# Patient Record
Sex: Male | Born: 1960 | Race: Black or African American | Hispanic: No | Marital: Single | Smoking: Former smoker
Health system: Southern US, Community
[De-identification: ages and names within clinical notes are randomized; demographics above are authoritative.]

## PROBLEM LIST (undated history)

## (undated) DIAGNOSIS — I10 Essential (primary) hypertension: Secondary | ICD-10-CM

## (undated) DIAGNOSIS — A020 Salmonella enteritis: Secondary | ICD-10-CM

## (undated) DIAGNOSIS — C8330 Diffuse large B-cell lymphoma, unspecified site: Secondary | ICD-10-CM

## (undated) DIAGNOSIS — K219 Gastro-esophageal reflux disease without esophagitis: Secondary | ICD-10-CM

## (undated) DIAGNOSIS — R310 Gross hematuria: Secondary | ICD-10-CM

## (undated) DIAGNOSIS — R0600 Dyspnea, unspecified: Secondary | ICD-10-CM

## (undated) DIAGNOSIS — D696 Thrombocytopenia, unspecified: Secondary | ICD-10-CM

## (undated) DIAGNOSIS — R8281 Pyuria: Secondary | ICD-10-CM

## (undated) DIAGNOSIS — Z978 Presence of other specified devices: Secondary | ICD-10-CM

## (undated) DIAGNOSIS — D649 Anemia, unspecified: Secondary | ICD-10-CM

## (undated) DIAGNOSIS — Z95828 Presence of other vascular implants and grafts: Secondary | ICD-10-CM

## (undated) DIAGNOSIS — N368 Other specified disorders of urethra: Secondary | ICD-10-CM

## (undated) HISTORY — PX: PORTA CATH INSERTION: CATH118285

## (undated) HISTORY — DX: Salmonella enteritis: A02.0

## (undated) HISTORY — PX: COLONOSCOPY: SHX174

## (undated) HISTORY — PX: BONE MARROW BIOPSY: SHX199

## (undated) HISTORY — DX: Gastro-esophageal reflux disease without esophagitis: K21.9

## (undated) HISTORY — DX: Essential (primary) hypertension: I10

## (undated) HISTORY — PX: HERNIA REPAIR: SHX51

---

## 2012-04-24 ENCOUNTER — Emergency Department: Payer: Self-pay | Admitting: Emergency Medicine

## 2012-04-24 IMAGING — CR DG ABDOMEN 2V
1 series · 3 of 3 positions shown · non-contrast
Comparison: none

REASON FOR EXAM: CONSTIPATION
COMMENTS:   May transport without cardiac monitor

PROCEDURE:     DXR - DXR ABDOMEN 2 V FLAT AND ERECT  - [DATE]  [DATE]
RESULT:     Comparison: None.

[Series 1: w abdomen upright · 0.14mm/px · 3 of 3 slices shown]
[im 1/3]
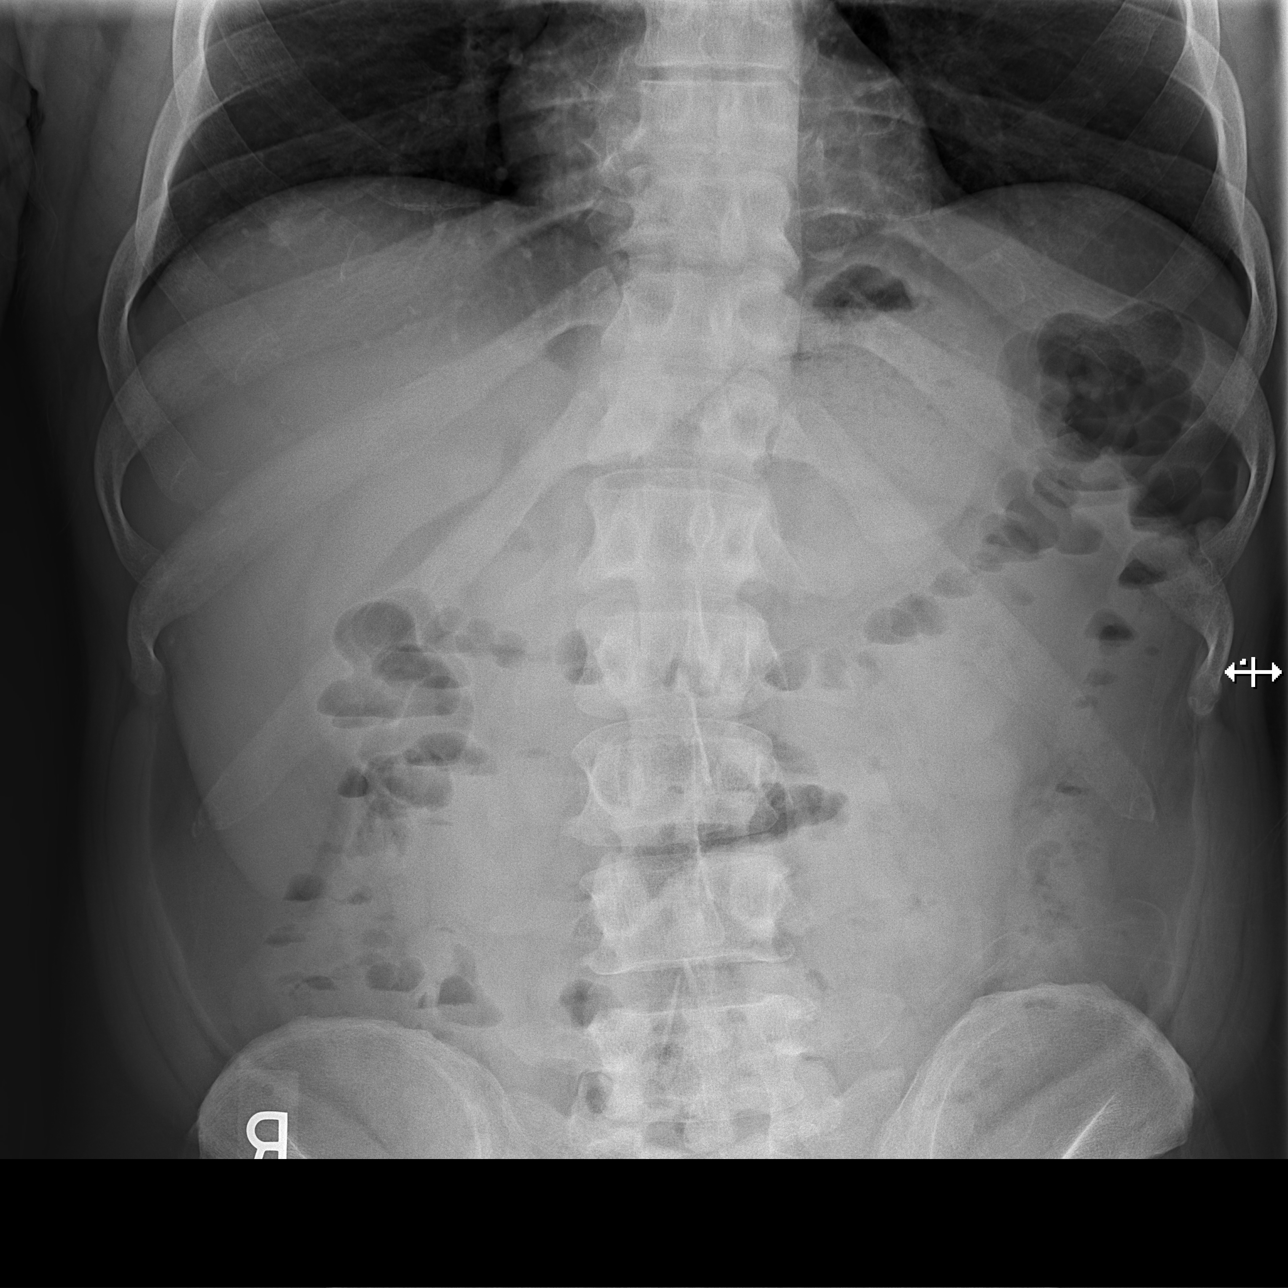
[im 2/3]
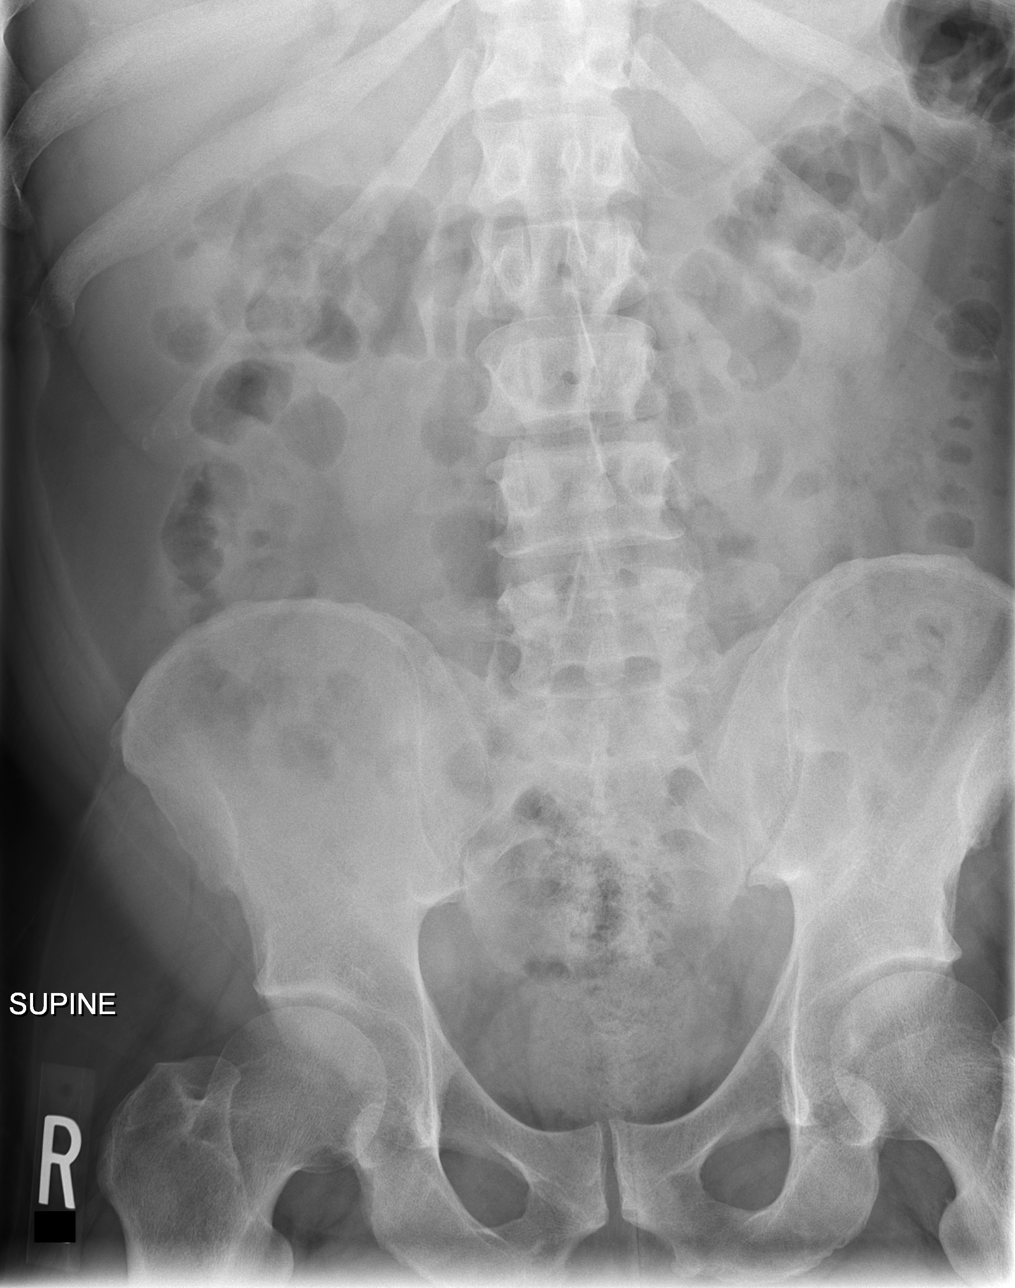
[im 3/3]
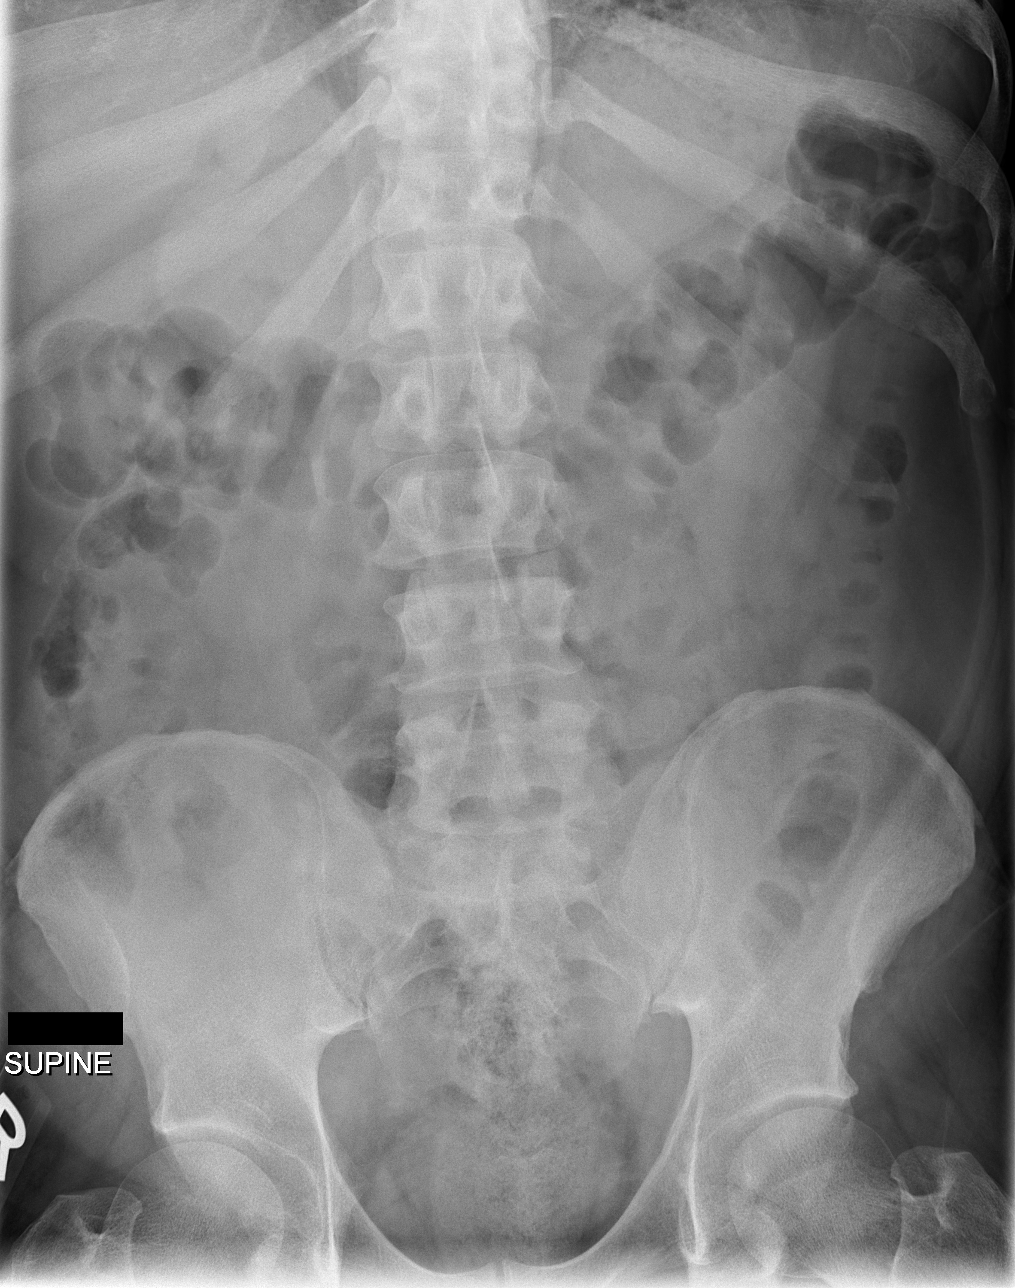

[3 of 3 positions shown; findings below may reference images not displayed]

FINDINGS: No free intraperitoneal. Air seen within nondilated small and large bowel.
Subcentimeter nodular density overlying the right lower lung was seen on the
chest radiograph and felt to represent a nipple shadow.
IMPRESSION: Nonobstructed bowel gas pattern.

[REDACTED]

## 2012-04-24 IMAGING — CR DG CHEST 1V
1 series · 1 of 1 positions shown · non-contrast
Comparison: none

REASON FOR EXAM: UPRIGHT, CONSTIPATION
COMMENTS:   May transport without cardiac monitor

PROCEDURE:     DXR - DXR CHEST 1 VIEWAP OR PA  - [DATE]  [DATE]
RESULT:     Comparison: None.

[w chest pa]
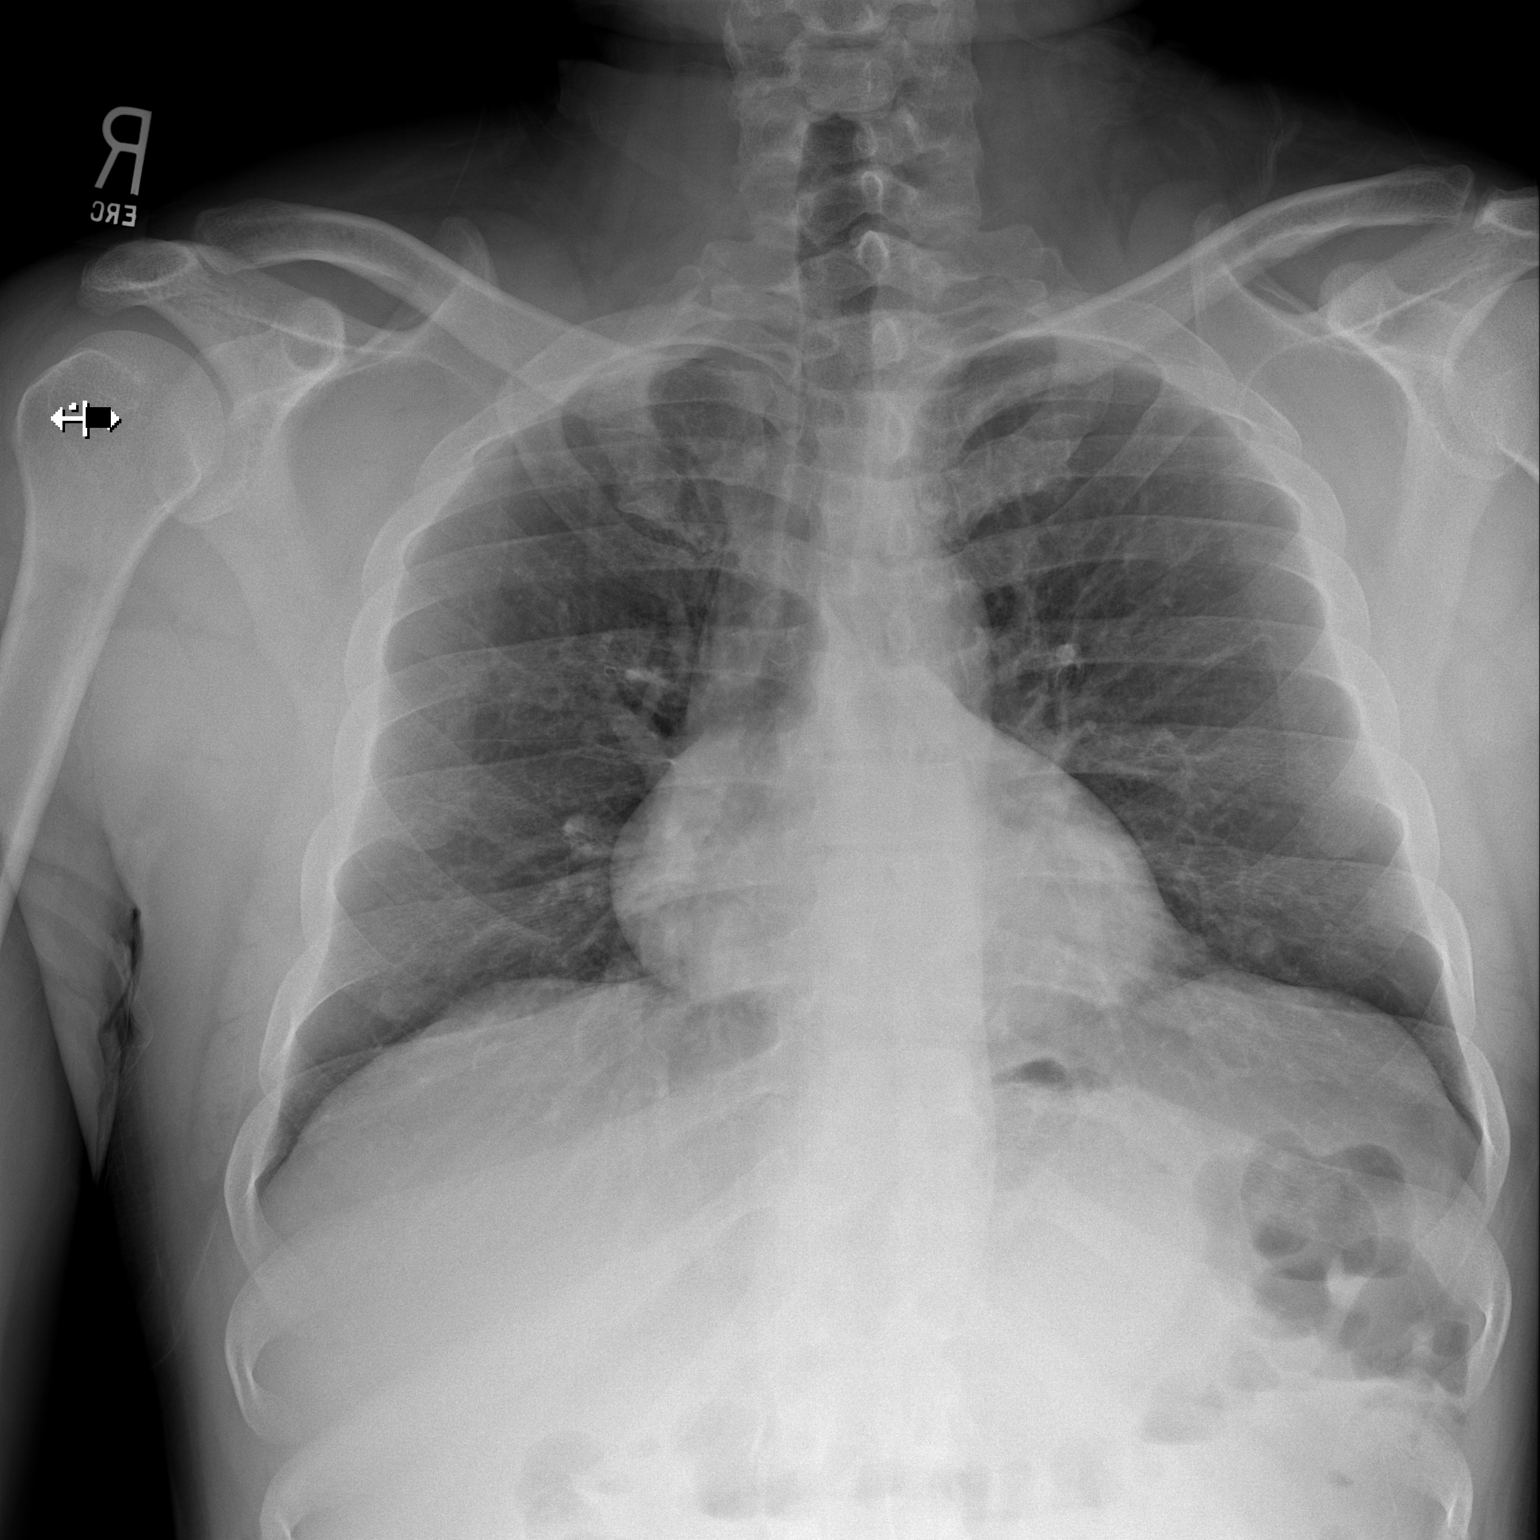

[1 of 1 positions shown; findings below may reference images not displayed]

FINDINGS: The heart and mediastinum are within normal limits, given the lower lung
volumes. Subcentimeter nodular densities overlying the lower lungs are
likely secondary to nipple shadows. No focal pulmonary opacities.
IMPRESSION: No acute cardiopulmonary disease.

[REDACTED]

## 2013-02-19 ENCOUNTER — Emergency Department: Payer: Self-pay

## 2013-02-19 LAB — COMPREHENSIVE METABOLIC PANEL
Albumin: 4.2 g/dL (ref 3.4–5.0)
Alkaline Phosphatase: 79 U/L
Anion Gap: 6 — ABNORMAL LOW (ref 7–16)
BUN: 16 mg/dL (ref 7–18)
Bilirubin,Total: 0.4 mg/dL (ref 0.2–1.0)
Calcium, Total: 9.6 mg/dL (ref 8.5–10.1)
Chloride: 102 mmol/L (ref 98–107)
Co2: 27 mmol/L (ref 21–32)
Creatinine: 1.12 mg/dL (ref 0.60–1.30)
EGFR (African American): >60
EGFR (Non-African Amer.): >60
Glucose: 94 mg/dL (ref 65–99)
Osmolality: 271 (ref 275–301)
Potassium: 3.5 mmol/L (ref 3.5–5.1)
SGOT(AST): 33 U/L (ref 15–37)
SGPT (ALT): 41 U/L (ref 12–78)
Sodium: 135 mmol/L — ABNORMAL LOW (ref 136–145)
Total Protein: 7.9 g/dL (ref 6.4–8.2)

## 2013-02-19 LAB — CBC
HCT: 40.1 % (ref 40.0–52.0)
HGB: 13.9 g/dL (ref 13.0–18.0)
MCH: 29.9 pg (ref 26.0–34.0)
MCHC: 34.8 g/dL (ref 32.0–36.0)
MCV: 86 fL (ref 80–100)
Platelet: 274 10*3/uL (ref 150–440)
RBC: 4.66 10*6/uL (ref 4.40–5.90)
RDW: 14 % (ref 11.5–14.5)
WBC: 7 10*3/uL (ref 3.8–10.6)

## 2013-02-19 LAB — TROPONIN I: Troponin-I: <0.02 ng/mL

## 2013-02-19 IMAGING — CR DG CHEST 2V
1 series · 3 of 3 positions shown · non-contrast
Comparison: None.

CLINICAL DATA: Chest pain

EXAM:
CHEST  2 VIEW

[Series 1: w chest pa · 0.14mm/px · 3 of 3 slices shown]
[im 1/3]
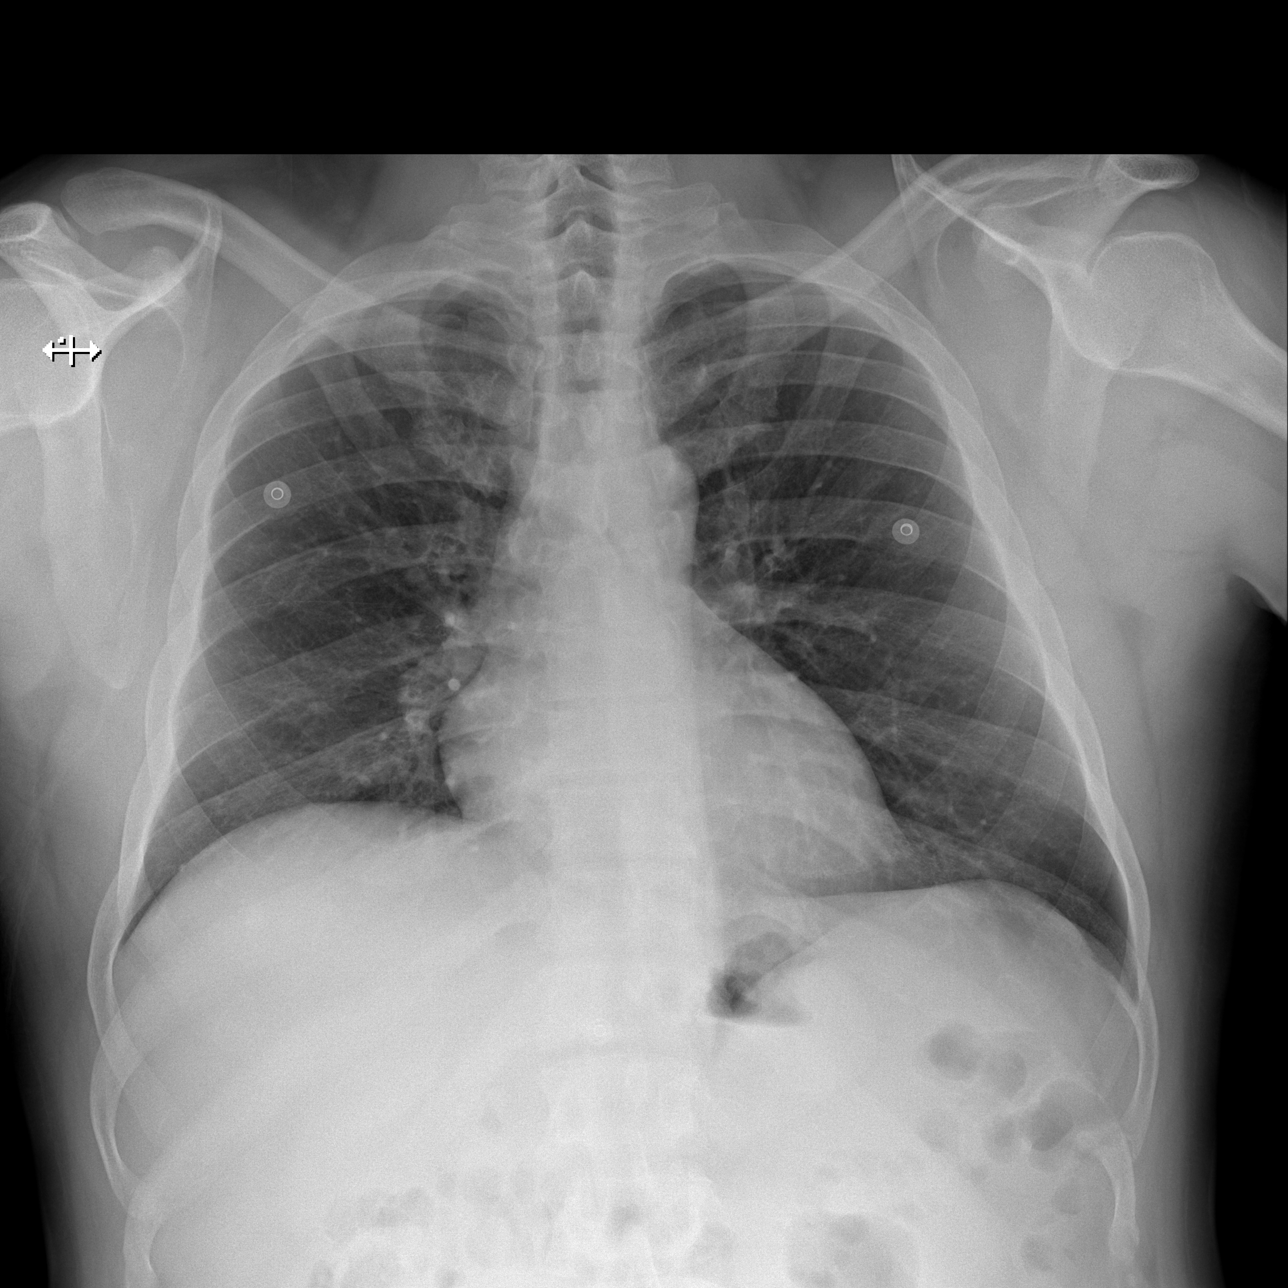
[im 2/3]
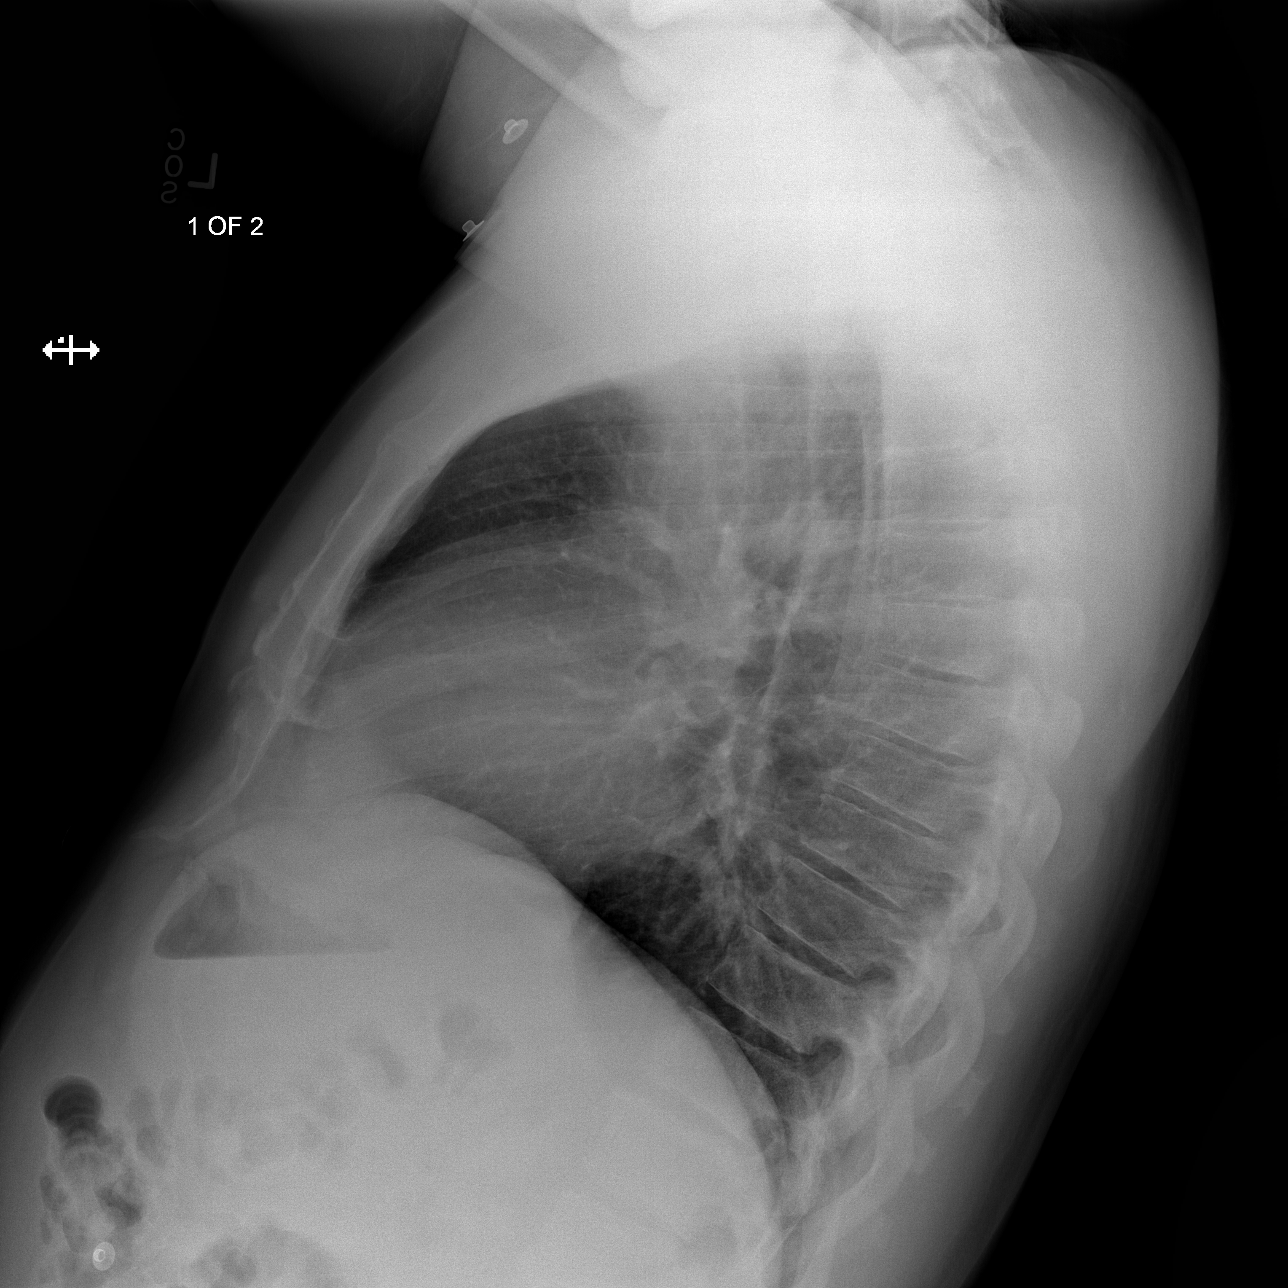
[im 3/3]
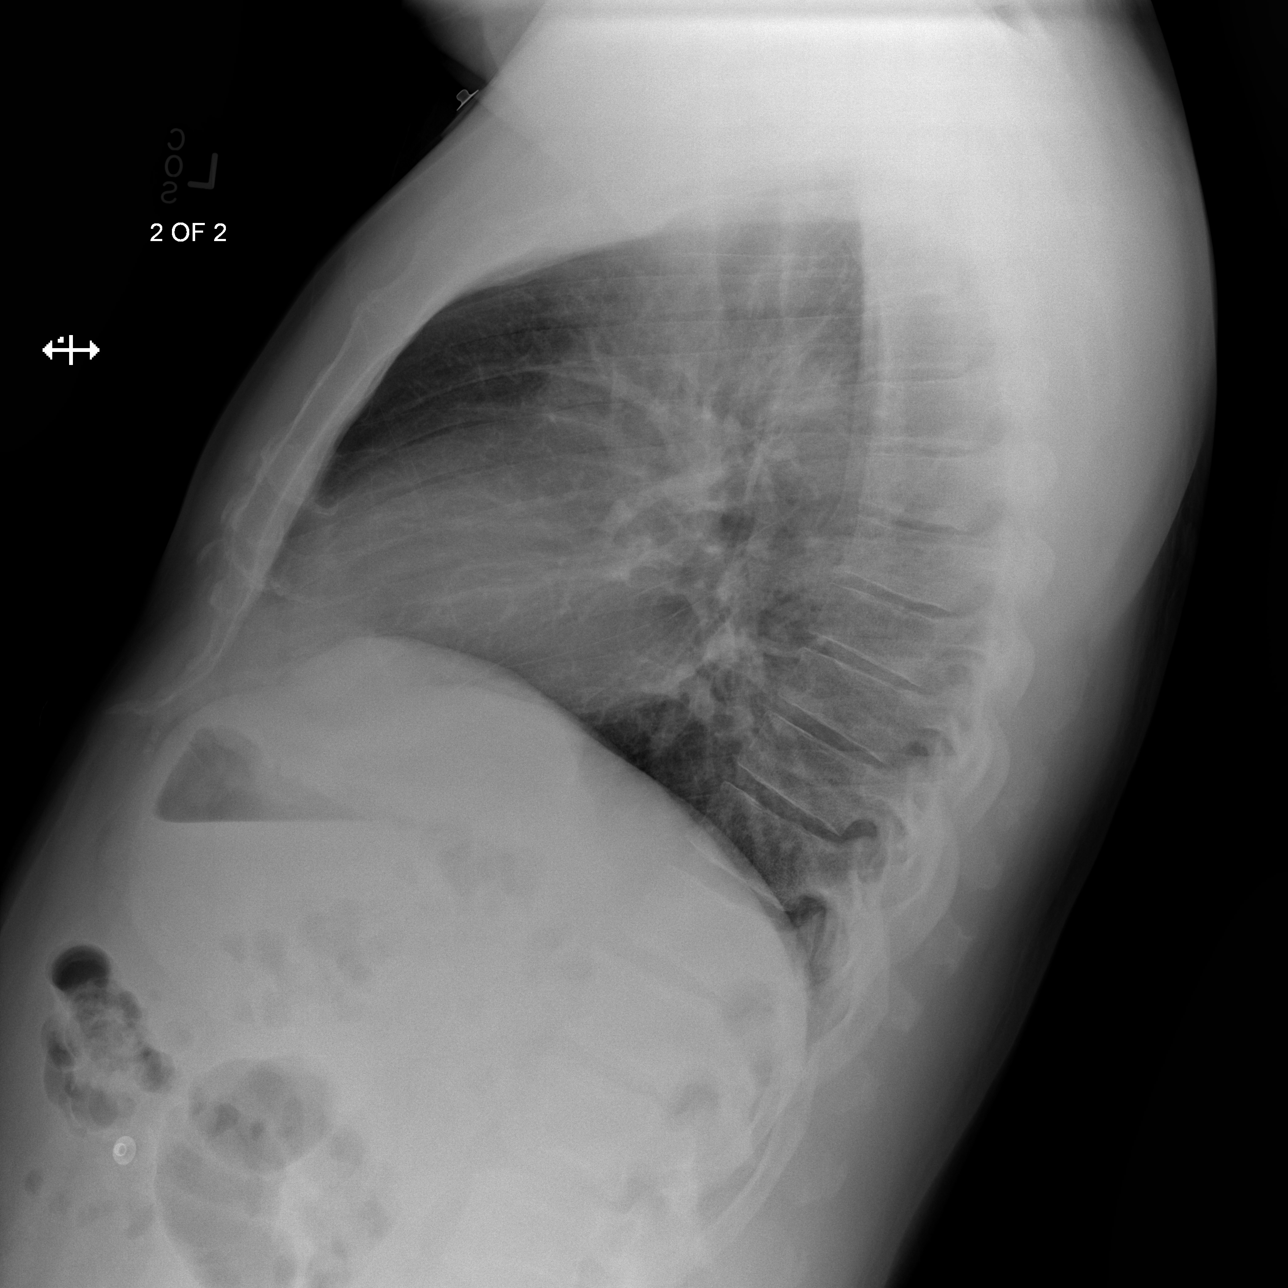

[3 of 3 positions shown; findings below may reference images not displayed]

FINDINGS: The heart size and mediastinal contours are within normal limits.
Both lungs are clear. The visualized skeletal structures are
unremarkable. Low lung volumes.
IMPRESSION: No active cardiopulmonary disease.

## 2017-05-21 ENCOUNTER — Other Ambulatory Visit: Payer: Self-pay

## 2017-05-21 ENCOUNTER — Emergency Department
Admission: EM | Admit: 2017-05-21 | Discharge: 2017-05-21 | Disposition: A | Discharge status: Home / Self Care | Payer: Self-pay | Attending: Emergency Medicine | Admitting: Emergency Medicine

## 2017-05-21 ENCOUNTER — Emergency Department: Payer: Self-pay

## 2017-05-21 DIAGNOSIS — R0789 Other chest pain: Secondary | ICD-10-CM

## 2017-05-21 DIAGNOSIS — R079 Chest pain, unspecified: Secondary | ICD-10-CM | POA: Insufficient documentation

## 2017-05-21 LAB — CBC
HCT: 39.9 % — ABNORMAL LOW (ref 40.0–52.0)
Hemoglobin: 14 g/dL (ref 13.0–18.0)
MCH: 31.1 pg (ref 26.0–34.0)
MCHC: 35 g/dL (ref 32.0–36.0)
MCV: 88.7 fL (ref 80.0–100.0)
Platelets: 269 10*3/uL (ref 150–440)
RBC: 4.5 MIL/uL (ref 4.40–5.90)
RDW: 13.7 % (ref 11.5–14.5)
WBC: 5.1 10*3/uL (ref 3.8–10.6)

## 2017-05-21 LAB — BASIC METABOLIC PANEL
Anion gap: 10 (ref 5–15)
BUN: 14 mg/dL (ref 6–20)
CO2: 24 mmol/L (ref 22–32)
Calcium: 9.6 mg/dL (ref 8.9–10.3)
Chloride: 105 mmol/L (ref 101–111)
Creatinine, Ser: 1.16 mg/dL (ref 0.61–1.24)
GFR calc Af Amer: >60 mL/min (ref >60)
GFR calc non Af Amer: >60 mL/min (ref >60)
Glucose, Bld: 137 mg/dL — ABNORMAL HIGH (ref 65–99)
Potassium: 3.6 mmol/L (ref 3.5–5.1)
Sodium: 139 mmol/L (ref 135–145)

## 2017-05-21 LAB — HEPATIC FUNCTION PANEL
ALT: 33 U/L (ref 17–63)
AST: 26 U/L (ref 15–41)
Albumin: 4.3 g/dL (ref 3.5–5.0)
Alkaline Phosphatase: 74 U/L (ref 38–126)
Bilirubin, Direct: <0.1 mg/dL — ABNORMAL LOW (ref 0.1–0.5)
Total Bilirubin: 0.5 mg/dL (ref 0.3–1.2)
Total Protein: 7.2 g/dL (ref 6.5–8.1)

## 2017-05-21 LAB — TROPONIN I
Troponin I: <0.03 ng/mL (ref <0.03)
Troponin I: <0.03 ng/mL (ref <0.03)

## 2017-05-21 LAB — LIPASE, BLOOD: Lipase: 44 U/L (ref 11–51)

## 2017-05-21 IMAGING — CR DG CHEST 2V
1 series · 2 of 2 positions shown · non-contrast
Comparison: [DATE]

CLINICAL DATA: Smoker with chest pain

EXAM:
CHEST - 2 VIEW

[Series 1: dg chest 2 view · 0.14mm/px · 2 of 2 slices shown]
[im 1/2]
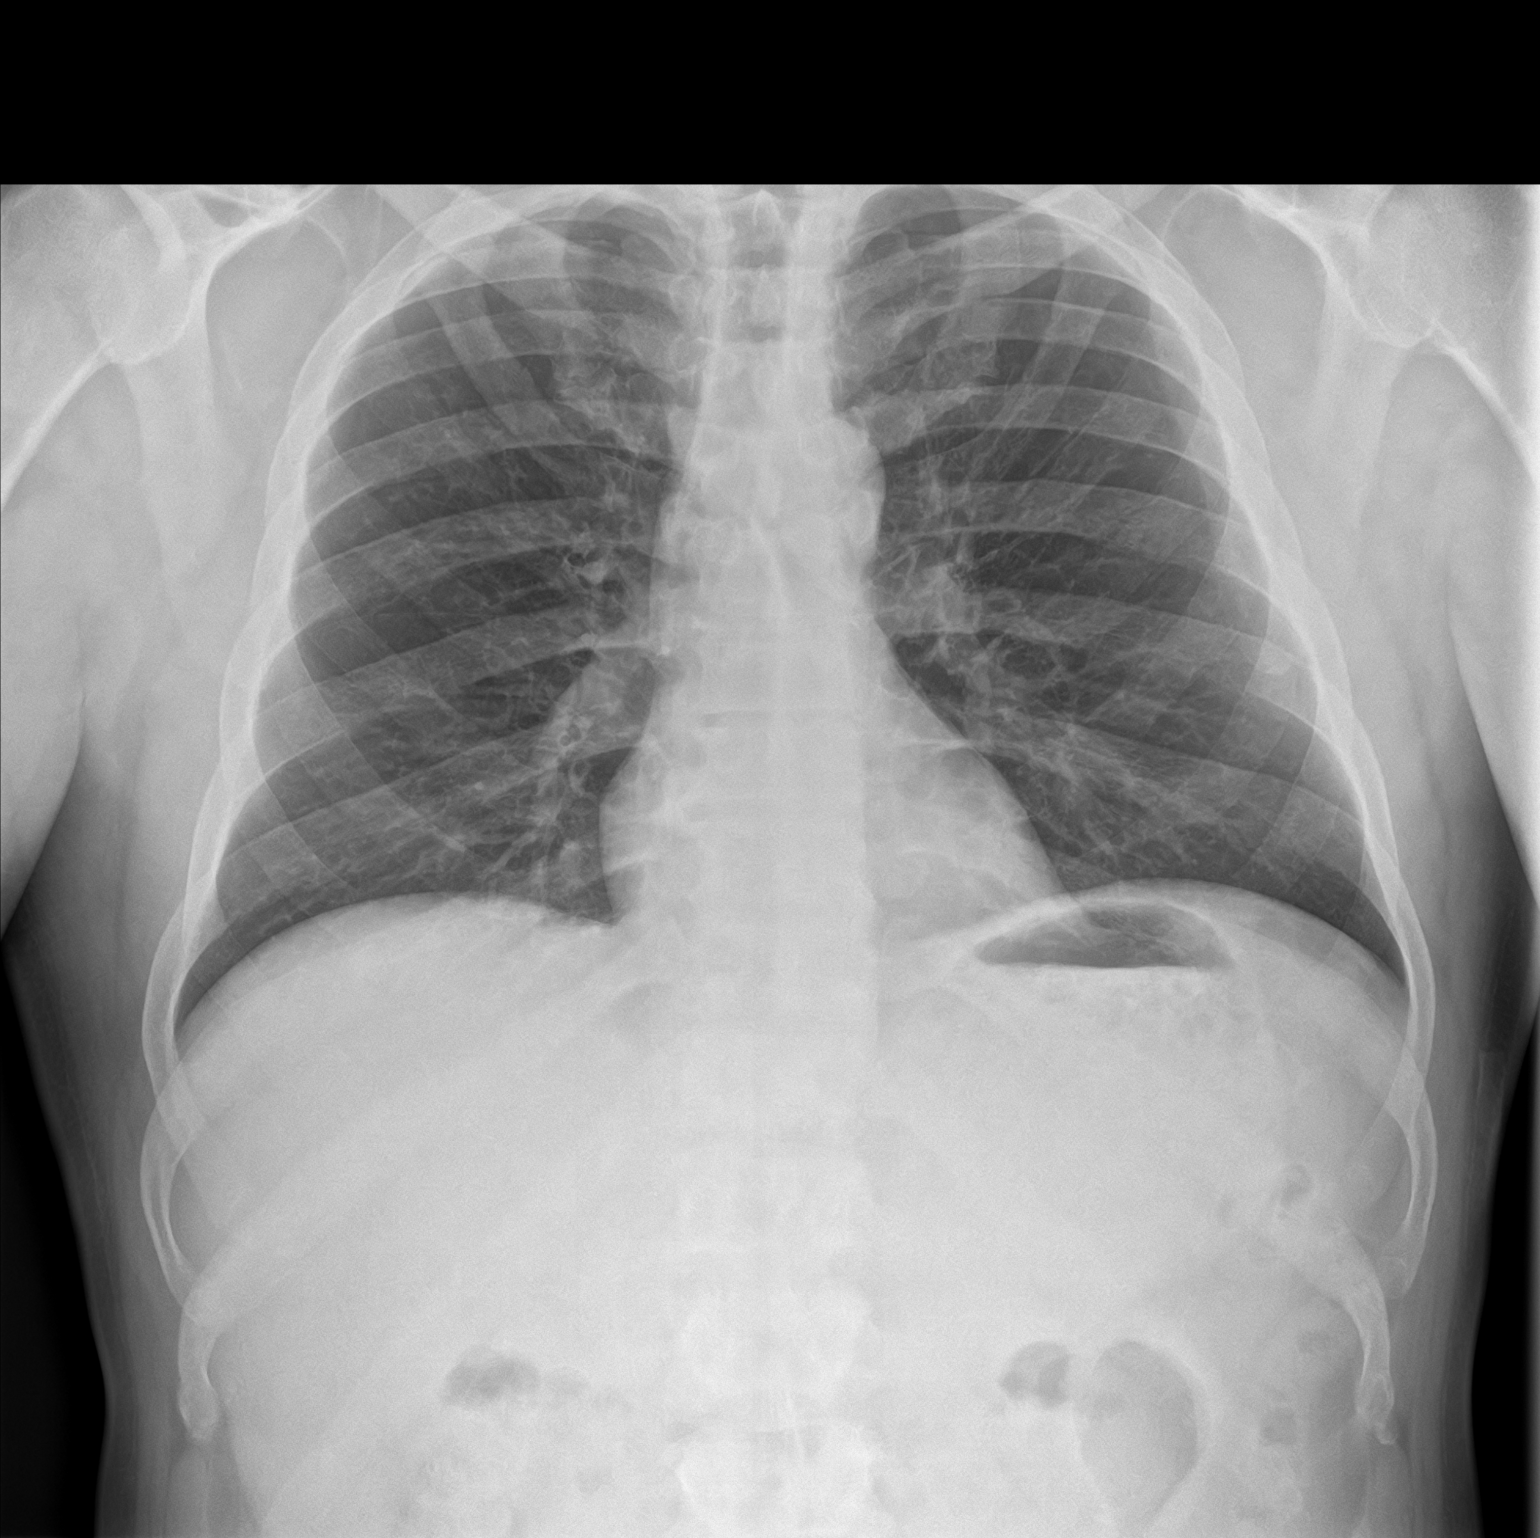
[im 2/2]
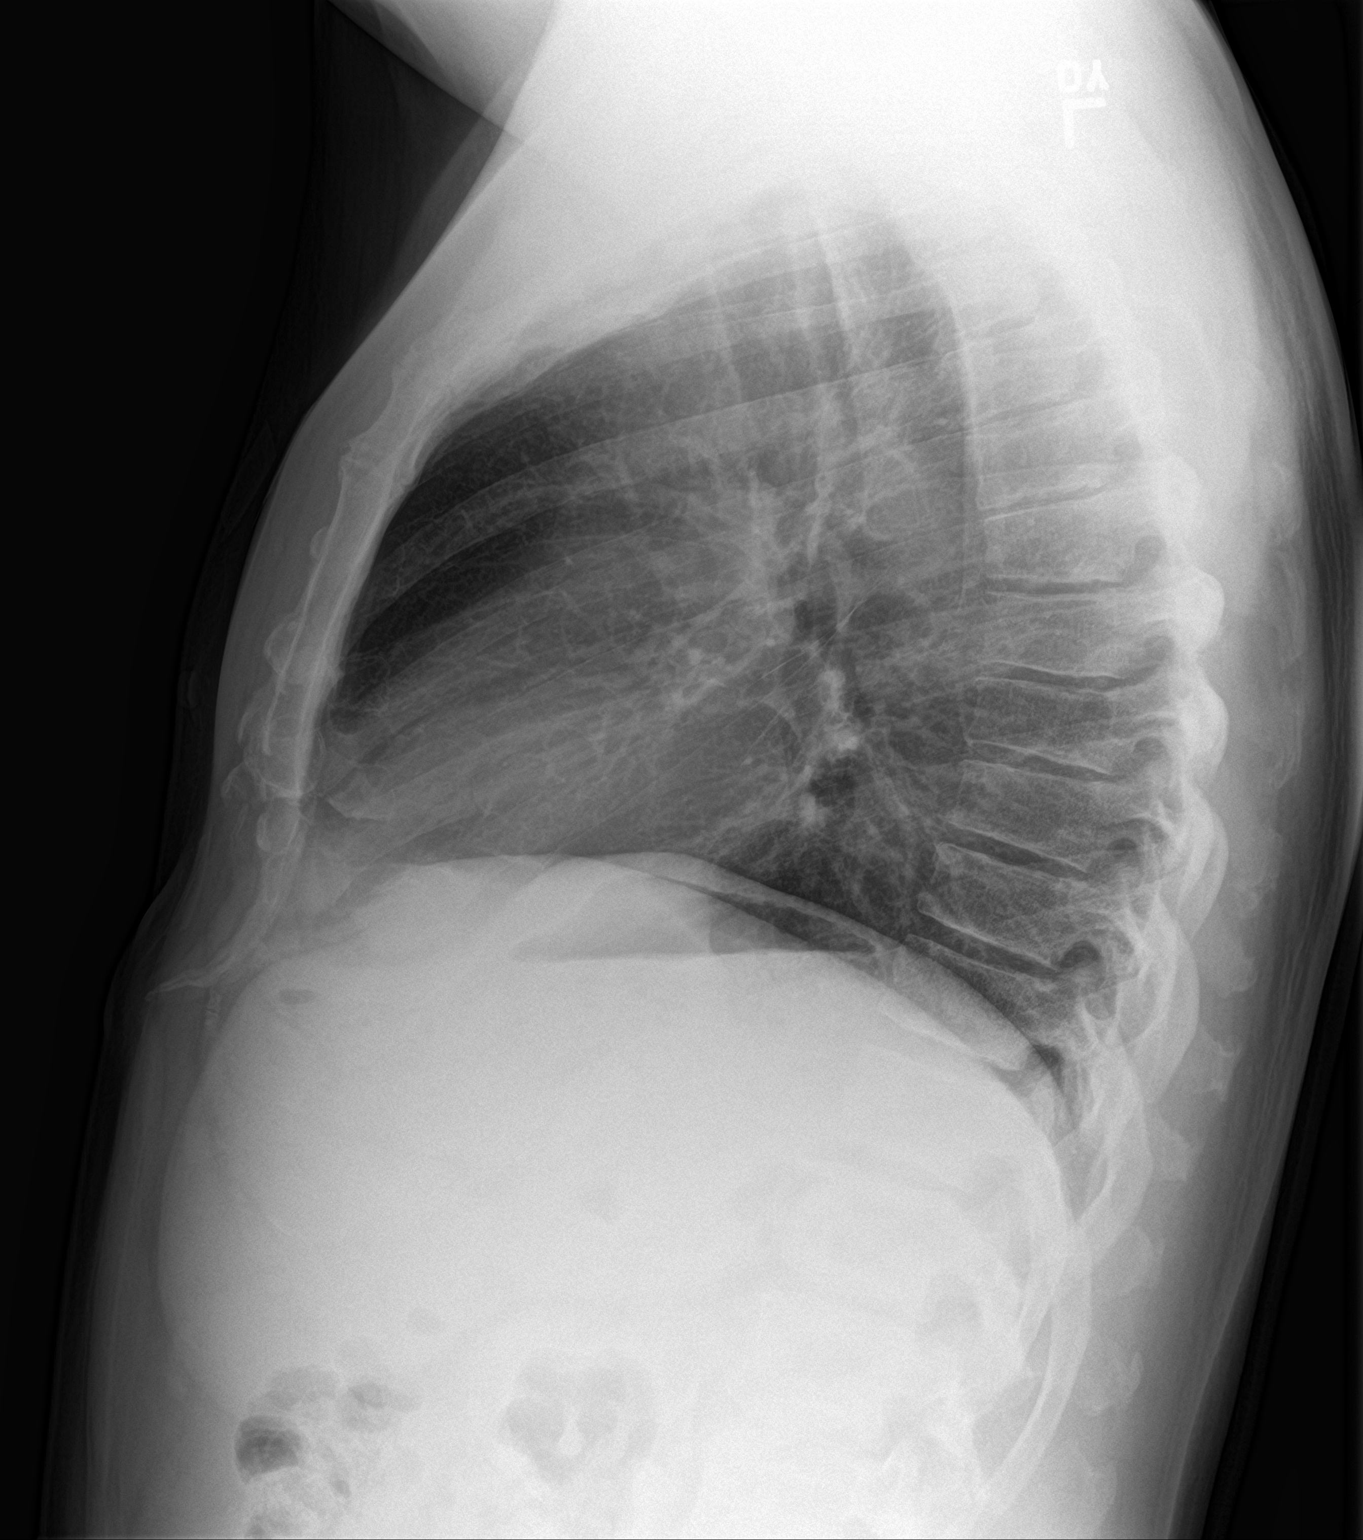

[2 of 2 positions shown; findings below may reference images not displayed]

FINDINGS: The heart size and mediastinal contours are within normal limits.
Both lungs are clear. The visualized skeletal structures are
unremarkable.
IMPRESSION: No active cardiopulmonary disease.

## 2017-05-21 NOTE — ED Provider Notes (Signed)
[facility] Emergency Department Provider Note  ____________________________________________   I have reviewed the triage vital signs and the nursing notes. Where available I have reviewed prior notes and, if possible and indicated, outside hospital notes.    HISTORY  Chief Complaint Chest Pain    HPI Christopher Mills is a 57 y.o. male he does not have any known risk factors for CAD he states does drink a fair amount of alcohol on the weekends but never had any withdrawal.  He states he has been having a burning discomfort in his chest off and on for a month.  Nothing makes it better except for antiacids is worse when he drinks alcohol or when he eats spicy food.  He states he has had no fever or chills, no exertional symptoms, he has not had any radiation of the discomfort.  It all started when he had a cough about a month ago, the cough is gone.  He does not have any pleuritic pain.  He states now and again he has a discomfort there that gets better when he takes acid reflux medication.  And seems to get worse when he eats spicy food.  He is not actually having the pain now he has a day tonight but he wants to be checked out.  He denies any leg swelling, he denies any orthopnea or paroxysmal nocturnal dyspnea, he denies any decreased ability to do his activities of daily living or exertional symptoms of any variety.  Patient is a very limited historian in some ways, this is the best that I can gather from him.  The pain is nonradiating, he has no personal or family history of PE or DVT no recent travel no recent surgeries.   No past medical history on file.    Prior to Admission medications   Not on File    Allergies Patient has no known allergies.  No family history on file.  Social History Social History   Tobacco Use  . Smoking status: Not on file  Substance Use Topics  . Alcohol use: Not on file  . Drug use: Not on file    Review of  Systems Constitutional: No fever/chills Eyes: No visual changes. ENT: No sore throat. No stiff neck no neck pain Cardiovascular: See HPI chest pain. Respiratory: Denies shortness of breath. Gastrointestinal:   no vomiting.  No diarrhea.  No constipation. Genitourinary: Negative for dysuria. Musculoskeletal: Negative lower extremity swelling Skin: Negative for rash. Neurological: Negative for severe headaches, focal weakness or numbness.   ____________________________________________   PHYSICAL EXAM:  VITAL SIGNS: ED Triage Vitals  Enc Vitals Group     BP 05/21/17 1728 125/77     Pulse Rate 05/21/17 1728 84     Resp 05/21/17 1728 18     Temp 05/21/17 1728 98.6 F (37 C)     Temp Source 05/21/17 1728 Oral     SpO2 05/21/17 1728 99 %     Weight 05/21/17 1729 187 lb (84.8 kg)     Height 05/21/17 1729 6' (1.829 m)     Head Circumference --      Peak Flow --      Pain Score 05/21/17 1728 7     Pain Loc --      Pain Edu? --      Excl. in [place]? --     Constitutional: Alert and oriented. Well appearing and in no acute distress. Eyes: Conjunctivae are normal Head: Atraumatic HEENT: No congestion/rhinnorhea. Mucous  membranes are moist.  Oropharynx non-erythematous Neck:   Nontender with no meningismus, no masses, no stridor Cardiovascular: Normal rate, regular rhythm. Grossly normal heart sounds.  Good peripheral circulation. Respiratory: Normal respiratory effort.  No retractions. Lungs CTAB. Abdominal: Soft and nontender. No distention. No guarding no rebound Back:  There is no focal tenderness or step off.  there is no midline tenderness there are no lesions noted. there is no CVA tenderness Musculoskeletal: No lower extremity tenderness, no upper extremity tenderness. No joint effusions, no DVT signs strong distal pulses no edema Neurologic:  Normal speech and language. No gross focal neurologic deficits are appreciated.  Skin:  Skin is warm, dry and intact. No rash  noted. Psychiatric: Mood and affect are normal. Speech and behavior are normal.  ____________________________________________   LABS (all labs ordered are listed, but only abnormal results are displayed)  Labs Reviewed  BASIC METABOLIC PANEL - Abnormal; Notable for the following components:      Result Value   Glucose, Bld 137 (*)    All other components within normal limits  CBC - Abnormal; Notable for the following components:   HCT 39.9 (*)    All other components within normal limits  HEPATIC FUNCTION PANEL - Abnormal; Notable for the following components:   Bilirubin, Direct <0.1 (*)    All other components within normal limits  TROPONIN I  LIPASE, BLOOD  TROPONIN I    Pertinent labs  results that were available during my care of the patient were reviewed by me and considered in my medical decision making (see chart for details). ____________________________________________  EKG  I personally interpreted any EKGs ordered by me or triage Normal sinus rhythm at 72 bpm no acute ST elevation or depression, nonspecific ST changes, normal axis, borderline LAD, no acute ischemia ____________________________________________  RADIOLOGY  Pertinent labs & imaging results that were available during my care of the patient were reviewed by me and considered in my medical decision making (see chart for details). If possible, patient and/or family made aware of any abnormal findings.  Dg Chest 2 View  Result Date: 05/21/2017 CLINICAL DATA:  Smoker with chest pain EXAM: CHEST - 2 VIEW COMPARISON:  02/19/2013 FINDINGS: The heart size and mediastinal contours are within normal limits. Both lungs are clear. The visualized skeletal structures are unremarkable. IMPRESSION: No active cardiopulmonary disease. Electronically Signed   By: Donavan Foil M.D.   On: 05/21/2017 17:56   ____________________________________________    PROCEDURES  Procedure(s) performed:  None  Procedures  Critical Care performed: None  ____________________________________________   INITIAL IMPRESSION / ASSESSMENT AND PLAN / ED COURSE  Pertinent labs & imaging results that were available during my care of the patient were reviewed by me and considered in my medical decision making (see chart for details).  She was very poorly described and very atypical discomfort associate with spicy food better with antiacids, nonetheless I still do 2 sets of cardiac markers which are negative he is pain-free while he is here, EKG is reassuring blood work is reassuring, we have discussed admission but patient has a date tonight and very much wants to go home we will discharge him therefore with close outpatient follow-up.  Return precautions and follow-up given and understood.  At this time, there does not appear to be clinical evidence to support the diagnosis of pulmonary embolus, dissection, myocarditis, endocarditis, pericarditis, pericardial tamponade, acute coronary syndrome, pneumothorax, pneumonia, or any other acute intrathoracic pathology that will require admission or acute intervention. Nor  is there evidence of any significant intra-abdominal pathology causing this discomfort.    ____________________________________________   FINAL CLINICAL IMPRESSION(S) / ED DIAGNOSES  Final diagnoses:  Chest pain, unspecified type      This chart was dictated using voice recognition software.  Despite best efforts to proofread,  errors can occur which can change meaning.      Schuyler Amor, MD 05/21/17 2230

## 2017-05-21 NOTE — ED Triage Notes (Signed)
Patient reports left chest pain (non radiating) and reports pain worse with movement.  Reports symptoms for "couple weeks".

## 2017-05-21 NOTE — Discharge Instructions (Signed)
At this time, we cannot definitively say that there is nothing wrong with her heart but we are very reassured by your findings.  You would prefer not to stay in the hospital which is your choice, it does mean that you needed to be very vigilant about following closely with your primary care doctor as well as with cardiology and returning to the emergency room if you have any new or worrisome symptoms.  If you have increased chest pain shortness of breath or other concerns please come right back.  Otherwise follow closely.

## 2017-05-21 NOTE — ED Notes (Signed)
E-signature pad not working. Pt signed physical copy of discharge; placed in medical records bin.

## 2019-10-21 ENCOUNTER — Other Ambulatory Visit: Payer: Self-pay

## 2019-10-21 ENCOUNTER — Emergency Department: Payer: Self-pay

## 2019-10-21 DIAGNOSIS — Z5321 Procedure and treatment not carried out due to patient leaving prior to being seen by health care provider: Secondary | ICD-10-CM | POA: Insufficient documentation

## 2019-10-21 DIAGNOSIS — R079 Chest pain, unspecified: Secondary | ICD-10-CM | POA: Insufficient documentation

## 2019-10-21 DIAGNOSIS — R42 Dizziness and giddiness: Secondary | ICD-10-CM | POA: Insufficient documentation

## 2019-10-21 LAB — BASIC METABOLIC PANEL
Anion gap: 10 (ref 5–15)
BUN: 17 mg/dL (ref 6–20)
CO2: 24 mmol/L (ref 22–32)
Calcium: 9.2 mg/dL (ref 8.9–10.3)
Chloride: 104 mmol/L (ref 98–111)
Creatinine, Ser: 1.43 mg/dL — ABNORMAL HIGH (ref 0.61–1.24)
GFR calc Af Amer: >60 mL/min (ref >60)
GFR calc non Af Amer: 53 mL/min — ABNORMAL LOW (ref >60)
Glucose, Bld: 119 mg/dL — ABNORMAL HIGH (ref 70–99)
Potassium: 3.6 mmol/L (ref 3.5–5.1)
Sodium: 138 mmol/L (ref 135–145)

## 2019-10-21 LAB — CBC
HCT: 39.3 % (ref 39.0–52.0)
Hemoglobin: 13.6 g/dL (ref 13.0–17.0)
MCH: 30.4 pg (ref 26.0–34.0)
MCHC: 34.6 g/dL (ref 30.0–36.0)
MCV: 87.7 fL (ref 80.0–100.0)
Platelets: 246 10*3/uL (ref 150–400)
RBC: 4.48 MIL/uL (ref 4.22–5.81)
RDW: 13.4 % (ref 11.5–15.5)
WBC: 5.8 10*3/uL (ref 4.0–10.5)
nRBC: 0 % (ref 0.0–0.2)

## 2019-10-21 LAB — TROPONIN I (HIGH SENSITIVITY): Troponin I (High Sensitivity): 4 ng/L (ref <18)

## 2019-10-21 IMAGING — CR DG CHEST 2V
1 series · 2 of 2 positions shown · non-contrast
Comparison: [DATE]

CLINICAL DATA: Chest pain.

EXAM:
CHEST - 2 VIEW

[Series 1: dg chest 2 view · 0.14mm/px · 2 of 2 slices shown]
[im 1/2]
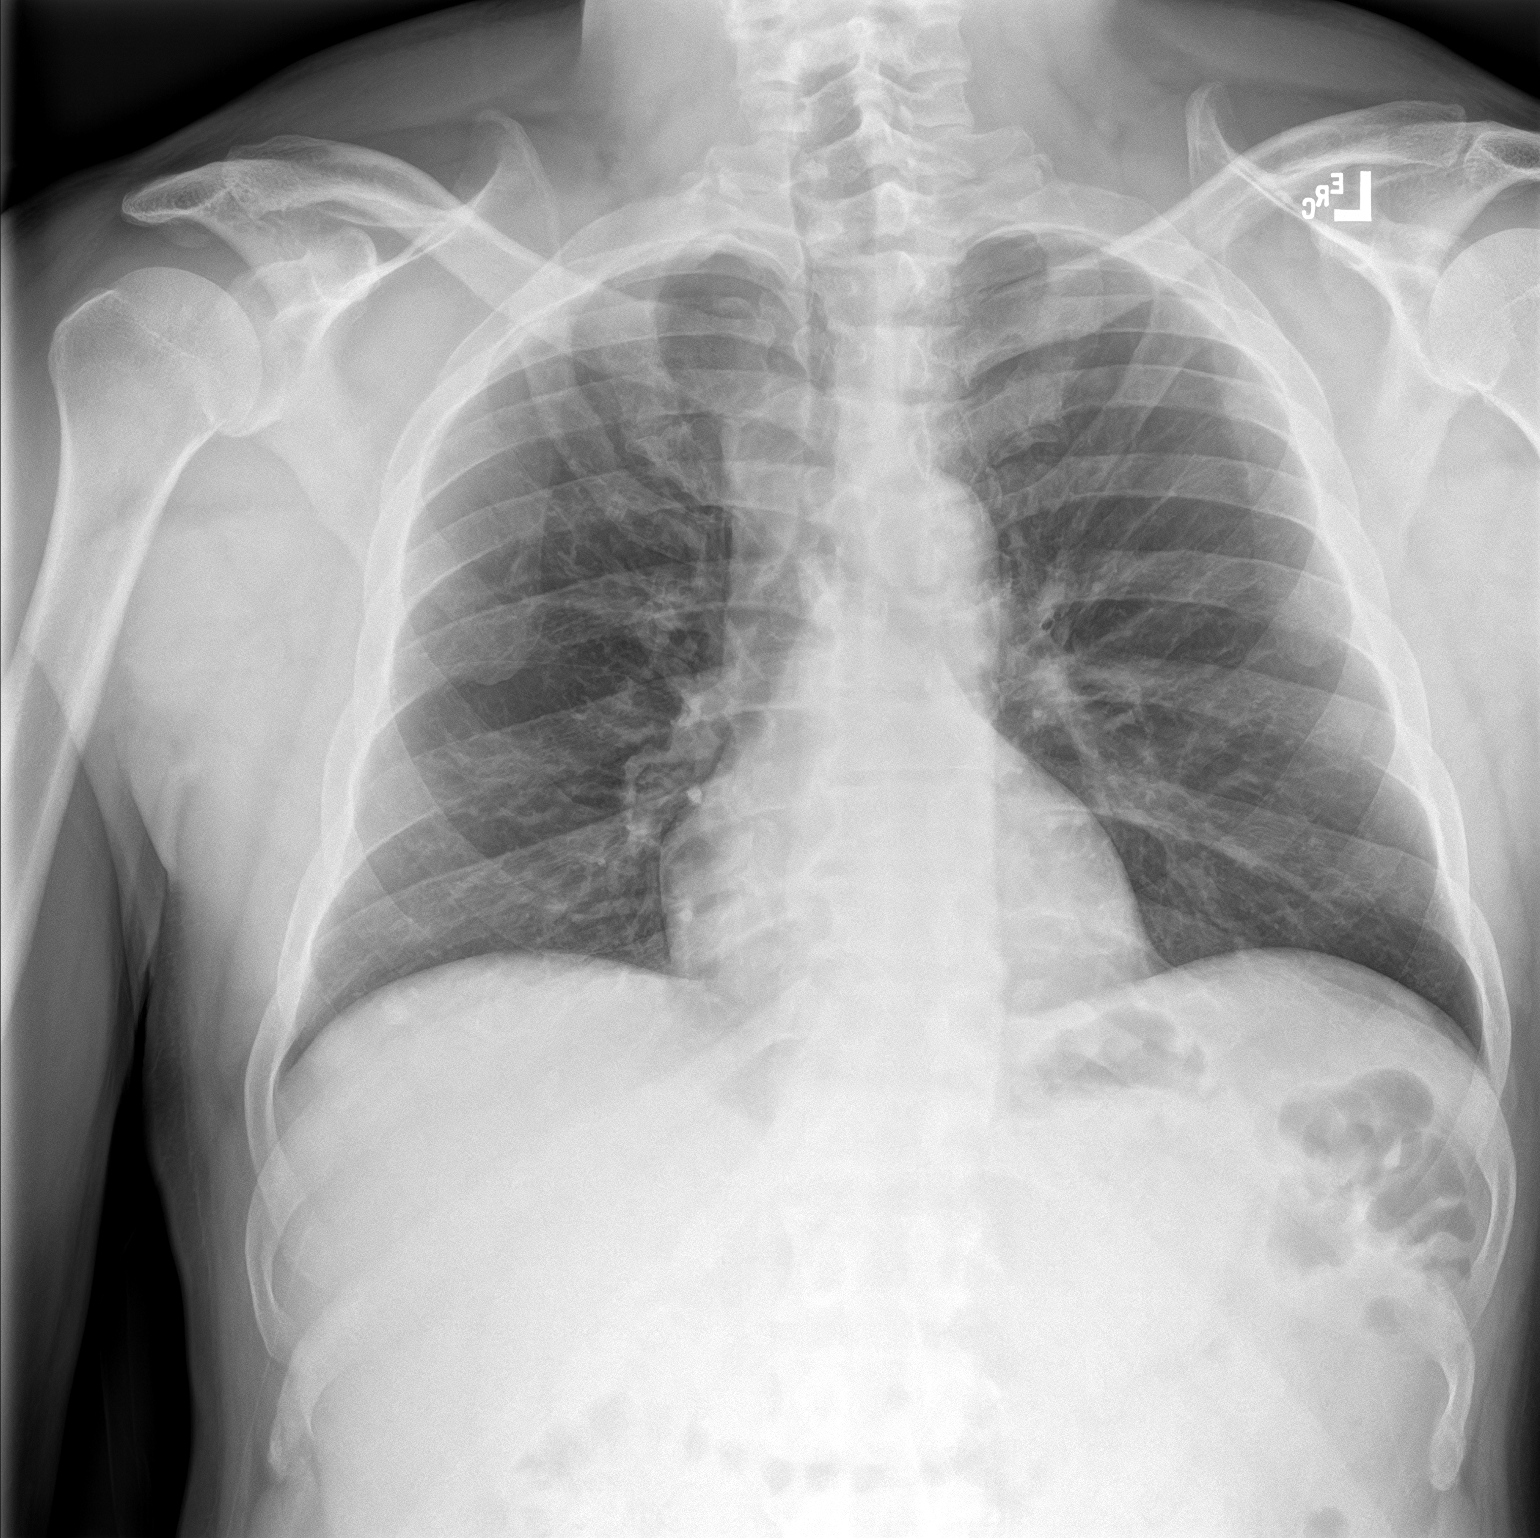
[im 2/2]
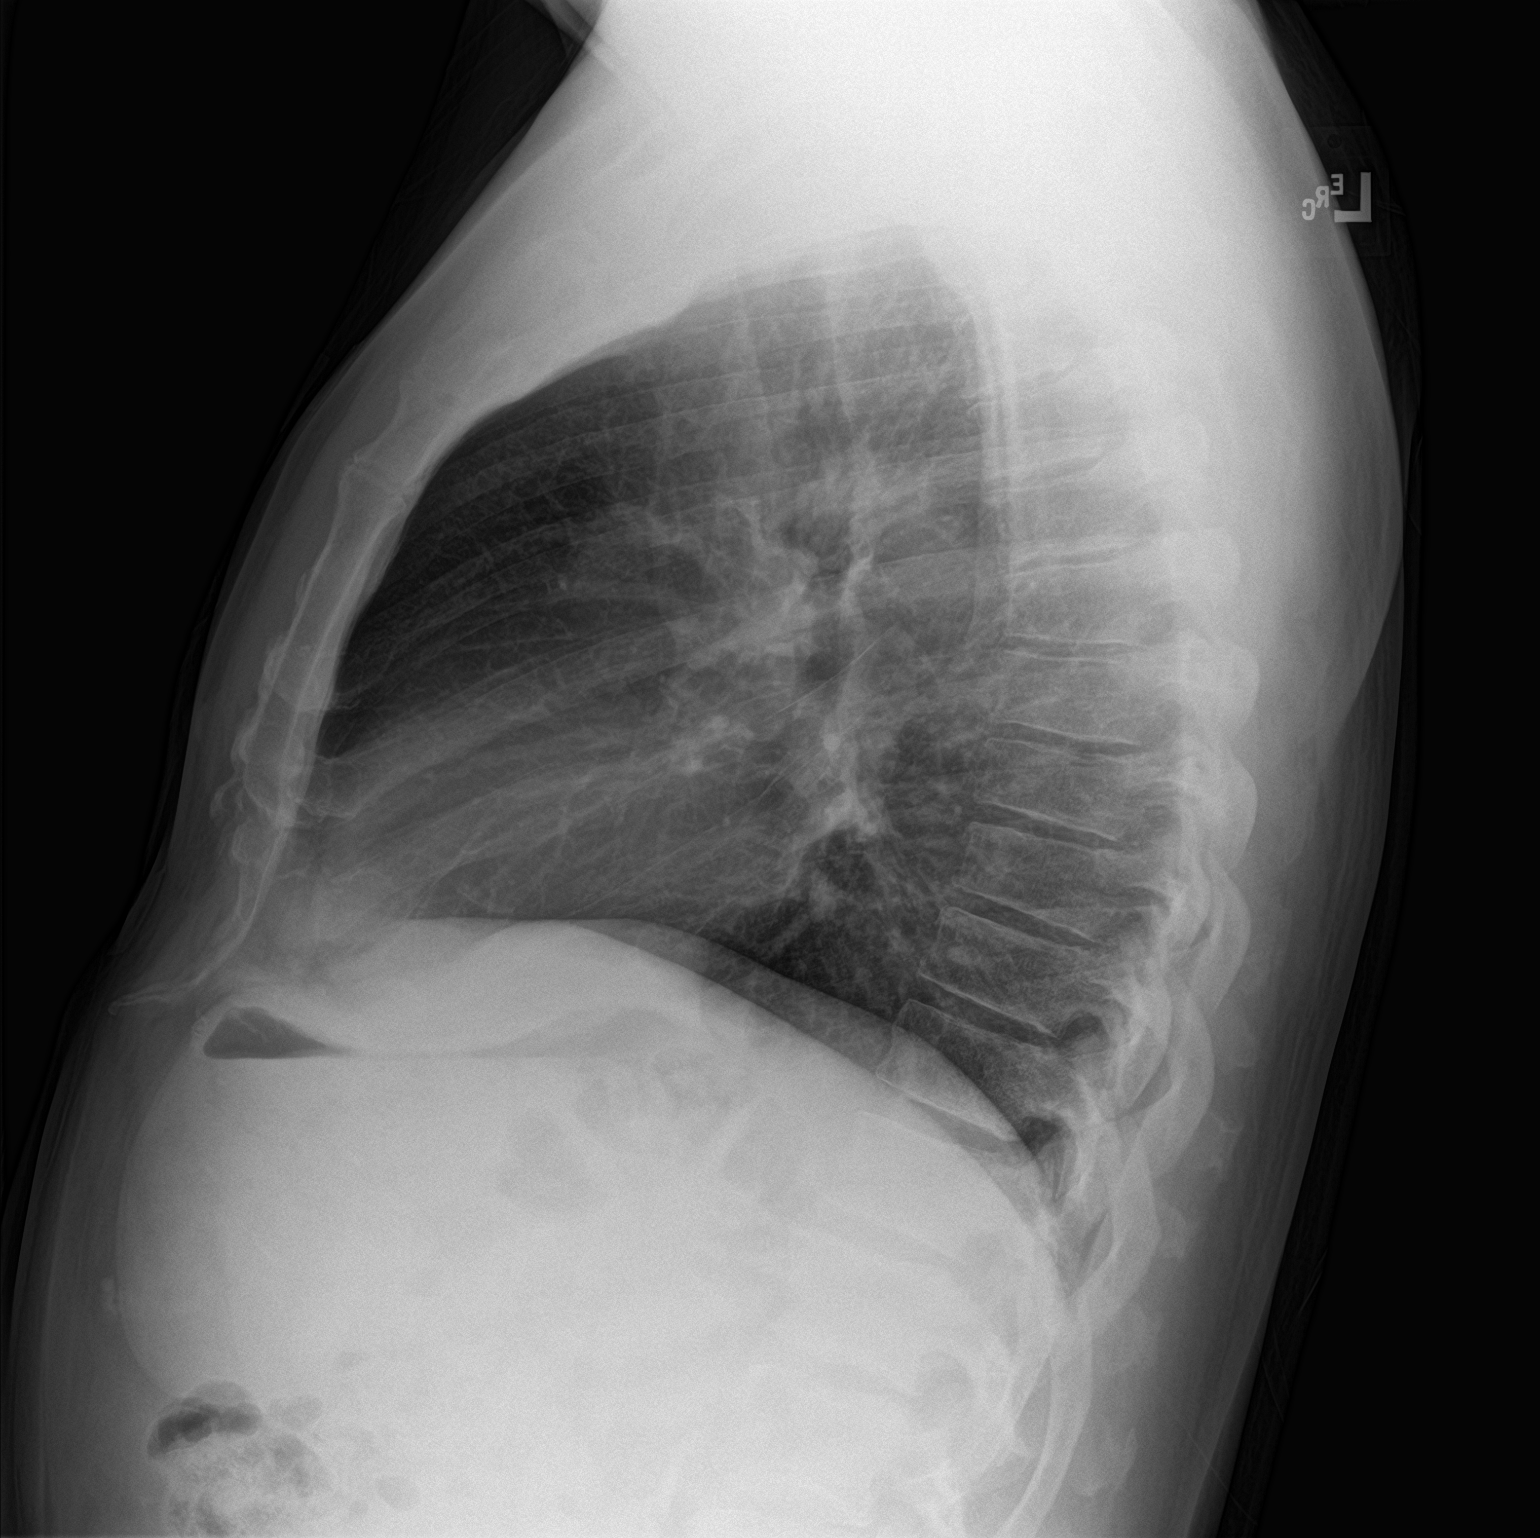

[2 of 2 positions shown; findings below may reference images not displayed]

FINDINGS: There is no evidence of acute infiltrate, pleural effusion or
pneumothorax. The heart size and mediastinal contours are within
normal limits. The visualized skeletal structures are unremarkable.
IMPRESSION: No active cardiopulmonary disease.

## 2019-10-21 NOTE — ED Triage Notes (Signed)
Patient reports having chest pain off/on for weeks.  Today reports feeling dizzy and sweaty.

## 2019-10-22 ENCOUNTER — Emergency Department
Admission: EM | Admit: 2019-10-22 | Discharge: 2019-10-23 | Disposition: A | Discharge status: Home / Self Care | Payer: Self-pay | Attending: Emergency Medicine | Admitting: Emergency Medicine

## 2019-10-22 NOTE — ED Notes (Signed)
Called pt, not in lobby.

## 2019-10-22 NOTE — ED Notes (Signed)
Called pt, unable to locate in lobby

## 2019-10-23 ENCOUNTER — Telehealth: Payer: Self-pay | Admitting: Emergency Medicine

## 2019-10-23 NOTE — Telephone Encounter (Signed)
Called patient due to lwot to inquire about condition and follow up plans. Left message.   

## 2020-05-22 ENCOUNTER — Telehealth: Payer: Self-pay | Admitting: Oncology

## 2020-05-22 ENCOUNTER — Encounter: Payer: Self-pay | Admitting: Oncology

## 2020-05-22 ENCOUNTER — Inpatient Hospital Stay: Payer: Self-pay | Attending: Oncology | Admitting: Oncology

## 2020-05-22 ENCOUNTER — Inpatient Hospital Stay: Payer: Self-pay

## 2020-05-22 ENCOUNTER — Encounter (INDEPENDENT_AMBULATORY_CARE_PROVIDER_SITE_OTHER): Payer: Self-pay

## 2020-05-22 VITALS — BP 117/75 | HR 63 | Temp 96.6°F | Wt 195.9 lb

## 2020-05-22 DIAGNOSIS — Z832 Family history of diseases of the blood and blood-forming organs and certain disorders involving the immune mechanism: Secondary | ICD-10-CM | POA: Insufficient documentation

## 2020-05-22 DIAGNOSIS — R161 Splenomegaly, not elsewhere classified: Secondary | ICD-10-CM | POA: Insufficient documentation

## 2020-05-22 DIAGNOSIS — R0602 Shortness of breath: Secondary | ICD-10-CM | POA: Insufficient documentation

## 2020-05-22 DIAGNOSIS — R9389 Abnormal findings on diagnostic imaging of other specified body structures: Secondary | ICD-10-CM

## 2020-05-22 DIAGNOSIS — I10 Essential (primary) hypertension: Secondary | ICD-10-CM | POA: Insufficient documentation

## 2020-05-22 DIAGNOSIS — Z809 Family history of malignant neoplasm, unspecified: Secondary | ICD-10-CM | POA: Insufficient documentation

## 2020-05-22 DIAGNOSIS — Z8269 Family history of other diseases of the musculoskeletal system and connective tissue: Secondary | ICD-10-CM | POA: Insufficient documentation

## 2020-05-22 DIAGNOSIS — Z79899 Other long term (current) drug therapy: Secondary | ICD-10-CM | POA: Insufficient documentation

## 2020-05-22 DIAGNOSIS — R590 Localized enlarged lymph nodes: Secondary | ICD-10-CM | POA: Insufficient documentation

## 2020-05-22 DIAGNOSIS — F17210 Nicotine dependence, cigarettes, uncomplicated: Secondary | ICD-10-CM | POA: Insufficient documentation

## 2020-05-22 DIAGNOSIS — R591 Generalized enlarged lymph nodes: Secondary | ICD-10-CM

## 2020-05-22 DIAGNOSIS — R7402 Elevation of levels of lactic acid dehydrogenase (LDH): Secondary | ICD-10-CM | POA: Insufficient documentation

## 2020-05-22 DIAGNOSIS — Z8249 Family history of ischemic heart disease and other diseases of the circulatory system: Secondary | ICD-10-CM | POA: Insufficient documentation

## 2020-05-22 DIAGNOSIS — G5623 Lesion of ulnar nerve, bilateral upper limbs: Secondary | ICD-10-CM | POA: Insufficient documentation

## 2020-05-22 LAB — LACTATE DEHYDROGENASE: LDH: 1207 U/L — ABNORMAL HIGH (ref 98–192)

## 2020-05-22 LAB — CBC WITH DIFFERENTIAL/PLATELET
Abs Immature Granulocytes: 0 10*3/uL (ref 0.00–0.07)
Basophils Absolute: 0 10*3/uL (ref 0.0–0.1)
Basophils Relative: 1 %
Eosinophils Absolute: 0.1 10*3/uL (ref 0.0–0.5)
Eosinophils Relative: 1 %
HCT: 41.2 % (ref 39.0–52.0)
Hemoglobin: 14.1 g/dL (ref 13.0–17.0)
Immature Granulocytes: 0 %
Lymphocytes Relative: 52 %
Lymphs Abs: 1.8 10*3/uL (ref 0.7–4.0)
MCH: 29.7 pg (ref 26.0–34.0)
MCHC: 34.2 g/dL (ref 30.0–36.0)
MCV: 86.9 fL (ref 80.0–100.0)
Monocytes Absolute: 0.4 10*3/uL (ref 0.1–1.0)
Monocytes Relative: 11 %
Neutro Abs: 1.2 10*3/uL — ABNORMAL LOW (ref 1.7–7.7)
Neutrophils Relative %: 35 %
Platelets: 213 10*3/uL (ref 150–400)
RBC: 4.74 MIL/uL (ref 4.22–5.81)
RDW: 13.4 % (ref 11.5–15.5)
WBC: 3.5 10*3/uL — ABNORMAL LOW (ref 4.0–10.5)
nRBC: 0 % (ref 0.0–0.2)

## 2020-05-22 LAB — COMPREHENSIVE METABOLIC PANEL
ALT: 29 U/L (ref 0–44)
AST: 44 U/L — ABNORMAL HIGH (ref 15–41)
Albumin: 4.5 g/dL (ref 3.5–5.0)
Alkaline Phosphatase: 97 U/L (ref 38–126)
Anion gap: 9 (ref 5–15)
BUN: 13 mg/dL (ref 6–20)
CO2: 25 mmol/L (ref 22–32)
Calcium: 10.1 mg/dL (ref 8.9–10.3)
Chloride: 104 mmol/L (ref 98–111)
Creatinine, Ser: 0.94 mg/dL (ref 0.61–1.24)
GFR, Estimated: >60 mL/min (ref >60)
Glucose, Bld: 86 mg/dL (ref 70–99)
Potassium: 4.2 mmol/L (ref 3.5–5.1)
Sodium: 138 mmol/L (ref 135–145)
Total Bilirubin: 0.8 mg/dL (ref 0.3–1.2)
Total Protein: 7.6 g/dL (ref 6.5–8.1)

## 2020-05-22 LAB — HIV ANTIBODY (ROUTINE TESTING W REFLEX)
HIV Screen 4th Generation wRfx: NONREACTIVE
HIV Screen 4th Generation wRfx: NONREACTIVE

## 2020-05-22 LAB — URIC ACID: Uric Acid, Serum: 7.6 mg/dL (ref 3.7–8.6)

## 2020-05-22 LAB — HEPATITIS B SURFACE ANTIGEN: Hepatitis B Surface Ag: NONREACTIVE

## 2020-05-22 NOTE — Telephone Encounter (Signed)
Left VM with patient notifying him of PET scan r/s for Monday 3/14 at [facility].

## 2020-05-22 NOTE — Progress Notes (Signed)
Hematology/Oncology Consult note [facility] [phone] [phone]  Patient Care Team: Patient, No Pcp Per as PCP - General (General Practice)   Name of the patient: Christopher Mills  [id number]  11-Sep-1960    Reason for referral-mediastinal adenopathy   Referring physician-Dr. Rosario Jacks  Date of visit: 05/22/20   History of presenting illness- Patient is a 60 year old male with a past medical history significant for hypertension.  He is experiencing symptoms of exertional shortness of breath for which she underwent a cardiology work-up including a stress test.  He was also prescribed PPIs for possible reflux symptoms.  He then underwent a chest x-ray on 05/15/2020 which showed right paratracheal mass and a CT was recommended.  CT chest done at [facility] on 05/19/2020 showed a right paratracheal mass measuring 6.4 x 4.7 cm.  Multiple enlarged mediastinal lymph nodes including a subcarinal lymph node measuring 3.3 cm.  Right hilar lymph node measuring 2.3 cm.  Multiple hypoattenuating lesions within the spleen with the largest measuring 4 x 4 centimeter.  Lymphadenopathy in the upper abdomen including perisplenic lymph node measuring 1.8 cm and para-aortic lymph node measuring 1.4 cm.  Patient referred for further evaluation.  He lives with his 74 year old daughter.  He remains independent of his ADLs and IADLs.  Reports that his appetite is good and he denies any unintentional weight loss or drenching night sweats.  He does report occasional exertional shortness of breath.  ECOG PS- 0  Pain scale- 0   Review of systems- Review of Systems  Constitutional: Positive for malaise/fatigue. Negative for chills, fever and weight loss.  HENT: Negative for congestion, ear discharge and nosebleeds.   Eyes: Negative for blurred vision.  Respiratory: Positive for shortness of breath. Negative for cough, hemoptysis, sputum production and wheezing.   Cardiovascular:  Negative for chest pain, palpitations, orthopnea and claudication.  Gastrointestinal: Negative for abdominal pain, blood in stool, constipation, diarrhea, heartburn, melena, nausea and vomiting.  Genitourinary: Negative for dysuria, flank pain, frequency, hematuria and urgency.  Musculoskeletal: Negative for back pain, joint pain and myalgias.  Skin: Negative for rash.  Neurological: Negative for dizziness, tingling, focal weakness, seizures, weakness and headaches.  Endo/Heme/Allergies: Does not bruise/bleed easily.  Psychiatric/Behavioral: Negative for depression and suicidal ideas. The patient does not have insomnia.     No Known Allergies  There are no problems to display for this patient.    Past Medical History:  Diagnosis Date  . GERD (gastroesophageal reflux disease)   . Hypertension      Past Surgical History:  Procedure Laterality Date  . HERNIA REPAIR     at age 71    Social History   Socioeconomic History  . Marital status: Single    Spouse name: Not on file  . Number of children: Not on file  . Years of education: Not on file  . Highest education level: Not on file  Occupational History  . Not on file  Tobacco Use  . Smoking status: Current Some Day Smoker  . Smokeless tobacco: Never Used  Vaping Use  . Vaping Use: Never used  Substance and Sexual Activity  . Alcohol use: Yes  . Drug use: Never  . Sexual activity: Not Currently  Other Topics Concern  . Not on file  Social History Narrative  . Not on file   Social Determinants of Health   Financial Resource Strain: Not on file  Food Insecurity: Not on file  Transportation Needs: Not on file  Physical Activity:  Not on file  Stress: Not on file  Social Connections: Not on file  Intimate Partner Violence: Not on file     Family History  Problem Relation Age of Onset  . Anemia Mother   . Hypertension Mother   . Goiter Mother   . Cancer Father   . Cancer Sister   . Multiple sclerosis  Sister      Current Outpatient Medications:  .  atenolol (TENORMIN) 25 MG tablet, Take 25 mg by mouth daily., Disp: , Rfl:  .  lisinopril (ZESTRIL) 10 MG tablet, Take 10 mg by mouth daily., Disp: , Rfl:  .  omeprazole (PRILOSEC) 20 MG capsule, TAKE ONE CAPSULE BY MOUTH ONE TIME DAILY AS NEEDED (Patient not taking: Reported on 05/22/2020), Disp: , Rfl:    Physical exam:  Vitals:   05/22/20 1131  BP: 117/75  Pulse: 63  Temp: (!) 96.6 F (35.9 C)  TempSrc: Tympanic  SpO2: 100%  Weight: 195 lb 14.4 oz (88.9 kg)   Physical Exam Constitutional:      General: He is not in acute distress. Cardiovascular:     Rate and Rhythm: Normal rate and regular rhythm.     Heart sounds: Normal heart sounds.  Pulmonary:     Effort: Pulmonary effort is normal.     Breath sounds: Normal breath sounds.  Abdominal:     General: Bowel sounds are normal.     Palpations: Abdomen is soft.     Comments: Splenic tip is palpable.  Lymphadenopathy:     Comments: Palpable right cervical adenopathy.  No palpable bilateral axillary adenopathy.  Palpable right inguinal adenopathy.    Skin:    General: Skin is warm and dry.  Neurological:     Mental Status: He is alert and oriented to person, place, and time.        CMP Latest Ref Rng & Units 05/22/2020  Glucose 70 - 99 mg/dL 86  BUN 6 - 20 mg/dL 13  Creatinine 0.61 - 1.24 mg/dL 0.94  Sodium 135 - 145 mmol/L 138  Potassium 3.5 - 5.1 mmol/L 4.2  Chloride 98 - 111 mmol/L 104  CO2 22 - 32 mmol/L 25  Calcium 8.9 - 10.3 mg/dL 10.1  Total Protein 6.5 - 8.1 g/dL 7.6  Total Bilirubin 0.3 - 1.2 mg/dL 0.8  Alkaline Phos 38 - 126 U/L 97  AST 15 - 41 U/L 44(H)  ALT 0 - 44 U/L 29   CBC Latest Ref Rng & Units 05/22/2020  WBC 4.0 - 10.5 K/uL 3.5(L)  Hemoglobin 13.0 - 17.0 g/dL 14.1  Hematocrit 39.0 - 52.0 % 41.2  Platelets 150 - 400 K/uL 213     Assessment and plan- Patient is a 60 y.o. male referred for bulky mediastinal and upper abdominal adenopathy as  well as hypoattenuating lesions in the spleen concerning for lymphoma  We will obtain images of CT chest done at [facility] for review.  Based on CT chest report patient has bulky mediastinal as well as upper abdominal adenopathy and hypoattenuating lesions in the spleen which are all concerning for lymphoma.  Patient not noted to have any primary lung mass which would suggest primary lung cancer.  At this time I will obtain a CBC with differential, CMP, LDH, uric acid, HIV hepatitis B and hepatitis C testing  We will proceed with a PET CT scan ASAP especially given that his LDH came back elevated at 1200.  Based on his PET CT scan findings we will decide which  would be the best site to biopsy.  I will tentatively see the patient back in 2 weeks time after PET CT scan results and biopsy results are back   Thank you for this kind referral and the opportunity to participate in the care of this patient   Visit Diagnosis 1. Abnormal CT scan   2. Lymphadenopathy   3. Lesions of both ulnar nerves   4. Splenic mass     Dr. Randa Evens, MD, MPH [facility] [phone] 05/22/2020  2:46 PM

## 2020-05-23 LAB — HEPATITIS B SURFACE ANTIBODY, QUANTITATIVE: Hepatitis B-Post: 54 m[IU]/mL (ref >9.9)

## 2020-05-26 ENCOUNTER — Other Ambulatory Visit: Payer: Self-pay

## 2020-05-26 ENCOUNTER — Encounter: Payer: Self-pay | Admitting: Surgery

## 2020-05-26 ENCOUNTER — Ambulatory Visit (INDEPENDENT_AMBULATORY_CARE_PROVIDER_SITE_OTHER): Payer: Self-pay | Admitting: Surgery

## 2020-05-26 ENCOUNTER — Ambulatory Visit (HOSPITAL_COMMUNITY)
Admission: RE | Admit: 2020-05-26 | Discharge: 2020-05-26 | Disposition: A | Discharge status: Home / Self Care | Payer: Self-pay | Source: Ambulatory Visit | Attending: Oncology | Admitting: Oncology

## 2020-05-26 VITALS — BP 112/74 | HR 75 | Temp 98.3°F | Ht 73.0 in | Wt 193.0 lb

## 2020-05-26 DIAGNOSIS — R9389 Abnormal findings on diagnostic imaging of other specified body structures: Secondary | ICD-10-CM | POA: Insufficient documentation

## 2020-05-26 DIAGNOSIS — R161 Splenomegaly, not elsewhere classified: Secondary | ICD-10-CM | POA: Insufficient documentation

## 2020-05-26 DIAGNOSIS — R591 Generalized enlarged lymph nodes: Secondary | ICD-10-CM

## 2020-05-26 LAB — GLUCOSE, CAPILLARY: Glucose-Capillary: 104 mg/dL — ABNORMAL HIGH (ref 70–99)

## 2020-05-26 IMAGING — PT NM PET TUM IMG INITIAL (PI) SKULL BASE T - THIGH
1 of 8 series · 1 of 25 positions shown · non-contrast
Comparison: None.

CLINICAL DATA: Initial treatment strategy for adenopathy and
splenic mass.

EXAM:
NUCLEAR MEDICINE PET SKULL BASE TO THIGH
TECHNIQUE: 9.7 mCi F-18 FDG was injected intravenously. Full-ring PET imaging
was performed from the skull base to thigh after the radiotracer. CT
data was obtained and used for attenuation correction and anatomic
localization.
Fasting blood glucose: 104 mg/dl

[Series 4: ct sk_thigh 5.0 bf37 · axial · 5.0mm · 0.98mm/px · 1 of 238 slices shown]
[im 238/238  brain]
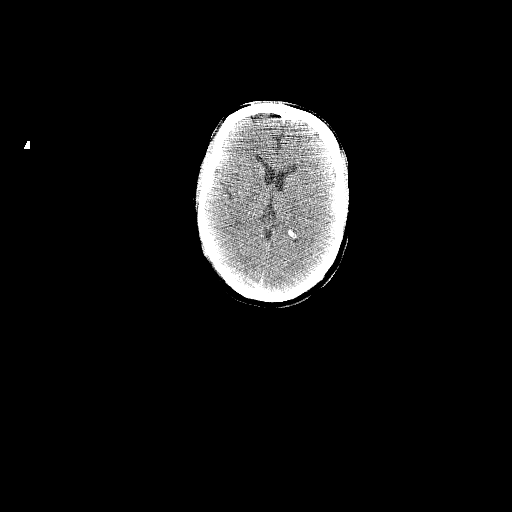

[1 of 25 positions shown; findings below may reference images not displayed]

FINDINGS: Mediastinal blood pool activity: SUV max

Liver activity: SUV max NA

NECK: Bilateral FDG avid cervical lymph nodes are identified,
including:

-Index right level [DATE] node measures 1.7 cm and has an SUV max of
21.62, image 44/4.

-Left level [DATE] node measures 1.1 cm within SUV max of 4.7, image
43/4.

Incidental CT findings: none

CHEST: FDG avid supraclavicular, mediastinal, and right hilar lymph
nodes are identified including:

-right supraclavicular lymph node measuring 2.9 cm with SUV max of
22, image 47/4.

-conglomerate nodal mass within the superior mediastinum measures
4.4 x 5.7 cm and has an SUV max of 21.58, image 66/4.

Right lower paratracheal lymph node measures 2.9 cm and has an SUV
max of 20.12, image 72/4.

-subcarinal lymph node measures 3 cm and has an SUV max of 23.47,
image 80/4.

-right hilar lymph node measures 1.1 cm and has an SUV max of 12.83,
image 79/4.

No pleural effusion. No airspace consolidation, atelectasis, or
pneumothorax.

Incidental CT findings: Mild aortic atherosclerosis.

ABDOMEN/PELVIS: No abnormal FDG uptake within the liver, pancreas or
adrenal glands.

Normal size spleen contains multiple FDG avid low-attenuation
lesions. The largest measures 5.9 cm with SUV max of 17.4, image
112/4.

FDG avid abdominopelvic lymph nodes are identified, including:

-gastrohepatic ligament node measuring 1.4 cm with SUV max of 19.37,
image 112/4.

-Left periaortic node measures 1.3 cm within SUV max of 24.39, image
146/4.

-right pelvic sidewall lymph node measures 3.5 cm with SUV max of
24.88, image 175/4.

-Left inguinal lymph node measures 1.3 cm within SUV max of 14.03,
image 200/4.

Incidental CT findings: No ascites. Aortic atherosclerosis without
aneurysm.

SKELETON: FDG avid intramuscular lesion within the right biceps
femoris muscle measures 3.1 cm and has an SUV max of 28.6, image
214/4. No FDG avid osseous lesions.

Incidental CT findings: none
IMPRESSION: 1. Examination is positive for extensive FDG avid adenopathy within
the neck, chest, abdomen, and pelvis. Additionally, there are
multiple FDG avid splenic lesions. Imaging findings are consistent
with lymphoproliferative disorder. Correlation with tissue sampling
advised.
2. Solitary intramuscular FDG avid lesion within the right lower
extremity. Also concerning for lymphoma.
3.  Aortic Atherosclerosis ([T2]-[T2]).

## 2020-05-26 MED ORDER — FLUDEOXYGLUCOSE F - 18 (FDG) INJECTION
9.7000 | Freq: Once | INTRAVENOUS | Status: AC | PRN
  Administered 2020-05-26: 9.7 via INTRAVENOUS

## 2020-05-26 NOTE — Progress Notes (Signed)
Patient ID: Christopher Mills, male   DOB: December 01, 1960, 60 y.o.   MRN: [id number]  HPI Christopher Mills is a 60 y.o. male in consultation at the request of Dr. Janese Banks.  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  I have personally reviewed the PET CT scan showing evidence of supraclavicular adenopathy as well as right posterior neck adenopathy.  There is also evidence of adenopathy in the chest, abdomen and pelvis but the neck seems to be the most amenable place for excisional biopsy.  CBC and CMP is completely normal except some mild leukopenia.  He does report dyspnea on exertion. This prompted a CT scan of the chest showing evidence of mediastinal lymphadenopathy consistent with lymphoma. He reports having an open hernia surgery several years ago. He denies any fevers any chills no night sweats no B type symptoms.  No weight loss. He is able to perform more than 4 METS of activity without any  chest pain.  He works at [employer] and does most of the work  HPI  Past Medical History:  Diagnosis Date  . GERD (gastroesophageal reflux disease)   . Hypertension     Past Surgical History:  Procedure Laterality Date  . HERNIA REPAIR     at age 52    Family History  Problem Relation Age of Onset  . Anemia Mother   . Hypertension Mother   . Goiter Mother   . Cancer Father   . Cancer Sister   . Multiple sclerosis Sister     Social History Social History   Tobacco Use  . Smoking status: Current Some Day Smoker  . Smokeless tobacco: Never Used  Vaping Use  . Vaping Use: Never used  Substance Use Topics  . Alcohol use: Yes  . Drug use: Never    No Known Allergies  Current Outpatient Medications  Medication Sig Dispense Refill  . atenolol (TENORMIN) 25 MG tablet Take 25 mg by mouth daily.    Marland Kitchen lisinopril (ZESTRIL) 10 MG tablet Take 10 mg by mouth daily.    Marland Kitchen omeprazole (PRILOSEC) 20 MG capsule TAKE ONE CAPSULE BY MOUTH ONE TIME DAILY AS NEEDED (Patient not taking:  Reported on 05/22/2020)     No current facility-administered medications for this visit.     Review of Systems Full ROS  was asked and was negative except for the information on the HPI  Physical Exam There were no vitals taken for this visit. CONSTITUTIONAL: NAD EYES: Pupils are equal, round, and reactive to light, Sclera are non-icteric. EARS, NOSE, MOUTH AND THROAT: The oropharynx is clear. The oral mucosa is pink and moist. Hearing is intact to voice. LYMPH NODES:  Lymph nodes in the neck are enlarged on the right, evidence of palpable supraclavicular lymphadenopathy.  No evidence of axillary lymphadenopathy There Is also evidence of small palpable lymph nodes bilateral groins. RESPIRATORY:  Lungs are clear. There is normal respiratory effort, with equal breath sounds bilaterally, and without pathologic use of accessory muscles. CARDIOVASCULAR: Heart is regular without murmurs, gallops, or rubs. GI: The abdomen is  soft, nontender, and nondistended. There are no palpable masses. There is no hepatosplenomegaly. There are normal bowel sounds in all quadrants. GU: Rectal deferred.   MUSCULOSKELETAL: Normal muscle strength and tone. No cyanosis or edema.   SKIN: Turgor is good and there are no pathologic skin lesions or ulcers. NEUROLOGIC: Motor and sensation is grossly normal. Cranial nerves are grossly intact. PSYCH:  Oriented to  person, place and time. Affect is normal.  Data Reviewed  I have personally reviewed the patient's imaging, laboratory findings and medical records.    Assessment/Plan [name] is a 60 year old male diffuse adenopathy concerning for lymphoma. He is  In need for formal tissue diagnosis.  Based on physical exam and PET scan I do think he is amenable for excisional biopsy of right neck.  Discussed with patient in detail.  Risks, benefits and complications include but not limited to: bleeding, infection, insufficient sample.  Nondiagnostic biopsy.  Seroma, nerve  injuries and chronic pain.  He understands and wished to proceed.  He is also overwhelmed by the situation and wishes to have these postpone the procedure for 2 weeks or so.  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 Janese Banks a copy of this report was sent to the referring provider.   Caroleen Hamman, MD FACS General Surgeon 05/26/2020, 1:21 PM

## 2020-05-26 NOTE — Patient Instructions (Addendum)
Call us when you are ready to schedule your surgical biopsy. We can do the surgery as early as March 25th or March 31st.   Open Lymph Node Biopsy An open lymph node biopsy is a procedure to remove a lymph node so that it can be checked for disease. Lymph nodes are part of the body's disease-fighting system (immune system). The immune system protects the body from infections, germs, and diseases. An open lymph node biopsy may be done to:  Look for germs or cancer cells in your lymph node.  Find out why your lymph node is swollen.  Find out more about a condition you have. Lymph nodes are found in many locations in the body. Biopsies are often done on lymph nodes in the head, neck, armpit, or groin. Tell a health care provider about:  Any allergies you have.  All medicines you are taking, including vitamins, herbs, eye drops, creams, and over-the-counter medicines.  Any problems you or family members have had with anesthetic medicines.  Any blood disorders you have.  Any surgeries you have had.  Any medical conditions you have or have had.  Whether you are pregnant or may be pregnant. What are the risks? Generally, this is a safe procedure. However, problems may occur, including:  Infection.  Bleeding.  Allergic reactions to medicines.  Damage to surrounding structures or organs, such as a nerve.  Scarring. What happens before the procedure? Medicines Ask your health care provider about:  Changing or stopping your regular medicines. This is especially important if you are taking diabetes medicines or blood thinners.  Taking medicines such as aspirin and ibuprofen. These medicines can thin your blood. Do not take these medicines before the procedure unless your health care provider tells you to take them.  Taking over-the-counter medicines, vitamins, herbs, and supplements. Surgery safety Ask your health care provider:  How your surgery site will be marked.  What  steps will be taken to help prevent infection. These steps may include: ? Removing hair at the surgery site. ? Washing skin with a germ-killing soap. ? Receiving antibiotic medicine. General instructions  Follow instructions from your health care provider about eating or drinking restrictions.  You may have an exam or testing.  You may have a blood or urine sample taken.  If you will be going home right after the procedure, plan to have a responsible adult care for you for the time you are told. This is important. What happens during the procedure?  An IV will be inserted into one of your veins.  You will be given one or more of the following: ? A medicine to help you relax (sedative). ? A medicine to numb the area (local anesthetic).  An incision will be made in the area where your lymph node is located.  Your lymph node will be removed.  Your incision will be closed with stitches (sutures).  An antibiotic ointment may be applied to your incision.  A bandage (dressing) will be placed over your incision. The procedure may vary among health care providers and hospitals.   What happens after the procedure?  Your blood pressure, heart rate, breathing rate, and blood oxygen level will be monitored until you leave the hospital or clinic.  Do not drive for 24 hours if you were given a sedative during your procedure.  It is up to you to get the results of your procedure. Ask your health care provider, or the department that is doing the procedure, when your  results will be ready. Summary  An open lymph node biopsy is a procedure to remove a lymph node so that it can be checked for disease.  Generally, this is a safe procedure. However, problems may occur, including bleeding, infection, allergic reaction to medicines, and damage to other structures or organs.  During the procedure, an incision will be made in the area of the lymph node, the lymph node will be removed, and the  incision will be closed with sutures.  You will be monitored after the procedure. Do not drive for 24 hours if you were given a sedative during your procedure. This information is not intended to replace advice given to you by your health care provider. Make sure you discuss any questions you have with your health care provider. Document Revised: 12/13/2019 Document Reviewed: 12/13/2019 Elsevier Patient Education  [employer].

## 2020-05-26 NOTE — H&P (View-Only) (Signed)
Patient ID: Christopher Mills, male   DOB: 10-26-1960, 60 y.o.   MRN: [id number]  HPI Christopher Mills is a 60 y.o. male in consultation at the request of Dr. Janese Mills.  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  I have personally reviewed the PET CT scan showing evidence of supraclavicular adenopathy as well as right posterior neck adenopathy.  There is also evidence of adenopathy in the chest, abdomen and pelvis but the neck seems to be the most amenable place for excisional biopsy.  CBC and CMP is completely normal except some mild leukopenia.  He does report dyspnea on exertion. This prompted a CT scan of the chest showing evidence of mediastinal lymphadenopathy consistent with lymphoma. He reports having an open hernia surgery several years ago. He denies any fevers any chills no night sweats no B type symptoms.  No weight loss. He is able to perform more than 4 METS of activity without any  chest pain.  He works at [employer] and does most of the work  HPI  Past Medical History:  Diagnosis Date  . GERD (gastroesophageal reflux disease)   . Hypertension     Past Surgical History:  Procedure Laterality Date  . HERNIA REPAIR     at age 72    Family History  Problem Relation Age of Onset  . Anemia Mother   . Hypertension Mother   . Goiter Mother   . Cancer Father   . Cancer Sister   . Multiple sclerosis Sister     Social History Social History   Tobacco Use  . Smoking status: Current Some Day Smoker  . Smokeless tobacco: Never Used  Vaping Use  . Vaping Use: Never used  Substance Use Topics  . Alcohol use: Yes  . Drug use: Never    No Known Allergies  Current Outpatient Medications  Medication Sig Dispense Refill  . atenolol (TENORMIN) 25 MG tablet Take 25 mg by mouth daily.    Marland Kitchen lisinopril (ZESTRIL) 10 MG tablet Take 10 mg by mouth daily.    Marland Kitchen omeprazole (PRILOSEC) 20 MG capsule TAKE ONE CAPSULE BY MOUTH ONE TIME DAILY AS NEEDED (Patient not taking:  Reported on 05/22/2020)     No current facility-administered medications for this visit.     Review of Systems Full ROS  was asked and was negative except for the information on the HPI  Physical Exam There were no vitals taken for this visit. CONSTITUTIONAL: NAD EYES: Pupils are equal, round, and reactive to light, Sclera are non-icteric. EARS, NOSE, MOUTH AND THROAT: The oropharynx is clear. The oral mucosa is pink and moist. Hearing is intact to voice. LYMPH NODES:  Lymph nodes in the neck are enlarged on the right, evidence of palpable supraclavicular lymphadenopathy.  No evidence of axillary lymphadenopathy There Is also evidence of small palpable lymph nodes bilateral groins. RESPIRATORY:  Lungs are clear. There is normal respiratory effort, with equal breath sounds bilaterally, and without pathologic use of accessory muscles. CARDIOVASCULAR: Heart is regular without murmurs, gallops, or rubs. GI: The abdomen is  soft, nontender, and nondistended. There are no palpable masses. There is no hepatosplenomegaly. There are normal bowel sounds in all quadrants. GU: Rectal deferred.   MUSCULOSKELETAL: Normal muscle strength and tone. No cyanosis or edema.   SKIN: Turgor is good and there are no pathologic skin lesions or ulcers. NEUROLOGIC: Motor and sensation is grossly normal. Cranial nerves are grossly intact. PSYCH:  Oriented to  person, place and time. Affect is normal.  Data Reviewed  I have personally reviewed the patient's imaging, laboratory findings and medical records.    Assessment/Plan Christopher Mills is a 60 year old male diffuse adenopathy concerning for lymphoma. He is  In need for formal tissue diagnosis.  Based on physical exam and PET scan I do think he is amenable for excisional biopsy of right neck.  Discussed with patient in detail.  Risks, benefits and complications include but not limited to: bleeding, infection, insufficient sample.  Nondiagnostic biopsy.  Seroma, nerve  injuries and chronic pain.  He understands and wished to proceed.  He is also overwhelmed by the situation and wishes to have these postpone the procedure for 2 weeks or so.  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 Janese Mills a copy of this report was sent to the referring provider.   Caroleen Hamman, MD FACS General Surgeon 05/26/2020, 1:21 PM

## 2020-05-27 ENCOUNTER — Telehealth: Payer: Self-pay | Admitting: Surgery

## 2020-05-27 NOTE — Telephone Encounter (Signed)
Patient has been advised of Pre-Admission date/time, COVID Testing date and Surgery date.  Surgery Date: 06/12/20 Preadmission Testing Date: 06/03/20 (phone 8a-1p) Covid Testing Date: 06/10/20 in person @ 9:25 am - patient advised to go to the [facility] ([place])   Patient has been made aware to call [phone], between 1-3:00pm the day before surgery, to find out what time to arrive for surgery.

## 2020-06-02 ENCOUNTER — Encounter (HOSPITAL_COMMUNITY): Payer: Self-pay

## 2020-06-03 ENCOUNTER — Other Ambulatory Visit: Payer: Self-pay

## 2020-06-03 ENCOUNTER — Encounter
Admission: RE | Admit: 2020-06-03 | Discharge: 2020-06-03 | Disposition: A | Discharge status: Home / Self Care | Payer: Self-pay | Source: Ambulatory Visit | Attending: Surgery | Admitting: Surgery

## 2020-06-03 NOTE — Patient Instructions (Signed)
Your procedure is scheduled on: 06/12/20 Report to [facility]. To find out your arrival time please call [phone] between 1PM - 3PM on 06/11/20.  Remember: Instructions that are not followed completely may result in serious medical risk, up to and including death, or upon the discretion of your surgeon and anesthesiologist your surgery may need to be rescheduled.     _X__ 1. Do not eat food after midnight the night before your procedure.                 No gum chewing or hard candies. You may drink clear liquids up to 2 hours                 before you are scheduled to arrive for your surgery- DO not drink clear                 liquids within 2 hours of the start of your surgery.                 Clear Liquids include:  water, apple juice without pulp, clear carbohydrate                 drink such as Clearfast or Gatorade, Black Coffee or Tea (Do not add                 anything to coffee or tea). Diabetics water only  __X__2.  On the morning of surgery brush your teeth with toothpaste and water, you                 may rinse your mouth with mouthwash if you wish.  Do not swallow any              toothpaste of mouthwash.     _X__ 3.  No Alcohol for 24 hours before or after surgery.   _X__ 4.  Do Not Smoke or use e-cigarettes For 24 Hours Prior to Your Surgery.                 Do not use any chewable tobacco products for at least 6 hours prior to                 surgery.  ____  5.  Bring all medications with you on the day of surgery if instructed.   __X__  6.  Notify your doctor if there is any change in your medical condition      (cold, fever, infections).     Do not wear jewelry, make-up, hairpins, clips or nail polish. Do not wear lotions, powders, or perfumes.  Do not shave 48 hours prior to surgery. Men may shave face and neck. Do not bring valuables to the hospital.    [facility] is not responsible for any belongings or  valuables.  Contacts, dentures/partials or body piercings may not be worn into surgery. Bring a case for your contacts, glasses or hearing aids, a denture cup will be supplied. Leave your suitcase in the car. After surgery it may be brought to your room. For patients admitted to the hospital, discharge time is determined by your treatment team.   Patients discharged the day of surgery will not be allowed to drive home.   Please read over the following fact sheets that you were given:   MRSA Information  __X__ Take these medicines the morning of surgery with A SIP OF WATER:  1. atenolol (TENORMIN) 25 MG tablet  2.   3.   4.  5.  6.  ____ Fleet Enema (as directed)   __X__ Use CHG Soap/SAGE wipes as directed  ____ Use inhalers on the day of surgery  ____ Stop metformin/Janumet/Farxiga 2 days prior to surgery    ____ Take 1/2 of usual insulin dose the night before surgery. No insulin the morning          of surgery.   ____ Stop Blood Thinners Coumadin/Plavix/Xarelto/Pleta/Pradaxa/Eliquis/Effient/Aspirin  on   Or contact your Surgeon, Cardiologist or Medical Doctor regarding  ability to stop your blood thinners  __X__ Stop Anti-inflammatories 7 days before surgery such as Advil, Ibuprofen, Motrin,  BC or Goodies Powder, Naprosyn, Naproxen, Aleve, Aspirin    __X__ Stop all herbal supplements, fish oil or vitamin E until after surgery.    ____ Bring C-Pap to the hospital.

## 2020-06-10 ENCOUNTER — Other Ambulatory Visit
Admission: RE | Admit: 2020-06-10 | Discharge: 2020-06-10 | Disposition: A | Discharge status: Home / Self Care | Payer: Self-pay | Source: Ambulatory Visit | Attending: Surgery | Admitting: Surgery

## 2020-06-10 DIAGNOSIS — Z01812 Encounter for preprocedural laboratory examination: Secondary | ICD-10-CM | POA: Insufficient documentation

## 2020-06-10 DIAGNOSIS — Z20822 Contact with and (suspected) exposure to covid-19: Secondary | ICD-10-CM | POA: Insufficient documentation

## 2020-06-10 LAB — SARS CORONAVIRUS 2 (TAT 6-24 HRS): SARS Coronavirus 2: NEGATIVE

## 2020-06-11 ENCOUNTER — Telehealth: Payer: Self-pay | Admitting: *Deleted

## 2020-06-11 MED ORDER — GABAPENTIN 300 MG PO CAPS
300.0000 mg | ORAL_CAPSULE | ORAL | Status: AC
  Administered 2020-06-12: 300 mg via ORAL

## 2020-06-11 MED ORDER — CHLORHEXIDINE GLUCONATE 0.12 % MT SOLN
15.0000 mL | Freq: Once | OROMUCOSAL | Status: AC
  Administered 2020-06-12: 15 mL via OROMUCOSAL

## 2020-06-11 MED ORDER — CEFAZOLIN SODIUM-DEXTROSE 2-4 GM/100ML-% IV SOLN
2.0000 g | INTRAVENOUS | Status: AC
  Administered 2020-06-12: 2 g via INTRAVENOUS

## 2020-06-11 MED ORDER — FAMOTIDINE 20 MG PO TABS
20.0000 mg | ORAL_TABLET | Freq: Once | ORAL | Status: AC
  Administered 2020-06-12: 20 mg via ORAL

## 2020-06-11 MED ORDER — LACTATED RINGERS IV SOLN: INTRAVENOUS | Status: DC

## 2020-06-11 MED ORDER — ORAL CARE MOUTH RINSE: 15.0000 mL | Freq: Once | OROMUCOSAL | Status: AC

## 2020-06-11 MED ORDER — CHLORHEXIDINE GLUCONATE CLOTH 2 % EX PADS: 6.0000 | MEDICATED_PAD | Freq: Once | CUTANEOUS | Status: DC

## 2020-06-11 MED ORDER — CELECOXIB 200 MG PO CAPS
200.0000 mg | ORAL_CAPSULE | ORAL | Status: AC
  Administered 2020-06-12: 200 mg via ORAL

## 2020-06-11 MED ORDER — ACETAMINOPHEN 500 MG PO TABS
1000.0000 mg | ORAL_TABLET | ORAL | Status: AC
  Administered 2020-06-12: 1000 mg via ORAL

## 2020-06-11 NOTE — Telephone Encounter (Signed)
Called to check on pt to see if he is ready for the bx tom. He says yes. 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 He says and good as it can get. He wanted to know what will happen after hhe gets bx. Tom. I told him we will be looking for the biopsy results and when we get results we will call pt and set up appt. To see what we need to do to help him. He is appreciable to have the call and will wait to hear from [place].

## 2020-06-12 ENCOUNTER — Ambulatory Visit: Payer: Self-pay | Admitting: Registered Nurse

## 2020-06-12 ENCOUNTER — Encounter
Admission: RE | Disposition: A | Discharge status: Home / Self Care | Payer: Self-pay | Source: Home / Self Care | Attending: Surgery

## 2020-06-12 ENCOUNTER — Other Ambulatory Visit: Payer: Self-pay

## 2020-06-12 ENCOUNTER — Encounter: Payer: Self-pay | Admitting: Surgery

## 2020-06-12 ENCOUNTER — Ambulatory Visit
Admission: RE | Admit: 2020-06-12 | Discharge: 2020-06-12 | Disposition: A | Discharge status: Home / Self Care | Payer: Self-pay | Attending: Surgery | Admitting: Surgery

## 2020-06-12 DIAGNOSIS — R591 Generalized enlarged lymph nodes: Secondary | ICD-10-CM

## 2020-06-12 DIAGNOSIS — C8301 Small cell B-cell lymphoma, lymph nodes of head, face, and neck: Secondary | ICD-10-CM

## 2020-06-12 DIAGNOSIS — Z79899 Other long term (current) drug therapy: Secondary | ICD-10-CM | POA: Insufficient documentation

## 2020-06-12 DIAGNOSIS — F17200 Nicotine dependence, unspecified, uncomplicated: Secondary | ICD-10-CM | POA: Insufficient documentation

## 2020-06-12 DIAGNOSIS — C8511 Unspecified B-cell lymphoma, lymph nodes of head, face, and neck: Secondary | ICD-10-CM | POA: Insufficient documentation

## 2020-06-12 HISTORY — PX: EXCISION MASS NECK: SHX6703

## 2020-06-12 SURGERY — EXCISION, MASS, NECK
Anesthesia: General | Laterality: Right

## 2020-06-12 MED ORDER — BUPIVACAINE-EPINEPHRINE (PF) 0.25% -1:200000 IJ SOLN
INTRAMUSCULAR | Status: AC
  Filled 2020-06-12: qty 30

## 2020-06-12 MED ORDER — DEXMEDETOMIDINE (PRECEDEX) IN NS 20 MCG/5ML (4 MCG/ML) IV SYRINGE
PREFILLED_SYRINGE | INTRAVENOUS | Status: AC
  Filled 2020-06-12: qty 5

## 2020-06-12 MED ORDER — ONDANSETRON HCL 4 MG/2ML IJ SOLN
INTRAMUSCULAR | Status: DC | PRN
  Administered 2020-06-12: 4 mg via INTRAVENOUS

## 2020-06-12 MED ORDER — CHLORHEXIDINE GLUCONATE 0.12 % MT SOLN
OROMUCOSAL | Status: AC
  Filled 2020-06-12: qty 15

## 2020-06-12 MED ORDER — GABAPENTIN 300 MG PO CAPS
ORAL_CAPSULE | ORAL | Status: AC
  Filled 2020-06-12: qty 1

## 2020-06-12 MED ORDER — BUPIVACAINE-EPINEPHRINE (PF) 0.5% -1:200000 IJ SOLN
INTRAMUSCULAR | Status: AC
  Filled 2020-06-12: qty 30

## 2020-06-12 MED ORDER — SUGAMMADEX SODIUM 200 MG/2ML IV SOLN
INTRAVENOUS | Status: DC | PRN
  Administered 2020-06-12: 200 mg via INTRAVENOUS

## 2020-06-12 MED ORDER — ROCURONIUM BROMIDE 100 MG/10ML IV SOLN
INTRAVENOUS | Status: DC | PRN
  Administered 2020-06-12: 50 mg via INTRAVENOUS

## 2020-06-12 MED ORDER — ONDANSETRON HCL 4 MG/2ML IJ SOLN: 4.0000 mg | Freq: Once | INTRAMUSCULAR | Status: DC | PRN

## 2020-06-12 MED ORDER — ACETAMINOPHEN 500 MG PO TABS
ORAL_TABLET | ORAL | Status: AC
  Filled 2020-06-12: qty 2

## 2020-06-12 MED ORDER — KETAMINE HCL 10 MG/ML IJ SOLN
INTRAMUSCULAR | Status: DC | PRN
  Administered 2020-06-12: 30 mg via INTRAVENOUS
  Administered 2020-06-12: 20 mg via INTRAVENOUS

## 2020-06-12 MED ORDER — FAMOTIDINE 20 MG PO TABS
ORAL_TABLET | ORAL | Status: AC
  Filled 2020-06-12: qty 1

## 2020-06-12 MED ORDER — PROPOFOL 10 MG/ML IV BOLUS
INTRAVENOUS | Status: DC | PRN
  Administered 2020-06-12: 160 mg via INTRAVENOUS
  Administered 2020-06-12: 40 mg via INTRAVENOUS

## 2020-06-12 MED ORDER — DEXAMETHASONE SODIUM PHOSPHATE 10 MG/ML IJ SOLN
INTRAMUSCULAR | Status: DC | PRN
  Administered 2020-06-12: 5 mg via INTRAVENOUS

## 2020-06-12 MED ORDER — HYDROCODONE-ACETAMINOPHEN 5-325 MG PO TABS: 1.0000 | ORAL_TABLET | Freq: Four times a day (QID) | ORAL | 0 refills | Status: DC | PRN

## 2020-06-12 MED ORDER — MIDAZOLAM HCL 2 MG/2ML IJ SOLN
INTRAMUSCULAR | Status: DC | PRN
  Administered 2020-06-12: 2 mg via INTRAVENOUS

## 2020-06-12 MED ORDER — CELECOXIB 200 MG PO CAPS
ORAL_CAPSULE | ORAL | Status: AC
  Filled 2020-06-12: qty 1

## 2020-06-12 MED ORDER — CEFAZOLIN SODIUM-DEXTROSE 2-4 GM/100ML-% IV SOLN
INTRAVENOUS | Status: AC
  Filled 2020-06-12: qty 100

## 2020-06-12 MED ORDER — MIDAZOLAM HCL 2 MG/2ML IJ SOLN
INTRAMUSCULAR | Status: AC
  Filled 2020-06-12: qty 2

## 2020-06-12 MED ORDER — METOPROLOL TARTRATE 5 MG/5ML IV SOLN
INTRAVENOUS | Status: AC
  Filled 2020-06-12: qty 5

## 2020-06-12 MED ORDER — LIDOCAINE HCL (CARDIAC) PF 100 MG/5ML IV SOSY
PREFILLED_SYRINGE | INTRAVENOUS | Status: DC | PRN
  Administered 2020-06-12: 100 mg via INTRAVENOUS

## 2020-06-12 MED ORDER — KETAMINE HCL 50 MG/5ML IJ SOSY
PREFILLED_SYRINGE | INTRAMUSCULAR | Status: AC
  Filled 2020-06-12: qty 5

## 2020-06-12 MED ORDER — FENTANYL CITRATE (PF) 100 MCG/2ML IJ SOLN: 25.0000 ug | INTRAMUSCULAR | Status: DC | PRN

## 2020-06-12 MED ORDER — DEXMEDETOMIDINE HCL 200 MCG/2ML IV SOLN
INTRAVENOUS | Status: DC | PRN
  Administered 2020-06-12: 12 ug via INTRAVENOUS
  Administered 2020-06-12: 20 ug via INTRAVENOUS
  Administered 2020-06-12: 8 ug via INTRAVENOUS

## 2020-06-12 MED ORDER — FENTANYL CITRATE (PF) 100 MCG/2ML IJ SOLN
INTRAMUSCULAR | Status: DC | PRN
  Administered 2020-06-12 (×2): 50 ug via INTRAVENOUS

## 2020-06-12 MED ORDER — BUPIVACAINE-EPINEPHRINE (PF) 0.25% -1:200000 IJ SOLN
INTRAMUSCULAR | Status: DC | PRN
  Administered 2020-06-12: 23 mL

## 2020-06-12 MED ORDER — FENTANYL CITRATE (PF) 100 MCG/2ML IJ SOLN
INTRAMUSCULAR | Status: AC
  Filled 2020-06-12: qty 2

## 2020-06-12 MED ORDER — ROCURONIUM BROMIDE 10 MG/ML (PF) SYRINGE
PREFILLED_SYRINGE | INTRAVENOUS | Status: AC
  Filled 2020-06-12: qty 10

## 2020-06-12 MED ORDER — BUPIVACAINE HCL (PF) 0.5 % IJ SOLN
INTRAMUSCULAR | Status: AC
  Filled 2020-06-12: qty 30

## 2020-06-12 MED ORDER — PROPOFOL 10 MG/ML IV BOLUS
INTRAVENOUS | Status: AC
  Filled 2020-06-12: qty 20

## 2020-06-12 SURGICAL SUPPLY — 35 items
APPLIER CLIP 11 MED OPEN (CLIP) ×2
APPLIER CLIP 13 LRG OPEN (CLIP) ×2
APPLIER CLIP 9.375 SM OPEN (CLIP) ×2
BLADE CLIPPER SURG (BLADE) IMPLANT
CHLORAPREP W/TINT 26 (MISCELLANEOUS) ×2 IMPLANT
CLIP APPLIE 11 MED OPEN (CLIP) ×1 IMPLANT
CLIP APPLIE 13 LRG OPEN (CLIP) ×1 IMPLANT
CLIP APPLIE 9.375 SM OPEN (CLIP) ×1 IMPLANT
CNTNR SPEC 2.5X3XGRAD LEK (MISCELLANEOUS) ×2
CONT SPEC 4OZ STER OR WHT (MISCELLANEOUS) ×2
CONTAINER SPEC 2.5X3XGRAD LEK (MISCELLANEOUS) ×2 IMPLANT
COVER WAND RF STERILE (DRAPES) ×2 IMPLANT
DERMABOND ADVANCED (GAUZE/BANDAGES/DRESSINGS) ×1
DERMABOND ADVANCED .7 DNX12 (GAUZE/BANDAGES/DRESSINGS) ×1 IMPLANT
DRAPE LAPAROTOMY 100X77 ABD (DRAPES) ×2 IMPLANT
ELECT CAUTERY BLADE TIP 2.5 (TIP) ×2
ELECT REM PT RETURN 9FT ADLT (ELECTROSURGICAL) ×2
ELECTRODE CAUTERY BLDE TIP 2.5 (TIP) ×1 IMPLANT
ELECTRODE REM PT RTRN 9FT ADLT (ELECTROSURGICAL) ×1 IMPLANT
GLOVE SURG ENC MOIS LTX SZ7 (GLOVE) ×2 IMPLANT
GOWN STRL REUS W/ TWL LRG LVL3 (GOWN DISPOSABLE) ×2 IMPLANT
GOWN STRL REUS W/TWL LRG LVL3 (GOWN DISPOSABLE) ×2
MANIFOLD NEPTUNE II (INSTRUMENTS) ×2 IMPLANT
NEEDLE HYPO 22GX1.5 SAFETY (NEEDLE) ×2 IMPLANT
PACK BASIN MINOR ARMC (MISCELLANEOUS) ×2 IMPLANT
SPONGE KITTNER 5P (MISCELLANEOUS) ×2 IMPLANT
SPONGE LAP 18X18 RF (DISPOSABLE) ×2 IMPLANT
SUT MNCRL 4-0 (SUTURE) ×1
SUT MNCRL 4-0 27XMFL (SUTURE) ×1
SUT PLAIN 3 0 SH 27IN (SUTURE) ×2 IMPLANT
SUT VIC AB 3-0 SH 27 (SUTURE) ×1
SUT VIC AB 3-0 SH 27X BRD (SUTURE) ×1 IMPLANT
SUTURE MNCRL 4-0 27XMF (SUTURE) ×1 IMPLANT
SYR 10ML LL (SYRINGE) ×2 IMPLANT
SYR 20ML LL LF (SYRINGE) ×2 IMPLANT

## 2020-06-12 NOTE — Discharge Instructions (Addendum)

## 2020-06-12 NOTE — Op Note (Signed)
PROCEDURES: Excisional biopsy Right Neck mass, subfascial measuring 3.5cms   Pre-operative Diagnosis: Lymphadenopathy, r/o lymphoma  Post-operative Diagnosis: Same  Surgeon: Marjory Lies Keeghan Bialy  Anesthesia: General endotracheal anesthesia  ASA Class: 2   Surgeon: Caroleen Hamman , MD FACS  Anesthesia: Gen. with endotracheal tube   Findings: Right supraclavicular adenopathy, level III multiple additional small LAD on III, IV   Estimated Blood Loss: 5cc         Drains: none         Specimens: node          Complications: none               Condition: stable  Procedure Details  The patient was seen again in the Holding Room. The benefits, complications, treatment options, and expected outcomes were discussed with the patient. The risks of bleeding, infection, recurrence of symptoms, failure to resolve symptoms,  bowel injury, any of which could require further surgery were reviewed with the patient.   The patient was taken to Operating Room, identified as Margot Chimes and the procedure verified.  A Time Out was held and the above information confirmed.  Prior to the induction of general anesthesia, antibiotic prophylaxis was administered. VTE prophylaxis was in place. General endotracheal anesthesia was then administered and tolerated well. After the induction, the abdomen was prepped with Chloraprep and draped in the sterile fashion. The patient was positioned in the supine position.  Supraclavicular incision was created using 15 blade knife.  Electrocautery was used to dissect through subcutaneous tissue and the platysma fibers were identified and divided.  The right external jugular vein was identified dissected and retracted laterally.Omohyod muscle was identified and retracted, Mass laied posterior to the omohyoid, all nerve and vascular structures were preserved.  Lymphatic channels were clipped and divided in the standard fashion and the mass was excised.  There was an additional  lymph node next to it that I was able to excise as well.  Adequate hemostasis was obtained with electrocautery but being very judicious on its use.  Specimen was sent for lymphoma work up.  Wound was closed in multiple layers with 3-0 Vicryl for the fascia and 4-0 Monocryl in a subcuticular fashion for skin.  Marcaine quarter percent with epinephrine was injected for postoperative analgesia  Dermabond was used to coat all the skin incisions. Needle and laparotomy count were correct and there were no immediate occasions  Caroleen Hamman, MD, FACS

## 2020-06-12 NOTE — Anesthesia Procedure Notes (Signed)
Procedure Name: Intubation Date/Time: 06/12/2020 9:20 AM Performed by: Lia Foyer, CRNA Pre-anesthesia Checklist: Patient identified, Emergency Drugs available, Suction available and Patient being monitored Patient Re-evaluated:Patient Re-evaluated prior to induction Oxygen Delivery Method: Circle system utilized Preoxygenation: Pre-oxygenation with 100% oxygen Induction Type: IV induction Ventilation: Mask ventilation without difficulty Laryngoscope Size: McGraph and 3 Grade View: Grade II Tube type: Oral Tube size: 7.5 mm Number of attempts: 1 Airway Equipment and Method: Stylet and Video-laryngoscopy Placement Confirmation: ETT inserted through vocal cords under direct vision,  positive ETCO2 and breath sounds checked- equal and bilateral Secured at: 24 cm Tube secured with: Tape Dental Injury: Teeth and Oropharynx as per pre-operative assessment

## 2020-06-12 NOTE — Interval H&P Note (Signed)
History and Physical Interval Note:  06/12/2020 7:47 AM  Christopher Mills  has presented today for surgery, with the diagnosis of lymphadenopathy.  The various methods of treatment have been discussed with the patient and family. After consideration of risks, benefits and other options for treatment, the patient has consented to  Procedure(s): EXCISION MASS NECK (Right) as a surgical intervention.  The patient's history has been reviewed, patient examined, no change in status, stable for surgery.  I have reviewed the patient's chart and labs.  Questions were answered to the patient's satisfaction.     [place]

## 2020-06-12 NOTE — Anesthesia Postprocedure Evaluation (Signed)
Anesthesia Post Note  Patient: Christopher Mills  Procedure(s) Performed: EXCISION MASS NECK (Right )  Patient location during evaluation: PACU Anesthesia Type: General Level of consciousness: awake and alert Pain management: pain level controlled Vital Signs Assessment: post-procedure vital signs reviewed and stable Respiratory status: spontaneous breathing, nonlabored ventilation, respiratory function stable and patient connected to nasal cannula oxygen Cardiovascular status: blood pressure returned to baseline and stable Postop Assessment: no apparent nausea or vomiting Anesthetic complications: no   No complications documented.   Last Vitals:  Vitals:   06/12/20 1100 06/12/20 1111  BP:  (!) 146/84  Pulse: 75 73  Resp: 12 16  Temp:  (!) 36 C  SpO2: 99% 100%    Last Pain:  Vitals:   06/12/20 1111  TempSrc: Temporal  PainSc: 0-No pain                 Arita Miss

## 2020-06-12 NOTE — Anesthesia Preprocedure Evaluation (Signed)
Anesthesia Evaluation  Patient identified by MRN, date of birth, ID band Patient awake    Reviewed: Allergy & Precautions, NPO status , Patient's Chart, lab work & pertinent test results  Airway Mallampati: II  TM Distance: >3 FB     Dental  (+) Teeth Intact   Pulmonary Current Smoker and Patient abstained from smoking.,    Pulmonary exam normal        Cardiovascular hypertension, Normal cardiovascular exam     Neuro/Psych negative neurological ROS  negative psych ROS   GI/Hepatic Neg liver ROS,   Endo/Other  negative endocrine ROS  Renal/GU negative Renal ROS  negative genitourinary   Musculoskeletal negative musculoskeletal ROS (+)   Abdominal Normal abdominal exam  (+)   Peds negative pediatric ROS (+)  Hematology negative hematology ROS (+)   Anesthesia Other Findings Past Medical History: No date: Hypertension  Reproductive/Obstetrics                             Anesthesia Physical Anesthesia Plan  ASA: II  Anesthesia Plan: General   Post-op Pain Management:    Induction: Intravenous  PONV Risk Score and Plan:   Airway Management Planned: Oral ETT  Additional Equipment:   Intra-op Plan:   Post-operative Plan: Extubation in OR  Informed Consent: I have reviewed the patients History and Physical, chart, labs and discussed the procedure including the risks, benefits and alternatives for the proposed anesthesia with the patient or authorized representative who has indicated his/her understanding and acceptance.     Dental advisory given  Plan Discussed with: CRNA and Surgeon  Anesthesia Plan Comments:         Anesthesia Quick Evaluation

## 2020-06-12 NOTE — Transfer of Care (Signed)
Immediate Anesthesia Transfer of Care Note   Patient: Christopher Mills  Procedure(s) Performed: EXCISION MASS NECK (Right )  Patient Location: PACU  Anesthesia Type:General  Level of Consciousness: drowsy  Airway & Oxygen Therapy: Patient Spontanous Breathing and Patient connected to face mask oxygen  Post-op Assessment: Report given to RN and Post -op Vital signs reviewed and stable  Post vital signs: Reviewed and stable  Last Vitals:  Vitals Value Taken Time  BP 150/95 06/12/20 1025  Temp 36 C 06/12/20 1025  Pulse 76 06/12/20 1027  Resp 17 06/12/20 1027  SpO2 100 % 06/12/20 1027  Vitals shown include unvalidated device data.  Last Pain:  Vitals:   06/12/20 1025  TempSrc:   PainSc: Asleep      Patients Stated Pain Goal: 0 (43/27/61 4709)  Complications: No complications documented.

## 2020-06-16 ENCOUNTER — Encounter: Payer: Self-pay | Admitting: Surgery

## 2020-06-16 ENCOUNTER — Ambulatory Visit (INDEPENDENT_AMBULATORY_CARE_PROVIDER_SITE_OTHER): Payer: Self-pay | Admitting: Surgery

## 2020-06-16 ENCOUNTER — Telehealth: Payer: Self-pay | Admitting: *Deleted

## 2020-06-16 ENCOUNTER — Other Ambulatory Visit: Payer: Self-pay

## 2020-06-16 ENCOUNTER — Other Ambulatory Visit: Payer: Self-pay | Admitting: *Deleted

## 2020-06-16 VITALS — BP 107/67 | HR 76 | Temp 98.0°F | Ht 73.0 in | Wt 188.4 lb

## 2020-06-16 DIAGNOSIS — R591 Generalized enlarged lymph nodes: Secondary | ICD-10-CM

## 2020-06-16 DIAGNOSIS — C8591 Non-Hodgkin lymphoma, unspecified, lymph nodes of head, face, and neck: Secondary | ICD-10-CM

## 2020-06-16 MED ORDER — HYDROCODONE-ACETAMINOPHEN 5-325 MG PO TABS: 1.0000 | ORAL_TABLET | Freq: Four times a day (QID) | ORAL | Status: DC | PRN

## 2020-06-16 MED ORDER — HYDROCODONE-ACETAMINOPHEN 5-325 MG PO TABS
1.0000 | ORAL_TABLET | Freq: Four times a day (QID) | ORAL | 0 refills | Status: DC | PRN
Start: 2020-06-16 — End: 2021-07-29

## 2020-06-16 NOTE — Progress Notes (Signed)
amb  

## 2020-06-16 NOTE — Progress Notes (Signed)
POD # 3s/p right excision of of neck lymphadenopathy. He is anxious.  He did have some swelling and reports severe constipation.  Apparently he did not get any of his prescription for pain medicine. Apparently had difficulty swallowing but now has improved   PE NAD Neck incision c/d/i, no evidence of expanding hematoma. Some postoperative changes. No infection or complications  A/P Doing well overall, you think that the swallowing issue is likely related to anxiety.  There is no objective evidence of complications.  Pending pathology at this time. We did call the pharmacy and he never filled his prescription.  He does not know whether this was lost .  Again refill prescription azelastine minimally 15 pills of Norco 5/325 I will call him w path results.  He is very [employer] attorney

## 2020-06-16 NOTE — H&P (View-Only) (Signed)
POD # 3s/p right excision of of neck lymphadenopathy. He is anxious.  He did have some swelling and reports severe constipation.  Apparently he did not get any of his prescription for pain medicine. Apparently had difficulty swallowing but now has improved   PE NAD Neck incision c/d/i, no evidence of expanding hematoma. Some postoperative changes. No infection or complications  A/P Doing well overall, you think that the swallowing issue is likely related to anxiety.  There is no objective evidence of complications.  Pending pathology at this time. We did call the pharmacy and he never filled his prescription.  He does not know whether this was lost .  Again refill prescription azelastine minimally 15 pills of Norco 5/325 I will call him w path results.  He is very [employer] attorney

## 2020-06-16 NOTE — Telephone Encounter (Addendum)
Call patient and let him know that Dr. Janese Banks had talked to the pathologist and they do have a diagnosis but it has not resulted in the computer yet.  Dr. Janese Banks had asked me to give patient a call and see if the he could come over tomorrow and be seen.  Patient agreeable as long as it is in the morning because he has to go get his grandkid in the afternoon.  Appointment given for 10:45 tomorrow 4/5. and agreeable to visit

## 2020-06-16 NOTE — Patient Instructions (Signed)
If you have any concerns or questions, please feel free to contact our office.     Excision of Skin Lesions, Care After This sheet gives you information about how to care for yourself after your procedure. Your health care provider may also give you more specific instructions. If you have problems or questions, contact your health care provider. What can I expect after the procedure? After your procedure, it is common to have pain or discomfort at the excision site. Follow these instructions at home: Excision care  Follow instructions from your health care provider about how to take care of your excision site. Make sure you: ? Wash your hands with soap and water before and after you change your bandage (dressing). If soap and water are not available, use hand sanitizer. ? Change your dressing as told by your health care provider. ? Leave stitches (sutures), skin glue, or adhesive strips in place. These skin closures may need to stay in place for 2 weeks or longer. If adhesive strip edges start to loosen and curl up, you may trim the loose edges. Do not remove adhesive strips completely unless your health care provider tells you to do that.  Check the excision area every day for signs of infection. Watch for: ? Redness, swelling, or pain. ? Fluid or blood. ? Warmth. ? Pus or a bad smell.  Keep the site clean, dry, and protected for at least 48 hours.  For bleeding, apply gentle but firm pressure to the area using a folded towel for 20 minutes.  Avoid high-impact exercise and activities until the sutures are removed or the area heals.   General instructions  Take over-the-counter and prescription medicines only as told by your health care provider.  Follow instructions from your health care provider about how to minimize scarring. Scarring should lessen over time.  Avoid sun exposure until the area has healed. Use sunscreen to protect the area from the sun after it has healed.  Keep  all follow-up visits as told by your health care provider. This is important. Contact a health care provider if:  You have redness, swelling, or pain around your excision site.  You have fluid or blood coming from your excision site.  Your excision site feels warm to the touch.  You have pus or a bad smell coming from your excision site.  You have a fever.  You have pain that does not improve in 2-3 days after your procedure.  You notice skin irregularities or changes in how you feel (sensation). Summary  This sheet of instructions provides you with information about caring for yourself after your procedure. Contact your health care provider if you have any problems or questions.  Take over-the-counter and prescription medicines only as told by your health care provider.  Change your dressing as told by your health care provider.  Contact a health care provider if you have redness, swelling, pain, or other signs of infection around your excision site.  Keep all follow-up visits as told by your health care provider. This is important. This information is not intended to replace advice given to you by your health care provider. Make sure you discuss any questions you have with your health care provider. Document Revised: 09/07/2017 Document Reviewed: 09/07/2017 Elsevier Patient Education  [place].

## 2020-06-17 ENCOUNTER — Encounter: Payer: Self-pay | Admitting: Oncology

## 2020-06-17 ENCOUNTER — Inpatient Hospital Stay: Payer: Self-pay

## 2020-06-17 ENCOUNTER — Inpatient Hospital Stay: Payer: Self-pay | Attending: Oncology | Admitting: Oncology

## 2020-06-17 ENCOUNTER — Other Ambulatory Visit: Payer: Self-pay

## 2020-06-17 VITALS — BP 114/74 | HR 73 | Temp 98.1°F | Resp 16 | Wt 187.0 lb

## 2020-06-17 DIAGNOSIS — Z8249 Family history of ischemic heart disease and other diseases of the circulatory system: Secondary | ICD-10-CM | POA: Insufficient documentation

## 2020-06-17 DIAGNOSIS — C8591 Non-Hodgkin lymphoma, unspecified, lymph nodes of head, face, and neck: Secondary | ICD-10-CM

## 2020-06-17 DIAGNOSIS — F17210 Nicotine dependence, cigarettes, uncomplicated: Secondary | ICD-10-CM | POA: Insufficient documentation

## 2020-06-17 DIAGNOSIS — C8510 Unspecified B-cell lymphoma, unspecified site: Secondary | ICD-10-CM

## 2020-06-17 DIAGNOSIS — R5383 Other fatigue: Secondary | ICD-10-CM | POA: Insufficient documentation

## 2020-06-17 DIAGNOSIS — F1290 Cannabis use, unspecified, uncomplicated: Secondary | ICD-10-CM | POA: Insufficient documentation

## 2020-06-17 DIAGNOSIS — Z7189 Other specified counseling: Secondary | ICD-10-CM

## 2020-06-17 DIAGNOSIS — C8338 Diffuse large B-cell lymphoma, lymph nodes of multiple sites: Secondary | ICD-10-CM | POA: Insufficient documentation

## 2020-06-17 DIAGNOSIS — Z5189 Encounter for other specified aftercare: Secondary | ICD-10-CM | POA: Insufficient documentation

## 2020-06-17 DIAGNOSIS — Z87891 Personal history of nicotine dependence: Secondary | ICD-10-CM | POA: Insufficient documentation

## 2020-06-17 DIAGNOSIS — B1910 Unspecified viral hepatitis B without hepatic coma: Secondary | ICD-10-CM | POA: Insufficient documentation

## 2020-06-17 DIAGNOSIS — Z5112 Encounter for antineoplastic immunotherapy: Secondary | ICD-10-CM | POA: Insufficient documentation

## 2020-06-17 DIAGNOSIS — Z79899 Other long term (current) drug therapy: Secondary | ICD-10-CM | POA: Insufficient documentation

## 2020-06-17 DIAGNOSIS — R197 Diarrhea, unspecified: Secondary | ICD-10-CM | POA: Insufficient documentation

## 2020-06-17 DIAGNOSIS — Z832 Family history of diseases of the blood and blood-forming organs and certain disorders involving the immune mechanism: Secondary | ICD-10-CM | POA: Insufficient documentation

## 2020-06-17 DIAGNOSIS — I700 Atherosclerosis of aorta: Secondary | ICD-10-CM | POA: Insufficient documentation

## 2020-06-17 DIAGNOSIS — Z5111 Encounter for antineoplastic chemotherapy: Secondary | ICD-10-CM | POA: Insufficient documentation

## 2020-06-17 DIAGNOSIS — Z809 Family history of malignant neoplasm, unspecified: Secondary | ICD-10-CM | POA: Insufficient documentation

## 2020-06-17 DIAGNOSIS — Z8269 Family history of other diseases of the musculoskeletal system and connective tissue: Secondary | ICD-10-CM | POA: Insufficient documentation

## 2020-06-17 LAB — COMPREHENSIVE METABOLIC PANEL
ALT: 35 U/L (ref 0–44)
AST: 52 U/L — ABNORMAL HIGH (ref 15–41)
Albumin: 4.2 g/dL (ref 3.5–5.0)
Alkaline Phosphatase: 94 U/L (ref 38–126)
Anion gap: 11 (ref 5–15)
BUN: 17 mg/dL (ref 6–20)
CO2: 25 mmol/L (ref 22–32)
Calcium: 10.1 mg/dL (ref 8.9–10.3)
Chloride: 102 mmol/L (ref 98–111)
Creatinine, Ser: 1.01 mg/dL (ref 0.61–1.24)
GFR, Estimated: >60 mL/min (ref >60)
Glucose, Bld: 89 mg/dL (ref 70–99)
Potassium: 4.3 mmol/L (ref 3.5–5.1)
Sodium: 138 mmol/L (ref 135–145)
Total Bilirubin: 0.5 mg/dL (ref 0.3–1.2)
Total Protein: 7.7 g/dL (ref 6.5–8.1)

## 2020-06-17 LAB — CBC WITH DIFFERENTIAL/PLATELET
Abs Immature Granulocytes: 0.01 10*3/uL (ref 0.00–0.07)
Basophils Absolute: 0 10*3/uL (ref 0.0–0.1)
Basophils Relative: 1 %
Eosinophils Absolute: 0 10*3/uL (ref 0.0–0.5)
Eosinophils Relative: 1 %
HCT: 41.5 % (ref 39.0–52.0)
Hemoglobin: 14.2 g/dL (ref 13.0–17.0)
Immature Granulocytes: 0 %
Lymphocytes Relative: 47 %
Lymphs Abs: 1.8 10*3/uL (ref 0.7–4.0)
MCH: 29.6 pg (ref 26.0–34.0)
MCHC: 34.2 g/dL (ref 30.0–36.0)
MCV: 86.5 fL (ref 80.0–100.0)
Monocytes Absolute: 0.5 10*3/uL (ref 0.1–1.0)
Monocytes Relative: 13 %
Neutro Abs: 1.5 10*3/uL — ABNORMAL LOW (ref 1.7–7.7)
Neutrophils Relative %: 38 %
Platelets: 239 10*3/uL (ref 150–400)
RBC: 4.8 MIL/uL (ref 4.22–5.81)
RDW: 13.4 % (ref 11.5–15.5)
WBC: 3.8 10*3/uL — ABNORMAL LOW (ref 4.0–10.5)
nRBC: 0 % (ref 0.0–0.2)

## 2020-06-17 LAB — LACTATE DEHYDROGENASE: LDH: 1318 U/L — ABNORMAL HIGH (ref 98–192)

## 2020-06-17 LAB — HEPATITIS B SURFACE ANTIGEN: Hepatitis B Surface Ag: NONREACTIVE

## 2020-06-18 ENCOUNTER — Telehealth: Payer: Self-pay | Admitting: Surgery

## 2020-06-18 ENCOUNTER — Encounter: Payer: Self-pay | Admitting: Oncology

## 2020-06-18 DIAGNOSIS — C8591 Non-Hodgkin lymphoma, unspecified, lymph nodes of head, face, and neck: Secondary | ICD-10-CM | POA: Insufficient documentation

## 2020-06-18 DIAGNOSIS — C8510 Unspecified B-cell lymphoma, unspecified site: Secondary | ICD-10-CM | POA: Insufficient documentation

## 2020-06-18 HISTORY — DX: Non-Hodgkin lymphoma, unspecified, lymph nodes of head, face, and neck: C85.91

## 2020-06-18 LAB — HEPATITIS B CORE ANTIBODY, TOTAL: Hep B Core Total Ab: REACTIVE — AB

## 2020-06-18 LAB — HEPATITIS B SURFACE ANTIBODY, QUANTITATIVE: Hep B S AB Quant (Post): 45.9 m[IU]/mL (ref >9.9)

## 2020-06-18 MED ORDER — ALLOPURINOL 300 MG PO TABS: 300.0000 mg | ORAL_TABLET | Freq: Every day | ORAL | 3 refills | Status: DC

## 2020-06-18 MED ORDER — PREDNISONE 50 MG PO TABS: 100.0000 mg | ORAL_TABLET | Freq: Every day | ORAL | 0 refills | Status: AC

## 2020-06-18 NOTE — Telephone Encounter (Signed)
Thoroughly discussed with the patient in detail.  Also case discussed with Dr. Janese Banks.  Planning on chemotherapy as soon as possible.  I talked to the patient about placing a Port-A-Cath next week and he is in agreement.  I discussed with patient detail.  Risks, benefits and possible medications.  Understands and wishes to proceed

## 2020-06-18 NOTE — Addendum Note (Signed)
Addended by: Caroleen Hamman F on: 06/18/2020 03:31 PM   Modules accepted: Orders, SmartSet

## 2020-06-18 NOTE — Progress Notes (Signed)
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  This was followed by a PET CT scan which showed extensive FDG avid adenopathy in the neck chest abdomen and pelvis.  FDG avid splenic lesions.  Solitary intramuscular FDG avid lesion in the right biceps femoris muscle.  Interval history-patient reports ongoing fatigue.  Denies any other complaints at this time.  ECOG PS- 1 Pain scale- 0   Review of systems- Review of Systems  Constitutional: Positive for malaise/fatigue. Negative for chills, fever and weight loss.  HENT: Negative for congestion, ear discharge and nosebleeds.   Eyes: Negative for blurred vision.  Respiratory: Negative for cough, hemoptysis, sputum production, shortness of breath and wheezing.   Cardiovascular: Negative for chest pain, palpitations, orthopnea and claudication.  Gastrointestinal: Negative for abdominal pain, blood in stool, constipation, diarrhea, heartburn, melena, nausea and vomiting.  Genitourinary: Negative for dysuria, flank pain, frequency, hematuria and urgency.  Musculoskeletal: Negative for back pain, joint pain and myalgias.  Skin: Negative for rash.  Neurological: Negative for dizziness, tingling, focal weakness, seizures, weakness and headaches.   Endo/Heme/Allergies: Does not bruise/bleed easily.  Psychiatric/Behavioral: Negative for depression and suicidal ideas. The patient does not have insomnia.        No Known Allergies   Past Medical History:  Diagnosis Date  . Hypertension      Past Surgical History:  Procedure Laterality Date  . COLONOSCOPY    . EXCISION MASS NECK Right 06/12/2020   Procedure: EXCISION MASS NECK;  Surgeon: Jules Husbands, MD;  Location: [facility] ORS;  Service: General;  Laterality: Right;  . HERNIA REPAIR Right    at age 40-[facility]    Social History   Socioeconomic History  . Marital status: Single    Spouse name: Not on file  . Number of children: Not on file  . Years of education: Not on file  . Highest education level: Not on file  Occupational History  . Not on file  Tobacco Use  . Smoking status: Current Some Day Smoker    Types: Cigarettes  . Smokeless tobacco: Never Used  Vaping Use  . Vaping Use: Never used  Substance and Sexual Activity  . Alcohol use: Yes    Comment: 1 PINT TO 1 FIFTH OF LIQUOR ON WEEKEND  . Drug use: Never  . Sexual activity: Not Currently  Other Topics Concern  . Not on file  Social History Narrative  . Not on file   Social Determinants of Health   Financial Resource Strain: Not on file  Food Insecurity: Not on file  Transportation Needs: Not on file  Physical Activity: Not on file  Stress: Not on file  Social Connections: Not on file  Intimate Partner Violence: Not on file    Family History  Problem Relation Age of Onset  . Anemia Mother   . Hypertension Mother   . Goiter  Mother   . Cancer Father   . Cancer Sister   . Multiple sclerosis Sister      Current Outpatient Medications:  .  aspirin EC 81 MG tablet, Take 81 mg by mouth daily as needed (heart health). Swallow whole., Disp: , Rfl:  .  atenolol (TENORMIN) 25 MG tablet, Take 25 mg by mouth daily., Disp: , Rfl:  .  lisinopril (ZESTRIL) 10 MG tablet, Take 10 mg by mouth daily., Disp:  , Rfl:  .  VITAMIN D PO, Take 1 capsule by mouth daily., Disp: , Rfl:  .  HYDROcodone-acetaminophen (NORCO/VICODIN) 5-325 MG tablet, Take 1 tablet by mouth every 6 (six) hours as needed for moderate pain. (Patient not taking: Reported on 06/17/2020), Disp: 15 tablet, Rfl: 0  Physical exam:  Vitals:   06/17/20 1117  BP: 114/74  Pulse: 73  Resp: 16  Temp: 98.1 F (36.7 C)  TempSrc: Oral  Weight: 187 lb (84.8 kg)   Physical Exam Cardiovascular:     Rate and Rhythm: Normal rate and regular rhythm.  Pulmonary:     Effort: Pulmonary effort is normal.  Skin:    General: Skin is warm and dry.  Neurological:     Mental Status: He is alert and oriented to person, place, and time.      CMP Latest Ref Rng & Units 06/17/2020  Glucose 70 - 99 mg/dL 89  BUN 6 - 20 mg/dL 17  Creatinine 0.61 - 1.24 mg/dL 1.01  Sodium 135 - 145 mmol/L 138  Potassium 3.5 - 5.1 mmol/L 4.3  Chloride 98 - 111 mmol/L 102  CO2 22 - 32 mmol/L 25  Calcium 8.9 - 10.3 mg/dL 10.1  Total Protein 6.5 - 8.1 g/dL 7.7  Total Bilirubin 0.3 - 1.2 mg/dL 0.5  Alkaline Phos 38 - 126 U/L 94  AST 15 - 41 U/L 52(H)  ALT 0 - 44 U/L 35   CBC Latest Ref Rng & Units 06/17/2020  WBC 4.0 - 10.5 K/uL 3.8(L)  Hemoglobin 13.0 - 17.0 g/dL 14.2  Hematocrit 39.0 - 52.0 % 41.5  Platelets 150 - 400 K/uL 239    No images are attached to the encounter.  NM PET Image Initial (PI) Skull Base To Thigh  Result Date: 05/26/2020 CLINICAL DATA:  Initial treatment strategy for adenopathy and splenic mass. EXAM: NUCLEAR MEDICINE PET SKULL BASE TO THIGH TECHNIQUE: 9.7 mCi F-18 FDG was injected intravenously. Full-ring PET imaging was performed from the skull base to thigh after the radiotracer. CT data was obtained and used for attenuation correction and anatomic localization. Fasting blood glucose: 104 mg/dl COMPARISON:  None. FINDINGS: Mediastinal blood pool activity: SUV max 2.66 Liver activity: SUV max NA NECK: Bilateral FDG avid cervical lymph  nodes are identified, including: -Index right level 3/4 node measures 1.7 cm and has an SUV max of 21.62, image 44/4. -Left level 3/4 node measures 1.1 cm within SUV max of 4.7, image 43/4. Incidental CT findings: none CHEST: FDG avid supraclavicular, mediastinal, and right hilar lymph nodes are identified including: -right supraclavicular lymph node measuring 2.9 cm with SUV max of 22, image 47/4. -conglomerate nodal mass within the superior mediastinum measures 4.4 x 5.7 cm and has an SUV max of 21.58, image 66/4. Right lower paratracheal lymph node measures 2.9 cm and has an SUV max of 20.12, image 72/4. -subcarinal lymph node measures 3 cm and has an SUV max of 23.47, image 80/4. -right hilar lymph node measures 1.1 cm and has an SUV max  of 12.83, image 79/4. No pleural effusion. No airspace consolidation, atelectasis, or pneumothorax. Incidental CT findings: Mild aortic atherosclerosis. ABDOMEN/PELVIS: No abnormal FDG uptake within the liver, pancreas or adrenal glands. Normal size spleen contains multiple FDG avid low-attenuation lesions. The largest measures 5.9 cm with SUV max of 17.4, image 112/4. FDG avid abdominopelvic lymph nodes are identified, including: -gastrohepatic ligament node measuring 1.4 cm with SUV max of 19.37, image 112/4. -Left periaortic node measures 1.3 cm within SUV max of 24.39, image 146/4. -right pelvic sidewall lymph node measures 3.5 cm with SUV max of 24.88, image 175/4. -Left inguinal lymph node measures 1.3 cm within SUV max of 14.03, image 200/4. Incidental CT findings: No ascites. Aortic atherosclerosis without aneurysm. SKELETON: FDG avid intramuscular lesion within the right biceps femoris muscle measures 3.1 cm and has an SUV max of 28.6, image 214/4. No FDG avid osseous lesions. Incidental CT findings: none IMPRESSION: 1. Examination is positive for extensive FDG avid adenopathy within the neck, chest, abdomen, and pelvis. Additionally, there are multiple FDG avid splenic  lesions. 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 Correlation with tissue sampling advised. 2. Solitary intramuscular FDG avid lesion within the right lower extremity. Also concerning for lymphoma. 3.  Aortic Atherosclerosis (ICD10-I70.0). Electronically Signed   By: Kerby Moors M.D.   On: 05/26/2020 10:33     Assessment and plan- Patient is a 60 y.o. 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  Explained to the patient that he has a high-grade B-cell lymphoma which requires urgent treatment especially given his LDH that is significantly elevated.Based on his labs today he does not have any evidence of tumor lysis syndrome.  FISH studies for double hit lymphoma are presently pending.  If he has no evidence of double hit I would recommend 6 cycles of R-CHOP IV every 3 weeks.  Discussed risks and benefits of chemotherapy including all but not limited to nausea, vomiting, low blood counts, risk of infections and hospitalization.  Risk of cardiotoxicity associated with anthracyclines.  We will obtain baseline hepatitis B testing today.  I will start him on 5 days of prednisone 100 mg daily along with allopurinol 300 mg daily.  I would ideally like him to get a port placed, echocardiogram chemo teach and start chemotherapy next week.  However patient is overwhelmed with all this information and he would like to discuss this with his primary care Dr. Rosario Jacks as well as his sister before making any decisions.  He would also need bone marrow biopsy to complete his staging work-up.  We will reach out to him in a day or 2 if he has made  up his mind about starting treatment.  I have also personally spoken to Dr. Rosario Jacks to try and convince the patient to start treatment.  Lymphoma is highly treatable and it would be prudent for patient to start treatment as soon as possible.   Total face to face encounter time for this patient visit was 45 min.  Total non face to face encounter time for this patient on the day of the visit was 15 min. Time spent in discussing patients case with Dr. Rosario Jacks  Visit Diagnosis 1. High grade B-cell lymphoma ([place])   2. Goals of care, counseling/discussion      Dr. Randa Evens, MD, MPH [facility] [phone] 06/18/2020 11:05 AM

## 2020-06-18 NOTE — H&P (View-Only) (Signed)
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  This was followed by a PET CT scan which showed extensive FDG avid adenopathy in the neck chest abdomen and pelvis.  FDG avid splenic lesions.  Solitary intramuscular FDG avid lesion in the right biceps femoris muscle.  Interval history-patient reports ongoing fatigue.  Denies any other complaints at this time.  ECOG PS- 1 Pain scale- 0   Review of systems- Review of Systems  Constitutional: Positive for malaise/fatigue. Negative for chills, fever and weight loss.  HENT: Negative for congestion, ear discharge and nosebleeds.   Eyes: Negative for blurred vision.  Respiratory: Negative for cough, hemoptysis, sputum production, shortness of breath and wheezing.   Cardiovascular: Negative for chest pain, palpitations, orthopnea and claudication.  Gastrointestinal: Negative for abdominal pain, blood in stool, constipation, diarrhea, heartburn, melena, nausea and vomiting.  Genitourinary: Negative for dysuria, flank pain, frequency, hematuria and urgency.  Musculoskeletal: Negative for back pain, joint pain and myalgias.  Skin: Negative for rash.  Neurological: Negative for dizziness, tingling, focal weakness, seizures, weakness and headaches.   Endo/Heme/Allergies: Does not bruise/bleed easily.  Psychiatric/Behavioral: Negative for depression and suicidal ideas. The patient does not have insomnia.        No Known Allergies   Past Medical History:  Diagnosis Date  . Hypertension      Past Surgical History:  Procedure Laterality Date  . COLONOSCOPY    . EXCISION MASS NECK Right 06/12/2020   Procedure: EXCISION MASS NECK;  Surgeon: Jules Husbands, MD;  Location: [facility] ORS;  Service: General;  Laterality: Right;  . HERNIA REPAIR Right    at age 40-[facility]H    Social History   Socioeconomic History  . Marital status: Single    Spouse name: Not on file  . Number of children: Not on file  . Years of education: Not on file  . Highest education level: Not on file  Occupational History  . Not on file  Tobacco Use  . Smoking status: Current Some Day Smoker    Types: Cigarettes  . Smokeless tobacco: Never Used  Vaping Use  . Vaping Use: Never used  Substance and Sexual Activity  . Alcohol use: Yes    Comment: 1 PINT TO 1 FIFTH OF LIQUOR ON WEEKEND  . Drug use: Never  . Sexual activity: Not Currently  Other Topics Concern  . Not on file  Social History Narrative  . Not on file   Social Determinants of Health   Financial Resource Strain: Not on file  Food Insecurity: Not on file  Transportation Needs: Not on file  Physical Activity: Not on file  Stress: Not on file  Social Connections: Not on file  Intimate Partner Violence: Not on file    Family History  Problem Relation Age of Onset  . Anemia Mother   . Hypertension Mother   . Goiter  Mother   . Cancer Father   . Cancer Sister   . Multiple sclerosis Sister      Current Outpatient Medications:  .  aspirin EC 81 MG tablet, Take 81 mg by mouth daily as needed (heart health). Swallow whole., Disp: , Rfl:  .  atenolol (TENORMIN) 25 MG tablet, Take 25 mg by mouth daily., Disp: , Rfl:  .  lisinopril (ZESTRIL) 10 MG tablet, Take 10 mg by mouth daily., Disp:  , Rfl:  .  VITAMIN D PO, Take 1 capsule by mouth daily., Disp: , Rfl:  .  HYDROcodone-acetaminophen (NORCO/VICODIN) 5-325 MG tablet, Take 1 tablet by mouth every 6 (six) hours as needed for moderate pain. (Patient not taking: Reported on 06/17/2020), Disp: 15 tablet, Rfl: 0  Physical exam:  Vitals:   06/17/20 1117  BP: 114/74  Pulse: 73  Resp: 16  Temp: 98.1 F (36.7 C)  TempSrc: Oral  Weight: 187 lb (84.8 kg)   Physical Exam Cardiovascular:     Rate and Rhythm: Normal rate and regular rhythm.  Pulmonary:     Effort: Pulmonary effort is normal.  Skin:    General: Skin is warm and dry.  Neurological:     Mental Status: He is alert and oriented to person, place, and time.      CMP Latest Ref Rng & Units 06/17/2020  Glucose 70 - 99 mg/dL 89  BUN 6 - 20 mg/dL 17  Creatinine 0.61 - 1.24 mg/dL 1.01  Sodium 135 - 145 mmol/L 138  Potassium 3.5 - 5.1 mmol/L 4.3  Chloride 98 - 111 mmol/L 102  CO2 22 - 32 mmol/L 25  Calcium 8.9 - 10.3 mg/dL 10.1  Total Protein 6.5 - 8.1 g/dL 7.7  Total Bilirubin 0.3 - 1.2 mg/dL 0.5  Alkaline Phos 38 - 126 U/L 94  AST 15 - 41 U/L 52(H)  ALT 0 - 44 U/L 35   CBC Latest Ref Rng & Units 06/17/2020  WBC 4.0 - 10.5 K/uL 3.8(L)  Hemoglobin 13.0 - 17.0 g/dL 14.2  Hematocrit 39.0 - 52.0 % 41.5  Platelets 150 - 400 K/uL 239    No images are attached to the encounter.  NM PET Image Initial (PI) Skull Base To Thigh  Result Date: 05/26/2020 CLINICAL DATA:  Initial treatment strategy for adenopathy and splenic mass. EXAM: NUCLEAR MEDICINE PET SKULL BASE TO THIGH TECHNIQUE: 9.7 mCi F-18 FDG was injected intravenously. Full-ring PET imaging was performed from the skull base to thigh after the radiotracer. CT data was obtained and used for attenuation correction and anatomic localization. Fasting blood glucose: 104 mg/dl COMPARISON:  None. FINDINGS: Mediastinal blood pool activity: SUV max 2.66 Liver activity: SUV max NA NECK: Bilateral FDG avid cervical lymph  nodes are identified, including: -Index right level 3/4 node measures 1.7 cm and has an SUV max of 21.62, image 44/4. -Left level 3/4 node measures 1.1 cm within SUV max of 4.7, image 43/4. Incidental CT findings: none CHEST: FDG avid supraclavicular, mediastinal, and right hilar lymph nodes are identified including: -right supraclavicular lymph node measuring 2.9 cm with SUV max of 22, image 47/4. -conglomerate nodal mass within the superior mediastinum measures 4.4 x 5.7 cm and has an SUV max of 21.58, image 66/4. Right lower paratracheal lymph node measures 2.9 cm and has an SUV max of 20.12, image 72/4. -subcarinal lymph node measures 3 cm and has an SUV max of 23.47, image 80/4. -right hilar lymph node measures 1.1 cm and has an SUV max  of 12.83, image 79/4. No pleural effusion. No airspace consolidation, atelectasis, or pneumothorax. Incidental CT findings: Mild aortic atherosclerosis. ABDOMEN/PELVIS: No abnormal FDG uptake within the liver, pancreas or adrenal glands. Normal size spleen contains multiple FDG avid low-attenuation lesions. The largest measures 5.9 cm with SUV max of 17.4, image 112/4. FDG avid abdominopelvic lymph nodes are identified, including: -gastrohepatic ligament node measuring 1.4 cm with SUV max of 19.37, image 112/4. -Left periaortic node measures 1.3 cm within SUV max of 24.39, image 146/4. -right pelvic sidewall lymph node measures 3.5 cm with SUV max of 24.88, image 175/4. -Left inguinal lymph node measures 1.3 cm within SUV max of 14.03, image 200/4. Incidental CT findings: No ascites. Aortic atherosclerosis without aneurysm. SKELETON: FDG avid intramuscular lesion within the right biceps femoris muscle measures 3.1 cm and has an SUV max of 28.6, image 214/4. No FDG avid osseous lesions. Incidental CT findings: none IMPRESSION: 1. Examination is positive for extensive FDG avid adenopathy within the neck, chest, abdomen, and pelvis. Additionally, there are multiple FDG avid splenic  lesions. 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 Correlation with tissue sampling advised. 2. Solitary intramuscular FDG avid lesion within the right lower extremity. Also concerning for lymphoma. 3.  Aortic Atherosclerosis (ICD10-I70.0). Electronically Signed   By: Kerby Moors M.D.   On: 05/26/2020 10:33     Assessment and plan- Patient is a 60 y.o. 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  Explained to the patient that he has a high-grade B-cell lymphoma which requires urgent treatment especially given his LDH that is significantly elevated.Based on his labs today he does not have any evidence of tumor lysis syndrome.  FISH studies for double hit lymphoma are presently pending.  If he has no evidence of double hit I would recommend 6 cycles of R-CHOP IV every 3 weeks.  Discussed risks and benefits of chemotherapy including all but not limited to nausea, vomiting, low blood counts, risk of infections and hospitalization.  Risk of cardiotoxicity associated with anthracyclines.  We will obtain baseline hepatitis B testing today.  I will start him on 5 days of prednisone 100 mg daily along with allopurinol 300 mg daily.  I would ideally like him to get a port placed, echocardiogram chemo teach and start chemotherapy next week.  However patient is overwhelmed with all this information and he would like to discuss this with his primary care Dr. Rosario Jacks as well as his sister before making any decisions.  He would also need bone marrow biopsy to complete his staging work-up.  We will reach out to him in a day or 2 if he has made  up his mind about starting treatment.  I have also personally spoken to Dr. Rosario Jacks to try and convince the patient to start treatment.  Lymphoma is highly treatable and it would be prudent for patient to start treatment as soon as possible.   Total face to face encounter time for this patient visit was 45 min.  Total non face to face encounter time for this patient on the day of the visit was 15 min. Time spent in discussing patients case with Dr. Rosario Jacks  Visit Diagnosis 1. High grade B-cell lymphoma ([place])   2. Goals of care, counseling/discussion      Dr. Randa Evens, MD, MPH [facility] [phone] 06/18/2020 11:05 AM

## 2020-06-19 ENCOUNTER — Telehealth: Payer: Self-pay | Admitting: *Deleted

## 2020-06-19 ENCOUNTER — Other Ambulatory Visit: Payer: Self-pay | Admitting: Oncology

## 2020-06-19 ENCOUNTER — Telehealth: Payer: Self-pay | Admitting: Surgery

## 2020-06-19 ENCOUNTER — Other Ambulatory Visit: Payer: Self-pay | Admitting: *Deleted

## 2020-06-19 DIAGNOSIS — C8510 Unspecified B-cell lymphoma, unspecified site: Secondary | ICD-10-CM

## 2020-06-19 NOTE — Telephone Encounter (Signed)
Left message for pt to let him know that we have all the appts. That he will need to do. At first it is a lot to get everything to be set up. After all the back to back appts. When you start chemo It usually gets better. Left my cell and work number for him to call me back.

## 2020-06-19 NOTE — Telephone Encounter (Signed)
Pt has been advised of Pre-Admission date/time, COVID Testing date and Surgery date.  Surgery Date: 06/24/20 Preadmission Testing Date: 06/20/20 (phone 1p-5p) Covid Testing Date: 06/20/20 @ 8:15 am - patient advised to go to the [facility] ([place])   Patient has been made aware to call [phone], between 1-3:00pm the day before surgery, to find out what time to arrive for surgery.

## 2020-06-20 ENCOUNTER — Other Ambulatory Visit: Payer: Self-pay

## 2020-06-20 ENCOUNTER — Other Ambulatory Visit
Admission: RE | Admit: 2020-06-20 | Discharge: 2020-06-20 | Disposition: A | Discharge status: Home / Self Care | Payer: Self-pay | Source: Ambulatory Visit | Attending: Surgery | Admitting: Surgery

## 2020-06-20 ENCOUNTER — Other Ambulatory Visit: Payer: Self-pay | Admitting: Oncology

## 2020-06-20 ENCOUNTER — Other Ambulatory Visit: Payer: Self-pay | Admitting: Radiology

## 2020-06-20 DIAGNOSIS — C8510 Unspecified B-cell lymphoma, unspecified site: Secondary | ICD-10-CM

## 2020-06-20 DIAGNOSIS — Z01812 Encounter for preprocedural laboratory examination: Secondary | ICD-10-CM | POA: Insufficient documentation

## 2020-06-20 DIAGNOSIS — Z20822 Contact with and (suspected) exposure to covid-19: Secondary | ICD-10-CM | POA: Insufficient documentation

## 2020-06-20 HISTORY — DX: Dyspnea, unspecified: R06.00

## 2020-06-20 MED ORDER — LORAZEPAM 0.5 MG PO TABS: 0.5000 mg | ORAL_TABLET | Freq: Four times a day (QID) | ORAL | 0 refills | Status: DC | PRN

## 2020-06-20 MED ORDER — LIDOCAINE-PRILOCAINE 2.5-2.5 % EX CREA: TOPICAL_CREAM | CUTANEOUS | 3 refills | Status: DC

## 2020-06-20 MED ORDER — PROCHLORPERAZINE MALEATE 10 MG PO TABS: 10.0000 mg | ORAL_TABLET | Freq: Four times a day (QID) | ORAL | 6 refills | Status: DC | PRN

## 2020-06-20 MED ORDER — ONDANSETRON HCL 8 MG PO TABS: 8.0000 mg | ORAL_TABLET | Freq: Two times a day (BID) | ORAL | 1 refills | Status: DC | PRN

## 2020-06-20 NOTE — Progress Notes (Signed)

## 2020-06-20 NOTE — Patient Instructions (Signed)
Your procedure is scheduled on: 06/24/20 - Tuesday Report to the Registration Desk on the 1st floor of the [facility]. To find out your arrival time, please call [phone] between 1PM - 3PM on: 06/23/20 - Monday  REMEMBER: Instructions that are not followed completely may result in serious medical risk, up to and including death; or upon the discretion of your surgeon and anesthesiologist your surgery may need to be rescheduled.  Do not eat food or drink any liquids after midnight the night before surgery.  No gum chewing, lozengers or hard candies.  TAKE THESE MEDICATIONS THE MORNING OF SURGERY WITH A SIP OF WATER: - allopurinol (ZYLOPRIM) 300 MG tablet - atenolol (TENORMIN) 25 MG tablet  Follow recommendations from Cardiologist, Pulmonologist or PCP regarding stopping Aspirin, Coumadin, Plavix, Eliquis, Pradaxa, or Pletal.  One week prior to surgery: Stop Anti-inflammatories (NSAIDS) such as Advil, Aleve, Ibuprofen, Motrin, Naproxen, Naprosyn and Aspirin based products such as Excedrin, Goodys Powder, BC Powder.  Stop ANY OVER THE COUNTER supplements until after surgery.  No Alcohol for 24 hours before or after surgery.  No Smoking including e-cigarettes for 24 hours prior to surgery.  No chewable tobacco products for at least 6 hours prior to surgery.  No nicotine patches on the day of surgery.  Do not use any "recreational" drugs for at least a week prior to your surgery.  Please be advised that the combination of cocaine and anesthesia may have negative outcomes, up to and including death. If you test positive for cocaine, your surgery will be cancelled.  On the morning of surgery brush your teeth with toothpaste and water, you may rinse your mouth with mouthwash if you wish. Do not swallow any toothpaste or mouthwash.  Do not wear jewelry, make-up, hairpins, clips or nail polish.  Do not wear lotions, powders, or perfumes.   Do not shave body from the neck down 48  hours prior to surgery just in case you cut yourself which could leave a site for infection.  Also, freshly shaved skin may become irritated if using the CHG soap.  Contact lenses, hearing aids and dentures may not be worn into surgery.  Do not bring valuables to the hospital. [facility] is not responsible for any missing/lost belongings or valuables.   Use CHG Soap or wipes as directed on instruction sheet.  Notify your doctor if there is any change in your medical condition (cold, fever, infection).  Wear comfortable clothing (specific to your surgery type) to the hospital.  Plan for stool softeners for home use; pain medications have a tendency to cause constipation. You can also help prevent constipation by eating foods high in fiber such as fruits and vegetables and drinking plenty of fluids as your diet allows.  After surgery, you can help prevent lung complications by doing breathing exercises.  Take deep breaths and cough every 1-2 hours. Your doctor may order a device called an Incentive Spirometer to help you take deep breaths. When coughing or sneezing, hold a pillow firmly against your incision with both hands. This is called "splinting." Doing this helps protect your incision. It also decreases belly discomfort.  If you are being admitted to the hospital overnight, leave your suitcase in the car. After surgery it may be brought to your room.  If you are being discharged the day of surgery, you will not be allowed to drive home. You will need a responsible adult (18 years or older) to drive you home and stay with you  that night.   If you are taking public transportation, you will need to have a responsible adult (18 years or older) with you. Please confirm with your physician that it is acceptable to use public transportation.   Please call the [facility] Dept. at [phone] if you have any questions about these instructions.  Surgery Visitation  Policy:  Patients undergoing a surgery or procedure may have one family member or support person with them as long as that person is not COVID-19 positive or experiencing its symptoms.  That person may remain in the waiting area during the procedure.  Inpatient Visitation:    Visiting hours are 7 a.m. to 8 p.m. Inpatients will be allowed two visitors daily. The visitors may change each day during the patient's stay. No visitors under the age of 11. Any visitor under the age of 40 must be accompanied by an adult. The visitor must pass COVID-19 screenings, use hand sanitizer when entering and exiting the patient's room and wear a mask at all times, including in the patient's room. Patients must also wear a mask when staff or their visitor are in the room. Masking is required regardless of vaccination status.

## 2020-06-21 LAB — SARS CORONAVIRUS 2 (TAT 6-24 HRS): SARS Coronavirus 2: NEGATIVE

## 2020-06-23 ENCOUNTER — Other Ambulatory Visit: Payer: Self-pay | Admitting: Radiology

## 2020-06-23 ENCOUNTER — Encounter: Payer: Self-pay | Admitting: Oncology

## 2020-06-24 ENCOUNTER — Encounter: Payer: Self-pay | Admitting: Surgery

## 2020-06-24 ENCOUNTER — Ambulatory Visit: Payer: Self-pay

## 2020-06-24 ENCOUNTER — Ambulatory Visit: Payer: Self-pay | Admitting: Anesthesiology

## 2020-06-24 ENCOUNTER — Ambulatory Visit
Admission: RE | Admit: 2020-06-24 | Discharge: 2020-06-24 | Disposition: A | Discharge status: Home / Self Care | Payer: Self-pay | Attending: Surgery | Admitting: Surgery

## 2020-06-24 ENCOUNTER — Encounter
Admission: RE | Disposition: A | Discharge status: Home / Self Care | Payer: Self-pay | Source: Home / Self Care | Attending: Surgery

## 2020-06-24 ENCOUNTER — Other Ambulatory Visit: Payer: Self-pay

## 2020-06-24 ENCOUNTER — Other Ambulatory Visit: Payer: Self-pay | Admitting: Student

## 2020-06-24 DIAGNOSIS — F17200 Nicotine dependence, unspecified, uncomplicated: Secondary | ICD-10-CM | POA: Insufficient documentation

## 2020-06-24 DIAGNOSIS — Z809 Family history of malignant neoplasm, unspecified: Secondary | ICD-10-CM | POA: Insufficient documentation

## 2020-06-24 DIAGNOSIS — Z95828 Presence of other vascular implants and grafts: Secondary | ICD-10-CM

## 2020-06-24 DIAGNOSIS — Z7982 Long term (current) use of aspirin: Secondary | ICD-10-CM | POA: Insufficient documentation

## 2020-06-24 DIAGNOSIS — Z8249 Family history of ischemic heart disease and other diseases of the circulatory system: Secondary | ICD-10-CM | POA: Insufficient documentation

## 2020-06-24 DIAGNOSIS — I10 Essential (primary) hypertension: Secondary | ICD-10-CM | POA: Insufficient documentation

## 2020-06-24 DIAGNOSIS — Z79899 Other long term (current) drug therapy: Secondary | ICD-10-CM | POA: Insufficient documentation

## 2020-06-24 DIAGNOSIS — Z8719 Personal history of other diseases of the digestive system: Secondary | ICD-10-CM | POA: Insufficient documentation

## 2020-06-24 DIAGNOSIS — R591 Generalized enlarged lymph nodes: Secondary | ICD-10-CM

## 2020-06-24 DIAGNOSIS — Z452 Encounter for adjustment and management of vascular access device: Secondary | ICD-10-CM

## 2020-06-24 DIAGNOSIS — C9110 Chronic lymphocytic leukemia of B-cell type not having achieved remission: Secondary | ICD-10-CM

## 2020-06-24 DIAGNOSIS — C8511 Unspecified B-cell lymphoma, lymph nodes of head, face, and neck: Secondary | ICD-10-CM | POA: Insufficient documentation

## 2020-06-24 HISTORY — PX: PORTACATH PLACEMENT: SHX2246

## 2020-06-24 IMAGING — DX DG CHEST 1V PORT
1 series · 1 of 1 positions shown · non-contrast
Comparison: [DATE]

CLINICAL DATA: Status post port placement

EXAM:
PORTABLE CHEST 1 VIEW

[chest ap]
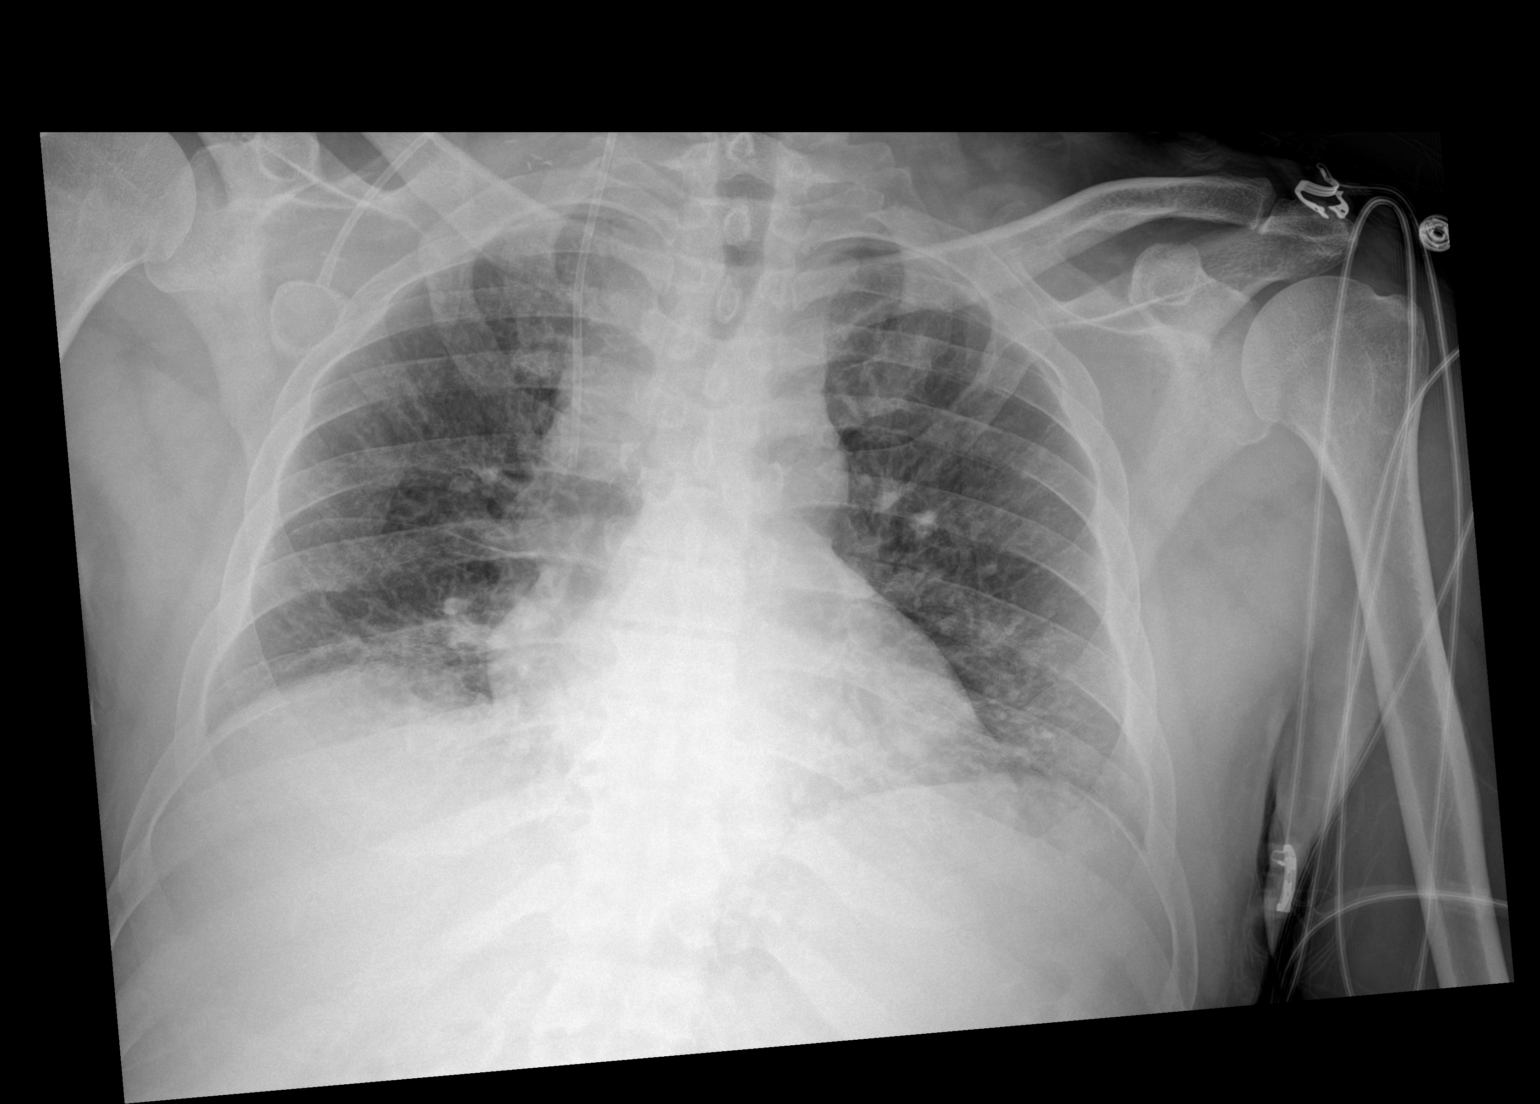

[1 of 1 positions shown; findings below may reference images not displayed]

FINDINGS: Right-sided chest wall port is noted. Catheter tip is seen in the
mid superior vena cava in satisfactory position. No pneumothorax is
noted. Persistent fullness in the right paratracheal space is noted
consistent with the patient's known history. Cardiac shadow is
within normal limits. Mild bibasilar atelectasis is noted related to
a poor inspiratory effort. No bony abnormality is seen.
IMPRESSION: No pneumothorax following port placement.

## 2020-06-24 SURGERY — INSERTION, TUNNELED CENTRAL VENOUS DEVICE, WITH PORT
Anesthesia: General | Site: Chest | Laterality: Right

## 2020-06-24 MED ORDER — MIDAZOLAM HCL 2 MG/2ML IJ SOLN
INTRAMUSCULAR | Status: DC | PRN
  Administered 2020-06-24: 3 mg via INTRAVENOUS
  Administered 2020-06-24: 1 mg via INTRAVENOUS

## 2020-06-24 MED ORDER — FENTANYL CITRATE (PF) 100 MCG/2ML IJ SOLN
INTRAMUSCULAR | Status: AC
  Filled 2020-06-24: qty 2

## 2020-06-24 MED ORDER — BUPIVACAINE-EPINEPHRINE (PF) 0.25% -1:200000 IJ SOLN
INTRAMUSCULAR | Status: DC | PRN
  Administered 2020-06-24: 10 mL

## 2020-06-24 MED ORDER — ACETAMINOPHEN 500 MG PO TABS: 1000.0000 mg | ORAL_TABLET | ORAL | Status: AC

## 2020-06-24 MED ORDER — CHLORHEXIDINE GLUCONATE 0.12 % MT SOLN: 15.0000 mL | Freq: Once | OROMUCOSAL | Status: AC

## 2020-06-24 MED ORDER — SODIUM CHLORIDE 0.9 % IV SOLN
INTRAVENOUS | Status: DC | PRN
  Administered 2020-06-24: 1 mL via INTRAMUSCULAR

## 2020-06-24 MED ORDER — GABAPENTIN 300 MG PO CAPS
300.0000 mg | ORAL_CAPSULE | ORAL | Status: AC
Start: 2020-06-24 — End: 2020-06-24

## 2020-06-24 MED ORDER — CHLORHEXIDINE GLUCONATE CLOTH 2 % EX PADS
6.0000 | MEDICATED_PAD | Freq: Once | CUTANEOUS | Status: AC
  Administered 2020-06-24: 6 via TOPICAL

## 2020-06-24 MED ORDER — MIDAZOLAM HCL 2 MG/2ML IJ SOLN
INTRAMUSCULAR | Status: AC
  Filled 2020-06-24: qty 2

## 2020-06-24 MED ORDER — HEPARIN SODIUM (PORCINE) 5000 UNIT/ML IJ SOLN
INTRAMUSCULAR | Status: AC
  Filled 2020-06-24: qty 1

## 2020-06-24 MED ORDER — ACETAMINOPHEN 500 MG PO TABS
ORAL_TABLET | ORAL | Status: AC
  Administered 2020-06-24: 1000 mg via ORAL
  Filled 2020-06-24: qty 2

## 2020-06-24 MED ORDER — CELECOXIB 200 MG PO CAPS
ORAL_CAPSULE | ORAL | Status: AC
  Administered 2020-06-24: 200 mg via ORAL
  Filled 2020-06-24: qty 1

## 2020-06-24 MED ORDER — PROPOFOL 500 MG/50ML IV EMUL
INTRAVENOUS | Status: AC
  Filled 2020-06-24: qty 50

## 2020-06-24 MED ORDER — LIDOCAINE HCL (PF) 1 % IJ SOLN
INTRAMUSCULAR | Status: DC | PRN
  Administered 2020-06-24: 10 mL

## 2020-06-24 MED ORDER — GABAPENTIN 300 MG PO CAPS
ORAL_CAPSULE | ORAL | Status: AC
  Administered 2020-06-24: 300 mg via ORAL
  Filled 2020-06-24: qty 1

## 2020-06-24 MED ORDER — ONDANSETRON HCL 4 MG/2ML IJ SOLN
INTRAMUSCULAR | Status: DC | PRN
  Administered 2020-06-24: 4 mg via INTRAVENOUS

## 2020-06-24 MED ORDER — OXYCODONE HCL 5 MG/5ML PO SOLN
5.0000 mg | Freq: Once | ORAL | Status: DC | PRN
Start: 2020-06-24 — End: 2020-06-24

## 2020-06-24 MED ORDER — BUPIVACAINE-EPINEPHRINE (PF) 0.25% -1:200000 IJ SOLN
INTRAMUSCULAR | Status: AC
  Filled 2020-06-24: qty 30

## 2020-06-24 MED ORDER — LIDOCAINE HCL (PF) 1 % IJ SOLN
INTRAMUSCULAR | Status: AC
  Filled 2020-06-24: qty 30

## 2020-06-24 MED ORDER — CHLORHEXIDINE GLUCONATE 0.12 % MT SOLN
OROMUCOSAL | Status: AC
  Administered 2020-06-24: 15 mL via OROMUCOSAL
  Filled 2020-06-24: qty 15

## 2020-06-24 MED ORDER — FENTANYL CITRATE (PF) 100 MCG/2ML IJ SOLN: 25.0000 ug | INTRAMUSCULAR | Status: DC | PRN

## 2020-06-24 MED ORDER — CELECOXIB 200 MG PO CAPS
200.0000 mg | ORAL_CAPSULE | ORAL | Status: AC
Start: 2020-06-24 — End: 2020-06-24

## 2020-06-24 MED ORDER — ORAL CARE MOUTH RINSE: 15.0000 mL | Freq: Once | OROMUCOSAL | Status: AC

## 2020-06-24 MED ORDER — ONDANSETRON HCL 4 MG/2ML IJ SOLN: 4.0000 mg | Freq: Once | INTRAMUSCULAR | Status: DC | PRN

## 2020-06-24 MED ORDER — OXYCODONE HCL 5 MG PO TABS: 5.0000 mg | ORAL_TABLET | Freq: Once | ORAL | Status: DC | PRN

## 2020-06-24 MED ORDER — PROPOFOL 500 MG/50ML IV EMUL
INTRAVENOUS | Status: DC | PRN
  Administered 2020-06-24: 150 ug/kg/min via INTRAVENOUS

## 2020-06-24 MED ORDER — CEFAZOLIN SODIUM-DEXTROSE 2-4 GM/100ML-% IV SOLN
2.0000 g | INTRAVENOUS | Status: AC
  Administered 2020-06-24: 2 g via INTRAVENOUS

## 2020-06-24 MED ORDER — LACTATED RINGERS IV SOLN: INTRAVENOUS | Status: DC

## 2020-06-24 MED ORDER — CEFAZOLIN SODIUM-DEXTROSE 2-4 GM/100ML-% IV SOLN
INTRAVENOUS | Status: AC
  Filled 2020-06-24: qty 100

## 2020-06-24 MED ORDER — FENTANYL CITRATE (PF) 100 MCG/2ML IJ SOLN
INTRAMUSCULAR | Status: DC | PRN
  Administered 2020-06-24: 25 ug via INTRAVENOUS
  Administered 2020-06-24: 100 ug via INTRAVENOUS
  Administered 2020-06-24 (×3): 25 ug via INTRAVENOUS

## 2020-06-24 SURGICAL SUPPLY — 38 items
ADH SKN CLS APL DERMABOND .7 (GAUZE/BANDAGES/DRESSINGS) ×1
APL PRP STRL LF DISP 70% ISPRP (MISCELLANEOUS) ×1
BAG DECANTER FOR FLEXI CONT (MISCELLANEOUS) ×4 IMPLANT
BLADE SURG SZ11 CARB STEEL (BLADE) ×2 IMPLANT
BOOT SUTURE AID YELLOW STND (SUTURE) ×2 IMPLANT
CANISTER SUCT 1200ML W/VALVE (MISCELLANEOUS) IMPLANT
CHLORAPREP W/TINT 26 (MISCELLANEOUS) ×2 IMPLANT
COVER LIGHT HANDLE STERIS (MISCELLANEOUS) ×4 IMPLANT
COVER WAND RF STERILE (DRAPES) ×2 IMPLANT
DERMABOND ADVANCED (GAUZE/BANDAGES/DRESSINGS) ×1
DERMABOND ADVANCED .7 DNX12 (GAUZE/BANDAGES/DRESSINGS) ×1 IMPLANT
DRAPE 3/4 80X56 (DRAPES) ×2 IMPLANT
DRAPE C-ARM XRAY 36X54 (DRAPES) ×4 IMPLANT
DRAPE INCISE IOBAN 66X45 STRL (DRAPES) ×2 IMPLANT
ELECT CAUTERY BLADE 6.4 (BLADE) ×2 IMPLANT
ELECT REM PT RETURN 9FT ADLT (ELECTROSURGICAL) ×2
ELECTRODE REM PT RTRN 9FT ADLT (ELECTROSURGICAL) ×1 IMPLANT
GEL ULTRASOUND 20GR AQUASONIC (MISCELLANEOUS) ×2 IMPLANT
GLOVE SURG ENC MOIS LTX SZ7 (GLOVE) ×2 IMPLANT
GOWN STRL REUS W/ TWL LRG LVL3 (GOWN DISPOSABLE) ×2 IMPLANT
GOWN STRL REUS W/TWL LRG LVL3 (GOWN DISPOSABLE) ×4
IV NS 500ML (IV SOLUTION) ×2
IV NS 500ML BAXH (IV SOLUTION) ×1 IMPLANT
KIT PORT POWER 8FR ISP CVUE (Port) ×2 IMPLANT
MANIFOLD NEPTUNE II (INSTRUMENTS) ×2 IMPLANT
NEEDLE HYPO 22GX1.5 SAFETY (NEEDLE) ×2 IMPLANT
PACK PORT-A-CATH (MISCELLANEOUS) ×2 IMPLANT
SPONGE LAP 18X18 RF (DISPOSABLE) ×2 IMPLANT
SUT MNCRL AB 4-0 PS2 18 (SUTURE) ×2 IMPLANT
SUT PROLENE 2-0 (SUTURE) ×2
SUT PROLENE 2-0 RB1 36X2 ARM (SUTURE) ×1
SUT VIC AB 3-0 SH 27 (SUTURE) ×2
SUT VIC AB 3-0 SH 27X BRD (SUTURE) ×1 IMPLANT
SUTURE PROLEN 2-0 RB1 36X2 ARM (SUTURE) ×1 IMPLANT
SYR 10ML LL (SYRINGE) ×2 IMPLANT
SYR 20ML LL LF (SYRINGE) ×2 IMPLANT
SYR 5ML LL (SYRINGE) ×2 IMPLANT
TOWEL OR 17X26 4PK STRL BLUE (TOWEL DISPOSABLE) ×2 IMPLANT

## 2020-06-24 NOTE — Anesthesia Postprocedure Evaluation (Signed)
Anesthesia Post Note  Patient: RANDEN KAUTH  Procedure(s) Performed: INSERTION PORT-A-CATH (Right Chest)  Patient location during evaluation: PACU Anesthesia Type: General Level of consciousness: awake and alert Pain management: pain level controlled Vital Signs Assessment: post-procedure vital signs reviewed and stable Respiratory status: spontaneous breathing, nonlabored ventilation, respiratory function stable and patient connected to nasal cannula oxygen Cardiovascular status: blood pressure returned to baseline and stable Postop Assessment: no apparent nausea or vomiting Anesthetic complications: no   No complications documented.   Last Vitals:  Vitals:   06/24/20 1115 06/24/20 1130  BP: 123/84 129/82  Pulse: 70 64  Resp: 14 16  Temp: (!) 36.2 C (!) 36.1 C  SpO2: 100% 99%    Last Pain:  Vitals:   06/24/20 1130  TempSrc: Temporal  PainSc: 0-No pain                 Arita Miss

## 2020-06-24 NOTE — Interval H&P Note (Signed)
History and Physical Interval Note:  06/24/2020 9:04 AM  Margot Chimes  has presented today for surgery, with the diagnosis of High Grade B-cell Lymphoma.  The various methods of treatment have been discussed with the patient and family. After consideration of risks, benefits and other options for treatment, the patient has consented to  Procedure(s): INSERTION PORT-A-CATH (N/A) as a surgical intervention.  The patient's history has been reviewed, patient examined, no change in status, stable for surgery.  I have reviewed the patient's chart and labs.  Questions were answered to the patient's satisfaction.     Roberts

## 2020-06-24 NOTE — Patient Instructions (Signed)
Rituximab Injection What is this medicine? RITUXIMAB (ri TUX i mab) is a monoclonal antibody. It is used to treat certain types of cancer like non-Hodgkin lymphoma and chronic lymphocytic leukemia. It is also used to treat rheumatoid arthritis, granulomatosis with polyangiitis, microscopic polyangiitis, and pemphigus vulgaris. This medicine may be used for other purposes; ask your health care provider or pharmacist if you have questions. COMMON BRAND NAME(S): RIABNI, Rituxan, RUXIENCE What should I tell my health care provider before I take this medicine? They need to know if you have any of these conditions:  chest pain  heart disease  infection especially a viral infection such as chickenpox, cold sores, hepatitis B, or herpes  immune system problems  irregular heartbeat or rhythm  kidney disease  low blood counts (white cells, platelets, or red cells)  lung disease  recent or upcoming vaccine  an unusual or allergic reaction to rituximab, other medicines, foods, dyes, or preservatives  pregnant or trying to get pregnant  breast-feeding How should I use this medicine? This medicine is injected into a vein. It is given by a health care provider in a hospital or clinic setting. A special MedGuide will be given to you before each treatment. Be sure to read this information carefully each time. Talk to your health care provider about the use of this medicine in children. While this drug may be prescribed for children as young as 2 years for selected conditions, precautions do apply. Overdosage: If you think you have taken too much of this medicine contact a poison control center or emergency room at once. NOTE: This medicine is only for you. Do not share this medicine with others. What if I miss a dose? Keep appointments for follow-up doses. It is important not to miss your dose. Call your health care provider if you are unable to keep an appointment. What may interact with this  medicine? Do not take this medicine with any of the following medicines:  live vaccines This medicine may also interact with the following medicines:  cisplatin This list may not describe all possible interactions. Give your health care provider a list of all the medicines, herbs, non-prescription drugs, or dietary supplements you use. Also tell them if you smoke, drink alcohol, or use illegal drugs. Some items may interact with your medicine. What should I watch for while using this medicine? Your condition will be monitored carefully while you are receiving this medicine. You may need blood work done while you are taking this medicine. This medicine can cause serious infusion reactions. To reduce the risk your health care provider may give you other medicines to take before receiving this one. Be sure to follow the directions from your health care provider. This medicine may increase your risk of getting an infection. Call your health care provider for advice if you get a fever, chills, sore throat, or other symptoms of a cold or flu. Do not treat yourself. Try to avoid being around people who are sick. Call your health care provider if you are around anyone with measles, chickenpox, or if you develop sores or blisters that do not heal properly. Avoid taking medicines that contain aspirin, acetaminophen, ibuprofen, naproxen, or ketoprofen unless instructed by your health care provider. These medicines may hide a fever. This medicine may cause serious skin reactions. They can happen weeks to months after starting the medicine. Contact your health care provider right away if you notice fevers or flu-like symptoms with a rash. The rash may be red   or purple and then turn into blisters or peeling of the skin. Or, you might notice a red rash with swelling of the face, lips or lymph nodes in your neck or under your arms. In some patients, this medicine may cause a serious brain infection that may cause  death. If you have any problems seeing, thinking, speaking, walking, or standing, tell your healthcare professional right away. If you cannot reach your healthcare professional, urgently seek other source of medical care. Do not become pregnant while taking this medicine or for at least 12 months after stopping it. Women should inform their health care provider if they wish to become pregnant or think they might be pregnant. There is potential for serious harm to an unborn child. Talk to your health care provider for more information. Women should use a reliable form of birth control while taking this medicine and for 12 months after stopping it. Do not breast-feed while taking this medicine or for at least 6 months after stopping it. What side effects may I notice from receiving this medicine? Side effects that you should report to your health care provider as soon as possible:  allergic reactions (skin rash, itching or hives; swelling of the face, lips, or tongue)  diarrhea  edema (sudden weight gain; swelling of the ankles, feet, hands or other unusual swelling; trouble breathing)  fast, irregular heartbeat  heart attack (trouble breathing; pain or tightness in the chest, neck, back or arms; unusually weak or tired)  infection (fever, chills, cough, sore throat, pain or trouble passing urine)  kidney injury (trouble passing urine or change in the amount of urine)  liver injury (dark yellow or brown urine; general ill feeling or flu-like symptoms; loss of appetite, right upper belly pain; unusually weak or tired, yellowing of the eyes or skin)  low blood pressure (dizziness; feeling faint or lightheaded, falls; unusually weak or tired)  low red blood cell counts (trouble breathing; feeling faint; lightheaded, falls; unusually weak or tired)  mouth sores  redness, blistering, peeling, or loosening of the skin, including inside the mouth  stomach pain  unusual bruising or  bleeding  wheezing (trouble breathing with loud or whistling sounds)  vomiting Side effects that usually do not require medical attention (report to your health care provider if they continue or are bothersome):  headache  joint pain  muscle cramps, pain  nausea This list may not describe all possible side effects. Call your doctor for medical advice about side effects. You may report side effects to FDA at 1-800-FDA-1088. Where should I keep my medicine? This medicine is given in a hospital or clinic. It will not be stored at home. NOTE: This sheet is a summary. It may not cover all possible information. If you have questions about this medicine, talk to your doctor, pharmacist, or health care provider.  2021 Elsevier/Gold Standard (2019-12-13 21:35:50) Doxorubicin injection What is this medicine? DOXORUBICIN (dox oh ROO bi sin) is a chemotherapy drug. It is used to treat many kinds of cancer like leukemia, lymphoma, neuroblastoma, sarcoma, and Wilms' tumor. It is also used to treat bladder cancer, breast cancer, lung cancer, ovarian cancer, stomach cancer, and thyroid cancer. This medicine may be used for other purposes; ask your health care provider or pharmacist if you have questions. COMMON BRAND NAME(S): Adriamycin, Adriamycin PFS, Adriamycin RDF, Rubex What should I tell my health care provider before I take this medicine? They need to know if you have any of these conditions:  heart disease    history of low blood counts caused by a medicine  liver disease  recent or ongoing radiation therapy  an unusual or allergic reaction to doxorubicin, other chemotherapy agents, other medicines, foods, dyes, or preservatives  pregnant or trying to get pregnant  breast-feeding How should I use this medicine? This drug is given as an infusion into a vein. It is administered in a hospital or clinic by a specially trained health care professional. If you have pain, swelling, burning  or any unusual feeling around the site of your injection, tell your health care professional right away. Talk to your pediatrician regarding the use of this medicine in children. Special care may be needed. Overdosage: If you think you have taken too much of this medicine contact a poison control center or emergency room at once. NOTE: This medicine is only for you. Do not share this medicine with others. What if I miss a dose? It is important not to miss your dose. Call your doctor or health care professional if you are unable to keep an appointment. What may interact with this medicine? This medicine may interact with the following medications:  6-mercaptopurine  paclitaxel  phenytoin  St. John's Wort  trastuzumab  verapamil This list may not describe all possible interactions. Give your health care provider a list of all the medicines, herbs, non-prescription drugs, or dietary supplements you use. Also tell them if you smoke, drink alcohol, or use illegal drugs. Some items may interact with your medicine. What should I watch for while using this medicine? This drug may make you feel generally unwell. This is not uncommon, as chemotherapy can affect healthy cells as well as cancer cells. Report any side effects. Continue your course of treatment even though you feel ill unless your doctor tells you to stop. There is a maximum amount of this medicine you should receive throughout your life. The amount depends on the medical condition being treated and your overall health. Your doctor will watch how much of this medicine you receive in your lifetime. Tell your doctor if you have taken this medicine before. You may need blood work done while you are taking this medicine. Your urine may turn red for a few days after your dose. This is not blood. If your urine is dark or brown, call your doctor. In some cases, you may be given additional medicines to help with side effects. Follow all  directions for their use. Call your doctor or health care professional for advice if you get a fever, chills or sore throat, or other symptoms of a cold or flu. Do not treat yourself. This drug decreases your body's ability to fight infections. Try to avoid being around people who are sick. This medicine may increase your risk to bruise or bleed. Call your doctor or health care professional if you notice any unusual bleeding. Talk to your doctor about your risk of cancer. You may be more at risk for certain types of cancers if you take this medicine. Do not become pregnant while taking this medicine or for 6 months after stopping it. Women should inform their doctor if they wish to become pregnant or think they might be pregnant. Men should not father a child while taking this medicine and for 6 months after stopping it. There is a potential for serious side effects to an unborn child. Talk to your health care professional or pharmacist for more information. Do not breast-feed an infant while taking this medicine. This medicine has caused ovarian   failure in some women and reduced sperm counts in some men This medicine may interfere with the ability to have a child. Talk with your doctor or health care professional if you are concerned about your fertility. This medicine may cause a decrease in Co-Enzyme Q-10. You should make sure that you get enough Co-Enzyme Q-10 while you are taking this medicine. Discuss the foods you eat and the vitamins you take with your health care professional. What side effects may I notice from receiving this medicine? Side effects that you should report to your doctor or health care professional as soon as possible:  allergic reactions like skin rash, itching or hives, swelling of the face, lips, or tongue  breathing problems  chest pain  fast or irregular heartbeat  low blood counts - this medicine may decrease the number of white blood cells, red blood cells and  platelets. You may be at increased risk for infections and bleeding.  pain, redness, or irritation at site where injected  signs of infection - fever or chills, cough, sore throat, pain or difficulty passing urine  signs of decreased platelets or bleeding - bruising, pinpoint red spots on the skin, black, tarry stools, blood in the urine  swelling of the ankles, feet, hands  tiredness  weakness Side effects that usually do not require medical attention (report to your doctor or health care professional if they continue or are bothersome):  diarrhea  hair loss  mouth sores  nail discoloration or damage  nausea  red colored urine  vomiting This list may not describe all possible side effects. Call your doctor for medical advice about side effects. You may report side effects to FDA at 1-800-FDA-1088. Where should I keep my medicine? This drug is given in a hospital or clinic and will not be stored at home. NOTE: This sheet is a summary. It may not cover all possible information. If you have questions about this medicine, talk to your doctor, pharmacist, or health care provider.  2021 Elsevier/Gold Standard (2016-10-13 11:01:26) Vincristine injection What is this medicine? VINCRISTINE (vin KRIS teen) is a chemotherapy drug. It slows the growth of cancer cells. This medicine is used to treat many types of cancer like Hodgkin's disease, leukemia, non-Hodgkin's lymphoma, neuroblastoma (brain cancer), rhabdomyosarcoma, and Wilms' tumor. This medicine may be used for other purposes; ask your health care provider or pharmacist if you have questions. COMMON BRAND NAME(S): Oncovin, Vincasar PFS What should I tell my health care provider before I take this medicine? They need to know if you have any of these conditions:  blood disorders  gout  infection (especially chickenpox, cold sores, or herpes)  kidney disease  liver disease  lung disease  nervous system disease like  Charcot-Marie-Tooth (CMT)  recent or ongoing radiation therapy  an unusual or allergic reaction to vincristine, other chemotherapy agents, other medicines, foods, dyes, or preservatives  pregnant or trying to get pregnant  breast-feeding How should I use this medicine? This drug is given as an infusion into a vein. It is administered in a hospital or clinic by a specially trained health care professional. If you have pain, swelling, burning, or any unusual feeling around the site of your injection, tell your health care professional right away. Talk to your pediatrician regarding the use of this medicine in children. While this drug may be prescribed for selected conditions, precautions do apply. Overdosage: If you think you have taken too much of this medicine contact a poison control center or emergency   room at once. NOTE: This medicine is only for you. Do not share this medicine with others. What if I miss a dose? It is important not to miss your dose. Call your doctor or health care professional if you are unable to keep an appointment. What may interact with this medicine?  certain medicines for fungal infections like itraconazole, ketoconazole, posaconazole, voriconazole  certain medicines for seizures like phenytoin This list may not describe all possible interactions. Give your health care provider a list of all the medicines, herbs, non-prescription drugs, or dietary supplements you use. Also tell them if you smoke, drink alcohol, or use illegal drugs. Some items may interact with your medicine. What should I watch for while using this medicine? This drug may make you feel generally unwell. This is not uncommon, as chemotherapy can affect healthy cells as well as cancer cells. Report any side effects. Continue your course of treatment even though you feel ill unless your doctor tells you to stop. You may need blood work done while you are taking this medicine. This medicine will  cause constipation. Try to have a bowel movement at least every 2 to 3 days. If you do not have a bowel movement for 3 days, call your doctor or health care professional. In some cases, you may be given additional medicines to help with side effects. Follow all directions for their use. Do not become pregnant while taking this medicine. Women should inform their doctor if they wish to become pregnant or think they might be pregnant. There is a potential for serious side effects to an unborn child. Talk to your health care professional or pharmacist for more information. Do not breast-feed an infant while taking this medicine. This medicine may make it more difficult to get pregnant or to father a child. Talk to your healthcare professional if you are concerned about your fertility. What side effects may I notice from receiving this medicine? Side effects that you should report to your doctor or health care professional as soon as possible:  allergic reactions like skin rash, itching or hives, swelling of the face, lips, or tongue  breathing problems  confusion or changes in emotions or moods  constipation  cough  mouth sores  muscle weakness  nausea and vomiting  pain, swelling, redness or irritation at the injection site  pain, tingling, numbness in the hands or feet  problems with balance, talking, walking  seizures  stomach pain  trouble passing urine or change in the amount of urine Side effects that usually do not require medical attention (report to your doctor or health care professional if they continue or are bothersome):  diarrhea  hair loss  jaw pain  loss of appetite This list may not describe all possible side effects. Call your doctor for medical advice about side effects. You may report side effects to FDA at 1-800-FDA-1088. Where should I keep my medicine? This drug is given in a hospital or clinic and will not be stored at home. NOTE: This sheet is a  summary. It may not cover all possible information. If you have questions about this medicine, talk to your doctor, pharmacist, or health care provider.  2021 Elsevier/Gold Standard (2019-01-30 17:05:13) Cyclophosphamide Injection What is this medicine? CYCLOPHOSPHAMIDE (sye kloe FOSS fa mide) is a chemotherapy drug. It slows the growth of cancer cells. This medicine is used to treat many types of cancer like lymphoma, myeloma, leukemia, breast cancer, and ovarian cancer, to name a few. This medicine may   be used for other purposes; ask your health care provider or pharmacist if you have questions. COMMON BRAND NAME(S): Cytoxan, Neosar What should I tell my health care provider before I take this medicine? They need to know if you have any of these conditions:  heart disease  history of irregular heartbeat  infection  kidney disease  liver disease  low blood counts, like white cells, platelets, or red blood cells  on hemodialysis  recent or ongoing radiation therapy  scarring or thickening of the lungs  trouble passing urine  an unusual or allergic reaction to cyclophosphamide, other medicines, foods, dyes, or preservatives  pregnant or trying to get pregnant  breast-feeding How should I use this medicine? This drug is usually given as an injection into a vein or muscle or by infusion into a vein. It is administered in a hospital or clinic by a specially trained health care professional. Talk to your pediatrician regarding the use of this medicine in children. Special care may be needed. Overdosage: If you think you have taken too much of this medicine contact a poison control center or emergency room at once. NOTE: This medicine is only for you. Do not share this medicine with others. What if I miss a dose? It is important not to miss your dose. Call your doctor or health care professional if you are unable to keep an appointment. What may interact with this  medicine?  amphotericin B  azathioprine  certain antivirals for HIV or hepatitis  certain medicines for blood pressure, heart disease, irregular heart beat  certain medicines that treat or prevent blood clots like warfarin  certain other medicines for cancer  cyclosporine  etanercept  indomethacin  medicines that relax muscles for surgery  medicines to increase blood counts  metronidazole This list may not describe all possible interactions. Give your health care provider a list of all the medicines, herbs, non-prescription drugs, or dietary supplements you use. Also tell them if you smoke, drink alcohol, or use illegal drugs. Some items may interact with your medicine. What should I watch for while using this medicine? Your condition will be monitored carefully while you are receiving this medicine. You may need blood work done while you are taking this medicine. Drink water or other fluids as directed. Urinate often, even at night. Some products may contain alcohol. Ask your health care professional if this medicine contains alcohol. Be sure to tell all health care professionals you are taking this medicine. Certain medicines, like metronidazole and disulfiram, can cause an unpleasant reaction when taken with alcohol. The reaction includes flushing, headache, nausea, vomiting, sweating, and increased thirst. The reaction can last from 30 minutes to several hours. Do not become pregnant while taking this medicine or for 1 year after stopping it. Women should inform their health care professional if they wish to become pregnant or think they might be pregnant. Men should not father a child while taking this medicine and for 4 months after stopping it. There is potential for serious side effects to an unborn child. Talk to your health care professional for more information. Do not breast-feed an infant while taking this medicine or for 1 week after stopping it. This medicine has  caused ovarian failure in some women. This medicine may make it more difficult to get pregnant. Talk to your health care professional if you are concerned about your fertility. This medicine has caused decreased sperm counts in some men. This may make it more difficult to father a   child. Talk to your health care professional if you are concerned about your fertility. Call your health care professional for advice if you get a fever, chills, or sore throat, or other symptoms of a cold or flu. Do not treat yourself. This medicine decreases your body's ability to fight infections. Try to avoid being around people who are sick. Avoid taking medicines that contain aspirin, acetaminophen, ibuprofen, naproxen, or ketoprofen unless instructed by your health care professional. These medicines may hide a fever. Talk to your health care professional about your risk of cancer. You may be more at risk for certain types of cancer if you take this medicine. If you are going to need surgery or other procedure, tell your health care professional that you are using this medicine. Be careful brushing or flossing your teeth or using a toothpick because you may get an infection or bleed more easily. If you have any dental work done, tell your dentist you are receiving this medicine. What side effects may I notice from receiving this medicine? Side effects that you should report to your doctor or health care professional as soon as possible:  allergic reactions like skin rash, itching or hives, swelling of the face, lips, or tongue  breathing problems  nausea, vomiting  signs and symptoms of bleeding such as bloody or black, tarry stools; red or dark brown urine; spitting up blood or brown material that looks like coffee grounds; red spots on the skin; unusual bruising or bleeding from the eyes, gums, or nose  signs and symptoms of heart failure like fast, irregular heartbeat, sudden weight gain; swelling of the ankles,  feet, hands  signs and symptoms of infection like fever; chills; cough; sore throat; pain or trouble passing urine  signs and symptoms of kidney injury like trouble passing urine or change in the amount of urine  signs and symptoms of liver injury like dark yellow or brown urine; general ill feeling or flu-like symptoms; light-colored stools; loss of appetite; nausea; right upper belly pain; unusually weak or tired; yellowing of the eyes or skin Side effects that usually do not require medical attention (report to your doctor or health care professional if they continue or are bothersome):  confusion  decreased hearing  diarrhea  facial flushing  hair loss  headache  loss of appetite  missed menstrual periods  signs and symptoms of low red blood cells or anemia such as unusually weak or tired; feeling faint or lightheaded; falls  skin discoloration This list may not describe all possible side effects. Call your doctor for medical advice about side effects. You may report side effects to FDA at 1-800-FDA-1088. Where should I keep my medicine? This drug is given in a hospital or clinic and will not be stored at home. NOTE: This sheet is a summary. It may not cover all possible information. If you have questions about this medicine, talk to your doctor, pharmacist, or health care provider.  2021 Elsevier/Gold Standard (2018-12-04 09:53:29)  

## 2020-06-24 NOTE — Op Note (Signed)
  Pre-operative Diagnosis:lymphoma  Post-operative Diagnosis: same   Surgeon: Caroleen Hamman, MD FACS  Anesthesia: IV sedation, marcaine .25% w epi and lidocaine 1%  Procedure: right IJ  Port placement with fluoroscopy under U/S guidance  Findings: Good position of the tip of the catheter by fluoroscopy  Estimated Blood Loss: Minimal         Drains: None         Specimens: None       Complications: none            Procedure Details  The patient was seen again in the Holding Room. The benefits, complications, treatment options, and expected outcomes were discussed with the patient. The risks of bleeding, infection, recurrence of symptoms, failure to resolve symptoms,  thrombosis nonfunction breakage pneumothorax hemopneumothorax any of which could require chest tube or further surgery were reviewed with the patient.   The patient was taken to Operating Room, identified as Margot Chimes and the procedure verified.  A Time Out was held and the above information confirmed.  Prior to the induction of general anesthesia, antibiotic prophylaxis was administered. VTE prophylaxis was in place. Appropriate anesthesia was then administered and tolerated well. The chest was prepped with Chloraprep and draped in the sterile fashion. The patient was positioned in the supine position. Then the patient was placed in Trendelenburg position.  Patient was prepped and draped in sterile fashion and in a Trendelenburg position local anesthetic was infiltrated into the skin and subcutaneous tissues in the neck and anterior chest wall. The large bore needle was placed into the internal jugular vein under U/S guidance without difficulty and then the Seldinger wire was advanced. Fluoroscopy was utilized to confirm that the Seldinger wire was in the superior vena cava.  An incision was made and a port pocket developed with blunt and electrocautery dissection. The introducer dilator was placed over the Seldinger  wire the wire was removed. The previously flushed catheter was placed into the introducer dilator and the peel-away sheath was removed. The catheter length was confirmed and trimmed utilizing fluoroscopy for proper positioning. The catheter was then attached to the previously flushed port. The port was placed into the pocket. The port was held in with 2-0 Prolenes and flushed for function and heparin locked.  The wound was closed with interrupted 3-0 Vicryl followed by 4-0 subcuticular Monocryl sutures. Dermabond used to coat the skin  Patient was taken to the recovery room in stable condition where a postoperative chest film has been ordered.

## 2020-06-24 NOTE — Transfer of Care (Signed)
Immediate Anesthesia Transfer of Care Note  Patient: Christopher Mills  Procedure(s) Performed: INSERTION PORT-A-CATH (Right Chest)  Patient Location: PACU  Anesthesia Type:General  Level of Consciousness: sedated  Airway & Oxygen Therapy: Patient Spontanous Breathing and Patient connected to face mask oxygen  Post-op Assessment: Report given to RN and Post -op Vital signs reviewed and stable  Post vital signs: Reviewed and stable  Last Vitals:  Vitals Value Taken Time  BP 125/61 06/24/20 1045  Temp    Pulse 61 06/24/20 1047  Resp 6 06/24/20 1047  SpO2 100 % 06/24/20 1047  Vitals shown include unvalidated device data.  Last Pain:  Vitals:   06/24/20 0743  TempSrc: Temporal  PainSc: 0-No pain         Complications: No complications documented.

## 2020-06-24 NOTE — Anesthesia Preprocedure Evaluation (Signed)
Anesthesia Evaluation  Patient identified by MRN, date of birth, ID band Patient awake    Reviewed: Allergy & Precautions, NPO status , Patient's Chart, lab work & pertinent test results  History of Anesthesia Complications Negative for: history of anesthetic complications  Airway Mallampati: II  TM Distance: >3 FB Neck ROM: Full    Dental no notable dental hx. (+) Edentulous Upper, Poor Dentition   Pulmonary shortness of breath and with exertion, neg sleep apnea, neg COPD, Current Smoker and Patient abstained from smoking.,    Pulmonary exam normal breath sounds clear to auscultation       Cardiovascular Exercise Tolerance: Good METShypertension, Pt. 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)  Reproductive/Obstetrics                             Anesthesia Physical Anesthesia Plan  ASA: III  Anesthesia Plan: General   Post-op Pain Management:    Induction: Intravenous  PONV Risk Score and Plan: 1 and Ondansetron, Propofol infusion, TIVA and Midazolam  Airway Management Planned: Nasal Cannula and Natural Airway  Additional Equipment: None  Intra-op Plan:   Post-operative Plan:   Informed Consent: I have reviewed the patients History and Physical, chart, labs and discussed the procedure including the risks, benefits and alternatives for the proposed anesthesia with the patient or authorized representative who has indicated his/her understanding and  acceptance.     Dental advisory given  Plan Discussed with: CRNA and Surgeon  Anesthesia Plan Comments: (Discussed risks of anesthesia with patient, including possibility of difficulty with spontaneous ventilation under anesthesia necessitating airway intervention, PONV, and rare risks such as cardiac or respiratory or neurological events. Patient understands.)        Anesthesia Quick Evaluation

## 2020-06-24 NOTE — Discharge Instructions (Signed)

## 2020-06-25 ENCOUNTER — Encounter: Payer: Self-pay | Admitting: Surgery

## 2020-06-25 ENCOUNTER — Other Ambulatory Visit: Payer: Self-pay

## 2020-06-25 ENCOUNTER — Ambulatory Visit
Admission: RE | Admit: 2020-06-25 | Discharge: 2020-06-25 | Disposition: A | Discharge status: Home / Self Care | Payer: Self-pay | Source: Ambulatory Visit | Attending: Oncology | Admitting: Oncology

## 2020-06-25 DIAGNOSIS — Z87891 Personal history of nicotine dependence: Secondary | ICD-10-CM | POA: Insufficient documentation

## 2020-06-25 DIAGNOSIS — Z79899 Other long term (current) drug therapy: Secondary | ICD-10-CM | POA: Insufficient documentation

## 2020-06-25 DIAGNOSIS — Z7982 Long term (current) use of aspirin: Secondary | ICD-10-CM | POA: Insufficient documentation

## 2020-06-25 DIAGNOSIS — C8591 Non-Hodgkin lymphoma, unspecified, lymph nodes of head, face, and neck: Secondary | ICD-10-CM | POA: Insufficient documentation

## 2020-06-25 DIAGNOSIS — I10 Essential (primary) hypertension: Secondary | ICD-10-CM | POA: Insufficient documentation

## 2020-06-25 LAB — CBC WITH DIFFERENTIAL/PLATELET
Abs Immature Granulocytes: 0.01 10*3/uL (ref 0.00–0.07)
Basophils Absolute: 0 10*3/uL (ref 0.0–0.1)
Basophils Relative: 0 %
Eosinophils Absolute: 0.1 10*3/uL (ref 0.0–0.5)
Eosinophils Relative: 2 %
HCT: 41.2 % (ref 39.0–52.0)
Hemoglobin: 14.2 g/dL (ref 13.0–17.0)
Immature Granulocytes: 0 %
Lymphocytes Relative: 38 %
Lymphs Abs: 1.8 10*3/uL (ref 0.7–4.0)
MCH: 29.3 pg (ref 26.0–34.0)
MCHC: 34.5 g/dL (ref 30.0–36.0)
MCV: 85.1 fL (ref 80.0–100.0)
Monocytes Absolute: 0.4 10*3/uL (ref 0.1–1.0)
Monocytes Relative: 7 %
Neutro Abs: 2.4 10*3/uL (ref 1.7–7.7)
Neutrophils Relative %: 53 %
Platelets: 241 10*3/uL (ref 150–400)
RBC: 4.84 MIL/uL (ref 4.22–5.81)
RDW: 13.3 % (ref 11.5–15.5)
WBC: 4.7 10*3/uL (ref 4.0–10.5)
nRBC: 0 % (ref 0.0–0.2)

## 2020-06-25 IMAGING — CT CT BIOPSY AND ASPIRATION BONE MARROW
1 of 2 series · 10 of 14 positions shown, 13 images · non-contrast
Comparison: none

INDICATION: New diagnosis of lymphoma. Please perform CT-guided bone marrow
biopsy for tissue diagnostic purposes.

[Series 2: i-spiral 5.0 b30f · axial · 0.76mm/px · z∈[-267,-169]mm · 10 of 36 slices shown, 13 images]
[im 4/36  soft-tissue]
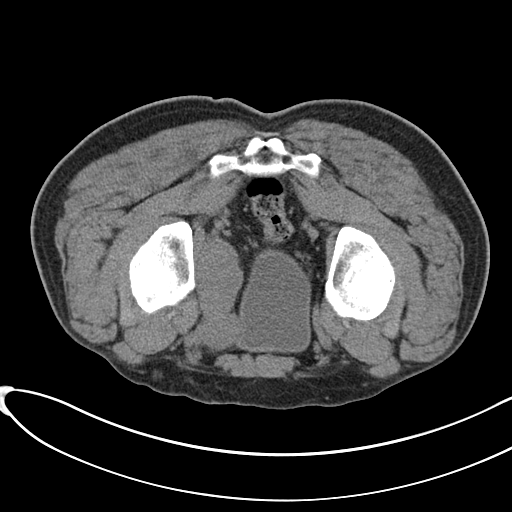
[im 4/36  bone]
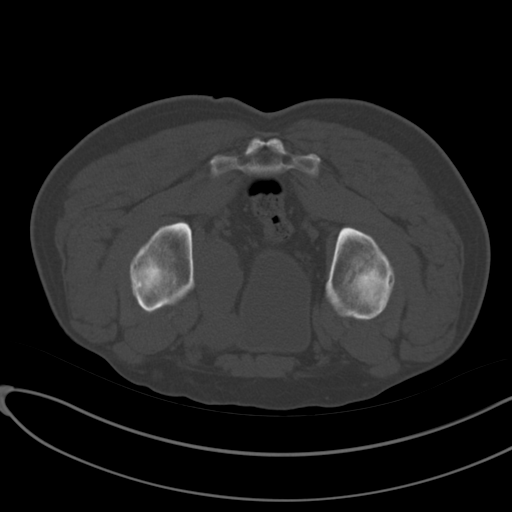
[im 7/36  bone]
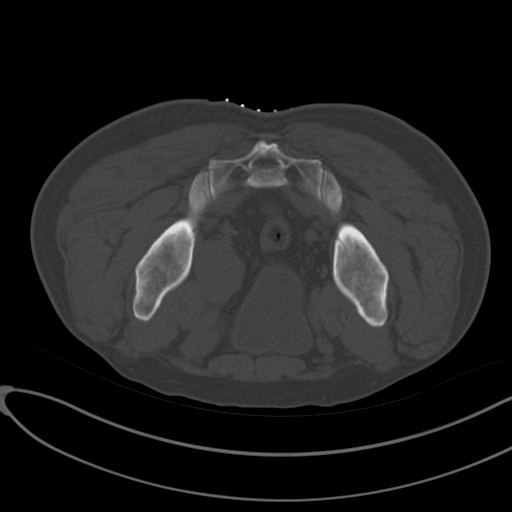
[im 10/36  bone]
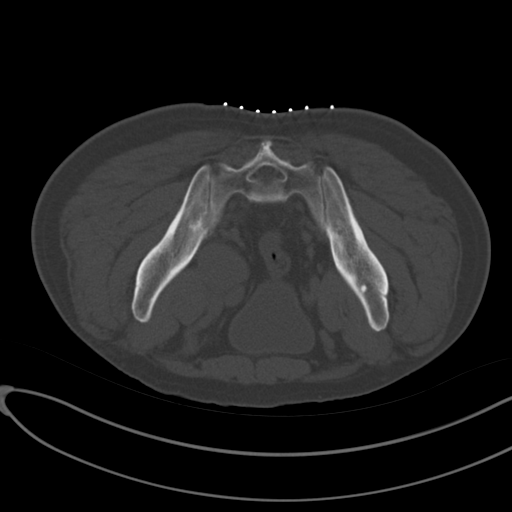
[im 13/36  bone]
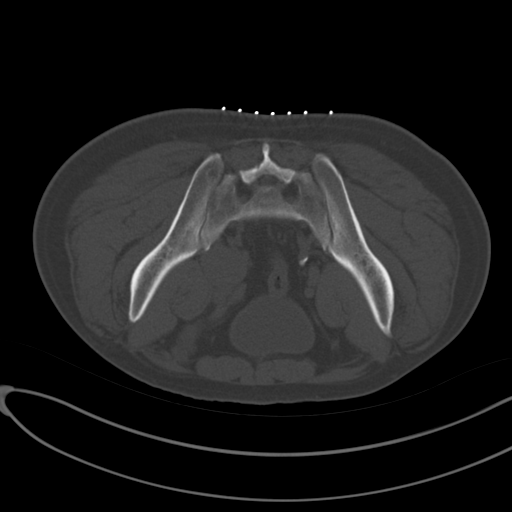
[im 16/36  soft-tissue]
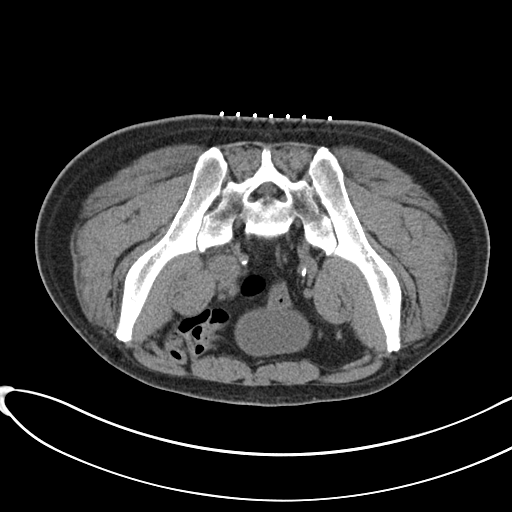
[im 16/36  bone]
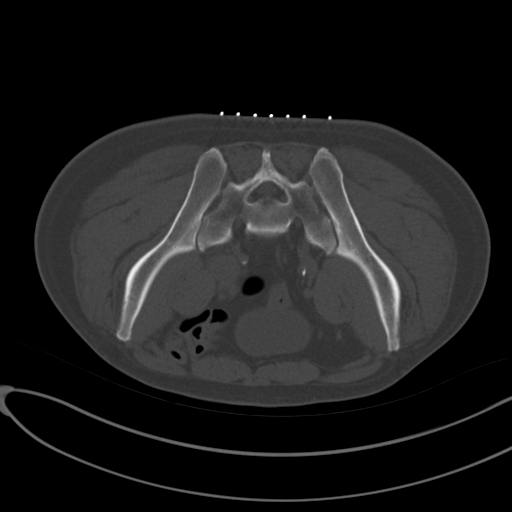
[im 20/36  bone]
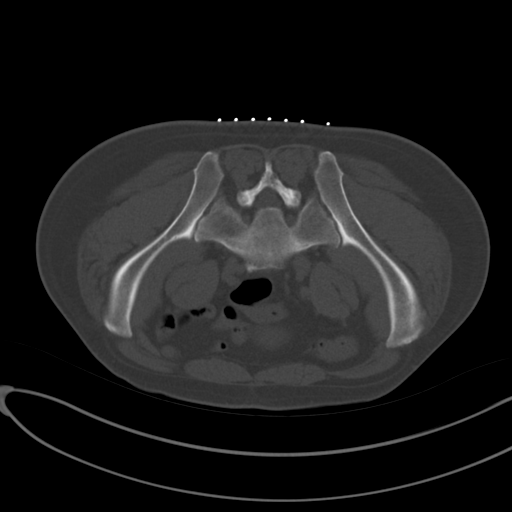
[im 23/36  bone]
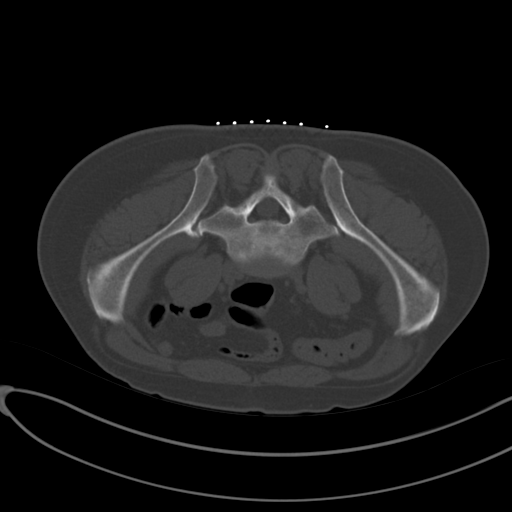
[im 26/36  bone]
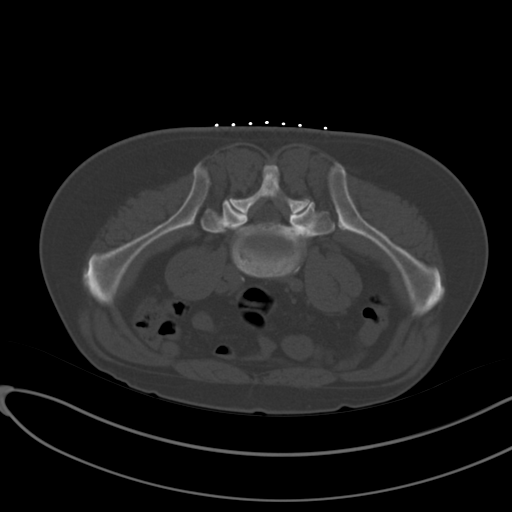
[im 29/36  soft-tissue]
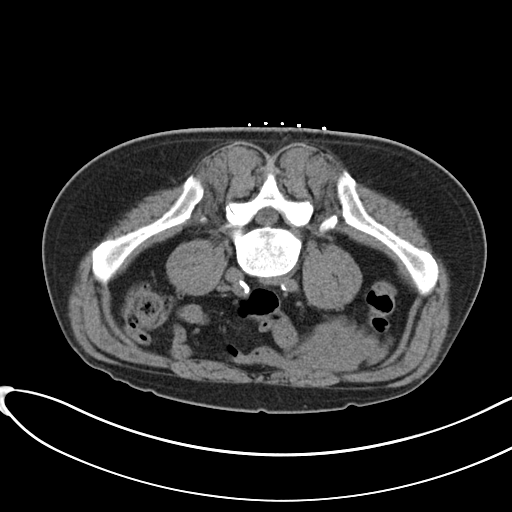
[im 29/36  bone]
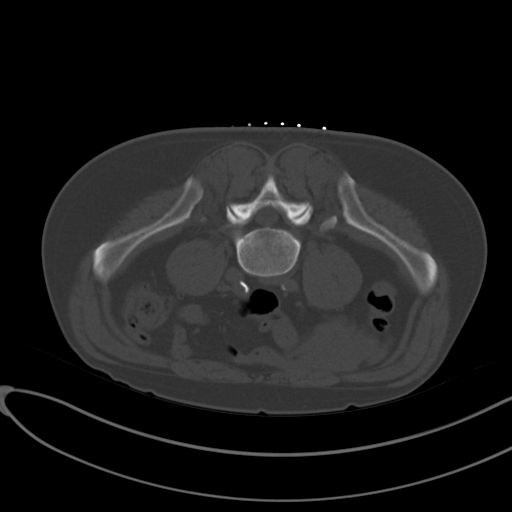
[im 32/36  bone]
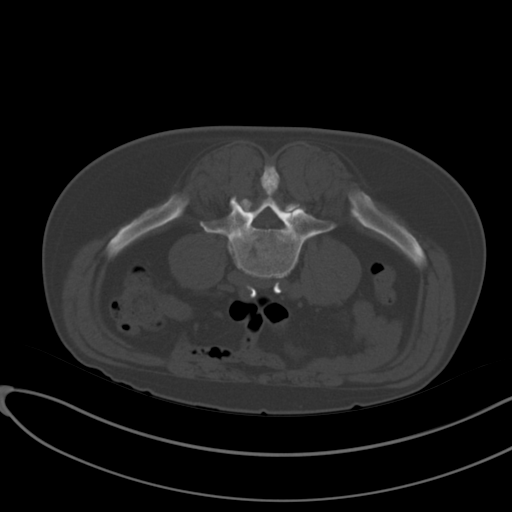

[10 of 14 positions shown; findings below may reference images not displayed]

EXAM:
CT-GUIDED BONE MARROW BIOPSY AND ASPIRATION

MEDICATIONS:
None

ANESTHESIA/SEDATION:
Fentanyl 50 mcg IV; Versed 1 mg IV

Sedation Time: 10 Minutes; The patient was continuously monitored
during the procedure by the interventional radiology nurse under my
direct supervision.

COMPLICATIONS:
None immediate.

PROCEDURE:
Informed consent was obtained from the patient following an
explanation of the procedure, risks, benefits and alternatives. The
patient understands, agrees and consents for the procedure. All
questions were addressed. A time out was performed prior to the
initiation of the procedure.

The patient was positioned prone and non-contrast localization CT
was performed of the pelvis to demonstrate the iliac marrow spaces.
The operative site was prepped and draped in the usual sterile
fashion.

Under sterile conditions and local anesthesia, a 22 gauge spinal
needle was utilized for procedural planning. Next, an 11 gauge
coaxial bone biopsy needle was advanced into the left iliac marrow
space. Needle position was confirmed with CT imaging. Initially, a
bone marrow aspiration was performed. Next, a bone marrow biopsy was
obtained with the 11 gauge outer bone marrow device. Samples were
prepared with the cytotechnologist and deemed adequate. The needle
was removed and superficial hemostasis was obtained with manual
compression. A dressing was applied. The patient tolerated the
procedure well without immediate post procedural complication.
IMPRESSION: Successful CT guided left iliac bone marrow aspiration and core
biopsy.

## 2020-06-25 MED ORDER — FENTANYL CITRATE (PF) 100 MCG/2ML IJ SOLN
INTRAMUSCULAR | Status: AC
  Filled 2020-06-25: qty 2

## 2020-06-25 MED ORDER — FENTANYL CITRATE (PF) 100 MCG/2ML IJ SOLN
INTRAMUSCULAR | Status: AC | PRN
  Administered 2020-06-25: 50 ug via INTRAVENOUS

## 2020-06-25 MED ORDER — MIDAZOLAM HCL 2 MG/2ML IJ SOLN
INTRAMUSCULAR | Status: AC | PRN
  Administered 2020-06-25: 1 mg via INTRAVENOUS

## 2020-06-25 MED ORDER — MIDAZOLAM HCL 2 MG/2ML IJ SOLN
INTRAMUSCULAR | Status: AC
  Filled 2020-06-25: qty 2

## 2020-06-25 MED ORDER — HEPARIN SOD (PORK) LOCK FLUSH 100 UNIT/ML IV SOLN
INTRAVENOUS | Status: AC
  Filled 2020-06-25: qty 5

## 2020-06-25 MED ORDER — SODIUM CHLORIDE 0.9 % IV SOLN: INTRAVENOUS | Status: DC

## 2020-06-25 NOTE — Consult Note (Signed)
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 male with past medical history significant for hypertension with new diagnosis of lymphoma who presents today for CT-guided bone marrow biopsy for tissue diagnostic purposes.  Patient underwent excisional neck mass biopsy on 06/12/2020 as well as portacatheter placement on 06/24/2020.  He is otherwise without complaint.  Specifically, no chest pain, cough, shortness of breath, fever or chills.  No change in appetite or energy level.    Past Medical History:  Diagnosis Date  . Dyspnea   . Hypertension   . Lymphoma of lymph nodes of neck ([place]) 06/18/2020    Past Surgical History:  Procedure Laterality Date  . COLONOSCOPY    . EXCISION MASS NECK Right 06/12/2020   Procedure: EXCISION MASS NECK;  Surgeon: Jules Husbands, MD;  Location: [facility] ORS;  Service: General;  Laterality: Right;  . HERNIA REPAIR Right    at age 66-[facility]H    Allergies: Patient has no known allergies.  Medications: Prior to Admission medications   Medication Sig Start Date End Date Taking? Authorizing Provider  atenolol (TENORMIN) 25 MG tablet Take 25 mg by mouth daily. 04/12/20  Yes [provider]  HYDROcodone-acetaminophen (NORCO/VICODIN) 5-325 MG tablet Take 1 tablet by mouth every 6 (six) hours as needed for moderate pain. 06/16/20  Yes Pabon, Diego F, MD  lisinopril (ZESTRIL) 10 MG tablet Take 10 mg by mouth daily. 04/12/20  Yes [provider]  allopurinol (ZYLOPRIM) 300 MG tablet Take 1 tablet (300 mg total) by mouth daily. Patient not taking: No sig reported 06/18/20   Sindy Guadeloupe, MD  aspirin EC 81 MG tablet Take 81 mg by mouth daily as needed (heart health). Swallow whole. Patient not taking: No sig reported    [provider]   lidocaine-prilocaine (EMLA) cream Apply to affected area once Patient not taking: Reported on 06/24/2020 06/20/20   Sindy Guadeloupe, MD  LORazepam (ATIVAN) 0.5 MG tablet Take 1 tablet (0.5 mg total) by mouth every 6 (six) hours as needed (Nausea or vomiting). 06/20/20   Sindy Guadeloupe, MD  ondansetron (ZOFRAN) 8 MG tablet Take 1 tablet (8 mg total) by mouth 2 (two) times daily as needed for refractory nausea / vomiting. Start on day 3 after cyclophosphamide chemotherapy. Patient not taking: Reported on 06/24/2020 06/20/20   Sindy Guadeloupe, MD  predniSONE (DELTASONE) 5 MG tablet Take 5 mg by mouth in the morning and at bedtime. For 5 days    [provider]  prochlorperazine (COMPAZINE) 10 MG tablet Take 1 tablet (10 mg total) by mouth every 6 (six) hours as needed (Nausea or vomiting). Patient not taking: Reported on 06/24/2020 06/20/20   Sindy Guadeloupe, MD  VITAMIN D PO Take 1 capsule by mouth daily.    [provider]     Family History  Problem Relation Age of Onset  . Anemia Mother   . Hypertension Mother   . Goiter Mother   . Cancer Father   . Cancer Sister   . Multiple sclerosis Sister     Social History   Socioeconomic History  . Marital status: Single    Spouse name: Not on file  . Number of children: Not on file  . Years of education: Not on file  . Highest education level: Not on  file  Occupational History  . Not on file  Tobacco Use  . Smoking status: Former Smoker    Types: Cigarettes    Quit date: 06/13/2020    Years since quitting: 0.0  . Smokeless tobacco: Never Used  Vaping Use  . Vaping Use: Never used  Substance and Sexual Activity  . Alcohol use: Yes    Comment: 1 PINT TO 1 FIFTH OF LIQUOR ON WEEKEND  . Drug use: Never  . Sexual activity: Not Currently  Other Topics Concern  . Not on file  Social History Narrative   Has daughter and grandson in the home   Social Determinants of Health   Financial Resource Strain: Not on file  Food  Insecurity: Not on file  Transportation Needs: Not on file  Physical Activity: Not on file  Stress: Not on file  Social Connections: Not on file    ECOG Status: 1 - Symptomatic but completely ambulatory  Review of Systems: A 12 point ROS discussed and pertinent positives are indicated in the HPI above.  All other systems are negative.  Review of Systems  Vital Signs: BP 118/76   Pulse 62   Ht '6\' 1"'  (1.854 m)   Wt 83.9 kg   SpO2 100%   BMI 24.41 kg/m   Physical Exam Vitals and nursing note reviewed.  Constitutional:      Appearance: Normal appearance.  Cardiovascular:     Rate and Rhythm: Normal rate and regular rhythm.  Pulmonary:     Effort: Pulmonary effort is normal.     Breath sounds: Normal breath sounds.  Neurological:     Mental Status: He is alert.  Psychiatric:        Mood and Affect: Mood normal.        Behavior: Behavior normal.     Imaging: NM PET Image Initial (PI) Skull Base To Thigh  Result Date: 05/26/2020 CLINICAL DATA:  Initial treatment strategy for adenopathy and splenic mass. EXAM: NUCLEAR MEDICINE PET SKULL BASE TO THIGH TECHNIQUE: 9.7 mCi F-18 FDG was injected intravenously. Full-ring PET imaging was performed from the skull base to thigh after the radiotracer. CT data was obtained and used for attenuation correction and anatomic localization. Fasting blood glucose: 104 mg/dl COMPARISON:  None. FINDINGS: Mediastinal blood pool activity: SUV max 2.66 Liver activity: SUV max NA NECK: Bilateral FDG avid cervical lymph nodes are identified, including: -Index right level 3/4 node measures 1.7 cm and has an SUV max of 21.62, image 44/4. -Left level 3/4 node measures 1.1 cm within SUV max of 4.7, image 43/4. Incidental CT findings: none CHEST: FDG avid supraclavicular, mediastinal, and right hilar lymph nodes are identified including: -right supraclavicular lymph node measuring 2.9 cm with SUV max of 22, image 47/4. -conglomerate nodal mass within the  superior mediastinum measures 4.4 x 5.7 cm and has an SUV max of 21.58, image 66/4. Right lower paratracheal lymph node measures 2.9 cm and has an SUV max of 20.12, image 72/4. -subcarinal lymph node measures 3 cm and has an SUV max of 23.47, image 80/4. -right hilar lymph node measures 1.1 cm and has an SUV max of 12.83, image 79/4. No pleural effusion. No airspace consolidation, atelectasis, or pneumothorax. Incidental CT findings: Mild aortic atherosclerosis. ABDOMEN/PELVIS: No abnormal FDG uptake within the liver, pancreas or adrenal glands. Normal size spleen contains multiple FDG avid low-attenuation lesions. The largest measures 5.9 cm with SUV max of 17.4, image 112/4. FDG avid abdominopelvic lymph nodes are identified, including: -gastrohepatic ligament  node measuring 1.4 cm with SUV max of 19.37, image 112/4. -Left periaortic node measures 1.3 cm within SUV max of 24.39, image 146/4. -right pelvic sidewall lymph node measures 3.5 cm with SUV max of 24.88, image 175/4. -Left inguinal lymph node measures 1.3 cm within SUV max of 14.03, image 200/4. Incidental CT findings: No ascites. Aortic atherosclerosis without aneurysm. SKELETON: FDG avid intramuscular lesion within the right biceps femoris muscle measures 3.1 cm and has an SUV max of 28.6, image 214/4. No FDG avid osseous lesions. Incidental CT findings: none IMPRESSION: 1. Examination is positive for extensive FDG avid adenopathy within the neck, chest, abdomen, and pelvis. Additionally, there are multiple FDG avid splenic lesions. 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 Correlation with tissue sampling advised. 2. Solitary intramuscular FDG avid lesion within the right lower extremity. Also concerning for lymphoma. 3.  Aortic Atherosclerosis (ICD10-I70.0). Electronically Signed   By: Kerby Moors M.D.   On: 05/26/2020 10:33   DG CHEST PORT 1 VIEW  Result Date: 06/24/2020 CLINICAL DATA:  Status post port placement EXAM:  PORTABLE CHEST 1 VIEW COMPARISON:  10/21/2019 FINDINGS: Right-sided chest wall port is noted. Catheter tip is seen in the mid superior vena cava in satisfactory position. No pneumothorax is noted. Persistent fullness in the right paratracheal space is noted consistent with the patient's known history. Cardiac shadow is within normal limits. Mild bibasilar atelectasis is noted related to a poor inspiratory effort. No bony abnormality is seen. IMPRESSION: No pneumothorax following port placement. Electronically Signed   By: Inez Catalina M.D.   On: 06/24/2020 11:28   DG C-Arm 1-60 Min-No Report  Result Date: 06/24/2020 Fluoroscopy was utilized by the requesting physician.  No radiographic interpretation.    Labs:  CBC: Recent Labs    10/21/19 2043 05/22/20 1231 06/17/20 1159  WBC 5.8 3.5* 3.8*  HGB 13.6 14.1 14.2  HCT 39.3 41.2 41.5  PLT 246 213 239    COAGS: No results for input(s): INR, APTT in the last 8760 hours.  BMP: Recent Labs    10/21/19 2043 05/22/20 1231 06/17/20 1159  NA 138 138 138  K 3.6 4.2 4.3  CL 104 104 102  CO2 '24 25 25  ' GLUCOSE 119* 86 89  BUN '17 13 17  ' CALCIUM 9.2 10.1 10.1  CREATININE 1.43* 0.94 1.01  GFRNONAA 53* >60 >60  GFRAA >60  --   --     LIVER FUNCTION TESTS: Recent Labs    05/22/20 1231 06/17/20 1159  BILITOT 0.8 0.5  AST 44* 52*  ALT 29 35  ALKPHOS 97 94  PROT 7.6 7.7  ALBUMIN 4.5 4.2    TUMOR MARKERS: No results for input(s): AFPTM, CEA, CA199, CHROMGRNA in the last 8760 hours.  Assessment and Plan:  Christopher Mills is a 60 y.o. male with past medical history significant for hypertension with new diagnosis of lymphoma who presents today for CT-guided bone marrow biopsy for tissue diagnostic purposes.  Patient underwent excisional neck mass biopsy on 06/12/2020 as well as portacatheter placement on 06/24/2020.  He is otherwise without complaint.    Risks and benefits of CT guided BM Bx was discussed with the patient and/or  patient's family including, but not limited to bleeding, infection, damage to adjacent structures or low yield requiring additional tests.  All of the questions were answered and there is agreement to proceed.  Consent signed and in chart.  Thank you for this interesting consult.  I greatly enjoyed meeting Christopher Rinks B  Mills and look forward to participating in their care.  A copy of this report was sent to the requesting provider on this date.  Electronically Signed: Sandi Mariscal, MD 06/25/2020, 8:43 AM   I spent a total of 15 Minutes in face to face in clinical consultation, greater than 50% of which was counseling/coordinating care for CT guided BM Bx.

## 2020-06-25 NOTE — Procedures (Signed)
Pre-procedure Diagnosis: Lymphoma Post-procedure Diagnosis: Same  Technically successful CT guided bone marrow aspiration and biopsy of left iliac crest.   Complications: None Immediate  EBL: None  Signed: Sandi Mariscal Pager: [phone] 06/25/2020, 10:17 AM

## 2020-06-26 ENCOUNTER — Inpatient Hospital Stay: Payer: Self-pay

## 2020-06-26 ENCOUNTER — Other Ambulatory Visit: Payer: Self-pay

## 2020-06-26 NOTE — Progress Notes (Signed)
Tumor Board Documentation  Christopher Mills was presented by Dr Janese Banks at our Tumor Board on 06/26/2020, which included representatives from medical oncology,radiation oncology,internal medicine,navigation,pathology,radiology,surgical,pharmacy,genetics,research,palliative care,pulmonology.  Christopher Mills currently presents as a new patient,for MDC,for new positive pathology with history of the following treatments: surgical intervention(s).  Additionally, we reviewed previous medical and familial history, history of present illness, and recent lab results along with all available histopathologic and imaging studies. The tumor board considered available treatment options and made the following recommendations: Chemotherapy,Immunotherapy ([facility] vs [facility])    The following procedures/referrals were also placed: No orders of the defined types were placed in this encounter.   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  Tumor board is a meeting of clinicians from various specialty areas who evaluate and discuss patients for whom a multidisciplinary approach is being considered. Final determinations in the plan of care are those of the provider(s). The responsibility for follow up of recommendations given during tumor board is that of the provider.   Today's extended care, comprehensive team conference, Jyair was not present for the discussion and was not examined.   Multidisciplinary Tumor Board is a multidisciplinary case peer review process.  Decisions discussed in the Multidisciplinary Tumor Board reflect the opinions of the specialists present at the conference without having examined the patient.  Ultimately, treatment and diagnostic decisions rest with the primary provider(s) and the patient.

## 2020-06-27 LAB — SURGICAL PATHOLOGY

## 2020-06-30 ENCOUNTER — Encounter: Payer: Self-pay | Admitting: Oncology

## 2020-06-30 ENCOUNTER — Other Ambulatory Visit: Payer: Self-pay

## 2020-06-30 ENCOUNTER — Other Ambulatory Visit: Payer: Self-pay | Admitting: Oncology

## 2020-06-30 LAB — SURGICAL PATHOLOGY

## 2020-07-01 ENCOUNTER — Ambulatory Visit
Admission: RE | Admit: 2020-07-01 | Discharge: 2020-07-01 | Disposition: A | Discharge status: Home / Self Care | Payer: Self-pay | Source: Ambulatory Visit | Attending: Oncology | Admitting: Oncology

## 2020-07-01 ENCOUNTER — Encounter: Payer: Self-pay | Admitting: Oncology

## 2020-07-01 ENCOUNTER — Inpatient Hospital Stay (HOSPITAL_BASED_OUTPATIENT_CLINIC_OR_DEPARTMENT_OTHER): Payer: Self-pay | Admitting: Oncology

## 2020-07-01 ENCOUNTER — Telehealth: Payer: Self-pay | Admitting: *Deleted

## 2020-07-01 ENCOUNTER — Other Ambulatory Visit: Payer: Self-pay

## 2020-07-01 ENCOUNTER — Other Ambulatory Visit: Payer: Self-pay | Admitting: *Deleted

## 2020-07-01 VITALS — BP 115/80 | HR 63 | Temp 96.3°F | Resp 20 | Wt 193.3 lb

## 2020-07-01 DIAGNOSIS — C8510 Unspecified B-cell lymphoma, unspecified site: Secondary | ICD-10-CM | POA: Insufficient documentation

## 2020-07-01 IMAGING — NM NM CARDIA MUGA REST
9 series · 41 of 41 positions shown · non-contrast
Comparison: None

CLINICAL DATA: High-grade B-cell lymphoma, pre cardiotoxic
chemotherapy

EXAM:
NUCLEAR MEDICINE CARDIAC BLOOD POOL IMAGING (MUGA)
TECHNIQUE: Cardiac multi-gated acquisition was performed at rest following
intravenous injection of [TR] labeled red blood cells.
RADIOPHARMACEUTICALS:  21.059 mCi [TR] pertechnetate in-vitro
labeled red blood cells IV

[Series 1000: 45 lao · 3.30mm/px · 1 of 1 slices shown]
[im 1/1]
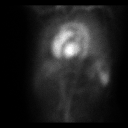

[Series 1000: 45 lao-gated (results) · 3.30mm/px · 6 of 24 frames shown]
[frame 3/24]
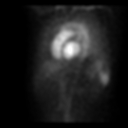
[frame 7/24]
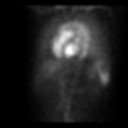
[frame 11/24]
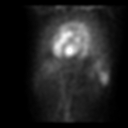
[frame 15/24]
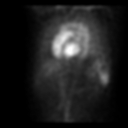
[frame 19/24]
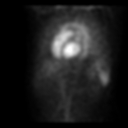
[frame 23/24]
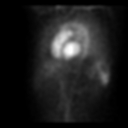

[Series 1000: anterior · 3.30mm/px · 1 of 1 slices shown]
[im 1/1]
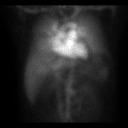

[Series 1000: 45 lao-gated · 3.30mm/px · 6 of 24 frames shown]
[frame 3/24]
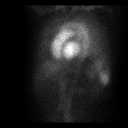
[frame 7/24]
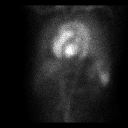
[frame 11/24]
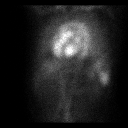
[frame 15/24]
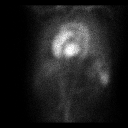
[frame 19/24]
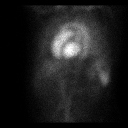
[frame 23/24]
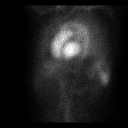

[Series 1000: 70 degree-gated · 3.30mm/px · 6 of 24 frames shown]
[frame 3/24]
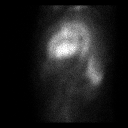
[frame 7/24]
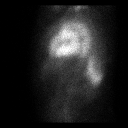
[frame 11/24]
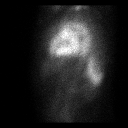
[frame 15/24]
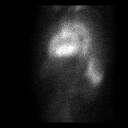
[frame 19/24]
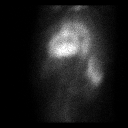
[frame 23/24]
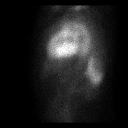

[Series 1000: 45 lao-gated (original with roi) · 3.30mm/px · 6 of 24 frames shown]
[frame 3/24]
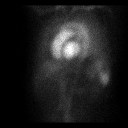
[frame 7/24]
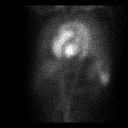
[frame 11/24]
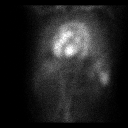
[frame 15/24]
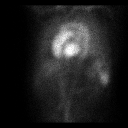
[frame 19/24]
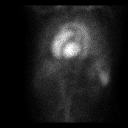
[frame 23/24]
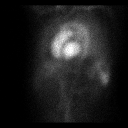

[Series 1000: anterior-gated · 3.30mm/px · 6 of 24 frames shown]
[frame 3/24]
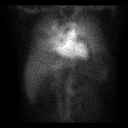
[frame 7/24]
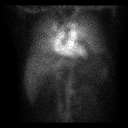
[frame 11/24]
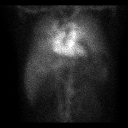
[frame 15/24]
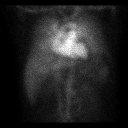
[frame 19/24]
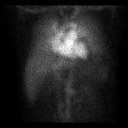
[frame 23/24]
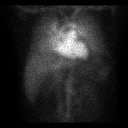

[Series 1000: 70 degree · 3.30mm/px · 1 of 1 slices shown]
[im 1/1]
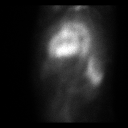

[Series 1000: 45 lao-gated (functional) · 3.30mm/px · 8 of 8 slices shown]
[im 1/8]
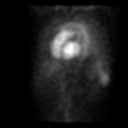
[im 2/8]
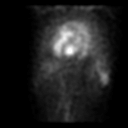
[im 3/8]
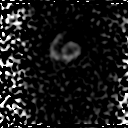
[im 4/8  full-range]
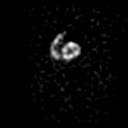
[im 5/8  full-range]
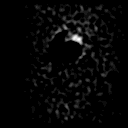
[im 6/8  full-range]
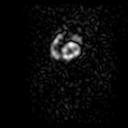
[im 7/8]
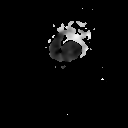
[im 8/8]
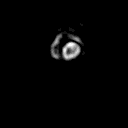

[41 of 41 positions shown; findings below may reference images not displayed]

FINDINGS: Calculated LEFT ventricular ejection fraction is 54.4%, normal.

Study was obtained at a cardiac rate of 56 bpm.

Patient was rhythmic during imaging.

Cine analysis of the LEFT ventricle in 3 projections demonstrates
normal LV wall motion.
IMPRESSION: Normal LEFT ventricular ejection fraction of 54.4%.

Normal LEFT ventricular wall motion.

## 2020-07-01 MED ORDER — ALLOPURINOL 300 MG PO TABS: 300.0000 mg | ORAL_TABLET | Freq: Every day | ORAL | 3 refills | Status: DC

## 2020-07-01 MED ORDER — TECHNETIUM TC 99M-LABELED RED BLOOD CELLS IV KIT
20.0000 | PACK | Freq: Once | INTRAVENOUS | Status: AC | PRN
  Administered 2020-07-01: 21.059 via INTRAVENOUS

## 2020-07-01 MED ORDER — LIDOCAINE-PRILOCAINE 2.5-2.5 % EX CREA: TOPICAL_CREAM | CUTANEOUS | 3 refills | Status: DC

## 2020-07-01 MED ORDER — PROCHLORPERAZINE MALEATE 10 MG PO TABS: 10.0000 mg | ORAL_TABLET | Freq: Four times a day (QID) | ORAL | 6 refills | Status: DC | PRN

## 2020-07-01 MED ORDER — ONDANSETRON HCL 8 MG PO TABS: 8.0000 mg | ORAL_TABLET | Freq: Two times a day (BID) | ORAL | 1 refills | Status: DC | PRN

## 2020-07-01 MED ORDER — PREDNISONE 50 MG PO TABS: ORAL_TABLET | ORAL | 0 refills | Status: DC

## 2020-07-01 MED ORDER — TENOFOVIR ALAFENAMIDE FUMARATE 25 MG PO TABS: 1.0000 | ORAL_TABLET | Freq: Every day | ORAL | 3 refills | Status: DC

## 2020-07-01 NOTE — Telephone Encounter (Signed)
Called pt to let him know that

## 2020-07-02 ENCOUNTER — Other Ambulatory Visit: Payer: Self-pay | Admitting: *Deleted

## 2020-07-02 DIAGNOSIS — C8510 Unspecified B-cell lymphoma, unspecified site: Secondary | ICD-10-CM

## 2020-07-02 NOTE — Progress Notes (Signed)
..  The following Medication: Neulasta is approved for drug replacement program by [employer]. The enrollment period is from 07/02/2020 to 07/02/2021.  Reason for Assistance: Self Pay. ID: [id number] First DOS:07/03/2020.

## 2020-07-02 NOTE — Progress Notes (Signed)
..  The following Medication: Rituxan is approved for drug replacement program by [place]. The enrollment period is from 07/02/2020 to Indefinitely.  Reason for Assistance: Self Pay. ID: [id number] First DOS:07/03/2020.

## 2020-07-02 NOTE — Progress Notes (Signed)
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  This was followed by a PET CT scan which showed extensive FDG avid adenopathy in the neck chest abdomen and pelvis.  FDG avid splenic lesions.  Solitary intramuscular FDG avid lesion in the right biceps femoris muscle.  Supraclavicular excisional lymph node biopsy showed high-grade B-cell lymphoma germinal center type Ki-67 greater than 95%.  FISH testing was positive for Bcl-2 BCL6 and MYC consistent with triple hit lymphoma  Bone marrow biopsy showed involvement with low-grade B-cell lymphoproliferative disorder with no evidence of High-grade B-cell lymphoma.  Interval history-patient reports doing well overall.  Denies any significant pain or shortness of breath  ECOG PS- 0 Pain scale- 0   Review of systems- Review of Systems  Constitutional: Positive for malaise/fatigue. Negative for chills, fever and weight loss.  HENT: Negative for congestion, ear discharge and nosebleeds.   Eyes: Negative for blurred vision.  Respiratory: Negative for cough, hemoptysis, sputum production, shortness of breath and wheezing.   Cardiovascular: Negative for chest pain, palpitations, orthopnea and claudication.  Gastrointestinal: Negative for abdominal pain, blood in stool, constipation, diarrhea, heartburn, melena, nausea and vomiting.   Genitourinary: Negative for dysuria, flank pain, frequency, hematuria and urgency.  Musculoskeletal: Negative for back pain, joint pain and myalgias.  Skin: Negative for rash.  Neurological: Negative for dizziness, tingling, focal weakness, seizures, weakness and headaches.  Endo/Heme/Allergies: Does not bruise/bleed easily.  Psychiatric/Behavioral: Negative for depression and suicidal ideas. The patient does not have insomnia.       No Known Allergies   Past Medical History:  Diagnosis Date  . Dyspnea   . Hypertension   . 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 COLONOSCOPY    . EXCISION MASS NECK Right 06/12/2020   Procedure: EXCISION MASS NECK;  Surgeon: Jules Husbands, MD;  Location: [facility] ORS;  Service: General;  Laterality: Right;  . HERNIA REPAIR Right    at age 49-RIH  . PORTACATH PLACEMENT Right 06/24/2020   Procedure: INSERTION PORT-A-CATH;  Surgeon: Jules Husbands, MD;  Location: [facility] ORS;  Service: General;  Laterality: Right;    Social History   Socioeconomic History  . Marital status: Single    Spouse name: Not on file  . Number of children: Not on file  . Years of education: Not on file  . Highest education level: Not on file  Occupational History  . Not on file  Tobacco Use  . Smoking status: Former Smoker    Types: Cigarettes    Quit date: 06/13/2020    Years since quitting: 0.0  . Smokeless tobacco: Never Used  Vaping Use  . Vaping Use: Never used  Substance and Sexual Activity  . Alcohol use: Yes    Comment: 1 PINT TO 1 FIFTH OF LIQUOR ON WEEKEND  . Drug use: Never  . Sexual activity: Not Currently  Other Topics Concern  . Not on file  Social History Narrative   Has daughter and grandson in the home   Social Determinants of Health   Financial Resource Strain: Not on file  Food Insecurity: Not on file  Transportation Needs: Not on file  Physical Activity: Not on file  Stress:  Not on file  Social Connections: Not on file  Intimate Partner Violence: Not on file    Family History  Problem Relation Age of Onset  . Anemia Mother   . Hypertension Mother   . Goiter Mother   . Cancer Father   . Cancer Sister   . Multiple sclerosis Sister      Current Outpatient Medications:  .  aspirin EC 81 MG tablet, Take 81 mg by mouth daily as needed (heart health). Swallow whole., Disp: , Rfl:  .  VITAMIN D PO, Take 1 capsule by mouth daily., Disp: , Rfl:  .  allopurinol (ZYLOPRIM) 300 MG tablet, Take 1 tablet (300 mg total) by mouth daily., Disp: 30 tablet, Rfl: 3 .  atenolol (TENORMIN) 25 MG tablet, Take 25 mg by mouth daily. (Patient not taking: Reported on 07/01/2020), Disp: , Rfl:  .  HYDROcodone-acetaminophen (NORCO/VICODIN) 5-325 MG tablet, Take 1 tablet by mouth every 6 (six) hours as needed for moderate pain. (Patient not taking: Reported on 07/01/2020), Disp: 15 tablet, Rfl: 0 .  lidocaine-prilocaine (EMLA) cream, Apply to affected area once, Disp: 30 g, Rfl: 3 .  lisinopril (ZESTRIL) 10 MG tablet, Take 10 mg by mouth daily. (Patient not taking: Reported on 07/01/2020), Disp: , Rfl:  .  ondansetron (ZOFRAN) 8 MG tablet, Take 1 tablet (8 mg total) by mouth 2 (two) times daily as needed for refractory nausea / vomiting. Start on day 3 after cyclophosphamide chemotherapy., Disp: 30 tablet, Rfl: 1 .  predniSONE (DELTASONE) 50 MG tablet, Take 2 tablets each day with food early in am for 5 days while on chemo treatment, Disp: 10 tablet, Rfl: 0 .  prochlorperazine (COMPAZINE) 10 MG tablet, Take 1 tablet (10 mg total) by mouth every 6 (six) hours as needed (Nausea or vomiting)., Disp: 30 tablet, Rfl: 6 .  Tenofovir Alafenamide Fumarate 25 MG TABS, Take 1 tablet (25 mg total) by mouth daily., Disp: 30 tablet, Rfl: 3  Physical exam:  Vitals:   07/01/20 1119  BP: 115/80  Pulse: 63  Resp: 20  Temp: (!) 96.3 F (35.7 C)  TempSrc: Tympanic  SpO2: 100%  Weight: 193 lb 4.8 oz  (87.7 kg)   Physical Exam Constitutional:      General: He is not in acute distress. Cardiovascular:     Rate and Rhythm: Normal rate and regular rhythm.     Heart sounds: Normal heart sounds.  Pulmonary:     Effort: Pulmonary effort is normal.  Skin:    General: Skin is warm and dry.  Neurological:     Mental Status: He is alert and oriented to person, place, and time.      CMP Latest Ref Rng & Units 06/17/2020  Glucose 70 - 99 mg/dL 89  BUN 6 - 20 mg/dL 17  Creatinine 0.61 - 1.24 mg/dL 1.01  Sodium 135 - 145 mmol/L 138  Potassium 3.5 - 5.1 mmol/L 4.3  Chloride 98 - 111 mmol/L 102  CO2 22 - 32 mmol/L 25  Calcium 8.9 - 10.3 mg/dL 10.1  Total Protein 6.5 - 8.1 g/dL 7.7  Total Bilirubin 0.3 - 1.2 mg/dL 0.5  Alkaline Phos 38 -  126 U/L 94  AST 15 - 41 U/L 52(H)  ALT 0 - 44 U/L 35   CBC Latest Ref Rng & Units 06/25/2020  WBC 4.0 - 10.5 K/uL 4.7  Hemoglobin 13.0 - 17.0 g/dL 14.2  Hematocrit 39.0 - 52.0 % 41.2  Platelets 150 - 400 K/uL 241    No images are attached to the encounter.  NM Cardiac Muga Rest  Result Date: 07/01/2020 CLINICAL DATA:  High-grade B-cell lymphoma, pre cardiotoxic chemotherapy EXAM: NUCLEAR MEDICINE CARDIAC BLOOD POOL IMAGING (MUGA) TECHNIQUE: Cardiac multi-gated acquisition was performed at rest following intravenous injection of Tc-16mlabeled red blood cells. RADIOPHARMACEUTICALS:  21.059 mCi Tc-940mertechnetate in-vitro labeled red blood cells IV COMPARISON:  None FINDINGS: Calculated LEFT ventricular ejection fraction is 54.4%, normal. Study was obtained at a cardiac rate of 56 bpm. Patient was rhythmic during imaging. Cine analysis of the LEFT ventricle in 3 projections demonstrates normal LV wall motion. IMPRESSION: Normal LEFT ventricular ejection fraction of 54.4%. Normal LEFT ventricular wall motion. Electronically Signed   By: MaLavonia Dana.D.   On: 07/01/2020 15:16   DG CHEST PORT 1 VIEW  Result Date: 06/24/2020 CLINICAL DATA:  Status post  port placement EXAM: PORTABLE CHEST 1 VIEW COMPARISON:  10/21/2019 FINDINGS: Right-sided chest wall port is noted. Catheter tip is seen in the mid superior vena cava in satisfactory position. No pneumothorax is noted. Persistent fullness in the right paratracheal space is noted consistent with the patient's known history. Cardiac shadow is within normal limits. Mild bibasilar atelectasis is noted related to a poor inspiratory effort. No bony abnormality is seen. IMPRESSION: No pneumothorax following port placement. Electronically Signed   By: MaInez Catalina.D.   On: 06/24/2020 11:28   CT BONE MARROW BIOPSY & ASPIRATION  Result Date: 06/25/2020 INDICATION: New diagnosis of lymphoma. Please perform CT-guided bone marrow biopsy for tissue diagnostic purposes. EXAM: CT-GUIDED BONE MARROW BIOPSY AND ASPIRATION MEDICATIONS: None ANESTHESIA/SEDATION: Fentanyl 50 mcg IV; Versed 1 mg IV Sedation Time: 10 Minutes; The patient was continuously monitored during the procedure by the interventional radiology nurse under my direct supervision. COMPLICATIONS: None immediate. PROCEDURE: Informed consent was obtained from the patient following an explanation of the procedure, risks, benefits and alternatives. The patient understands, agrees and consents for the procedure. All questions were addressed. A time out was performed prior to the initiation of the procedure. The patient was positioned prone and non-contrast localization CT was performed of the pelvis to demonstrate the iliac marrow spaces. The operative site was prepped and draped in the usual sterile fashion. Under sterile conditions and local anesthesia, a 22 gauge spinal needle was utilized for procedural planning. Next, an 11 gauge coaxial bone biopsy needle was advanced into the left iliac marrow space. Needle position was confirmed with CT imaging. Initially, a bone marrow aspiration was performed. Next, a bone marrow biopsy was obtained with the 11 gauge outer  bone marrow device. Samples were prepared with the cytotechnologist and deemed adequate. The needle was removed and superficial hemostasis was obtained with manual compression. A dressing was applied. The patient tolerated the procedure well without immediate post procedural complication. IMPRESSION: Successful CT guided left iliac bone marrow aspiration and core biopsy. Electronically Signed   By: JoSandi Mariscal.D.   On: 06/25/2020 10:30   DG C-Arm 1-60 Min-No Report  Result Date: 06/24/2020 Fluoroscopy was utilized by the requesting physician.  No radiographic interpretation.     Assessment and plan- Patient is a [age].o. male with newly  diagnosed stage IV high-grade B-cell lymphoma triple hitIPI score 2 for elevated LDH and stage IV.  By NCCN IPI score would be 4 based on age and elevated LDH and stage IV which puts him in the high intermediate risk group. CNS IPI score 4  Given that patient has triple hit lymphoma I would recommend dose adjusted R-EPOCH chemotherapy instead of R-CHOP chemotherapy.  He was already planned to receive R-CHOP chemotherapy this week and his FISH results just came back yesterday.  Patient is also hesitant to get admitted to the hospital for cycle 1 of chemotherapy and will therefore proceed with R-CHOP chemotherapy for cycle 1 as an outpatient instead of delaying chemo start any further.  Starting cycle 2 he will receive dose adjusted R-EPOCH chemotherapy.  This will be given IV every 3 weeks for 6 cycles.  Dose adjusted R-EPOCH regimen will be given as an outpatient Monday to Friday.  Given that his CNS IPI score is 4 and underlying triple hit B-cell lymphoma I also recommend CNS prophylaxis with intrathecal methotrexate which will be given after each cycle of chemotherapy starting cycle 1.  We will obtain flow cytometry on his CSF fluid prior to first intrathecal methotrexate administration.  I will also obtain baseline MRI brain with and without contrast  Discussed  results of bone marrow biopsy which showed low level of involvement with low-grade B-cell lymphoma but not high-grade B-cell lymphoma.  Patient is high risk for tumor lysis syndrome and is already on allopurinol.  Baseline uric acid was normal but we will again check it on the day of his Chemotherapy as well and consider giving rasburicase if need be.  We will check G6PD levels prior.  Patient is also hepatitis B core antibody positive and I will therefore refer him to GI for this but get him started on tenofovir prophylaxis 25 mg daily to prevent hepatitis B reactivation with Rituxan  Patient comprehends my plan well  Patient will be getting echocardiogram today. Visit Diagnosis 1. High grade B-cell lymphoma ([place])      Dr. Randa Evens, MD, MPH [facility] at [facility] [phone] 07/02/2020 2:02 PM

## 2020-07-03 ENCOUNTER — Encounter (HOSPITAL_COMMUNITY): Payer: Self-pay | Admitting: Oncology

## 2020-07-03 ENCOUNTER — Inpatient Hospital Stay: Payer: Self-pay

## 2020-07-03 ENCOUNTER — Encounter: Payer: Self-pay | Admitting: Oncology

## 2020-07-03 ENCOUNTER — Ambulatory Visit: Payer: Self-pay

## 2020-07-03 ENCOUNTER — Inpatient Hospital Stay (HOSPITAL_BASED_OUTPATIENT_CLINIC_OR_DEPARTMENT_OTHER): Payer: Self-pay | Admitting: Oncology

## 2020-07-03 VITALS — BP 136/71 | HR 57 | Temp 97.0°F | Resp 16 | Ht 73.0 in | Wt 191.8 lb

## 2020-07-03 VITALS — BP 112/63 | HR 54 | Temp 97.1°F | Resp 18

## 2020-07-03 DIAGNOSIS — Z79899 Other long term (current) drug therapy: Secondary | ICD-10-CM

## 2020-07-03 DIAGNOSIS — Z5181 Encounter for therapeutic drug level monitoring: Secondary | ICD-10-CM

## 2020-07-03 DIAGNOSIS — C8510 Unspecified B-cell lymphoma, unspecified site: Secondary | ICD-10-CM

## 2020-07-03 DIAGNOSIS — Z5111 Encounter for antineoplastic chemotherapy: Secondary | ICD-10-CM

## 2020-07-03 LAB — PHOSPHORUS: Phosphorus: 3.7 mg/dL (ref 2.5–4.6)

## 2020-07-03 LAB — CBC WITH DIFFERENTIAL/PLATELET
Abs Immature Granulocytes: 0.03 10*3/uL (ref 0.00–0.07)
Basophils Absolute: 0 10*3/uL (ref 0.0–0.1)
Basophils Relative: 0 %
Eosinophils Absolute: 0 10*3/uL (ref 0.0–0.5)
Eosinophils Relative: 0 %
HCT: 37.4 % — ABNORMAL LOW (ref 39.0–52.0)
Hemoglobin: 12.9 g/dL — ABNORMAL LOW (ref 13.0–17.0)
Immature Granulocytes: 1 %
Lymphocytes Relative: 47 %
Lymphs Abs: 2.5 10*3/uL (ref 0.7–4.0)
MCH: 29.9 pg (ref 26.0–34.0)
MCHC: 34.5 g/dL (ref 30.0–36.0)
MCV: 86.6 fL (ref 80.0–100.0)
Monocytes Absolute: 0.5 10*3/uL (ref 0.1–1.0)
Monocytes Relative: 10 %
Neutro Abs: 2.3 10*3/uL (ref 1.7–7.7)
Neutrophils Relative %: 42 %
Platelets: 225 10*3/uL (ref 150–400)
RBC: 4.32 MIL/uL (ref 4.22–5.81)
RDW: 13.3 % (ref 11.5–15.5)
WBC: 5.3 10*3/uL (ref 4.0–10.5)
nRBC: 0 % (ref 0.0–0.2)

## 2020-07-03 LAB — COMPREHENSIVE METABOLIC PANEL
ALT: 32 U/L (ref 0–44)
AST: 33 U/L (ref 15–41)
Albumin: 3.8 g/dL (ref 3.5–5.0)
Alkaline Phosphatase: 81 U/L (ref 38–126)
Anion gap: 11 (ref 5–15)
BUN: 18 mg/dL (ref 6–20)
CO2: 25 mmol/L (ref 22–32)
Calcium: 9.2 mg/dL (ref 8.9–10.3)
Chloride: 101 mmol/L (ref 98–111)
Creatinine, Ser: 1.03 mg/dL (ref 0.61–1.24)
GFR, Estimated: >60 mL/min (ref >60)
Glucose, Bld: 147 mg/dL — ABNORMAL HIGH (ref 70–99)
Potassium: 3.2 mmol/L — ABNORMAL LOW (ref 3.5–5.1)
Sodium: 137 mmol/L (ref 135–145)
Total Bilirubin: 0.5 mg/dL (ref 0.3–1.2)
Total Protein: 6.5 g/dL (ref 6.5–8.1)

## 2020-07-03 LAB — URIC ACID: Uric Acid, Serum: 7 mg/dL (ref 3.7–8.6)

## 2020-07-03 LAB — LACTATE DEHYDROGENASE: LDH: 514 U/L — ABNORMAL HIGH (ref 98–192)

## 2020-07-03 MED ORDER — PEGFILGRASTIM 6 MG/0.6ML ~~LOC~~ PSKT
6.0000 mg | PREFILLED_SYRINGE | Freq: Once | SUBCUTANEOUS | Status: AC
  Administered 2020-07-03: 6 mg via SUBCUTANEOUS
  Filled 2020-07-03: qty 0.6

## 2020-07-03 MED ORDER — SODIUM CHLORIDE 0.9 % IV SOLN
Freq: Once | INTRAVENOUS | Status: AC
Start: 2020-07-03 — End: 2020-07-03
  Filled 2020-07-03: qty 250

## 2020-07-03 MED ORDER — ACETAMINOPHEN 325 MG PO TABS
650.0000 mg | ORAL_TABLET | Freq: Once | ORAL | Status: AC
  Administered 2020-07-03: 650 mg via ORAL
  Filled 2020-07-03: qty 2

## 2020-07-03 MED ORDER — DIPHENHYDRAMINE HCL 25 MG PO CAPS
50.0000 mg | ORAL_CAPSULE | Freq: Once | ORAL | Status: AC
  Administered 2020-07-03: 50 mg via ORAL
  Filled 2020-07-03: qty 2

## 2020-07-03 MED ORDER — SODIUM CHLORIDE 0.9 % IV SOLN
1.4000 mg/m2 | Freq: Once | INTRAVENOUS | Status: AC
  Administered 2020-07-03: 3 mg via INTRAVENOUS
  Filled 2020-07-03: qty 2

## 2020-07-03 MED ORDER — HEPARIN SOD (PORK) LOCK FLUSH 100 UNIT/ML IV SOLN
500.0000 [IU] | Freq: Once | INTRAVENOUS | Status: AC | PRN
  Administered 2020-07-03: 500 [IU]
  Filled 2020-07-03: qty 5

## 2020-07-03 MED ORDER — PALONOSETRON HCL INJECTION 0.25 MG/5ML
0.2500 mg | Freq: Once | INTRAVENOUS | Status: AC
  Administered 2020-07-03: 0.25 mg via INTRAVENOUS
  Filled 2020-07-03: qty 5

## 2020-07-03 MED ORDER — SODIUM CHLORIDE 0.9 % IV SOLN
10.0000 mg | Freq: Once | INTRAVENOUS | Status: AC
Start: 2020-07-03 — End: 2020-07-03
  Administered 2020-07-03: 10 mg via INTRAVENOUS
  Filled 2020-07-03: qty 10

## 2020-07-03 MED ORDER — DOXORUBICIN HCL CHEMO IV INJECTION 2 MG/ML
47.0000 mg/m2 | Freq: Once | INTRAVENOUS | Status: AC
  Administered 2020-07-03: 100 mg via INTRAVENOUS
  Filled 2020-07-03: qty 50

## 2020-07-03 MED ORDER — SODIUM CHLORIDE 0.9 % IV SOLN
375.0000 mg/m2 | Freq: Once | INTRAVENOUS | Status: AC
  Administered 2020-07-03: 800 mg via INTRAVENOUS
  Filled 2020-07-03: qty 50

## 2020-07-03 MED ORDER — SODIUM CHLORIDE 0.9 % IV SOLN
750.0000 mg/m2 | Freq: Once | INTRAVENOUS | Status: AC
  Administered 2020-07-03: 1600 mg via INTRAVENOUS
  Filled 2020-07-03: qty 80

## 2020-07-03 MED ORDER — SODIUM CHLORIDE 0.9 % IV SOLN
150.0000 mg | Freq: Once | INTRAVENOUS | Status: AC
  Administered 2020-07-03: 150 mg via INTRAVENOUS
  Filled 2020-07-03: qty 150

## 2020-07-03 NOTE — Progress Notes (Signed)
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 This was followed by a PET CT scan which showed extensive FDG avid adenopathy in the neck chest abdomen and pelvis. FDG avid splenic lesions. Solitary intramuscular FDG avid lesion in the right biceps femoris muscle.  Supraclavicular excisional lymph node biopsy showed high-grade B-cell lymphoma germinal center type Ki-67 greater than 95%.  FISH testing was positive for Bcl-2 BCL6 and MYC consistent with triple hit lymphoma  Bone marrow biopsy showed involvement with low-grade B-cell lymphoproliferative disorder with no evidence of High-grade B-cell lymphoma.  Interval history-patient is here with his daughter today.  He reports doing well overall.  Denies any shortness of breath.  Reports mild fatigue.  ECOG PS- 1 Pain scale- 0 Opioid associated constipation- no  Review of systems- Review of Systems  Constitutional: Positive for malaise/fatigue. Negative for chills, fever and weight loss.  HENT: Negative for congestion, ear discharge and nosebleeds.   Eyes: Negative for blurred vision.  Respiratory: Negative for cough, hemoptysis, sputum production, shortness of breath and wheezing.   Cardiovascular: Negative for chest pain, palpitations, orthopnea and claudication.  Gastrointestinal: Negative for abdominal  pain, blood in stool, constipation, diarrhea, heartburn, melena, nausea and vomiting.  Genitourinary: Negative for dysuria, flank pain, frequency, hematuria and urgency.  Musculoskeletal: Negative for back pain, joint pain and myalgias.  Skin: Negative for rash.  Neurological: Negative for dizziness, tingling, focal weakness, seizures, weakness and headaches.  Endo/Heme/Allergies: Does not bruise/bleed easily.  Psychiatric/Behavioral: Negative for depression and suicidal ideas. The patient does not have insomnia.        No Known Allergies   Past Medical History:  Diagnosis Date  . Dyspnea   . Hypertension   . 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 COLONOSCOPY    . EXCISION MASS NECK Right 06/12/2020   Procedure: EXCISION MASS NECK;  Surgeon: Jules Husbands, MD;  Location: [facility] ORS;  Service: General;  Laterality: Right;  . HERNIA REPAIR Right    at age 55-RIH  . PORTACATH PLACEMENT Right 06/24/2020   Procedure: INSERTION PORT-A-CATH;  Surgeon: Jules Husbands, MD;  Location: [facility] ORS;  Service: General;  Laterality: Right;    Social History   Socioeconomic History  . Marital status: Single    Spouse name: Not on file  . Number of children: Not on file  . Years of education: Not on file  . Highest education level: Not on file  Occupational History  . Not on file  Tobacco Use  . Smoking status: Former Smoker    Types: Cigarettes    Quit date: 06/13/2020    Years since quitting: 0.0  . Smokeless tobacco: Never Used  Vaping Use  . Vaping Use: Never used  Substance and Sexual Activity  . Alcohol use: Not Currently  . Drug use: Yes  Types: Marijuana  . Sexual activity: Not Currently  Other Topics Concern  . Not on file  Social History Narrative   Has daughter and grandson in the home   Social Determinants of Health   Financial Resource Strain: Not on file  Food Insecurity: Not on file   Transportation Needs: Not on file  Physical Activity: Not on file  Stress: Not on file  Social Connections: Not on file  Intimate Partner Violence: Not on file    Family History  Problem Relation Age of Onset  . Anemia Mother   . Hypertension Mother   . Goiter Mother   . Cancer Father   . Cancer Sister   . Multiple sclerosis Sister      Current Outpatient Medications:  .  aspirin EC 81 MG tablet, Take 81 mg by mouth daily as needed (heart health). Swallow whole., Disp: , Rfl:  .  atenolol (TENORMIN) 25 MG tablet, Take 25 mg by mouth daily., Disp: , Rfl:  .  lidocaine-prilocaine (EMLA) cream, Apply to affected area once, Disp: 30 g, Rfl: 3 .  lisinopril (ZESTRIL) 10 MG tablet, Take 10 mg by mouth daily., Disp: , Rfl:  .  Tenofovir Alafenamide Fumarate 25 MG TABS, Take 1 tablet (25 mg total) by mouth daily., Disp: 30 tablet, Rfl: 3 .  VITAMIN D PO, Take 1 capsule by mouth daily., Disp: , Rfl:  .  allopurinol (ZYLOPRIM) 300 MG tablet, Take 1 tablet (300 mg total) by mouth daily. (Patient not taking: Reported on 07/03/2020), Disp: 30 tablet, Rfl: 3 .  HYDROcodone-acetaminophen (NORCO/VICODIN) 5-325 MG tablet, Take 1 tablet by mouth every 6 (six) hours as needed for moderate pain. (Patient not taking: No sig reported), Disp: 15 tablet, Rfl: 0 .  ondansetron (ZOFRAN) 8 MG tablet, Take 1 tablet (8 mg total) by mouth 2 (two) times daily as needed for refractory nausea / vomiting. Start on day 3 after cyclophosphamide chemotherapy. (Patient not taking: Reported on 07/03/2020), Disp: 30 tablet, Rfl: 1 .  predniSONE (DELTASONE) 50 MG tablet, Take 2 tablets each day with food early in am for 5 days while on chemo treatment (Patient not taking: Reported on 07/03/2020), Disp: 10 tablet, Rfl: 0 .  prochlorperazine (COMPAZINE) 10 MG tablet, Take 1 tablet (10 mg total) by mouth every 6 (six) hours as needed (Nausea or vomiting). (Patient not taking: Reported on 07/03/2020), Disp: 30 tablet, Rfl: 6 No  current facility-administered medications for this visit.  Facility-Administered Medications Ordered in Other Visits:  .  heparin lock flush 100 unit/mL, 500 Units, Intracatheter, Once PRN, Sindy Guadeloupe, MD .  pegfilgrastim (NEULASTA ONPRO KIT) injection 6 mg, 6 mg, Subcutaneous, Once, Sindy Guadeloupe, MD  Physical exam:  Vitals:   07/03/20 0926  BP: 136/71  Pulse: (!) 57  Resp: 16  Temp: (!) 97 F (36.1 C)  TempSrc: Tympanic  Weight: 191 lb 12.8 oz (87 kg)  Height: $Remove'6\' 1"'TJcXgJu$  (1.854 m)   Physical Exam Cardiovascular:     Rate and Rhythm: Normal rate and regular rhythm.     Heart sounds: Normal heart sounds.  Pulmonary:     Effort: Pulmonary effort is normal.     Breath sounds: Normal breath sounds.  Abdominal:     General: Bowel sounds are normal.     Palpations: Abdomen is soft.  Lymphadenopathy:     Comments: Bilateral non bulky cervical adenopathy  Skin:    General: Skin is warm and dry.  Neurological:     Mental  Status: He is alert and oriented to person, place, and time.      CMP Latest Ref Rng & Units 07/03/2020  Glucose 70 - 99 mg/dL 147(H)  BUN 6 - 20 mg/dL 18  Creatinine 0.61 - 1.24 mg/dL 1.03  Sodium 135 - 145 mmol/L 137  Potassium 3.5 - 5.1 mmol/L 3.2(L)  Chloride 98 - 111 mmol/L 101  CO2 22 - 32 mmol/L 25  Calcium 8.9 - 10.3 mg/dL 9.2  Total Protein 6.5 - 8.1 g/dL 6.5  Total Bilirubin 0.3 - 1.2 mg/dL 0.5  Alkaline Phos 38 - 126 U/L 81  AST 15 - 41 U/L 33  ALT 0 - 44 U/L 32   CBC Latest Ref Rng & Units 07/03/2020  WBC 4.0 - 10.5 K/uL 5.3  Hemoglobin 13.0 - 17.0 g/dL 12.9(L)  Hematocrit 39.0 - 52.0 % 37.4(L)  Platelets 150 - 400 K/uL 225    No images are attached to the encounter.  NM Cardiac Muga Rest  Result Date: 07/01/2020 CLINICAL DATA:  High-grade B-cell lymphoma, pre cardiotoxic chemotherapy EXAM: NUCLEAR MEDICINE CARDIAC BLOOD POOL IMAGING (MUGA) TECHNIQUE: Cardiac multi-gated acquisition was performed at rest following intravenous injection  of Tc-59mlabeled red blood cells. RADIOPHARMACEUTICALS:  21.059 mCi Tc-954mertechnetate in-vitro labeled red blood cells IV COMPARISON:  None FINDINGS: Calculated LEFT ventricular ejection fraction is 54.4%, normal. Study was obtained at a cardiac rate of 56 bpm. Patient was rhythmic during imaging. Cine analysis of the LEFT ventricle in 3 projections demonstrates normal LV wall motion. IMPRESSION: Normal LEFT ventricular ejection fraction of 54.4%. Normal LEFT ventricular wall motion. Electronically Signed   By: MaLavonia Dana.D.   On: 07/01/2020 15:16   DG CHEST PORT 1 VIEW  Result Date: 06/24/2020 CLINICAL DATA:  Status post port placement EXAM: PORTABLE CHEST 1 VIEW COMPARISON:  10/21/2019 FINDINGS: Right-sided chest wall port is noted. Catheter tip is seen in the mid superior vena cava in satisfactory position. No pneumothorax is noted. Persistent fullness in the right paratracheal space is noted consistent with the patient's known history. Cardiac shadow is within normal limits. Mild bibasilar atelectasis is noted related to a poor inspiratory effort. No bony abnormality is seen. IMPRESSION: No pneumothorax following port placement. Electronically Signed   By: MaInez Catalina.D.   On: 06/24/2020 11:28   CT BONE MARROW BIOPSY & ASPIRATION  Result Date: 06/25/2020 INDICATION: New diagnosis of lymphoma. Please perform CT-guided bone marrow biopsy for tissue diagnostic purposes. EXAM: CT-GUIDED BONE MARROW BIOPSY AND ASPIRATION MEDICATIONS: None ANESTHESIA/SEDATION: Fentanyl 50 mcg IV; Versed 1 mg IV Sedation Time: 10 Minutes; The patient was continuously monitored during the procedure by the interventional radiology nurse under my direct supervision. COMPLICATIONS: None immediate. PROCEDURE: Informed consent was obtained from the patient following an explanation of the procedure, risks, benefits and alternatives. The patient understands, agrees and consents for the procedure. All questions were addressed.  A time out was performed prior to the initiation of the procedure. The patient was positioned prone and non-contrast localization CT was performed of the pelvis to demonstrate the iliac marrow spaces. The operative site was prepped and draped in the usual sterile fashion. Under sterile conditions and local anesthesia, a 22 gauge spinal needle was utilized for procedural planning. Next, an 11 gauge coaxial bone biopsy needle was advanced into the left iliac marrow space. Needle position was confirmed with CT imaging. Initially, a bone marrow aspiration was performed. Next, a bone marrow biopsy was obtained with the 11 gauge outer bone marrow  device. Samples were prepared with the cytotechnologist and deemed adequate. The needle was removed and superficial hemostasis was obtained with manual compression. A dressing was applied. The patient tolerated the procedure well without immediate post procedural complication. IMPRESSION: Successful CT guided left iliac bone marrow aspiration and core biopsy. Electronically Signed   By: Sandi Mariscal M.D.   On: 06/25/2020 10:30   DG C-Arm 1-60 Min-No Report  Result Date: 06/24/2020 Fluoroscopy was utilized by the requesting physician.  No radiographic interpretation.     Assessment and plan- Patient is a 60 y.o. male with newly diagnosed triple hit high-grade B-cell lymphoma stage IV here for on treatment assessment prior to cycle 1 of R-CHOP chemotherapy  In order to prevent any further delays in his treatment and given the hesitancy for inpatient admission we are proceeding with cycle 1 of R-CHOP chemotherapy.  Starting cycle 2 patient will be receiving dose adjusted R-EPOCH chemotherapy.  Again discussed risks and benefits of chemotherapy including all but not limited to nausea, vomiting, low blood counts, risk of infections and hospitalizations.  Treatment will be given with a curative intent with plans to repeat scans after 3 cycles.  Patient will also be receiving  intrathecal methotrexate next week.  Patient is not vaccinated against COVID and will receive a Evushield next week as well.    He will start taking prednisone 100 mg starting tomorrow for 5 days.  After 1 round of prednisone his LDH is already down from 1318 to 514.  No evidence of spontaneous tumor lysis syndrome yet.  He will continue allopurinol prophylaxis.  G6PD levels are pending.  Plan to repeat CBC with differential twice a week starting next week along with BMP.  I will monitor his [place] Nadir and decide to adjust his dose adjusted EPOCH-R chemotherapy for cycle 2 accordingly.  Vincristine doses not capped and will be given at 1.4 mg per metered square.  He is also hepatitis B core antibody positive.  He is on tenofovir for hepatitis B prophylaxis and I would refer him to GI as well for further monitoring  I will see him next week for labs and possible IV fluids.  Reiterated the importance of IV hydration during chemotherapy to reduce risk of tumor lysis syndrome.  He will also need to continue allopurinol.   Visit Diagnosis 1. High grade B-cell lymphoma ([place])   2. Encounter for antineoplastic chemotherapy   3. Visit for monitoring Rituxan therapy      Dr. Randa Evens, MD, MPH [facility] [phone] 07/03/2020 1:12 PM

## 2020-07-03 NOTE — Progress Notes (Signed)
MD does not want to cap dose of vincristine at 2mg 

## 2020-07-03 NOTE — Progress Notes (Signed)
Pt here to start new chemo today. Has daughter with him. No pain . Eats and drinks good. Had hard time sleeping one day this week due to prednisone it kept him up til the morning.

## 2020-07-04 ENCOUNTER — Encounter: Payer: Self-pay | Admitting: *Deleted

## 2020-07-04 ENCOUNTER — Telehealth: Payer: Self-pay

## 2020-07-04 LAB — GLUCOSE 6 PHOSPHATE DEHYDROGENASE
G6PDH: 8.3 U/g{Hb} (ref 5.5–14.2)
Hemoglobin: 13.1 g/dL (ref 13.0–17.7)

## 2020-07-04 NOTE — Progress Notes (Signed)
Patient on schedule for LP Methotrexate injection 07/08/2020, called and spoke with patient with pre procedure instructions given. Made aware to be here @ 1030, and driver for post procedure/discharge.will be on bedrest post procedure until discharge. Patient stated since he has been having procedures, has not been taking aspirin. (Not on aspirin per MD orders.)

## 2020-07-04 NOTE — Telephone Encounter (Signed)
Error

## 2020-07-04 NOTE — Telephone Encounter (Signed)
Telephone call to patient for follow up after receiving first infusion.   Patient states infusion went great.  States eating good and drinking plenty of fluids.   Denies any nausea or vomiting.  Encouraged patient to call for any concerns or questions. 

## 2020-07-06 ENCOUNTER — Encounter: Payer: Self-pay | Admitting: Oncology

## 2020-07-07 ENCOUNTER — Inpatient Hospital Stay: Payer: Self-pay

## 2020-07-07 ENCOUNTER — Other Ambulatory Visit: Payer: Self-pay

## 2020-07-07 ENCOUNTER — Other Ambulatory Visit: Payer: Self-pay | Admitting: *Deleted

## 2020-07-07 VITALS — BP 115/75 | HR 71 | Temp 97.2°F

## 2020-07-07 DIAGNOSIS — Z95828 Presence of other vascular implants and grafts: Secondary | ICD-10-CM

## 2020-07-07 DIAGNOSIS — C8510 Unspecified B-cell lymphoma, unspecified site: Secondary | ICD-10-CM

## 2020-07-07 LAB — CBC WITH DIFFERENTIAL/PLATELET
Abs Immature Granulocytes: 6.73 10*3/uL — ABNORMAL HIGH (ref 0.00–0.07)
Basophils Absolute: 0 10*3/uL (ref 0.0–0.1)
Basophils Relative: 0 %
Eosinophils Absolute: 0.1 10*3/uL (ref 0.0–0.5)
Eosinophils Relative: 0 %
HCT: 36.7 % — ABNORMAL LOW (ref 39.0–52.0)
Hemoglobin: 12.6 g/dL — ABNORMAL LOW (ref 13.0–17.0)
Immature Granulocytes: 28 %
Lymphocytes Relative: 4 %
Lymphs Abs: 0.9 10*3/uL (ref 0.7–4.0)
MCH: 29.7 pg (ref 26.0–34.0)
MCHC: 34.3 g/dL (ref 30.0–36.0)
MCV: 86.6 fL (ref 80.0–100.0)
Monocytes Absolute: 0.1 10*3/uL (ref 0.1–1.0)
Monocytes Relative: 0 %
Neutro Abs: 16 10*3/uL — ABNORMAL HIGH (ref 1.7–7.7)
Neutrophils Relative %: 68 %
Platelets: 98 10*3/uL — ABNORMAL LOW (ref 150–400)
RBC: 4.24 MIL/uL (ref 4.22–5.81)
RDW: 13.7 % (ref 11.5–15.5)
Smear Review: NORMAL
WBC: 23.8 10*3/uL — ABNORMAL HIGH (ref 4.0–10.5)
nRBC: 0 % (ref 0.0–0.2)

## 2020-07-07 LAB — COMPREHENSIVE METABOLIC PANEL
ALT: 24 U/L (ref 0–44)
AST: 23 U/L (ref 15–41)
Albumin: 3.8 g/dL (ref 3.5–5.0)
Alkaline Phosphatase: 84 U/L (ref 38–126)
Anion gap: 11 (ref 5–15)
BUN: 17 mg/dL (ref 6–20)
CO2: 25 mmol/L (ref 22–32)
Calcium: 9.3 mg/dL (ref 8.9–10.3)
Chloride: 101 mmol/L (ref 98–111)
Creatinine, Ser: 0.95 mg/dL (ref 0.61–1.24)
GFR, Estimated: >60 mL/min (ref >60)
Glucose, Bld: 90 mg/dL (ref 70–99)
Potassium: 3.6 mmol/L (ref 3.5–5.1)
Sodium: 137 mmol/L (ref 135–145)
Total Bilirubin: 0.9 mg/dL (ref 0.3–1.2)
Total Protein: 6.9 g/dL (ref 6.5–8.1)

## 2020-07-07 MED ORDER — SODIUM CHLORIDE 0.9% FLUSH
10.0000 mL | INTRAVENOUS | Status: DC | PRN
  Administered 2020-07-07: 10 mL via INTRAVENOUS
  Filled 2020-07-07: qty 10

## 2020-07-07 MED ORDER — HEPARIN SOD (PORK) LOCK FLUSH 100 UNIT/ML IV SOLN
INTRAVENOUS | Status: AC
  Filled 2020-07-07: qty 5

## 2020-07-07 MED ORDER — HEPARIN SOD (PORK) LOCK FLUSH 100 UNIT/ML IV SOLN
500.0000 [IU] | Freq: Once | INTRAVENOUS | Status: AC
  Administered 2020-07-07: 500 [IU] via INTRAVENOUS
  Filled 2020-07-07: qty 5

## 2020-07-07 NOTE — Patient Instructions (Signed)
[facility] [place] [facility]  Discharge Instructions: Thank you for choosing [facility] to provide your oncology and hematology care.  If you have a lab appointment with the Cancer Center, please go directly to the Cancer Center and check in at the registration area.  Wear comfortable clothing and clothing appropriate for easy access to any Portacath or PICC line.   We strive to give you quality time with your provider. You may need to reschedule your appointment if you arrive late (15 or more minutes).  Arriving late affects you and other patients whose appointments are after yours.  Also, if you miss three or more appointments without notifying the office, you may be dismissed from the clinic at the provider's discretion.      For prescription refill requests, have your pharmacy contact our office and allow 72 hours for refills to be completed.      To help prevent nausea and vomiting after your treatment, we encourage you to take your nausea medication as directed.  BELOW ARE SYMPTOMS THAT SHOULD BE REPORTED IMMEDIATELY: *FEVER GREATER THAN 100.4 F (38 C) OR HIGHER *CHILLS OR SWEATING *NAUSEA AND VOMITING THAT IS NOT CONTROLLED WITH YOUR NAUSEA MEDICATION *UNUSUAL SHORTNESS OF BREATH *UNUSUAL BRUISING OR BLEEDING *URINARY PROBLEMS (pain or burning when urinating, or frequent urination) *BOWEL PROBLEMS (unusual diarrhea, constipation, pain near the anus) TENDERNESS IN MOUTH AND THROAT WITH OR WITHOUT PRESENCE OF ULCERS (sore throat, sores in mouth, or a toothache) UNUSUAL RASH, SWELLING OR PAIN  UNUSUAL VAGINAL DISCHARGE OR ITCHING   Items with * indicate a potential emergency and should be followed up as soon as possible or go to the Emergency Department if any problems should occur.  Please show the CHEMOTHERAPY ALERT CARD or IMMUNOTHERAPY ALERT CARD at check-in to the Emergency Department and triage nurse.  Should you have questions after your  visit or need to cancel or reschedule your appointment, please contact [facility] [place] [facility]  [phone] and follow the prompts.  Office hours are 8:00 a.m. to 4:30 p.m. Monday - Friday. Please note that voicemails left after 4:00 p.m. may not be returned until the following business day.  We are closed weekends and major holidays. You have access to a nurse at all times for urgent questions. Please call the main number to the clinic [phone] and follow the prompts.  For any non-urgent questions, you may also contact your provider using MyChart. We now offer e-Visits for anyone 18 and older to request care online for non-urgent symptoms. For details visit [email or web address].   Also download the MyChart app! Go to the app store, search "MyChart", open the app, select [place], and log in with your MyChart username and password.  Due to Covid, a mask is required upon entering the hospital/clinic. If you do not have a mask, one will be given to you upon arrival. For doctor visits, patients may have 1 support person aged 18 or older with them. For treatment visits, patients cannot have anyone with them due to current Covid guidelines and our immunocompromised population.  

## 2020-07-07 NOTE — Progress Notes (Signed)
Pt here for labs/ possible IVF.  Pt states feeling well, states eating and drinking well. VSS. Reviewed labs with Dr Janese Banks- no hydration today. Advised to continue with fluids. Keep appt as scheduled 4/28.  Call if any changes or concerns before. Pt verbalized understanding.

## 2020-07-08 ENCOUNTER — Ambulatory Visit
Admission: RE | Admit: 2020-07-08 | Discharge: 2020-07-08 | Disposition: A | Discharge status: Home / Self Care | Payer: Self-pay | Source: Ambulatory Visit | Attending: Oncology | Admitting: Oncology

## 2020-07-08 ENCOUNTER — Other Ambulatory Visit: Payer: Self-pay

## 2020-07-08 ENCOUNTER — Inpatient Hospital Stay: Payer: Self-pay

## 2020-07-08 VITALS — BP 128/89 | HR 76 | Temp 99.1°F | Resp 14

## 2020-07-08 DIAGNOSIS — C8510 Unspecified B-cell lymphoma, unspecified site: Secondary | ICD-10-CM | POA: Insufficient documentation

## 2020-07-08 IMAGING — RF DG FLUORO GUIDE SPINAL/SI JT INJ*L*
1 series · 1 of 1 positions shown · non-contrast
Comparison: None

CLINICAL DATA: Non-Hodgkin's lymphoma

EXAM:
DIAGNOSTIC LUMBAR PUNCTURE UNDER FLUOROSCOPIC GUIDANCE

[Series 1: cp_standard · 0.27mm/px · 1 of 1 slices shown]
[im 1/1]
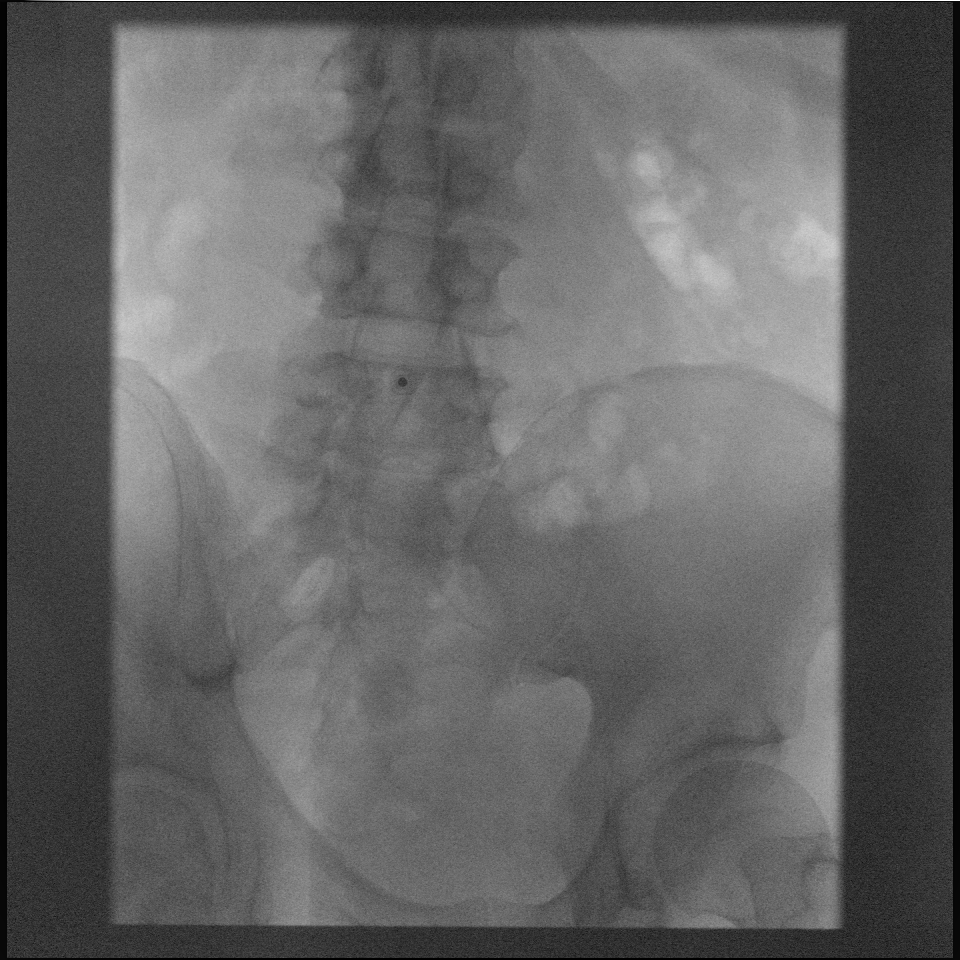

[1 of 1 positions shown; findings below may reference images not displayed]

FLUOROSCOPY TIME:  Fluoroscopy Time:  30 seconds

Radiation Exposure Index (if provided by the fluoroscopic device):
4.9 mGy

Number of Acquired Spot Images: 0

PROCEDURE:
Informed consent was obtained from the patient prior to the
procedure, including potential complications of headache, allergy,
and pain. With the patient prone, the lower back was prepped with
Betadine. 1% Lidocaine was used for local anesthesia. Lumbar
puncture was performed at the L4-5 level using a 22 gauge needle
with return of clear 5 ml of CSF were obtained for laboratory
studies. Subsequently, 12 mg of methotrexate in 5 mL was hand
injected into the thecal sac. The patient tolerated the procedure
well and there were no apparent complications.
IMPRESSION: Successful fluoroscopic guided lumbar puncture for methotrexate
injection.

## 2020-07-08 MED ORDER — METHOTREXATE SODIUM CHEMO INJECTION (PF) 50 MG/2ML
Freq: Once | INTRAMUSCULAR | Status: AC
Start: 2020-07-08 — End: 2020-07-08
  Filled 2020-07-08: qty 0.48

## 2020-07-08 MED ORDER — ACETAMINOPHEN 500 MG PO TABS: 1000.0000 mg | ORAL_TABLET | Freq: Four times a day (QID) | ORAL | Status: DC | PRN

## 2020-07-08 MED ORDER — ACETAMINOPHEN 500 MG PO TABS
ORAL_TABLET | ORAL | Status: AC
  Filled 2020-07-08: qty 2

## 2020-07-08 MED ORDER — LIDOCAINE HCL (PF) 1 % IJ SOLN
5.0000 mL | Freq: Once | INTRAMUSCULAR | Status: AC
  Administered 2020-07-08: 5 mL
  Filled 2020-07-08: qty 5

## 2020-07-08 MED ORDER — ACETAMINOPHEN 500 MG PO TABS
1000.0000 mg | ORAL_TABLET | Freq: Once | ORAL | Status: AC
  Administered 2020-07-08: 1000 mg via ORAL

## 2020-07-08 NOTE — Interval H&P Note (Signed)
History and Physical Interval Note:  07/08/2020 3:33 PM  Christopher Mills  has presented today for surgery, with the diagnosis of High Grade B-cell Lymphoma.  The various methods of treatment have been discussed with the patient and family. After consideration of risks, benefits and other options for treatment, the patient has consented to  Procedure(s): INSERTION PORT-A-CATH (Right) as a surgical intervention.  The patient's history has been reviewed, patient examined, no change in status, stable for surgery.  I have reviewed the patient's chart and labs.  Questions were answered to the patient's satisfaction.     Roman [place]

## 2020-07-08 NOTE — OR Nursing (Signed)
Lower back dressing remains dry and intact without swelling.

## 2020-07-09 ENCOUNTER — Encounter (HOSPITAL_COMMUNITY): Payer: Self-pay | Admitting: Oncology

## 2020-07-09 LAB — SURGICAL PATHOLOGY

## 2020-07-10 ENCOUNTER — Inpatient Hospital Stay (HOSPITAL_BASED_OUTPATIENT_CLINIC_OR_DEPARTMENT_OTHER): Payer: Self-pay | Admitting: Oncology

## 2020-07-10 ENCOUNTER — Inpatient Hospital Stay: Payer: Self-pay

## 2020-07-10 ENCOUNTER — Other Ambulatory Visit: Payer: Self-pay | Admitting: *Deleted

## 2020-07-10 ENCOUNTER — Encounter: Payer: Self-pay | Admitting: Oncology

## 2020-07-10 VITALS — BP 120/87 | HR 80 | Temp 98.1°F | Resp 16 | Wt 176.0 lb

## 2020-07-10 DIAGNOSIS — R197 Diarrhea, unspecified: Secondary | ICD-10-CM

## 2020-07-10 DIAGNOSIS — C8510 Unspecified B-cell lymphoma, unspecified site: Secondary | ICD-10-CM

## 2020-07-10 LAB — COMP PANEL: LEUKEMIA/LYMPHOMA

## 2020-07-10 LAB — COMPREHENSIVE METABOLIC PANEL
ALT: 37 U/L (ref 0–44)
AST: 27 U/L (ref 15–41)
Albumin: 3.9 g/dL (ref 3.5–5.0)
Alkaline Phosphatase: 88 U/L (ref 38–126)
Anion gap: 9 (ref 5–15)
BUN: 24 mg/dL — ABNORMAL HIGH (ref 6–20)
CO2: 26 mmol/L (ref 22–32)
Calcium: 9.4 mg/dL (ref 8.9–10.3)
Chloride: 99 mmol/L (ref 98–111)
Creatinine, Ser: 1.07 mg/dL (ref 0.61–1.24)
GFR, Estimated: >60 mL/min (ref >60)
Glucose, Bld: 105 mg/dL — ABNORMAL HIGH (ref 70–99)
Potassium: 3.7 mmol/L (ref 3.5–5.1)
Sodium: 134 mmol/L — ABNORMAL LOW (ref 135–145)
Total Bilirubin: 0.9 mg/dL (ref 0.3–1.2)
Total Protein: 6.9 g/dL (ref 6.5–8.1)

## 2020-07-10 LAB — CBC WITH DIFFERENTIAL/PLATELET
Abs Immature Granulocytes: 0.04 10*3/uL (ref 0.00–0.07)
Basophils Absolute: 0 10*3/uL (ref 0.0–0.1)
Basophils Relative: 2 %
Eosinophils Absolute: 0.1 10*3/uL (ref 0.0–0.5)
Eosinophils Relative: 7 %
HCT: 39.3 % (ref 39.0–52.0)
Hemoglobin: 13.4 g/dL (ref 13.0–17.0)
Immature Granulocytes: 4 %
Lymphocytes Relative: 60 %
Lymphs Abs: 0.6 10*3/uL — ABNORMAL LOW (ref 0.7–4.0)
MCH: 29.1 pg (ref 26.0–34.0)
MCHC: 34.1 g/dL (ref 30.0–36.0)
MCV: 85.4 fL (ref 80.0–100.0)
Monocytes Absolute: 0.1 10*3/uL (ref 0.1–1.0)
Monocytes Relative: 5 %
Neutro Abs: 0.2 10*3/uL — CL (ref 1.7–7.7)
Neutrophils Relative %: 22 %
Platelets: 79 10*3/uL — ABNORMAL LOW (ref 150–400)
RBC: 4.6 MIL/uL (ref 4.22–5.81)
RDW: 13 % (ref 11.5–15.5)
Smear Review: DECREASED
WBC Morphology: ABNORMAL
WBC: 1.1 10*3/uL — CL (ref 4.0–10.5)
nRBC: 0 % (ref 0.0–0.2)

## 2020-07-10 LAB — URIC ACID: Uric Acid, Serum: 6.6 mg/dL (ref 3.7–8.6)

## 2020-07-10 LAB — LACTATE DEHYDROGENASE: LDH: 352 U/L — ABNORMAL HIGH (ref 98–192)

## 2020-07-10 MED ORDER — SODIUM CHLORIDE 0.9% FLUSH
10.0000 mL | Freq: Once | INTRAVENOUS | Status: AC
  Administered 2020-07-10: 10 mL via INTRAVENOUS
  Filled 2020-07-10: qty 10

## 2020-07-10 MED ORDER — SODIUM CHLORIDE 0.9 % IV SOLN
Freq: Once | INTRAVENOUS | Status: AC
  Filled 2020-07-10: qty 250

## 2020-07-10 MED ORDER — HEPARIN SOD (PORK) LOCK FLUSH 100 UNIT/ML IV SOLN
INTRAVENOUS | Status: AC
  Filled 2020-07-10: qty 5

## 2020-07-10 MED ORDER — HEPARIN SOD (PORK) LOCK FLUSH 100 UNIT/ML IV SOLN
500.0000 [IU] | Freq: Once | INTRAVENOUS | Status: AC
  Administered 2020-07-10: 500 [IU] via INTRAVENOUS
  Filled 2020-07-10: qty 5

## 2020-07-10 NOTE — Progress Notes (Signed)
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 This was followed by a PET CT scan which showed extensive FDG avid adenopathy in the neck chest abdomen and pelvis. FDG avid splenic lesions. Solitary intramuscular FDG avid lesion in the right biceps femoris muscle.  Supraclavicular excisional lymph node biopsy showed high-grade B-cell lymphoma [facility] type Ki-67 greater than 95%. FISH testing was positive for Bcl-2 BCL6 and MYC consistent with triple hit lymphoma.  By NCCN IPI score would be 4 based on age and elevated LDH and stage IV (intramuscular biceps femoris lesion) which puts him in the high intermediate risk group. CNS IPI score 4   Bone marrow biopsy showed involvement with low-grade B-cell lymphoproliferative disorder with no evidence ofHigh-grade B-cell lymphoma.  Patient received RCHOP for cycle 1 with plans for DA [facility] with cycle 2. IT MTX for CNS prophylaxis.   Interval history-patient reports tolerating chemotherapy well.  He has had some diarrhea since yesterday.  Had about 4-5 episodes yesterday and 2 times this morning.  Diarrhea is usually scant and watery.  Denies any blood in his stool.  Denies any significant nausea or vomiting.  Tolerated intrathecal methotrexate well.  ECOG PS- 1 Pain scale- 0 Opioid associated constipation- no  Review  of systems- Review of Systems  Constitutional: Positive for malaise/fatigue. Negative for chills, fever and weight loss.  HENT: Negative for congestion, ear discharge and nosebleeds.   Eyes: Negative for blurred vision.  Respiratory: Negative for cough, hemoptysis, sputum production, shortness of breath and wheezing.   Cardiovascular: Negative for chest pain, palpitations, orthopnea and claudication.  Gastrointestinal: Positive for diarrhea. Negative for abdominal pain, blood in stool, constipation, heartburn, melena, nausea and vomiting.  Genitourinary: Negative for dysuria, flank pain, frequency, hematuria and urgency.  Musculoskeletal: Negative for back pain, joint pain and myalgias.  Skin: Negative for rash.  Neurological: Negative for dizziness, tingling, focal weakness, seizures, weakness and headaches.  Endo/Heme/Allergies: Does not bruise/bleed easily.  Psychiatric/Behavioral: Negative for depression and suicidal ideas. The patient does not have insomnia.        No Known Allergies   Past Medical History:  Diagnosis Date  . Dyspnea   . Hypertension   . Lymphoma of lymph nodes of neck ([place]) 06/18/2020     Past Surgical History:  Procedure Laterality Date  . BONE MARROW BIOPSY    . COLONOSCOPY    . EXCISION MASS NECK Right 06/12/2020   Procedure: EXCISION MASS NECK;  Surgeon: Jules Husbands, MD;  Location: [facility] ORS;  Service: General;  Laterality: Right;  . HERNIA REPAIR Right    at age 3-RIH  . PORTA CATH INSERTION    . PORTACATH PLACEMENT Right 06/24/2020   Procedure: INSERTION PORT-A-CATH;  Surgeon: Jules Husbands, MD;  Location: [facility] ORS;  Service: General;  Laterality: Right;    Social History   Socioeconomic History  . Marital status: Single    Spouse name: Not  on file  . Number of children: Not on file  . Years of education: Not on file  . Highest education level: Not on file  Occupational History  . Not on file  Tobacco Use  . Smoking status: Former  Smoker    Types: Cigarettes    Quit date: 06/13/2020    Years since quitting: 0.0  . Smokeless tobacco: Never Used  Vaping Use  . Vaping Use: Never used  Substance and Sexual Activity  . Alcohol use: Not Currently  . Drug use: Not Currently    Types: Marijuana  . Sexual activity: Not Currently  Other Topics Concern  . Not on file  Social History Narrative   Has daughter and grandson in the home   Social Determinants of Health   Financial Resource Strain: Not on file  Food Insecurity: Not on file  Transportation Needs: Not on file  Physical Activity: Not on file  Stress: Not on file  Social Connections: Not on file  Intimate Partner Violence: Not on file    Family History  Problem Relation Age of Onset  . Anemia Mother   . Hypertension Mother   . Goiter Mother   . Cancer Father   . Cancer Sister   . Multiple sclerosis Sister      Current Outpatient Medications:  .  allopurinol (ZYLOPRIM) 300 MG tablet, Take 1 tablet (300 mg total) by mouth daily. (Patient not taking: Reported on 07/03/2020), Disp: 30 tablet, Rfl: 3 .  aspirin EC 81 MG tablet, Take 81 mg by mouth daily as needed (heart health). Swallow whole., Disp: , Rfl:  .  atenolol (TENORMIN) 25 MG tablet, Take 25 mg by mouth daily., Disp: , Rfl:  .  HYDROcodone-acetaminophen (NORCO/VICODIN) 5-325 MG tablet, Take 1 tablet by mouth every 6 (six) hours as needed for moderate pain. (Patient not taking: No sig reported), Disp: 15 tablet, Rfl: 0 .  lidocaine-prilocaine (EMLA) cream, Apply to affected area once, Disp: 30 g, Rfl: 3 .  lisinopril (ZESTRIL) 10 MG tablet, Take 10 mg by mouth daily., Disp: , Rfl:  .  ondansetron (ZOFRAN) 8 MG tablet, Take 1 tablet (8 mg total) by mouth 2 (two) times daily as needed for refractory nausea / vomiting. Start on day 3 after cyclophosphamide chemotherapy. (Patient not taking: Reported on 07/03/2020), Disp: 30 tablet, Rfl: 1 .  predniSONE (DELTASONE) 50 MG tablet, Take 2 tablets each day  with food early in am for 5 days while on chemo treatment (Patient not taking: Reported on 07/03/2020), Disp: 10 tablet, Rfl: 0 .  prochlorperazine (COMPAZINE) 10 MG tablet, Take 1 tablet (10 mg total) by mouth every 6 (six) hours as needed (Nausea or vomiting). (Patient not taking: Reported on 07/03/2020), Disp: 30 tablet, Rfl: 6 .  Tenofovir Alafenamide Fumarate 25 MG TABS, Take 1 tablet (25 mg total) by mouth daily., Disp: 30 tablet, Rfl: 3 .  VITAMIN D PO, Take 1 capsule by mouth daily., Disp: , Rfl:  No current facility-administered medications for this visit.  Facility-Administered Medications Ordered in Other Visits:  .  acetaminophen (TYLENOL) tablet 1,000 mg, 1,000 mg, Oral, Q6H PRN, Jennette Banker, MD  Physical exam:  Vitals:   07/10/20 0901  BP: 120/87  Pulse: 80  Resp: 16  Temp: 98.1 F (36.7 C)  Weight: 176 lb (79.8 kg)   Physical Exam HENT:     Mouth/Throat:     Mouth: Mucous membranes are moist.     Pharynx: Oropharynx is clear.  Cardiovascular:  Rate and Rhythm: Normal rate and regular rhythm.     Heart sounds: Normal heart sounds.  Pulmonary:     Effort: Pulmonary effort is normal.  Lymphadenopathy:     Comments: Cervical adenopathy has decreased  Skin:    General: Skin is warm and dry.  Neurological:     Mental Status: He is alert and oriented to person, place, and time.      CMP Latest Ref Rng & Units 07/07/2020  Glucose 70 - 99 mg/dL 90  BUN 6 - 20 mg/dL 17  Creatinine 0.61 - 1.24 mg/dL 0.95  Sodium 135 - 145 mmol/L 137  Potassium 3.5 - 5.1 mmol/L 3.6  Chloride 98 - 111 mmol/L 101  CO2 22 - 32 mmol/L 25  Calcium 8.9 - 10.3 mg/dL 9.3  Total Protein 6.5 - 8.1 g/dL 6.9  Total Bilirubin 0.3 - 1.2 mg/dL 0.9  Alkaline Phos 38 - 126 U/L 84  AST 15 - 41 U/L 23  ALT 0 - 44 U/L 24   CBC Latest Ref Rng & Units 07/07/2020  WBC 4.0 - 10.5 K/uL 23.8(H)  Hemoglobin 13.0 - 17.0 g/dL 12.6(L)  Hematocrit 39.0 - 52.0 % 36.7(L)  Platelets 150 - 400 K/uL 98(L)     No images are attached to the encounter.  NM Cardiac Muga Rest  Result Date: 07/01/2020 CLINICAL DATA:  High-grade B-cell lymphoma, pre cardiotoxic chemotherapy EXAM: NUCLEAR MEDICINE CARDIAC BLOOD POOL IMAGING (MUGA) TECHNIQUE: Cardiac multi-gated acquisition was performed at rest following intravenous injection of Tc-10m labeled red blood cells. RADIOPHARMACEUTICALS:  21.059 mCi Tc-72m pertechnetate in-vitro labeled red blood cells IV COMPARISON:  None FINDINGS: Calculated LEFT ventricular ejection fraction is 54.4%, normal. Study was obtained at a cardiac rate of 56 bpm. Patient was rhythmic during imaging. Cine analysis of the LEFT ventricle in 3 projections demonstrates normal LV wall motion. IMPRESSION: Normal LEFT ventricular ejection fraction of 54.4%. Normal LEFT ventricular wall motion. Electronically Signed   By: Lavonia Dana M.D.   On: 07/01/2020 15:16   DG CHEST PORT 1 VIEW  Result Date: 06/24/2020 CLINICAL DATA:  Status post port placement EXAM: PORTABLE CHEST 1 VIEW COMPARISON:  10/21/2019 FINDINGS: Right-sided chest wall port is noted. Catheter tip is seen in the mid superior vena cava in satisfactory position. No pneumothorax is noted. Persistent fullness in the right paratracheal space is noted consistent with the patient's known history. Cardiac shadow is within normal limits. Mild bibasilar atelectasis is noted related to a poor inspiratory effort. No bony abnormality is seen. IMPRESSION: No pneumothorax following port placement. Electronically Signed   By: Inez Catalina M.D.   On: 06/24/2020 11:28   CT BONE MARROW BIOPSY & ASPIRATION  Result Date: 06/25/2020 INDICATION: New diagnosis of lymphoma. Please perform CT-guided bone marrow biopsy for tissue diagnostic purposes. EXAM: CT-GUIDED BONE MARROW BIOPSY AND ASPIRATION MEDICATIONS: None ANESTHESIA/SEDATION: Fentanyl 50 mcg IV; Versed 1 mg IV Sedation Time: 10 Minutes; The patient was continuously monitored during the procedure  by the interventional radiology nurse under my direct supervision. COMPLICATIONS: None immediate. PROCEDURE: Informed consent was obtained from the patient following an explanation of the procedure, risks, benefits and alternatives. The patient understands, agrees and consents for the procedure. All questions were addressed. A time out was performed prior to the initiation of the procedure. The patient was positioned prone and non-contrast localization CT was performed of the pelvis to demonstrate the iliac marrow spaces. The operative site was prepped and draped in the usual sterile fashion. Under sterile conditions  and local anesthesia, a 22 gauge spinal needle was utilized for procedural planning. Next, an 11 gauge coaxial bone biopsy needle was advanced into the left iliac marrow space. Needle position was confirmed with CT imaging. Initially, a bone marrow aspiration was performed. Next, a bone marrow biopsy was obtained with the 11 gauge outer bone marrow device. Samples were prepared with the cytotechnologist and deemed adequate. The needle was removed and superficial hemostasis was obtained with manual compression. A dressing was applied. The patient tolerated the procedure well without immediate post procedural complication. IMPRESSION: Successful CT guided left iliac bone marrow aspiration and core biopsy. Electronically Signed   By: Sandi Mariscal M.D.   On: 06/25/2020 10:30   DG C-Arm 1-60 Min-No Report  Result Date: 06/24/2020 Fluoroscopy was utilized by the requesting physician.  No radiographic interpretation.   DG FLUORO GUIDED LOC OF NEEDLE/CATH TIP FOR SPINAL INJECT LT  Result Date: 07/08/2020 CLINICAL DATA:  Non-Hodgkin's lymphoma EXAM: DIAGNOSTIC LUMBAR PUNCTURE UNDER FLUOROSCOPIC GUIDANCE COMPARISON:  None FLUOROSCOPY TIME:  Fluoroscopy Time:  30 seconds Radiation Exposure Index (if provided by the fluoroscopic device): 4.9 mGy Number of Acquired Spot Images: 0 PROCEDURE: Informed consent  was obtained from the patient prior to the procedure, including potential complications of headache, allergy, and pain. With the patient prone, the lower back was prepped with Betadine. 1% Lidocaine was used for local anesthesia. Lumbar puncture was performed at the L4-5 level using a 22 gauge needle with return of clear 5 ml of CSF were obtained for laboratory studies. Subsequently, 12 mg of methotrexate in 5 mL was hand injected into the thecal sac. The patient tolerated the procedure well and there were no apparent complications. IMPRESSION: Successful fluoroscopic guided lumbar puncture for methotrexate injection. Electronically Signed   By: Kathreen Devoid   On: 07/08/2020 13:54     Assessment and plan- Patient is a 60 y.o. male with stage IV triple hit high-grade B-cell lymphoma s/p 1 cycle of R-CHOP here for routine follow-up  Plan is to dose adjusted R-EPOCH treatment with cycle 2.  His ANC is less than 0.5 today at 0.2.  Therefore there would be no dose escalation for cycle 2.  We will plan to add etoposide and he will receive this treatment as an outpatient day 1 to day 5.  I will see him on 07/28/2020 for cycle 2 of dose adjusted R-EPOCH.  Plan for intrathecal methotrexate on 08/04/2020.  MRI brain scheduled for 07/15/2020.  No evidence of tumor lysis syndrome.  Continue allopurinol prophylaxis.  CBC with differential to be checked twice a week next week and the following week.  Hepatitis B core antibody positive: On tenofovir.  Awaiting GI referral.  Diarrhea: Possibly secondary to chemotherapy.  We will give 1 L of IV fluids today.  Check C. difficile PCR and GI intestinal panel PCR.   Visit Diagnosis 1. High grade B-cell lymphoma ([place])      Dr. Randa Evens, MD, MPH [facility] at [facility] [phone] 07/10/2020 8:29 AM

## 2020-07-10 NOTE — Patient Instructions (Signed)
[facility] [place] [facility]  Discharge Instructions: Thank you for choosing [facility] to provide your oncology and hematology care.  If you have a lab appointment with the Cancer Center, please go directly to the Cancer Center and check in at the registration area.  Wear comfortable clothing and clothing appropriate for easy access to any Portacath or PICC line.   We strive to give you quality time with your provider. You may need to reschedule your appointment if you arrive late (15 or more minutes).  Arriving late affects you and other patients whose appointments are after yours.  Also, if you miss three or more appointments without notifying the office, you may be dismissed from the clinic at the provider's discretion.      For prescription refill requests, have your pharmacy contact our office and allow 72 hours for refills to be completed.    BELOW ARE SYMPTOMS THAT SHOULD BE REPORTED IMMEDIATELY: *FEVER GREATER THAN 100.4 F (38 C) OR HIGHER *CHILLS OR SWEATING *NAUSEA AND VOMITING THAT IS NOT CONTROLLED WITH YOUR NAUSEA MEDICATION *UNUSUAL SHORTNESS OF BREATH *UNUSUAL BRUISING OR BLEEDING *URINARY PROBLEMS (pain or burning when urinating, or frequent urination) *BOWEL PROBLEMS (unusual diarrhea, constipation, pain near the anus) TENDERNESS IN MOUTH AND THROAT WITH OR WITHOUT PRESENCE OF ULCERS (sore throat, sores in mouth, or a toothache) UNUSUAL RASH, SWELLING OR PAIN  UNUSUAL VAGINAL DISCHARGE OR ITCHING   Items with * indicate a potential emergency and should be followed up as soon as possible or go to the Emergency Department if any problems should occur.  Please show the CHEMOTHERAPY ALERT CARD or IMMUNOTHERAPY ALERT CARD at check-in to the Emergency Department and triage nurse.  Should you have questions after your visit or need to cancel or reschedule your appointment, please contact [facility] [place] [facility]   [phone] and follow the prompts.  Office hours are 8:00 a.m. to 4:30 p.m. Monday - Friday. Please note that voicemails left after 4:00 p.m. may not be returned until the following business day.  We are closed weekends and major holidays. You have access to a nurse at all times for urgent questions. Please call the main number to the clinic [phone] and follow the prompts.  For any non-urgent questions, you may also contact your provider using MyChart. We now offer e-Visits for anyone 18 and older to request care online for non-urgent symptoms. For details visit [email or web address].   Also download the MyChart app! Go to the app store, search "MyChart", open the app, select [facility], and log in with your MyChart username and password.  Due to Covid, a mask is required upon entering the hospital/clinic. If you do not have a mask, one will be given to you upon arrival. For doctor visits, patients may have 1 support person aged 18 or older with them. For treatment visits, patients cannot have anyone with them due to current Covid guidelines and our immunocompromised population.  

## 2020-07-10 NOTE — Progress Notes (Signed)
DISCONTINUE ON PATHWAY REGIMEN - Lymphoma and CLL     A cycle is every 21 days:     Prednisone      Rituximab-xxxx      Cyclophosphamide      Doxorubicin      Vincristine   **Always confirm dose/schedule in your pharmacy ordering system**  REASON: Other Reason PRIOR TREATMENT: [id number]: R(IV)-CHOP q21 Days x 6 Cycles TREATMENT RESPONSE: Unable to Evaluate  START ON PATHWAY REGIMEN - Lymphoma and CLL     A cycle is every 21 days:     Prednisone      Rituximab-xxxx      Etoposide      Doxorubicin      Vincristine      Cyclophosphamide      Filgrastim-xxxx   **Always confirm dose/schedule in your pharmacy ordering system**  Patient Characteristics: Double Hit Lymphoma, First Line Disease Type: Not Applicable Disease Type: Double Hit Lymphoma Disease Type: Not Applicable Line of therapy: First Line Intent of Therapy: Curative Intent, Discussed with Patient

## 2020-07-10 NOTE — Progress Notes (Signed)
Pt had spinal tap for methotrexate. Pt has stomach issues starting yest. And had loose stools 6 of them yest, and twice today

## 2020-07-10 NOTE — Progress Notes (Signed)
Pt received 1L IVF in clinic today per Dr Janese Banks. No complaints at d/c.

## 2020-07-12 ENCOUNTER — Emergency Department: Payer: Self-pay

## 2020-07-12 ENCOUNTER — Inpatient Hospital Stay
Admission: EM | Admit: 2020-07-12 | Discharge: 2020-07-16 | DRG: 871 | Disposition: A | Discharge status: Home / Self Care | Payer: Self-pay | Attending: Internal Medicine | Admitting: Internal Medicine

## 2020-07-12 ENCOUNTER — Telehealth: Payer: Self-pay | Admitting: Oncology

## 2020-07-12 ENCOUNTER — Other Ambulatory Visit: Payer: Self-pay

## 2020-07-12 DIAGNOSIS — Z87891 Personal history of nicotine dependence: Secondary | ICD-10-CM

## 2020-07-12 DIAGNOSIS — Z20822 Contact with and (suspected) exposure to covid-19: Secondary | ICD-10-CM | POA: Diagnosis present

## 2020-07-12 DIAGNOSIS — R6520 Severe sepsis without septic shock: Secondary | ICD-10-CM | POA: Diagnosis present

## 2020-07-12 DIAGNOSIS — K529 Noninfective gastroenteritis and colitis, unspecified: Secondary | ICD-10-CM | POA: Diagnosis present

## 2020-07-12 DIAGNOSIS — R197 Diarrhea, unspecified: Secondary | ICD-10-CM | POA: Diagnosis present

## 2020-07-12 DIAGNOSIS — E871 Hypo-osmolality and hyponatremia: Secondary | ICD-10-CM | POA: Diagnosis present

## 2020-07-12 DIAGNOSIS — Z515 Encounter for palliative care: Secondary | ICD-10-CM

## 2020-07-12 DIAGNOSIS — Z6823 Body mass index (BMI) 23.0-23.9, adult: Secondary | ICD-10-CM

## 2020-07-12 DIAGNOSIS — E861 Hypovolemia: Secondary | ICD-10-CM | POA: Diagnosis present

## 2020-07-12 DIAGNOSIS — R109 Unspecified abdominal pain: Secondary | ICD-10-CM | POA: Diagnosis present

## 2020-07-12 DIAGNOSIS — R1030 Lower abdominal pain, unspecified: Secondary | ICD-10-CM

## 2020-07-12 DIAGNOSIS — E43 Unspecified severe protein-calorie malnutrition: Secondary | ICD-10-CM | POA: Insufficient documentation

## 2020-07-12 DIAGNOSIS — D696 Thrombocytopenia, unspecified: Secondary | ICD-10-CM | POA: Diagnosis present

## 2020-07-12 DIAGNOSIS — C8510 Unspecified B-cell lymphoma, unspecified site: Secondary | ICD-10-CM | POA: Diagnosis present

## 2020-07-12 DIAGNOSIS — E860 Dehydration: Secondary | ICD-10-CM | POA: Diagnosis present

## 2020-07-12 DIAGNOSIS — A021 Salmonella sepsis: Principal | ICD-10-CM | POA: Diagnosis present

## 2020-07-12 DIAGNOSIS — N179 Acute kidney failure, unspecified: Secondary | ICD-10-CM | POA: Diagnosis present

## 2020-07-12 DIAGNOSIS — Z95828 Presence of other vascular implants and grafts: Secondary | ICD-10-CM

## 2020-07-12 DIAGNOSIS — R5081 Fever presenting with conditions classified elsewhere: Secondary | ICD-10-CM | POA: Diagnosis present

## 2020-07-12 DIAGNOSIS — Z820 Family history of epilepsy and other diseases of the nervous system: Secondary | ICD-10-CM

## 2020-07-12 DIAGNOSIS — D709 Neutropenia, unspecified: Secondary | ICD-10-CM | POA: Diagnosis present

## 2020-07-12 DIAGNOSIS — Z7982 Long term (current) use of aspirin: Secondary | ICD-10-CM

## 2020-07-12 DIAGNOSIS — A020 Salmonella enteritis: Secondary | ICD-10-CM

## 2020-07-12 DIAGNOSIS — Z8249 Family history of ischemic heart disease and other diseases of the circulatory system: Secondary | ICD-10-CM

## 2020-07-12 DIAGNOSIS — Z79899 Other long term (current) drug therapy: Secondary | ICD-10-CM

## 2020-07-12 DIAGNOSIS — R531 Weakness: Secondary | ICD-10-CM

## 2020-07-12 DIAGNOSIS — I10 Essential (primary) hypertension: Secondary | ICD-10-CM | POA: Diagnosis present

## 2020-07-12 DIAGNOSIS — A419 Sepsis, unspecified organism: Secondary | ICD-10-CM

## 2020-07-12 HISTORY — DX: Noninfective gastroenteritis and colitis, unspecified: K52.9

## 2020-07-12 HISTORY — DX: Acute kidney failure, unspecified: N17.9

## 2020-07-12 LAB — COMPREHENSIVE METABOLIC PANEL
ALT: 25 U/L (ref 0–44)
AST: 14 U/L — ABNORMAL LOW (ref 15–41)
Albumin: 3.6 g/dL (ref 3.5–5.0)
Alkaline Phosphatase: 73 U/L (ref 38–126)
Anion gap: 11 (ref 5–15)
BUN: 26 mg/dL — ABNORMAL HIGH (ref 6–20)
CO2: 21 mmol/L — ABNORMAL LOW (ref 22–32)
Calcium: 9.4 mg/dL (ref 8.9–10.3)
Chloride: 96 mmol/L — ABNORMAL LOW (ref 98–111)
Creatinine, Ser: 1.35 mg/dL — ABNORMAL HIGH (ref 0.61–1.24)
GFR, Estimated: >60 mL/min (ref >60)
Glucose, Bld: 120 mg/dL — ABNORMAL HIGH (ref 70–99)
Potassium: 4.1 mmol/L (ref 3.5–5.1)
Sodium: 128 mmol/L — ABNORMAL LOW (ref 135–145)
Total Bilirubin: 1.3 mg/dL — ABNORMAL HIGH (ref 0.3–1.2)
Total Protein: 7.2 g/dL (ref 6.5–8.1)

## 2020-07-12 LAB — CBC WITH DIFFERENTIAL/PLATELET
Abs Immature Granulocytes: 0.01 10*3/uL (ref 0.00–0.07)
Basophils Absolute: 0 10*3/uL (ref 0.0–0.1)
Basophils Relative: 1 %
Eosinophils Absolute: 0 10*3/uL (ref 0.0–0.5)
Eosinophils Relative: 1 %
HCT: 35.9 % — ABNORMAL LOW (ref 39.0–52.0)
Hemoglobin: 12.5 g/dL — ABNORMAL LOW (ref 13.0–17.0)
Immature Granulocytes: 1 %
Lymphocytes Relative: 50 %
Lymphs Abs: 0.4 10*3/uL — ABNORMAL LOW (ref 0.7–4.0)
MCH: 29.3 pg (ref 26.0–34.0)
MCHC: 34.8 g/dL (ref 30.0–36.0)
MCV: 84.3 fL (ref 80.0–100.0)
Monocytes Absolute: 0 10*3/uL — ABNORMAL LOW (ref 0.1–1.0)
Monocytes Relative: 1 %
Neutro Abs: 0.4 10*3/uL — CL (ref 1.7–7.7)
Neutrophils Relative %: 46 %
Platelets: 85 10*3/uL — ABNORMAL LOW (ref 150–400)
RBC: 4.26 MIL/uL (ref 4.22–5.81)
RDW: 12.8 % (ref 11.5–15.5)
Smear Review: NORMAL
WBC Morphology: ABNORMAL
WBC: 0.8 10*3/uL — CL (ref 4.0–10.5)
nRBC: 0 % (ref 0.0–0.2)

## 2020-07-12 LAB — PROTIME-INR
INR: 1.1 (ref 0.8–1.2)
Prothrombin Time: 13.7 seconds (ref 11.4–15.2)

## 2020-07-12 LAB — URINALYSIS, COMPLETE (UACMP) WITH MICROSCOPIC
Bacteria, UA: NONE SEEN
Bilirubin Urine: NEGATIVE
Glucose, UA: NEGATIVE mg/dL
Hgb urine dipstick: NEGATIVE
Ketones, ur: NEGATIVE mg/dL
Leukocytes,Ua: NEGATIVE
Nitrite: NEGATIVE
Protein, ur: NEGATIVE mg/dL
Specific Gravity, Urine: 1.031 — ABNORMAL HIGH (ref 1.005–1.030)
pH: 6 (ref 5.0–8.0)

## 2020-07-12 LAB — OSMOLALITY, URINE: Osmolality, Ur: 326 mOsm/kg (ref 300–900)

## 2020-07-12 LAB — CREATININE, URINE, RANDOM: Creatinine, Urine: 69 mg/dL

## 2020-07-12 LAB — SODIUM, URINE, RANDOM: Sodium, Ur: <10 mmol/L

## 2020-07-12 LAB — RESP PANEL BY RT-PCR (FLU A&B, COVID) ARPGX2
Influenza A by PCR: NEGATIVE
Influenza B by PCR: NEGATIVE
SARS Coronavirus 2 by RT PCR: NEGATIVE

## 2020-07-12 LAB — TSH: TSH: 0.467 u[IU]/mL (ref 0.350–4.500)

## 2020-07-12 LAB — LACTIC ACID, PLASMA
Lactic Acid, Venous: 1.5 mmol/L (ref 0.5–1.9)
Lactic Acid, Venous: 1.2 mmol/L (ref 0.5–1.9)

## 2020-07-12 LAB — LIPASE, BLOOD: Lipase: 21 U/L (ref 11–51)

## 2020-07-12 LAB — MAGNESIUM: Magnesium: 2.1 mg/dL (ref 1.7–2.4)

## 2020-07-12 LAB — OSMOLALITY: Osmolality: 274 mOsm/kg — ABNORMAL LOW (ref 275–295)

## 2020-07-12 IMAGING — CR DG CHEST 2V
2 series · 2 of 2 positions shown · non-contrast
Comparison: [DATE] and prior chest radiographs

CLINICAL DATA: 59-year-old male with weakness. Currently undergoing
treatment for lymphoma.

EXAM:
CHEST - 2 VIEW

[chest pa]
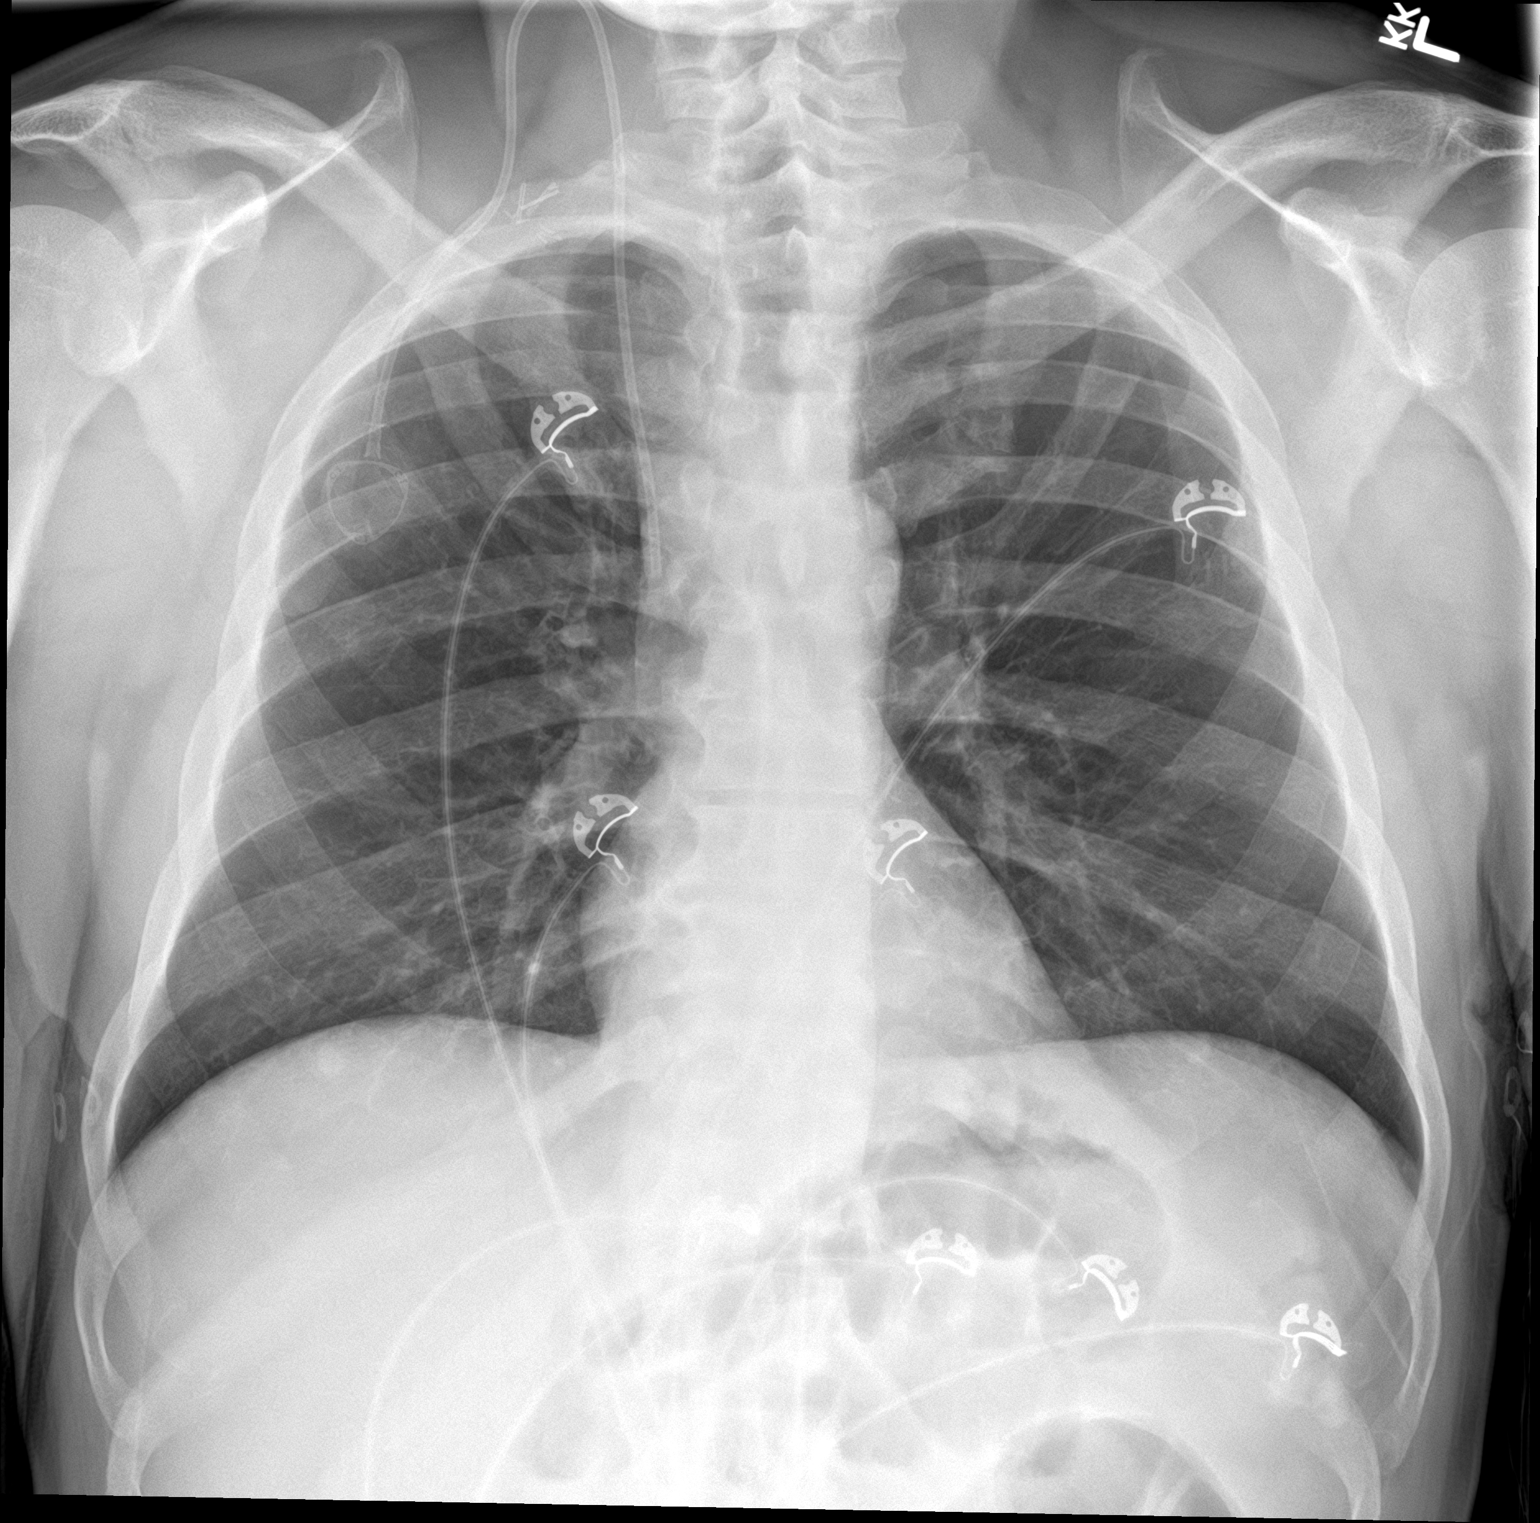

[chest lat]
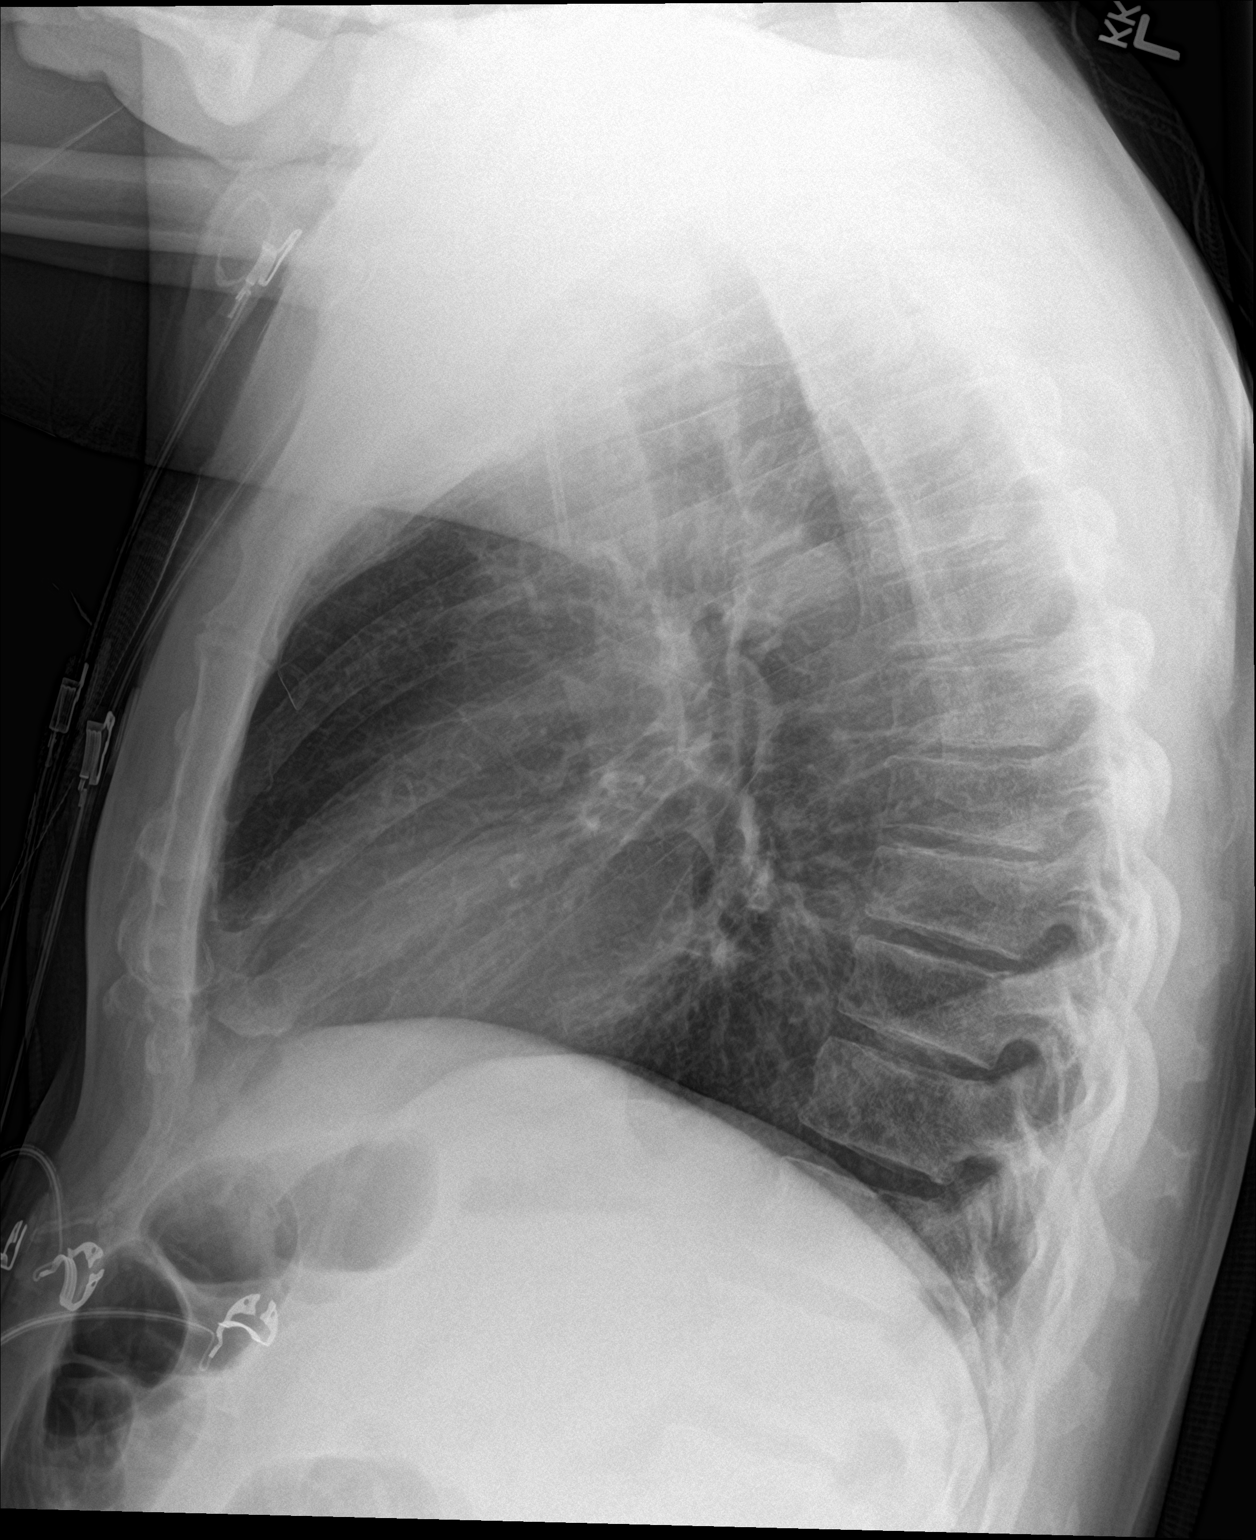

[2 of 2 positions shown; findings below may reference images not displayed]

FINDINGS: The cardiomediastinal silhouette is unchanged. Slightly prominent
SUPERIOR mediastinum again noted.

A RIGHT IJ Port-A-Cath is noted with tip overlying the mid SVC.

There is no evidence of focal airspace disease, pulmonary edema,
suspicious pulmonary nodule/mass, pleural effusion, or pneumothorax.

No acute bony abnormalities are identified.
IMPRESSION: No acute cardiopulmonary disease.

## 2020-07-12 IMAGING — CT CT ABD-PELV W/ CM
2 of 5 series · 15 of 46 positions shown, 17 images · IV contrast (APPLIED)
Comparison: PET CT dated [DATE].

CLINICAL DATA: Abdominal pain and fever.  Nausea and diarrhea.

EXAM:
CT ABDOMEN AND PELVIS WITH CONTRAST
TECHNIQUE: Multidetector CT imaging of the abdomen and pelvis was performed
using the standard protocol following bolus administration of
intravenous contrast.
CONTRAST:  100mL OMNIPAQUE IOHEXOL 300 MG/ML  SOLN

[Series 2: routine abd/pel with · axial · 0.75mm/px · z∈[-500,-75]mm · 12 of 95 slices shown, 14 images]
[im 5/95  soft-tissue]
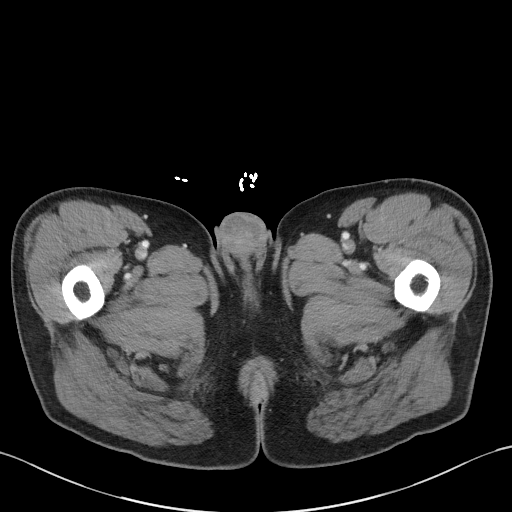
[im 5/95  bone]
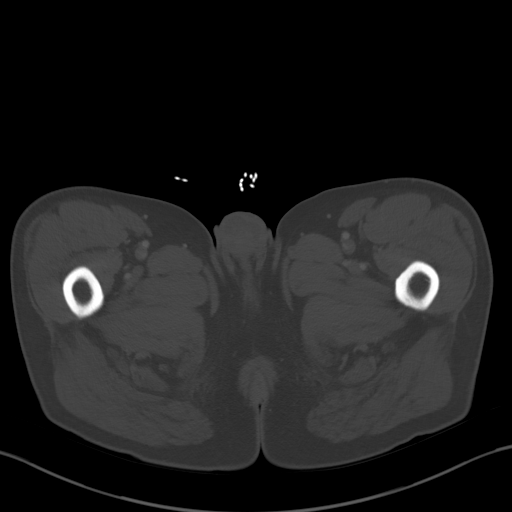
[im 14/95  soft-tissue]
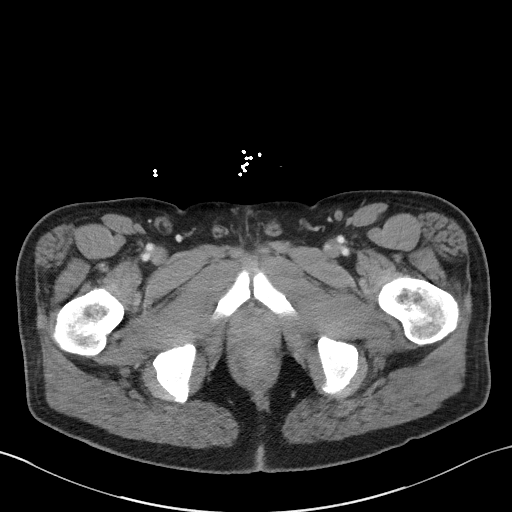
[im 23/95  soft-tissue]
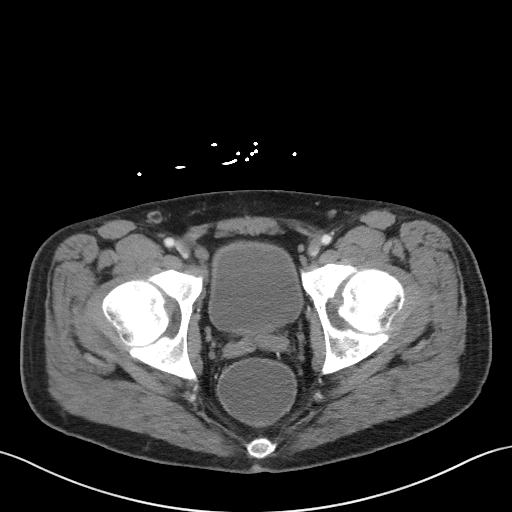
[im 27/95  soft-tissue]
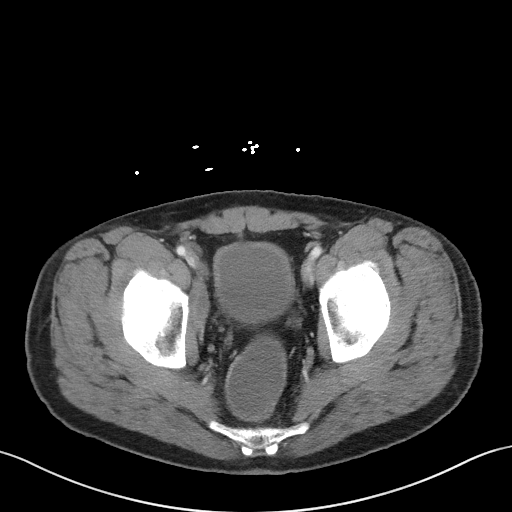
[im 36/95  soft-tissue]
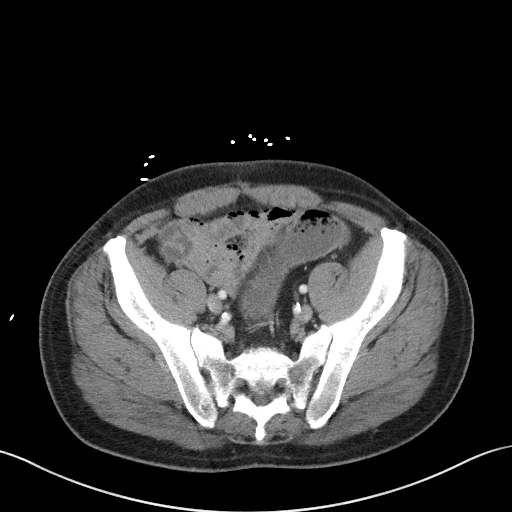
[im 45/95  soft-tissue]
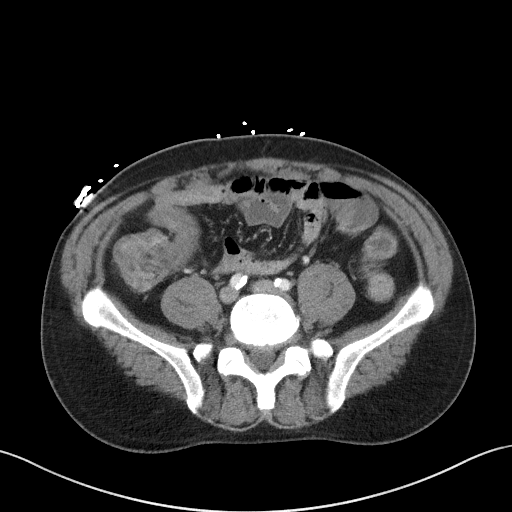
[im 50/95  soft-tissue]
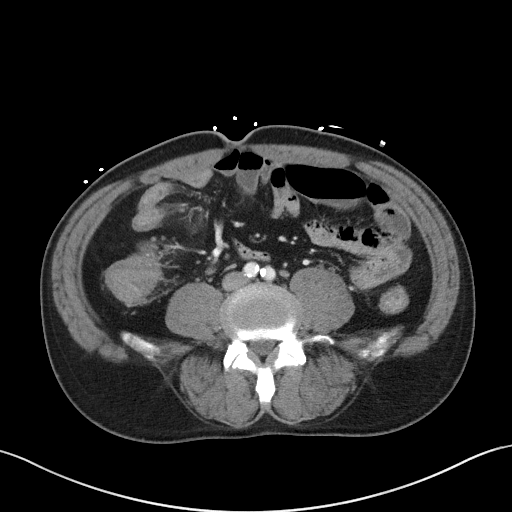
[im 59/95  soft-tissue]
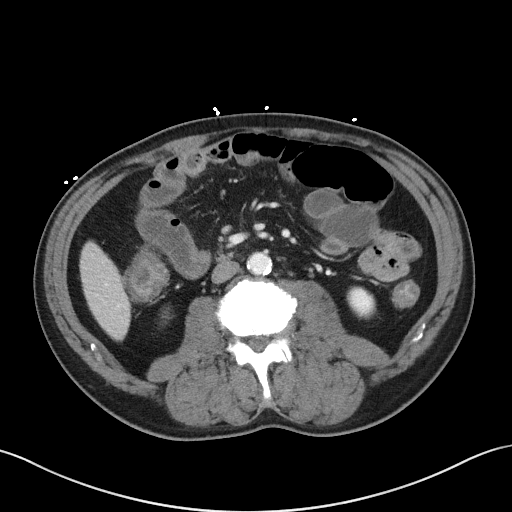
[im 68/95  soft-tissue]
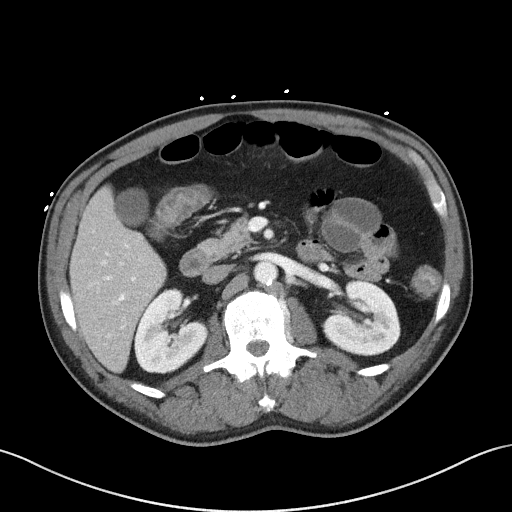
[im 68/95  bone]
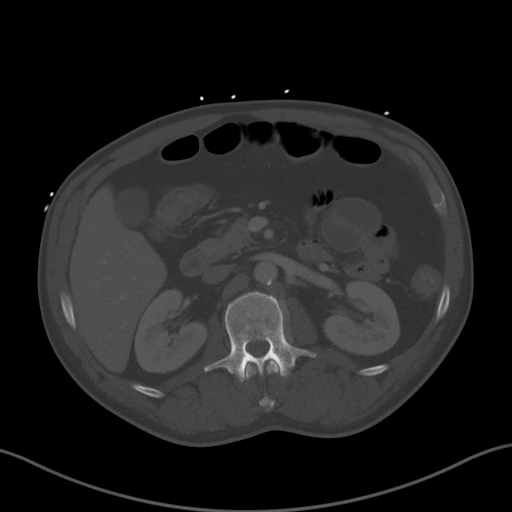
[im 72/95  soft-tissue]
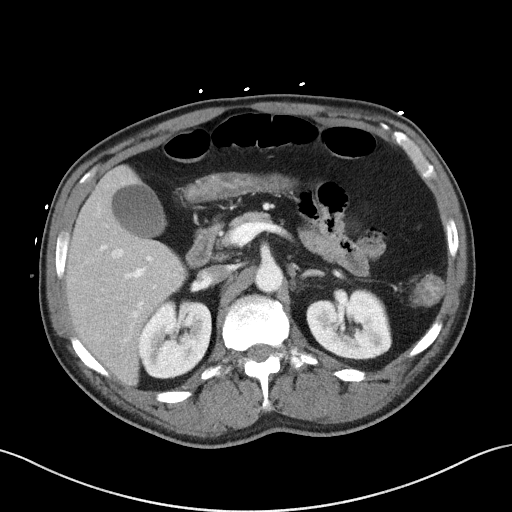
[im 81/95  soft-tissue]
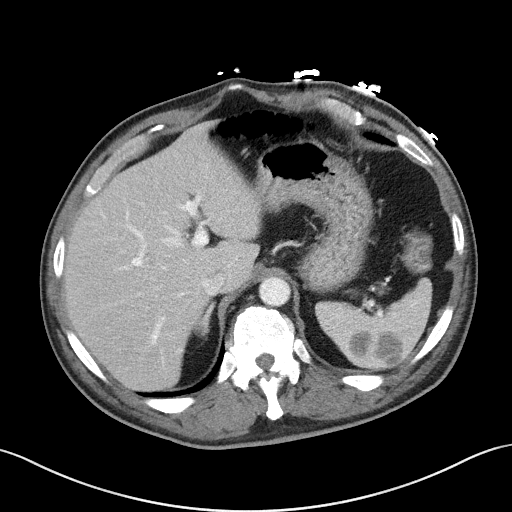
[im 90/95  soft-tissue]
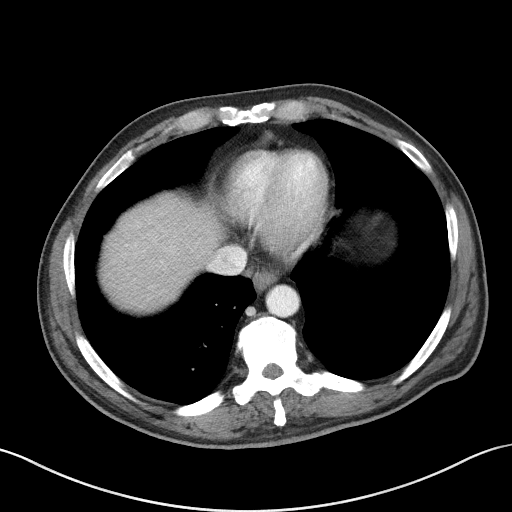

[Series 5: coronal st · coronal · 0.69mm/px · 3 of 103 slices shown]
[im 35/103  soft-tissue]
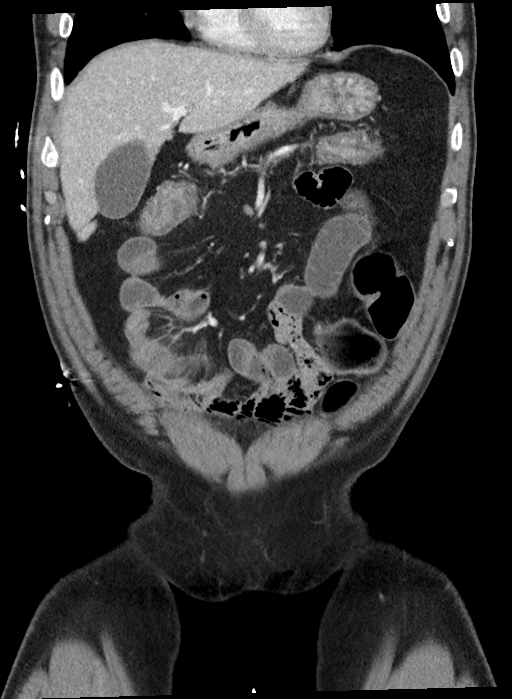
[im 46/103  soft-tissue]
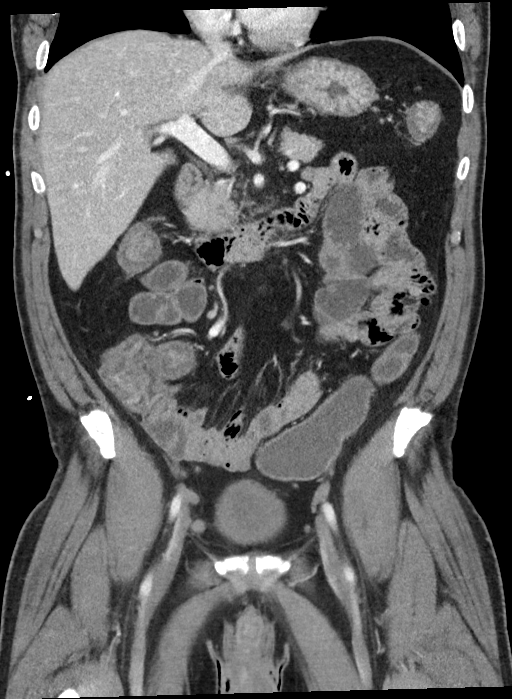
[im 57/103  soft-tissue]
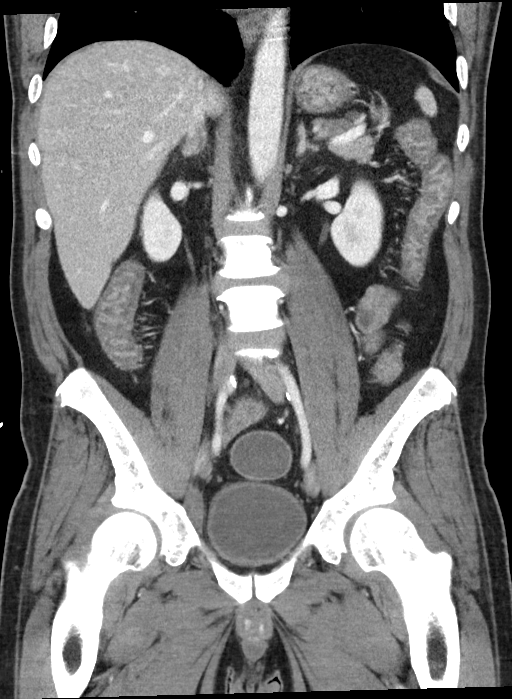

[15 of 46 positions shown; findings below may reference images not displayed]

FINDINGS: Lower chest: No acute abnormality.

Hepatobiliary: No focal liver abnormality is seen. Gallbladder is
unremarkable. No bile duct dilatation.

Pancreas: Unremarkable. No pancreatic ductal dilatation or
surrounding inflammatory changes.

Spleen: Multiple hypodense masses within the spleen, largest
measuring 2.8 cm, FDG avid on recent PET-CT indicating
lymphoproliferative disorder and presumably related to the recent
diagnosis of lymphoma.

Adrenals/Urinary Tract: Adrenal glands appear normal. Kidneys are
unremarkable without suspicious mass, stone or hydronephrosis. No
perinephric fluid. No ureteral or bladder calculi are identified.
Bladder is unremarkable, partially decompressed.

Stomach/Bowel: Thickening of the walls of the majority of the colon.
Additional thickening and enhancement of the walls of multiple
segments of small bowel, most prominent within the jejunum and
ileum. Fluid is seen throughout the small bowel, with associated
air-fluid levels.

Stomach is unremarkable.  Appendix is normal in caliber.

Vascular/Lymphatic: Aortic atherosclerosis. No acute appearing
vascular abnormality.

The intra-abdominal intrapelvic lymphadenopathy demonstrated on
recent PET-CT has significantly improved in the interval.
Representative lymphadenopathy at the RIGHT pelvic sidewall
previously measured 3.5 cm greatest thickness, now measuring 1.6 cm
greatest thickness. Lymph node within the LEFT periaortic
retroperitoneum previously measured 1.3 cm short axis dimension, now
measuring 0.7 cm short axis.

Reproductive: Prostate is unremarkable.

Other: No free fluid or abscess collection is seen. No free
intraperitoneal air.

Musculoskeletal: No acute appearing osseous abnormality.
IMPRESSION: 1. Significant thickening and enhancement of the walls of the
majority of the colon and multiple segments of small bowel, most
prominent within the jejunum and ileum. Differential includes
diffuse enterocolitis of infectious or inflammatory nature,
typhlitis, radiation induced enterocolitis (if corresponding history
of radiation) and graft-versus-host disease (if any corresponding
history of bone marrow transplant).
2. The intra-abdominal intrapelvic lymphadenopathy demonstrated on
recent PET-CT has significantly improved in the interval indicating
a good response to interval treatment. Measurements provided above.
3. Multiple hypodense masses within the spleen, largest measuring
2.8 cm, FDG avid on recent PET-CT indicating lymphoproliferative
disorder and also presumably related to the recent diagnosis of
lymphoma.

Aortic Atherosclerosis ([H6]-[H6]).

## 2020-07-12 MED ORDER — METRONIDAZOLE 500 MG/100ML IV SOLN
500.0000 mg | Freq: Three times a day (TID) | INTRAVENOUS | Status: DC
  Administered 2020-07-12 – 2020-07-16 (×11): 500 mg via INTRAVENOUS
  Filled 2020-07-12 (×14): qty 100

## 2020-07-12 MED ORDER — LACTATED RINGERS IV SOLN: INTRAVENOUS | Status: DC

## 2020-07-12 MED ORDER — FENTANYL CITRATE (PF) 100 MCG/2ML IJ SOLN
25.0000 ug | INTRAMUSCULAR | Status: DC | PRN
  Administered 2020-07-12 – 2020-07-13 (×4): 25 ug via INTRAVENOUS
  Filled 2020-07-12 (×4): qty 2

## 2020-07-12 MED ORDER — ACETAMINOPHEN 325 MG PO TABS: 650.0000 mg | ORAL_TABLET | Freq: Four times a day (QID) | ORAL | Status: DC | PRN

## 2020-07-12 MED ORDER — METRONIDAZOLE 500 MG/100ML IV SOLN
500.0000 mg | Freq: Once | INTRAVENOUS | Status: AC
  Administered 2020-07-12: 500 mg via INTRAVENOUS
  Filled 2020-07-12: qty 100

## 2020-07-12 MED ORDER — SODIUM CHLORIDE 0.9 % IV BOLUS
1000.0000 mL | Freq: Once | INTRAVENOUS | Status: AC
  Administered 2020-07-12: 1000 mL via INTRAVENOUS

## 2020-07-12 MED ORDER — SODIUM CHLORIDE 0.9 % IV BOLUS
500.0000 mL | Freq: Once | INTRAVENOUS | Status: AC
  Administered 2020-07-12: 500 mL via INTRAVENOUS

## 2020-07-12 MED ORDER — ONDANSETRON HCL 4 MG/2ML IJ SOLN: 4.0000 mg | Freq: Four times a day (QID) | INTRAMUSCULAR | Status: DC | PRN

## 2020-07-12 MED ORDER — SODIUM CHLORIDE 0.9 % IV SOLN
2.0000 g | Freq: Once | INTRAVENOUS | Status: AC
  Administered 2020-07-12: 2 g via INTRAVENOUS
  Filled 2020-07-12: qty 2

## 2020-07-12 MED ORDER — VANCOMYCIN HCL IN DEXTROSE 1-5 GM/200ML-% IV SOLN
1000.0000 mg | Freq: Once | INTRAVENOUS | Status: AC
  Administered 2020-07-12: 1000 mg via INTRAVENOUS
  Filled 2020-07-12: qty 200

## 2020-07-12 MED ORDER — SODIUM CHLORIDE 0.9 % IV SOLN
2.0000 g | Freq: Three times a day (TID) | INTRAVENOUS | Status: DC
  Administered 2020-07-12 – 2020-07-16 (×12): 2 g via INTRAVENOUS
  Filled 2020-07-12 (×15): qty 2

## 2020-07-12 MED ORDER — ACETAMINOPHEN 650 MG RE SUPP: 650.0000 mg | Freq: Four times a day (QID) | RECTAL | Status: DC | PRN

## 2020-07-12 MED ORDER — IOHEXOL 300 MG/ML  SOLN
100.0000 mL | Freq: Once | INTRAMUSCULAR | Status: AC | PRN
  Administered 2020-07-12: 100 mL via INTRAVENOUS

## 2020-07-12 MED ORDER — ACETAMINOPHEN 500 MG PO TABS
1000.0000 mg | ORAL_TABLET | Freq: Once | ORAL | Status: AC
  Administered 2020-07-12: 1000 mg via ORAL
  Filled 2020-07-12: qty 2

## 2020-07-12 NOTE — ED Notes (Signed)
Pt states he has felt weak for two days since last cancer treatment on Thursday, pt has not ate or drank anything since Friday morning; eyes are slight yellow; temp 99.64F

## 2020-07-12 NOTE — Consult Note (Signed)
PHARMACY -  BRIEF ANTIBIOTIC NOTE   Pharmacy has received consult(s) for vancomycin and cefepime from an ED provider. Patient is also ordered metronidazole.  The patient's profile has been reviewed for ht/wt/allergies/indication/available labs.    One time order(s) placed for --Cefepime 2 g IV  --Vancomycin 1 g IV  Further antibiotics/pharmacy consults should be ordered by admitting physician if indicated.                       Thank you, Benita Gutter 07/12/2020  9:34 AM

## 2020-07-12 NOTE — Telephone Encounter (Signed)
Pt reports not feeling good today. Very weak.  Temperature was 94.7.  No chills, nausea vomiting. Multiple episodes of diarrhea.   Advise patient to go to ER for evaluation.

## 2020-07-12 NOTE — H&P (Signed)
History and Physical    PLEASE NOTE THAT DRAGON DICTATION SOFTWARE WAS USED IN THE CONSTRUCTION OF THIS NOTE.   Christopher Mills OIB:[id number] DOB: [birth year] DOA: 07/12/2020  PCP: [name], MD Patient coming from: home   I have personally briefly reviewed patient's old medical records in [place]  Chief Complaint: Generalized weakness  HPI: Christopher Mills is a 60 y.o. male with medical history significant for high-grade B-cell lymphoma undergoing chemotherapy, hypertension, who is admitted to [facility] on 07/12/2020 with neutropenic fever after presenting from home to [facility] ED complaining of generalized weakness.   The patient reports 2 days of progressive generalized weakness in the absence of any acute focal weakness, acute numbness or paresthesias, dysarthria, facial droop, acute change in vision, vertigo, dysphagia.  He reports that this has been associated with 2 days of watery, nonbloody diarrhea, noting 3-4 total episodes of loose stool at that time, with most recent episode occurring just prior to presenting to the [facility] ED today.  He specifically denies any associated melena or hematochezia.  He notes mild associated nausea in the absence of any vomiting.  He also reports associated abdominal pain, which he describes as sharp in nature, and most prominently located in the bilateral lower abdominal quadrants.  Reports that this has been constant over the last day, with exacerbation via palpation over the abdomen.  Is any radiation of the discomfort from the bilateral lower abdominal quadrants.  No preceding trauma or travel.  Denies any associated dysuria, gross hematuria, or change in urinary urgency/frequency.  No rash.  Over the last 2 days, he also notes decline in oral intake associated with decline in appetite over that time.  2 days ago, shortly after initial onset of diarrhea, the patient reports he presented to the outpatient infusion center, where he  received some IV fluids.  Ports that this measure transiently benefited from a generalized weakness standpoint, but notes subsequent and progressive worsening causing him to present to [facility] ED today for further evaluation.  Over the last 2 days he also notes subjective fever, with close monitoring of his temperature at home over that time revealing temperatures in the range of 94.5-97.5 F, although he notes that it has been several hours since he last checked his temperature at home by the time he arrived in the ED today.  Denies any associated chills, full body rigors, or generalized myalgias.  No recent headache, neck stiffness, rhinitis, rhinorrhea, sore throat. No known recent COVID-19 exposures.  No recent shortness of breath, cough, or wheezing.  He denies any recent new oral lesions.   He has a history of high-grade B-cell lymphoma and underwent excision of neck mass in March 2022.  He has also been on chemotherapy, reporting most recent chemo session 3 weeks ago.  He follows with Dr. Janese Banks as his outpatient oncologist, that he is next scheduled for chemotherapy on May 16.  He has a right-sided Port-A-Cath.       ED Course:  Vital signs in the ED were notable for the following: Temperature max 100.7, initial heart rate 130, which decreased to 103 following interval IV fluids, as detailed below; blood pressure 100/72 -118/78; respiratory rate 15-22, oxygen saturation 98 to 100% on room air.  Labs were notable for the following: CMP was notable for the following: Sodium 128 compared to 134 on 07/10/2020 and 137 on 07/07/2020, chloride 96, bicarbonate 21, anion gap 11, BUN 26, creatinine 1.35 relative to 1.07 on 07/10/2020  and 0.95 on 07/07/2020.  CBC notable for 800 white blood cells, with 46% neutrophils and associated absolute neutrophil count of 400.  Platelets 85 compared to 79 on 07/10/2020, INR 1.1.  Initial lactic acid 1.5, with repeat value trending down to 1.2.  Urinalysis showed no white blood  cells, no bacteria, nitrate negative, leukocyte Estrace negative, and was associated with specific gravity of 1.031.  Stool sample sent for both C. difficile PCR as well as infectious stool panel by PCR.  Blood cultures x2 were prior to initiation of any antibiotics.  Nasopharyngeal COVID-19/influenza PCR were checked in the ED today and found to be negative.  EKG shows sinus tachycardia with heart rate 124, normal intervals, nonspecific T wave inversion in aVL, and no evidence of ST changes, including no evidence of ST elevation.  Chest x-ray showed no evidence of acute cardiopulmonary process, including no evidence of infiltrate.  CT abdomen/pelvis with contrast showed significant thickening and enhancement of the walls of the majority of the colon in multiple segments of the small bowel, most prominent within the jejunum and ileum, with differential including diffuse enterocolitis of infectious or inflammatory nature versus typhlitis in the absence of any evidence of additional acute intra-abdominal process, including no evidence of bowel abscess, perforation, or obstruction.  While in the ED, the following were administered: Tylenol 1 g p.o. x1, cefepime 2 g IV x1, Flagyl 500 mg IV x1, IV vancomycin x1 dose, and a 1.5 L normal saline bolus.  Subsequently, the patient was admitted to the med telemetry floor for further evaluation and management of his neutropenic fever.     Review of Systems: As per HPI otherwise 10 point review of systems negative.   Past Medical History:  Diagnosis Date  . Dyspnea   . Hypertension   . Lymphoma of lymph nodes of neck ([place]) 06/18/2020    Past Surgical History:  Procedure Laterality Date  . BONE MARROW BIOPSY    . COLONOSCOPY    . EXCISION MASS NECK Right 06/12/2020   Procedure: EXCISION MASS NECK;  Surgeon: Jules Husbands, MD;  Location: [facility] ORS;  Service: General;  Laterality: Right;  . HERNIA REPAIR Right    at age 25-RIH  . PORTA CATH INSERTION    .  PORTACATH PLACEMENT Right 06/24/2020   Procedure: INSERTION PORT-A-CATH;  Surgeon: Jules Husbands, MD;  Location: [facility] ORS;  Service: General;  Laterality: Right;    Social History:  reports that he quit smoking about 4 weeks ago. His smoking use included cigarettes. He has never used smokeless tobacco. He reports previous alcohol use. He reports previous drug use. Drug: Marijuana.   No Known Allergies  Family History  Problem Relation Age of Onset  . Anemia Mother   . Hypertension Mother   . Goiter Mother   . Cancer Father   . Cancer Sister   . Multiple sclerosis Sister      Prior to Admission medications   Medication Sig Start Date End Date Taking? Authorizing Provider  atenolol (TENORMIN) 25 MG tablet Take 25 mg by mouth daily. 04/12/20  Yes [provider]  HYDROcodone-acetaminophen (NORCO/VICODIN) 5-325 MG tablet Take 1 tablet by mouth every 6 (six) hours as needed for moderate pain. 06/16/20  Yes Pabon, Diego F, MD  lisinopril (ZESTRIL) 10 MG tablet Take 10 mg by mouth daily. 04/12/20  Yes [provider]  predniSONE (DELTASONE) 50 MG tablet Take 2 tablets each day with food early in am for 5 days while on  chemo treatment 07/01/20  Yes Sindy Guadeloupe, MD  VITAMIN D PO Take 1 capsule by mouth daily.   Yes [provider]  aspirin EC 81 MG tablet Take 81 mg by mouth daily as needed (heart health). Swallow whole. Patient not taking: Reported on 07/12/2020    [provider]  Tenofovir Alafenamide Fumarate 25 MG TABS Take 1 tablet (25 mg total) by mouth daily. Patient not taking: No sig reported 07/01/20   Sindy Guadeloupe, MD  allopurinol (ZYLOPRIM) 300 MG tablet Take 1 tablet (300 mg total) by mouth daily. 07/01/20 07/10/20  Sindy Guadeloupe, MD  prochlorperazine (COMPAZINE) 10 MG tablet Take 1 tablet (10 mg total) by mouth every 6 (six) hours as needed (Nausea or vomiting). Patient not taking: No sig reported 07/01/20 07/10/20  Sindy Guadeloupe, MD      Objective    Physical Exam: Vitals:   07/12/20 1030 07/12/20 1034 07/12/20 1114 07/12/20 1130  BP: 115/70 115/70 111/63 100/72  Pulse: 99 (!) 103 (!) 112 99  Resp: '19 15 17 11  ' Temp:   98.9 F (37.2 C)   TempSrc:      SpO2: 99% 100% 99% 98%  Weight:      Height:        General: appears to be stated age; alert, oriented Skin: warm, dry, no rash Head:  AT/[place] Mouth:  Oral mucosa membranes appear dry, normal dentition Neck: supple; trachea midline Heart: Mildly tachycardic, but regular; did not appreciate any M/R/G Lungs: CTAB, did not appreciate any wheezes, rales, or rhonchi Abdomen: + BS; soft, ND; mild tenderness to palpation over the bilateral lower abdominal quadrants predominantly; not associate with any guarding, rigidity, or rebound tenderness.  No evidence of peritoneal signs at this time. Vascular: 2+ pedal pulses b/l; 2+ radial pulses b/l Extremities: no peripheral edema, no muscle wasting Neuro: strength and sensation intact in upper and lower extremities b/l     Labs on Admission: I have personally reviewed following labs and imaging studies  CBC: Recent Labs  Lab 07/07/20 0758 07/10/20 0814 07/12/20 0911  WBC 23.8* 1.1* 0.8*  NEUTROABS 16.0* 0.2* 0.4*  HGB 12.6* 13.4 12.5*  HCT 36.7* 39.3 35.9*  MCV 86.6 85.4 84.3  PLT 98* 79* 85*   Basic Metabolic Panel: Recent Labs  Lab 07/07/20 0758 07/10/20 0814 07/12/20 0911  NA 137 134* 128*  K 3.6 3.7 4.1  CL 101 99 96*  CO2 25 26 21*  GLUCOSE 90 105* 120*  BUN 17 24* 26*  CREATININE 0.95 1.07 1.35*  CALCIUM 9.3 9.4 9.4  MG  --   --  2.1   GFR: Estimated Creatinine Clearance: 65.3 mL/min (A) (by C-G formula based on SCr of 1.35 mg/dL (H)). Liver Function Tests: Recent Labs  Lab 07/07/20 0758 07/10/20 0814 07/12/20 0911  AST 23 27 14*  ALT 24 37 25  ALKPHOS 84 88 73  BILITOT 0.9 0.9 1.3*  PROT 6.9 6.9 7.2  ALBUMIN 3.8 3.9 3.6   No results for input(s): LIPASE, AMYLASE in the last  168 hours. No results for input(s): AMMONIA in the last 168 hours. Coagulation Profile: Recent Labs  Lab 07/12/20 0911  INR 1.1   Cardiac Enzymes: No results for input(s): CKTOTAL, CKMB, CKMBINDEX, TROPONINI in the last 168 hours. BNP (last 3 results) No results for input(s): PROBNP in the last 8760 hours. HbA1C: No results for input(s): HGBA1C in the last 72 hours. CBG: No results for input(s): GLUCAP in the last  168 hours. Lipid Profile: No results for input(s): CHOL, HDL, LDLCALC, TRIG, CHOLHDL, LDLDIRECT in the last 72 hours. Thyroid Function Tests: No results for input(s): TSH, T4TOTAL, FREET4, T3FREE, THYROIDAB in the last 72 hours. Anemia Panel: No results for input(s): VITAMINB12, FOLATE, FERRITIN, TIBC, IRON, RETICCTPCT in the last 72 hours. Urine analysis:    Component Value Date/Time   COLORURINE YELLOW (A) 07/12/2020 0940   APPEARANCEUR CLEAR (A) 07/12/2020 0940   LABSPEC 1.031 (H) 07/12/2020 0940   PHURINE 6.0 07/12/2020 0940   GLUCOSEU NEGATIVE 07/12/2020 0940   HGBUR NEGATIVE 07/12/2020 0940   BILIRUBINUR NEGATIVE 07/12/2020 0940   KETONESUR NEGATIVE 07/12/2020 0940   PROTEINUR NEGATIVE 07/12/2020 0940   NITRITE NEGATIVE 07/12/2020 0940   LEUKOCYTESUR NEGATIVE 07/12/2020 0940    Radiological Exams on Admission: DG Chest 2 View  Result Date: 07/12/2020 CLINICAL DATA:  60 year old male with weakness. Currently undergoing treatment for lymphoma. EXAM: CHEST - 2 VIEW COMPARISON:  06/24/2020 and prior chest radiographs FINDINGS: The cardiomediastinal silhouette is unchanged. Slightly prominent SUPERIOR mediastinum again noted. A RIGHT IJ Port-A-Cath is noted with tip overlying the mid SVC. There is no evidence of focal airspace disease, pulmonary edema, suspicious pulmonary nodule/mass, pleural effusion, or pneumothorax. No acute bony abnormalities are identified. IMPRESSION: No acute cardiopulmonary disease. Electronically Signed   By: Margarette Canada M.D.   On:  07/12/2020 09:57   CT ABDOMEN PELVIS W CONTRAST  Result Date: 07/12/2020 CLINICAL DATA:  Abdominal pain and fever.  Nausea and diarrhea. EXAM: CT ABDOMEN AND PELVIS WITH CONTRAST TECHNIQUE: Multidetector CT imaging of the abdomen and pelvis was performed using the standard protocol following bolus administration of intravenous contrast. CONTRAST:  181m OMNIPAQUE IOHEXOL 300 MG/ML  SOLN COMPARISON:  PET CT dated 05/26/2020. FINDINGS: Lower chest: No acute abnormality. Hepatobiliary: No focal liver abnormality is seen. Gallbladder is unremarkable. No bile duct dilatation. Pancreas: Unremarkable. No pancreatic ductal dilatation or surrounding inflammatory changes. Spleen: Multiple hypodense masses within the spleen, largest measuring 2.8 cm, FDG avid on recent PET-CT indicating lymphoproliferative disorder and presumably related to the recent diagnosis of lymphoma. Adrenals/Urinary Tract: Adrenal glands appear normal. Kidneys are unremarkable without suspicious mass, stone or hydronephrosis. No perinephric fluid. No ureteral or bladder calculi are identified. Bladder is unremarkable, partially decompressed. Stomach/Bowel: Thickening of the walls of the majority of the colon. Additional thickening and enhancement of the walls of multiple segments of small bowel, most prominent within the jejunum and ileum. Fluid is seen throughout the small bowel, with associated air-fluid levels. Stomach is unremarkable.  Appendix is normal in caliber. Vascular/Lymphatic: Aortic atherosclerosis. No acute appearing vascular abnormality. The intra-abdominal intrapelvic lymphadenopathy demonstrated on recent PET-CT has significantly improved in the interval. Representative lymphadenopathy at the RIGHT pelvic sidewall previously measured 3.5 cm greatest thickness, now measuring 1.6 cm greatest thickness. Lymph node within the LEFT periaortic retroperitoneum previously measured 1.3 cm short axis dimension, now measuring 0.7 cm short  axis. Reproductive: Prostate is unremarkable. Other: No free fluid or abscess collection is seen. No free intraperitoneal air. Musculoskeletal: No acute appearing osseous abnormality. IMPRESSION: 1. Significant thickening and enhancement of the walls of the majority of the colon and multiple segments of small bowel, most prominent within the jejunum and ileum. Differential includes diffuse enterocolitis of infectious or inflammatory nature, typhlitis, radiation induced enterocolitis (if corresponding history of radiation) and graft-versus-host disease (if any corresponding history of bone marrow transplant). 2. The intra-abdominal intrapelvic lymphadenopathy demonstrated on recent PET-CT has significantly improved in the interval indicating a good  response to interval treatment. Measurements provided above. 3. Multiple hypodense masses within the spleen, largest measuring 2.8 cm, FDG avid on recent PET-CT indicating lymphoproliferative disorder and also presumably related to the recent diagnosis of lymphoma. Aortic Atherosclerosis (ICD10-I70.0). Electronically Signed   By: Franki Cabot M.D.   On: 07/12/2020 12:32     EKG: Independently reviewed, with result as described above.    Assessment/Plan   Christopher Mills is a 60 y.o. male with medical history significant for high-grade B-cell lymphoma undergoing chemotherapy, hypertension, who is admitted to [facility] on 07/12/2020 with neutropenic fever after presenting from home to [facility] ED complaining of generalized weakness.    Principal Problem:   Neutropenic fever (HCC) Active Problems:   Diarrhea   Enterocolitis   Acute hyponatremia   Abdominal pain   Acute kidney injury ([place])   Thrombocytopenia (HCC)   Generalized weakness   Dehydration   Hypertension     #) Neutropenic fever: In the context of presenting objective temperature of 100.7, with presenting CBC reflecting an absolute neutrophil count 400.  This is in the  context of an underlying history of high-grade B cell lymphoma for which the patient has been undergoing chemotherapy, with most recent infusion occurring 3 weeks ago per the patient.  While an underlying infectious source has not been confirmed, potential infectious etiologies at this time include infectious enterocolitis versus typhlitis in the setting of wall thickening at the majority of the colon and multiple segments of small bowel, as further detailed above, in the setting of the patient's report of 2 days of nonbloody diarrhea and abdominal pain.  In terms of additional infectious work-up, no evidence of urinary tract infection per presenting urinalysis, nasopharyngeal COVID-19/influenza PCR performed in the ED today were found to be negative.  While chest x-ray showed no evidence of acute cardiopulmonary process, including no evidence of pneumonia.  No meningismus symptoms to suggest meningitis at this time.  No evidence of cellulitis, nor oral lesions.  SIRS criteria are met sepsis, as further described below.  Blood cultures x2 collected in the ED prior to initiation of broad-spectrum antibiotics.  We will continue neutropenic fever dosing of cefepime as Christopher Mills antipseudomonal cephalosporin.  We will also continue IV Flagyl for anaerobic coverage given potential infectious source involving enterocolitis versus typhlitis.  No indication for continuation of IV vancomycin or antifungal coverage at this time.   Plan: Cefepime 2 g IV every 8 hours.  Continue IV Flagyl, as above.  Closely monitor for development of subsequent fever.  As needed acetaminophen for development of fever.  Monitor for results of blood cultures x2.  CBC with differential in the morning, with close attention to ensuing absolute neutrophil count.  Neutropenic precautions.  No DRE's.  Additional evaluation and management of colonic and small bowel wall thickening, as further described below.  IV fluids, as detailed  below.        #) Colon and small bowel wall thickening: In the setting of presenting abdominal discomfort as well as 2 days of nonbloody diarrhea, presenting CT abdomen/pelvis showed significant thickening and enhancement of the walls of the majority of the colon and multiple segments of the small bowel, most prominent within the jejunum and ileum with differential including diffuse enterocolitis of infectious or inflammatory nature versus typhlitis.  Of note, the patient's abdomen is mildly tender at this time, with tenderness mostly limited to the bilateral lower abdominal quadrants in the absence of any guarding, rigidity, or rebound tenderness.  No evidence of associated distention and no peritoneal signs at this time.  Given that current differential includes typhlitis, will closely monitor ensuing trend of patient's abdominal exam.  As such, I have ordered serial every 4 hours abdominal exams with instructions to contact the primary team if intensity or distribution of associated abdominal exam subsequently worsens, or certainly if there is development of any peritoneal signs.  For now, the patient's bowel wall thickening appears mild, warranting conservative medical management, including n.p.o. for bowel rest, IV fluids, IV antibiotics, with close clinical monitoring.  CT abdomen/pelvis showed no evidence of bowel abscess, perforation, or obstruction.  C. difficile PCR and infectious stool panel have been ordered, with results currently pending.   Plan: Lactated Ringer's at 100 cc/h.  NPO.  Follow-up for results of blood cultures x2 as well as stool studies, as above.  Continue cefepime and IV Flagyl, as above.  As needed IV fentanyl.  Repeat CMP and CBC in the morning.  Nursing communication order requesting every 4 hours serial abdominal exams, as described above.      #) Severe sepsis: In the setting of presenting neutropenic fever, SIRS criteria met via presenting fever and tachycardia.   Patient sepsis meets criteria to be considered severe in nature on the basis of concomitant endorgan damage in the form of presenting acute kidney injury, as further described below.  Of note, presenting lactic acid level found to be 1.5, with subsequent trend down to 1.2 no evidence of associated hypotension thus far.  Therefore, in the absence of an elevated lactate of greater than or equal to 4.0 and in the absence of hypotension, there is currently no indication for administration of 30 mL/kg IVF bolus.  At this time, we are further evaluating for underlying gastrointestinal source of infection, as further described above.  No other source of underlying infectious process identified at this time, as further outlined above.  Plan: IV fluids, as above.  Continue neutropenic fever dosing of cefepime, and continue IV Flagyl for anaerobic coverage.  Monitor for results of blood cultures x2.  Monitor for results of stool studies.  Repeat CBC with differential in the morning.       #) Acute hypo-osmolar hyponatremia: Presenting serum sodium found to be 128, which has been trending down over the last few days relative to 134 on 07/10/2020 and 137 on 07/07/2020.  Suspect at least a partial hypovolemic contribution from gastrointestinal losses in the setting of patient's report of 2 days of diarrhea with limited oral intake over that time.  Given his presenting generalized weakness, will also check TSH, and evaluate for any contribution from SIADH, as further outlined with laboratory studies below.  Will provide gentle IV fluids to attempt to contribution from dehydration, will expanding evaluation, as further detailed below.    Plan: monitor strict I's and O's and daily weights.  Check random urine sodium, urine osmolality.  Check serum osmolality to confirm suspected hypoosmolar etiology.  Repeat CMP in the morning.  Check TSH.       #) Acute kidney injury: Presenting creatinine found to be 1.35  relative to 1.07 on 07/10/2020 and 0.95 on 07/07/2020.  Suspect prerenal contribution in the setting of dehydration due to 2 days of increased gastrointestinal losses in the form of diarrhea over that time.  Further support for dehydration stems from presenting urinalysis which was notable for an elevated specific gravity.  Additionally, physical exam suggestive of underlying dehydration, with presenting dry oral mucous membranes.  Potential pharmacologic  exacerbating factor in the form of home lisinopril.   Plan: Add on random urine sodium as well as random urine creatinine.  Monitor strict I's and O's and daily weights.  Attempt to avoid nephrotoxic agents.  Hold home lisinopril.  Lactated Ringer's at 100 cc/h overnight.  Repeat CMP in the morning.  Add on serum magnesium level.       #) Thrombocytopenia: Presenting CBC notable for platelet count of 85, which represents a slight trend up from most recent prior value of 79 when checked 2 days prior on 07/10/2020.  Suspect that this is a consequence of the patient's chemotherapy.  Differential also includes possibility of reverse acute phase reactant in the setting of presenting severe sepsis.  No evidence of active bleed.  INR found to be 1.1.  He is on a daily baby aspirin as an outpatient.  Plan: Repeat CBC with differential in the morning.  Additional work-up and management presenting neutropenic fever and associated severe sepsis, as further described above.         #) Generalized weakness: The patient reports 2 to 3 days of generalized weakness in the absence of any acute focal weakness or any other acute focal neurologic deficits.  Acute ischemic CVA is felt to be less likely at this time.  Suspect that patient's generalized weakness is multifactorial in etiology, with contributions from physiologic stress stemming from underlying high-grade B-cell lymphoma as well as chemotherapy that he is undergoing for treatment thereof in addition to  contributions from dehydration, hyponatremia, and physiologic stress from severe sepsis in the context of presenting neutropenic fever.  Plan: Work-up and management of severe sepsis in the context of neutropenic fever, including broad-spectrum antibiotics to include antipseudomonal cephalosporin as well as Flagyl, as above.  Additionally, continue IV fluids, as above.  Physical therapy consult has been placed for the morning.  Check TSH.  Repeat CMP and CBC in the morning.  Check MMA level, ionized calcium level.      #) Essential hypertension: Outpatient antihypertensive regimen consists of lisinopril and atenolol.  Systolic blood pressures in the emergency department have been in the low 100s up to 120 mmHg, without any evidence of hypotension.  The patient is dehydrated at presentation, as further detailed above, with further support for the suspicion stemming from improving tachycardia with interval IV fluids.  As the patient does not yet appear to be euvolemic, and given that presentation is complicated by severe sepsis in the setting of neutropenic fever, will hold home atenolol for now.  Additionally, in the setting of presenting acute kidney injury, will hold home lisinopril for now.  Plan: Hold home atenolol and lisinopril for now, as above.  Close monitoring of ensuing blood pressures via routine vital signs.        DVT prophylaxis: SCDs Code Status: Full code Family Communication: none Disposition Plan: Per Rounding Team Consults called: none  Admission status: Inpatient; med telemetry     Of note, this patient was added by me to the following Admit List/Treatment Team: [facility].      PLEASE NOTE THAT DRAGON DICTATION SOFTWARE WAS USED IN THE CONSTRUCTION OF THIS NOTE.   [facility] Pager [phone] From 12PM - 12AM  Otherwise, please contact night-coverage  [email or web address] Password [email or web address]   07/12/2020, 1:48 PM

## 2020-07-12 NOTE — Sepsis Progress Note (Signed)
elink is following code sepsis 

## 2020-07-12 NOTE — Consult Note (Signed)
Pharmacy Antibiotic Note  Christopher Mills is a 60 y.o. male with medical history including lymphoma on chemotherapy admitted on 07/12/2020 with febrile neutropenia. ANC 400 on admission. Pharmacy has been consulted for cefepime dosing. Patient is also ordered metronidazole.  Plan: Cefepime 2 g IV q8h  Height: 6\' 1"  (185.4 cm) Weight: 79.4 kg (175 lb) IBW/kg (Calculated) : 79.9  Temp (24hrs), Avg:99.5 F (37.5 C), Min:98.9 F (37.2 C), Max:100.7 F (38.2 C)  Recent Labs  Lab 07/07/20 0758 07/10/20 0814 07/12/20 0911 07/12/20 0940 07/12/20 1113  WBC 23.8* 1.1* 0.8*  --   --   CREATININE 0.95 1.07 1.35*  --   --   LATICACIDVEN  --   --   --  1.5 1.2    Estimated Creatinine Clearance: 65.3 mL/min (A) (by C-G formula based on SCr of 1.35 mg/dL (H)).    No Known Allergies  Antimicrobials this admission: Vancomycin 4/30 x 1 Cefepime 4/30 >>  Metronidazole 4/30 >>   Dose adjustments this admission: N/A  Microbiology results: 4/30 BCx: pending 4/30 GI panel: pending 4/30 C diff PCR: pending  Thank you for allowing pharmacy to be a part of this patient's care.  Benita Gutter 07/12/2020 1:18 PM

## 2020-07-12 NOTE — ED Provider Notes (Signed)
[facility] Emergency Department Provider Note  ____________________________________________   Event Date/Time   First MD Initiated Contact with Patient 07/12/20 [phone]     (approximate)  I have reviewed the triage vital signs and the nursing notes.   HISTORY  Chief Complaint Weakness    HPI Christopher Mills is a 60 y.o. male with lymphoma, with port who comes in for weakness.  Patient reports low-grade temperatures and weakness.  Patient is initially febrile and tachycardic in triage.  Patient gets chemo every 21 days.  Patient reports has not had chemotherapy for a few weeks.  However he was seen on 4/28 for some weakness thought to be secondary to his chemotherapy and patient reports that they thought he might be dehydrated he got some fluids.  Patient states that he is had multiple episodes of diarrhea associated with some lower abdominal pain that is intermittent, lower in nature, moderate, nothing makes it better but worse right before having to have diarrhea.  He reports a very mild cough may be some very mild shortness of breath as well.  Denies any chest pains, rashes.  Patient did not know he had a fever.          Past Medical History:  Diagnosis Date  . Dyspnea   . Hypertension   . 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 BONE MARROW BIOPSY    . COLONOSCOPY    . EXCISION MASS NECK Right 06/12/2020   Procedure: EXCISION MASS NECK;  Surgeon: Jules Husbands, MD;  Location: [facility] ORS;  Service: General;  Laterality: Right;  . HERNIA REPAIR Right    at age 61-RIH  . PORTA CATH INSERTION    . PORTACATH PLACEMENT Right 06/24/2020   Procedure: INSERTION PORT-A-CATH;  Surgeon: Jules Husbands, MD;  Location: [facility] ORS;  Service: General;  Laterality: Right;    Prior to Admission  medications   Medication Sig Start Date End Date Taking? Authorizing Provider  aspirin EC 81 MG tablet Take 81 mg by mouth daily as needed (heart health). Swallow whole.    [provider]  atenolol (TENORMIN) 25 MG tablet Take 25 mg by mouth daily. 04/12/20   [provider]  HYDROcodone-acetaminophen (NORCO/VICODIN) 5-325 MG tablet Take 1 tablet by mouth every 6 (six) hours as needed for moderate pain. Patient not taking: No sig reported 06/16/20   Pabon, [place] F, MD  lisinopril (ZESTRIL) 10 MG tablet Take 10 mg by mouth daily. 04/12/20   [provider]  predniSONE (DELTASONE) 50 MG tablet Take 2 tablets each day with food early in am for 5 days while on chemo treatment 07/01/20   Sindy Guadeloupe, MD  Tenofovir Alafenamide Fumarate 25 MG TABS Take 1 tablet (25 mg total) by mouth daily. Patient not taking: Reported on 07/10/2020 07/01/20   Sindy Guadeloupe, MD  VITAMIN D PO Take 1 capsule by mouth daily.    [provider]  allopurinol (ZYLOPRIM) 300 MG tablet Take 1 tablet (300 mg total) by mouth daily. 07/01/20 07/10/20  Sindy Guadeloupe, MD  prochlorperazine (COMPAZINE) 10 MG tablet Take 1 tablet (10 mg total) by mouth every 6 (six) hours as needed (Nausea or vomiting). Patient not taking: No sig reported 07/01/20 07/10/20  Sindy Guadeloupe, MD  Allergies Patient has no known allergies.  Family History  Problem Relation Age of Onset  . Anemia Mother   . Hypertension Mother   . Goiter Mother   . Cancer Father   . Cancer Sister   . Multiple sclerosis Sister     Social History Social History   Tobacco Use  . Smoking status: Former Smoker    Types: Cigarettes    Quit date: 06/13/2020    Years since quitting: 0.0  . Smokeless tobacco: Never Used  Vaping Use  . Vaping Use: Never used  Substance Use Topics  . Alcohol use: Not Currently  . Drug use: Not Currently    Types: Marijuana      Review of Systems Constitutional: No fever/chills, weakness   Eyes: No visual changes. ENT: No sore throat. Cardiovascular: Denies chest pain. Respiratory: Denies shortness of breath. Gastrointestinal: Positive abdominal pain, fever Genitourinary: Negative for dysuria. Musculoskeletal: Negative for back pain. Skin: Negative for rash. Neurological: Negative for headaches, focal weakness or numbness. All other ROS negative ____________________________________________   PHYSICAL EXAM:  VITAL SIGNS: ED Triage Vitals  Enc Vitals Group     BP 07/12/20 0901 107/90     Pulse Rate 07/12/20 0901 (!) 130     Resp 07/12/20 0901 (!) 22     Temp 07/12/20 0901 (!) 100.7 F (38.2 C)     Temp Source 07/12/20 0901 Oral     SpO2 07/12/20 0901 100 %     Weight 07/12/20 0903 175 lb (79.4 kg)     Height 07/12/20 0903 '6\' 1"'  (1.854 m)     Head Circumference --      Peak Flow --      Pain Score 07/12/20 0903 0     Pain Loc --      Pain Edu? --      Excl. in [place]? --     Constitutional: Alert and oriented. Well appearing and in no acute distress. Eyes: Conjunctivae are normal. EOMI. Head: Atraumatic. Nose: No congestion/rhinnorhea. Mouth/Throat: Mucous membranes are moist.   Neck: No stridor. Trachea Midline. FROM Cardiovascular: tachycardiac, regular rhythm. Grossly normal heart sounds.  Good peripheral circulation.  Right chest wall port Respiratory: Normal respiratory effort.  No retractions. Lungs CTAB. Gastrointestinal: Mild lower abdominal tenderness no distention. No abdominal bruits.  Musculoskeletal: No lower extremity tenderness nor edema.  No joint effusions. Neurologic:  Normal speech and language. No gross focal neurologic deficits are appreciated.  Skin:  Skin is warm, dry and intact. No rash noted. Psychiatric: Mood and affect are normal. Speech and behavior are normal. GU: Deferred   ____________________________________________   LABS (all labs ordered are listed, but only abnormal results are displayed)  Labs Reviewed  CULTURE,  BLOOD (ROUTINE X 2)  CULTURE, BLOOD (ROUTINE X 2)  RESP PANEL BY RT-PCR (FLU A&B, COVID) ARPGX2  GASTROINTESTINAL PANEL BY PCR, STOOL (REPLACES STOOL CULTURE)  C DIFFICILE QUICK SCREEN W PCR REFLEX  COMPREHENSIVE METABOLIC PANEL  LACTIC ACID, PLASMA  LACTIC ACID, PLASMA  CBC WITH DIFFERENTIAL/PLATELET  PROTIME-INR  URINALYSIS, COMPLETE (UACMP) WITH MICROSCOPIC   ____________________________________________   ED ECG REPORT I, Vanessa [place], the attending physician, personally viewed and interpreted this ECG.  Tachycardic rate of 124, no ST elevation, T wave inversion in aVL, normal intervals ____________________________________________  RADIOLOGY Robert Bellow, personally viewed and evaluated these images (plain radiographs) as part of my medical decision making, as well as reviewing the written report by the radiologist.  ED MD interpretation:  Chest x-ray no pneumonia  Official radiology report(s): DG Chest 2 View  Result Date: 07/12/2020 CLINICAL DATA:  60 year old male with weakness. Currently undergoing treatment for lymphoma. EXAM: CHEST - 2 VIEW COMPARISON:  06/24/2020 and prior chest radiographs FINDINGS: The cardiomediastinal silhouette is unchanged. Slightly prominent SUPERIOR mediastinum again noted. A RIGHT IJ Port-A-Cath is noted with tip overlying the mid SVC. There is no evidence of focal airspace disease, pulmonary edema, suspicious pulmonary nodule/mass, pleural effusion, or pneumothorax. No acute bony abnormalities are identified. IMPRESSION: No acute cardiopulmonary disease. Electronically Signed   By: Margarette Canada M.D.   On: 07/12/2020 09:57   CT ABDOMEN PELVIS W CONTRAST  Result Date: 07/12/2020 CLINICAL DATA:  Abdominal pain and fever.  Nausea and diarrhea. EXAM: CT ABDOMEN AND PELVIS WITH CONTRAST TECHNIQUE: Multidetector CT imaging of the abdomen and pelvis was performed using the standard protocol following bolus administration of intravenous contrast.  CONTRAST:  135m OMNIPAQUE IOHEXOL 300 MG/ML  SOLN COMPARISON:  PET CT dated 05/26/2020. FINDINGS: Lower chest: No acute abnormality. Hepatobiliary: No focal liver abnormality is seen. Gallbladder is unremarkable. No bile duct dilatation. Pancreas: Unremarkable. No pancreatic ductal dilatation or surrounding inflammatory changes. Spleen: Multiple hypodense masses within the spleen, largest measuring 2.8 cm, FDG avid on recent PET-CT indicating lymphoproliferative disorder and presumably related to the recent diagnosis of lymphoma. Adrenals/Urinary Tract: Adrenal glands appear normal. Kidneys are unremarkable without suspicious mass, stone or hydronephrosis. No perinephric fluid. No ureteral or bladder calculi are identified. Bladder is unremarkable, partially decompressed. Stomach/Bowel: Thickening of the walls of the majority of the colon. Additional thickening and enhancement of the walls of multiple segments of small bowel, most prominent within the jejunum and ileum. Fluid is seen throughout the small bowel, with associated air-fluid levels. Stomach is unremarkable.  Appendix is normal in caliber. Vascular/Lymphatic: Aortic atherosclerosis. No acute appearing vascular abnormality. The intra-abdominal intrapelvic lymphadenopathy demonstrated on recent PET-CT has significantly improved in the interval. Representative lymphadenopathy at the RIGHT pelvic sidewall previously measured 3.5 cm greatest thickness, now measuring 1.6 cm greatest thickness. Lymph node within the LEFT periaortic retroperitoneum previously measured 1.3 cm short axis dimension, now measuring 0.7 cm short axis. Reproductive: Prostate is unremarkable. Other: No free fluid or abscess collection is seen. No free intraperitoneal air. Musculoskeletal: No acute appearing osseous abnormality. IMPRESSION: 1. Significant thickening and enhancement of the walls of the majority of the colon and multiple segments of small bowel, most prominent within the  jejunum and ileum. Differential includes diffuse enterocolitis of infectious or inflammatory nature, typhlitis, radiation induced enterocolitis (if corresponding history of radiation) and graft-versus-host disease (if any corresponding history of bone marrow transplant). 2. The intra-abdominal intrapelvic lymphadenopathy demonstrated on recent PET-CT has significantly improved in the interval indicating a good response to interval treatment. Measurements provided above. 3. Multiple hypodense masses within the spleen, largest measuring 2.8 cm, FDG avid on recent PET-CT indicating lymphoproliferative disorder and also presumably related to the recent diagnosis of lymphoma. Aortic Atherosclerosis (ICD10-I70.0). Electronically Signed   By: SFranki CabotM.D.   On: 07/12/2020 12:32    ____________________________________________   PROCEDURES  Procedure(s) performed (including Critical Care):  .1-3 Lead EKG Interpretation Performed by: FVanessa Charlo MD Authorized by: FVanessa Readlyn MD     Interpretation: abnormal     ECG rate:  100s    ECG rate assessment: tachycardic     Rhythm: sinus tachycardia     Ectopy: none     Conduction: normal  ____________________________________________   INITIAL IMPRESSION / ASSESSMENT AND PLAN / ED COURSE  Christopher Mills was evaluated in Emergency Department on 07/12/2020 for the symptoms described in the history of present illness. He was evaluated in the context of the global COVID-19 pandemic, which necessitated consideration that the patient might be at risk for infection with the SARS-CoV-2 virus that causes COVID-19. Institutional protocols and algorithms that pertain to the evaluation of patients at risk for COVID-19 are in a state of rapid change based on information released by regulatory bodies including the CDC and federal and state organizations. These policies and algorithms were followed during the patient's care in the ED.    Patient is a  60 year old with lymphoma who comes in with febrile and tachycardic.  We will keep patient on the cardiac monitor.  We will do sepsis alert and start broad-spectrum antibiotics and get a lactate and start fluid resuscitation and broad-spectrum antibiotics to cover for possible neutropenic fever.  Last white count on 4/28 was 1.1.  Will get chest x-ray to evaluate for pneumonia, urine to evaluate for UTI.  COVID swab.  Patient does report a lot of episodes of diarrhea given he is immunosuppressed and will get GI stool studies as well as C. difficile study.  Will get CT abdomen to make sure no evidence of acute infection requiring surgery.  Patient will require admission for further work-up given immunosuppressant state with fever  White count is significantly lower than previous.  Patient also noted to be hyponatremic as well.  Patient is already gotten fluids and is feeling better.  Vital signs have normalized after treatments.  No indication for pressors on repeat evaluation patient is doing much better.  CT scan does show infectious versus inflammatory versus graft-versus-host of the intestinal wall.  Stool studies have been ordered.  Patient will require admission for further work-up and management of the this.  I did discuss the incidental findings on his CT scan     ____________________________________________   FINAL CLINICAL IMPRESSION(S) / ED DIAGNOSES   Final diagnoses:  Diarrhea, unspecified type  Sepsis, due to unspecified organism, unspecified whether acute organ dysfunction present ([place])  Neutropenic fever ([place])      MEDICATIONS GIVEN DURING THIS VISIT:  Medications  sodium chloride 0.9 % bolus 500 mL (has no administration in time range)  sodium chloride 0.9 % bolus 1,000 mL (0 mLs Intravenous Stopped 07/12/20 1115)  ceFEPIme (MAXIPIME) 2 g in sodium chloride 0.9 % 100 mL IVPB (0 g Intravenous Stopped 07/12/20 1016)  metroNIDAZOLE (FLAGYL) IVPB 500 mg (0 mg Intravenous Stopped  07/12/20 1238)  vancomycin (VANCOCIN) IVPB 1000 mg/200 mL premix (0 mg Intravenous Stopped 07/12/20 1238)  acetaminophen (TYLENOL) tablet 1,000 mg (1,000 mg Oral Given 07/12/20 1005)  iohexol (OMNIPAQUE) 300 MG/ML solution 100 mL (100 mLs Intravenous Contrast Given 07/12/20 1143)     ED Discharge Orders    None       Note:  This document was prepared using Dragon voice recognition software and may include unintentional dictation errors.   Vanessa [place], MD 07/12/20 [phone]

## 2020-07-12 NOTE — ED Triage Notes (Signed)
Pt to ED POV for weakness and "low temp". Cancer pt for lymphoma. Pt febrile on arrival and tachycardic.   Pt wants port accessed for labs   States has lost 20 lbs in one month   Pt taken to rm 13

## 2020-07-12 NOTE — ED Notes (Signed)
Second dark green blood tube pulled and sent to lab

## 2020-07-12 NOTE — Progress Notes (Signed)
CODE SEPSIS - PHARMACY COMMUNICATION  **Broad Spectrum Antibiotics should be administered within 1 hour of Sepsis diagnosis**  Time Code Sepsis Called/Page Received: 3546  Antibiotics Ordered: Cefepime / vancomycin / metronidazole  Time of 1st antibiotic administration: 0951  Additional action taken by pharmacy: N/A  Benita Gutter 07/12/2020  9:35 AM

## 2020-07-13 DIAGNOSIS — R197 Diarrhea, unspecified: Secondary | ICD-10-CM

## 2020-07-13 DIAGNOSIS — R531 Weakness: Secondary | ICD-10-CM

## 2020-07-13 DIAGNOSIS — N179 Acute kidney failure, unspecified: Secondary | ICD-10-CM

## 2020-07-13 DIAGNOSIS — E860 Dehydration: Secondary | ICD-10-CM

## 2020-07-13 DIAGNOSIS — D709 Neutropenia, unspecified: Secondary | ICD-10-CM

## 2020-07-13 DIAGNOSIS — D696 Thrombocytopenia, unspecified: Secondary | ICD-10-CM

## 2020-07-13 DIAGNOSIS — A020 Salmonella enteritis: Secondary | ICD-10-CM

## 2020-07-13 DIAGNOSIS — R5081 Fever presenting with conditions classified elsewhere: Secondary | ICD-10-CM

## 2020-07-13 DIAGNOSIS — A419 Sepsis, unspecified organism: Secondary | ICD-10-CM

## 2020-07-13 DIAGNOSIS — E871 Hypo-osmolality and hyponatremia: Secondary | ICD-10-CM

## 2020-07-13 LAB — CBC WITH DIFFERENTIAL/PLATELET
Abs Immature Granulocytes: 0.01 10*3/uL (ref 0.00–0.07)
Basophils Absolute: 0 10*3/uL (ref 0.0–0.1)
Basophils Relative: 1 %
Eosinophils Absolute: 0 10*3/uL (ref 0.0–0.5)
Eosinophils Relative: 1 %
HCT: 30.2 % — ABNORMAL LOW (ref 39.0–52.0)
Hemoglobin: 10.6 g/dL — ABNORMAL LOW (ref 13.0–17.0)
Immature Granulocytes: 1 %
Lymphocytes Relative: 44 %
Lymphs Abs: 0.3 10*3/uL — ABNORMAL LOW (ref 0.7–4.0)
MCH: 29.7 pg (ref 26.0–34.0)
MCHC: 35.1 g/dL (ref 30.0–36.0)
MCV: 84.6 fL (ref 80.0–100.0)
Monocytes Absolute: 0 10*3/uL — ABNORMAL LOW (ref 0.1–1.0)
Monocytes Relative: 6 %
Neutro Abs: 0.3 10*3/uL — CL (ref 1.7–7.7)
Neutrophils Relative %: 47 %
Platelets: 59 10*3/uL — ABNORMAL LOW (ref 150–400)
RBC: 3.57 MIL/uL — ABNORMAL LOW (ref 4.22–5.81)
RDW: 12.9 % (ref 11.5–15.5)
Smear Review: NORMAL
WBC: 0.7 10*3/uL — CL (ref 4.0–10.5)
nRBC: 0 % (ref 0.0–0.2)

## 2020-07-13 LAB — COMPREHENSIVE METABOLIC PANEL
ALT: 26 U/L (ref 0–44)
AST: 21 U/L (ref 15–41)
Albumin: 2.9 g/dL — ABNORMAL LOW (ref 3.5–5.0)
Alkaline Phosphatase: 60 U/L (ref 38–126)
Anion gap: 10 (ref 5–15)
BUN: 17 mg/dL (ref 6–20)
CO2: 22 mmol/L (ref 22–32)
Calcium: 8.8 mg/dL — ABNORMAL LOW (ref 8.9–10.3)
Chloride: 101 mmol/L (ref 98–111)
Creatinine, Ser: 0.97 mg/dL (ref 0.61–1.24)
GFR, Estimated: >60 mL/min (ref >60)
Glucose, Bld: 91 mg/dL (ref 70–99)
Potassium: 3.7 mmol/L (ref 3.5–5.1)
Sodium: 133 mmol/L — ABNORMAL LOW (ref 135–145)
Total Bilirubin: 1.2 mg/dL (ref 0.3–1.2)
Total Protein: 6.3 g/dL — ABNORMAL LOW (ref 6.5–8.1)

## 2020-07-13 LAB — C DIFFICILE QUICK SCREEN W PCR REFLEX
C Diff antigen: NEGATIVE
C Diff interpretation: NOT DETECTED
C Diff toxin: NEGATIVE

## 2020-07-13 LAB — GASTROINTESTINAL PANEL BY PCR, STOOL (REPLACES STOOL CULTURE)

## 2020-07-13 LAB — PHOSPHORUS: Phosphorus: 2.3 mg/dL — ABNORMAL LOW (ref 2.5–4.6)

## 2020-07-13 LAB — MAGNESIUM: Magnesium: 2.1 mg/dL (ref 1.7–2.4)

## 2020-07-13 MED ORDER — ZOLPIDEM TARTRATE 5 MG PO TABS
5.0000 mg | ORAL_TABLET | Freq: Every evening | ORAL | Status: DC | PRN
  Administered 2020-07-13 – 2020-07-15 (×2): 5 mg via ORAL
  Filled 2020-07-13 (×2): qty 1

## 2020-07-13 MED ORDER — CHLORHEXIDINE GLUCONATE CLOTH 2 % EX PADS
6.0000 | MEDICATED_PAD | Freq: Every day | CUTANEOUS | Status: DC
  Administered 2020-07-13 – 2020-07-16 (×4): 6 via TOPICAL

## 2020-07-13 NOTE — Consult Note (Signed)
Hematology/Oncology Consult note [facility] [phone] [phone]  Patient Care Team: [name], MD as PCP - General (Internal Medicine) Sindy Guadeloupe, MD as Consulting Physician (Hematology and Oncology)   Name of the patient: Christopher Mills  [id number]  1960/11/09   Date of visit: 07/13/20 REASON FOR COSULTATION:  Neutropenia, lymphoma, Salmonella species positive History of presenting illness-  60 y.o. male with PMH listed at below including triple hit lymphoma recently on chemotherapy R-CHOP, hypertension who called answering service on 07/12/2020 reporting feeling weak, multiple loose stool episodes and have low temperature 94-97 Fahrenheit at home.  I advised patient to go to emergency room for further evaluation. Patient presented to ER on 07/12/2020.  He does not have rigors, sore throat, shortness of breath, cough, dysuria, increased frequency or urgency, vomiting.  Denies any blood in the stool.  He ate in a steak house restaurant last week. 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  R-CHOP on 07/03/2020 and he received G-CSF support with Neulasta on 07/03/2020.  Has a Mediport.  In ER, patient was found to have a temperature of 100.7, tachycardic, labs are notable for hyponatremia with sodium level 128, creatinine 1.35, increased from his baseline of 1.07 on 07/10/2020 and 0.05 on 07/07/2020. CBC showed neutropenia with ANC of 0.4, platelet count 85,000. Stool sample negative for C. difficile, GI panel showed positive for Salmonella. Blood culture was pending.  COVID-19 negative. Chest x-ray not remarkable. 07/12/2020 CT abdomen pelvis with contrast showed thickening and enhancement of the colon and multiple segment of the small bowel, most prominent within jejunum and ileum.  Improvement of intra-abdominal intrapelvic lymphadenopathy compared to recent CT.  Indicating good response to treatment.  Spleen  masses. Patient was admitted for neutropenic fever, Salmonella positive enterocolitis, AKI and was started on cefepime and Flagyl, IV hydration Oncology was consulted for evaluation. Patient was seen at the bedside.  He has a male visitor at the bedside.  He reports feeling better since admission.  No loose bowel movement since then.  Denies abdominal pain, nausea vomiting.  Weakness is better.    Review of Systems  Constitutional: Positive for fatigue. Negative for appetite change, chills, fever and unexpected weight change.  HENT:   Negative for hearing loss and voice change.   Eyes: Negative for eye problems and icterus.  Respiratory: Negative for chest tightness, cough and shortness of breath.   Cardiovascular: Negative for chest pain and leg swelling.  Gastrointestinal: Positive for diarrhea. Negative for abdominal distention and abdominal pain.  Endocrine: Negative for hot flashes.  Genitourinary: Negative for difficulty urinating, dysuria and frequency.   Musculoskeletal: Negative for arthralgias.  Skin: Negative for itching and rash.  Neurological: Negative for light-headedness and numbness.  Hematological: Negative for adenopathy. Does not bruise/bleed easily.  Psychiatric/Behavioral: Negative for confusion.    No Known Allergies  Patient Active Problem List   Diagnosis Date Noted  . Neutropenic fever ([place]) 07/12/2020  . Diarrhea 07/12/2020  . Enterocolitis 07/12/2020  . Acute hyponatremia 07/12/2020  . Abdominal pain 07/12/2020  . Acute kidney injury ([place]) 07/12/2020  . Thrombocytopenia ([place]) 07/12/2020  . Generalized weakness 07/12/2020  . Dehydration 07/12/2020  . Hypertension   . High grade B-cell lymphoma ([place]) 06/18/2020     Past Medical History:  Diagnosis Date  . Acute kidney injury ([place]) 07/12/2020  . Dyspnea   . Hypertension   . 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 BONE MARROW  BIOPSY    . COLONOSCOPY    . EXCISION MASS NECK Right 06/12/2020   Procedure: EXCISION MASS NECK;  Surgeon: Jules Husbands, MD;  Location: [facility] ORS;  Service: General;  Laterality: Right;  . HERNIA REPAIR Right    at age 38-RIH  . PORTA CATH INSERTION    . PORTACATH PLACEMENT Right 06/24/2020   Procedure: INSERTION PORT-A-CATH;  Surgeon: Jules Husbands, MD;  Location: [facility] ORS;  Service: General;  Laterality: Right;    Social History   Socioeconomic History  . Marital status: Single    Spouse name: Not on file  . Number of children: Not on file  . Years of education: Not on file  . Highest education level: Not on file  Occupational History  . Not on file  Tobacco Use  . Smoking status: Former Smoker    Types: Cigarettes    Quit date: 06/13/2020    Years since quitting: 0.0  . Smokeless tobacco: Never Used  Vaping Use  . Vaping Use: Never used  Substance and Sexual Activity  . Alcohol use: Not Currently  . Drug use: Not Currently    Types: Marijuana  . Sexual activity: Not Currently  Other Topics Concern  . Not on file  Social History Narrative   Has daughter and grandson in the home   Social Determinants of Health   Financial Resource Strain: Not on file  Food Insecurity: Not on file  Transportation Needs: Not on file  Physical Activity: Not on file  Stress: Not on file  Social Connections: Not on file  Intimate Partner Violence: Not on file     Family History  Problem Relation Age of Onset  . Anemia Mother   . Hypertension Mother   . Goiter Mother   . Cancer Father   . Cancer Sister   . Multiple sclerosis Sister      Current Facility-Administered Medications:  .  acetaminophen (TYLENOL) tablet 650 mg, 650 mg, Oral, Q6H PRN **OR** acetaminophen (TYLENOL) suppository 650 mg, 650 mg, Rectal, Q6H PRN, Howerter, Justin B, DO .  ceFEPIme (MAXIPIME) 2 g in sodium chloride 0.9 % 100 mL IVPB, 2 g, Intravenous, Q8H, Benita Gutter, RPH, Last Rate: 200 mL/hr at  07/13/20 0834, 2 g at 07/13/20 0834 .  fentaNYL (SUBLIMAZE) injection 25 mcg, 25 mcg, Intravenous, Q2H PRN, Howerter, Justin B, DO, 25 mcg at 07/13/20 0829 .  lactated ringers infusion, , Intravenous, Continuous, Enzo Bi, MD, Last Rate: 100 mL/hr at 07/12/20 1705, Infusion Verify at 07/12/20 1705 .  metroNIDAZOLE (FLAGYL) IVPB 500 mg, 500 mg, Intravenous, Q8H, Howerter, Justin B, DO, Last Rate: 100 mL/hr at 07/13/20 0455, 500 mg at 07/13/20 0455 .  ondansetron (ZOFRAN) injection 4 mg, 4 mg, Intravenous, Q6H PRN, Howerter, Justin B, DO  Facility-Administered Medications Ordered in Other Encounters:  .  acetaminophen (TYLENOL) tablet 1,000 mg, 1,000 mg, Oral, Q6H PRN, Jennette Banker, MD   Physical exam:  Vitals:   07/13/20 1126 07/13/20 1208 07/13/20 1211 07/13/20 1254  BP: 101/71     Pulse: 93 93 (!) 129 100  Resp: 15     Temp: 98.8 F (37.1 C)     TempSrc:      SpO2: 99%   100%  Weight:      Height:       Physical Exam Constitutional:      General: He is not in acute distress.  Appearance: He is not diaphoretic.  HENT:     Head: Normocephalic and atraumatic.     Nose: Nose normal.     Mouth/Throat:     Pharynx: No oropharyngeal exudate.  Eyes:     General: No scleral icterus.    Pupils: Pupils are equal, round, and reactive to light.  Cardiovascular:     Rate and Rhythm: Normal rate and regular rhythm.     Heart sounds: No murmur heard.   Pulmonary:     Effort: Pulmonary effort is normal. No respiratory distress.     Breath sounds: No rales.  Chest:     Chest wall: No tenderness.  Abdominal:     General: There is no distension.     Palpations: Abdomen is soft.     Tenderness: There is no abdominal tenderness.  Musculoskeletal:        General: Normal range of motion.     Cervical back: Normal range of motion and neck supple.  Skin:    General: Skin is warm and dry.     Findings: No erythema.  Neurological:     Mental Status: He is alert and oriented to  person, place, and time.     Cranial Nerves: No cranial nerve deficit.     Motor: No abnormal muscle tone.     Coordination: Coordination normal.  Psychiatric:        Mood and Affect: Affect normal.         CMP Latest Ref Rng & Units 07/13/2020  Glucose 70 - 99 mg/dL 91  BUN 6 - 20 mg/dL 17  Creatinine 0.61 - 1.24 mg/dL 0.97  Sodium 135 - 145 mmol/L 133(L)  Potassium 3.5 - 5.1 mmol/L 3.7  Chloride 98 - 111 mmol/L 101  CO2 22 - 32 mmol/L 22  Calcium 8.9 - 10.3 mg/dL 8.8(L)  Total Protein 6.5 - 8.1 g/dL 6.3(L)  Total Bilirubin 0.3 - 1.2 mg/dL 1.2  Alkaline Phos 38 - 126 U/L 60  AST 15 - 41 U/L 21  ALT 0 - 44 U/L 26   CBC Latest Ref Rng & Units 07/13/2020  WBC 4.0 - 10.5 K/uL 0.7(LL)  Hemoglobin 13.0 - 17.0 g/dL 10.6(L)  Hematocrit 39.0 - 52.0 % 30.2(L)  Platelets 150 - 400 K/uL 59(L)    RADIOGRAPHIC STUDIES: I have personally reviewed the radiological images as listed and agreed with the findings in the report. DG Chest 2 View  Result Date: 07/12/2020 CLINICAL DATA:  60 year old male with weakness. Currently undergoing treatment for lymphoma. EXAM: CHEST - 2 VIEW COMPARISON:  06/24/2020 and prior chest radiographs FINDINGS: The cardiomediastinal silhouette is unchanged. Slightly prominent SUPERIOR mediastinum again noted. A RIGHT IJ Port-A-Cath is noted with tip overlying the mid SVC. There is no evidence of focal airspace disease, pulmonary edema, suspicious pulmonary nodule/mass, pleural effusion, or pneumothorax. No acute bony abnormalities are identified. IMPRESSION: No acute cardiopulmonary disease. Electronically Signed   By: Margarette Canada M.D.   On: 07/12/2020 09:57   NM Cardiac Muga Rest  Result Date: 07/01/2020 CLINICAL DATA:  High-grade B-cell lymphoma, pre cardiotoxic chemotherapy EXAM: NUCLEAR MEDICINE CARDIAC BLOOD POOL IMAGING (MUGA) TECHNIQUE: Cardiac multi-gated acquisition was performed at rest following intravenous injection of Tc-71mlabeled red blood cells.  RADIOPHARMACEUTICALS:  21.059 mCi Tc-939mertechnetate in-vitro labeled red blood cells IV COMPARISON:  None FINDINGS: Calculated LEFT ventricular ejection fraction is 54.4%, normal. Study was obtained at a cardiac rate of 56 bpm. Patient was rhythmic during imaging. Cine analysis of the  LEFT ventricle in 3 projections demonstrates normal LV wall motion. IMPRESSION: Normal LEFT ventricular ejection fraction of 54.4%. Normal LEFT ventricular wall motion. Electronically Signed   By: Lavonia Dana M.D.   On: 07/01/2020 15:16   CT ABDOMEN PELVIS W CONTRAST  Result Date: 07/12/2020 CLINICAL DATA:  Abdominal pain and fever.  Nausea and diarrhea. EXAM: CT ABDOMEN AND PELVIS WITH CONTRAST TECHNIQUE: Multidetector CT imaging of the abdomen and pelvis was performed using the standard protocol following bolus administration of intravenous contrast. CONTRAST:  193m OMNIPAQUE IOHEXOL 300 MG/ML  SOLN COMPARISON:  PET CT dated 05/26/2020. FINDINGS: Lower chest: No acute abnormality. Hepatobiliary: No focal liver abnormality is seen. Gallbladder is unremarkable. No bile duct dilatation. Pancreas: Unremarkable. No pancreatic ductal dilatation or surrounding inflammatory changes. Spleen: Multiple hypodense masses within the spleen, largest measuring 2.8 cm, FDG avid on recent PET-CT indicating lymphoproliferative disorder and presumably related to the recent diagnosis of lymphoma. Adrenals/Urinary Tract: Adrenal glands appear normal. Kidneys are unremarkable without suspicious mass, stone or hydronephrosis. No perinephric fluid. No ureteral or bladder calculi are identified. Bladder is unremarkable, partially decompressed. Stomach/Bowel: Thickening of the walls of the majority of the colon. Additional thickening and enhancement of the walls of multiple segments of small bowel, most prominent within the jejunum and ileum. Fluid is seen throughout the small bowel, with associated air-fluid levels. Stomach is unremarkable.  Appendix  is normal in caliber. Vascular/Lymphatic: Aortic atherosclerosis. No acute appearing vascular abnormality. The intra-abdominal intrapelvic lymphadenopathy demonstrated on recent PET-CT has significantly improved in the interval. Representative lymphadenopathy at the RIGHT pelvic sidewall previously measured 3.5 cm greatest thickness, now measuring 1.6 cm greatest thickness. Lymph node within the LEFT periaortic retroperitoneum previously measured 1.3 cm short axis dimension, now measuring 0.7 cm short axis. Reproductive: Prostate is unremarkable. Other: No free fluid or abscess collection is seen. No free intraperitoneal air. Musculoskeletal: No acute appearing osseous abnormality. IMPRESSION: 1. Significant thickening and enhancement of the walls of the majority of the colon and multiple segments of small bowel, most prominent within the jejunum and ileum. Differential includes diffuse enterocolitis of infectious or inflammatory nature, typhlitis, radiation induced enterocolitis (if corresponding history of radiation) and graft-versus-host disease (if any corresponding history of bone marrow transplant). 2. The intra-abdominal intrapelvic lymphadenopathy demonstrated on recent PET-CT has significantly improved in the interval indicating a good response to interval treatment. Measurements provided above. 3. Multiple hypodense masses within the spleen, largest measuring 2.8 cm, FDG avid on recent PET-CT indicating lymphoproliferative disorder and also presumably related to the recent diagnosis of lymphoma. Aortic Atherosclerosis (ICD10-I70.0). Electronically Signed   By: SFranki CabotM.D.   On: 07/12/2020 12:32   DG CHEST PORT 1 VIEW  Result Date: 06/24/2020 CLINICAL DATA:  Status post port placement EXAM: PORTABLE CHEST 1 VIEW COMPARISON:  10/21/2019 FINDINGS: Right-sided chest wall port is noted. Catheter tip is seen in the mid superior vena cava in satisfactory position. No pneumothorax is noted. Persistent  fullness in the right paratracheal space is noted consistent with the patient's known history. Cardiac shadow is within normal limits. Mild bibasilar atelectasis is noted related to a poor inspiratory effort. No bony abnormality is seen. IMPRESSION: No pneumothorax following port placement. Electronically Signed   By: MInez CatalinaM.D.   On: 06/24/2020 11:28   CT BONE MARROW BIOPSY & ASPIRATION  Result Date: 06/25/2020 INDICATION: New diagnosis of lymphoma. Please perform CT-guided bone marrow biopsy for tissue diagnostic purposes. EXAM: CT-GUIDED BONE MARROW BIOPSY AND ASPIRATION MEDICATIONS: None ANESTHESIA/SEDATION: Fentanyl  50 mcg IV; Versed 1 mg IV Sedation Time: 10 Minutes; The patient was continuously monitored during the procedure by the interventional radiology nurse under my direct supervision. COMPLICATIONS: None immediate. PROCEDURE: Informed consent was obtained from the patient following an explanation of the procedure, risks, benefits and alternatives. The patient understands, agrees and consents for the procedure. All questions were addressed. A time out was performed prior to the initiation of the procedure. The patient was positioned prone and non-contrast localization CT was performed of the pelvis to demonstrate the iliac marrow spaces. The operative site was prepped and draped in the usual sterile fashion. Under sterile conditions and local anesthesia, a 22 gauge spinal needle was utilized for procedural planning. Next, an 11 gauge coaxial bone biopsy needle was advanced into the left iliac marrow space. Needle position was confirmed with CT imaging. Initially, a bone marrow aspiration was performed. Next, a bone marrow biopsy was obtained with the 11 gauge outer bone marrow device. Samples were prepared with the cytotechnologist and deemed adequate. The needle was removed and superficial hemostasis was obtained with manual compression. A dressing was applied. The patient tolerated the  procedure well without immediate post procedural complication. IMPRESSION: Successful CT guided left iliac bone marrow aspiration and core biopsy. Electronically Signed   By: Sandi Mariscal M.D.   On: 06/25/2020 10:30   DG C-Arm 1-60 Min-No Report  Result Date: 06/24/2020 Fluoroscopy was utilized by the requesting physician.  No radiographic interpretation.   DG FLUORO GUIDED LOC OF NEEDLE/CATH TIP FOR SPINAL INJECT LT  Result Date: 07/08/2020 CLINICAL DATA:  Non-Hodgkin's lymphoma EXAM: DIAGNOSTIC LUMBAR PUNCTURE UNDER FLUOROSCOPIC GUIDANCE COMPARISON:  None FLUOROSCOPY TIME:  Fluoroscopy Time:  30 seconds Radiation Exposure Index (if provided by the fluoroscopic device): 4.9 mGy Number of Acquired Spot Images: 0 PROCEDURE: Informed consent was obtained from the patient prior to the procedure, including potential complications of headache, allergy, and pain. With the patient prone, the lower back was prepped with Betadine. 1% Lidocaine was used for local anesthesia. Lumbar puncture was performed at the L4-5 level using a 22 gauge needle with return of clear 5 ml of CSF were obtained for laboratory studies. Subsequently, 12 mg of methotrexate in 5 mL was hand injected into the thecal sac. The patient tolerated the procedure well and there were no apparent complications. IMPRESSION: Successful fluoroscopic guided lumbar puncture for methotrexate injection. Electronically Signed   By: Kathreen Devoid   On: 07/08/2020 13:54    Assessment and plan-   #Salmonella enterocolitis in the setting of neutropenic fever Abdominal examination is benign today. On cefepime and Flagyl.  Recommend infectious disease consultation. I discussed with Dr. Billie Ruddy who discussed with ID who recommends patient to be kept on cefepime  and Dr. Ola Spurr to see tomorrow. Follow-up blood culture which showed no growth less than 24 hours. Neutropenia fever, last chemotherapy on 07/03/2020, status post long-acting G-CSF with Neulasta.   Continue monitor CBC daily.  #Thrombocytopenia, and anemia, likely due to recent chemotherapy.  Continue monitor.  Check smear #AKI, creatinine has improved to his baseline 0.97.  #Triple hit high-grade B-cell lymphoma, status post chemo.  CT indicates positive treatment response.  Follow-up with Dr. Lujean Rave you for allowing me to participate in the care of this patient.    Earlie Server, MD, PhD Hematology Oncology [facility] Pager- [phone] 07/13/2020

## 2020-07-13 NOTE — TOC Initial Note (Addendum)
Transition of Care [facility]) - Initial/Assessment Note    Patient Details  Name: Christopher Mills MRN: [id number] Date of Birth: Jan 24, 1961  [facility] CM/SW Contact:    Magnus Ivan, LCSW Phone Number: 07/13/2020, 2:04 PM  Clinical Narrative:                CSW spoke with patient via phone due to Protective Precautions. Patient reported he lives with his daughter and drives himself to appointments. PCP is Dr. Rosario Jacks. [facility] [place]. No DME/HH/SNF history. CSW explained PT recs for 3 in 1 and OPPT. Patient stated "I don't need none of that stuff. I'm fine I'll be alright." Denied resource needs.   Expected Discharge Plan: Home/Self Care Barriers to Discharge: Continued Medical Work up   Patient Goals and CMS Choice Patient states their goals for this hospitalization and ongoing recovery are:: to return home with daughter CMS Medicare.gov Compare Post Acute Care list provided to:: Patient Choice offered to / list presented to : Patient  Expected Discharge Plan and Services Expected Discharge Plan: Home/Self Care       Living arrangements for the past 2 months: Single Family Home                                      Prior Living Arrangements/Services Living arrangements for the past 2 months: Single Family Home Lives with:: Adult Children Patient language and need for interpreter reviewed:: Yes        Need for Family Participation in Patient Care: Yes (Comment) Care giver support system in place?: Yes (comment)   Criminal Activity/Legal Involvement Pertinent to Current Situation/Hospitalization: No - Comment as needed  Activities of Daily Living Home Assistive Devices/Equipment: None ADL Screening (condition at time of admission) Patient's cognitive ability adequate to safely complete daily activities?: Yes Is the patient deaf or have difficulty hearing?: No Does the patient have difficulty seeing, even when wearing glasses/contacts?:  Yes Does the patient have difficulty concentrating, remembering, or making decisions?: No Patient able to express need for assistance with ADLs?: No Does the patient have difficulty dressing or bathing?: No Independently performs ADLs?: Yes (appropriate for developmental age) Communication: Independent Dressing (OT): Independent Grooming: Independent Feeding: Independent Bathing: Independent Toileting: Independent In/Out Bed: Independent Walks in Home: Independent Does the patient have difficulty walking or climbing stairs?: No Weakness of Legs: None Weakness of Arms/Hands: None  Permission Sought/Granted                  Emotional Assessment       Orientation: : Oriented to Self,Oriented to Place,Oriented to  Time,Oriented to Situation Alcohol / Substance Use: Not Applicable Psych Involvement: No (comment)  Admission diagnosis:  Neutropenic fever ([place]) [D70.9, R50.81] Diarrhea, unspecified type [R19.7] Sepsis, due to unspecified organism, unspecified whether acute organ dysfunction present [facility]) [A41.9] Patient Active Problem List   Diagnosis Date Noted  . Neutropenic fever ([place]) 07/12/2020  . Diarrhea 07/12/2020  . Enterocolitis 07/12/2020  . Acute hyponatremia 07/12/2020  . Abdominal pain 07/12/2020  . Acute kidney injury ([place]) 07/12/2020  . Thrombocytopenia ([place]) 07/12/2020  . Generalized weakness 07/12/2020  . Dehydration 07/12/2020  . Hypertension   . High grade B-cell lymphoma ([place]) 06/18/2020   PCP:  [name], MD Pharmacy:   [id number] [place] [place] - [address] [place] [address] Phone: [phone] Fax:  [phone]     Social Determinants of Health (SDOH) Interventions    Readmission Risk Interventions No flowsheet data found.

## 2020-07-13 NOTE — Progress Notes (Signed)
Lab reported pts GI panel positive for salmonella, reported results to Dr. Damita Dunnings. Will continue to monitor

## 2020-07-13 NOTE — Progress Notes (Signed)
Vitals while pt with PT. Yellow mews protocol not started. Will reassess at close of session.   07/13/20 1211  Assess: MEWS Score  Pulse Rate (!) 129 (working with PT)  Assess: MEWS Score  MEWS Temp 0  MEWS Systolic 0  MEWS Pulse 2  MEWS RR 0  MEWS LOC 0  MEWS Score 2  MEWS Score Color Yellow  Assess: if the MEWS score is Yellow or Red  Were vital signs taken at a resting state? No  Focused Assessment No change from prior assessment  Early Detection of Sepsis Score *See Row Information* Medium  MEWS guidelines implemented *See Row Information* No, other (Comment) (pt working with PT. Will reassess after close of session)

## 2020-07-13 NOTE — Progress Notes (Signed)
PROGRESS NOTE    Christopher Mills  [id number] DOB: [birth year] DOA: 07/12/2020 PCP: [name], MD  106A/[id number]-AA   Assessment & Plan:   Principal Problem:   Neutropenic fever ([place]) Active Problems:   Diarrhea   Enterocolitis   Acute hyponatremia   Abdominal pain   Acute kidney injury ([place])   Thrombocytopenia ([place])   Generalized weakness   Dehydration   Hypertension   Sepsis ([place])   Salmonella gastroenteritis   Christopher Mills is a 60 y.o. male with medical history significant for high-grade B-cell lymphoma undergoing chemotherapy, hypertension, who is admitted to [facility] on 07/12/2020 with neutropenic fever after presenting from home to [facility] ED complaining of generalized weakness and diarrhea.   #) Neutropenic fever, POA Salmonella gastroenteritis  --temperature of 100.7, with absolute neutrophil count 400.  This is in the context of an underlying history of high-grade B cell lymphoma for which the patient has been undergoing chemotherapy, with most recent infusion occurring 3 weeks ago per the patient.   --GI path pos for Salmonella --started on cefepime and flagyl on presentation Plan: --discussed with weekend oncall ID, who recommended continue cefepime --ID to see pt on Monday --trend ANC --resume diet  Severe Sepsis, POA --fever, tachycardia, neutropenia with GI infection, with AKI.  #) Acute hypovolemic hyponatremia --Presenting serum sodium found to be 128, improved with IVF. --cont MIVF@50   #) Acute kidney injury, POA --Presenting creatinine found to be 1.35, baseline ~1.  Likely due to dehydration. --s/p IVF Plan: --cont MIVF@50  --Hold home lisinopril.    #) Thrombocytopenia:  Presenting CBC notable for platelet count of 85. Suspect that this is a consequence of the patient's chemotherapy.   --Monitor for now  #) Generalized weakness --PT  #) Essential hypertension Outpatient antihypertensive regimen consists of  lisinopril and atenolol.   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   --CT indicates positive treatment response.   --Follow-up with outpatient oncology .  DVT prophylaxis: SCD/Compression stockings Code Status: Full code  Family Communication: daughter updated on the phone today Level of care: Med-Surg Dispo:   The patient is from: home Anticipated d/c is to: home Anticipated d/c date is: 2-3 days Patient currently is not medically ready to d/c due to: neutropenic with active infection, pending ID consult   Subjective and Interval History:  Pt reported being thirsty since he was made NPO on admission.  Also reported weakness.  Diarrhea had resolved.  No abdominal pain.   Objective: Vitals:   07/13/20 1211 07/13/20 1254 07/13/20 1730 07/13/20 2029  BP:   121/80 109/66  Pulse: (!) 129 100 93 94  Resp:   17 16  Temp:   99 F (37.2 C) 98.3 F (36.8 C)  TempSrc:    Oral  SpO2:  100% 100% 98%  Weight:      Height:        Intake/Output Summary (Last 24 hours) at 07/13/2020 2043 Last data filed at 07/13/2020 1547 Gross per 24 hour  Intake 2357.16 ml  Output --  Net 2357.16 ml   Filed Weights   07/12/20 0903  Weight: 79.4 kg    Examination:   Constitutional: NAD, AAOx3 HEENT: conjunctivae and lids normal, EOMI CV: No cyanosis.   RESP: normal respiratory effort, on RA Extremities: No effusions, edema in BLE SKIN: warm, dry Neuro:  II - XII grossly intact.   Psych: Normal mood and affect.  Appropriate judgement and reason   Data Reviewed: I have personally reviewed following labs and imaging studies  CBC: Recent Labs  Lab 07/07/20 0758 07/10/20 0814 07/12/20 0911 07/13/20 0835  WBC 23.8* 1.1* 0.8* 0.7*  NEUTROABS 16.0* 0.2* 0.4* 0.3*  HGB 12.6* 13.4 12.5* 10.6*  HCT 36.7* 39.3 35.9* 30.2*  MCV 86.6  85.4 84.3 84.6  PLT 98* 79* 85* 59*   Basic Metabolic Panel: Recent Labs  Lab 07/07/20 0758 07/10/20 0814 07/12/20 0911 07/13/20 0835  NA 137 134* 128* 133*  K 3.6 3.7 4.1 3.7  CL 101 99 96* 101  CO2 25 26 21* 22  GLUCOSE 90 105* 120* 91  BUN 17 24* 26* 17  CREATININE 0.95 1.07 1.35* 0.97  CALCIUM 9.3 9.4 9.4 8.8*  MG  --   --  2.1 2.1  PHOS  --   --   --  2.3*   GFR: Estimated Creatinine Clearance: 91 mL/min (by C-G formula based on SCr of 0.97 mg/dL). Liver Function Tests: Recent Labs  Lab 07/07/20 0758 07/10/20 0814 07/12/20 0911 07/13/20 0835  AST 23 27 14* 21  ALT 24 37 25 26  ALKPHOS 84 88 73 60  BILITOT 0.9 0.9 1.3* 1.2  PROT 6.9 6.9 7.2 6.3*  ALBUMIN 3.8 3.9 3.6 2.9*   Recent Labs  Lab 07/12/20 0911  LIPASE 21   No results for input(s): AMMONIA in the last 168 hours. Coagulation Profile: Recent Labs  Lab 07/12/20 0911  INR 1.1   Cardiac Enzymes: No results for input(s): CKTOTAL, CKMB, CKMBINDEX, TROPONINI in the last 168 hours. BNP (last 3 results) No results for input(s): PROBNP in the last 8760 hours. HbA1C: No results for input(s): HGBA1C in the last 72 hours. CBG: No results for input(s): GLUCAP in the last 168 hours. Lipid Profile: No results for input(s): CHOL, HDL, LDLCALC, TRIG, CHOLHDL, LDLDIRECT in the last 72 hours. Thyroid Function Tests: Recent Labs    07/12/20 0911  TSH 0.467   Anemia Panel: No results for input(s): VITAMINB12, FOLATE, FERRITIN, TIBC, IRON, RETICCTPCT in the last 72 hours. Sepsis Labs: Recent Labs  Lab 07/12/20 0940 07/12/20 1113  LATICACIDVEN 1.5 1.2    Recent Results (from the past 240 hour(s))  Culture, blood (Routine x 2)     Status: None (Preliminary result)   Collection Time: 07/12/20  9:40 AM   Specimen: BLOOD  Result Value Ref Range Status   Specimen Description BLOOD LEFT ANTECUBITAL  Final   Special Requests   Final    BOTTLES DRAWN AEROBIC AND ANAEROBIC Blood Culture results may not be  optimal due to an inadequate volume of blood received in culture bottles   Culture   Final    NO GROWTH < 24 HOURS Performed at [facility], [address] [place], [place] [address]    Report Status PENDING  Incomplete  Culture, blood (Routine x 2)     Status: None (Preliminary result)   Collection Time: 07/12/20  9:40 AM   Specimen: BLOOD  Result Value Ref Range Status   Specimen Description BLOOD PART  Final   Special Requests   Final    BOTTLES DRAWN AEROBIC AND ANAEROBIC Blood Culture adequate volume   Culture   Final    NO GROWTH < 24 HOURS Performed at [facility], [address], [place], [place] [address]    Report Status PENDING  Incomplete  Resp  Panel by RT-PCR (Flu A&B, Covid) Nasopharyngeal Swab     Status: None   Collection Time: 07/12/20 10:02 AM   Specimen: Nasopharyngeal Swab; Nasopharyngeal(NP) swabs in vial transport medium  Result Value Ref Range Status   SARS Coronavirus 2 by RT PCR NEGATIVE NEGATIVE Final    Comment: (NOTE) SARS-CoV-2 target nucleic acids are NOT DETECTED.  The SARS-CoV-2 RNA is generally detectable in upper respiratory specimens during the acute phase of infection. The lowest concentration of SARS-CoV-2 viral copies this assay can detect is 138 copies/mL. A negative result does not preclude SARS-Cov-2 infection and should not be used as the sole basis for treatment or other patient management decisions. A negative result may occur with  improper specimen collection/handling, submission of specimen other than nasopharyngeal swab, presence of viral mutation(s) within the areas targeted by this assay, and inadequate number of viral copies(<138 copies/mL). A negative result must be combined with clinical observations, patient history, and epidemiological information. The expected result is Negative.  Fact Sheet for Patients:  EntrepreneurPulse.com.au  Fact Sheet for Healthcare Providers:   IncredibleEmployment.be  This test is no t yet approved or cleared by the [place] FDA and  has been authorized for detection and/or diagnosis of SARS-CoV-2 by FDA under an Emergency Use Authorization (EUA). This EUA will remain  in effect (meaning this test can be used) for the duration of the COVID-19 declaration under Section 564(b)(1) of the Act, 21 U.S.C.section 360bbb-3(b)(1), unless the authorization is terminated  or revoked sooner.       Influenza A by PCR NEGATIVE NEGATIVE Final   Influenza B by PCR NEGATIVE NEGATIVE Final    Comment: (NOTE) The Xpert Xpress SARS-CoV-2/FLU/RSV plus assay is intended as an aid in the diagnosis of influenza from Nasopharyngeal swab specimens and should not be used as a sole basis for treatment. Nasal washings and aspirates are unacceptable for Xpert Xpress SARS-CoV-2/FLU/RSV testing.  Fact Sheet for Patients: EntrepreneurPulse.com.au  Fact Sheet for Healthcare Providers: IncredibleEmployment.be  This test is not yet approved or cleared by the [place] FDA and has been authorized for detection and/or diagnosis of SARS-CoV-2 by FDA under an Emergency Use Authorization (EUA). This EUA will remain in effect (meaning this test can be used) for the duration of the COVID-19 declaration under Section 564(b)(1) of the Act, 21 U.S.C. section 360bbb-3(b)(1), unless the authorization is terminated or revoked.  Performed at [facility], [address] [place], [place] [address]   Gastrointestinal Panel by PCR , Stool     Status: Abnormal   Collection Time: 07/13/20  2:12 AM   Specimen: Stool  Result Value Ref Range Status   Campylobacter species NOT DETECTED NOT DETECTED Final   Plesimonas shigelloides NOT DETECTED NOT DETECTED Final   Salmonella species DETECTED (A) NOT DETECTED Final    Comment: RESULT CALLED TO, READ BACK BY AND VERIFIED WITH: ALEXIS STOGDEN ON  07/13/20 AT 0353 QSD    Yersinia enterocolitica NOT DETECTED NOT DETECTED Final   Vibrio species NOT DETECTED NOT DETECTED Final   Vibrio cholerae NOT DETECTED NOT DETECTED Final   Enteroaggregative E coli (EAEC) NOT DETECTED NOT DETECTED Final   Enteropathogenic E coli (EPEC) NOT DETECTED NOT DETECTED Final   Enterotoxigenic E coli (ETEC) NOT DETECTED NOT DETECTED Final   Shiga like toxin producing E coli (STEC) NOT DETECTED NOT DETECTED Final   Shigella/Enteroinvasive E coli (EIEC) NOT DETECTED NOT DETECTED Final   Cryptosporidium NOT DETECTED NOT DETECTED Final   Cyclospora cayetanensis NOT  DETECTED NOT DETECTED Final   Entamoeba histolytica NOT DETECTED NOT DETECTED Final   Giardia lamblia NOT DETECTED NOT DETECTED Final   Adenovirus F40/41 NOT DETECTED NOT DETECTED Final   Astrovirus NOT DETECTED NOT DETECTED Final   Norovirus GI/GII NOT DETECTED NOT DETECTED Final   Rotavirus A NOT DETECTED NOT DETECTED Final   Sapovirus (I, II, IV, and V) NOT DETECTED NOT DETECTED Final    Comment: Performed at [facility], [address] [place], [place] [address]  C Difficile Quick Screen w PCR reflex     Status: None   Collection Time: 07/13/20  2:12 AM   Specimen: STOOL  Result Value Ref Range Status   C Diff antigen NEGATIVE NEGATIVE Final   C Diff toxin NEGATIVE NEGATIVE Final   C Diff interpretation No C. difficile detected.  Final    Comment: Performed at [facility], [address], [place], [place] [address]      Radiology Studies: DG Chest 2 View  Result Date: 07/12/2020 CLINICAL DATA:  60 year old male with weakness. Currently undergoing treatment for lymphoma. EXAM: CHEST - 2 VIEW COMPARISON:  06/24/2020 and prior chest radiographs FINDINGS: The cardiomediastinal silhouette is unchanged. Slightly prominent SUPERIOR mediastinum again noted. A RIGHT IJ Port-A-Cath is noted with tip overlying the mid SVC. There is no evidence of focal airspace disease, pulmonary  edema, suspicious pulmonary nodule/mass, pleural effusion, or pneumothorax. No acute bony abnormalities are identified. IMPRESSION: No acute cardiopulmonary disease. Electronically Signed   By: Margarette Canada M.D.   On: 07/12/2020 09:57   CT ABDOMEN PELVIS W CONTRAST  Result Date: 07/12/2020 CLINICAL DATA:  Abdominal pain and fever.  Nausea and diarrhea. EXAM: CT ABDOMEN AND PELVIS WITH CONTRAST TECHNIQUE: Multidetector CT imaging of the abdomen and pelvis was performed using the standard protocol following bolus administration of intravenous contrast. CONTRAST:  161mL OMNIPAQUE IOHEXOL 300 MG/ML  SOLN COMPARISON:  PET CT dated 05/26/2020. FINDINGS: Lower chest: No acute abnormality. Hepatobiliary: No focal liver abnormality is seen. Gallbladder is unremarkable. No bile duct dilatation. Pancreas: Unremarkable. No pancreatic ductal dilatation or surrounding inflammatory changes. Spleen: Multiple hypodense masses within the spleen, largest measuring 2.8 cm, FDG avid on recent PET-CT indicating lymphoproliferative disorder and presumably related to the recent diagnosis of lymphoma. Adrenals/Urinary Tract: Adrenal glands appear normal. Kidneys are unremarkable without suspicious mass, stone or hydronephrosis. No perinephric fluid. No ureteral or bladder calculi are identified. Bladder is unremarkable, partially decompressed. Stomach/Bowel: Thickening of the walls of the majority of the colon. Additional thickening and enhancement of the walls of multiple segments of small bowel, most prominent within the jejunum and ileum. Fluid is seen throughout the small bowel, with associated air-fluid levels. Stomach is unremarkable.  Appendix is normal in caliber. Vascular/Lymphatic: Aortic atherosclerosis. No acute appearing vascular abnormality. The intra-abdominal intrapelvic lymphadenopathy demonstrated on recent PET-CT has significantly improved in the interval. Representative lymphadenopathy at the RIGHT pelvic sidewall  previously measured 3.5 cm greatest thickness, now measuring 1.6 cm greatest thickness. Lymph node within the LEFT periaortic retroperitoneum previously measured 1.3 cm short axis dimension, now measuring 0.7 cm short axis. Reproductive: Prostate is unremarkable. Other: No free fluid or abscess collection is seen. No free intraperitoneal air. Musculoskeletal: No acute appearing osseous abnormality. IMPRESSION: 1. Significant thickening and enhancement of the walls of the majority of the colon and multiple segments of small bowel, most prominent within the jejunum and ileum. Differential includes diffuse enterocolitis of infectious or inflammatory nature, typhlitis, radiation induced enterocolitis (if corresponding history  of radiation) and graft-versus-host disease (if any corresponding history of bone marrow transplant). 2. The intra-abdominal intrapelvic lymphadenopathy demonstrated on recent PET-CT has significantly improved in the interval indicating a good response to interval treatment. Measurements provided above. 3. Multiple hypodense masses within the spleen, largest measuring 2.8 cm, FDG avid on recent PET-CT indicating lymphoproliferative disorder and also presumably related to the recent diagnosis of lymphoma. Aortic Atherosclerosis (ICD10-I70.0). Electronically Signed   By: Franki Cabot M.D.   On: 07/12/2020 12:32     Scheduled Meds: . Chlorhexidine Gluconate Cloth  6 each Topical Daily   Continuous Infusions: . ceFEPime (MAXIPIME) IV 2 g (07/13/20 1911)  . lactated ringers 50 mL/hr at 07/13/20 1909  . metronidazole 500 mg (07/13/20 1637)     LOS: 1 day     Enzo Bi, MD [facility] If 7PM-7AM, please contact night-coverage 07/13/2020, 8:43 PM

## 2020-07-13 NOTE — Progress Notes (Signed)
MD, Lolita Cram notified verbally of critical labs. WBC  0.7 and Neut# count 0.3.

## 2020-07-13 NOTE — Evaluation (Signed)
Physical Therapy Evaluation Patient Details Name: Christopher Mills MRN: [id number] DOB: 1960/06/02 Today's Date: 07/13/2020   History of Present Illness  Pt admitted to [facility] on 07/12/20 for c/o weakness and "low temp." Pt febrile and tachycardic upon arrival. Pt states feeling weak since last CA treatment on Thursday and has not eaten/drank anything since Friday AM. Pt also endorses diarrhea, abdominal pain in bil lower quadrants PMH significant for: lymphoma (on chemotherapy) and HTN. Lab results (+) for salmonella.    Clinical Impression  Pt is a 60 year old M admitted to hospital on 4/30 for weakness, fever, and tachycardia. 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 Pt presents with decreased activity tolerance, mild deficits in standing dynamic balance, and decreased cardiopulmonary tolerance to activity. Due to deficits, pt required mod I for bed mobility, supervision for transfers, and supervision for short distance ambulation without AD. Impaired cardiopulmonary tolerance to activity noted by tachycardia to 129 bpm with minimal mobility and pt demonstrating labored breathing, despite RPE of 2-3/10 indicating "light activity." Deficits limit the pt's ability to safely and independently perform ADL's, transfer, and ambulate. Pt will benefit from acute skilled PT services to address deficits for return to baseline function. At this time, PT recommends DC home with referral to OPPT for deconditioning/balance so that pt can return to work. Pt will also benefit from 3in1 for safety and energy conservation with showering.     Follow Up Recommendations Outpatient PT;Supervision - Intermittent    Equipment Recommendations  3in1 (PT)    Recommendations for Other Services       Precautions / Restrictions Precautions Precaution Comments: Protective Precautions Restrictions Other  Position/Activity Restrictions: NPO      Mobility  Bed Mobility Overal bed mobility: Modified Independent             General bed mobility comments: Increased time/effort    Transfers Overall transfer level: Needs assistance   Transfers: Sit to/from Stand Sit to Stand: Supervision         General transfer comment: Supervision for safety to perform STS from EOB without AD  Ambulation/Gait Ambulation/Gait assistance: Supervision Gait Distance (Feet): 90 Feet Assistive device: None       General Gait Details: Supervision for safety and line management to ambulate without AD in room. Pt demonstrates narrow BOS, slowed cadence, decreased step length/foot clearance bil, and decreased reciprocal arm swing. Mild unsteadiness noted with gait.    Balance Overall balance assessment: Needs assistance Sitting-balance support: No upper extremity supported;Feet supported Sitting balance-Leahy Scale: Normal     Standing balance support: No upper extremity supported;During functional activity Standing balance-Leahy Scale: Good Standing balance comment: Mild imbalance noted during gait                             Pertinent Vitals/Pain Pain Assessment: No/denies pain (pt pre-medicated)    Home Living Family/patient expects to be discharged to:: Private residence Living Arrangements: Children Available Help at Discharge: Family;Available PRN/intermittently Type of Home: House Home Access: Stairs to enter Entrance Stairs-Rails: Can reach both Entrance Stairs-Number of Steps: 4 STE back entrace with bil railing Home Layout: One level Home Equipment: None      Prior Function Level of Independence: Independent         Comments: Pt reports being Ind with all ADL's, driving, working full time at a body  shop, and community ambulation without AD. Pt states that daugther assists with IADL's.    Extremity/Trunk Assessment   Upper Extremity Assessment Upper  Extremity Assessment: Overall WFL for tasks assessed    Lower Extremity Assessment Lower Extremity Assessment: Overall WFL for tasks assessed    Cervical / Trunk Assessment Cervical / Trunk Assessment: Normal  Communication      Cognition Arousal/Alertness: Awake/alert Behavior During Therapy: WFL for tasks assessed/performed Overall Cognitive Status: Within Functional Limits for tasks assessed                                 General Comments: Pt A&O x4 and able to follow 100% of multistep commands         Exercises Other Exercises Other Exercises: Pt able to participate in bed mobility, transfers, and gait without AD. Pt required grossly mod I - supervision during mobility. Pt able to balance for 30sec with all components of 4SBT, but increased ankle righting reaction noted with tandem stance and RLE SLS. Other Exercises: Pt educated regarding: PT role/POC, DC recommendations, safety with mobility, and prognosis for return to baseline function. He verbalized understanding of all education.   Assessment/Plan    PT Assessment Patient needs continued PT services  PT Problem List Decreased activity tolerance;Cardiopulmonary status limiting activity       PT Treatment Interventions Gait training;Stair training;Functional mobility training;Therapeutic activities;Therapeutic exercise;Balance training;Neuromuscular re-education    PT Goals (Current goals can be found in the Care Plan section)  Acute Rehab PT Goals Patient Stated Goal: "to get back to normal" PT Goal Formulation: With patient Time For Goal Achievement: 07/27/20 Potential to Achieve Goals: Good Additional Goals Additional Goal #1: Pt will be able to tolerate at least 15 minutes to continuous exercise reporting RPE of </= 2-3/10 indicating "light activity" to improve overall safety with functional mobility and return to baseline function.    Frequency Min 2X/week    AM-PAC PT "6 Clicks" Mobility   Outcome Measure Help needed turning from your back to your side while in a flat bed without using bedrails?: None Help needed moving from lying on your back to sitting on the side of a flat bed without using bedrails?: None Help needed moving to and from a bed to a chair (including a wheelchair)?: A Little Help needed standing up from a chair using your arms (e.g., wheelchair or bedside chair)?: A Little Help needed to walk in hospital room?: A Little Help needed climbing 3-5 steps with a railing? : A Little 6 Click Score: 20    End of Session Equipment Utilized During Treatment: Gait belt Activity Tolerance: Patient tolerated treatment well Patient left: in bed;with call bell/phone within reach Nurse Communication: Mobility status PT Visit Diagnosis: Unsteadiness on feet (R26.81)    Time: 0175-1025 PT Time Calculation (min) (ACUTE ONLY): 24 min   Charges:   PT Evaluation $PT Eval Low Complexity: 1 Low PT Treatments $Therapeutic Activity: 8-22 mins        Herminio Commons, PT, DPT 12:25 PM,07/13/20

## 2020-07-14 DIAGNOSIS — E43 Unspecified severe protein-calorie malnutrition: Secondary | ICD-10-CM | POA: Insufficient documentation

## 2020-07-14 LAB — BASIC METABOLIC PANEL
Anion gap: 8 (ref 5–15)
BUN: 13 mg/dL (ref 6–20)
CO2: 23 mmol/L (ref 22–32)
Calcium: 8.6 mg/dL — ABNORMAL LOW (ref 8.9–10.3)
Chloride: 100 mmol/L (ref 98–111)
Creatinine, Ser: 0.82 mg/dL (ref 0.61–1.24)
GFR, Estimated: >60 mL/min (ref >60)
Glucose, Bld: 101 mg/dL — ABNORMAL HIGH (ref 70–99)
Potassium: 3.3 mmol/L — ABNORMAL LOW (ref 3.5–5.1)
Sodium: 131 mmol/L — ABNORMAL LOW (ref 135–145)

## 2020-07-14 LAB — MAGNESIUM: Magnesium: 2 mg/dL (ref 1.7–2.4)

## 2020-07-14 LAB — PATHOLOGIST SMEAR REVIEW

## 2020-07-14 MED ORDER — POTASSIUM CHLORIDE CRYS ER 20 MEQ PO TBCR
40.0000 meq | EXTENDED_RELEASE_TABLET | Freq: Once | ORAL | Status: AC
  Administered 2020-07-14: 14:00:00 40 meq via ORAL
  Filled 2020-07-14: qty 2

## 2020-07-14 MED ORDER — ENSURE ENLIVE PO LIQD
237.0000 mL | Freq: Three times a day (TID) | ORAL | Status: DC
  Administered 2020-07-14: 237 mL via ORAL

## 2020-07-14 MED ORDER — BLISTEX MEDICATED EX OINT
TOPICAL_OINTMENT | CUTANEOUS | Status: DC | PRN
  Filled 2020-07-14: qty 6.3

## 2020-07-14 MED ORDER — LIP MEDEX EX OINT
TOPICAL_OINTMENT | CUTANEOUS | Status: DC | PRN
  Filled 2020-07-14: qty 7

## 2020-07-14 NOTE — Progress Notes (Signed)
Hematology/Oncology Consult note [facility]  [phone] [phone]  Patient Care Team: [name], MD as PCP - General (Internal Medicine) Sindy Guadeloupe, MD as Consulting Physician (Hematology and Oncology)   Name of the patient: Christopher Mills  [id number]  Aug 14, 1960   Date of visit: 07/14/2020   Interval history-remains afebrile overnight.  Denies any diarrhea.  Last bowel movement was yesterday.  Still reports some ongoing fatigue.  Appetite is still down.  ECOG PS- 1 Pain scale- 0   Review of systems- Review of Systems  Constitutional: Positive for malaise/fatigue. Negative for chills, fever and weight loss.  HENT: Negative for congestion, ear discharge and nosebleeds.   Eyes: Negative for blurred vision.  Respiratory: Negative for cough, hemoptysis, sputum production, shortness of breath and wheezing.   Cardiovascular: Negative for chest pain, palpitations, orthopnea and claudication.  Gastrointestinal: Negative for abdominal pain, blood in stool, constipation, diarrhea, heartburn, melena, nausea and vomiting.  Genitourinary: Negative for dysuria, flank pain, frequency, hematuria and urgency.  Musculoskeletal: Negative for back pain, joint pain and myalgias.  Skin: Negative for rash.  Neurological: Negative for dizziness, tingling, focal weakness, seizures, weakness and headaches.  Endo/Heme/Allergies: Does not bruise/bleed easily.  Psychiatric/Behavioral: Negative for depression and suicidal ideas. The patient does not have insomnia.       No Known Allergies   Past Medical History:  Diagnosis Date  . Acute kidney injury ([place]) 07/12/2020  . Dyspnea   . Hypertension   . 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 BONE MARROW BIOPSY    . COLONOSCOPY    . EXCISION MASS NECK Right 06/12/2020   Procedure: EXCISION MASS NECK;  Surgeon: Jules Husbands, MD;   Location: [facility] ORS;  Service: General;  Laterality: Right;  . HERNIA REPAIR Right    at age 67-RIH  . PORTA CATH INSERTION    . PORTACATH PLACEMENT Right 06/24/2020   Procedure: INSERTION PORT-A-CATH;  Surgeon: Jules Husbands, MD;  Location: [facility] ORS;  Service: General;  Laterality: Right;    Social History   Socioeconomic History  . Marital status: Single    Spouse name: Not on file  . Number of children: Not on file  . Years of education: Not on file  . Highest education level: Not on file  Occupational History  . Not on file  Tobacco Use  . Smoking status: Former Smoker    Types: Cigarettes    Quit date: 06/13/2020    Years since quitting: 0.0  . Smokeless tobacco: Never Used  Vaping Use  . Vaping Use: Never used  Substance and Sexual Activity  . Alcohol use: Not Currently  . Drug use: Not Currently    Types: Marijuana  . Sexual activity: Not Currently  Other Topics Concern  . Not on file  Social History Narrative   Has daughter and grandson in the home   Social Determinants of Health   Financial Resource Strain: Not on file  Food Insecurity: Not on file  Transportation Needs: Not on file  Physical Activity: Not on file  Stress: Not on file  Social Connections: Not on file  Intimate Partner Violence: Not on file    Family History  Problem Relation Age of Onset  . Anemia Mother   . Hypertension Mother   . Goiter Mother   . Cancer Father   . Cancer Sister   . Multiple sclerosis  Sister      Current Facility-Administered Medications:  .  acetaminophen (TYLENOL) tablet 650 mg, 650 mg, Oral, Q6H PRN **OR** acetaminophen (TYLENOL) suppository 650 mg, 650 mg, Rectal, Q6H PRN, Howerter, Justin B, DO .  ceFEPIme (MAXIPIME) 2 g in sodium chloride 0.9 % 100 mL IVPB, 2 g, Intravenous, Q8H, Benita Gutter, RPH, Last Rate: 200 mL/hr at 07/14/20 0929, 2 g at 07/14/20 0929 .  Chlorhexidine Gluconate Cloth 2 % PADS 6 each, 6 each, Topical, Daily, Enzo Bi, MD, 6 each at  07/14/20 1042 .  feeding supplement (ENSURE ENLIVE / ENSURE PLUS) liquid 237 mL, 237 mL, Oral, TID BM, Enzo Bi, MD, 237 mL at 07/14/20 1415 .  metroNIDAZOLE (FLAGYL) IVPB 500 mg, 500 mg, Intravenous, Q8H, Howerter, Justin B, DO, Last Rate: 100 mL/hr at 07/14/20 1418, 500 mg at 07/14/20 1418 .  ondansetron (ZOFRAN) injection 4 mg, 4 mg, Intravenous, Q6H PRN, Howerter, Justin B, DO .  zolpidem (AMBIEN) tablet 5 mg, 5 mg, Oral, QHS PRN, Athena Masse, MD, 5 mg at 07/13/20 2221  Facility-Administered Medications Ordered in Other Encounters:  .  acetaminophen (TYLENOL) tablet 1,000 mg, 1,000 mg, Oral, Q6H PRN, Jennette Banker, MD  Physical exam:  Vitals:   07/14/20 0500 07/14/20 0910 07/14/20 0912 07/14/20 1208  BP:  (!) 86/58 98/71 115/76  Pulse:  84 93 90  Resp:  17  17  Temp:  98 F (36.7 C)  98.3 F (36.8 C)  TempSrc:      SpO2:  100%  100%  Weight: 176 lb 12.9 oz (80.2 kg)     Height:       Physical Exam Constitutional:      General: He is not in acute distress. Cardiovascular:     Rate and Rhythm: Normal rate and regular rhythm.     Heart sounds: Normal heart sounds.  Pulmonary:     Effort: Pulmonary effort is normal.     Breath sounds: Normal breath sounds.  Abdominal:     General: Bowel sounds are normal.     Palpations: Abdomen is soft.  Skin:    General: Skin is warm and dry.  Neurological:     Mental Status: He is alert and oriented to person, place, and time.      CMP Latest Ref Rng & Units 07/14/2020  Glucose 70 - 99 mg/dL 101(H)  BUN 6 - 20 mg/dL 13  Creatinine 0.61 - 1.24 mg/dL 0.82  Sodium 135 - 145 mmol/L 131(L)  Potassium 3.5 - 5.1 mmol/L 3.3(L)  Chloride 98 - 111 mmol/L 100  CO2 22 - 32 mmol/L 23  Calcium 8.9 - 10.3 mg/dL 8.6(L)  Total Protein 6.5 - 8.1 g/dL -  Total Bilirubin 0.3 - 1.2 mg/dL -  Alkaline Phos 38 - 126 U/L -  AST 15 - 41 U/L -  ALT 0 - 44 U/L -   CBC Latest Ref Rng & Units 07/14/2020  WBC 4.0 - 10.5 K/uL 0.7(LL)  Hemoglobin 13.0  - 17.0 g/dL 9.7(L)  Hematocrit 39.0 - 52.0 % 27.4(L)  Platelets 150 - 400 K/uL 51(L)    '@IMAGES' @  DG Chest 2 View  Result Date: 07/12/2020 CLINICAL DATA:  60 year old male with weakness. Currently undergoing treatment for lymphoma. EXAM: CHEST - 2 VIEW COMPARISON:  06/24/2020 and prior chest radiographs FINDINGS: The cardiomediastinal silhouette is unchanged. Slightly prominent SUPERIOR mediastinum again noted. A RIGHT IJ Port-A-Cath is noted with tip overlying the mid SVC. There is no evidence of focal  airspace disease, pulmonary edema, suspicious pulmonary nodule/mass, pleural effusion, or pneumothorax. No acute bony abnormalities are identified. IMPRESSION: No acute cardiopulmonary disease. Electronically Signed   By: Margarette Canada M.D.   On: 07/12/2020 09:57   NM Cardiac Muga Rest  Result Date: 07/01/2020 CLINICAL DATA:  High-grade B-cell lymphoma, pre cardiotoxic chemotherapy EXAM: NUCLEAR MEDICINE CARDIAC BLOOD POOL IMAGING (MUGA) TECHNIQUE: Cardiac multi-gated acquisition was performed at rest following intravenous injection of Tc-1mlabeled red blood cells. RADIOPHARMACEUTICALS:  21.059 mCi Tc-975mertechnetate in-vitro labeled red blood cells IV COMPARISON:  None FINDINGS: Calculated LEFT ventricular ejection fraction is 54.4%, normal. Study was obtained at a cardiac rate of 56 bpm. Patient was rhythmic during imaging. Cine analysis of the LEFT ventricle in 3 projections demonstrates normal LV wall motion. IMPRESSION: Normal LEFT ventricular ejection fraction of 54.4%. Normal LEFT ventricular wall motion. Electronically Signed   By: MaLavonia Dana.D.   On: 07/01/2020 15:16   CT ABDOMEN PELVIS W CONTRAST  Result Date: 07/12/2020 CLINICAL DATA:  Abdominal pain and fever.  Nausea and diarrhea. EXAM: CT ABDOMEN AND PELVIS WITH CONTRAST TECHNIQUE: Multidetector CT imaging of the abdomen and pelvis was performed using the standard protocol following bolus administration of intravenous contrast.  CONTRAST:  1006mMNIPAQUE IOHEXOL 300 MG/ML  SOLN COMPARISON:  PET CT dated 05/26/2020. FINDINGS: Lower chest: No acute abnormality. Hepatobiliary: No focal liver abnormality is seen. Gallbladder is unremarkable. No bile duct dilatation. Pancreas: Unremarkable. No pancreatic ductal dilatation or surrounding inflammatory changes. Spleen: Multiple hypodense masses within the spleen, largest measuring 2.8 cm, FDG avid on recent PET-CT indicating lymphoproliferative disorder and presumably related to the recent diagnosis of lymphoma. Adrenals/Urinary Tract: Adrenal glands appear normal. Kidneys are unremarkable without suspicious mass, stone or hydronephrosis. No perinephric fluid. No ureteral or bladder calculi are identified. Bladder is unremarkable, partially decompressed. Stomach/Bowel: Thickening of the walls of the majority of the colon. Additional thickening and enhancement of the walls of multiple segments of small bowel, most prominent within the jejunum and ileum. Fluid is seen throughout the small bowel, with associated air-fluid levels. Stomach is unremarkable.  Appendix is normal in caliber. Vascular/Lymphatic: Aortic atherosclerosis. No acute appearing vascular abnormality. The intra-abdominal intrapelvic lymphadenopathy demonstrated on recent PET-CT has significantly improved in the interval. Representative lymphadenopathy at the RIGHT pelvic sidewall previously measured 3.5 cm greatest thickness, now measuring 1.6 cm greatest thickness. Lymph node within the LEFT periaortic retroperitoneum previously measured 1.3 cm short axis dimension, now measuring 0.7 cm short axis. Reproductive: Prostate is unremarkable. Other: No free fluid or abscess collection is seen. No free intraperitoneal air. Musculoskeletal: No acute appearing osseous abnormality. IMPRESSION: 1. Significant thickening and enhancement of the walls of the majority of the colon and multiple segments of small bowel, most prominent within the  jejunum and ileum. Differential includes diffuse enterocolitis of infectious or inflammatory nature, typhlitis, radiation induced enterocolitis (if corresponding history of radiation) and graft-versus-host disease (if any corresponding history of bone marrow transplant). 2. The intra-abdominal intrapelvic lymphadenopathy demonstrated on recent PET-CT has significantly improved in the interval indicating a good response to interval treatment. Measurements provided above. 3. Multiple hypodense masses within the spleen, largest measuring 2.8 cm, FDG avid on recent PET-CT indicating lymphoproliferative disorder and also presumably related to the recent diagnosis of lymphoma. Aortic Atherosclerosis (ICD10-I70.0). Electronically Signed   By: StaFranki CabotD.   On: 07/12/2020 12:32   DG CHEST PORT 1 VIEW  Result Date: 06/24/2020 CLINICAL DATA:  Status post port placement EXAM: PORTABLE CHEST 1  VIEW COMPARISON:  10/21/2019 FINDINGS: Right-sided chest wall port is noted. Catheter tip is seen in the mid superior vena cava in satisfactory position. No pneumothorax is noted. Persistent fullness in the right paratracheal space is noted consistent with the patient's known history. Cardiac shadow is within normal limits. Mild bibasilar atelectasis is noted related to a poor inspiratory effort. No bony abnormality is seen. IMPRESSION: No pneumothorax following port placement. Electronically Signed   By: Inez Catalina M.D.   On: 06/24/2020 11:28   CT BONE MARROW BIOPSY & ASPIRATION  Result Date: 06/25/2020 INDICATION: New diagnosis of lymphoma. Please perform CT-guided bone marrow biopsy for tissue diagnostic purposes. EXAM: CT-GUIDED BONE MARROW BIOPSY AND ASPIRATION MEDICATIONS: None ANESTHESIA/SEDATION: Fentanyl 50 mcg IV; Versed 1 mg IV Sedation Time: 10 Minutes; The patient was continuously monitored during the procedure by the interventional radiology nurse under my direct supervision. COMPLICATIONS: None immediate.  PROCEDURE: Informed consent was obtained from the patient following an explanation of the procedure, risks, benefits and alternatives. The patient understands, agrees and consents for the procedure. All questions were addressed. A time out was performed prior to the initiation of the procedure. The patient was positioned prone and non-contrast localization CT was performed of the pelvis to demonstrate the iliac marrow spaces. The operative site was prepped and draped in the usual sterile fashion. Under sterile conditions and local anesthesia, a 22 gauge spinal needle was utilized for procedural planning. Next, an 11 gauge coaxial bone biopsy needle was advanced into the left iliac marrow space. Needle position was confirmed with CT imaging. Initially, a bone marrow aspiration was performed. Next, a bone marrow biopsy was obtained with the 11 gauge outer bone marrow device. Samples were prepared with the cytotechnologist and deemed adequate. The needle was removed and superficial hemostasis was obtained with manual compression. A dressing was applied. The patient tolerated the procedure well without immediate post procedural complication. IMPRESSION: Successful CT guided left iliac bone marrow aspiration and core biopsy. Electronically Signed   By: Sandi Mariscal M.D.   On: 06/25/2020 10:30   DG C-Arm 1-60 Min-No Report  Result Date: 06/24/2020 Fluoroscopy was utilized by the requesting physician.  No radiographic interpretation.   DG FLUORO GUIDED LOC OF NEEDLE/CATH TIP FOR SPINAL INJECT LT  Result Date: 07/08/2020 CLINICAL DATA:  Non-Hodgkin's lymphoma EXAM: DIAGNOSTIC LUMBAR PUNCTURE UNDER FLUOROSCOPIC GUIDANCE COMPARISON:  None FLUOROSCOPY TIME:  Fluoroscopy Time:  30 seconds Radiation Exposure Index (if provided by the fluoroscopic device): 4.9 mGy Number of Acquired Spot Images: 0 PROCEDURE: Informed consent was obtained from the patient prior to the procedure, including potential complications of  headache, allergy, and pain. With the patient prone, the lower back was prepped with Betadine. 1% Lidocaine was used for local anesthesia. Lumbar puncture was performed at the L4-5 level using a 22 gauge needle with return of clear 5 ml of CSF were obtained for laboratory studies. Subsequently, 12 mg of methotrexate in 5 mL was hand injected into the thecal sac. The patient tolerated the procedure well and there were no apparent complications. IMPRESSION: Successful fluoroscopic guided lumbar puncture for methotrexate injection. Electronically Signed   By: Kathreen Devoid   On: 07/08/2020 13:54     Assessment and plan- Patient is a 60 y.o. 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  Patient states that he did get his on pro Neulasta without any problems and the patch had changed color indicating that he did receive the drug.CBC currently shows white count is 0.7 which is the same as what he was a day ago but lower as compared to 2 days prior.  Platelets are truly trending down from 85 2 days prior to 51 today.  No indication of giving any more Neupogen or Neulasta at this time.  Continue to monitor CBC with differential daily.  Transfuse if platelet count less than 15 or hemoglobin less than 7.  Patient was ideally supposed to receive R-EPOCH chemotherapy and did not receive etoposide for cycle 1 due to urgency to start treatment.  He is supposed to be getting his second treatment on 07/28/2020 but it is unclear if he is going to be able to start chemotherapy on time given his present episode of enterocolitis and prolonged neutropenia.  CT abdomen does show interim response to treatment with 1 cycle of  chemotherapy.  Salmonella enterocolitis: Currently patient is on Flagyl and cefepime.  ID was consulted and they recommend continuing IV antibiotics until Dotsero is greater than 1 followed by switching to oral fluoroquinolone for a total treatment of 14 days.  Patient remains hemodynamically stable and bowel exam today is unremarkable.  No bowel movement since yesterday.  Continue to monitor     Visit Diagnosis 1. Diarrhea, unspecified type   2. Sepsis, due to unspecified organism, unspecified whether acute organ dysfunction present ([place])   3. Neutropenic fever ([place])      Dr. Randa Evens, MD, MPH [facility] at [facility] [phone] 07/14/2020 5:00 PM

## 2020-07-14 NOTE — Progress Notes (Signed)
PT Cancellation Note  Patient Details Name: Christopher Mills MRN: [id number] DOB: April 15, 1960   Cancelled Treatment:    Reason Eval/Treat Not Completed: Other (comment)   Pt in bed.  Stating he has been up and is moving well in room on his one.  Declined session today.  Will continue to offer as appropriate.   Chesley Noon 07/14/2020, 11:51 AM

## 2020-07-14 NOTE — Progress Notes (Addendum)
PROGRESS NOTE    Christopher Mills  [id number] DOB: 12-11-1960 DOA: 07/12/2020 PCP: [name], MD  106A/106A-AA   Assessment & Plan:   Principal Problem:   Neutropenic fever ([place]) Active Problems:   Diarrhea   Enterocolitis   Acute hyponatremia   Abdominal pain   Acute kidney injury ([place])   Thrombocytopenia ([place])   Generalized weakness   Dehydration   Hypertension   Sepsis ([place])   Salmonella gastroenteritis   Protein-calorie malnutrition, severe   Christopher Mills is a 60 y.o. male with medical history significant for high-grade B-cell lymphoma undergoing chemotherapy, hypertension, who is admitted to [facility] on 07/12/2020 with neutropenic fever after presenting from home to [facility] ED complaining of generalized weakness and diarrhea.   #) Neutropenic fever, POA Salmonella gastroenteritis  --temperature of 100.7, with absolute neutrophil count 400.  This is in the context of an underlying history of high-grade B cell lymphoma for which the patient has been undergoing chemotherapy, with most recent infusion occurring 3 weeks ago per the patient.   --GI path pos for Salmonella --started on cefepime and flagyl on presentation Plan: --formal ID consult today --cont cefepime and flagyl, per ID --Once neutropenia resolved would change to oral quinolone. --Would plan on at least 14 days treatment given immunocompromised state.    Severe Sepsis, POA --fever, tachycardia, neutropenia with GI infection, with AKI.  #) Acute hypovolemic hyponatremia, improved --Presenting serum sodium found to be 128, improved with IVF. --d/c MIVF today --encourage oral hydration  #) Acute kidney injury, POA, resolved --Presenting creatinine found to be 1.35, baseline ~1.  Likely due to dehydration. --s/p IVF Plan: --d/c MIVF today --encourage oral hydration --Hold home lisinopril.    #) Thrombocytopenia:  Presenting CBC notable for platelet count of 85.  Suspect that this is a consequence of the patient's chemotherapy.   --Monitor for now  #) Generalized weakness --PT  #) Essential hypertension Outpatient antihypertensive regimen consists of lisinopril and atenolol.   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   --CT indicates positive treatment response.   --Follow-up with outpatient oncology  Severe malnutrition in the context of acute illness --nutrition consulted --will need an alternative supplement as pt appeared to be lactose intolerant   DVT prophylaxis: SCD/Compression stockings Code Status: Full code  Family Communication:  Level of care: Med-Surg Dispo:   The patient is from: home Anticipated d/c is to: home Anticipated d/c date is: 2-3 days Patient currently is not medically ready to d/c due to: neutropenic with active infection, need to be on broad spectrum until neutropenia resolves.   Subjective and Interval History:  Diarrhea stayed away.  Pt reported bloating after drinking Ensure.  No more fever.   Objective: Vitals:   07/14/20 0910 07/14/20 0912 07/14/20 1208 07/14/20 1732  BP: (!) 86/58 98/71 115/76 118/81  Pulse: 84 93 90 83  Resp: 17  17 15   Temp: 98 F (36.7 C)  98.3 F (36.8 C) 98 F (36.7 C)  TempSrc:    Oral  SpO2: 100%  100% 99%  Weight:      Height:        Intake/Output Summary (Last 24 hours) at 07/14/2020 1900 Last data filed at 07/14/2020 0414 Gross per 24 hour  Intake 811.9 ml  Output 775 ml  Net 36.9 ml  Filed Weights   07/12/20 0903 07/14/20 0500  Weight: 79.4 kg 80.2 kg    Examination:   Constitutional: NAD, AAOx3 HEENT: conjunctivae and lids normal, EOMI CV: No cyanosis.   RESP: normal respiratory effort, on RA Extremities: No effusions, edema in BLE SKIN: warm, dry Neuro: II - XII grossly intact.   Psych:  Normal mood and affect.  Appropriate judgement and reason   Data Reviewed: I have personally reviewed following labs and imaging studies  CBC: Recent Labs  Lab 07/10/20 0814 07/12/20 0911 07/13/20 0835 07/14/20 0458  WBC 1.1* 0.8* 0.7* 0.7*  NEUTROABS 0.2* 0.4* 0.3* 0.3*  HGB 13.4 12.5* 10.6* 9.7*  HCT 39.3 35.9* 30.2* 27.4*  MCV 85.4 84.3 84.6 83.5  PLT 79* 85* 59* 51*   Basic Metabolic Panel: Recent Labs  Lab 07/10/20 0814 07/12/20 0911 07/13/20 0835 07/14/20 0458  NA 134* 128* 133* 131*  K 3.7 4.1 3.7 3.3*  CL 99 96* 101 100  CO2 26 21* 22 23  GLUCOSE 105* 120* 91 101*  BUN 24* 26* 17 13  CREATININE 1.07 1.35* 0.97 0.82  CALCIUM 9.4 9.4 8.8* 8.6*  MG  --  2.1 2.1 2.0  PHOS  --   --  2.3*  --    GFR: Estimated Creatinine Clearance: 108.3 mL/min (by C-G formula based on SCr of 0.82 mg/dL). Liver Function Tests: Recent Labs  Lab 07/10/20 0814 07/12/20 0911 07/13/20 0835  AST 27 14* 21  ALT 37 25 26  ALKPHOS 88 73 60  BILITOT 0.9 1.3* 1.2  PROT 6.9 7.2 6.3*  ALBUMIN 3.9 3.6 2.9*   Recent Labs  Lab 07/12/20 0911  LIPASE 21   No results for input(s): AMMONIA in the last 168 hours. Coagulation Profile: Recent Labs  Lab 07/12/20 0911  INR 1.1   Cardiac Enzymes: No results for input(s): CKTOTAL, CKMB, CKMBINDEX, TROPONINI in the last 168 hours. BNP (last 3 results) No results for input(s): PROBNP in the last 8760 hours. HbA1C: No results for input(s): HGBA1C in the last 72 hours. CBG: No results for input(s): GLUCAP in the last 168 hours. Lipid Profile: No results for input(s): CHOL, HDL, LDLCALC, TRIG, CHOLHDL, LDLDIRECT in the last 72 hours. Thyroid Function Tests: Recent Labs    07/12/20 0911  TSH 0.467   Anemia Panel: No results for input(s): VITAMINB12, FOLATE, FERRITIN, TIBC, IRON, RETICCTPCT in the last 72 hours. Sepsis Labs: Recent Labs  Lab 07/12/20 0940 07/12/20 1113  LATICACIDVEN 1.5 1.2    Recent Results (from the past  240 hour(s))  Culture, blood (Routine x 2)     Status: None (Preliminary result)   Collection Time: 07/12/20  9:40 AM   Specimen: BLOOD  Result Value Ref Range Status   Specimen Description BLOOD LEFT ANTECUBITAL  Final   Special Requests   Final    BOTTLES DRAWN AEROBIC AND ANAEROBIC Blood Culture results may not be optimal due to an inadequate volume of blood received in culture bottles   Culture   Final    NO GROWTH 2 DAYS Performed at [facility], [address], [place], [place] [address]    Report Status PENDING  Incomplete  Culture, blood (Routine x 2)     Status: None (Preliminary result)   Collection Time: 07/12/20  9:40 AM   Specimen: BLOOD  Result Value Ref Range Status   Specimen Description BLOOD PART  Final   Special Requests   Final    BOTTLES DRAWN AEROBIC AND ANAEROBIC Blood  Culture adequate volume   Culture   Final    NO GROWTH 2 DAYS Performed at [facility], [address] [place], [place] [address]    Report Status PENDING  Incomplete  Resp Panel by RT-PCR (Flu A&B, Covid) Nasopharyngeal Swab     Status: None   Collection Time: 07/12/20 10:02 AM   Specimen: Nasopharyngeal Swab; Nasopharyngeal(NP) swabs in vial transport medium  Result Value Ref Range Status   SARS Coronavirus 2 by RT PCR NEGATIVE NEGATIVE Final    Comment: (NOTE) SARS-CoV-2 target nucleic acids are NOT DETECTED.  The SARS-CoV-2 RNA is generally detectable in upper respiratory specimens during the acute phase of infection. The lowest concentration of SARS-CoV-2 viral copies this assay can detect is 138 copies/mL. A negative result does not preclude SARS-Cov-2 infection and should not be used as the sole basis for treatment or other patient management decisions. A negative result may occur with  improper specimen collection/handling, submission of specimen other than nasopharyngeal swab, presence of viral mutation(s) within the areas targeted by this assay, and  inadequate number of viral copies(<138 copies/mL). A negative result must be combined with clinical observations, patient history, and epidemiological information. The expected result is Negative.  Fact Sheet for Patients:  EntrepreneurPulse.com.au  Fact Sheet for Healthcare Providers:  IncredibleEmployment.be  This test is no t yet approved or cleared by the [place] FDA and  has been authorized for detection and/or diagnosis of SARS-CoV-2 by FDA under an Emergency Use Authorization (EUA). This EUA will remain  in effect (meaning this test can be used) for the duration of the COVID-19 declaration under Section 564(b)(1) of the Act, 21 U.S.C.section 360bbb-3(b)(1), unless the authorization is terminated  or revoked sooner.       Influenza A by PCR NEGATIVE NEGATIVE Final   Influenza B by PCR NEGATIVE NEGATIVE Final    Comment: (NOTE) The Xpert Xpress SARS-CoV-2/FLU/RSV plus assay is intended as an aid in the diagnosis of influenza from Nasopharyngeal swab specimens and should not be used as a sole basis for treatment. Nasal washings and aspirates are unacceptable for Xpert Xpress SARS-CoV-2/FLU/RSV testing.  Fact Sheet for Patients: EntrepreneurPulse.com.au  Fact Sheet for Healthcare Providers: IncredibleEmployment.be  This test is not yet approved or cleared by the [place] FDA and has been authorized for detection and/or diagnosis of SARS-CoV-2 by FDA under an Emergency Use Authorization (EUA). This EUA will remain in effect (meaning this test can be used) for the duration of the COVID-19 declaration under Section 564(b)(1) of the Act, 21 U.S.C. section 360bbb-3(b)(1), unless the authorization is terminated or revoked.  Performed at [facility], [address] [place], [place] [address]   Gastrointestinal Panel by PCR , Stool     Status: Abnormal   Collection Time: 07/13/20   2:12 AM   Specimen: Stool  Result Value Ref Range Status   Campylobacter species NOT DETECTED NOT DETECTED Final   Plesimonas shigelloides NOT DETECTED NOT DETECTED Final   Salmonella species DETECTED (A) NOT DETECTED Final    Comment: RESULT CALLED TO, READ BACK BY AND VERIFIED WITH: ALEXIS STOGDEN ON 07/13/20 AT 0353 QSD    Yersinia enterocolitica NOT DETECTED NOT DETECTED Final   Vibrio species NOT DETECTED NOT DETECTED Final   Vibrio cholerae NOT DETECTED NOT DETECTED Final   Enteroaggregative E coli (EAEC) NOT DETECTED NOT DETECTED Final   Enteropathogenic E coli (EPEC) NOT DETECTED NOT DETECTED Final   Enterotoxigenic E coli (ETEC) NOT DETECTED NOT DETECTED Final  Shiga like toxin producing E coli (STEC) NOT DETECTED NOT DETECTED Final   Shigella/Enteroinvasive E coli (EIEC) NOT DETECTED NOT DETECTED Final   Cryptosporidium NOT DETECTED NOT DETECTED Final   Cyclospora cayetanensis NOT DETECTED NOT DETECTED Final   Entamoeba histolytica NOT DETECTED NOT DETECTED Final   Giardia lamblia NOT DETECTED NOT DETECTED Final   Adenovirus F40/41 NOT DETECTED NOT DETECTED Final   Astrovirus NOT DETECTED NOT DETECTED Final   Norovirus GI/GII NOT DETECTED NOT DETECTED Final   Rotavirus A NOT DETECTED NOT DETECTED Final   Sapovirus (I, II, IV, and V) NOT DETECTED NOT DETECTED Final    Comment: Performed at [facility], [address] [place], [place] [address]  C Difficile Quick Screen w PCR reflex     Status: None   Collection Time: 07/13/20  2:12 AM   Specimen: STOOL  Result Value Ref Range Status   C Diff antigen NEGATIVE NEGATIVE Final   C Diff toxin NEGATIVE NEGATIVE Final   C Diff interpretation No C. difficile detected.  Final    Comment: Performed at [facility], [address], [place], [place] [address]      Radiology Studies: No results found.   Scheduled Meds: . Chlorhexidine Gluconate Cloth  6 each Topical Daily  . feeding supplement  237 mL Oral  TID BM   Continuous Infusions: . ceFEPime (MAXIPIME) IV 2 g (07/14/20 1758)  . metronidazole 500 mg (07/14/20 1418)     LOS: 2 days     Enzo Bi, MD [facility] If 7PM-7AM, please contact night-coverage 07/14/2020, 7:00 PM

## 2020-07-14 NOTE — Consult Note (Signed)
Infectious Disease     Reason for Consult:Salmonella enteritis     Referring Physician: Dr Billie Ruddy Date of Admission:  07/12/2020   Principal Problem:   Neutropenic fever ([place]) Active Problems:   Diarrhea   Enterocolitis   Acute hyponatremia   Abdominal pain   Acute kidney injury ([place])   Thrombocytopenia (H[facility])   Generalized weakness   Dehydration   Hypertension   Sepsis ([place])   Salmonella gastroenteritis   HPI: Christopher Mills is a 60 y.o. 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  He was also having episodes of diarrhea.  He had a stool PCR done which was positive for Salmonella.  Blood cultures have been no growth to date.  CT of the abdomen did show thickening and enhancement of the colon and multiple segments of the small bowel.  There was improvement of intra-abdominal intrapelvic lymphadenopathy compared to recent CT. Of note he does have a port in place. Since admission he is defervesced.  White count remains low and ANC  is 0.3. Reports diarrhea has slowed down.  Feeling stronger.   Past Medical History:  Diagnosis Date  . Acute kidney injury ([place]) 07/12/2020  . Dyspnea   . Hypertension   . 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 BONE MARROW BIOPSY    . COLONOSCOPY    . EXCISION MASS NECK Right 06/12/2020   Procedure: EXCISION MASS NECK;  Surgeon: Jules Husbands, MD;  Location: [facility] ORS;  Service: General;  Laterality: Right;  . HERNIA REPAIR Right    at age 61-RIH  . PORTA CATH INSERTION    . PORTACATH PLACEMENT Right 06/24/2020   Procedure: INSERTION PORT-A-CATH;  Surgeon: Jules Husbands, MD;  Location: [facility] ORS;  Service: General;  Laterality: Right;   Social History   Tobacco Use  . Smoking status: Former Smoker    Types: Cigarettes    Quit date: 06/13/2020    Years since  quitting: 0.0  . Smokeless tobacco: Never Used  Vaping Use  . Vaping Use: Never used  Substance Use Topics  . Alcohol use: Not Currently  . Drug use: Not Currently    Types: Marijuana   Family History  Problem Relation Age of Onset  . Anemia Mother   . Hypertension Mother   . Goiter Mother   . Cancer Father   . Cancer Sister   . Multiple sclerosis Sister     Allergies: No Known Allergies  Current antibiotics: Antibiotics Given (last 72 hours)    Date/Time Action Medication Dose Rate   07/12/20 0951 New Bag/Given   ceFEPIme (MAXIPIME) 2 g in sodium chloride 0.9 % 100 mL IVPB 2 g 200 mL/hr   07/12/20 0952 New Bag/Given   metroNIDAZOLE (FLAGYL) IVPB 500 mg 500 mg 100 mL/hr   07/12/20 1031 New Bag/Given   vancomycin (VANCOCIN) IVPB 1000 mg/200 mL premix 1,000 mg 200 mL/hr   07/12/20 1746 New Bag/Given   metroNIDAZOLE (FLAGYL) IVPB 500 mg 500 mg 100 mL/hr   07/12/20 1754 New Bag/Given   ceFEPIme (MAXIPIME) 2 g in sodium chloride 0.9 % 100 mL IVPB 2 g 200 mL/hr   07/13/20 0211 New Bag/Given   ceFEPIme (MAXIPIME) 2 g in sodium chloride 0.9 % 100 mL IVPB 2 g 200 mL/hr   07/13/20 0455 New Bag/Given  metroNIDAZOLE (FLAGYL) IVPB 500 mg 500 mg 100 mL/hr   07/13/20 0834 New Bag/Given   ceFEPIme (MAXIPIME) 2 g in sodium chloride 0.9 % 100 mL IVPB 2 g 200 mL/hr   07/13/20 1637 New Bag/Given   metroNIDAZOLE (FLAGYL) IVPB 500 mg 500 mg 100 mL/hr   07/13/20 1911 New Bag/Given   ceFEPIme (MAXIPIME) 2 g in sodium chloride 0.9 % 100 mL IVPB 2 g 200 mL/hr   07/13/20 2221 New Bag/Given   metroNIDAZOLE (FLAGYL) IVPB 500 mg 500 mg 100 mL/hr   07/14/20 0208 New Bag/Given   ceFEPIme (MAXIPIME) 2 g in sodium chloride 0.9 % 100 mL IVPB 2 g 200 mL/hr   07/14/20 0510 New Bag/Given   metroNIDAZOLE (FLAGYL) IVPB 500 mg 500 mg 100 mL/hr   07/14/20 0677 New Bag/Given   ceFEPIme (MAXIPIME) 2 g in sodium chloride 0.9 % 100 mL IVPB 2 g 200 mL/hr      MEDICATIONS: . Chlorhexidine Gluconate Cloth  6  each Topical Daily  . feeding supplement  237 mL Oral TID BM  . potassium chloride  40 mEq Oral Once    Review of Systems - 11 systems reviewed and negative per HPI   OBJECTIVE: Temp:  [97.7 F (36.5 C)-99 F (37.2 C)] 98.3 F (36.8 C) (05/02 1208) Pulse Rate:  [78-94] 90 (05/02 1208) Resp:  [16-17] 17 (05/02 1208) BP: (86-121)/(52-80) 115/76 (05/02 1208) SpO2:  [98 %-100 %] 100 % (05/02 1208) Weight:  [80.2 kg] 80.2 kg (05/02 0500) Physical Exam  Constitutional: He is oriented to person, place, and time. He appears well-developed and well-nourished. No distress.  HENT:  Mouth/Throat: Oropharynx is clear and moist. No oropharyngeal exudate.  Cardiovascular: Normal rate, regular rhythm and normal heart sounds. Exam reveals no gallop and no friction rub.  No murmur heard.  Pulmonary/Chest: Effort normal and breath sounds normal. No respiratory distress. He has no wheezes.  Abdominal: Soft mild distention, mild ttp Lymphadenopathy:  He has no cervical adenopathy.  Neurological: He is alert and oriented to person, place, and time.  Skin: Skin is warm and dry. No rash noted. No erythema.  Psychiatric: He has a normal mood and affect. His behavior is normal.   LABS: Results for orders placed or performed during the hospital encounter of 07/12/20 (from the past 48 hour(s))  Osmolality     Status: Abnormal   Collection Time: 07/12/20  3:58 PM  Result Value Ref Range   Osmolality 274 (L) 275 - 295 mOsm/kg    Comment: Performed at [facility], [address] [place], [place] [address]  Gastrointestinal Panel by PCR , Stool     Status: Abnormal   Collection Time: 07/13/20  2:12 AM   Specimen: Stool  Result Value Ref Range   Campylobacter species NOT DETECTED NOT DETECTED   Plesimonas shigelloides NOT DETECTED NOT DETECTED   Salmonella species DETECTED (A) NOT DETECTED    Comment: RESULT CALLED TO, READ BACK BY AND VERIFIED WITH: ALEXIS STOGDEN ON 07/13/20 AT 0353 QSD     Yersinia enterocolitica NOT DETECTED NOT DETECTED   Vibrio species NOT DETECTED NOT DETECTED   Vibrio cholerae NOT DETECTED NOT DETECTED   Enteroaggregative E coli (EAEC) NOT DETECTED NOT DETECTED   Enteropathogenic E coli (EPEC) NOT DETECTED NOT DETECTED   Enterotoxigenic E coli (ETEC) NOT DETECTED NOT DETECTED   Shiga like toxin producing E coli (STEC) NOT DETECTED NOT DETECTED   Shigella/Enteroinvasive E coli (EIEC) NOT DETECTED NOT DETECTED   Cryptosporidium NOT  DETECTED NOT DETECTED   Cyclospora cayetanensis NOT DETECTED NOT DETECTED   Entamoeba histolytica NOT DETECTED NOT DETECTED   Giardia lamblia NOT DETECTED NOT DETECTED   Adenovirus F40/41 NOT DETECTED NOT DETECTED   Astrovirus NOT DETECTED NOT DETECTED   Norovirus GI/GII NOT DETECTED NOT DETECTED   Rotavirus A NOT DETECTED NOT DETECTED   Sapovirus (I, II, IV, and V) NOT DETECTED NOT DETECTED    Comment: Performed at [facility], [address], [place], [place] [address]  C Difficile Quick Screen w PCR reflex     Status: None   Collection Time: 07/13/20  2:12 AM   Specimen: STOOL  Result Value Ref Range   C Diff antigen NEGATIVE NEGATIVE   C Diff toxin NEGATIVE NEGATIVE   C Diff interpretation No C. difficile detected.     Comment: Performed at [facility], [address] [place], [place] [address]  Phosphorus     Status: Abnormal   Collection Time: 07/13/20  8:35 AM  Result Value Ref Range   Phosphorus 2.3 (L) 2.5 - 4.6 mg/dL    Comment: Performed at [facility], [address] [place], [place] [address]  Magnesium     Status: None   Collection Time: 07/13/20  8:35 AM  Result Value Ref Range   Magnesium 2.1 1.7 - 2.4 mg/dL    Comment: Performed at [facility], [address] [place], [place] [address]  Comprehensive metabolic panel     Status: Abnormal   Collection Time: 07/13/20  8:35 AM  Result Value Ref Range   Sodium 133 (L) 135 - 145 mmol/L   Potassium 3.7 3.5 -  5.1 mmol/L   Chloride 101 98 - 111 mmol/L   CO2 22 22 - 32 mmol/L   Glucose, Bld 91 70 - 99 mg/dL    Comment: Glucose reference range applies only to samples taken after fasting for at least 8 hours.   BUN 17 6 - 20 mg/dL   Creatinine, Ser 0.97 0.61 - 1.24 mg/dL   Calcium 8.8 (L) 8.9 - 10.3 mg/dL   Total Protein 6.3 (L) 6.5 - 8.1 g/dL   Albumin 2.9 (L) 3.5 - 5.0 g/dL   AST 21 15 - 41 U/L   ALT 26 0 - 44 U/L   Alkaline Phosphatase 60 38 - 126 U/L   Total Bilirubin 1.2 0.3 - 1.2 mg/dL   GFR, Estimated >60 >60 mL/min    Comment: (NOTE) Calculated using the CKD-EPI Creatinine Equation (2021)    Anion gap 10 5 - 15    Comment: Performed at [facility], [address] [place],  [address]  CBC with Differential/Platelet     Status: Abnormal   Collection Time: 07/13/20  8:35 AM  Result Value Ref Range   WBC 0.7 (LL) 4.0 - 10.5 K/uL    Comment: CRITICAL VALUE NOTED.  VALUE IS CONSISTENT WITH PREVIOUSLY REPORTED AND CALLED VALUE. REPEATED TO VERIFY    RBC 3.57 (L) 4.22 - 5.81 MIL/uL   Hemoglobin 10.6 (L) 13.0 - 17.0 g/dL   HCT 30.2 (L) 39.0 - 52.0 %   MCV 84.6 80.0 - 100.0 fL   MCH 29.7 26.0 - 34.0 pg   MCHC 35.1 30.0 - 36.0 g/dL   RDW 12.9 11.5 - 15.5 %   Platelets 59 (L) 150 - 400 K/uL    Comment: Immature Platelet Fraction may be clinically indicated, consider ordering this additional test [id number]    nRBC 0.0 0.0 -  0.2 %   Neutrophils Relative % 47 %   Neutro Abs 0.3 (LL) 1.7 - 7.7 K/uL    Comment: CRITICAL VALUE NOTED.  VALUE IS CONSISTENT WITH PREVIOUSLY REPORTED AND CALLED VALUE.   Lymphocytes Relative 44 %   Lymphs Abs 0.3 (L) 0.7 - 4.0 K/uL   Monocytes Relative 6 %   Monocytes Absolute 0.0 (L) 0.1 - 1.0 K/uL   Eosinophils Relative 1 %   Eosinophils Absolute 0.0 0.0 - 0.5 K/uL   Basophils Relative 1 %   Basophils Absolute 0.0 0.0 - 0.1 K/uL   WBC Morphology TOO FEW TO COUNT    RBC Morphology MORPHOLOGY UNREMARKABLE    Smear Review Normal platelet  morphology     Comment: PLATELETS APPEAR DECREASED   Immature Granulocytes 1 %   Abs Immature Granulocytes 0.01 0.00 - 0.07 K/uL   [place] PRESENT     Comment: Performed at [facility], [address], [place], [place] [address]  Pathologist smear review     Status: None   Collection Time: 07/13/20  8:35 AM  Result Value Ref Range   Path Review Blood smear is reviewed.     Comment: There is thrombocytopenia without clumping. RBCs are normochromic and normocytic. There is critical neutropenia, with absolute neutrophil count 300 per microL. No abnormal or immature leukocytes are identified. History of RCHOP with neulasta support on  April 21 is noted. The findings are consistent with chemotherapy effect. Reviewed by Lemmie Evens. Dicie Beam, MD. Performed at [facility], [address] [place], [place] [address]   Basic metabolic panel     Status: Abnormal   Collection Time: 07/14/20  4:58 AM  Result Value Ref Range   Sodium 131 (L) 135 - 145 mmol/L   Potassium 3.3 (L) 3.5 - 5.1 mmol/L   Chloride 100 98 - 111 mmol/L   CO2 23 22 - 32 mmol/L   Glucose, Bld 101 (H) 70 - 99 mg/dL    Comment: Glucose reference range applies only to samples taken after fasting for at least 8 hours.   BUN 13 6 - 20 mg/dL   Creatinine, Ser 0.82 0.61 - 1.24 mg/dL   Calcium 8.6 (L) 8.9 - 10.3 mg/dL   GFR, Estimated >60 >60 mL/min    Comment: (NOTE) Calculated using the CKD-EPI Creatinine Equation (2021)    Anion gap 8 5 - 15    Comment: Performed at [facility], [address] [place],  [address]  CBC with Differential/Platelet     Status: Abnormal   Collection Time: 07/14/20  4:58 AM  Result Value Ref Range   WBC 0.7 (LL) 4.0 - 10.5 K/uL    Comment: CRITICAL VALUE NOTED.  VALUE IS CONSISTENT WITH PREVIOUSLY REPORTED AND CALLED VALUE.   RBC 3.28 (L) 4.22 - 5.81 MIL/uL   Hemoglobin 9.7 (L) 13.0 - 17.0 g/dL   HCT 27.4 (L) 39.0 - 52.0 %   MCV 83.5 80.0 - 100.0 fL   MCH 29.6  26.0 - 34.0 pg   MCHC 35.4 30.0 - 36.0 g/dL   RDW 12.9 11.5 - 15.5 %   Platelets 51 (L) 150 - 400 K/uL    Comment: Immature Platelet Fraction may be clinically indicated, consider ordering this additional test [id number] CONSISTENT WITH PREVIOUS RESULT REPEATED TO VERIFY    nRBC 0.0 0.0 - 0.2 %   Neutrophils Relative % 39 %   Neutro Abs 0.3 (LL) 1.7 - 7.7 K/uL   Lymphocytes Relative 40 %  Lymphs Abs 0.3 (L) 0.7 - 4.0 K/uL   Monocytes Relative 8 %   Monocytes Absolute 0.1 0.1 - 1.0 K/uL   Eosinophils Relative 3 %   Eosinophils Absolute 0.0 0.0 - 0.5 K/uL   Basophils Relative 2 %   Basophils Absolute 0.0 0.0 - 0.1 K/uL   WBC Morphology TOO FEW TO COUNT    RBC Morphology MORPHOLOGY UNREMARKABLE    Smear Review PLATELETS APPEAR DECREASED    Immature Granulocytes 8 %   Abs Immature Granulocytes 0.05 0.00 - 0.07 K/uL    Comment: Performed at [facility], [address] [place], [place] [address]  Magnesium     Status: None   Collection Time: 07/14/20  4:58 AM  Result Value Ref Range   Magnesium 2.0 1.7 - 2.4 mg/dL    Comment: Performed at [facility], [address] [place], [place] [address]   No components found for: ESR, C REACTIVE PROTEIN MICRO: Recent Results (from the past 720 hour(s))  SARS CORONAVIRUS 2 (TAT 6-24 HRS) Nasopharyngeal Nasopharyngeal Swab     Status: None   Collection Time: 06/20/20 12:14 PM   Specimen: Nasopharyngeal Swab  Result Value Ref Range Status   SARS Coronavirus 2 NEGATIVE NEGATIVE Final    Comment: (NOTE) SARS-CoV-2 target nucleic acids are NOT DETECTED.  The SARS-CoV-2 RNA is generally detectable in upper and lower respiratory specimens during the acute phase of infection. Negative results do not preclude SARS-CoV-2 infection, do not rule out co-infections with other pathogens, and should not be used as the sole basis for treatment or other patient management decisions. Negative results must be combined with clinical  observations, patient history, and epidemiological information. The expected result is Negative.  Fact Sheet for Patients: SugarRoll.be  Fact Sheet for Healthcare Providers: https://www.woods-mathews.com/  This test is not yet approved or cleared by the [place] FDA and  has been authorized for detection and/or diagnosis of SARS-CoV-2 by FDA under an Emergency Use Authorization (EUA). This EUA will remain  in effect (meaning this test can be used) for the duration of the COVID-19 declaration under Se ction 564(b)(1) of the Act, 21 U.S.C. section 360bbb-3(b)(1), unless the authorization is terminated or revoked sooner.  Performed at [facility], [place] [address] [place], [place] [address]   Culture, blood (Routine x 2)     Status: None (Preliminary result)   Collection Time: 07/12/20  9:40 AM   Specimen: BLOOD  Result Value Ref Range Status   Specimen Description BLOOD LEFT ANTECUBITAL  Final   Special Requests   Final    BOTTLES DRAWN AEROBIC AND ANAEROBIC Blood Culture results may not be optimal due to an inadequate volume of blood received in culture bottles   Culture   Final    NO GROWTH 2 DAYS Performed at [facility], [address], [place], [place] [address]    Report Status PENDING  Incomplete  Culture, blood (Routine x 2)     Status: None (Preliminary result)   Collection Time: 07/12/20  9:40 AM   Specimen: BLOOD  Result Value Ref Range Status   Specimen Description BLOOD PART  Final   Special Requests   Final    BOTTLES DRAWN AEROBIC AND ANAEROBIC Blood Culture adequate volume   Culture   Final    NO GROWTH 2 DAYS Performed at [facility], [address] [place], [place] [address]    Report Status PENDING  Incomplete  Resp Panel by RT-PCR (Flu  A&B, Covid) Nasopharyngeal Swab     Status: None   Collection Time: 07/12/20 10:02 AM   Specimen: Nasopharyngeal Swab; Nasopharyngeal(NP) swabs in  vial transport medium  Result Value Ref Range Status   SARS Coronavirus 2 by RT PCR NEGATIVE NEGATIVE Final    Comment: (NOTE) SARS-CoV-2 target nucleic acids are NOT DETECTED.  The SARS-CoV-2 RNA is generally detectable in upper respiratory specimens during the acute phase of infection. The lowest concentration of SARS-CoV-2 viral copies this assay can detect is 138 copies/mL. A negative result does not preclude SARS-Cov-2 infection and should not be used as the sole basis for treatment or other patient management decisions. A negative result may occur with  improper specimen collection/handling, submission of specimen other than nasopharyngeal swab, presence of viral mutation(s) within the areas targeted by this assay, and inadequate number of viral copies(<138 copies/mL). A negative result must be combined with clinical observations, patient history, and epidemiological information. The expected result is Negative.  Fact Sheet for Patients:  EntrepreneurPulse.com.au  Fact Sheet for Healthcare Providers:  IncredibleEmployment.be  This test is no t yet approved or cleared by the [place] FDA and  has been authorized for detection and/or diagnosis of SARS-CoV-2 by FDA under an Emergency Use Authorization (EUA). This EUA will remain  in effect (meaning this test can be used) for the duration of the COVID-19 declaration under Section 564(b)(1) of the Act, 21 U.S.C.section 360bbb-3(b)(1), unless the authorization is terminated  or revoked sooner.       Influenza A by PCR NEGATIVE NEGATIVE Final   Influenza B by PCR NEGATIVE NEGATIVE Final    Comment: (NOTE) The Xpert Xpress SARS-CoV-2/FLU/RSV plus assay is intended as an aid in the diagnosis of influenza from Nasopharyngeal swab specimens and should not be used as a sole basis for treatment. Nasal washings and aspirates are unacceptable for Xpert Xpress SARS-CoV-2/FLU/RSV testing.  Fact  Sheet for Patients: EntrepreneurPulse.com.au  Fact Sheet for Healthcare Providers: IncredibleEmployment.be  This test is not yet approved or cleared by the [place] FDA and has been authorized for detection and/or diagnosis of SARS-CoV-2 by FDA under an Emergency Use Authorization (EUA). This EUA will remain in effect (meaning this test can be used) for the duration of the COVID-19 declaration under Section 564(b)(1) of the Act, 21 U.S.C. section 360bbb-3(b)(1), unless the authorization is terminated or revoked.  Performed at [facility], [address] [place], [place] [address]   Gastrointestinal Panel by PCR , Stool     Status: Abnormal   Collection Time: 07/13/20  2:12 AM   Specimen: Stool  Result Value Ref Range Status   Campylobacter species NOT DETECTED NOT DETECTED Final   Plesimonas shigelloides NOT DETECTED NOT DETECTED Final   Salmonella species DETECTED (A) NOT DETECTED Final    Comment: RESULT CALLED TO, READ BACK BY AND VERIFIED WITH: ALEXIS STOGDEN ON 07/13/20 AT 0353 QSD    Yersinia enterocolitica NOT DETECTED NOT DETECTED Final   Vibrio species NOT DETECTED NOT DETECTED Final   Vibrio cholerae NOT DETECTED NOT DETECTED Final   Enteroaggregative E coli (EAEC) NOT DETECTED NOT DETECTED Final   Enteropathogenic E coli (EPEC) NOT DETECTED NOT DETECTED Final   Enterotoxigenic E coli (ETEC) NOT DETECTED NOT DETECTED Final   Shiga like toxin producing E coli (STEC) NOT DETECTED NOT DETECTED Final   Shigella/Enteroinvasive E coli (EIEC) NOT DETECTED NOT DETECTED Final   Cryptosporidium NOT DETECTED NOT DETECTED Final   Cyclospora cayetanensis NOT DETECTED NOT DETECTED Final  Entamoeba histolytica NOT DETECTED NOT DETECTED Final   Giardia lamblia NOT DETECTED NOT DETECTED Final   Adenovirus F40/41 NOT DETECTED NOT DETECTED Final   Astrovirus NOT DETECTED NOT DETECTED Final   Norovirus GI/GII NOT DETECTED NOT DETECTED  Final   Rotavirus A NOT DETECTED NOT DETECTED Final   Sapovirus (I, II, IV, and V) NOT DETECTED NOT DETECTED Final    Comment: Performed at [facility], [address] [place], [place] [address]  C Difficile Quick Screen w PCR reflex     Status: None   Collection Time: 07/13/20  2:12 AM   Specimen: STOOL  Result Value Ref Range Status   C Diff antigen NEGATIVE NEGATIVE Final   C Diff toxin NEGATIVE NEGATIVE Final   C Diff interpretation No C. difficile detected.  Final    Comment: Performed at [facility], [address] [place], [place] [address]    IMAGING: [place] Chest 2 View  Result Date: 07/12/2020 CLINICAL DATA:  60 year old male with weakness. Currently undergoing treatment for lymphoma. EXAM: CHEST - 2 VIEW COMPARISON:  06/24/2020 and prior chest radiographs FINDINGS: The cardiomediastinal silhouette is unchanged. Slightly prominent SUPERIOR mediastinum again noted. A RIGHT IJ Port-A-Cath is noted with tip overlying the mid SVC. There is no evidence of focal airspace disease, pulmonary edema, suspicious pulmonary nodule/mass, pleural effusion, or pneumothorax. No acute bony abnormalities are identified. IMPRESSION: No acute cardiopulmonary disease. Electronically Signed   By: Margarette Canada M.D.   On: 07/12/2020 09:57   NM Cardiac [place] Rest  Result Date: 07/01/2020 CLINICAL DATA:  High-grade B-cell lymphoma, pre cardiotoxic chemotherapy EXAM: NUCLEAR MEDICINE CARDIAC BLOOD POOL IMAGING (MUGA) TECHNIQUE: Cardiac multi-gated acquisition was performed at rest following intravenous injection of Tc-16mlabeled red blood cells. RADIOPHARMACEUTICALS:  21.059 mCi [id number] in-vitro labeled red blood cells IV COMPARISON:  None FINDINGS: Calculated LEFT ventricular ejection fraction is 54.4%, normal. Study was obtained at a cardiac rate of 56 bpm. Patient was rhythmic during imaging. Cine analysis of the LEFT ventricle in 3 projections demonstrates normal LV wall  motion. IMPRESSION: Normal LEFT ventricular ejection fraction of 54.4%. Normal LEFT ventricular wall motion. Electronically Signed   By: MaLavonia Dana.D.   On: 07/01/2020 15:16   CT ABDOMEN PELVIS W CONTRAST  Result Date: 07/12/2020 CLINICAL DATA:  Abdominal pain and fever.  Nausea and diarrhea. EXAM: CT ABDOMEN AND PELVIS WITH CONTRAST TECHNIQUE: Multidetector CT imaging of the abdomen and pelvis was performed using the standard protocol following bolus administration of intravenous contrast. CONTRAST:  10037mMNIPAQUE IOHEXOL 300 MG/ML  SOLN COMPARISON:  PET CT dated 05/26/2020. FINDINGS: Lower chest: No acute abnormality. Hepatobiliary: No focal liver abnormality is seen. Gallbladder is unremarkable. No bile duct dilatation. Pancreas: Unremarkable. No pancreatic ductal dilatation or surrounding inflammatory changes. Spleen: Multiple hypodense masses within the spleen, largest measuring 2.8 cm, FDG avid on recent PET-CT indicating lymphoproliferative disorder and presumably related to the recent diagnosis of lymphoma. Adrenals/Urinary Tract: Adrenal glands appear normal. Kidneys are unremarkable without suspicious mass, stone or hydronephrosis. No perinephric fluid. No ureteral or bladder calculi are identified. Bladder is unremarkable, partially decompressed. Stomach/Bowel: Thickening of the walls of the majority of the colon. Additional thickening and enhancement of the walls of multiple segments of small bowel, most prominent within the jejunum and ileum. Fluid is seen throughout the small bowel, with associated air-fluid levels. Stomach is unremarkable.  Appendix is normal in caliber. Vascular/Lymphatic: Aortic atherosclerosis. No acute appearing vascular abnormality. The intra-abdominal intrapelvic lymphadenopathy demonstrated  on recent PET-CT has significantly improved in the interval. Representative lymphadenopathy at the RIGHT pelvic sidewall previously measured 3.5 cm greatest thickness, now measuring  1.6 cm greatest thickness. Lymph node within the LEFT periaortic retroperitoneum previously measured 1.3 cm short axis dimension, now measuring 0.7 cm short axis. Reproductive: Prostate is unremarkable. Other: No free fluid or abscess collection is seen. No free intraperitoneal air. Musculoskeletal: No acute appearing osseous abnormality. IMPRESSION: 1. Significant thickening and enhancement of the walls of the majority of the colon and multiple segments of small bowel, most prominent within the jejunum and ileum. Differential includes diffuse enterocolitis of infectious or inflammatory nature, typhlitis, radiation induced enterocolitis (if corresponding history of radiation) and graft-versus-host disease (if any corresponding history of bone marrow transplant). 2. The intra-abdominal intrapelvic lymphadenopathy demonstrated on recent PET-CT has significantly improved in the interval indicating a good response to interval treatment. Measurements provided above. 3. Multiple hypodense masses within the spleen, largest measuring 2.8 cm, FDG avid on recent PET-CT indicating lymphoproliferative disorder and also presumably related to the recent diagnosis of lymphoma. Aortic Atherosclerosis (ICD10-I70.0). Electronically Signed   By: Franki Cabot M.D.   On: 07/12/2020 12:32   DG CHEST PORT 1 VIEW  Result Date: 06/24/2020 CLINICAL DATA:  Status post port placement EXAM: PORTABLE CHEST 1 VIEW COMPARISON:  10/21/2019 FINDINGS: Right-sided chest wall port is noted. Catheter tip is seen in the mid superior vena cava in satisfactory position. No pneumothorax is noted. Persistent fullness in the right paratracheal space is noted consistent with the patient's known history. Cardiac shadow is within normal limits. Mild bibasilar atelectasis is noted related to a poor inspiratory effort. No bony abnormality is seen. IMPRESSION: No pneumothorax following port placement. Electronically Signed   By: Inez Catalina M.D.   On:  06/24/2020 11:28   CT BONE MARROW BIOPSY & ASPIRATION  Result Date: 06/25/2020 INDICATION: New diagnosis of lymphoma. Please perform CT-guided bone marrow biopsy for tissue diagnostic purposes. EXAM: CT-GUIDED BONE MARROW BIOPSY AND ASPIRATION MEDICATIONS: None ANESTHESIA/SEDATION: Fentanyl 50 mcg IV; Versed 1 mg IV Sedation Time: 10 Minutes; The patient was continuously monitored during the procedure by the interventional radiology nurse under my direct supervision. COMPLICATIONS: None immediate. PROCEDURE: Informed consent was obtained from the patient following an explanation of the procedure, risks, benefits and alternatives. The patient understands, agrees and consents for the procedure. All questions were addressed. A time out was performed prior to the initiation of the procedure. The patient was positioned prone and non-contrast localization CT was performed of the pelvis to demonstrate the iliac marrow spaces. The operative site was prepped and draped in the usual sterile fashion. Under sterile conditions and local anesthesia, a 22 gauge spinal needle was utilized for procedural planning. Next, an 11 gauge coaxial bone biopsy needle was advanced into the left iliac marrow space. Needle position was confirmed with CT imaging. Initially, a bone marrow aspiration was performed. Next, a bone marrow biopsy was obtained with the 11 gauge outer bone marrow device. Samples were prepared with the cytotechnologist and deemed adequate. The needle was removed and superficial hemostasis was obtained with manual compression. A dressing was applied. The patient tolerated the procedure well without immediate post procedural complication. IMPRESSION: Successful CT guided left iliac bone marrow aspiration and core biopsy. Electronically Signed   By: Sandi Mariscal M.D.   On: 06/25/2020 10:30   DG C-Arm 1-60 Min-No Report  Result Date: 06/24/2020 Fluoroscopy was utilized by the requesting physician.  No radiographic  interpretation.  DG FLUORO GUIDED LOC OF NEEDLE/CATH TIP FOR SPINAL INJECT LT  Result Date: 07/08/2020 CLINICAL DATA:  Non-Hodgkin's lymphoma EXAM: DIAGNOSTIC LUMBAR PUNCTURE UNDER FLUOROSCOPIC GUIDANCE COMPARISON:  None FLUOROSCOPY TIME:  Fluoroscopy Time:  30 seconds Radiation Exposure Index (if provided by the fluoroscopic device): 4.9 mGy Number of Acquired Spot Images: 0 PROCEDURE: Informed consent was obtained from the patient prior to the procedure, including potential complications of headache, allergy, and pain. With the patient prone, the lower back was prepped with Betadine. 1% Lidocaine was used for local anesthesia. Lumbar puncture was performed at the L4-5 level using a 22 gauge needle with return of clear 5 ml of CSF were obtained for laboratory studies. Subsequently, 12 mg of methotrexate in 5 mL was hand injected into the thecal sac. The patient tolerated the procedure well and there were no apparent complications. IMPRESSION: Successful fluoroscopic guided lumbar puncture for methotrexate injection. Electronically Signed   By: Kathreen Devoid   On: 07/08/2020 13:54    Assessment:   Christopher Mills is a 60 y.o. 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  He was also having episodes of diarrhea.  He had a stool PCR done which was positive for Salmonella.  Blood cultures have been no growth to date.  CT of the abdomen did show thickening and enhancement of the colon and multiple segments of the small bowel.  There was improvement of intra-abdominal intrapelvic lymphadenopathy compared to recent CT. Of note he does have a port in place. Since admission he is defervesced.  White count remains low and ANC  is 0.3. Recommendations Cont cefepime and flagyl Once neutropenia resolved would change to oral quinolone. Would plan on at least 14 days treatment given  immunocompromised state.   Thank you very much for allowing me to participate in the care of this patient. Please call with questions.   Cheral Marker. Ola Spurr, MD

## 2020-07-14 NOTE — Progress Notes (Signed)
Initial Nutrition Assessment  DOCUMENTATION CODES:  Severe malnutrition in context of acute illness/injury  INTERVENTION:   Continue current diet as ordered  Ensure Enlive po TID, each supplement provides 350 kcal and 20 grams of protein  Snacks BID  NUTRITION DIAGNOSIS:  Severe Malnutrition (in the context of acute illness/injury) related to cancer and cancer related treatments as evidenced by percent weight loss,moderate fat depletion,moderate muscle depletion (5.4% x 1 month).  GOAL:  Patient will meet greater than or equal to 90% of their needs  MONITOR:  PO intake,Supplement acceptance  REASON FOR ASSESSMENT:  Consult Assessment of nutrition requirement/status  ASSESSMENT:  Pt presented to ED with weakness, fevers, and several days of diarrhea at home accompanied by abdominal pain and nausea. 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 Found to have neutropenic fever in ED. PMH relevant for HTN and lymphoma.  Pt resting in bed at the time of visit, visitor at bedside. Pt reports that he ate a little of his breakfast, but he is eating much less than he would be at home. Reports he just isn't feeling well yet. Discussed increased energy needs with chemo. Discussed ways to increase nutrient density of food at home. Recommended a bedtime snack. Pt endorses weight loss from a UBW of ~190 lbs. 5.4% weight loss x 1 month (4/5-5/2), severe for this time frame. Pt does reports that he bought some ensure PTA, agreeable to receiving while admitted. Prefers chocolate. Will also send snacks between meals to encourage intake.   Relevant Continuous Infusions: . ceFEPime (MAXIPIME) IV 2 g (07/14/20 0929)  . lactated ringers 50 mL/hr at 07/14/20 0335  . metronidazole 500 mg (07/14/20 0510)   Relevant PRN Meds: ondansetron  Labs reviewed:  Na 131  K 3.3  NUTRITION - FOCUSED PHYSICAL EXAM: Flowsheet Row Most Recent Value  Orbital Region Mild depletion  Upper Arm  Region Moderate depletion  Thoracic and Lumbar Region Moderate depletion  Buccal Region Mild depletion  Temple Region Mild depletion  Clavicle Bone Region Moderate depletion  Clavicle and Acromion Bone Region Mild depletion  Scapular Bone Region Mild depletion  Dorsal Hand No depletion  Patellar Region Mild depletion  Anterior Thigh Region Mild depletion  Posterior Calf Region Moderate depletion  Edema (RD Assessment) None  Hair Reviewed  Eyes Reviewed  Mouth Reviewed  Skin Reviewed  Nails Reviewed     Diet Order:   Diet Order            Diet regular Room service appropriate? Yes; Fluid consistency: Thin  Diet effective now                EDUCATION NEEDS:  No education needs have been identified at this time  Skin:  Skin Assessment: Reviewed RN Assessment  Last BM:  5/1 per RN documentation  Height:  Ht Readings from Last 1 Encounters:  07/12/20 6\' 1"  (1.854 m)    Weight:  Wt Readings from Last 6 Encounters:  07/14/20 80.2 kg  07/10/20 79.8 kg  07/03/20 87 kg  07/01/20 87.7 kg  06/25/20 83.9 kg  06/17/20 84.8 kg   Ideal Body Weight:  83.6 kg  BMI:  Body mass index is 23.33 kg/m.  Estimated Nutritional Needs:   Kcal:  2400-2600 kcal  Protein:  120-130 g  Fluid:  >2.4 L/d   Ranell Patrick, RD, LDN Clinical Dietitian Pager on [facility]

## 2020-07-15 ENCOUNTER — Ambulatory Visit: Payer: Self-pay

## 2020-07-15 ENCOUNTER — Inpatient Hospital Stay: Payer: Self-pay | Attending: Oncology

## 2020-07-15 ENCOUNTER — Inpatient Hospital Stay: Payer: Self-pay

## 2020-07-15 DIAGNOSIS — Z8269 Family history of other diseases of the musculoskeletal system and connective tissue: Secondary | ICD-10-CM | POA: Insufficient documentation

## 2020-07-15 DIAGNOSIS — Z8349 Family history of other endocrine, nutritional and metabolic diseases: Secondary | ICD-10-CM | POA: Insufficient documentation

## 2020-07-15 DIAGNOSIS — D709 Neutropenia, unspecified: Secondary | ICD-10-CM | POA: Insufficient documentation

## 2020-07-15 DIAGNOSIS — Z832 Family history of diseases of the blood and blood-forming organs and certain disorders involving the immune mechanism: Secondary | ICD-10-CM | POA: Insufficient documentation

## 2020-07-15 DIAGNOSIS — I700 Atherosclerosis of aorta: Secondary | ICD-10-CM | POA: Insufficient documentation

## 2020-07-15 DIAGNOSIS — R590 Localized enlarged lymph nodes: Secondary | ICD-10-CM | POA: Insufficient documentation

## 2020-07-15 DIAGNOSIS — Z8249 Family history of ischemic heart disease and other diseases of the circulatory system: Secondary | ICD-10-CM | POA: Insufficient documentation

## 2020-07-15 DIAGNOSIS — Z87891 Personal history of nicotine dependence: Secondary | ICD-10-CM | POA: Insufficient documentation

## 2020-07-15 DIAGNOSIS — Z5112 Encounter for antineoplastic immunotherapy: Secondary | ICD-10-CM | POA: Insufficient documentation

## 2020-07-15 DIAGNOSIS — C8518 Unspecified B-cell lymphoma, lymph nodes of multiple sites: Secondary | ICD-10-CM | POA: Insufficient documentation

## 2020-07-15 DIAGNOSIS — Z79899 Other long term (current) drug therapy: Secondary | ICD-10-CM | POA: Insufficient documentation

## 2020-07-15 DIAGNOSIS — Z515 Encounter for palliative care: Secondary | ICD-10-CM

## 2020-07-15 DIAGNOSIS — D6481 Anemia due to antineoplastic chemotherapy: Secondary | ICD-10-CM | POA: Insufficient documentation

## 2020-07-15 DIAGNOSIS — Z809 Family history of malignant neoplasm, unspecified: Secondary | ICD-10-CM | POA: Insufficient documentation

## 2020-07-15 DIAGNOSIS — K529 Noninfective gastroenteritis and colitis, unspecified: Secondary | ICD-10-CM | POA: Insufficient documentation

## 2020-07-15 DIAGNOSIS — R0602 Shortness of breath: Secondary | ICD-10-CM | POA: Insufficient documentation

## 2020-07-15 DIAGNOSIS — F1290 Cannabis use, unspecified, uncomplicated: Secondary | ICD-10-CM | POA: Insufficient documentation

## 2020-07-15 DIAGNOSIS — R531 Weakness: Secondary | ICD-10-CM | POA: Insufficient documentation

## 2020-07-15 DIAGNOSIS — Z5111 Encounter for antineoplastic chemotherapy: Secondary | ICD-10-CM | POA: Insufficient documentation

## 2020-07-15 DIAGNOSIS — T451X5A Adverse effect of antineoplastic and immunosuppressive drugs, initial encounter: Secondary | ICD-10-CM | POA: Insufficient documentation

## 2020-07-15 LAB — CBC WITH DIFFERENTIAL/PLATELET
Abs Immature Granulocytes: 0.01 K/uL (ref 0.00–0.07)
Basophils Absolute: 0 K/uL (ref 0.0–0.1)
Basophils Relative: 2 %
Eosinophils Absolute: 0 K/uL (ref 0.0–0.5)
Eosinophils Relative: 1 %
HCT: 28.5 % — ABNORMAL LOW (ref 39.0–52.0)
Hemoglobin: 10 g/dL — ABNORMAL LOW (ref 13.0–17.0)
Immature Granulocytes: 1 %
Lymphocytes Relative: 42 %
Lymphs Abs: 0.6 K/uL — ABNORMAL LOW (ref 0.7–4.0)
MCH: 29.5 pg (ref 26.0–34.0)
MCHC: 35.1 g/dL (ref 30.0–36.0)
MCV: 84.1 fL (ref 80.0–100.0)
Monocytes Absolute: 0.2 K/uL (ref 0.1–1.0)
Monocytes Relative: 13 %
Neutro Abs: 0.5 K/uL — ABNORMAL LOW (ref 1.7–7.7)
Neutrophils Relative %: 41 %
Platelets: 76 K/uL — ABNORMAL LOW (ref 150–400)
RBC: 3.39 MIL/uL — ABNORMAL LOW (ref 4.22–5.81)
RDW: 13 % (ref 11.5–15.5)
Smear Review: NORMAL
WBC Morphology: ABNORMAL
WBC: 1.3 K/uL — CL (ref 4.0–10.5)
nRBC: 0 % (ref 0.0–0.2)

## 2020-07-15 LAB — BASIC METABOLIC PANEL WITH GFR
Anion gap: 8 (ref 5–15)
BUN: 10 mg/dL (ref 6–20)
CO2: 23 mmol/L (ref 22–32)
Calcium: 8.7 mg/dL — ABNORMAL LOW (ref 8.9–10.3)
Chloride: 103 mmol/L (ref 98–111)
Creatinine, Ser: 0.76 mg/dL (ref 0.61–1.24)
GFR, Estimated: >60 mL/min
Glucose, Bld: 97 mg/dL (ref 70–99)
Potassium: 3.4 mmol/L — ABNORMAL LOW (ref 3.5–5.1)
Sodium: 134 mmol/L — ABNORMAL LOW (ref 135–145)

## 2020-07-15 LAB — CALCIUM, IONIZED: Calcium, Ionized, Serum: 5.1 mg/dL (ref 4.5–5.6)

## 2020-07-15 LAB — MAGNESIUM: Magnesium: 1.9 mg/dL (ref 1.7–2.4)

## 2020-07-15 IMAGING — MR MR HEAD WO/W CM
17 series · 46 of 48 positions shown · IV contrast (8ml Gadavist)
Comparison: None.

CLINICAL DATA: Lymphoma, staging

EXAM:
MRI HEAD WITHOUT AND WITH CONTRAST
TECHNIQUE: Multiplanar, multiecho pulse sequences of the brain and surrounding
structures were obtained without and with intravenous contrast.
CONTRAST:  8mL GADAVIST GADOBUTROL 1 MMOL/ML IV SOLN

[Series 5: ax dwi_tracew · axial · 3.0mm · 0.65mm/px · z∈[-74,+79]mm · 3 of 48 slices shown]
[im 1/48]
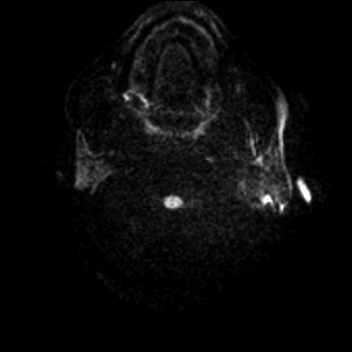
[im 24/48]
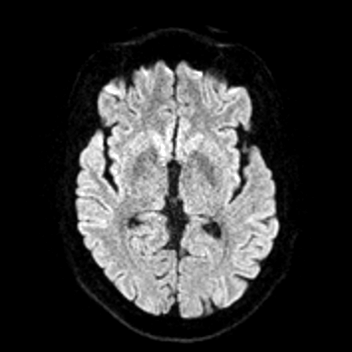
[im 48/48]
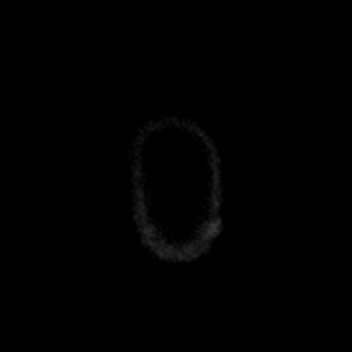

[Series 6: ax dwi_adc · axial · 3.0mm · 0.65mm/px · z∈[-74,+79]mm · 3 of 48 slices shown]
[im 1/48]
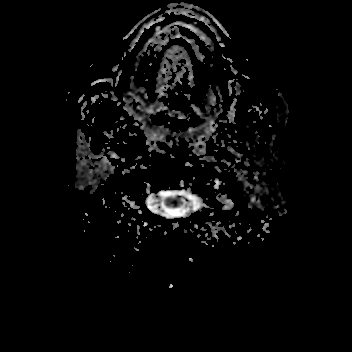
[im 24/48]
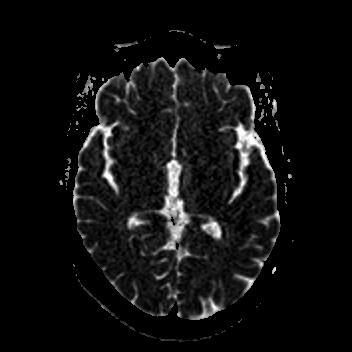
[im 48/48]
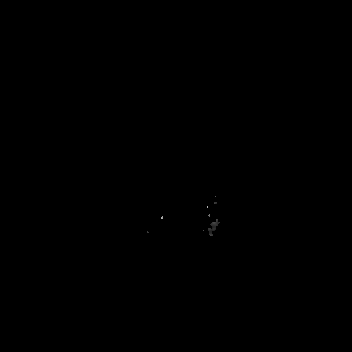

[Series 7: cor dwi_tracew · coronal · 5.0mm · 0.65mm/px · 1 of 38 slices shown]
[im 1/38]
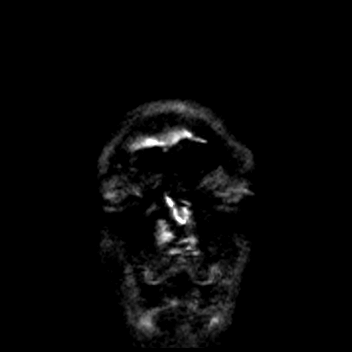

[Series 8: cor dwi_adc · coronal · 5.0mm · 0.65mm/px · 1 of 37 slices shown]
[im 1/37]
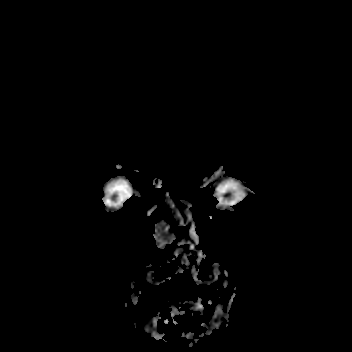

[Series 9: T1 · sagittal · 5.0mm · 0.62mm/px · 1 of 23 slices shown]
[im 1/23]
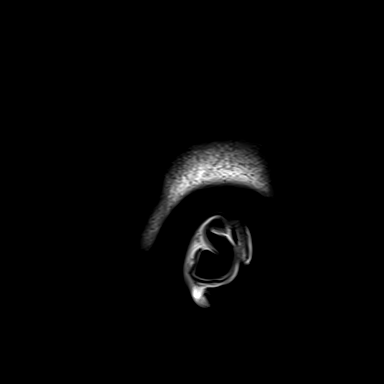

[Series 10: T2 · axial · 5.0mm · 0.53mm/px · 1 of 25 slices shown (1 of 2)]
[im 1/25]
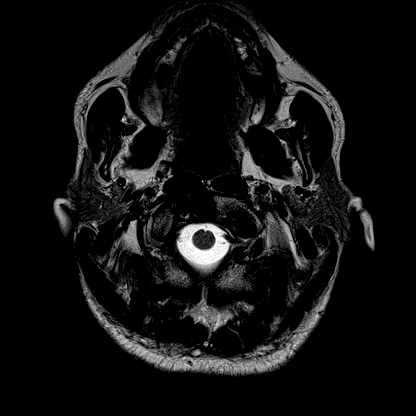

[Series 11: mag_images · axial · 3.0mm · 0.90mm/px · z∈[-86,+88]mm · 2 of 60 slices shown]
[im 1/60]
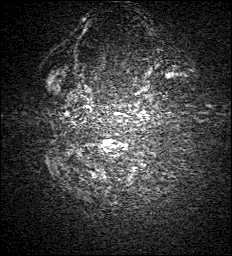
[im 60/60]
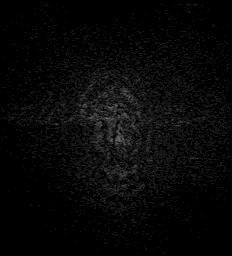

[Series 12: pha_images · axial · 3.0mm · 0.90mm/px · z∈[-86,+88]mm · 2 of 60 slices shown]
[im 1/60]
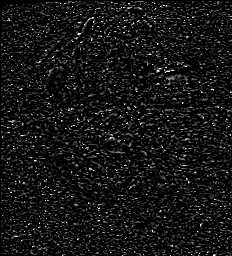
[im 60/60]
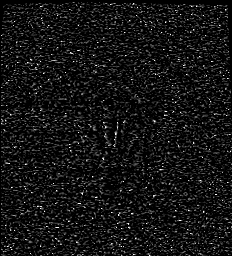

[Series 13: swi_images · axial · 3.0mm · 0.90mm/px · z∈[-86,+88]mm · 2 of 60 slices shown]
[im 1/60]
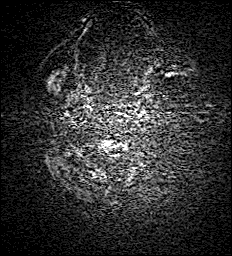
[im 60/60]
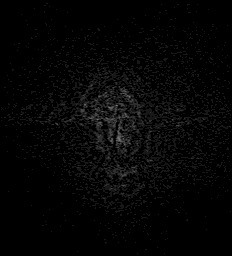

[Series 15: FLAIR · axial · 3.0mm · 0.53mm/px · z∈[-79,+80]mm · 2 of 55 slices shown]
[im 1/55]
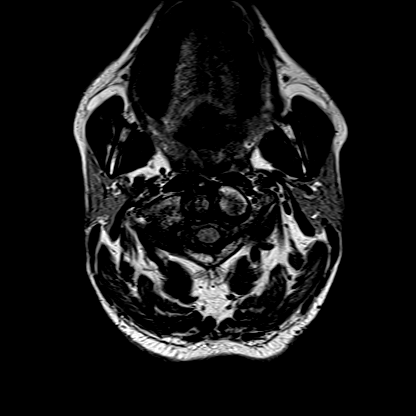
[im 55/55]
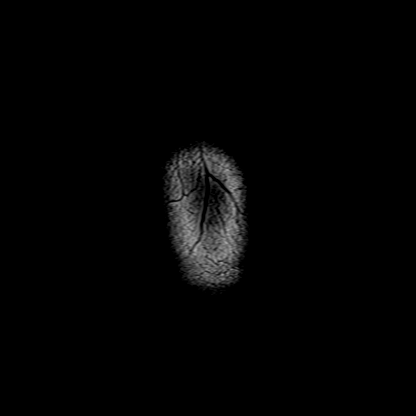

[Series 16: T2 · sagittal · non-contrast · 1.0mm · 0.98mm/px · 7 of 176 slices shown (2 of 2)]
[im 1/176]
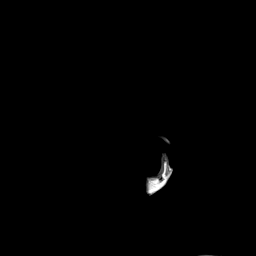
[im 30/176]
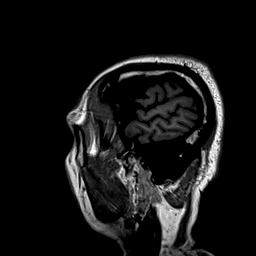
[im 59/176]
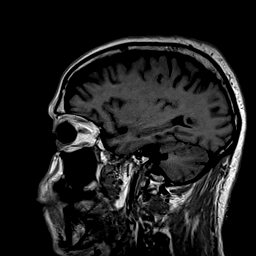
[im 88/176]
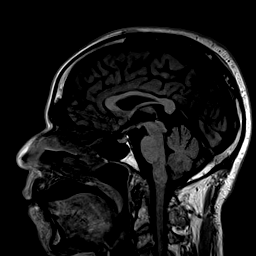
[im 117/176]
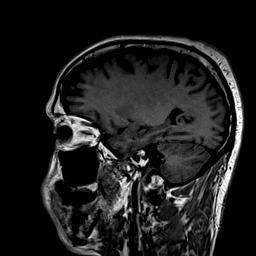
[im 146/176]
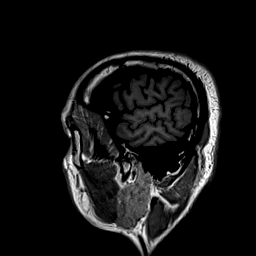
[im 176/176]
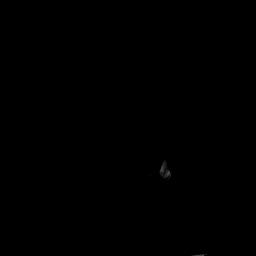

[Series 17: T2 post-contrast · coronal · 5.0mm · 0.57mm/px · 1 of 29 slices shown (1 of 2)]
[im 1/29]
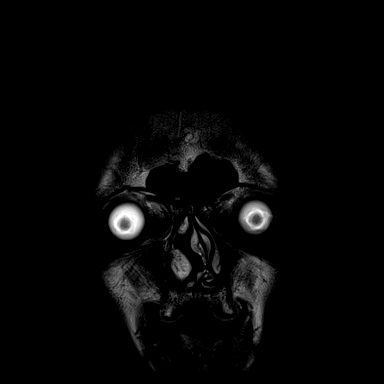

[Series 18: T2 post-contrast · sagittal · 1.0mm · 0.98mm/px · 7 of 176 slices shown (2 of 2)]
[im 1/176]
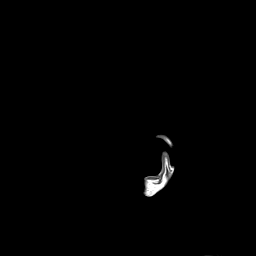
[im 30/176]
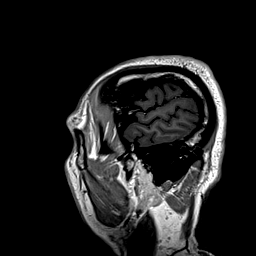
[im 59/176]
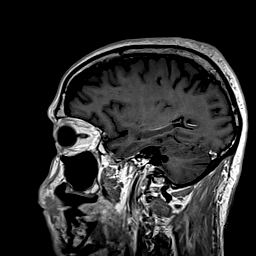
[im 88/176]
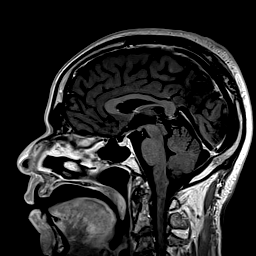
[im 117/176]
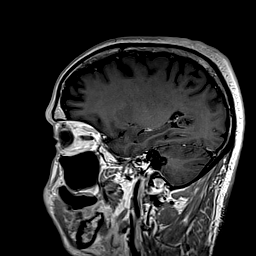
[im 146/176]
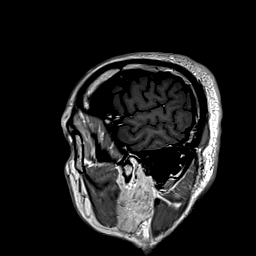
[im 176/176]
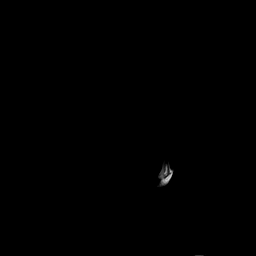

[Series 19: T1 post-contrast · axial · 1.0mm · 0.98mm/px · z∈[-77,+79]mm · 6 of 160 slices shown (1 of 3)]
[im 1/160]
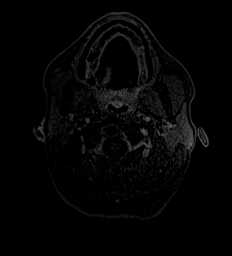
[im 32/160]
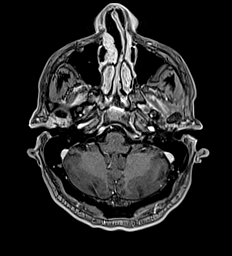
[im 64/160]
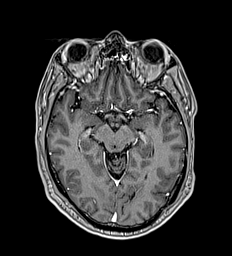
[im 96/160]
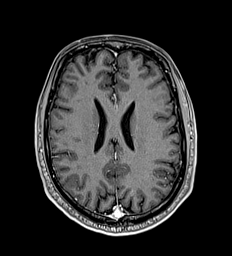
[im 128/160]
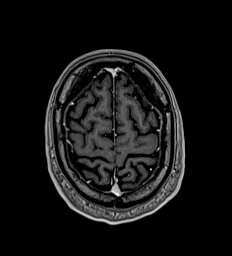
[im 160/160]
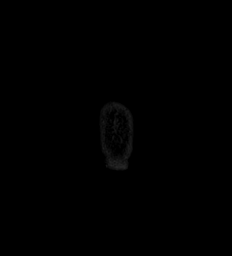

[Series 20: T1 post-contrast · coronal · 5.0mm · 0.57mm/px · 1 of 29 slices shown (2 of 3)]
[im 1/29]
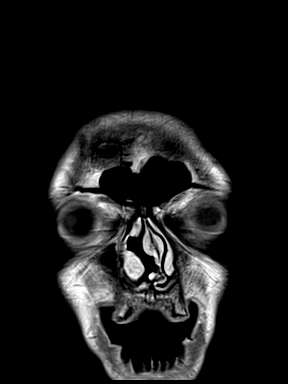

[Series 21: T1 post-contrast · sagittal · 5.0mm · 0.62mm/px · 1 of 23 slices shown (3 of 3)]
[im 1/23]
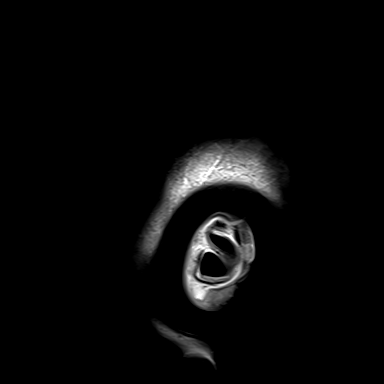

[Series 1021: mpr 1mm range · axial · 1.0mm · 0.49mm/px · z∈[-90,+26]mm · 5 of 181 slices shown]
[im 1/181]
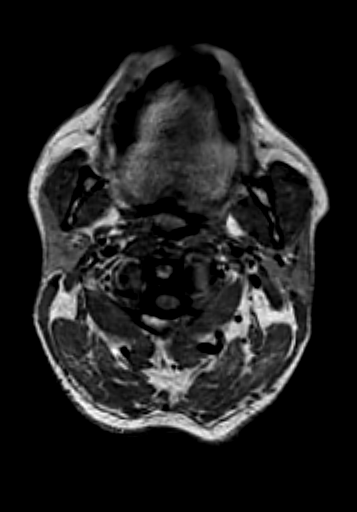
[im 31/181]
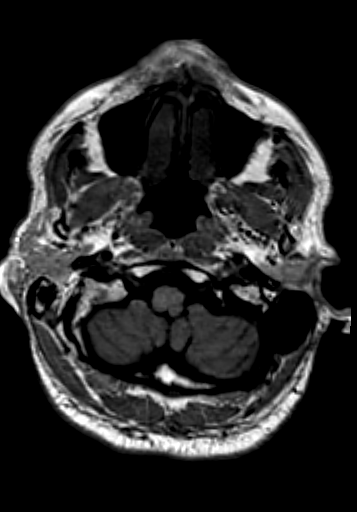
[im 61/181]
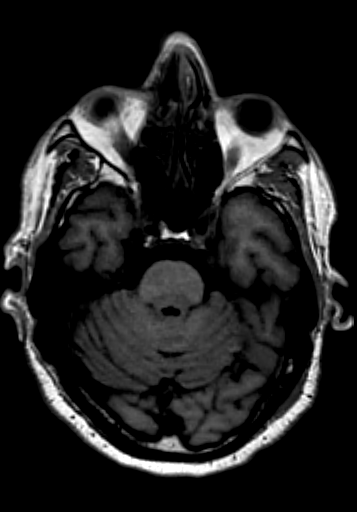
[im 91/181]
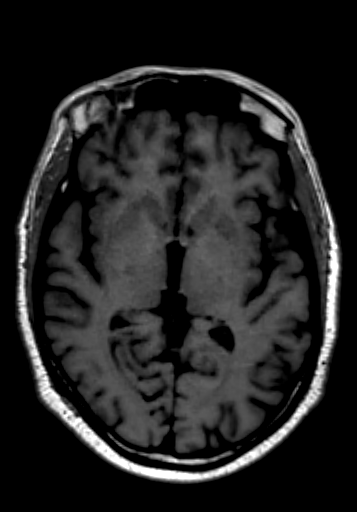
[im 121/181]
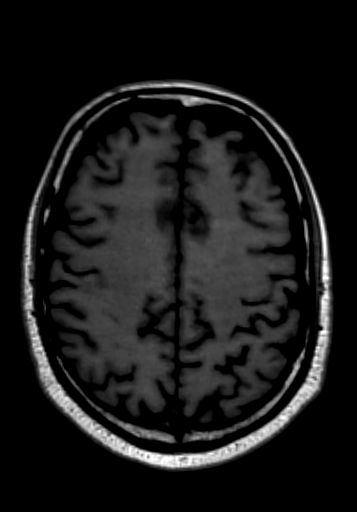

[46 of 48 positions shown; findings below may reference images not displayed]

FINDINGS: Brain: There is no acute infarction or intracranial hemorrhage.
There is no intracranial mass, mass effect, or edema. There is no
hydrocephalus or extra-axial fluid collection. Ventricles and sulci
are normal in size and configuration. Small focus of right
subinsular T2 hyperintensity likely reflects nonspecific
gliosis/demyelination. No abnormal enhancement.

Vascular: Major vessel flow voids at the skull base are preserved.

Skull and upper cervical spine: Normal marrow signal is preserved.

Sinuses/Orbits: Minor mucosal thickening.  Orbits are unremarkable.

Other: Sella is unremarkable.  Mastoid air cells are clear.
IMPRESSION: No evidence of metastatic disease.

## 2020-07-15 MED ORDER — GADOBUTROL 1 MMOL/ML IV SOLN
8.0000 mL | Freq: Once | INTRAVENOUS | Status: AC | PRN
  Administered 2020-07-15: 8 mL via INTRAVENOUS

## 2020-07-15 NOTE — Progress Notes (Signed)
PT Cancellation Note  Patient Details Name: Christopher Mills MRN: [id number] DOB: [birth year]   Cancelled Treatment:    Reason Eval/Treat Not Completed: Patient at procedure or test/unavailable.  Pt currently off floor for imaging.  Will re-attempt PT session at a later date/time as able.  Leitha Bleak, PT 07/15/20, 9:02 AM

## 2020-07-15 NOTE — Progress Notes (Signed)
PROGRESS NOTE    Christopher Mills  KGY:[id number] DOB: 11/02/1960 DOA: 07/12/2020 PCP: [name], MD  106A/106A-AA   Assessment & Plan:   Principal Problem:   Neutropenic fever ([place]) Active Problems:   Diarrhea   Enterocolitis   Acute hyponatremia   Abdominal pain   Acute kidney injury ([place])   Thrombocytopenia ([place])   Generalized weakness   Dehydration   Hypertension   Sepsis ([place])   Salmonella gastroenteritis   Protein-calorie malnutrition, severe   Palliative care encounter   Christopher Mills is a 60 y.o. male with medical history significant for high-grade B-cell lymphoma undergoing chemotherapy, hypertension, who is admitted to [facility] on 07/12/2020 with neutropenic fever after presenting from home to [facility] ED complaining of generalized weakness and diarrhea.   #) Neutropenic fever, POA Salmonella gastroenteritis  --temperature of 100.7, with absolute neutrophil count 400.  This is in the context of an underlying history of high-grade B cell lymphoma for which the patient has been undergoing chemotherapy, with most recent infusion occurring 3 weeks ago per the patient.   --GI path pos for Salmonella --started on cefepime and flagyl on presentation --ID consulted, per oncology rec Plan: --cont cefepime and flagyl, per ID --Once neutropenia resolved would change to oral quinolone. --Would plan on at least 14 days treatment given immunocompromised state.    Severe Sepsis, POA --fever, tachycardia, neutropenia with GI infection, with AKI.  #) Acute hypovolemic hyponatremia, improved --Presenting serum sodium found to be 128, improved with IVF. --hold further IVF and encourage oral hydration.  #) Acute kidney injury, POA, resolved --Presenting creatinine found to be 1.35, baseline ~1.  Likely due to dehydration. --s/p IVF Plan: --hold further IVF and encourage oral hydration. --Hold home lisinopril.    #) Thrombocytopenia:  Presenting  CBC notable for platelet count of 85. Suspect that this is a consequence of the patient's chemotherapy.   --Monitor for now  #) Generalized weakness --PT  #) Essential hypertension  Outpatient antihypertensive regimen consists of lisinopril and atenolol.   --Systolic blood pressures in the emergency department have been in the low 80's up to 120 mmHg --Hold home atenolol and Lisinopril for now due to intermittent low BP.  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   --CT indicates positive treatment response.  --MRI brain today showed no mets.  --continue outpatient followup with oncology Dr. Janese Banks.  Severe malnutrition in the context of acute illness --nutrition consulted --need an alternative supplement as pt appeared to be lactose intolerant   DVT prophylaxis: SCD/Compression stockings Code Status: Full code  Family Communication: daughter updated on the phone today Level of care: Med-Surg Dispo:   The patient is from: home Anticipated d/c is to: home Anticipated d/c date is: 1-2 days Patient currently is not medically ready to d/c due to: neutropenic with active infection, need to be on broad spectrum until neutropenia resolves.   Subjective and Interval History:  Pt reported doing well.  No more fever.  Abdominal bloating from Ensure resolved after BM.   Objective: Vitals:   07/15/20 0500 07/15/20 0803 07/15/20 1122 07/15/20 1458  BP:  134/81 117/74 (!) 105/51  Pulse:  76 74 72  Resp:  18 18 16   Temp:  (!) 97.3 F (36.3 C) 97.6 F (36.4 C) 98.3 F (36.8 C)  TempSrc:  Oral Oral Oral  SpO2:  100% 100% 100%  Weight: 82 kg     Height:        Intake/Output  Summary (Last 24 hours) at 07/15/2020 1812 Last data filed at 07/15/2020 0750 Gross per 24 hour  Intake --  Output 400 ml  Net -400 ml   Filed Weights   07/12/20 0903 07/14/20 0500 07/15/20 0500  Weight: 79.4 kg 80.2 kg 82 kg    Examination:   Constitutional: NAD, AAOx3 HEENT:  conjunctivae and lids normal, EOMI CV: No cyanosis.   RESP: normal respiratory effort, on RA Extremities: No effusions, edema in BLE SKIN: warm, dry Neuro: II - XII grossly intact.   Psych: Normal mood and affect.  Appropriate judgement and reason   Data Reviewed: I have personally reviewed following labs and imaging studies  CBC: Recent Labs  Lab 07/10/20 0814 07/12/20 0911 07/13/20 0835 07/14/20 0458 07/15/20 0523  WBC 1.1* 0.8* 0.7* 0.7* 1.3*  NEUTROABS 0.2* 0.4* 0.3* 0.3* 0.5*  HGB 13.4 12.5* 10.6* 9.7* 10.0*  HCT 39.3 35.9* 30.2* 27.4* 28.5*  MCV 85.4 84.3 84.6 83.5 84.1  PLT 79* 85* 59* 51* 76*   Basic Metabolic Panel: Recent Labs  Lab 07/10/20 0814 07/12/20 0911 07/13/20 0835 07/14/20 0458 07/15/20 0523  NA 134* 128* 133* 131* 134*  K 3.7 4.1 3.7 3.3* 3.4*  CL 99 96* 101 100 103  CO2 26 21* 22 23 23   GLUCOSE 105* 120* 91 101* 97  BUN 24* 26* 17 13 10   CREATININE 1.07 1.35* 0.97 0.82 0.76  CALCIUM 9.4 9.4 8.8* 8.6* 8.7*  MG  --  2.1 2.1 2.0 1.9  PHOS  --   --  2.3*  --   --    GFR: Estimated Creatinine Clearance: 111 mL/min (by C-G formula based on SCr of 0.76 mg/dL). Liver Function Tests: Recent Labs  Lab 07/10/20 0814 07/12/20 0911 07/13/20 0835  AST 27 14* 21  ALT 37 25 26  ALKPHOS 88 73 60  BILITOT 0.9 1.3* 1.2  PROT 6.9 7.2 6.3*  ALBUMIN 3.9 3.6 2.9*   Recent Labs  Lab 07/12/20 0911  LIPASE 21   No results for input(s): AMMONIA in the last 168 hours. Coagulation Profile: Recent Labs  Lab 07/12/20 0911  INR 1.1   Cardiac Enzymes: No results for input(s): CKTOTAL, CKMB, CKMBINDEX, TROPONINI in the last 168 hours. BNP (last 3 results) No results for input(s): PROBNP in the last 8760 hours. HbA1C: No results for input(s): HGBA1C in the last 72 hours. CBG: No results for input(s): GLUCAP in the last 168 hours. Lipid Profile: No results for input(s): CHOL, HDL, LDLCALC, TRIG, CHOLHDL, LDLDIRECT in the last 72 hours. Thyroid  Function Tests: No results for input(s): TSH, T4TOTAL, FREET4, T3FREE, THYROIDAB in the last 72 hours. Anemia Panel: No results for input(s): VITAMINB12, FOLATE, FERRITIN, TIBC, IRON, RETICCTPCT in the last 72 hours. Sepsis Labs: Recent Labs  Lab 07/12/20 0940 07/12/20 1113  LATICACIDVEN 1.5 1.2    Recent Results (from the past 240 hour(s))  Culture, blood (Routine x 2)     Status: None (Preliminary result)   Collection Time: 07/12/20  9:40 AM   Specimen: BLOOD  Result Value Ref Range Status   Specimen Description BLOOD LEFT ANTECUBITAL  Final   Special Requests   Final    BOTTLES DRAWN AEROBIC AND ANAEROBIC Blood Culture results may not be optimal due to an inadequate volume of blood received in culture bottles   Culture   Final    NO GROWTH 3 DAYS Performed at [facility], [address] [place], [place] [address]    Report Status PENDING  Incomplete  Culture, blood (Routine x 2)     Status: None (Preliminary result)   Collection Time: 07/12/20  9:40 AM   Specimen: BLOOD  Result Value Ref Range Status   Specimen Description BLOOD PART  Final   Special Requests   Final    BOTTLES DRAWN AEROBIC AND ANAEROBIC Blood Culture adequate volume   Culture   Final    NO GROWTH 3 DAYS Performed at [facility], [address], [place], [place] [address]    Report Status PENDING  Incomplete  Resp Panel by RT-PCR (Flu A&B, Covid) Nasopharyngeal Swab     Status: None   Collection Time: 07/12/20 10:02 AM   Specimen: Nasopharyngeal Swab; Nasopharyngeal(NP) swabs in vial transport medium  Result Value Ref Range Status   SARS Coronavirus 2 by RT PCR NEGATIVE NEGATIVE Final    Comment: (NOTE) SARS-CoV-2 target nucleic acids are NOT DETECTED.  The SARS-CoV-2 RNA is generally detectable in upper respiratory specimens during the acute phase of infection. The lowest concentration of SARS-CoV-2 viral copies this assay can detect is 138 copies/mL. A negative result does  not preclude SARS-Cov-2 infection and should not be used as the sole basis for treatment or other patient management decisions. A negative result may occur with  improper specimen collection/handling, submission of specimen other than nasopharyngeal swab, presence of viral mutation(s) within the areas targeted by this assay, and inadequate number of viral copies(<138 copies/mL). A negative result must be combined with clinical observations, patient history, and epidemiological information. The expected result is Negative.  Fact Sheet for Patients:  EntrepreneurPulse.com.au  Fact Sheet for Healthcare Providers:  IncredibleEmployment.be  This test is no t yet approved or cleared by the [place] FDA and  has been authorized for detection and/or diagnosis of SARS-CoV-2 by FDA under an Emergency Use Authorization (EUA). This EUA will remain  in effect (meaning this test can be used) for the duration of the COVID-19 declaration under Section 564(b)(1) of the Act, 21 U.S.C.section 360bbb-3(b)(1), unless the authorization is terminated  or revoked sooner.       Influenza A by PCR NEGATIVE NEGATIVE Final   Influenza B by PCR NEGATIVE NEGATIVE Final    Comment: (NOTE) The Xpert Xpress SARS-CoV-2/FLU/RSV plus assay is intended as an aid in the diagnosis of influenza from Nasopharyngeal swab specimens and should not be used as a sole basis for treatment. Nasal washings and aspirates are unacceptable for Xpert Xpress SARS-CoV-2/FLU/RSV testing.  Fact Sheet for Patients: EntrepreneurPulse.com.au  Fact Sheet for Healthcare Providers: IncredibleEmployment.be  This test is not yet approved or cleared by the [place] FDA and has been authorized for detection and/or diagnosis of SARS-CoV-2 by FDA under an Emergency Use Authorization (EUA). This EUA will remain in effect (meaning this test can be used) for the  duration of the COVID-19 declaration under Section 564(b)(1) of the Act, 21 U.S.C. section 360bbb-3(b)(1), unless the authorization is terminated or revoked.  Performed at [facility], [address] [place], [place] [address]   Gastrointestinal Panel by PCR , Stool     Status: Abnormal   Collection Time: 07/13/20  2:12 AM   Specimen: Stool  Result Value Ref Range Status   Campylobacter species NOT DETECTED NOT DETECTED Final   Plesimonas shigelloides NOT DETECTED NOT DETECTED Final   Salmonella species DETECTED (A) NOT DETECTED Final    Comment: RESULT CALLED TO, READ BACK BY AND VERIFIED WITH: ALEXIS STOGDEN ON 07/13/20 AT 0353 QSD    Yersinia  enterocolitica NOT DETECTED NOT DETECTED Final   Vibrio species NOT DETECTED NOT DETECTED Final   Vibrio cholerae NOT DETECTED NOT DETECTED Final   Enteroaggregative E coli (EAEC) NOT DETECTED NOT DETECTED Final   Enteropathogenic E coli (EPEC) NOT DETECTED NOT DETECTED Final   Enterotoxigenic E coli (ETEC) NOT DETECTED NOT DETECTED Final   Shiga like toxin producing E coli (STEC) NOT DETECTED NOT DETECTED Final   Shigella/Enteroinvasive E coli (EIEC) NOT DETECTED NOT DETECTED Final   Cryptosporidium NOT DETECTED NOT DETECTED Final   Cyclospora cayetanensis NOT DETECTED NOT DETECTED Final   Entamoeba histolytica NOT DETECTED NOT DETECTED Final   Giardia lamblia NOT DETECTED NOT DETECTED Final   Adenovirus F40/41 NOT DETECTED NOT DETECTED Final   Astrovirus NOT DETECTED NOT DETECTED Final   Norovirus GI/GII NOT DETECTED NOT DETECTED Final   Rotavirus A NOT DETECTED NOT DETECTED Final   Sapovirus (I, II, IV, and V) NOT DETECTED NOT DETECTED Final    Comment: Performed at [facility], [address] [place], [place] [address]  C Difficile Quick Screen w PCR reflex     Status: None   Collection Time: 07/13/20  2:12 AM   Specimen: STOOL  Result Value Ref Range Status   C Diff antigen NEGATIVE NEGATIVE Final   C Diff  toxin NEGATIVE NEGATIVE Final   C Diff interpretation No C. difficile detected.  Final    Comment: Performed at [facility], [address] [place], [place] [address]      Radiology Studies: MR BRAIN W WO CONTRAST  Result Date: 07/15/2020 CLINICAL DATA:  Lymphoma, staging EXAM: MRI HEAD WITHOUT AND WITH CONTRAST TECHNIQUE: Multiplanar, multiecho pulse sequences of the brain and surrounding structures were obtained without and with intravenous contrast. CONTRAST:  22mL GADAVIST GADOBUTROL 1 MMOL/ML IV SOLN COMPARISON:  None. FINDINGS: Brain: There is no acute infarction or intracranial hemorrhage. There is no intracranial mass, mass effect, or edema. There is no hydrocephalus or extra-axial fluid collection. Ventricles and sulci are normal in size and configuration. Small focus of right subinsular T2 hyperintensity likely reflects nonspecific gliosis/demyelination. No abnormal enhancement. Vascular: Major vessel flow voids at the skull base are preserved. Skull and upper cervical spine: Normal marrow signal is preserved. Sinuses/Orbits: Minor mucosal thickening.  Orbits are unremarkable. Other: Sella is unremarkable.  Mastoid air cells are clear. IMPRESSION: No evidence of metastatic disease. Electronically Signed   By: Macy Mis M.D.   On: 07/15/2020 12:04     Scheduled Meds: . Chlorhexidine Gluconate Cloth  6 each Topical Daily  . feeding supplement  237 mL Oral TID BM   Continuous Infusions: . ceFEPime (MAXIPIME) IV 2 g (07/15/20 1634)  . metronidazole 500 mg (07/15/20 1358)     LOS: 3 days     Enzo Bi, MD [facility] If 7PM-7AM, please contact night-coverage 07/15/2020, 6:12 PM

## 2020-07-15 NOTE — Progress Notes (Signed)
[place] INFECTIOUS DISEASE PROGRESS NOTE Date of Admission:  07/12/2020     ID: Christopher Mills is a 60 y.o. male  neutropenic fever and Salmonella enterocolitis hPrincipal Problem:   Neutropenic fever (HCC) Active Problems:   Diarrhea   Enterocolitis   Acute hyponatremia   Abdominal pain   Acute kidney injury ([place])   Thrombocytopenia (HCC)   Generalized weakness   Dehydration   Hypertension   Sepsis ([place])   Salmonella gastroenteritis   Protein-calorie malnutrition, severe   Subjective: No fevers, wbc up a little. He had a loose stool yest after drinking ensure but no futher and no abd pain, nv.  ROS  Eleven systems are reviewed and negative except per hpi  Medications:  Antibiotics Given (last 72 hours)    Date/Time Action Medication Dose Rate   07/12/20 1746 New Bag/Given   metroNIDAZOLE (FLAGYL) IVPB 500 mg 500 mg 100 mL/hr   07/12/20 1754 New Bag/Given   ceFEPIme (MAXIPIME) 2 g in sodium chloride 0.9 % 100 mL IVPB 2 g 200 mL/hr   07/13/20 0211 New Bag/Given   ceFEPIme (MAXIPIME) 2 g in sodium chloride 0.9 % 100 mL IVPB 2 g 200 mL/hr   07/13/20 0455 New Bag/Given   metroNIDAZOLE (FLAGYL) IVPB 500 mg 500 mg 100 mL/hr   07/13/20 0834 New Bag/Given   ceFEPIme (MAXIPIME) 2 g in sodium chloride 0.9 % 100 mL IVPB 2 g 200 mL/hr   07/13/20 1637 New Bag/Given   metroNIDAZOLE (FLAGYL) IVPB 500 mg 500 mg 100 mL/hr   07/13/20 1911 New Bag/Given   ceFEPIme (MAXIPIME) 2 g in sodium chloride 0.9 % 100 mL IVPB 2 g 200 mL/hr   07/13/20 2221 New Bag/Given   metroNIDAZOLE (FLAGYL) IVPB 500 mg 500 mg 100 mL/hr   07/14/20 0208 New Bag/Given   ceFEPIme (MAXIPIME) 2 g in sodium chloride 0.9 % 100 mL IVPB 2 g 200 mL/hr   07/14/20 0510 New Bag/Given   metroNIDAZOLE (FLAGYL) IVPB 500 mg 500 mg 100 mL/hr   07/14/20 [id number] New Bag/Given   ceFEPIme (MAXIPIME) 2 g in sodium chloride 0.9 % 100 mL IVPB 2 g 200 mL/hr   07/14/20 1418 New Bag/Given   metroNIDAZOLE (FLAGYL) IVPB 500 mg 500 mg  100 mL/hr   07/14/20 1758 New Bag/Given   ceFEPIme (MAXIPIME) 2 g in sodium chloride 0.9 % 100 mL IVPB 2 g 200 mL/hr   07/14/20 2138 New Bag/Given   metroNIDAZOLE (FLAGYL) IVPB 500 mg 500 mg 100 mL/hr   07/15/20 0223 New Bag/Given   ceFEPIme (MAXIPIME) 2 g in sodium chloride 0.9 % 100 mL IVPB 2 g 200 mL/hr   07/15/20 0529 New Bag/Given   metroNIDAZOLE (FLAGYL) IVPB 500 mg 500 mg 100 mL/hr   07/15/20 0824 New Bag/Given   ceFEPIme (MAXIPIME) 2 g in sodium chloride 0.9 % 100 mL IVPB 2 g 200 mL/hr     . Chlorhexidine Gluconate Cloth  6 each Topical Daily  . feeding supplement  237 mL Oral TID BM    Objective: Vital signs in last 24 hours: Temp:  [97.3 F (36.3 C)-98 F (36.7 C)] 97.6 F (36.4 C) (05/03 1122) Pulse Rate:  [74-84] 74 (05/03 1122) Resp:  [15-18] 18 (05/03 1122) BP: (109-134)/(74-81) 117/74 (05/03 1122) SpO2:  [99 %-100 %] 100 % (05/03 1122) Weight:  [82 kg] 82 kg (05/03 0500) Constitutional: He is oriented to person, place, and time. He appears well-developed and well-nourished. No distress.  HENT:  Mouth/Throat: Oropharynx is clear and  moist. No oropharyngeal exudate.  Cardiovascular: Normal rate, regular rhythm and normal heart sounds. Exam reveals no gallop and no friction rub.  No murmur heard.  Pulmonary/Chest: Effort normal and breath sounds normal. No respiratory distress. He has no wheezes.  Abdominal: Soft mild distention, mild ttp Lymphadenopathy:  He has no cervical adenopathy.  Neurological: He is alert and oriented to person, place, and time.  Skin: Skin is warm and dry. No rash noted. No erythema.  Psychiatric: He has a normal mood and affect. His behavior is normal.   Lab Results Recent Labs    07/14/20 0458 07/15/20 0523  WBC 0.7* 1.3*  HGB 9.7* 10.0*  HCT 27.4* 28.5*  NA 131* 134*  K 3.3* 3.4*  CL 100 103  CO2 23 23  BUN 13 10  CREATININE 0.82 0.76    Microbiology: Results for orders placed or performed during the hospital encounter  of 07/12/20  Culture, blood (Routine x 2)     Status: None (Preliminary result)   Collection Time: 07/12/20  9:40 AM   Specimen: BLOOD  Result Value Ref Range Status   Specimen Description BLOOD LEFT ANTECUBITAL  Final   Special Requests   Final    BOTTLES DRAWN AEROBIC AND ANAEROBIC Blood Culture results may not be optimal due to an inadequate volume of blood received in culture bottles   Culture   Final    NO GROWTH 3 DAYS Performed at [facility], [address], [place], [place] [address]    Report Status PENDING  Incomplete  Culture, blood (Routine x 2)     Status: None (Preliminary result)   Collection Time: 07/12/20  9:40 AM   Specimen: BLOOD  Result Value Ref Range Status   Specimen Description BLOOD PART  Final   Special Requests   Final    BOTTLES DRAWN AEROBIC AND ANAEROBIC Blood Culture adequate volume   Culture   Final    NO GROWTH 3 DAYS Performed at [facility], [address], [place], [place] [address]    Report Status PENDING  Incomplete  Resp Panel by RT-PCR (Flu A&B, Covid) Nasopharyngeal Swab     Status: None   Collection Time: 07/12/20 10:02 AM   Specimen: Nasopharyngeal Swab; Nasopharyngeal(NP) swabs in vial transport medium  Result Value Ref Range Status   SARS Coronavirus 2 by RT PCR NEGATIVE NEGATIVE Final    Comment: (NOTE) SARS-CoV-2 target nucleic acids are NOT DETECTED.  The SARS-CoV-2 RNA is generally detectable in upper respiratory specimens during the acute phase of infection. The lowest concentration of SARS-CoV-2 viral copies this assay can detect is 138 copies/mL. A negative result does not preclude SARS-Cov-2 infection and should not be used as the sole basis for treatment or other patient management decisions. A negative result may occur with  improper specimen collection/handling, submission of specimen other than nasopharyngeal swab, presence of viral mutation(s) within the areas targeted by this assay, and  inadequate number of viral copies(<138 copies/mL). A negative result must be combined with clinical observations, patient history, and epidemiological information. The expected result is Negative.  Fact Sheet for Patients:  EntrepreneurPulse.com.au  Fact Sheet for Healthcare Providers:  IncredibleEmployment.be  This test is no t yet approved or cleared by the [place] FDA and  has been authorized for detection and/or diagnosis of SARS-CoV-2 by FDA under an Emergency Use Authorization (EUA). This EUA will remain  in effect (meaning this test can be used) for the duration of the COVID-19 declaration under  Section 564(b)(1) of the Act, 21 U.S.C.section 360bbb-3(b)(1), unless the authorization is terminated  or revoked sooner.       Influenza A by PCR NEGATIVE NEGATIVE Final   Influenza B by PCR NEGATIVE NEGATIVE Final    Comment: (NOTE) The Xpert Xpress SARS-CoV-2/FLU/RSV plus assay is intended as an aid in the diagnosis of influenza from Nasopharyngeal swab specimens and should not be used as a sole basis for treatment. Nasal washings and aspirates are unacceptable for Xpert Xpress SARS-CoV-2/FLU/RSV testing.  Fact Sheet for Patients: EntrepreneurPulse.com.au  Fact Sheet for Healthcare Providers: IncredibleEmployment.be  This test is not yet approved or cleared by the [place] FDA and has been authorized for detection and/or diagnosis of SARS-CoV-2 by FDA under an Emergency Use Authorization (EUA). This EUA will remain in effect (meaning this test can be used) for the duration of the COVID-19 declaration under Section 564(b)(1) of the Act, 21 U.S.C. section 360bbb-3(b)(1), unless the authorization is terminated or revoked.  Performed at [facility], [address] [place], [place] [address]   Gastrointestinal Panel by PCR , Stool     Status: Abnormal   Collection Time: 07/13/20   2:12 AM   Specimen: Stool  Result Value Ref Range Status   Campylobacter species NOT DETECTED NOT DETECTED Final   Plesimonas shigelloides NOT DETECTED NOT DETECTED Final   Salmonella species DETECTED (A) NOT DETECTED Final    Comment: RESULT CALLED TO, READ BACK BY AND VERIFIED WITH: ALEXIS STOGDEN ON 07/13/20 AT 0353 QSD    Yersinia enterocolitica NOT DETECTED NOT DETECTED Final   Vibrio species NOT DETECTED NOT DETECTED Final   Vibrio cholerae NOT DETECTED NOT DETECTED Final   Enteroaggregative E coli (EAEC) NOT DETECTED NOT DETECTED Final   Enteropathogenic E coli (EPEC) NOT DETECTED NOT DETECTED Final   Enterotoxigenic E coli (ETEC) NOT DETECTED NOT DETECTED Final   Shiga like toxin producing E coli (STEC) NOT DETECTED NOT DETECTED Final   Shigella/Enteroinvasive E coli (EIEC) NOT DETECTED NOT DETECTED Final   Cryptosporidium NOT DETECTED NOT DETECTED Final   Cyclospora cayetanensis NOT DETECTED NOT DETECTED Final   Entamoeba histolytica NOT DETECTED NOT DETECTED Final   Giardia lamblia NOT DETECTED NOT DETECTED Final   Adenovirus F40/41 NOT DETECTED NOT DETECTED Final   Astrovirus NOT DETECTED NOT DETECTED Final   Norovirus GI/GII NOT DETECTED NOT DETECTED Final   Rotavirus A NOT DETECTED NOT DETECTED Final   Sapovirus (I, II, IV, and V) NOT DETECTED NOT DETECTED Final    Comment: Performed at [facility], [address] [place], [place] [address]  C Difficile Quick Screen w PCR reflex     Status: None   Collection Time: 07/13/20  2:12 AM   Specimen: STOOL  Result Value Ref Range Status   C Diff antigen NEGATIVE NEGATIVE Final   C Diff toxin NEGATIVE NEGATIVE Final   C Diff interpretation No C. difficile detected.  Final    Comment: Performed at [facility], [address] [place], [place] [address]    Studies/Results: MR BRAIN W WO CONTRAST  Result Date: 07/15/2020 CLINICAL DATA:  Lymphoma, staging EXAM: MRI HEAD WITHOUT AND WITH CONTRAST TECHNIQUE:  Multiplanar, multiecho pulse sequences of the brain and surrounding structures were obtained without and with intravenous contrast. CONTRAST:  58mL GADAVIST GADOBUTROL 1 MMOL/ML IV SOLN COMPARISON:  None. FINDINGS: Brain: There is no acute infarction or intracranial hemorrhage. There is no intracranial mass, mass effect, or edema. There is no hydrocephalus or  extra-axial fluid collection. Ventricles and sulci are normal in size and configuration. Small focus of right subinsular T2 hyperintensity likely reflects nonspecific gliosis/demyelination. No abnormal enhancement. Vascular: Major vessel flow voids at the skull base are preserved. Skull and upper cervical spine: Normal marrow signal is preserved. Sinuses/Orbits: Minor mucosal thickening.  Orbits are unremarkable. Other: Sella is unremarkable.  Mastoid air cells are clear. IMPRESSION: No evidence of metastatic disease. Electronically Signed   By: Macy Mis M.D.   On: 07/15/2020 12:04    Assessment/Plan: Christopher Mills is a 60 y.o. 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  He was also having episodes of diarrhea.  He had a stool PCR done which was positive for Salmonella.  Blood cultures have been no growth to date.  CT of the abdomen did show thickening and enhancement of the colon and multiple segments of the small bowel.  There was improvement of intra-abdominal intrapelvic lymphadenopathy compared to recent CT. Of note he does have a port in place. Since admission he is defervesced.    5/3 no fevers, anc up to 0.5  Recommendations Cont cefepime and flagyl Once neutropenia resolved would change to oral quinolone.  However if remains > 0.5 and remains afebrile and otherwise well could transition earlier. Would plan on at least 14 days treatment given immunocompromised state.   Thank you very much for the consult. Will  follow with you.  Leonel Ramsay   07/15/2020, 1:03 PM

## 2020-07-15 NOTE — Consult Note (Signed)
[place]  [phone] [phone]   Name: Christopher Mills Date: 07/15/2020 MRN: [id number]  DOB: 14-Aug-1960  Patient Care Team: [name], MD as PCP - General (Internal Medicine) Sindy Guadeloupe, MD as Consulting Physician (Hematology and Oncology)    REASON FOR CONSULTATION: Christopher Mills is a 60 y.o. male with multiple medical problems including high-grade B-cell lymphoma undergoing chemotherapy, who was admitted to the hospital 07/12/2020 with neutropenic fever.  Palliative care was consulted up address goals.  SOCIAL HISTORY:     reports that he quit smoking about 4 weeks ago. His smoking use included cigarettes. He has never used smokeless tobacco. He reports previous alcohol use. He reports previous drug use. Drug: Marijuana.  Patient has never married.  He lives at home with his daughter and granddaughter.  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  ADVANCE DIRECTIVES:  Does not have  CODE STATUS: Full code  PAST MEDICAL HISTORY: Past Medical History:  Diagnosis Date  . Acute kidney injury ([place]) 07/12/2020  . Dyspnea   . Hypertension   . Lymphoma of lymph nodes of neck ([place]) 06/18/2020    PAST SURGICAL HISTORY:  Past Surgical History:  Procedure Laterality Date  . BONE MARROW BIOPSY    . COLONOSCOPY    . EXCISION MASS NECK Right 06/12/2020   Procedure: EXCISION MASS NECK;  Surgeon: Jules Husbands, MD;  Location: [facility] ORS;  Service: General;  Laterality: Right;  . HERNIA REPAIR Right    at age 32-RIH  . PORTA CATH INSERTION    . PORTACATH PLACEMENT Right 06/24/2020   Procedure: INSERTION PORT-A-CATH;  Surgeon: Jules Husbands, MD;  Location: [facility] ORS;  Service: General;  Laterality: Right;    HEMATOLOGY/ONCOLOGY HISTORY:  Oncology History  High grade B-cell lymphoma ([place])  06/18/2020 Initial Diagnosis   High grade B-cell lymphoma ([place])   06/18/2020 Cancer Staging   Staging form:  Hodgkin and Non-Hodgkin Lymphoma, AJCC 8th Edition - Clinical stage from 06/18/2020: Stage IV (Diffuse large B-cell lymphoma) - Signed by Sindy Guadeloupe, MD on 06/18/2020 Stage prefix: Initial diagnosis   06/21/2020 - 06/21/2020 Chemotherapy         07/03/2020 - 07/08/2020 Chemotherapy         07/28/2020 -  Chemotherapy    Patient is on Treatment Plan:  NON-HODGKINS LYMPHOMA R-EPOCH Q21D        ALLERGIES:  has No Known Allergies.  MEDICATIONS:  Current Facility-Administered Medications  Medication Dose Route Frequency Provider Last Rate Last Admin  . acetaminophen (TYLENOL) tablet 650 mg  650 mg Oral Q6H PRN Howerter, Justin B, DO       Or  . acetaminophen (TYLENOL) suppository 650 mg  650 mg Rectal Q6H PRN Howerter, Justin B, DO      . ceFEPIme (MAXIPIME) 2 g in sodium chloride 0.9 % 100 mL IVPB  2 g Intravenous Q8H Lockie Mola B, RPH 200 mL/hr at 07/15/20 0824 2 g at 07/15/20 0824  . Chlorhexidine Gluconate Cloth 2 % PADS 6 each  6 each Topical Daily Enzo Bi, MD   6 each at 07/15/20 [id number]  . feeding supplement (ENSURE ENLIVE / ENSURE PLUS) liquid 237 mL  237 mL Oral TID BM Enzo Bi, MD   237 mL at 07/14/20 1415  . lip balm (BLISTEX) ointment   Topical PRN Dorothe Pea, RPH      . metroNIDAZOLE (FLAGYL) IVPB 500 mg  500  mg Intravenous Q8H Howerter, Justin B, DO 100 mL/hr at 07/15/20 1358 500 mg at 07/15/20 1358  . ondansetron (ZOFRAN) injection 4 mg  4 mg Intravenous Q6H PRN Howerter, Justin B, DO      . zolpidem (AMBIEN) tablet 5 mg  5 mg Oral QHS PRN Athena Masse, MD   5 mg at 07/13/20 2221    VITAL SIGNS: BP (!) 105/51 (BP Location: Right Arm)   Pulse 72   Temp 98.3 F (36.8 C) (Oral)   Resp 16   Ht _0  (1.854 m)   Wt 180 lb 12.4 oz (82 kg)   SpO2 100%   BMI 23.85 kg/m  Filed Weights   07/12/20 0903 07/14/20 0500 07/15/20 0500  Weight: 175 lb (79.4 kg) 176 lb 12.9 oz (80.2 kg) 180 lb 12.4 oz (82 kg)    Estimated body mass index is 23.85 kg/m as calculated  from the following:   Height as of this encounter: _1  (1.854 m).   Weight as of this encounter: 180 lb 12.4 oz (82 kg).  LABS: CBC:    Component Value Date/Time   WBC 1.3 (LL) 07/15/2020 0523   HGB 10.0 (L) 07/15/2020 0523   HGB 13.1 07/03/2020 0935   HCT 28.5 (L) 07/15/2020 0523   HCT 40.1 02/19/2013 1716   PLT 76 (L) 07/15/2020 0523   PLT 274 02/19/2013 1716   MCV 84.1 07/15/2020 0523   MCV 86 02/19/2013 1716   NEUTROABS 0.5 (L) 07/15/2020 0523   LYMPHSABS 0.6 (L) 07/15/2020 0523   MONOABS 0.2 07/15/2020 0523   EOSABS 0.0 07/15/2020 0523   BASOSABS 0.0 07/15/2020 0523   Comprehensive Metabolic Panel:    Component Value Date/Time   NA 134 (L) 07/15/2020 0523   NA 135 (L) 02/19/2013 1716   K 3.4 (L) 07/15/2020 0523   K 3.5 02/19/2013 1716   CL 103 07/15/2020 0523   CL 102 02/19/2013 1716   CO2 23 07/15/2020 0523   CO2 27 02/19/2013 1716   BUN 10 07/15/2020 0523   BUN 16 02/19/2013 1716   CREATININE 0.76 07/15/2020 0523   CREATININE 1.12 02/19/2013 1716   GLUCOSE 97 07/15/2020 0523   GLUCOSE 94 02/19/2013 1716   CALCIUM 8.7 (L) 07/15/2020 0523   CALCIUM 9.6 02/19/2013 1716   AST 21 07/13/2020 0835   AST 33 02/19/2013 1716   ALT 26 07/13/2020 0835   ALT 41 02/19/2013 1716   ALKPHOS 60 07/13/2020 0835   ALKPHOS 79 02/19/2013 1716   BILITOT 1.2 07/13/2020 0835   BILITOT 0.4 02/19/2013 1716   PROT 6.3 (L) 07/13/2020 0835   PROT 7.9 02/19/2013 1716   ALBUMIN 2.9 (L) 07/13/2020 0835   ALBUMIN 4.2 02/19/2013 1716    RADIOGRAPHIC STUDIES: DG Chest 2 View  Result Date: 07/12/2020 CLINICAL DATA:  60 year old male with weakness. Currently undergoing treatment for lymphoma. EXAM: CHEST - 2 VIEW COMPARISON:  06/24/2020 and prior chest radiographs FINDINGS: The cardiomediastinal silhouette is unchanged. Slightly prominent SUPERIOR mediastinum again noted. A RIGHT IJ Port-A-Cath is noted with tip overlying the mid SVC. There is no evidence of focal airspace disease,  pulmonary edema, suspicious pulmonary nodule/mass, pleural effusion, or pneumothorax. No acute bony abnormalities are identified. IMPRESSION: No acute cardiopulmonary disease. Electronically Signed   By: Margarette Canada M.D.   On: 07/12/2020 09:57   MR BRAIN W WO CONTRAST  Result Date: 07/15/2020 CLINICAL DATA:  Lymphoma, staging EXAM: MRI HEAD WITHOUT AND WITH CONTRAST TECHNIQUE: Multiplanar, multiecho pulse sequences  of the brain and surrounding structures were obtained without and with intravenous contrast. CONTRAST:  65m GADAVIST GADOBUTROL 1 MMOL/ML IV SOLN COMPARISON:  None. FINDINGS: Brain: There is no acute infarction or intracranial hemorrhage. There is no intracranial mass, mass effect, or edema. There is no hydrocephalus or extra-axial fluid collection. Ventricles and sulci are normal in size and configuration. Small focus of right subinsular T2 hyperintensity likely reflects nonspecific gliosis/demyelination. No abnormal enhancement. Vascular: Major vessel flow voids at the skull base are preserved. Skull and upper cervical spine: Normal marrow signal is preserved. Sinuses/Orbits: Minor mucosal thickening.  Orbits are unremarkable. Other: Sella is unremarkable.  Mastoid air cells are clear. IMPRESSION: No evidence of metastatic disease. Electronically Signed   By: PMacy MisM.D.   On: 07/15/2020 12:04   NM Cardiac Muga Rest  Result Date: 07/01/2020 CLINICAL DATA:  High-grade B-cell lymphoma, pre cardiotoxic chemotherapy EXAM: NUCLEAR MEDICINE CARDIAC BLOOD POOL IMAGING (MUGA) TECHNIQUE: Cardiac multi-gated acquisition was performed at rest following intravenous injection of Tc-924mabeled red blood cells. RADIOPHARMACEUTICALS:  21.059 mCi Tc-9957mrtechnetate in-vitro labeled red blood cells IV COMPARISON:  None FINDINGS: Calculated LEFT ventricular ejection fraction is 54.4%, normal. Study was obtained at a cardiac rate of 56 bpm. Patient was rhythmic during imaging. Cine analysis of the LEFT  ventricle in 3 projections demonstrates normal LV wall motion. IMPRESSION: Normal LEFT ventricular ejection fraction of 54.4%. Normal LEFT ventricular wall motion. Electronically Signed   By: MarLavonia DanaD.   On: 07/01/2020 15:16   CT ABDOMEN PELVIS W CONTRAST  Result Date: 07/12/2020 CLINICAL DATA:  Abdominal pain and fever.  Nausea and diarrhea. EXAM: CT ABDOMEN AND PELVIS WITH CONTRAST TECHNIQUE: Multidetector CT imaging of the abdomen and pelvis was performed using the standard protocol following bolus administration of intravenous contrast. CONTRAST:  100m84mNIPAQUE IOHEXOL 300 MG/ML  SOLN COMPARISON:  PET CT dated 05/26/2020. FINDINGS: Lower chest: No acute abnormality. Hepatobiliary: No focal liver abnormality is seen. Gallbladder is unremarkable. No bile duct dilatation. Pancreas: Unremarkable. No pancreatic ductal dilatation or surrounding inflammatory changes. Spleen: Multiple hypodense masses within the spleen, largest measuring 2.8 cm, FDG avid on recent PET-CT indicating lymphoproliferative disorder and presumably related to the recent diagnosis of lymphoma. Adrenals/Urinary Tract: Adrenal glands appear normal. Kidneys are unremarkable without suspicious mass, stone or hydronephrosis. No perinephric fluid. No ureteral or bladder calculi are identified. Bladder is unremarkable, partially decompressed. Stomach/Bowel: Thickening of the walls of the majority of the colon. Additional thickening and enhancement of the walls of multiple segments of small bowel, most prominent within the jejunum and ileum. Fluid is seen throughout the small bowel, with associated air-fluid levels. Stomach is unremarkable.  Appendix is normal in caliber. Vascular/Lymphatic: Aortic atherosclerosis. No acute appearing vascular abnormality. The intra-abdominal intrapelvic lymphadenopathy demonstrated on recent PET-CT has significantly improved in the interval. Representative lymphadenopathy at the RIGHT pelvic sidewall  previously measured 3.5 cm greatest thickness, now measuring 1.6 cm greatest thickness. Lymph node within the LEFT periaortic retroperitoneum previously measured 1.3 cm short axis dimension, now measuring 0.7 cm short axis. Reproductive: Prostate is unremarkable. Other: No free fluid or abscess collection is seen. No free intraperitoneal air. Musculoskeletal: No acute appearing osseous abnormality. IMPRESSION: 1. Significant thickening and enhancement of the walls of the majority of the colon and multiple segments of small bowel, most prominent within the jejunum and ileum. Differential includes diffuse enterocolitis of infectious or inflammatory nature, typhlitis, radiation induced enterocolitis (if corresponding history of radiation) and graft-versus-host disease (if any corresponding  history of bone marrow transplant). 2. The intra-abdominal intrapelvic lymphadenopathy demonstrated on recent PET-CT has significantly improved in the interval indicating a good response to interval treatment. Measurements provided above. 3. Multiple hypodense masses within the spleen, largest measuring 2.8 cm, FDG avid on recent PET-CT indicating lymphoproliferative disorder and also presumably related to the recent diagnosis of lymphoma. Aortic Atherosclerosis (ICD10-I70.0). Electronically Signed   By: Franki Cabot M.D.   On: 07/12/2020 12:32   DG CHEST PORT 1 VIEW  Result Date: 06/24/2020 CLINICAL DATA:  Status post port placement EXAM: PORTABLE CHEST 1 VIEW COMPARISON:  10/21/2019 FINDINGS: Right-sided chest wall port is noted. Catheter tip is seen in the mid superior vena cava in satisfactory position. No pneumothorax is noted. Persistent fullness in the right paratracheal space is noted consistent with the patient's known history. Cardiac shadow is within normal limits. Mild bibasilar atelectasis is noted related to a poor inspiratory effort. No bony abnormality is seen. IMPRESSION: No pneumothorax following port  placement. Electronically Signed   By: Inez Catalina M.D.   On: 06/24/2020 11:28   CT BONE MARROW BIOPSY & ASPIRATION  Result Date: 06/25/2020 INDICATION: New diagnosis of lymphoma. Please perform CT-guided bone marrow biopsy for tissue diagnostic purposes. EXAM: CT-GUIDED BONE MARROW BIOPSY AND ASPIRATION MEDICATIONS: None ANESTHESIA/SEDATION: Fentanyl 50 mcg IV; Versed 1 mg IV Sedation Time: 10 Minutes; The patient was continuously monitored during the procedure by the interventional radiology nurse under my direct supervision. COMPLICATIONS: None immediate. PROCEDURE: Informed consent was obtained from the patient following an explanation of the procedure, risks, benefits and alternatives. The patient understands, agrees and consents for the procedure. All questions were addressed. A time out was performed prior to the initiation of the procedure. The patient was positioned prone and non-contrast localization CT was performed of the pelvis to demonstrate the iliac marrow spaces. The operative site was prepped and draped in the usual sterile fashion. Under sterile conditions and local anesthesia, a 22 gauge spinal needle was utilized for procedural planning. Next, an 11 gauge coaxial bone biopsy needle was advanced into the left iliac marrow space. Needle position was confirmed with CT imaging. Initially, a bone marrow aspiration was performed. Next, a bone marrow biopsy was obtained with the 11 gauge outer bone marrow device. Samples were prepared with the cytotechnologist and deemed adequate. The needle was removed and superficial hemostasis was obtained with manual compression. A dressing was applied. The patient tolerated the procedure well without immediate post procedural complication. IMPRESSION: Successful CT guided left iliac bone marrow aspiration and core biopsy. Electronically Signed   By: Sandi Mariscal M.D.   On: 06/25/2020 10:30   DG C-Arm 1-60 Min-No Report  Result Date: 06/24/2020 Fluoroscopy  was utilized by the requesting physician.  No radiographic interpretation.   DG FLUORO GUIDED LOC OF NEEDLE/CATH TIP FOR SPINAL INJECT LT  Result Date: 07/08/2020 CLINICAL DATA:  Non-Hodgkin's lymphoma EXAM: DIAGNOSTIC LUMBAR PUNCTURE UNDER FLUOROSCOPIC GUIDANCE COMPARISON:  None FLUOROSCOPY TIME:  Fluoroscopy Time:  30 seconds Radiation Exposure Index (if provided by the fluoroscopic device): 4.9 mGy Number of Acquired Spot Images: 0 PROCEDURE: Informed consent was obtained from the patient prior to the procedure, including potential complications of headache, allergy, and pain. With the patient prone, the lower back was prepped with Betadine. 1% Lidocaine was used for local anesthesia. Lumbar puncture was performed at the L4-5 level using a 22 gauge needle with return of clear 5 ml of CSF were obtained for laboratory studies. Subsequently, 12 mg of methotrexate  in 5 mL was hand injected into the thecal sac. The patient tolerated the procedure well and there were no apparent complications. IMPRESSION: Successful fluoroscopic guided lumbar puncture for methotrexate injection. Electronically Signed   By: Kathreen Devoid   On: 07/08/2020 13:54    PERFORMANCE STATUS (ECOG) : 1 - Symptomatic but completely ambulatory  Review of Systems Unless otherwise noted, a complete review of systems is negative.  Physical Exam General: NAD Pulmonary: Unlabored Extremities: no edema, no joint deformities Skin: no rashes Neurological: Weakness but otherwise nonfocal  IMPRESSION: I met with patient to discuss goals.  I introduced palliative care services and attempted to establish therapeutic rapport.  Patient denies distressing symptoms.  He reports that he is anxious to return home.  He says that this is his first hospitalization in 59 years of life and he feels somewhat restricted from his normal activity.    He describes how his diagnosis with cancer has changed his lifestyle.  He can no longer routinely plan  activities or work, as he is not sure what may change week to week.  Despite that, he says he is tolerating chemotherapy well and describes an overall high quality of life.    He seems to have an optimistic outlook and reports knowing that he will "beat this cancer."  Of note, his sister recently completed chemotherapy for breast cancer.  She has been a strong supportive figure through this cancer journey.  Chemotherapy is with curative intent and patient remains committed to the current scope of treatment.  He says his goal is to get better. I offered encouragement and emotional support.   PLAN: -Continue current scope of treatment -Will be happy to follow patient in the clinic if needed   Case and plan discussed with Dr. Janese Banks    Time Total: 60 minutes  Visit consisted of counseling and education dealing with the complex and emotionally intense issues of symptom management and palliative care in the setting of serious and potentially life-threatening illness.Greater than 50%  of this time was spent counseling and coordinating care related to the above assessment and plan.  Signed by: Altha Harm, PhD, NP-C

## 2020-07-15 NOTE — Progress Notes (Signed)
Hematology/Oncology Consult note [facility]  [phone] [phone]  Patient Care Team: [name], MD as PCP - General (Internal Medicine) Sindy Guadeloupe, MD as Consulting Physician (Hematology and Oncology)   Name of the patient: Christopher Mills  [id number]  October 14, 1960   Date of visit: 07/15/20  Interval history- denies any abdominal pain. Had a formed BM today. No fever  ECOG PS- 1 Pain scale- 0   Review of systems- Review of Systems  Constitutional: Positive for malaise/fatigue. Negative for chills, fever and weight loss.  HENT: Negative for congestion, ear discharge and nosebleeds.   Eyes: Negative for blurred vision.  Respiratory: Negative for cough, hemoptysis, sputum production, shortness of breath and wheezing.   Cardiovascular: Negative for chest pain, palpitations, orthopnea and claudication.  Gastrointestinal: Negative for abdominal pain, blood in stool, constipation, diarrhea, heartburn, melena, nausea and vomiting.  Genitourinary: Negative for dysuria, flank pain, frequency, hematuria and urgency.  Musculoskeletal: Negative for back pain, joint pain and myalgias.  Skin: Negative for rash.  Neurological: Negative for dizziness, tingling, focal weakness, seizures, weakness and headaches.  Endo/Heme/Allergies: Does not bruise/bleed easily.  Psychiatric/Behavioral: Negative for depression and suicidal ideas. The patient does not have insomnia.       No Known Allergies   Past Medical History:  Diagnosis Date  . Acute kidney injury ([place]) 07/12/2020  . Dyspnea   . Hypertension   . 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 BONE MARROW BIOPSY    . COLONOSCOPY    . EXCISION MASS NECK Right 06/12/2020   Procedure: EXCISION MASS NECK;  Surgeon: Jules Husbands, MD;  Location: [facility] ORS;  Service: General;  Laterality: Right;  . HERNIA REPAIR Right    at age  72-RIH  . PORTA CATH INSERTION    . PORTACATH PLACEMENT Right 06/24/2020   Procedure: INSERTION PORT-A-CATH;  Surgeon: Jules Husbands, MD;  Location: [facility] ORS;  Service: General;  Laterality: Right;    Social History   Socioeconomic History  . Marital status: Single    Spouse name: Not on file  . Number of children: Not on file  . Years of education: Not on file  . Highest education level: Not on file  Occupational History  . Not on file  Tobacco Use  . Smoking status: Former Smoker    Types: Cigarettes    Quit date: 06/13/2020    Years since quitting: 0.0  . Smokeless tobacco: Never Used  Vaping Use  . Vaping Use: Never used  Substance and Sexual Activity  . Alcohol use: Not Currently  . Drug use: Not Currently    Types: Marijuana  . Sexual activity: Not Currently  Other Topics Concern  . Not on file  Social History Narrative   Has daughter and grandson in the home   Social Determinants of Health   Financial Resource Strain: Not on file  Food Insecurity: Not on file  Transportation Needs: Not on file  Physical Activity: Not on file  Stress: Not on file  Social Connections: Not on file  Intimate Partner Violence: Not on file    Family History  Problem Relation Age of Onset  . Anemia Mother   . Hypertension Mother   . Goiter Mother   . Cancer Father   . Cancer Sister   . Multiple sclerosis Sister      Current Facility-Administered Medications:  .  acetaminophen (  TYLENOL) tablet 650 mg, 650 mg, Oral, Q6H PRN **OR** acetaminophen (TYLENOL) suppository 650 mg, 650 mg, Rectal, Q6H PRN, Howerter, Justin B, DO .  ceFEPIme (MAXIPIME) 2 g in sodium chloride 0.9 % 100 mL IVPB, 2 g, Intravenous, Q8H, Benita Gutter, RPH, Last Rate: 200 mL/hr at 07/15/20 0824, 2 g at 07/15/20 0824 .  Chlorhexidine Gluconate Cloth 2 % PADS 6 each, 6 each, Topical, Daily, Enzo Bi, MD, 6 each at 07/15/20 [phone] .  feeding supplement (ENSURE ENLIVE / ENSURE PLUS) liquid 237 mL, 237 mL,  Oral, TID BM, Enzo Bi, MD, 237 mL at 07/14/20 1415 .  lip balm (BLISTEX) ointment, , Topical, PRN, Dorothe Pea, RPH .  metroNIDAZOLE (FLAGYL) IVPB 500 mg, 500 mg, Intravenous, Q8H, Howerter, Justin B, DO, Last Rate: 100 mL/hr at 07/15/20 1358, 500 mg at 07/15/20 1358 .  ondansetron (ZOFRAN) injection 4 mg, 4 mg, Intravenous, Q6H PRN, Howerter, Justin B, DO .  zolpidem (AMBIEN) tablet 5 mg, 5 mg, Oral, QHS PRN, Athena Masse, MD, 5 mg at 07/13/20 2221  Physical exam:  Vitals:   07/15/20 0500 07/15/20 0803 07/15/20 1122 07/15/20 1458  BP:  134/81 117/74 (!) 105/51  Pulse:  76 74 72  Resp:  '18 18 16  ' Temp:  (!) 97.3 F (36.3 C) 97.6 F (36.4 C) 98.3 F (36.8 C)  TempSrc:  Oral Oral Oral  SpO2:  100% 100% 100%  Weight: 180 lb 12.4 oz (82 kg)     Height:       Physical Exam Constitutional:      General: He is not in acute distress. Cardiovascular:     Rate and Rhythm: Normal rate and regular rhythm.     Heart sounds: Normal heart sounds.  Pulmonary:     Effort: Pulmonary effort is normal.     Breath sounds: Normal breath sounds.  Abdominal:     General: Bowel sounds are normal.     Palpations: Abdomen is soft.  Skin:    General: Skin is warm and dry.  Neurological:     Mental Status: He is alert and oriented to person, place, and time.      CMP Latest Ref Rng & Units 07/15/2020  Glucose 70 - 99 mg/dL 97  BUN 6 - 20 mg/dL 10  Creatinine 0.61 - 1.24 mg/dL 0.76  Sodium 135 - 145 mmol/L 134(L)  Potassium 3.5 - 5.1 mmol/L 3.4(L)  Chloride 98 - 111 mmol/L 103  CO2 22 - 32 mmol/L 23  Calcium 8.9 - 10.3 mg/dL 8.7(L)  Total Protein 6.5 - 8.1 g/dL -  Total Bilirubin 0.3 - 1.2 mg/dL -  Alkaline Phos 38 - 126 U/L -  AST 15 - 41 U/L -  ALT 0 - 44 U/L -   CBC Latest Ref Rng & Units 07/15/2020  WBC 4.0 - 10.5 K/uL 1.3(LL)  Hemoglobin 13.0 - 17.0 g/dL 10.0(L)  Hematocrit 39.0 - 52.0 % 28.5(L)  Platelets 150 - 400 K/uL 76(L)    '@IMAGES' @  DG Chest 2 View  Result Date:  07/12/2020 CLINICAL DATA:  60 year old male with weakness. Currently undergoing treatment for lymphoma. EXAM: CHEST - 2 VIEW COMPARISON:  06/24/2020 and prior chest radiographs FINDINGS: The cardiomediastinal silhouette is unchanged. Slightly prominent SUPERIOR mediastinum again noted. A RIGHT IJ Port-A-Cath is noted with tip overlying the mid SVC. There is no evidence of focal airspace disease, pulmonary edema, suspicious pulmonary nodule/mass, pleural effusion, or pneumothorax. No acute bony abnormalities are identified. IMPRESSION: No acute  cardiopulmonary disease. Electronically Signed   By: Margarette Canada M.D.   On: 07/12/2020 09:57   MR BRAIN W WO CONTRAST  Result Date: 07/15/2020 CLINICAL DATA:  Lymphoma, staging EXAM: MRI HEAD WITHOUT AND WITH CONTRAST TECHNIQUE: Multiplanar, multiecho pulse sequences of the brain and surrounding structures were obtained without and with intravenous contrast. CONTRAST:  7m GADAVIST GADOBUTROL 1 MMOL/ML IV SOLN COMPARISON:  None. FINDINGS: Brain: There is no acute infarction or intracranial hemorrhage. There is no intracranial mass, mass effect, or edema. There is no hydrocephalus or extra-axial fluid collection. Ventricles and sulci are normal in size and configuration. Small focus of right subinsular T2 hyperintensity likely reflects nonspecific gliosis/demyelination. No abnormal enhancement. Vascular: Major vessel flow voids at the skull base are preserved. Skull and upper cervical spine: Normal marrow signal is preserved. Sinuses/Orbits: Minor mucosal thickening.  Orbits are unremarkable. Other: Sella is unremarkable.  Mastoid air cells are clear. IMPRESSION: No evidence of metastatic disease. Electronically Signed   By: PMacy MisM.D.   On: 07/15/2020 12:04   NM Cardiac Muga Rest  Result Date: 07/01/2020 CLINICAL DATA:  High-grade B-cell lymphoma, pre cardiotoxic chemotherapy EXAM: NUCLEAR MEDICINE CARDIAC BLOOD POOL IMAGING (MUGA) TECHNIQUE: Cardiac  multi-gated acquisition was performed at rest following intravenous injection of Tc-924mabeled red blood cells. RADIOPHARMACEUTICALS:  21.059 mCi [id number] in-vitro labeled red blood cells IV COMPARISON:  None FINDINGS: Calculated LEFT ventricular ejection fraction is 54.4%, normal. Study was obtained at a cardiac rate of 56 bpm. Patient was rhythmic during imaging. Cine analysis of the LEFT ventricle in 3 projections demonstrates normal LV wall motion. IMPRESSION: Normal LEFT ventricular ejection fraction of 54.4%. Normal LEFT ventricular wall motion. Electronically Signed   By: MarLavonia DanaD.   On: 07/01/2020 15:16   CT ABDOMEN PELVIS W CONTRAST  Result Date: 07/12/2020 CLINICAL DATA:  Abdominal pain and fever.  Nausea and diarrhea. EXAM: CT ABDOMEN AND PELVIS WITH CONTRAST TECHNIQUE: Multidetector CT imaging of the abdomen and pelvis was performed using the standard protocol following bolus administration of intravenous contrast. CONTRAST:  100m30mNIPAQUE IOHEXOL 300 MG/ML  SOLN COMPARISON:  PET CT dated 05/26/2020. FINDINGS: Lower chest: No acute abnormality. Hepatobiliary: No focal liver abnormality is seen. Gallbladder is unremarkable. No bile duct dilatation. Pancreas: Unremarkable. No pancreatic ductal dilatation or surrounding inflammatory changes. Spleen: Multiple hypodense masses within the spleen, largest measuring 2.8 cm, FDG avid on recent PET-CT indicating lymphoproliferative disorder and presumably related to the recent diagnosis of lymphoma. Adrenals/Urinary Tract: Adrenal glands appear normal. Kidneys are unremarkable without suspicious mass, stone or hydronephrosis. No perinephric fluid. No ureteral or bladder calculi are identified. Bladder is unremarkable, partially decompressed. Stomach/Bowel: Thickening of the walls of the majority of the colon. Additional thickening and enhancement of the walls of multiple segments of small bowel, most prominent within the jejunum and  ileum. Fluid is seen throughout the small bowel, with associated air-fluid levels. Stomach is unremarkable.  Appendix is normal in caliber. Vascular/Lymphatic: Aortic atherosclerosis. No acute appearing vascular abnormality. The intra-abdominal intrapelvic lymphadenopathy demonstrated on recent PET-CT has significantly improved in the interval. Representative lymphadenopathy at the RIGHT pelvic sidewall previously measured 3.5 cm greatest thickness, now measuring 1.6 cm greatest thickness. Lymph node within the LEFT periaortic retroperitoneum previously measured 1.3 cm short axis dimension, now measuring 0.7 cm short axis. Reproductive: Prostate is unremarkable. Other: No free fluid or abscess collection is seen. No free intraperitoneal air. Musculoskeletal: No acute appearing osseous abnormality. IMPRESSION: 1. Significant thickening and enhancement of the  walls of the majority of the colon and multiple segments of small bowel, most prominent within the jejunum and ileum. Differential includes diffuse enterocolitis of infectious or inflammatory nature, typhlitis, radiation induced enterocolitis (if corresponding history of radiation) and graft-versus-host disease (if any corresponding history of bone marrow transplant). 2. The intra-abdominal intrapelvic lymphadenopathy demonstrated on recent PET-CT has significantly improved in the interval indicating a good response to interval treatment. Measurements provided above. 3. Multiple hypodense masses within the spleen, largest measuring 2.8 cm, FDG avid on recent PET-CT indicating lymphoproliferative disorder and also presumably related to the recent diagnosis of lymphoma. Aortic Atherosclerosis (ICD10-I70.0). Electronically Signed   By: Franki Cabot M.D.   On: 07/12/2020 12:32   DG CHEST PORT 1 VIEW  Result Date: 06/24/2020 CLINICAL DATA:  Status post port placement EXAM: PORTABLE CHEST 1 VIEW COMPARISON:  10/21/2019 FINDINGS: Right-sided chest wall port is  noted. Catheter tip is seen in the mid superior vena cava in satisfactory position. No pneumothorax is noted. Persistent fullness in the right paratracheal space is noted consistent with the patient's known history. Cardiac shadow is within normal limits. Mild bibasilar atelectasis is noted related to a poor inspiratory effort. No bony abnormality is seen. IMPRESSION: No pneumothorax following port placement. Electronically Signed   By: Inez Catalina M.D.   On: 06/24/2020 11:28   CT BONE MARROW BIOPSY & ASPIRATION  Result Date: 06/25/2020 INDICATION: New diagnosis of lymphoma. Please perform CT-guided bone marrow biopsy for tissue diagnostic purposes. EXAM: CT-GUIDED BONE MARROW BIOPSY AND ASPIRATION MEDICATIONS: None ANESTHESIA/SEDATION: Fentanyl 50 mcg IV; Versed 1 mg IV Sedation Time: 10 Minutes; The patient was continuously monitored during the procedure by the interventional radiology nurse under my direct supervision. COMPLICATIONS: None immediate. PROCEDURE: Informed consent was obtained from the patient following an explanation of the procedure, risks, benefits and alternatives. The patient understands, agrees and consents for the procedure. All questions were addressed. A time out was performed prior to the initiation of the procedure. The patient was positioned prone and non-contrast localization CT was performed of the pelvis to demonstrate the iliac marrow spaces. The operative site was prepped and draped in the usual sterile fashion. Under sterile conditions and local anesthesia, a 22 gauge spinal needle was utilized for procedural planning. Next, an 11 gauge coaxial bone biopsy needle was advanced into the left iliac marrow space. Needle position was confirmed with CT imaging. Initially, a bone marrow aspiration was performed. Next, a bone marrow biopsy was obtained with the 11 gauge outer bone marrow device. Samples were prepared with the cytotechnologist and deemed adequate. The needle was removed  and superficial hemostasis was obtained with manual compression. A dressing was applied. The patient tolerated the procedure well without immediate post procedural complication. IMPRESSION: Successful CT guided left iliac bone marrow aspiration and core biopsy. Electronically Signed   By: Sandi Mariscal M.D.   On: 06/25/2020 10:30   DG C-Arm 1-60 Min-No Report  Result Date: 06/24/2020 Fluoroscopy was utilized by the requesting physician.  No radiographic interpretation.   DG FLUORO GUIDED LOC OF NEEDLE/CATH TIP FOR SPINAL INJECT LT  Result Date: 07/08/2020 CLINICAL DATA:  Non-Hodgkin's lymphoma EXAM: DIAGNOSTIC LUMBAR PUNCTURE UNDER FLUOROSCOPIC GUIDANCE COMPARISON:  None FLUOROSCOPY TIME:  Fluoroscopy Time:  30 seconds Radiation Exposure Index (if provided by the fluoroscopic device): 4.9 mGy Number of Acquired Spot Images: 0 PROCEDURE: Informed consent was obtained from the patient prior to the procedure, including potential complications of headache, allergy, and pain. With the patient prone, the  lower back was prepped with Betadine. 1% Lidocaine was used for local anesthesia. Lumbar puncture was performed at the L4-5 level using a 22 gauge needle with return of clear 5 ml of CSF were obtained for laboratory studies. Subsequently, 12 mg of methotrexate in 5 mL was hand injected into the thecal sac. The patient tolerated the procedure well and there were no apparent complications. IMPRESSION: Successful fluoroscopic guided lumbar puncture for methotrexate injection. Electronically Signed   By: Kathreen Devoid   On: 07/08/2020 13:54     Assessment and plan- Patient is a 60 y.o. 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  Clinically patient is stable with no significant abdominal pain or diarrhea.  He is presently on  cefepime and Flagyl as per ID can be transition to oral fluoroquinolone once ANC is greater than 1 which I think he will be at that point tomorrow.  I will follow-up with him closely as an outpatient to monitor his counts and ongoing symptoms.  I had also obtain an MRI brain to complete his staging work-up that did not show any evidence of active lymphoma.   Visit Diagnosis 1. Diarrhea, unspecified type   2. Sepsis, due to unspecified organism, unspecified whether acute organ dysfunction present ([place])   3. Neutropenic fever ([place])      Dr. Randa Evens, MD, MPH [facility] at [facility] [phone] 07/15/2020 3:42 PM

## 2020-07-15 NOTE — Progress Notes (Signed)
Daughter given update.

## 2020-07-16 ENCOUNTER — Ambulatory Visit: Payer: Self-pay | Admitting: Gastroenterology

## 2020-07-16 LAB — CBC WITH DIFFERENTIAL/PLATELET
Abs Immature Granulocytes: 0.31 10*3/uL — ABNORMAL HIGH (ref 0.00–0.07)
Basophils Absolute: 0 10*3/uL (ref 0.0–0.1)
Basophils Relative: 1 %
Eosinophils Absolute: 0 10*3/uL (ref 0.0–0.5)
Eosinophils Relative: 1 %
HCT: 29.3 % — ABNORMAL LOW (ref 39.0–52.0)
Hemoglobin: 10.2 g/dL — ABNORMAL LOW (ref 13.0–17.0)
Immature Granulocytes: 12 %
Lymphocytes Relative: 29 %
Lymphs Abs: 0.8 10*3/uL (ref 0.7–4.0)
MCH: 29.5 pg (ref 26.0–34.0)
MCHC: 34.8 g/dL (ref 30.0–36.0)
MCV: 84.7 fL (ref 80.0–100.0)
Monocytes Absolute: 0.5 10*3/uL (ref 0.1–1.0)
Monocytes Relative: 19 %
Neutro Abs: 1 10*3/uL — ABNORMAL LOW (ref 1.7–7.7)
Neutrophils Relative %: 38 %
Platelets: 146 10*3/uL — ABNORMAL LOW (ref 150–400)
RBC: 3.46 MIL/uL — ABNORMAL LOW (ref 4.22–5.81)
RDW: 13.2 % (ref 11.5–15.5)
Smear Review: NORMAL
WBC: 2.7 10*3/uL — ABNORMAL LOW (ref 4.0–10.5)
nRBC: 0 % (ref 0.0–0.2)

## 2020-07-16 LAB — BASIC METABOLIC PANEL
Anion gap: 5 (ref 5–15)
BUN: 8 mg/dL (ref 6–20)
CO2: 26 mmol/L (ref 22–32)
Calcium: 8.9 mg/dL (ref 8.9–10.3)
Chloride: 105 mmol/L (ref 98–111)
Creatinine, Ser: 0.74 mg/dL (ref 0.61–1.24)
GFR, Estimated: >60 mL/min (ref >60)
Glucose, Bld: 100 mg/dL — ABNORMAL HIGH (ref 70–99)
Potassium: 3.4 mmol/L — ABNORMAL LOW (ref 3.5–5.1)
Sodium: 136 mmol/L (ref 135–145)

## 2020-07-16 LAB — MAGNESIUM: Magnesium: 1.9 mg/dL (ref 1.7–2.4)

## 2020-07-16 MED ORDER — CIPROFLOXACIN HCL 500 MG PO TABS: 500.0000 mg | ORAL_TABLET | Freq: Two times a day (BID) | ORAL | 0 refills | Status: AC

## 2020-07-16 MED ORDER — METRONIDAZOLE 500 MG PO TABS
500.0000 mg | ORAL_TABLET | Freq: Three times a day (TID) | ORAL | Status: DC
  Administered 2020-07-16: 12:00:00 500 mg via ORAL
  Filled 2020-07-16: qty 1

## 2020-07-16 MED ORDER — HEPARIN SOD (PORK) LOCK FLUSH 100 UNIT/ML IV SOLN
500.0000 [IU] | Freq: Once | INTRAVENOUS | Status: AC
  Administered 2020-07-16: 14:00:00 500 [IU] via INTRAVENOUS
  Filled 2020-07-16: qty 5

## 2020-07-16 NOTE — Progress Notes (Signed)
Pt being discharged home, discharge instructions reviewed with pt, states understanding, pt with no complaints, awaiting ride for transport 

## 2020-07-16 NOTE — Discharge Summary (Signed)
Physician Discharge Summary  Christopher Mills NGE:[id number] DOB: 1960/05/04 DOA: 07/12/2020  PCP: [name], MD  Admit date: 07/12/2020 Discharge date: 07/16/2020  Admitted From: home Disposition:  home  Recommendations for Outpatient Follow-up:  1. Follow up with PCP and Oncology 2. Please obtain BMP/CBC in one week 3. Please follow up on the following pending results:  Home Health:no  Equipment/Devices: none  Discharge Condition: Stable Code Status:   Code Status: Full Code Diet recommendation:  Diet Order            Diet - low sodium heart healthy           Diet regular Room service appropriate? Yes; Fluid consistency: Thin  Diet effective now                  Brief/Interim Summary: Brief Narrative: 60 year old male with history of high-grade B-cell lymphoma undergoing chemotherapy, hypertension admitted on 4/30 with neutropenic fever after presenting to ED for generalized weakness and diarrhea. Patient is being followed by oncology, infectious disease. Managed with broad-spectrum antibiotics as per ID. Work-up showed Salmonella gastroenteritis, neutropenic fever severe sepsis hypovolemic hyponatremia AKI. At this time patient has improved no diarrhea nausea vomiting tolerating diet.  Neutrophil count has improved.  Discussed with ID and okay for discharge home on oral ciprofloxacin to complete 14 days course.I discussed Dr. Delora Fuel  Discharge Diagnoses:  Severe sepsis POA Neutropenic fever Salmonella gastroenteritis Severe sepsis POA due to GI infection and neutropenia and is resolved.  GI pathogen positive for Salmonella.  Sepsis in the setting of his recent chemo most recent 3 weeks prior to admission.  ID on board s/p cefepime, Flagyl.  WBC count improving today- ANC at 1 today.plan for at least 14 days given his immunocompromise status- 11 more days left okay to go home on cipro Recent Labs  Lab 07/12/20 0911 07/12/20 0940 07/12/20 1113 07/13/20 0835  07/14/20 0458 07/15/20 0523 07/16/20 0505  WBC 0.8*  --   --  0.7* 0.7* 1.3* 2.7*  LATICACIDVEN  --  1.5 1.2  --   --   --   --    Acute hypovolemic hyponatremia: Resolved Acute kidney injury due to sepsis and hypovolemia dehydration.  Resolved with IV fluids.  Lisinopril on hold.resume on d/c. Recent Labs  Lab 07/12/20 0911 07/13/20 0835 07/14/20 0458 07/15/20 0523 07/16/20 0505  BUN 26* _0 CREATININE 1.35* 0.97 0.82 0.76 0.74   Recent Labs  Lab 07/12/20 0911 07/13/20 0835 07/14/20 0458 07/15/20 0523 07/16/20 0505  NA 128* 133* 131* 134* 136   Pancytopenia with neutropenia/thrombocytopenia and anemia: Platelet count improved to 140 6K.  Hemoglobin is stable 10.2 g.  Case is not. 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  Counts improving.  Generalized weakness: improved. Ambulatory.  Hypertension: bo stable.  Protein-calorie malnutrition, severe: See below, augment diet as tolerated  GOC- seen by Palliative care encounter, close follow-up needed.  Diet Order            Diet regular Room service appropriate? Yes; Fluid consistency: Thin  Diet effective now                 Nutrition Problem: Severe Malnutrition (in the context  of acute illness/injury) Etiology: cancer and cancer related treatments Signs/Symptoms: percent weight loss,moderate fat depletion,moderate muscle depletion (5.4% x 1 month) Percent weight loss: 5.4 % Interventions: Ensure Enlive (each supplement provides 350kcal and 20 grams of protein),Snacks Patient's Body mass index is 23.81 kg/m. Consults:  ID, oncology  Subjective: Alert awake oriented resting comfortably.  Would like to go home today. Discharge Exam: Vitals:   07/16/20 0832  07/16/20 1147  BP: (!) 120/59 117/71  Pulse: 67 66  Resp: 18 17  Temp: 98.3 F (36.8 C) 98 F (36.7 C)  SpO2: 100% 100%   General: Pt is alert, awake, not in acute distress Cardiovascular: RRR, S1/S2 +, no rubs, no gallops Respiratory: CTA bilaterally, no wheezing, no rhonchi Abdominal: Soft, NT, ND, bowel sounds + Extremities: no edema, no cyanosis  Discharge Instructions  Discharge Instructions    Diet - low sodium heart healthy   Complete by: As directed    Discharge instructions   Complete by: As directed    Please call call MD or return to ER for similar or worsening recurring problem that brought you to hospital or if any fever,nausea/vomiting,abdominal pain, uncontrolled pain, chest pain,  shortness of breath or any other alarming symptoms.  Please follow-up your doctor as instructed in a week time and call the office for appointment.  Please avoid alcohol, smoking, or any other illicit substance and maintain healthy habits including taking your regular medications as prescribed.  You were cared for by a hospitalist during your hospital stay. If you have any questions about your discharge medications or the care you received while you were in the hospital after you are discharged, you can call the unit and ask to speak with the hospitalist on call if the hospitalist that took care of you is not available.  Once you are discharged, your primary care physician will handle any further medical issues. Please note that NO REFILLS for any discharge medications will be authorized once you are discharged, as it is imperative that you return to your primary care physician (or establish a relationship with a primary care physician if you do not have one) for your aftercare needs so that they can reassess your need for medications and monitor your lab values   Increase activity slowly   Complete by: As directed    No wound care   Complete by: As directed      Allergies as of 07/16/2020    No Known Allergies     Medication List    TAKE these medications   aspirin EC 81 MG tablet Take 81 mg by mouth daily as needed (heart health). Swallow whole.   atenolol 25 MG tablet Commonly known as: TENORMIN Take 25 mg by mouth daily.   ciprofloxacin 500 MG tablet Commonly known as: Cipro Take 1 tablet (500 mg total) by mouth 2 (two) times daily for 19 doses.   HYDROcodone-acetaminophen 5-325 MG tablet Commonly known as: NORCO/VICODIN Take 1 tablet by mouth every 6 (six) hours as needed for moderate pain.   lisinopril 10 MG tablet Commonly known as: ZESTRIL Take 10 mg by mouth daily.   predniSONE 50 MG tablet Commonly known as: DELTASONE Take 2 tablets each day with food early in am for 5 days while on chemo treatment   Tenofovir Alafenamide Fumarate 25 MG Tabs Take 1 tablet (25 mg total) by mouth daily.   VITAMIN D PO Take 1 capsule by mouth daily.       No Known Allergies  The results of significant diagnostics from this hospitalization (including imaging, microbiology, ancillary and laboratory) are listed below for reference.    Microbiology: Recent Results (from the past 240 hour(s))  Culture, blood (Routine x 2)     Status: None (Preliminary result)   Collection Time: 07/12/20  9:40 AM   Specimen: BLOOD  Result Value Ref Range Status   Specimen Description BLOOD LEFT ANTECUBITAL  Final   Special Requests   Final    BOTTLES DRAWN AEROBIC AND ANAEROBIC Blood Culture results may not be optimal due to an inadequate volume of blood received in culture bottles   Culture   Final    NO GROWTH 4 DAYS Performed at [facility], [address], [place], [place] [address]    Report Status PENDING  Incomplete  Culture, blood (Routine x 2)     Status: None (Preliminary result)   Collection Time: 07/12/20  9:40 AM   Specimen: BLOOD  Result Value Ref Range Status   Specimen Description BLOOD PART  Final   Special Requests   Final    BOTTLES DRAWN  AEROBIC AND ANAEROBIC Blood Culture adequate volume   Culture   Final    NO GROWTH 4 DAYS Performed at [facility], [address], [place], [place] [address]    Report Status PENDING  Incomplete  Resp Panel by RT-PCR (Flu A&B, Covid) Nasopharyngeal Swab     Status: None   Collection Time: 07/12/20 10:02 AM   Specimen: Nasopharyngeal Swab; Nasopharyngeal(NP) swabs in vial transport medium  Result Value Ref Range Status   SARS Coronavirus 2 by RT PCR NEGATIVE NEGATIVE Final    Comment: (NOTE) SARS-CoV-2 target nucleic acids are NOT DETECTED.  The SARS-CoV-2 RNA is generally detectable in upper respiratory specimens during the acute phase of infection. The lowest concentration of SARS-CoV-2 viral copies this assay can detect is 138 copies/mL. A negative result does not preclude SARS-Cov-2 infection and should not be used as the sole basis for treatment or other patient management decisions. A negative result may occur with  improper specimen collection/handling, submission of specimen other than nasopharyngeal swab, presence of viral mutation(s) within the areas targeted by this assay, and inadequate number of viral copies(<138 copies/mL). A negative result must be combined with clinical observations, patient history, and epidemiological information. The expected result is Negative.  Fact Sheet for Patients:  EntrepreneurPulse.com.au  Fact Sheet for Healthcare Providers:  IncredibleEmployment.be  This test is no t yet approved or cleared by the [place] FDA and  has been authorized for detection and/or diagnosis of SARS-CoV-2 by FDA under an Emergency Use Authorization (EUA). This EUA will remain  in effect (meaning this test can be used) for the duration of the COVID-19 declaration under Section 564(b)(1) of the Act, 21 U.S.C.section 360bbb-3(b)(1), unless the authorization is terminated  or revoked sooner.       Influenza A  by PCR NEGATIVE NEGATIVE Final   Influenza B by PCR NEGATIVE NEGATIVE Final    Comment: (NOTE) The Xpert Xpress SARS-CoV-2/FLU/RSV plus assay is intended as an aid in the diagnosis of influenza from Nasopharyngeal swab specimens and should not be used as a sole basis for treatment. Nasal washings and aspirates are unacceptable for Xpert Xpress SARS-CoV-2/FLU/RSV testing.  Fact Sheet for Patients: EntrepreneurPulse.com.au  Fact Sheet for Healthcare Providers: IncredibleEmployment.be  This test is not yet approved or cleared by the [place] FDA and has been authorized for detection and/or diagnosis of SARS-CoV-2 by FDA under an  Emergency Use Authorization (EUA). This EUA will remain in effect (meaning this test can be used) for the duration of the COVID-19 declaration under Section 564(b)(1) of the Act, 21 U.S.C. section 360bbb-3(b)(1), unless the authorization is terminated or revoked.  Performed at [facility], [address] [place], [place] [address]   Gastrointestinal Panel by PCR , Stool     Status: Abnormal   Collection Time: 07/13/20  2:12 AM   Specimen: Stool  Result Value Ref Range Status   Campylobacter species NOT DETECTED NOT DETECTED Final   Plesimonas shigelloides NOT DETECTED NOT DETECTED Final   Salmonella species DETECTED (A) NOT DETECTED Final    Comment: RESULT CALLED TO, READ BACK BY AND VERIFIED WITH: ALEXIS STOGDEN ON 07/13/20 AT 0353 QSD    Yersinia enterocolitica NOT DETECTED NOT DETECTED Final   Vibrio species NOT DETECTED NOT DETECTED Final   Vibrio cholerae NOT DETECTED NOT DETECTED Final   Enteroaggregative E coli (EAEC) NOT DETECTED NOT DETECTED Final   Enteropathogenic E coli (EPEC) NOT DETECTED NOT DETECTED Final   Enterotoxigenic E coli (ETEC) NOT DETECTED NOT DETECTED Final   Shiga like toxin producing E coli (STEC) NOT DETECTED NOT DETECTED Final   Shigella/Enteroinvasive E coli (EIEC) NOT  DETECTED NOT DETECTED Final   Cryptosporidium NOT DETECTED NOT DETECTED Final   Cyclospora cayetanensis NOT DETECTED NOT DETECTED Final   Entamoeba histolytica NOT DETECTED NOT DETECTED Final   Giardia lamblia NOT DETECTED NOT DETECTED Final   Adenovirus F40/41 NOT DETECTED NOT DETECTED Final   Astrovirus NOT DETECTED NOT DETECTED Final   Norovirus GI/GII NOT DETECTED NOT DETECTED Final   Rotavirus A NOT DETECTED NOT DETECTED Final   Sapovirus (I, II, IV, and V) NOT DETECTED NOT DETECTED Final    Comment: Performed at [facility], [address] [place], [place] [address]  C Difficile Quick Screen w PCR reflex     Status: None   Collection Time: 07/13/20  2:12 AM   Specimen: STOOL  Result Value Ref Range Status   C Diff antigen NEGATIVE NEGATIVE Final   C Diff toxin NEGATIVE NEGATIVE Final   C Diff interpretation No C. difficile detected.  Final    Comment: Performed at [facility], [address] [place], [place] [address]    Procedures/Studies: DG Chest 2 View  Result Date: 07/12/2020 CLINICAL DATA:  60 year old male with weakness. Currently undergoing treatment for lymphoma. EXAM: CHEST - 2 VIEW COMPARISON:  06/24/2020 and prior chest radiographs FINDINGS: The cardiomediastinal silhouette is unchanged. Slightly prominent SUPERIOR mediastinum again noted. A RIGHT IJ Port-A-Cath is noted with tip overlying the mid SVC. There is no evidence of focal airspace disease, pulmonary edema, suspicious pulmonary nodule/mass, pleural effusion, or pneumothorax. No acute bony abnormalities are identified. IMPRESSION: No acute cardiopulmonary disease. Electronically Signed   By: Margarette Canada M.D.   On: 07/12/2020 09:57   MR BRAIN W WO CONTRAST  Result Date: 07/15/2020 CLINICAL DATA:  Lymphoma, staging EXAM: MRI HEAD WITHOUT AND WITH CONTRAST TECHNIQUE: Multiplanar, multiecho pulse sequences of the brain and surrounding structures were obtained without and with intravenous  contrast. CONTRAST:  66m GADAVIST GADOBUTROL 1 MMOL/ML IV SOLN COMPARISON:  None. FINDINGS: Brain: There is no acute infarction or intracranial hemorrhage. There is no intracranial mass, mass effect, or edema. There is no hydrocephalus or extra-axial fluid collection. Ventricles and sulci are normal in size and configuration. Small focus of right subinsular T2 hyperintensity likely reflects nonspecific gliosis/demyelination. No abnormal enhancement. Vascular:  Major vessel flow voids at the skull base are preserved. Skull and upper cervical spine: Normal marrow signal is preserved. Sinuses/Orbits: Minor mucosal thickening.  Orbits are unremarkable. Other: Sella is unremarkable.  Mastoid air cells are clear. IMPRESSION: No evidence of metastatic disease. Electronically Signed   By: Macy Mis M.D.   On: 07/15/2020 12:04   NM Cardiac Muga Rest  Result Date: 07/01/2020 CLINICAL DATA:  High-grade B-cell lymphoma, pre cardiotoxic chemotherapy EXAM: NUCLEAR MEDICINE CARDIAC BLOOD POOL IMAGING (MUGA) TECHNIQUE: Cardiac multi-gated acquisition was performed at rest following intravenous injection of Tc-47mlabeled red blood cells. RADIOPHARMACEUTICALS:  21.059 mCi Tc-931mertechnetate in-vitro labeled red blood cells IV COMPARISON:  None FINDINGS: Calculated LEFT ventricular ejection fraction is 54.4%, normal. Study was obtained at a cardiac rate of 56 bpm. Patient was rhythmic during imaging. Cine analysis of the LEFT ventricle in 3 projections demonstrates normal LV wall motion. IMPRESSION: Normal LEFT ventricular ejection fraction of 54.4%. Normal LEFT ventricular wall motion. Electronically Signed   By: MaLavonia Dana.D.   On: 07/01/2020 15:16   CT ABDOMEN PELVIS W CONTRAST  Result Date: 07/12/2020 CLINICAL DATA:  Abdominal pain and fever.  Nausea and diarrhea. EXAM: CT ABDOMEN AND PELVIS WITH CONTRAST TECHNIQUE: Multidetector CT imaging of the abdomen and pelvis was performed using the standard protocol  following bolus administration of intravenous contrast. CONTRAST:  10052mMNIPAQUE IOHEXOL 300 MG/ML  SOLN COMPARISON:  PET CT dated 05/26/2020. FINDINGS: Lower chest: No acute abnormality. Hepatobiliary: No focal liver abnormality is seen. Gallbladder is unremarkable. No bile duct dilatation. Pancreas: Unremarkable. No pancreatic ductal dilatation or surrounding inflammatory changes. Spleen: Multiple hypodense masses within the spleen, largest measuring 2.8 cm, FDG avid on recent PET-CT indicating lymphoproliferative disorder and presumably related to the recent diagnosis of lymphoma. Adrenals/Urinary Tract: Adrenal glands appear normal. Kidneys are unremarkable without suspicious mass, stone or hydronephrosis. No perinephric fluid. No ureteral or bladder calculi are identified. Bladder is unremarkable, partially decompressed. Stomach/Bowel: Thickening of the walls of the majority of the colon. Additional thickening and enhancement of the walls of multiple segments of small bowel, most prominent within the jejunum and ileum. Fluid is seen throughout the small bowel, with associated air-fluid levels. Stomach is unremarkable.  Appendix is normal in caliber. Vascular/Lymphatic: Aortic atherosclerosis. No acute appearing vascular abnormality. The intra-abdominal intrapelvic lymphadenopathy demonstrated on recent PET-CT has significantly improved in the interval. Representative lymphadenopathy at the RIGHT pelvic sidewall previously measured 3.5 cm greatest thickness, now measuring 1.6 cm greatest thickness. Lymph node within the LEFT periaortic retroperitoneum previously measured 1.3 cm short axis dimension, now measuring 0.7 cm short axis. Reproductive: Prostate is unremarkable. Other: No free fluid or abscess collection is seen. No free intraperitoneal air. Musculoskeletal: No acute appearing osseous abnormality. IMPRESSION: 1. Significant thickening and enhancement of the walls of the majority of the colon and  multiple segments of small bowel, most prominent within the jejunum and ileum. Differential includes diffuse enterocolitis of infectious or inflammatory nature, typhlitis, radiation induced enterocolitis (if corresponding history of radiation) and graft-versus-host disease (if any corresponding history of bone marrow transplant). 2. The intra-abdominal intrapelvic lymphadenopathy demonstrated on recent PET-CT has significantly improved in the interval indicating a good response to interval treatment. Measurements provided above. 3. Multiple hypodense masses within the spleen, largest measuring 2.8 cm, FDG avid on recent PET-CT indicating lymphoproliferative disorder and also presumably related to the recent diagnosis of lymphoma. Aortic Atherosclerosis (ICD10-I70.0). Electronically Signed   By: StaFranki CabotD.   On: 07/12/2020 12:32  DG CHEST PORT 1 VIEW  Result Date: 06/24/2020 CLINICAL DATA:  Status post port placement EXAM: PORTABLE CHEST 1 VIEW COMPARISON:  10/21/2019 FINDINGS: Right-sided chest wall port is noted. Catheter tip is seen in the mid superior vena cava in satisfactory position. No pneumothorax is noted. Persistent fullness in the right paratracheal space is noted consistent with the patient's known history. Cardiac shadow is within normal limits. Mild bibasilar atelectasis is noted related to a poor inspiratory effort. No bony abnormality is seen. IMPRESSION: No pneumothorax following port placement. Electronically Signed   By: Inez Catalina M.D.   On: 06/24/2020 11:28   CT BONE MARROW BIOPSY & ASPIRATION  Result Date: 06/25/2020 INDICATION: New diagnosis of lymphoma. Please perform CT-guided bone marrow biopsy for tissue diagnostic purposes. EXAM: CT-GUIDED BONE MARROW BIOPSY AND ASPIRATION MEDICATIONS: None ANESTHESIA/SEDATION: Fentanyl 50 mcg IV; Versed 1 mg IV Sedation Time: 10 Minutes; The patient was continuously monitored during the procedure by the interventional radiology nurse  under my direct supervision. COMPLICATIONS: None immediate. PROCEDURE: Informed consent was obtained from the patient following an explanation of the procedure, risks, benefits and alternatives. The patient understands, agrees and consents for the procedure. All questions were addressed. A time out was performed prior to the initiation of the procedure. The patient was positioned prone and non-contrast localization CT was performed of the pelvis to demonstrate the iliac marrow spaces. The operative site was prepped and draped in the usual sterile fashion. Under sterile conditions and local anesthesia, a 22 gauge spinal needle was utilized for procedural planning. Next, an 11 gauge coaxial bone biopsy needle was advanced into the left iliac marrow space. Needle position was confirmed with CT imaging. Initially, a bone marrow aspiration was performed. Next, a bone marrow biopsy was obtained with the 11 gauge outer bone marrow device. Samples were prepared with the cytotechnologist and deemed adequate. The needle was removed and superficial hemostasis was obtained with manual compression. A dressing was applied. The patient tolerated the procedure well without immediate post procedural complication. IMPRESSION: Successful CT guided left iliac bone marrow aspiration and core biopsy. Electronically Signed   By: Sandi Mariscal M.D.   On: 06/25/2020 10:30   DG C-Arm 1-60 Min-No Report  Result Date: 06/24/2020 Fluoroscopy was utilized by the requesting physician.  No radiographic interpretation.   DG FLUORO GUIDED LOC OF NEEDLE/CATH TIP FOR SPINAL INJECT LT  Result Date: 07/08/2020 CLINICAL DATA:  Non-Hodgkin's lymphoma EXAM: DIAGNOSTIC LUMBAR PUNCTURE UNDER FLUOROSCOPIC GUIDANCE COMPARISON:  None FLUOROSCOPY TIME:  Fluoroscopy Time:  30 seconds Radiation Exposure Index (if provided by the fluoroscopic device): 4.9 mGy Number of Acquired Spot Images: 0 PROCEDURE: Informed consent was obtained from the patient prior to  the procedure, including potential complications of headache, allergy, and pain. With the patient prone, the lower back was prepped with Betadine. 1% Lidocaine was used for local anesthesia. Lumbar puncture was performed at the L4-5 level using a 22 gauge needle with return of clear 5 ml of CSF were obtained for laboratory studies. Subsequently, 12 mg of methotrexate in 5 mL was hand injected into the thecal sac. The patient tolerated the procedure well and there were no apparent complications. IMPRESSION: Successful fluoroscopic guided lumbar puncture for methotrexate injection. Electronically Signed   By: Kathreen Devoid   On: 07/08/2020 13:54    Labs: BNP (last 3 results) No results for input(s): BNP in the last 8760 hours. Basic Metabolic Panel: Recent Labs  Lab 07/12/20 0911 07/13/20 3818 07/14/20 0458 07/15/20 0523 07/16/20  0505  NA 128* 133* 131* 134* 136  K 4.1 3.7 3.3* 3.4* 3.4*  CL 96* 101 100 103 105  CO2 21* _0 GLUCOSE 120* 91 101* 97 100*  BUN 26* _1 CREATININE 1.35* 0.97 0.82 0.76 0.74  CALCIUM 9.4 8.8* 8.6* 8.7* 8.9  MG 2.1 2.1 2.0 1.9 1.9  PHOS  --  2.3*  --   --   --    Liver Function Tests: Recent Labs  Lab 07/10/20 0814 07/12/20 0911 07/13/20 0835  AST 27 14* 21  ALT 37 25 26  ALKPHOS 88 73 60  BILITOT 0.9 1.3* 1.2  PROT 6.9 7.2 6.3*  ALBUMIN 3.9 3.6 2.9*   Recent Labs  Lab 07/12/20 0911  LIPASE 21   No results for input(s): AMMONIA in the last 168 hours. CBC: Recent Labs  Lab 07/12/20 0911 07/13/20 0835 07/14/20 0458 07/15/20 0523 07/16/20 0505  WBC 0.8* 0.7* 0.7* 1.3* 2.7*  NEUTROABS 0.4* 0.3* 0.3* 0.5* 1.0*  HGB 12.5* 10.6* 9.7* 10.0* 10.2*  HCT 35.9* 30.2* 27.4* 28.5* 29.3*  MCV 84.3 84.6 83.5 84.1 84.7  PLT 85* 59* 51* 76* 146*   Cardiac Enzymes: No results for input(s): CKTOTAL, CKMB, CKMBINDEX, TROPONINI in the last 168 hours. BNP: Invalid input(s): POCBNP CBG: No results for input(s): GLUCAP in the last 168  hours. D-Dimer No results for input(s): DDIMER in the last 72 hours. Hgb A1c No results for input(s): HGBA1C in the last 72 hours. Lipid Profile No results for input(s): CHOL, HDL, LDLCALC, TRIG, CHOLHDL, LDLDIRECT in the last 72 hours. Thyroid function studies No results for input(s): TSH, T4TOTAL, T3FREE, THYROIDAB in the last 72 hours.  Invalid input(s): FREET3 Anemia work up No results for input(s): VITAMINB12, FOLATE, FERRITIN, TIBC, IRON, RETICCTPCT in the last 72 hours. Urinalysis    Component Value Date/Time   COLORURINE YELLOW (A) 07/12/2020 0940   APPEARANCEUR CLEAR (A) 07/12/2020 0940   LABSPEC 1.031 (H) 07/12/2020 0940   PHURINE 6.0 07/12/2020 0940   GLUCOSEU NEGATIVE 07/12/2020 0940   HGBUR NEGATIVE 07/12/2020 0940   BILIRUBINUR NEGATIVE 07/12/2020 0940   KETONESUR NEGATIVE 07/12/2020 0940   PROTEINUR NEGATIVE 07/12/2020 0940   NITRITE NEGATIVE 07/12/2020 0940   LEUKOCYTESUR NEGATIVE 07/12/2020 0940   Sepsis Labs Invalid input(s): PROCALCITONIN,  WBC,  LACTICIDVEN Microbiology Recent Results (from the past 240 hour(s))  Culture, blood (Routine x 2)     Status: None (Preliminary result)   Collection Time: 07/12/20  9:40 AM   Specimen: BLOOD  Result Value Ref Range Status   Specimen Description BLOOD LEFT ANTECUBITAL  Final   Special Requests   Final    BOTTLES DRAWN AEROBIC AND ANAEROBIC Blood Culture results may not be optimal due to an inadequate volume of blood received in culture bottles   Culture   Final    NO GROWTH 4 DAYS Performed at [facility], [address], [place], [place] [address]    Report Status PENDING  Incomplete  Culture, blood (Routine x 2)     Status: None (Preliminary result)   Collection Time: 07/12/20  9:40 AM   Specimen: BLOOD  Result Value Ref Range Status   Specimen Description BLOOD PART  Final   Special Requests   Final    BOTTLES DRAWN AEROBIC AND ANAEROBIC Blood Culture adequate volume   Culture   Final    NO  GROWTH 4 DAYS Performed at [facility], [address] [place],  [place] [address]    Report Status PENDING  Incomplete  Resp Panel by RT-PCR (Flu A&B, Covid) Nasopharyngeal Swab     Status: None   Collection Time: 07/12/20 10:02 AM   Specimen: Nasopharyngeal Swab; Nasopharyngeal(NP) swabs in vial transport medium  Result Value Ref Range Status   SARS Coronavirus 2 by RT PCR NEGATIVE NEGATIVE Final    Comment: (NOTE) SARS-CoV-2 target nucleic acids are NOT DETECTED.  The SARS-CoV-2 RNA is generally detectable in upper respiratory specimens during the acute phase of infection. The lowest concentration of SARS-CoV-2 viral copies this assay can detect is 138 copies/mL. A negative result does not preclude SARS-Cov-2 infection and should not be used as the sole basis for treatment or other patient management decisions. A negative result may occur with  improper specimen collection/handling, submission of specimen other than nasopharyngeal swab, presence of viral mutation(s) within the areas targeted by this assay, and inadequate number of viral copies(<138 copies/mL). A negative result must be combined with clinical observations, patient history, and epidemiological information. The expected result is Negative.  Fact Sheet for Patients:  EntrepreneurPulse.com.au  Fact Sheet for Healthcare Providers:  IncredibleEmployment.be  This test is no t yet approved or cleared by the [place] FDA and  has been authorized for detection and/or diagnosis of SARS-CoV-2 by FDA under an Emergency Use Authorization (EUA). This EUA will remain  in effect (meaning this test can be used) for the duration of the COVID-19 declaration under Section 564(b)(1) of the Act, 21 U.S.C.section 360bbb-3(b)(1), unless the authorization is terminated  or revoked sooner.       Influenza A by PCR NEGATIVE NEGATIVE Final   Influenza B by PCR NEGATIVE NEGATIVE Final     Comment: (NOTE) The Xpert Xpress SARS-CoV-2/FLU/RSV plus assay is intended as an aid in the diagnosis of influenza from Nasopharyngeal swab specimens and should not be used as a sole basis for treatment. Nasal washings and aspirates are unacceptable for Xpert Xpress SARS-CoV-2/FLU/RSV testing.  Fact Sheet for Patients: EntrepreneurPulse.com.au  Fact Sheet for Healthcare Providers: IncredibleEmployment.be  This test is not yet approved or cleared by the [place] FDA and has been authorized for detection and/or diagnosis of SARS-CoV-2 by FDA under an Emergency Use Authorization (EUA). This EUA will remain in effect (meaning this test can be used) for the duration of the COVID-19 declaration under Section 564(b)(1) of the Act, 21 U.S.C. section 360bbb-3(b)(1), unless the authorization is terminated or revoked.  Performed at [facility], [address] [place], [place] [address]   Gastrointestinal Panel by PCR , Stool     Status: Abnormal   Collection Time: 07/13/20  2:12 AM   Specimen: Stool  Result Value Ref Range Status   Campylobacter species NOT DETECTED NOT DETECTED Final   Plesimonas shigelloides NOT DETECTED NOT DETECTED Final   Salmonella species DETECTED (A) NOT DETECTED Final    Comment: RESULT CALLED TO, READ BACK BY AND VERIFIED WITH: ALEXIS STOGDEN ON 07/13/20 AT 0353 QSD    Yersinia enterocolitica NOT DETECTED NOT DETECTED Final   Vibrio species NOT DETECTED NOT DETECTED Final   Vibrio cholerae NOT DETECTED NOT DETECTED Final   Enteroaggregative E coli (EAEC) NOT DETECTED NOT DETECTED Final   Enteropathogenic E coli (EPEC) NOT DETECTED NOT DETECTED Final   Enterotoxigenic E coli (ETEC) NOT DETECTED NOT DETECTED Final   Shiga like toxin producing E coli (STEC) NOT DETECTED NOT DETECTED Final   Shigella/Enteroinvasive E coli (EIEC) NOT DETECTED NOT DETECTED Final  Cryptosporidium NOT DETECTED NOT DETECTED Final    Cyclospora cayetanensis NOT DETECTED NOT DETECTED Final   Entamoeba histolytica NOT DETECTED NOT DETECTED Final   Giardia lamblia NOT DETECTED NOT DETECTED Final   Adenovirus F40/41 NOT DETECTED NOT DETECTED Final   Astrovirus NOT DETECTED NOT DETECTED Final   Norovirus GI/GII NOT DETECTED NOT DETECTED Final   Rotavirus A NOT DETECTED NOT DETECTED Final   Sapovirus (I, II, IV, and V) NOT DETECTED NOT DETECTED Final    Comment: Performed at [facility], [address] [place], [place] [address]  C Difficile Quick Screen w PCR reflex     Status: None   Collection Time: 07/13/20  2:12 AM   Specimen: STOOL  Result Value Ref Range Status   C Diff antigen NEGATIVE NEGATIVE Final   C Diff toxin NEGATIVE NEGATIVE Final   C Diff interpretation No C. difficile detected.  Final    Comment: Performed at [facility], [address] [place], [place] [address]     Time coordinating discharge: 35 minutes  SIGNED: Antonieta Pert, MD  [facility] 07/16/2020, 1:18 PM  If 7PM-7AM, please contact night-coverage [email or web address]

## 2020-07-16 NOTE — Progress Notes (Signed)
Home meds returned to pt.

## 2020-07-16 NOTE — TOC Transition Note (Signed)
Transition of Care [facility]) - CM/SW Discharge Note   Patient Details  Name: Christopher Mills MRN: [id number] Date of Birth: 1960-06-27  Transition of Care [facility] CM/SW Contact:  Shelbie Hutching, RN Phone Number: 07/16/2020, 2:36 PM   Clinical Narrative:    Patient medically cleared for discharge home today.  Prescriptions sent to [facility] on [address].  Patient reports that he can get his prescriptions today but he would appreciate any resources to help with medical costs in the future.  Patient is current with PCP and Oncology.  RNCM provided patient with a Good Rx coupon for the Cipro.  Medication Management application also given to patient before discharge- informed patient that in the future his medications can be sent there and he can get them for free.  Patient verbalizes understanding.  Family is picking him up today.   Final next level of care: Home/Self Care Barriers to Discharge: Barriers Resolved   Patient Goals and CMS Choice Patient states their goals for this hospitalization and ongoing recovery are:: to return home with daughter CMS Medicare.gov Compare Post Acute Care list provided to:: Patient Choice offered to / list presented to : Patient  Discharge Placement                       Discharge Plan and Services                DME Arranged: N/A DME Agency: NA       HH Arranged: NA          Social Determinants of Health (SDOH) Interventions     Readmission Risk Interventions No flowsheet data found.

## 2020-07-17 ENCOUNTER — Inpatient Hospital Stay: Payer: Self-pay

## 2020-07-17 ENCOUNTER — Telehealth: Payer: Self-pay | Admitting: Oncology

## 2020-07-17 LAB — CULTURE, BLOOD (ROUTINE X 2)
Culture: NO GROWTH
Culture: NO GROWTH
Special Requests: ADEQUATE

## 2020-07-17 LAB — CBC WITH DIFFERENTIAL/PLATELET
Abs Immature Granulocytes: 0.05 10*3/uL (ref 0.00–0.07)
Basophils Absolute: 0 10*3/uL (ref 0.0–0.1)
Basophils Relative: 2 %
Eosinophils Absolute: 0 10*3/uL (ref 0.0–0.5)
Eosinophils Relative: 3 %
HCT: 27.4 % — ABNORMAL LOW (ref 39.0–52.0)
Hemoglobin: 9.7 g/dL — ABNORMAL LOW (ref 13.0–17.0)
Immature Granulocytes: 8 %
Lymphocytes Relative: 40 %
Lymphs Abs: 0.3 10*3/uL — ABNORMAL LOW (ref 0.7–4.0)
MCH: 29.6 pg (ref 26.0–34.0)
MCHC: 35.4 g/dL (ref 30.0–36.0)
MCV: 83.5 fL (ref 80.0–100.0)
Monocytes Absolute: 0.1 10*3/uL (ref 0.1–1.0)
Monocytes Relative: 8 %
Neutro Abs: 0.3 10*3/uL — CL (ref 1.7–7.7)
Neutrophils Relative %: 39 %
Platelets: 51 10*3/uL — ABNORMAL LOW (ref 150–400)
RBC: 3.28 MIL/uL — ABNORMAL LOW (ref 4.22–5.81)
RDW: 12.9 % (ref 11.5–15.5)
Smear Review: DECREASED
WBC: 0.7 10*3/uL — CL (ref 4.0–10.5)
nRBC: 0 % (ref 0.0–0.2)

## 2020-07-17 NOTE — Telephone Encounter (Signed)
Left VM with patient to notify him of cancelled labs this afternoon (per MD) and appt scheduled for Monday 5/9 for labs/MD.

## 2020-07-18 LAB — METHYLMALONIC ACID, SERUM: Methylmalonic Acid, Quantitative: 101 nmol/L (ref 0–378)

## 2020-07-20 ENCOUNTER — Other Ambulatory Visit: Payer: Self-pay | Admitting: *Deleted

## 2020-07-20 DIAGNOSIS — C8510 Unspecified B-cell lymphoma, unspecified site: Secondary | ICD-10-CM

## 2020-07-21 ENCOUNTER — Inpatient Hospital Stay (HOSPITAL_BASED_OUTPATIENT_CLINIC_OR_DEPARTMENT_OTHER): Payer: Self-pay | Admitting: Oncology

## 2020-07-21 ENCOUNTER — Inpatient Hospital Stay: Payer: Self-pay

## 2020-07-21 ENCOUNTER — Encounter: Payer: Self-pay | Admitting: Oncology

## 2020-07-21 VITALS — BP 104/75 | HR 84 | Temp 97.7°F | Resp 18 | Wt 184.0 lb

## 2020-07-21 DIAGNOSIS — C8510 Unspecified B-cell lymphoma, unspecified site: Secondary | ICD-10-CM

## 2020-07-21 DIAGNOSIS — K529 Noninfective gastroenteritis and colitis, unspecified: Secondary | ICD-10-CM

## 2020-07-21 DIAGNOSIS — D6481 Anemia due to antineoplastic chemotherapy: Secondary | ICD-10-CM

## 2020-07-21 DIAGNOSIS — T451X5A Adverse effect of antineoplastic and immunosuppressive drugs, initial encounter: Secondary | ICD-10-CM

## 2020-07-21 LAB — CBC WITH DIFFERENTIAL/PLATELET
Abs Immature Granulocytes: 1.16 10*3/uL — ABNORMAL HIGH (ref 0.00–0.07)
Basophils Absolute: 0.1 10*3/uL (ref 0.0–0.1)
Basophils Relative: 1 %
Eosinophils Absolute: 0 10*3/uL (ref 0.0–0.5)
Eosinophils Relative: 0 %
HCT: 31.7 % — ABNORMAL LOW (ref 39.0–52.0)
Hemoglobin: 10.7 g/dL — ABNORMAL LOW (ref 13.0–17.0)
Immature Granulocytes: 11 %
Lymphocytes Relative: 14 %
Lymphs Abs: 1.4 10*3/uL (ref 0.7–4.0)
MCH: 29.4 pg (ref 26.0–34.0)
MCHC: 33.8 g/dL (ref 30.0–36.0)
MCV: 87.1 fL (ref 80.0–100.0)
Monocytes Absolute: 0.8 10*3/uL (ref 0.1–1.0)
Monocytes Relative: 7 %
Neutro Abs: 7.1 10*3/uL (ref 1.7–7.7)
Neutrophils Relative %: 67 %
Platelets: 462 10*3/uL — ABNORMAL HIGH (ref 150–400)
RBC: 3.64 MIL/uL — ABNORMAL LOW (ref 4.22–5.81)
RDW: 15 % (ref 11.5–15.5)
Smear Review: NORMAL
WBC: 10.5 10*3/uL (ref 4.0–10.5)
nRBC: 0.3 % — ABNORMAL HIGH (ref 0.0–0.2)

## 2020-07-21 LAB — BASIC METABOLIC PANEL
Anion gap: 8 (ref 5–15)
BUN: 10 mg/dL (ref 6–20)
CO2: 25 mmol/L (ref 22–32)
Calcium: 9.1 mg/dL (ref 8.9–10.3)
Chloride: 105 mmol/L (ref 98–111)
Creatinine, Ser: 0.97 mg/dL (ref 0.61–1.24)
GFR, Estimated: >60 mL/min (ref >60)
Glucose, Bld: 138 mg/dL — ABNORMAL HIGH (ref 70–99)
Potassium: 3.6 mmol/L (ref 3.5–5.1)
Sodium: 138 mmol/L (ref 135–145)

## 2020-07-21 NOTE — Progress Notes (Signed)
Patient states he feels better since being out of the hospital

## 2020-07-21 NOTE — Progress Notes (Signed)
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 This was followed by a PET CT scan which showed extensive FDG avid adenopathy in the neck chest abdomen and pelvis. FDG avid splenic lesions. Solitary intramuscular FDG avid lesion in the right biceps femoris muscle.  Supraclavicular excisional lymph node biopsy showed high-grade B-cell lymphoma [facility] type Ki-67 greater than 95%. FISH testing was positive for Bcl-2 BCL6 and MYC consistent with triple hit lymphoma. By NCCN IPI score would be 4 based on age and elevated LDH and stage IV(intramuscular biceps femoris lesion) which puts him in the high intermediate risk group. CNS IPI score 4   Bone marrow biopsy showed involvement with low-grade B-cell lymphoproliferative disorder with no evidence ofHigh-grade B-cell lymphoma.  Patient received RCHOP for cycle 1 with plans for DA [facility] with cycle 2. IT MTX for CNS prophylaxis.   MRI brain negative for lymphoma  Interval history-patient reports doing well presently.  He is having regular bowel movements and denies any abdominal pain or diarrhea.  Denies any fevers.  Denies any significant fatigue at this time  ECOG PS- 1 Pain scale- 0   Review of systems- Review of Systems  Constitutional: Negative for  chills, fever, malaise/fatigue and weight loss.  HENT: Negative for congestion, ear discharge and nosebleeds.   Eyes: Negative for blurred vision.  Respiratory: Negative for cough, hemoptysis, sputum production, shortness of breath and wheezing.   Cardiovascular: Negative for chest pain, palpitations, orthopnea and claudication.  Gastrointestinal: Negative for abdominal pain, blood in stool, constipation, diarrhea, heartburn, melena, nausea and vomiting.  Genitourinary: Negative for dysuria, flank pain, frequency, hematuria and urgency.  Musculoskeletal: Negative for back pain, joint pain and myalgias.  Skin: Negative for rash.  Neurological: Negative for dizziness, tingling, focal weakness, seizures, weakness and headaches.  Endo/Heme/Allergies: Does not bruise/bleed easily.  Psychiatric/Behavioral: Negative for depression and suicidal ideas. The patient does not have insomnia.      No Known Allergies   Past Medical History:  Diagnosis Date  . Acute kidney injury ([place]) 07/12/2020  . Dyspnea   . Hypertension   . Lymphoma of lymph nodes of neck ([place]) 06/18/2020     Past Surgical History:  Procedure Laterality Date  . BONE MARROW BIOPSY    . COLONOSCOPY    . EXCISION MASS NECK Right 06/12/2020   Procedure: EXCISION MASS NECK;  Surgeon: Jules Husbands, MD;  Location: [facility] ORS;  Service: General;  Laterality: Right;  . HERNIA REPAIR Right    at age 48-RIH  . PORTA CATH INSERTION    . PORTACATH PLACEMENT Right 06/24/2020   Procedure: INSERTION PORT-A-CATH;  Surgeon: Jules Husbands, MD;  Location: [facility] ORS;  Service: General;  Laterality: Right;    Social History   Socioeconomic History  . Marital status: Single    Spouse name: Not on file  . Number of children: Not on file  .  Years of education: Not on file  . Highest education level: Not on file  Occupational History  . Not on file  Tobacco Use  . Smoking status: Former Smoker    Types: Cigarettes    Quit date: 06/13/2020     Years since quitting: 0.1  . Smokeless tobacco: Never Used  Vaping Use  . Vaping Use: Never used  Substance and Sexual Activity  . Alcohol use: Not Currently  . Drug use: Not Currently    Types: Marijuana  . Sexual activity: Not Currently  Other Topics Concern  . Not on file  Social History Narrative   Has daughter and grandson in the home   Social Determinants of Health   Financial Resource Strain: Not on file  Food Insecurity: Not on file  Transportation Needs: Not on file  Physical Activity: Not on file  Stress: Not on file  Social Connections: Not on file  Intimate Partner Violence: Not on file    Family History  Problem Relation Age of Onset  . Anemia Mother   . Hypertension Mother   . Goiter Mother   . Cancer Father   . Cancer Sister   . Multiple sclerosis Sister      Current Outpatient Medications:  .  atenolol (TENORMIN) 25 MG tablet, Take 25 mg by mouth daily., Disp: , Rfl:  .  ciprofloxacin (CIPRO) 500 MG tablet, Take 1 tablet (500 mg total) by mouth 2 (two) times daily for 19 doses., Disp: 19 tablet, Rfl: 0 .  HYDROcodone-acetaminophen (NORCO/VICODIN) 5-325 MG tablet, Take 1 tablet by mouth every 6 (six) hours as needed for moderate pain., Disp: 15 tablet, Rfl: 0 .  lisinopril (ZESTRIL) 10 MG tablet, Take 10 mg by mouth daily., Disp: , Rfl:  .  predniSONE (DELTASONE) 50 MG tablet, Take 2 tablets each day with food early in am for 5 days while on chemo treatment, Disp: 10 tablet, Rfl: 0 .  VITAMIN D PO, Take 1 capsule by mouth daily., Disp: , Rfl:  .  aspirin EC 81 MG tablet, Take 81 mg by mouth daily as needed (heart health). Swallow whole. (Patient not taking: No sig reported), Disp: , Rfl:  .  Tenofovir Alafenamide Fumarate 25 MG TABS, Take 1 tablet (25 mg total) by mouth daily. (Patient not taking: No sig reported), Disp: 30 tablet, Rfl: 3  Physical exam:  Vitals:   07/21/20 1412  BP: 104/75  Pulse: 84  Resp: 18  Temp: 97.7 F (36.5 C)  TempSrc:  Tympanic  SpO2: 100%  Weight: 184 lb (83.5 kg)   Physical Exam Constitutional:      General: He is not in acute distress. Cardiovascular:     Rate and Rhythm: Normal rate and regular rhythm.     Heart sounds: Normal heart sounds.  Pulmonary:     Effort: Pulmonary effort is normal.     Breath sounds: Normal breath sounds.  Abdominal:     General: Bowel sounds are normal.     Palpations: Abdomen is soft.  Skin:    General: Skin is warm and dry.  Neurological:     Mental Status: He is alert and oriented to person, place, and time.      CMP Latest Ref Rng & Units 07/21/2020  Glucose 70 - 99 mg/dL 138(H)  BUN 6 - 20 mg/dL 10  Creatinine 0.61 - 1.24 mg/dL 0.97  Sodium 135 - 145 mmol/L 138  Potassium 3.5 - 5.1 mmol/L 3.6  Chloride 98 - 111 mmol/L  105  CO2 22 - 32 mmol/L 25  Calcium 8.9 - 10.3 mg/dL 9.1  Total Protein 6.5 - 8.1 g/dL -  Total Bilirubin 0.3 - 1.2 mg/dL -  Alkaline Phos 38 - 126 U/L -  AST 15 - 41 U/L -  ALT 0 - 44 U/L -   CBC Latest Ref Rng & Units 07/21/2020  WBC 4.0 - 10.5 K/uL 10.5  Hemoglobin 13.0 - 17.0 g/dL 10.7(L)  Hematocrit 39.0 - 52.0 % 31.7(L)  Platelets 150 - 400 K/uL 462(H)    No images are attached to the encounter.  DG Chest 2 View  Result Date: 07/12/2020 CLINICAL DATA:  60 year old male with weakness. Currently undergoing treatment for lymphoma. EXAM: CHEST - 2 VIEW COMPARISON:  06/24/2020 and prior chest radiographs FINDINGS: The cardiomediastinal silhouette is unchanged. Slightly prominent SUPERIOR mediastinum again noted. A RIGHT IJ Port-A-Cath is noted with tip overlying the mid SVC. There is no evidence of focal airspace disease, pulmonary edema, suspicious pulmonary nodule/mass, pleural effusion, or pneumothorax. No acute bony abnormalities are identified. IMPRESSION: No acute cardiopulmonary disease. Electronically Signed   By: Margarette Canada M.D.   On: 07/12/2020 09:57   MR BRAIN W WO CONTRAST  Result Date: 07/15/2020 CLINICAL DATA:   Lymphoma, staging EXAM: MRI HEAD WITHOUT AND WITH CONTRAST TECHNIQUE: Multiplanar, multiecho pulse sequences of the brain and surrounding structures were obtained without and with intravenous contrast. CONTRAST:  50m GADAVIST GADOBUTROL 1 MMOL/ML IV SOLN COMPARISON:  None. FINDINGS: Brain: There is no acute infarction or intracranial hemorrhage. There is no intracranial mass, mass effect, or edema. There is no hydrocephalus or extra-axial fluid collection. Ventricles and sulci are normal in size and configuration. Small focus of right subinsular T2 hyperintensity likely reflects nonspecific gliosis/demyelination. No abnormal enhancement. Vascular: Major vessel flow voids at the skull base are preserved. Skull and upper cervical spine: Normal marrow signal is preserved. Sinuses/Orbits: Minor mucosal thickening.  Orbits are unremarkable. Other: Sella is unremarkable.  Mastoid air cells are clear. IMPRESSION: No evidence of metastatic disease. Electronically Signed   By: PMacy MisM.D.   On: 07/15/2020 12:04   NM Cardiac Muga Rest  Result Date: 07/01/2020 CLINICAL DATA:  High-grade B-cell lymphoma, pre cardiotoxic chemotherapy EXAM: NUCLEAR MEDICINE CARDIAC BLOOD POOL IMAGING (MUGA) TECHNIQUE: Cardiac multi-gated acquisition was performed at rest following intravenous injection of Tc-962mabeled red blood cells. RADIOPHARMACEUTICALS:  21.059 mCi Tc-9969mrtechnetate in-vitro labeled red blood cells IV COMPARISON:  None FINDINGS: Calculated LEFT ventricular ejection fraction is 54.4%, normal. Study was obtained at a cardiac rate of 56 bpm. Patient was rhythmic during imaging. Cine analysis of the LEFT ventricle in 3 projections demonstrates normal LV wall motion. IMPRESSION: Normal LEFT ventricular ejection fraction of 54.4%. Normal LEFT ventricular wall motion. Electronically Signed   By: MarLavonia DanaD.   On: 07/01/2020 15:16   CT ABDOMEN PELVIS W CONTRAST  Result Date: 07/12/2020 CLINICAL DATA:   Abdominal pain and fever.  Nausea and diarrhea. EXAM: CT ABDOMEN AND PELVIS WITH CONTRAST TECHNIQUE: Multidetector CT imaging of the abdomen and pelvis was performed using the standard protocol following bolus administration of intravenous contrast. CONTRAST:  100m16mNIPAQUE IOHEXOL 300 MG/ML  SOLN COMPARISON:  PET CT dated 05/26/2020. FINDINGS: Lower chest: No acute abnormality. Hepatobiliary: No focal liver abnormality is seen. Gallbladder is unremarkable. No bile duct dilatation. Pancreas: Unremarkable. No pancreatic ductal dilatation or surrounding inflammatory changes. Spleen: Multiple hypodense masses within the spleen, largest measuring 2.8 cm, FDG avid on recent PET-CT indicating lymphoproliferative  disorder and presumably related to the recent diagnosis of lymphoma. Adrenals/Urinary Tract: Adrenal glands appear normal. Kidneys are unremarkable without suspicious mass, stone or hydronephrosis. No perinephric fluid. No ureteral or bladder calculi are identified. Bladder is unremarkable, partially decompressed. Stomach/Bowel: Thickening of the walls of the majority of the colon. Additional thickening and enhancement of the walls of multiple segments of small bowel, most prominent within the jejunum and ileum. Fluid is seen throughout the small bowel, with associated air-fluid levels. Stomach is unremarkable.  Appendix is normal in caliber. Vascular/Lymphatic: Aortic atherosclerosis. No acute appearing vascular abnormality. The intra-abdominal intrapelvic lymphadenopathy demonstrated on recent PET-CT has significantly improved in the interval. Representative lymphadenopathy at the RIGHT pelvic sidewall previously measured 3.5 cm greatest thickness, now measuring 1.6 cm greatest thickness. Lymph node within the LEFT periaortic retroperitoneum previously measured 1.3 cm short axis dimension, now measuring 0.7 cm short axis. Reproductive: Prostate is unremarkable. Other: No free fluid or abscess collection is  seen. No free intraperitoneal air. Musculoskeletal: No acute appearing osseous abnormality. IMPRESSION: 1. Significant thickening and enhancement of the walls of the majority of the colon and multiple segments of small bowel, most prominent within the jejunum and ileum. Differential includes diffuse enterocolitis of infectious or inflammatory nature, typhlitis, radiation induced enterocolitis (if corresponding history of radiation) and graft-versus-host disease (if any corresponding history of bone marrow transplant). 2. The intra-abdominal intrapelvic lymphadenopathy demonstrated on recent PET-CT has significantly improved in the interval indicating a good response to interval treatment. Measurements provided above. 3. Multiple hypodense masses within the spleen, largest measuring 2.8 cm, FDG avid on recent PET-CT indicating lymphoproliferative disorder and also presumably related to the recent diagnosis of lymphoma. Aortic Atherosclerosis (ICD10-I70.0). Electronically Signed   By: Franki Cabot M.D.   On: 07/12/2020 12:32   DG CHEST PORT 1 VIEW  Result Date: 06/24/2020 CLINICAL DATA:  Status post port placement EXAM: PORTABLE CHEST 1 VIEW COMPARISON:  10/21/2019 FINDINGS: Right-sided chest wall port is noted. Catheter tip is seen in the mid superior vena cava in satisfactory position. No pneumothorax is noted. Persistent fullness in the right paratracheal space is noted consistent with the patient's known history. Cardiac shadow is within normal limits. Mild bibasilar atelectasis is noted related to a poor inspiratory effort. No bony abnormality is seen. IMPRESSION: No pneumothorax following port placement. Electronically Signed   By: Inez Catalina M.D.   On: 06/24/2020 11:28   CT BONE MARROW BIOPSY & ASPIRATION  Result Date: 06/25/2020 INDICATION: New diagnosis of lymphoma. Please perform CT-guided bone marrow biopsy for tissue diagnostic purposes. EXAM: CT-GUIDED BONE MARROW BIOPSY AND ASPIRATION  MEDICATIONS: None ANESTHESIA/SEDATION: Fentanyl 50 mcg IV; Versed 1 mg IV Sedation Time: 10 Minutes; The patient was continuously monitored during the procedure by the interventional radiology nurse under my direct supervision. COMPLICATIONS: None immediate. PROCEDURE: Informed consent was obtained from the patient following an explanation of the procedure, risks, benefits and alternatives. The patient understands, agrees and consents for the procedure. All questions were addressed. A time out was performed prior to the initiation of the procedure. The patient was positioned prone and non-contrast localization CT was performed of the pelvis to demonstrate the iliac marrow spaces. The operative site was prepped and draped in the usual sterile fashion. Under sterile conditions and local anesthesia, a 22 gauge spinal needle was utilized for procedural planning. Next, an 11 gauge coaxial bone biopsy needle was advanced into the left iliac marrow space. Needle position was confirmed with CT imaging. Initially, a bone marrow aspiration  was performed. Next, a bone marrow biopsy was obtained with the 11 gauge outer bone marrow device. Samples were prepared with the cytotechnologist and deemed adequate. The needle was removed and superficial hemostasis was obtained with manual compression. A dressing was applied. The patient tolerated the procedure well without immediate post procedural complication. IMPRESSION: Successful CT guided left iliac bone marrow aspiration and core biopsy. Electronically Signed   By: Sandi Mariscal M.D.   On: 06/25/2020 10:30   DG C-Arm 1-60 Min-No Report  Result Date: 06/24/2020 Fluoroscopy was utilized by the requesting physician.  No radiographic interpretation.   DG FLUORO GUIDED LOC OF NEEDLE/CATH TIP FOR SPINAL INJECT LT  Result Date: 07/08/2020 CLINICAL DATA:  Non-Hodgkin's lymphoma EXAM: DIAGNOSTIC LUMBAR PUNCTURE UNDER FLUOROSCOPIC GUIDANCE COMPARISON:  None FLUOROSCOPY TIME:   Fluoroscopy Time:  30 seconds Radiation Exposure Index (if provided by the fluoroscopic device): 4.9 mGy Number of Acquired Spot Images: 0 PROCEDURE: Informed consent was obtained from the patient prior to the procedure, including potential complications of headache, allergy, and pain. With the patient prone, the lower back was prepped with Betadine. 1% Lidocaine was used for local anesthesia. Lumbar puncture was performed at the L4-5 level using a 22 gauge needle with return of clear 5 ml of CSF were obtained for laboratory studies. Subsequently, 12 mg of methotrexate in 5 mL was hand injected into the thecal sac. The patient tolerated the procedure well and there were no apparent complications. IMPRESSION: Successful fluoroscopic guided lumbar puncture for methotrexate injection. Electronically Signed   By: Kathreen Devoid   On: 07/08/2020 13:54     Assessment and plan- Patient is a 60 y.o. male  with stage IV triple hit high-grade B-cell lymphoma.  He is s/p 1 cycle of R-CHOP chemotherapy complicated by Salmonella enterocolitis here for posthospital discharge follow-up  Symptoms of enterocolitis have resolved.  He is now having regular bowel movements with no abdominal pain diarrhea or blood in stools.  He will be completing ciprofloxacin course this week.  Triple hit B-cell lymphoma: Patient was ideally supposed to start chemotherapy on 07/28/2020 when his counts are back to baseline.  However given his recent episode of enterocolitis I will delay treatment by 1 week and plan to see him on 08/04/2020.  We discussed doing dose adjusted R-EPOCH chemotherapy either inpatient versus outpatient.  Today patient states that he would like to try outpatient  I will plan to see him on 08/04/2020 with port labs CBC with differential CMP LDH and uric acid and he will receive Rituxan on day 1 and get outpatient etoposide on day 2 and 3 and 4 along with doxorubicin and vincristine on day 2.  He will come back on day 5 for  Cytoxan and get on for Neulasta on that day.  He will also receive intrathecal methotrexate on 08/12/2020.  He will remain at the same dose that he received last time as his ANC was less than 0.5 on 2 separate occasions.  He is yet to receive evushield for COVID prophylaxis and we will look into that as well.   Visit Diagnosis 1. High grade B-cell lymphoma ([place])   2. Antineoplastic chemotherapy induced anemia   3. Enterocolitis      Dr. Randa Evens, MD, MPH [facility] at [facility] [phone] 07/21/2020 4:29 PM

## 2020-07-22 ENCOUNTER — Inpatient Hospital Stay: Payer: Self-pay

## 2020-07-22 ENCOUNTER — Other Ambulatory Visit: Payer: Self-pay | Admitting: *Deleted

## 2020-07-22 DIAGNOSIS — C8510 Unspecified B-cell lymphoma, unspecified site: Secondary | ICD-10-CM

## 2020-07-23 ENCOUNTER — Other Ambulatory Visit: Payer: Self-pay | Admitting: *Deleted

## 2020-07-23 DIAGNOSIS — C8510 Unspecified B-cell lymphoma, unspecified site: Secondary | ICD-10-CM

## 2020-07-24 ENCOUNTER — Inpatient Hospital Stay: Payer: Self-pay

## 2020-07-28 ENCOUNTER — Inpatient Hospital Stay: Payer: Self-pay

## 2020-07-28 ENCOUNTER — Telehealth: Payer: Self-pay | Admitting: Adult Health

## 2020-07-28 ENCOUNTER — Inpatient Hospital Stay: Payer: Self-pay | Admitting: Oncology

## 2020-07-28 NOTE — Telephone Encounter (Signed)
I called patient to discuss Evusheld, a long acting monoclonal antibody injection administered to patients with decreased immune systems or intolerance/allergy to the COVID 19 vaccine as COVID19 prevention.    Christopher Mills would like more information about this before proceeding.  I am sending this through my chart.  Wilber Bihari, NP

## 2020-07-29 ENCOUNTER — Inpatient Hospital Stay: Payer: Self-pay

## 2020-07-30 ENCOUNTER — Inpatient Hospital Stay: Payer: Self-pay

## 2020-07-31 ENCOUNTER — Inpatient Hospital Stay: Payer: Self-pay

## 2020-08-01 ENCOUNTER — Ambulatory Visit: Payer: Self-pay

## 2020-08-04 ENCOUNTER — Encounter: Payer: Self-pay | Admitting: Oncology

## 2020-08-04 ENCOUNTER — Ambulatory Visit: Payer: Self-pay

## 2020-08-04 ENCOUNTER — Inpatient Hospital Stay: Payer: Self-pay

## 2020-08-04 ENCOUNTER — Other Ambulatory Visit: Payer: Self-pay

## 2020-08-04 ENCOUNTER — Inpatient Hospital Stay (HOSPITAL_BASED_OUTPATIENT_CLINIC_OR_DEPARTMENT_OTHER): Payer: Self-pay | Admitting: Oncology

## 2020-08-04 VITALS — BP 110/79 | HR 73 | Temp 96.8°F | Resp 20 | Wt 180.5 lb

## 2020-08-04 DIAGNOSIS — C8510 Unspecified B-cell lymphoma, unspecified site: Secondary | ICD-10-CM

## 2020-08-04 DIAGNOSIS — Z5111 Encounter for antineoplastic chemotherapy: Secondary | ICD-10-CM

## 2020-08-04 DIAGNOSIS — D708 Other neutropenia: Secondary | ICD-10-CM

## 2020-08-04 LAB — COMPREHENSIVE METABOLIC PANEL
ALT: 20 U/L (ref 0–44)
AST: 29 U/L (ref 15–41)
Albumin: 3.6 g/dL (ref 3.5–5.0)
Alkaline Phosphatase: 86 U/L (ref 38–126)
Anion gap: 11 (ref 5–15)
BUN: 13 mg/dL (ref 6–20)
CO2: 23 mmol/L (ref 22–32)
Calcium: 8.8 mg/dL — ABNORMAL LOW (ref 8.9–10.3)
Chloride: 105 mmol/L (ref 98–111)
Creatinine, Ser: 0.99 mg/dL (ref 0.61–1.24)
GFR, Estimated: >60 mL/min (ref >60)
Glucose, Bld: 129 mg/dL — ABNORMAL HIGH (ref 70–99)
Potassium: 3.4 mmol/L — ABNORMAL LOW (ref 3.5–5.1)
Sodium: 139 mmol/L (ref 135–145)
Total Bilirubin: 0.6 mg/dL (ref 0.3–1.2)
Total Protein: 6.3 g/dL — ABNORMAL LOW (ref 6.5–8.1)

## 2020-08-04 LAB — CBC WITH DIFFERENTIAL/PLATELET
Abs Immature Granulocytes: 0 10*3/uL (ref 0.00–0.07)
Basophils Absolute: 0 10*3/uL (ref 0.0–0.1)
Basophils Relative: 1 %
Eosinophils Absolute: 0.1 10*3/uL (ref 0.0–0.5)
Eosinophils Relative: 3 %
HCT: 32.8 % — ABNORMAL LOW (ref 39.0–52.0)
Hemoglobin: 11 g/dL — ABNORMAL LOW (ref 13.0–17.0)
Immature Granulocytes: 0 %
Lymphocytes Relative: 44 %
Lymphs Abs: 1.3 10*3/uL (ref 0.7–4.0)
MCH: 29.9 pg (ref 26.0–34.0)
MCHC: 33.5 g/dL (ref 30.0–36.0)
MCV: 89.1 fL (ref 80.0–100.0)
Monocytes Absolute: 0.4 10*3/uL (ref 0.1–1.0)
Monocytes Relative: 15 %
Neutro Abs: 1.1 10*3/uL — ABNORMAL LOW (ref 1.7–7.7)
Neutrophils Relative %: 37 %
Platelets: 306 10*3/uL (ref 150–400)
RBC: 3.68 MIL/uL — ABNORMAL LOW (ref 4.22–5.81)
RDW: 16.2 % — ABNORMAL HIGH (ref 11.5–15.5)
Smear Review: NORMAL
WBC: 2.9 10*3/uL — ABNORMAL LOW (ref 4.0–10.5)
nRBC: 0 % (ref 0.0–0.2)

## 2020-08-04 LAB — LACTATE DEHYDROGENASE: LDH: 141 U/L (ref 98–192)

## 2020-08-04 LAB — URIC ACID: Uric Acid, Serum: 3.7 mg/dL (ref 3.7–8.6)

## 2020-08-04 MED ORDER — SODIUM CHLORIDE 0.9% FLUSH
10.0000 mL | Freq: Once | INTRAVENOUS | Status: AC
  Administered 2020-08-04: 10 mL via INTRAVENOUS
  Filled 2020-08-04: qty 10

## 2020-08-05 ENCOUNTER — Inpatient Hospital Stay: Payer: Self-pay

## 2020-08-06 ENCOUNTER — Telehealth: Payer: Self-pay | Admitting: Oncology

## 2020-08-06 ENCOUNTER — Inpatient Hospital Stay: Payer: Self-pay

## 2020-08-06 NOTE — Telephone Encounter (Signed)
Spoke with patient to confirm appt time with Faythe Casa NP at [facility] on 5/31 and starting chemo after. Patient was agreeable.

## 2020-08-07 ENCOUNTER — Other Ambulatory Visit: Payer: Self-pay | Admitting: Oncology

## 2020-08-07 ENCOUNTER — Inpatient Hospital Stay: Payer: Self-pay

## 2020-08-07 ENCOUNTER — Encounter: Payer: Self-pay | Admitting: Oncology

## 2020-08-07 NOTE — Progress Notes (Signed)
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 This was followed by a PET CT scan which showed extensive FDG avid adenopathy in the neck chest abdomen and pelvis. FDG avid splenic lesions. Solitary intramuscular FDG avid lesion in the right biceps femoris muscle.  Supraclavicular excisional lymph node biopsy showed high-grade B-cell lymphoma [facility] type Ki-67 greater than 95%. FISH testing was positive for Bcl-2 BCL6 and MYC consistent with triple hit lymphoma.By NCCN IPI score would be 4 based on age and elevated LDH and stage IV(intramuscular biceps femoris lesion)which puts him in the high intermediate risk group. CNS IPI score 4   Bone marrow biopsy showed involvement with low-grade B-cell lymphoproliferative disorder with no evidence ofHigh-grade B-cell lymphoma.  Patient received RCHOP for cycle 1 with plans for DA [facility] with cycle 2. IT MTX for CNS prophylaxis.  MRI brain negative for lymphoma   Interval history-patient feels well overall and is back to his baseline.  Denies any specific complaints at this time  ECOG PS- 1 Pain scale-0   Review of systems- Review of Systems  Constitutional: Negative for chills, fever, malaise/fatigue and weight  loss.  HENT: Negative for congestion, ear discharge and nosebleeds.   Eyes: Negative for blurred vision.  Respiratory: Negative for cough, hemoptysis, sputum production, shortness of breath and wheezing.   Cardiovascular: Negative for chest pain, palpitations, orthopnea and claudication.  Gastrointestinal: Negative for abdominal pain, blood in stool, constipation, diarrhea, heartburn, melena, nausea and vomiting.  Genitourinary: Negative for dysuria, flank pain, frequency, hematuria and urgency.  Musculoskeletal: Negative for back pain, joint pain and myalgias.  Skin: Negative for rash.  Neurological: Negative for dizziness, tingling, focal weakness, seizures, weakness and headaches.  Endo/Heme/Allergies: Does not bruise/bleed easily.  Psychiatric/Behavioral: Negative for depression and suicidal ideas. The patient does not have insomnia.       No Known Allergies   Past Medical History:  Diagnosis Date  . Acute kidney injury ([place]) 07/12/2020  . Dyspnea   . Hypertension   . Lymphoma of lymph nodes of neck ([place]) 06/18/2020     Past Surgical History:  Procedure Laterality Date  . BONE MARROW BIOPSY    . COLONOSCOPY    . EXCISION MASS NECK Right 06/12/2020   Procedure: EXCISION MASS NECK;  Surgeon: Jules Husbands, MD;  Location: [facility] ORS;  Service: General;  Laterality: Right;  . HERNIA REPAIR Right    at age 32-RIH  . PORTA CATH INSERTION    . PORTACATH PLACEMENT Right 06/24/2020   Procedure: INSERTION PORT-A-CATH;  Surgeon: Jules Husbands, MD;  Location: [facility] ORS;  Service: General;  Laterality: Right;    Social History   Socioeconomic History  . Marital status: Single    Spouse name: Not on file  . Number of children: Not on file  . Years of education: Not on file  .  Highest education level: Not on file  Occupational History  . Not on file  Tobacco Use  . Smoking status: Former Smoker    Types: Cigarettes    Quit date: 06/13/2020    Years since quitting: 0.1  . Smokeless  tobacco: Never Used  Vaping Use  . Vaping Use: Never used  Substance and Sexual Activity  . Alcohol use: Not Currently  . Drug use: Not Currently    Types: Marijuana  . Sexual activity: Not Currently  Other Topics Concern  . Not on file  Social History Narrative   Has daughter and grandson in the home   Social Determinants of Health   Financial Resource Strain: Not on file  Food Insecurity: Not on file  Transportation Needs: Not on file  Physical Activity: Not on file  Stress: Not on file  Social Connections: Not on file  Intimate Partner Violence: Not on file    Family History  Problem Relation Age of Onset  . Anemia Mother   . Hypertension Mother   . Goiter Mother   . Cancer Father   . Cancer Sister   . Multiple sclerosis Sister      Current Outpatient Medications:  .  atenolol (TENORMIN) 25 MG tablet, Take 25 mg by mouth daily., Disp: , Rfl:  .  HYDROcodone-acetaminophen (NORCO/VICODIN) 5-325 MG tablet, Take 1 tablet by mouth every 6 (six) hours as needed for moderate pain., Disp: 15 tablet, Rfl: 0 .  lisinopril (ZESTRIL) 10 MG tablet, Take 10 mg by mouth daily., Disp: , Rfl:  .  predniSONE (DELTASONE) 50 MG tablet, Take 2 tablets each day with food early in am for 5 days while on chemo treatment, Disp: 10 tablet, Rfl: 0 .  VITAMIN D PO, Take 1 capsule by mouth daily., Disp: , Rfl:  .  aspirin EC 81 MG tablet, Take 81 mg by mouth daily as needed (heart health). Swallow whole. (Patient not taking: No sig reported), Disp: , Rfl:  .  Tenofovir Alafenamide Fumarate 25 MG TABS, Take 1 tablet (25 mg total) by mouth daily. (Patient not taking: No sig reported), Disp: 30 tablet, Rfl: 3  Physical exam:  Vitals:   08/04/20 0852  BP: 110/79  Pulse: 73  Resp: 20  Temp: (!) 96.8 F (36 C)  TempSrc: Tympanic  SpO2: 100%  Weight: 180 lb 8 oz (81.9 kg)   Physical Exam Cardiovascular:     Rate and Rhythm: Normal rate and regular rhythm.     Heart sounds: Normal heart  sounds.  Pulmonary:     Effort: Pulmonary effort is normal.     Breath sounds: Normal breath sounds.  Abdominal:     General: Bowel sounds are normal.     Palpations: Abdomen is soft.  Lymphadenopathy:     Comments: No palpable cervical or axillary adenopathy  Skin:    General: Skin is warm and dry.  Neurological:     Mental Status: He is alert and oriented to person, place, and time.      CMP Latest Ref Rng & Units 08/04/2020  Glucose 70 - 99 mg/dL 129(H)  BUN 6 - 20 mg/dL 13  Creatinine 0.61 - 1.24 mg/dL 0.99  Sodium 135 - 145 mmol/L 139  Potassium 3.5 - 5.1 mmol/L 3.4(L)  Chloride 98 - 111 mmol/L 105  CO2 22 - 32 mmol/L 23  Calcium 8.9 - 10.3 mg/dL 8.8(L)  Total Protein 6.5 - 8.1 g/dL 6.3(L)  Total Bilirubin 0.3 - 1.2 mg/dL 0.6  Alkaline Phos 38 - 126 U/L 86  AST 15 - 41 U/L 29  ALT 0 - 44 U/L 20   CBC Latest Ref Rng & Units 08/04/2020  WBC 4.0 - 10.5 K/uL 2.9(L)  Hemoglobin 13.0 - 17.0 g/dL 11.0(L)  Hematocrit 39.0 - 52.0 % 32.8(L)  Platelets 150 - 400 K/uL 306    No images are attached to the encounter.  DG Chest 2 View  Result Date: 07/12/2020 CLINICAL DATA:  60 year old male with weakness. Currently undergoing treatment for lymphoma. EXAM: CHEST - 2 VIEW COMPARISON:  06/24/2020 and prior chest radiographs FINDINGS: The cardiomediastinal silhouette is unchanged. Slightly prominent SUPERIOR mediastinum again noted. A RIGHT IJ Port-A-Cath is noted with tip overlying the mid SVC. There is no evidence of focal airspace disease, pulmonary edema, suspicious pulmonary nodule/mass, pleural effusion, or pneumothorax. No acute bony abnormalities are identified. IMPRESSION: No acute cardiopulmonary disease. Electronically Signed   By: Margarette Canada M.D.   On: 07/12/2020 09:57   MR BRAIN W WO CONTRAST  Result Date: 07/15/2020 CLINICAL DATA:  Lymphoma, staging EXAM: MRI HEAD WITHOUT AND WITH CONTRAST TECHNIQUE: Multiplanar, multiecho pulse sequences of the brain and surrounding  structures were obtained without and with intravenous contrast. CONTRAST:  72m GADAVIST GADOBUTROL 1 MMOL/ML IV SOLN COMPARISON:  None. FINDINGS: Brain: There is no acute infarction or intracranial hemorrhage. There is no intracranial mass, mass effect, or edema. There is no hydrocephalus or extra-axial fluid collection. Ventricles and sulci are normal in size and configuration. Small focus of right subinsular T2 hyperintensity likely reflects nonspecific gliosis/demyelination. No abnormal enhancement. Vascular: Major vessel flow voids at the skull base are preserved. Skull and upper cervical spine: Normal marrow signal is preserved. Sinuses/Orbits: Minor mucosal thickening.  Orbits are unremarkable. Other: Sella is unremarkable.  Mastoid air cells are clear. IMPRESSION: No evidence of metastatic disease. Electronically Signed   By: PMacy MisM.D.   On: 07/15/2020 12:04   CT ABDOMEN PELVIS W CONTRAST  Result Date: 07/12/2020 CLINICAL DATA:  Abdominal pain and fever.  Nausea and diarrhea. EXAM: CT ABDOMEN AND PELVIS WITH CONTRAST TECHNIQUE: Multidetector CT imaging of the abdomen and pelvis was performed using the standard protocol following bolus administration of intravenous contrast. CONTRAST:  1070mOMNIPAQUE IOHEXOL 300 MG/ML  SOLN COMPARISON:  PET CT dated 05/26/2020. FINDINGS: Lower chest: No acute abnormality. Hepatobiliary: No focal liver abnormality is seen. Gallbladder is unremarkable. No bile duct dilatation. Pancreas: Unremarkable. No pancreatic ductal dilatation or surrounding inflammatory changes. Spleen: Multiple hypodense masses within the spleen, largest measuring 2.8 cm, FDG avid on recent PET-CT indicating lymphoproliferative disorder and presumably related to the recent diagnosis of lymphoma. Adrenals/Urinary Tract: Adrenal glands appear normal. Kidneys are unremarkable without suspicious mass, stone or hydronephrosis. No perinephric fluid. No ureteral or bladder calculi are identified.  Bladder is unremarkable, partially decompressed. Stomach/Bowel: Thickening of the walls of the majority of the colon. Additional thickening and enhancement of the walls of multiple segments of small bowel, most prominent within the jejunum and ileum. Fluid is seen throughout the small bowel, with associated air-fluid levels. Stomach is unremarkable.  Appendix is normal in caliber. Vascular/Lymphatic: Aortic atherosclerosis. No acute appearing vascular abnormality. The intra-abdominal intrapelvic lymphadenopathy demonstrated on recent PET-CT has significantly improved in the interval. Representative lymphadenopathy at the RIGHT pelvic sidewall previously measured 3.5 cm greatest thickness, now measuring 1.6 cm greatest thickness. Lymph node within the LEFT periaortic retroperitoneum previously measured 1.3 cm short axis dimension, now measuring 0.7 cm short axis. Reproductive: Prostate is unremarkable.  Other: No free fluid or abscess collection is seen. No free intraperitoneal air. Musculoskeletal: No acute appearing osseous abnormality. IMPRESSION: 1. Significant thickening and enhancement of the walls of the majority of the colon and multiple segments of small bowel, most prominent within the jejunum and ileum. Differential includes diffuse enterocolitis of infectious or inflammatory nature, typhlitis, radiation induced enterocolitis (if corresponding history of radiation) and graft-versus-host disease (if any corresponding history of bone marrow transplant). 2. The intra-abdominal intrapelvic lymphadenopathy demonstrated on recent PET-CT has significantly improved in the interval indicating a good response to interval treatment. Measurements provided above. 3. Multiple hypodense masses within the spleen, largest measuring 2.8 cm, FDG avid on recent PET-CT indicating lymphoproliferative disorder and also presumably related to the recent diagnosis of lymphoma. Aortic Atherosclerosis (ICD10-I70.0). Electronically  Signed   By: Franki Cabot M.D.   On: 07/12/2020 12:32   DG FLUORO GUIDED LOC OF NEEDLE/CATH TIP FOR SPINAL INJECT LT  Result Date: 07/08/2020 CLINICAL DATA:  Non-Hodgkin's lymphoma EXAM: DIAGNOSTIC LUMBAR PUNCTURE UNDER FLUOROSCOPIC GUIDANCE COMPARISON:  None FLUOROSCOPY TIME:  Fluoroscopy Time:  30 seconds Radiation Exposure Index (if provided by the fluoroscopic device): 4.9 mGy Number of Acquired Spot Images: 0 PROCEDURE: Informed consent was obtained from the patient prior to the procedure, including potential complications of headache, allergy, and pain. With the patient prone, the lower back was prepped with Betadine. 1% Lidocaine was used for local anesthesia. Lumbar puncture was performed at the L4-5 level using a 22 gauge needle with return of clear 5 ml of CSF were obtained for laboratory studies. Subsequently, 12 mg of methotrexate in 5 mL was hand injected into the thecal sac. The patient tolerated the procedure well and there were no apparent complications. IMPRESSION: Successful fluoroscopic guided lumbar puncture for methotrexate injection. Electronically Signed   By: Kathreen Devoid   On: 07/08/2020 13:54     Assessment and plan- Patient is a 60 y.o. male  with stage IV triple hit high-grade B-cell lymphoma.  He is here for on treatment assessment prior to cycle 2 of dose adjusted R-EPOCH chemotherapy  Cycle 1 of chemotherapy was complicated by Salmonella enterocolitis requiring a brief hospitalization.  He did receive 1 for Neulasta with cycle 1 but did not receive etoposide.  His counts did turn back to baseline at 3 weeks but chemotherapy was delayed by 1 more week to allow him to recover from his enterocolitis completely.  Today patient feels back to his baseline and has no significant complaints at this time.  He has not received chemotherapy for 4 weeks now.However his CBC shows white count of 2.9 with an ANC of 1.1 for unclear reasons.  A week ago his [place] was 7.1 and he has not  received any treatment in the interim.  I will therefore have to delay his chemotherapy by 1 more week  He will return to clinic on 08/12/2020 and see covering NP Faythe Casa.  Based on his counts he can proceed with outpatient dose adjusted R-EPOCH chemotherapy.  This is given as Rituxan on day 1 followed by etoposide and doxorubicin on day 2.  Etoposide on day 3 and day 4.  This will be given as a 24-hour infusion.  He will return to clinic on day 5 which would be a Saturday and received Cytoxan which will be given at [facility].  He will also receive on pro Neulasta support on that day.  Patient will also need to take prednisone 100 mg from day 1 to day  5  He will be scheduled for intrathecal methotrexate on 08/18/2020.  Patient will need to be seen on a weekly basis and get labs CBC with differential checked twice a week on Tuesdays and Thursdays.  I will see him on 09/01/2020 for cycle 3 of dose adjusted R-EPOCH chemotherapy  Clinically patient has responded well with resolution of neck adenopathy as well as improvement of his intra-abdominal adenopathy noted on his recent CT scans.  His LDH has normalized and he is not presently at a risk of tumor lysis syndrome.  Scans after 3 cycles   Visit Diagnosis 1. High grade B-cell lymphoma ([place])   2. Encounter for antineoplastic chemotherapy   3. Other neutropenia ([place])      Dr. Randa Evens, MD, MPH [facility] [phone] 08/07/2020 8:43 AM

## 2020-08-08 ENCOUNTER — Inpatient Hospital Stay: Payer: Self-pay

## 2020-08-12 ENCOUNTER — Inpatient Hospital Stay: Payer: Self-pay

## 2020-08-12 ENCOUNTER — Encounter: Payer: Self-pay | Admitting: Oncology

## 2020-08-12 ENCOUNTER — Inpatient Hospital Stay (HOSPITAL_BASED_OUTPATIENT_CLINIC_OR_DEPARTMENT_OTHER): Payer: Self-pay | Admitting: Oncology

## 2020-08-12 ENCOUNTER — Other Ambulatory Visit: Payer: Self-pay | Admitting: *Deleted

## 2020-08-12 VITALS — BP 118/73 | HR 53 | Temp 97.0°F

## 2020-08-12 VITALS — BP 123/80 | HR 70 | Temp 97.1°F | Resp 16 | Ht 73.0 in | Wt 180.8 lb

## 2020-08-12 DIAGNOSIS — C8510 Unspecified B-cell lymphoma, unspecified site: Secondary | ICD-10-CM

## 2020-08-12 LAB — CBC WITH DIFFERENTIAL/PLATELET
Abs Immature Granulocytes: 0.01 10*3/uL (ref 0.00–0.07)
Basophils Absolute: 0.1 10*3/uL (ref 0.0–0.1)
Basophils Relative: 1 %
Eosinophils Absolute: 0.2 10*3/uL (ref 0.0–0.5)
Eosinophils Relative: 5 %
HCT: 36.8 % — ABNORMAL LOW (ref 39.0–52.0)
Hemoglobin: 12.4 g/dL — ABNORMAL LOW (ref 13.0–17.0)
Immature Granulocytes: 0 %
Lymphocytes Relative: 33 %
Lymphs Abs: 1.5 10*3/uL (ref 0.7–4.0)
MCH: 30.4 pg (ref 26.0–34.0)
MCHC: 33.7 g/dL (ref 30.0–36.0)
MCV: 90.2 fL (ref 80.0–100.0)
Monocytes Absolute: 0.6 10*3/uL (ref 0.1–1.0)
Monocytes Relative: 14 %
Neutro Abs: 2.2 10*3/uL (ref 1.7–7.7)
Neutrophils Relative %: 47 %
Platelets: 260 10*3/uL (ref 150–400)
RBC: 4.08 MIL/uL — ABNORMAL LOW (ref 4.22–5.81)
RDW: 16.7 % — ABNORMAL HIGH (ref 11.5–15.5)
WBC: 4.6 10*3/uL (ref 4.0–10.5)
nRBC: 0 % (ref 0.0–0.2)

## 2020-08-12 LAB — COMPREHENSIVE METABOLIC PANEL
ALT: 18 U/L (ref 0–44)
AST: 25 U/L (ref 15–41)
Albumin: 4 g/dL (ref 3.5–5.0)
Alkaline Phosphatase: 94 U/L (ref 38–126)
Anion gap: 10 (ref 5–15)
BUN: 11 mg/dL (ref 6–20)
CO2: 23 mmol/L (ref 22–32)
Calcium: 9 mg/dL (ref 8.9–10.3)
Chloride: 102 mmol/L (ref 98–111)
Creatinine, Ser: 0.86 mg/dL (ref 0.61–1.24)
GFR, Estimated: >60 mL/min (ref >60)
Glucose, Bld: 132 mg/dL — ABNORMAL HIGH (ref 70–99)
Potassium: 3.6 mmol/L (ref 3.5–5.1)
Sodium: 135 mmol/L (ref 135–145)
Total Bilirubin: 0.6 mg/dL (ref 0.3–1.2)
Total Protein: 6.9 g/dL (ref 6.5–8.1)

## 2020-08-12 MED ORDER — ETOPOSIDE CHEMO INJECTION 500 MG/25ML
Freq: Once | INTRAVENOUS | Status: AC
  Filled 2020-08-12: qty 10

## 2020-08-12 MED ORDER — SODIUM CHLORIDE 0.9 % IV SOLN
INTRAVENOUS | Status: DC
  Filled 2020-08-12: qty 250

## 2020-08-12 MED ORDER — SODIUM CHLORIDE 0.9 % IV SOLN
Freq: Once | INTRAVENOUS | Status: AC
  Administered 2020-08-12: 10 mg via INTRAVENOUS
  Filled 2020-08-12: qty 4

## 2020-08-12 MED ORDER — SODIUM CHLORIDE 0.9 % IV SOLN
375.0000 mg/m2 | Freq: Once | INTRAVENOUS | Status: AC
  Administered 2020-08-12: 800 mg via INTRAVENOUS
  Filled 2020-08-12: qty 50

## 2020-08-12 MED ORDER — DIPHENHYDRAMINE HCL 25 MG PO CAPS
50.0000 mg | ORAL_CAPSULE | Freq: Once | ORAL | Status: AC
  Administered 2020-08-12: 50 mg via ORAL
  Filled 2020-08-12: qty 2

## 2020-08-12 MED ORDER — PREDNISONE 50 MG PO TABS: ORAL_TABLET | ORAL | 0 refills | Status: DC

## 2020-08-12 MED ORDER — ACETAMINOPHEN 325 MG PO TABS
650.0000 mg | ORAL_TABLET | Freq: Once | ORAL | Status: AC
  Administered 2020-08-12: 650 mg via ORAL
  Filled 2020-08-12: qty 2

## 2020-08-12 MED ORDER — SODIUM CHLORIDE 0.9% FLUSH
10.0000 mL | Freq: Once | INTRAVENOUS | Status: AC
  Administered 2020-08-12: 10 mL via INTRAVENOUS
  Filled 2020-08-12: qty 10

## 2020-08-12 MED ORDER — SODIUM CHLORIDE 0.9 % IV SOLN: 375.0000 mg/m2 | Freq: Once | INTRAVENOUS | Status: DC

## 2020-08-12 NOTE — Patient Instructions (Addendum)
[place] ONCOL[facility]  Discharge Instructions: Thank you for choosing [facility] to provide your oncology and hematology care.  If you have a lab appointment with the [facility], please go directly to the [facility] and check in at the registration area.  Wear comfortable clothing and clothing appropriate for easy access to any Portacath or PICC line.   We strive to give you quality time with your provider. You may need to reschedule your appointment if you arrive late (15 or more minutes).  Arriving late affects you and other patients whose appointments are after yours.  Also, if you miss three or more appointments without notifying the office, you may be dismissed from the clinic at the provider's discretion.      For prescription refill requests, have your pharmacy contact our office and allow 72 hours for refills to be completed.    Today you received the following chemotherapy and/or immunotherapy agents : Rituxan , ( Etoposide, Adriamycin, Vincristine -  chemo in pump )     To help prevent nausea and vomiting after your treatment, we encourage you to take your nausea medication as directed.  BELOW ARE SYMPTOMS THAT SHOULD BE REPORTED IMMEDIATELY: . *FEVER GREATER THAN 100.4 F (38 C) OR HIGHER . *CHILLS OR SWEATING . *NAUSEA AND VOMITING THAT IS NOT CONTROLLED WITH YOUR NAUSEA MEDICATION . *UNUSUAL SHORTNESS OF BREATH . *UNUSUAL BRUISING OR BLEEDING . *URINARY PROBLEMS (pain or burning when urinating, or frequent urination) . *BOWEL PROBLEMS (unusual diarrhea, constipation, pain near the anus) . TENDERNESS IN MOUTH AND THROAT WITH OR WITHOUT PRESENCE OF ULCERS (sore throat, sores in mouth, or a toothache) . UNUSUAL RASH, SWELLING OR PAIN  . UNUSUAL VAGINAL DISCHARGE OR ITCHING   Items with * indicate a potential emergency and should be followed up as soon as possible or go to the Emergency Department if any problems should  occur.  Please show the CHEMOTHERAPY ALERT CARD or IMMUNOTHERAPY ALERT CARD at check-in to the Emergency Department and triage nurse.  Should you have questions after your visit or need to cancel or reschedule your appointment, please contact [facility]  [phone] and follow the prompts.  Office hours are 8:00 a.m. to 4:30 p.m. Monday - Friday. Please note that voicemails left after 4:00 p.m. may not be returned until the following business day.  We are closed weekends and major holidays. You have access to a nurse at all times for urgent questions. Please call the main number to the clinic [phone] and follow the prompts.  For any non-urgent questions, you may also contact your provider using MyChart. We now offer e-Visits for anyone 70 and older to request care online for non-urgent symptoms. For details visit [email or web address].   Also download the MyChart app! Go to the app store, search "MyChart", open the app, select [place], and log in with your MyChart username and password.  Due to Covid, a mask is required upon entering the hospital/clinic. If you do not have a mask, one will be given to you upon arrival. For doctor visits, patients may have 1 support person aged 4 or older with them. For treatment visits, patients cannot have anyone with them due to current Covid guidelines and our immunocompromised population.   Etoposide, VP-16 injection What is this medicine? ETOPOSIDE, VP-16 (e toe POE side) is a chemotherapy drug. It is used to treat testicular cancer, lung cancer, and other cancers. This medicine may be used  for other purposes; ask your health care provider or pharmacist if you have questions. COMMON BRAND NAME(S): Etopophos, Toposar, VePesid What should I tell my health care provider before I take this medicine? They need to know if you have any of these conditions:  infection  kidney disease  liver disease  low blood  counts, like low white cell, platelet, or red cell counts  an unusual or allergic reaction to etoposide, other medicines, foods, dyes, or preservatives  pregnant or trying to get pregnant  breast-feeding How should I use this medicine? This medicine is for infusion into a vein. It is administered in a hospital or clinic by a specially trained health care professional. Talk to your pediatrician regarding the use of this medicine in children. Special care may be needed. Overdosage: If you think you have taken too much of this medicine contact a poison control center or emergency room at once. NOTE: This medicine is only for you. Do not share this medicine with others. What if I miss a dose? It is important not to miss your dose. Call your doctor or health care professional if you are unable to keep an appointment. What may interact with this medicine? This medicine may interact with the following medications:  warfarin This list may not describe all possible interactions. Give your health care provider a list of all the medicines, herbs, non-prescription drugs, or dietary supplements you use. Also tell them if you smoke, drink alcohol, or use illegal drugs. Some items may interact with your medicine. What should I watch for while using this medicine? Visit your doctor for checks on your progress. This drug may make you feel generally unwell. This is not uncommon, as chemotherapy can affect healthy cells as well as cancer cells. Report any side effects. Continue your course of treatment even though you feel ill unless your doctor tells you to stop. In some cases, you may be given additional medicines to help with side effects. Follow all directions for their use. Call your doctor or health care professional for advice if you get a fever, chills or sore throat, or other symptoms of a cold or flu. Do not treat yourself. This drug decreases your body's ability to fight infections. Try to avoid being  around people who are sick. This medicine may increase your risk to bruise or bleed. Call your doctor or health care professional if you notice any unusual bleeding. Talk to your doctor about your risk of cancer. You may be more at risk for certain types of cancers if you take this medicine. Do not become pregnant while taking this medicine or for at least 6 months after stopping it. Women should inform their doctor if they wish to become pregnant or think they might be pregnant. Women of child-bearing potential will need to have a negative pregnancy test before starting this medicine. There is a potential for serious side effects to an unborn child. Talk to your health care professional or pharmacist for more information. Do not breast-feed an infant while taking this medicine. Men must use a latex condom during sexual contact with a woman while taking this medicine and for at least 4 months after stopping it. A latex condom is needed even if you have had a vasectomy. Contact your doctor right away if your partner becomes pregnant. Do not donate sperm while taking this medicine and for at least 4 months after you stop taking this medicine. Men should inform their doctors if they wish to father a  child. This medicine may lower sperm counts. What side effects may I notice from receiving this medicine? Side effects that you should report to your doctor or health care professional as soon as possible:  allergic reactions like skin rash, itching or hives, swelling of the face, lips, or tongue  low blood counts - this medicine may decrease the number of white blood cells, red blood cells, and platelets. You may be at increased risk for infections and bleeding  nausea, vomiting  redness, blistering, peeling or loosening of the skin, including inside the mouth  signs and symptoms of infection like fever; chills; cough; sore throat; pain or trouble passing urine  signs and symptoms of low red blood cells  or anemia such as unusually weak or tired; feeling faint or lightheaded; falls; breathing problems  unusual bruising or bleeding Side effects that usually do not require medical attention (report to your doctor or health care professional if they continue or are bothersome):  changes in taste  diarrhea  hair loss  loss of appetite  mouth sores This list may not describe all possible side effects. Call your doctor for medical advice about side effects. You may report side effects to FDA at 1-800-FDA-1088. Where should I keep my medicine? This drug is given in a hospital or clinic and will not be stored at home. NOTE: This sheet is a summary. It may not cover all possible information. If you have questions about this medicine, talk to your doctor, pharmacist, or health care provider.  2021 Elsevier/Gold Standard (2018-04-26 16:57:15)

## 2020-08-12 NOTE — Progress Notes (Signed)
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 This was followed by a PET CT scan which showed extensive FDG avid adenopathy in the neck chest abdomen and pelvis. FDG avid splenic lesions. Solitary intramuscular FDG avid lesion in the right biceps femoris muscle.  Supraclavicular excisional lymph node biopsy showed high-grade B-cell lymphoma [facility] type Ki-67 greater than 95%. FISH testing was positive for Bcl-2 BCL6 and MYC consistent with triple hit lymphoma.By NCCN IPI score would be 4 based on age and elevated LDH and stage IV(intramuscular biceps femoris lesion)which puts him in the high intermediate risk group. CNS IPI score 4  Bone marrow biopsy showed involvement with low-grade B-cell lymphoproliferative disorder with no evidence ofHigh-grade B-cell lymphoma.  Patient received RCHOP for cycle 1 with plans for DA [facility] with cycle 2. IT MTX for CNS prophylaxis.  MRI brain negative for lymphoma  Interval history-patient feels well overall and is back to his baseline.  Denies any recurrent diarrhea.  Denies any specific complaints at this time.   ECOG PS- 1 Pain scale-0  Review of systems-  Review of Systems  Constitutional: Negative.  Negative for  chills, fever, malaise/fatigue and weight loss.  HENT: Negative for congestion, ear pain and tinnitus.   Eyes: Negative.  Negative for blurred vision and double vision.  Respiratory: Negative.  Negative for cough, sputum production and shortness of breath.   Cardiovascular: Negative.  Negative for chest pain, palpitations and leg swelling.  Gastrointestinal: Negative.  Negative for abdominal pain, constipation, diarrhea, nausea and vomiting.  Genitourinary: Negative for dysuria, frequency and urgency.  Musculoskeletal: Negative for back pain and falls.  Skin: Negative.  Negative for rash.  Neurological: Negative.  Negative for weakness and headaches.  Endo/Heme/Allergies: Negative.  Does not bruise/bleed easily.  Psychiatric/Behavioral: Negative.  Negative for depression. The patient is not nervous/anxious and does not have insomnia.         No Known Allergies   Past Medical History:  Diagnosis Date  . Acute kidney injury ([place]) 07/12/2020  . Dyspnea   . Hypertension   . 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 BONE MARROW BIOPSY    . COLONOSCOPY    . EXCISION MASS NECK Right 06/12/2020   Procedure: EXCISION MASS NECK;  Surgeon: Jules Husbands, MD;  Location: [facility] ORS;  Service: General;  Laterality: Right;  . HERNIA REPAIR Right    at age 56-RIH  . PORTA CATH INSERTION    . PORTACATH PLACEMENT Right 06/24/2020   Procedure: INSERTION PORT-A-CATH;  Surgeon: Jules Husbands, MD;  Location: [facility] ORS;  Service: General;  Laterality: Right;    Social History   Socioeconomic History  . Marital status: Single    Spouse name: Not on file  . Number of children:  Not on file  . Years of education: Not on file  . Highest education level: Not on file  Occupational History  . Not on file  Tobacco Use  . Smoking status: Former Smoker    Types: Cigarettes    Quit date: 06/13/2020    Years since quitting: 0.1  . Smokeless  tobacco: Never Used  Vaping Use  . Vaping Use: Never used  Substance and Sexual Activity  . Alcohol use: Not Currently  . Drug use: Not Currently    Types: Marijuana  . Sexual activity: Not Currently  Other Topics Concern  . Not on file  Social History Narrative   Has daughter and grandson in the home   Social Determinants of Health   Financial Resource Strain: Not on file  Food Insecurity: Not on file  Transportation Needs: Not on file  Physical Activity: Not on file  Stress: Not on file  Social Connections: Not on file  Intimate Partner Violence: Not on file    Family History  Problem Relation Age of Onset  . Anemia Mother   . Hypertension Mother   . Goiter Mother   . Cancer Father   . Cancer Sister   . Multiple sclerosis Sister      Current Outpatient Medications:  .  atenolol (TENORMIN) 25 MG tablet, Take 25 mg by mouth daily., Disp: , Rfl:  .  HYDROcodone-acetaminophen (NORCO/VICODIN) 5-325 MG tablet, Take 1 tablet by mouth every 6 (six) hours as needed for moderate pain., Disp: 15 tablet, Rfl: 0 .  lisinopril (ZESTRIL) 10 MG tablet, Take 10 mg by mouth daily., Disp: , Rfl:  .  VITAMIN D PO, Take 1 capsule by mouth daily., Disp: , Rfl:  .  aspirin EC 81 MG tablet, Take 81 mg by mouth daily as needed (heart health). Swallow whole. (Patient not taking: No sig reported), Disp: , Rfl:  .  predniSONE (DELTASONE) 50 MG tablet, Take 2 tablets each day with food early in am for 5 days while on chemo treatment, Disp: 10 tablet, Rfl: 0 .  Tenofovir Alafenamide Fumarate 25 MG TABS, Take 1 tablet (25 mg total) by mouth daily. (Patient not taking: No sig reported), Disp: 30 tablet, Rfl: 3 No current facility-administered medications for this visit.  Facility-Administered Medications Ordered in Other Visits:  .  0.9 %  sodium chloride infusion, , Intravenous, Continuous, Sindy Guadeloupe, MD, Last Rate: 20 mL/hr at 08/12/20 0947, New Bag at 08/12/20 0947 .  DOXOrubicin  (ADRIAMYCIN) 20 mg, etoposide (VEPESID) 102 mg, vinCRIStine (ONCOVIN) 0.8 mg in sodium chloride 0.9 % 500 mL chemo infusion, , Intravenous, Once, Sindy Guadeloupe, MD .  ondansetron (ZOFRAN) 8 mg, dexamethasone (DECADRON) 10 mg in sodium chloride 0.9 % 50 mL IVPB, , Intravenous, Once, Sindy Guadeloupe, MD  Physical exam:  Vitals:   08/12/20 0846  BP: 123/80  Pulse: 70  Resp: 16  Temp: (!) 97.1 F (36.2 C)  TempSrc: Tympanic  Weight: 180 lb 12.8 oz (82 kg)  Height: _0  (1.854 m)   Physical Exam Physical Exam Constitutional:      Appearance: Normal appearance.  HENT:     Head: Normocephalic and atraumatic.  Eyes:     Pupils: Pupils are equal, round, and reactive to light.  Cardiovascular:     Rate and Rhythm: Normal rate and regular rhythm.     Heart sounds: Normal heart sounds. No murmur heard.   Pulmonary:     Effort: Pulmonary effort is normal.  Breath sounds: Normal breath sounds. No wheezing.  Abdominal:     General: Bowel sounds are normal. There is no distension.     Palpations: Abdomen is soft.     Tenderness: There is no abdominal tenderness.  Musculoskeletal:        General: Normal range of motion.     Cervical back: Normal range of motion.  Skin:    General: Skin is warm and dry.     Findings: No rash.  Neurological:     Mental Status: He is alert and oriented to person, place, and time.  Psychiatric:        Judgment: Judgment normal.        CMP Latest Ref Rng & Units 08/12/2020  Glucose 70 - 99 mg/dL 132(H)  BUN 6 - 20 mg/dL 11  Creatinine 0.61 - 1.24 mg/dL 0.86  Sodium 135 - 145 mmol/L 135  Potassium 3.5 - 5.1 mmol/L 3.6  Chloride 98 - 111 mmol/L 102  CO2 22 - 32 mmol/L 23  Calcium 8.9 - 10.3 mg/dL 9.0  Total Protein 6.5 - 8.1 g/dL 6.9  Total Bilirubin 0.3 - 1.2 mg/dL 0.6  Alkaline Phos 38 - 126 U/L 94  AST 15 - 41 U/L 25  ALT 0 - 44 U/L 18   CBC Latest Ref Rng & Units 08/12/2020  WBC 4.0 - 10.5 K/uL 4.6  Hemoglobin 13.0 - 17.0 g/dL 12.4(L)   Hematocrit 39.0 - 52.0 % 36.8(L)  Platelets 150 - 400 K/uL 260    No images are attached to the encounter.  MR BRAIN W WO CONTRAST  Result Date: 07/15/2020 CLINICAL DATA:  Lymphoma, staging EXAM: MRI HEAD WITHOUT AND WITH CONTRAST TECHNIQUE: Multiplanar, multiecho pulse sequences of the brain and surrounding structures were obtained without and with intravenous contrast. CONTRAST:  91mL GADAVIST GADOBUTROL 1 MMOL/ML IV SOLN COMPARISON:  None. FINDINGS: Brain: There is no acute infarction or intracranial hemorrhage. There is no intracranial mass, mass effect, or edema. There is no hydrocephalus or extra-axial fluid collection. Ventricles and sulci are normal in size and configuration. Small focus of right subinsular T2 hyperintensity likely reflects nonspecific gliosis/demyelination. No abnormal enhancement. Vascular: Major vessel flow voids at the skull base are preserved. Skull and upper cervical spine: Normal marrow signal is preserved. Sinuses/Orbits: Minor mucosal thickening.  Orbits are unremarkable. Other: Sella is unremarkable.  Mastoid air cells are clear. IMPRESSION: No evidence of metastatic disease. Electronically Signed   By: Macy Mis M.D.   On: 07/15/2020 12:04    Assessment and plan- Patient is a 60 y.o. male  with stage IV triple hit high-grade B-cell lymphoma.  He is here for on treatment assessment prior to cycle 2 of dose adjusted R-EPOCH chemotherapy  Cycle 1 of chemotherapy was complicated by Salmonella enterocolitis requiring a brief hospitalization.  He did receive 1 for Neulasta with cycle 1 but did not receive etoposide.  His counts did turn back to baseline at 3 weeks but chemotherapy was delayed by 1 more week to allow him to recover from his enterocolitis completely.  He had follow-up last week but unfortunately continued to have some neutropenia with a WBC count of 2900 and ANC of 1100 .  Treatment was held once again.  Unclear of why his counts dropped given given he  had not had a chemo in greater than 5 weeks.  Today, patient presents back and feels well.  Denies any new complaints at this time.  Labs from 08/12/2020 show improvement of his wbc  count to 4.6 (2.9) and ANC is 2200 (1100).  All other labs are WNL.  Proceed with cycle 2 of dose adjusted R-EPOCH chemotherapy.   Per Dr. Elroy Channel notes, he will receive Rituxan today followed by etoposide and doxorubicin on day 2.  He will then receive etoposide on day 3 and day 4.  This will be given as a 24-hour infusion.  He will return to clinic on day 5 which will be a Saturday and will receive a cytoxin and on-pro neulasta which will be given at [facility].  He will take prednisone 100 mg from day 1 to day 5 along with OTC Claritin.  He will need biweekly labs on Tuesdays and Thursdays with CBC with differential until he see Dr. Janese Banks back on 09/01/2020 for cycle 3 dose adjusted R-EPOCH chemotherapy.  He will be scheduled for intrathecal methotrexate on 08/18/2020.    Plan is to reimage after cycle 3.  Greater than 50% was spent in counseling and coordination of care with this patient including but not limited to discussion of the relevant topics above (See A&P) including, but not limited to diagnosis and management of acute and chronic medical conditions.    Visit Diagnosis No diagnosis found.  Faythe Casa, NP 08/12/2020 11:21 AM

## 2020-08-12 NOTE — Progress Notes (Unsigned)
Did education on pump with patient. He signed consent & watched video. Advised pt to pickup prednisone rx and start today. Advised D1-5 - preferably in am, with food.

## 2020-08-12 NOTE — Progress Notes (Signed)
Pt is doing ok and counts are good for treatment

## 2020-08-13 ENCOUNTER — Other Ambulatory Visit: Payer: Self-pay

## 2020-08-13 ENCOUNTER — Inpatient Hospital Stay: Payer: Medicaid Other | Attending: Oncology

## 2020-08-13 ENCOUNTER — Ambulatory Visit: Payer: MEDICAID

## 2020-08-13 VITALS — BP 132/74 | HR 71 | Temp 97.2°F | Resp 18

## 2020-08-13 DIAGNOSIS — Z79899 Other long term (current) drug therapy: Secondary | ICD-10-CM | POA: Insufficient documentation

## 2020-08-13 DIAGNOSIS — Z5111 Encounter for antineoplastic chemotherapy: Secondary | ICD-10-CM | POA: Insufficient documentation

## 2020-08-13 DIAGNOSIS — R5383 Other fatigue: Secondary | ICD-10-CM | POA: Insufficient documentation

## 2020-08-13 DIAGNOSIS — C8510 Unspecified B-cell lymphoma, unspecified site: Secondary | ICD-10-CM

## 2020-08-13 DIAGNOSIS — R7402 Elevation of levels of lactic acid dehydrogenase (LDH): Secondary | ICD-10-CM | POA: Insufficient documentation

## 2020-08-13 DIAGNOSIS — Z87891 Personal history of nicotine dependence: Secondary | ICD-10-CM | POA: Insufficient documentation

## 2020-08-13 DIAGNOSIS — Z832 Family history of diseases of the blood and blood-forming organs and certain disorders involving the immune mechanism: Secondary | ICD-10-CM | POA: Insufficient documentation

## 2020-08-13 DIAGNOSIS — C8511 Unspecified B-cell lymphoma, lymph nodes of head, face, and neck: Secondary | ICD-10-CM | POA: Insufficient documentation

## 2020-08-13 DIAGNOSIS — Z8269 Family history of other diseases of the musculoskeletal system and connective tissue: Secondary | ICD-10-CM | POA: Insufficient documentation

## 2020-08-13 DIAGNOSIS — Z809 Family history of malignant neoplasm, unspecified: Secondary | ICD-10-CM | POA: Insufficient documentation

## 2020-08-13 DIAGNOSIS — Z8249 Family history of ischemic heart disease and other diseases of the circulatory system: Secondary | ICD-10-CM | POA: Insufficient documentation

## 2020-08-13 DIAGNOSIS — N179 Acute kidney failure, unspecified: Secondary | ICD-10-CM | POA: Insufficient documentation

## 2020-08-13 DIAGNOSIS — R9389 Abnormal findings on diagnostic imaging of other specified body structures: Secondary | ICD-10-CM | POA: Insufficient documentation

## 2020-08-13 MED ORDER — ONDANSETRON HCL 40 MG/20ML IJ SOLN
Freq: Once | INTRAMUSCULAR | Status: AC
Start: 2020-08-13 — End: 2020-08-13
  Administered 2020-08-13: 8 mg via INTRAVENOUS
  Filled 2020-08-13: qty 4

## 2020-08-13 MED ORDER — ETOPOSIDE CHEMO INJECTION 500 MG/25ML
Freq: Once | INTRAVENOUS | Status: AC
  Filled 2020-08-13: qty 10

## 2020-08-13 MED ORDER — HEPARIN SOD (PORK) LOCK FLUSH 100 UNIT/ML IV SOLN
500.0000 [IU] | Freq: Once | INTRAVENOUS | Status: AC
  Filled 2020-08-13: qty 5

## 2020-08-13 MED ORDER — SODIUM CHLORIDE 0.9% FLUSH
10.0000 mL | INTRAVENOUS | Status: DC | PRN
  Administered 2020-08-13 (×2): 10 mL via INTRAVENOUS
  Filled 2020-08-13: qty 10

## 2020-08-13 NOTE — Patient Instructions (Addendum)
[facility] ONCOLOGY    Discharge Instructions:  Thank you for choosing [facility] to provide your oncology and hematology care.  If you have a lab appointment with the [facility], please go directly to the [facility] and check in at the registration area.  Wear comfortable clothing and clothing appropriate for easy access to any Portacath or PICC line.   We strive to give you quality time with your provider. You may need to reschedule your appointment if you arrive late (15 or more minutes).  Arriving late affects you and other patients whose appointments are after yours.  Also, if you miss three or more appointments without notifying the office, you may be dismissed from the clinic at the provider's discretion.      For prescription refill requests, have your pharmacy contact our office and allow 72 hours for refills to be completed.    Today you received the following chemotherapy and/or immunotherapy agents: Adriamycin, Etoposide, Vincristine 24-hour Continuous Infusion Pump connected.      To help prevent nausea and vomiting after your treatment, we encourage you to take your nausea medication as directed.  BELOW ARE SYMPTOMS THAT SHOULD BE REPORTED IMMEDIATELY: . *FEVER GREATER THAN 100.4 F (38 C) OR HIGHER . *CHILLS OR SWEATING . *NAUSEA AND VOMITING THAT IS NOT CONTROLLED WITH YOUR NAUSEA MEDICATION . *UNUSUAL SHORTNESS OF BREATH . *UNUSUAL BRUISING OR BLEEDING . *URINARY PROBLEMS (pain or burning when urinating, or frequent urination) . *BOWEL PROBLEMS (unusual diarrhea, constipation, pain near the anus) . TENDERNESS IN MOUTH AND THROAT WITH OR WITHOUT PRESENCE OF ULCERS (sore throat, sores in mouth, or a toothache) . UNUSUAL RASH, SWELLING OR PAIN  . UNUSUAL VAGINAL DISCHARGE OR ITCHING   Items with * indicate a potential emergency and should be followed up as soon as possible or go to the Emergency Department if any problems  should occur.  Please show the CHEMOTHERAPY ALERT CARD or IMMUNOTHERAPY ALERT CARD at check-in to the Emergency Department and triage nurse.  Should you have questions after your visit or need to cancel or reschedule your appointment, please contact [facility]  [phone] and follow the prompts.  Office hours are 8:00 a.m. to 4:30 p.m. Monday - Friday. Please note that voicemails left after 4:00 p.m. may not be returned until the following business day.  We are closed weekends and major holidays. You have access to a nurse at all times for urgent questions. Please call the main number to the clinic [phone] and follow the prompts.  For any non-urgent questions, you may also contact your provider using MyChart. We now offer e-Visits for anyone 63 and older to request care online for non-urgent symptoms. For details visit [email or web address].   Also download the MyChart app! Go to the app store, search "MyChart", open the app, select [facility], and log in with your MyChart username and password.  Due to Covid, a mask is required upon entering the hospital/clinic. If you do not have a mask, one will be given to you upon arrival. For doctor visits, patients may have 1 support person aged 24 or older with them. For treatment visits, patients cannot have anyone with them due to current Covid guidelines and our immunocompromised population.   Vincristine injection  What is this medicine? VINCRISTINE (vin KRIS teen) is a chemotherapy drug. It slows the growth of cancer cells. This medicine is used to treat many types of cancer like Hodgkin's disease,  leukemia, non-Hodgkin's lymphoma, neuroblastoma (brain cancer), rhabdomyosarcoma, and Wilms' tumor. This medicine may be used for other purposes; ask your health care provider or pharmacist if you have questions. COMMON BRAND NAME(S): Oncovin, Vincasar PFS What should I tell my health care provider before I take  this medicine? They need to know if you have any of these conditions:  blood disorders  gout  infection (especially chickenpox, cold sores, or herpes)  kidney disease  liver disease  lung disease  nervous system disease like Charcot-Marie-Tooth (CMT)  recent or ongoing radiation therapy  an unusual or allergic reaction to vincristine, other chemotherapy agents, other medicines, foods, dyes, or preservatives  pregnant or trying to get pregnant  breast-feeding How should I use this medicine? This drug is given as an infusion into a vein. It is administered in a hospital or clinic by a specially trained health care professional. If you have pain, swelling, burning, or any unusual feeling around the site of your injection, tell your health care professional right away. Talk to your pediatrician regarding the use of this medicine in children. While this drug may be prescribed for selected conditions, precautions do apply. Overdosage: If you think you have taken too much of this medicine contact a poison control center or emergency room at once. NOTE: This medicine is only for you. Do not share this medicine with others. What if I miss a dose? It is important not to miss your dose. Call your doctor or health care professional if you are unable to keep an appointment. What may interact with this medicine?  certain medicines for fungal infections like itraconazole, ketoconazole, posaconazole, voriconazole  certain medicines for seizures like phenytoin This list may not describe all possible interactions. Give your health care provider a list of all the medicines, herbs, non-prescription drugs, or dietary supplements you use. Also tell them if you smoke, drink alcohol, or use illegal drugs. Some items may interact with your medicine. What should I watch for while using this medicine? This drug may make you feel generally unwell. This is not uncommon, as chemotherapy can affect healthy  cells as well as cancer cells. Report any side effects. Continue your course of treatment even though you feel ill unless your doctor tells you to stop. You may need blood work done while you are taking this medicine. This medicine will cause constipation. Try to have a bowel movement at least every 2 to 3 days. If you do not have a bowel movement for 3 days, call your doctor or health care professional. In some cases, you may be given additional medicines to help with side effects. Follow all directions for their use. Do not become pregnant while taking this medicine. Women should inform their doctor if they wish to become pregnant or think they might be pregnant. There is a potential for serious side effects to an unborn child. Talk to your health care professional or pharmacist for more information. Do not breast-feed an infant while taking this medicine. This medicine may make it more difficult to get pregnant or to father a child. Talk to your healthcare professional if you are concerned about your fertility. What side effects may I notice from receiving this medicine? Side effects that you should report to your doctor or health care professional as soon as possible:  allergic reactions like skin rash, itching or hives, swelling of the face, lips, or tongue  breathing problems  confusion or changes in emotions or moods  constipation  cough  mouth  sores  muscle weakness  nausea and vomiting  pain, swelling, redness or irritation at the injection site  pain, tingling, numbness in the hands or feet  problems with balance, talking, walking  seizures  stomach pain  trouble passing urine or change in the amount of urine Side effects that usually do not require medical attention (report to your doctor or health care professional if they continue or are bothersome):  diarrhea  hair loss  jaw pain  loss of appetite This list may not describe all possible side effects. Call  your doctor for medical advice about side effects. You may report side effects to FDA at 1-800-FDA-1088. Where should I keep my medicine? This drug is given in a hospital or clinic and will not be stored at home. NOTE: This sheet is a summary. It may not cover all possible information. If you have questions about this medicine, talk to your doctor, pharmacist, or health care provider.  2021 Elsevier/Gold Standard (2019-01-30 17:05:13)  Etoposide, VP-16 injection  What is this medicine? ETOPOSIDE, VP-16 (e toe POE side) is a chemotherapy drug. It is used to treat testicular cancer, lung cancer, and other cancers. This medicine may be used for other purposes; ask your health care provider or pharmacist if you have questions. COMMON BRAND NAME(S): Etopophos, Toposar, VePesid What should I tell my health care provider before I take this medicine? They need to know if you have any of these conditions:  infection  kidney disease  liver disease  low blood counts, like low white cell, platelet, or red cell counts  an unusual or allergic reaction to etoposide, other medicines, foods, dyes, or preservatives  pregnant or trying to get pregnant  breast-feeding How should I use this medicine? This medicine is for infusion into a vein. It is administered in a hospital or clinic by a specially trained health care professional. Talk to your pediatrician regarding the use of this medicine in children. Special care may be needed. Overdosage: If you think you have taken too much of this medicine contact a poison control center or emergency room at once. NOTE: This medicine is only for you. Do not share this medicine with others. What if I miss a dose? It is important not to miss your dose. Call your doctor or health care professional if you are unable to keep an appointment. What may interact with this medicine? This medicine may interact with the following medications:  warfarin This list may  not describe all possible interactions. Give your health care provider a list of all the medicines, herbs, non-prescription drugs, or dietary supplements you use. Also tell them if you smoke, drink alcohol, or use illegal drugs. Some items may interact with your medicine. What should I watch for while using this medicine? Visit your doctor for checks on your progress. This drug may make you feel generally unwell. This is not uncommon, as chemotherapy can affect healthy cells as well as cancer cells. Report any side effects. Continue your course of treatment even though you feel ill unless your doctor tells you to stop. In some cases, you may be given additional medicines to help with side effects. Follow all directions for their use. Call your doctor or health care professional for advice if you get a fever, chills or sore throat, or other symptoms of a cold or flu. Do not treat yourself. This drug decreases your body's ability to fight infections. Try to avoid being around people who are sick. This medicine may increase  your risk to bruise or bleed. Call your doctor or health care professional if you notice any unusual bleeding. Talk to your doctor about your risk of cancer. You may be more at risk for certain types of cancers if you take this medicine. Do not become pregnant while taking this medicine or for at least 6 months after stopping it. Women should inform their doctor if they wish to become pregnant or think they might be pregnant. Women of child-bearing potential will need to have a negative pregnancy test before starting this medicine. There is a potential for serious side effects to an unborn child. Talk to your health care professional or pharmacist for more information. Do not breast-feed an infant while taking this medicine. Men must use a latex condom during sexual contact with a woman while taking this medicine and for at least 4 months after stopping it. A latex condom is needed even if  you have had a vasectomy. Contact your doctor right away if your partner becomes pregnant. Do not donate sperm while taking this medicine and for at least 4 months after you stop taking this medicine. Men should inform their doctors if they wish to father a child. This medicine may lower sperm counts. What side effects may I notice from receiving this medicine? Side effects that you should report to your doctor or health care professional as soon as possible:  allergic reactions like skin rash, itching or hives, swelling of the face, lips, or tongue  low blood counts - this medicine may decrease the number of white blood cells, red blood cells, and platelets. You may be at increased risk for infections and bleeding  nausea, vomiting  redness, blistering, peeling or loosening of the skin, including inside the mouth  signs and symptoms of infection like fever; chills; cough; sore throat; pain or trouble passing urine  signs and symptoms of low red blood cells or anemia such as unusually weak or tired; feeling faint or lightheaded; falls; breathing problems  unusual bruising or bleeding Side effects that usually do not require medical attention (report to your doctor or health care professional if they continue or are bothersome):  changes in taste  diarrhea  hair loss  loss of appetite  mouth sores This list may not describe all possible side effects. Call your doctor for medical advice about side effects. You may report side effects to FDA at 1-800-FDA-1088. Where should I keep my medicine? This drug is given in a hospital or clinic and will not be stored at home. NOTE: This sheet is a summary. It may not cover all possible information. If you have questions about this medicine, talk to your doctor, pharmacist, or health care provider.  2021 Elsevier/Gold Standard (2018-04-26 16:57:15)  Doxorubicin injection  What is this medicine? DOXORUBICIN (dox oh ROO bi sin) is a  chemotherapy drug. It is used to treat many kinds of cancer like leukemia, lymphoma, neuroblastoma, sarcoma, and Wilms' tumor. It is also used to treat bladder cancer, breast cancer, lung cancer, ovarian cancer, stomach cancer, and thyroid cancer. This medicine may be used for other purposes; ask your health care provider or pharmacist if you have questions. COMMON BRAND NAME(S): Adriamycin, Adriamycin PFS, Adriamycin RDF, Rubex What should I tell my health care provider before I take this medicine? They need to know if you have any of these conditions:  heart disease  history of low blood counts caused by a medicine  liver disease  recent or ongoing radiation therapy  an unusual or allergic reaction to doxorubicin, other chemotherapy agents, other medicines, foods, dyes, or preservatives  pregnant or trying to get pregnant  breast-feeding How should I use this medicine? This drug is given as an infusion into a vein. It is administered in a hospital or clinic by a specially trained health care professional. If you have pain, swelling, burning or any unusual feeling around the site of your injection, tell your health care professional right away. Talk to your pediatrician regarding the use of this medicine in children. Special care may be needed. Overdosage: If you think you have taken too much of this medicine contact a poison control center or emergency room at once. NOTE: This medicine is only for you. Do not share this medicine with others. What if I miss a dose? It is important not to miss your dose. Call your doctor or health care professional if you are unable to keep an appointment. What may interact with this medicine? This medicine may interact with the following medications:  6-mercaptopurine  paclitaxel  phenytoin  St. John's Wort  trastuzumab  verapamil This list may not describe all possible interactions. Give your health care provider a list of all the medicines,  herbs, non-prescription drugs, or dietary supplements you use. Also tell them if you smoke, drink alcohol, or use illegal drugs. Some items may interact with your medicine. What should I watch for while using this medicine? This drug may make you feel generally unwell. This is not uncommon, as chemotherapy can affect healthy cells as well as cancer cells. Report any side effects. Continue your course of treatment even though you feel ill unless your doctor tells you to stop. There is a maximum amount of this medicine you should receive throughout your life. The amount depends on the medical condition being treated and your overall health. Your doctor will watch how much of this medicine you receive in your lifetime. Tell your doctor if you have taken this medicine before. You may need blood work done while you are taking this medicine. Your urine may turn red for a few days after your dose. This is not blood. If your urine is dark or brown, call your doctor. In some cases, you may be given additional medicines to help with side effects. Follow all directions for their use. Call your doctor or health care professional for advice if you get a fever, chills or sore throat, or other symptoms of a cold or flu. Do not treat yourself. This drug decreases your body's ability to fight infections. Try to avoid being around people who are sick. This medicine may increase your risk to bruise or bleed. Call your doctor or health care professional if you notice any unusual bleeding. Talk to your doctor about your risk of cancer. You may be more at risk for certain types of cancers if you take this medicine. Do not become pregnant while taking this medicine or for 6 months after stopping it. Women should inform their doctor if they wish to become pregnant or think they might be pregnant. Men should not father a child while taking this medicine and for 6 months after stopping it. There is a potential for serious side  effects to an unborn child. Talk to your health care professional or pharmacist for more information. Do not breast-feed an infant while taking this medicine. This medicine has caused ovarian failure in some women and reduced sperm counts in some men This medicine may interfere with the ability to  have a child. Talk with your doctor or health care professional if you are concerned about your fertility. This medicine may cause a decrease in Co-Enzyme Q-10. You should make sure that you get enough Co-Enzyme Q-10 while you are taking this medicine. Discuss the foods you eat and the vitamins you take with your health care professional. What side effects may I notice from receiving this medicine? Side effects that you should report to your doctor or health care professional as soon as possible:  allergic reactions like skin rash, itching or hives, swelling of the face, lips, or tongue  breathing problems  chest pain  fast or irregular heartbeat  low blood counts - this medicine may decrease the number of white blood cells, red blood cells and platelets. You may be at increased risk for infections and bleeding.  pain, redness, or irritation at site where injected  signs of infection - fever or chills, cough, sore throat, pain or difficulty passing urine  signs of decreased platelets or bleeding - bruising, pinpoint red spots on the skin, black, tarry stools, blood in the urine  swelling of the ankles, feet, hands  tiredness  weakness Side effects that usually do not require medical attention (report to your doctor or health care professional if they continue or are bothersome):  diarrhea  hair loss  mouth sores  nail discoloration or damage  nausea  red colored urine  vomiting This list may not describe all possible side effects. Call your doctor for medical advice about side effects. You may report side effects to FDA at 1-800-FDA-1088. Where should I keep my medicine? This  drug is given in a hospital or clinic and will not be stored at home. NOTE: This sheet is a summary. It may not cover all possible information. If you have questions about this medicine, talk to your doctor, pharmacist, or health care provider.  2021 Elsevier/Gold Standard (2016-10-13 11:01:26)

## 2020-08-14 ENCOUNTER — Inpatient Hospital Stay: Payer: Medicaid Other

## 2020-08-14 VITALS — BP 136/72 | HR 77 | Temp 97.0°F | Resp 18

## 2020-08-14 DIAGNOSIS — C8510 Unspecified B-cell lymphoma, unspecified site: Secondary | ICD-10-CM

## 2020-08-14 MED ORDER — VINCRISTINE SULFATE CHEMO INJECTION 1 MG/ML
Freq: Once | INTRAVENOUS | Status: DC
  Filled 2020-08-14: qty 10

## 2020-08-14 MED ORDER — SODIUM CHLORIDE 0.9 % IV SOLN
Freq: Once | INTRAVENOUS | Status: AC
Start: 2020-08-14 — End: 2020-08-14
  Filled 2020-08-14: qty 250

## 2020-08-14 MED ORDER — VINCRISTINE SULFATE CHEMO INJECTION 1 MG/ML
Freq: Once | INTRAVENOUS | Status: AC
  Filled 2020-08-14: qty 10

## 2020-08-14 MED ORDER — DEXAMETHASONE SODIUM PHOSPHATE 10 MG/ML IJ SOLN
Freq: Once | INTRAMUSCULAR | Status: AC
Start: 2020-08-14 — End: 2020-08-14
  Administered 2020-08-14: 10 mg via INTRAVENOUS
  Filled 2020-08-14: qty 4

## 2020-08-14 NOTE — Progress Notes (Signed)
1515: Adriamycin/Vincristine/Etoposide pump removed after infusing for 24 hours as ordered.  Approx 50cc of overfill remaining in bag, at this time medication is expired. [facility] Pharmacy team made aware. Medication disposed of properly.

## 2020-08-14 NOTE — Patient Instructions (Signed)
[facility] ONCOLOGY  Discharge Instructions: Thank you for choosing [facility] to provide your oncology and hematology care.  If you have a lab appointment with the [facility], please go directly to the [facility] and check in at the registration area.  Wear comfortable clothing and clothing appropriate for easy access to any Portacath or PICC line.   We strive to give you quality time with your provider. You may need to reschedule your appointment if you arrive late (15 or more minutes).  Arriving late affects you and other patients whose appointments are after yours.  Also, if you miss three or more appointments without notifying the office, you may be dismissed from the clinic at the provider's discretion.      For prescription refill requests, have your pharmacy contact our office and allow 72 hours for refills to be completed.    Today you received the following chemotherapy and/or immunotherapy agents Adriamycin, Vincristine and Etoposide      To help prevent nausea and vomiting after your treatment, we encourage you to take your nausea medication as directed.  BELOW ARE SYMPTOMS THAT SHOULD BE REPORTED IMMEDIATELY: . *FEVER GREATER THAN 100.4 F (38 C) OR HIGHER . *CHILLS OR SWEATING . *NAUSEA AND VOMITING THAT IS NOT CONTROLLED WITH YOUR NAUSEA MEDICATION . *UNUSUAL SHORTNESS OF BREATH . *UNUSUAL BRUISING OR BLEEDING . *URINARY PROBLEMS (pain or burning when urinating, or frequent urination) . *BOWEL PROBLEMS (unusual diarrhea, constipation, pain near the anus) . TENDERNESS IN MOUTH AND THROAT WITH OR WITHOUT PRESENCE OF ULCERS (sore throat, sores in mouth, or a toothache) . UNUSUAL RASH, SWELLING OR PAIN  . UNUSUAL VAGINAL DISCHARGE OR ITCHING   Items with * indicate a potential emergency and should be followed up as soon as possible or go to the Emergency Department if any problems should occur.  Please show the CHEMOTHERAPY  ALERT CARD or IMMUNOTHERAPY ALERT CARD at check-in to the Emergency Department and triage nurse.  Should you have questions after your visit or need to cancel or reschedule your appointment, please contact [facility]  [phone] and follow the prompts.  Office hours are 8:00 a.m. to 4:30 p.m. Monday - Friday. Please note that voicemails left after 4:00 p.m. may not be returned until the following business day.  We are closed weekends and major holidays. You have access to a nurse at all times for urgent questions. Please call the main number to the clinic [phone] and follow the prompts.  For any non-urgent questions, you may also contact your provider using MyChart. We now offer e-Visits for anyone 74 and older to request care online for non-urgent symptoms. For details visit [email or web address].   Also download the MyChart app! Go to the app store, search "MyChart", open the app, select [facility], and log in with your MyChart username and password.  Due to Covid, a mask is required upon entering the hospital/clinic. If you do not have a mask, one will be given to you upon arrival. For doctor visits, patients may have 1 support person aged 16 or older with them. For treatment visits, patients cannot have anyone with them due to current Covid guidelines and our immunocompromised population.

## 2020-08-15 ENCOUNTER — Encounter: Payer: Self-pay | Admitting: Oncology

## 2020-08-15 ENCOUNTER — Inpatient Hospital Stay: Payer: Medicaid Other

## 2020-08-15 VITALS — BP 123/62 | HR 68 | Temp 97.0°F | Resp 18

## 2020-08-15 DIAGNOSIS — C8510 Unspecified B-cell lymphoma, unspecified site: Secondary | ICD-10-CM

## 2020-08-15 MED ORDER — SODIUM CHLORIDE 0.9 % IV SOLN
Freq: Once | INTRAVENOUS | Status: AC
  Administered 2020-08-15: 8 mg via INTRAVENOUS
  Filled 2020-08-15: qty 4

## 2020-08-15 MED ORDER — SODIUM CHLORIDE 0.9 % IV SOLN
Freq: Once | INTRAVENOUS | Status: AC
  Filled 2020-08-15: qty 250

## 2020-08-15 MED ORDER — VINCRISTINE SULFATE CHEMO INJECTION 1 MG/ML
Freq: Once | INTRAVENOUS | Status: AC
  Filled 2020-08-15: qty 10

## 2020-08-16 ENCOUNTER — Inpatient Hospital Stay: Payer: Self-pay | Attending: Oncology

## 2020-08-16 ENCOUNTER — Other Ambulatory Visit: Payer: Self-pay

## 2020-08-16 VITALS — BP 137/75 | HR 68 | Temp 97.8°F | Resp 18

## 2020-08-16 DIAGNOSIS — C8510 Unspecified B-cell lymphoma, unspecified site: Secondary | ICD-10-CM | POA: Insufficient documentation

## 2020-08-16 DIAGNOSIS — Z5111 Encounter for antineoplastic chemotherapy: Secondary | ICD-10-CM | POA: Insufficient documentation

## 2020-08-16 MED ORDER — SODIUM CHLORIDE 0.9 % IV SOLN
INTRAVENOUS | Status: DC
  Filled 2020-08-16: qty 250

## 2020-08-16 MED ORDER — SODIUM CHLORIDE 0.9% FLUSH
10.0000 mL | INTRAVENOUS | Status: AC | PRN
Start: 2020-08-16 — End: 2020-08-16
  Administered 2020-08-16: 10 mL
  Filled 2020-08-16: qty 10

## 2020-08-16 MED ORDER — PEGFILGRASTIM 6 MG/0.6ML ~~LOC~~ PSKT
6.0000 mg | PREFILLED_SYRINGE | Freq: Once | SUBCUTANEOUS | Status: AC
  Administered 2020-08-16: 6 mg via SUBCUTANEOUS

## 2020-08-16 MED ORDER — SODIUM CHLORIDE 0.9 % IV SOLN
750.0000 mg/m2 | Freq: Once | INTRAVENOUS | Status: AC
  Administered 2020-08-16: 1520 mg via INTRAVENOUS
  Filled 2020-08-16: qty 76

## 2020-08-16 MED ORDER — HEPARIN SOD (PORK) LOCK FLUSH 100 UNIT/ML IV SOLN
500.0000 [IU] | INTRAVENOUS | Status: AC | PRN
  Administered 2020-08-16: 500 [IU]
  Filled 2020-08-16: qty 5

## 2020-08-16 MED ORDER — SODIUM CHLORIDE 0.9 % IV SOLN
Freq: Once | INTRAVENOUS | Status: AC
  Administered 2020-08-16: 16 mg via INTRAVENOUS
  Filled 2020-08-16: qty 8

## 2020-08-16 NOTE — Patient Instructions (Signed)
[facility]  Discharge Instructions: Thank you for choosing [facility] to provide your oncology and hematology care.   If you have a lab appointment with the [facility], please go directly to the [facility] and check in at the registration area.   Wear comfortable clothing and clothing appropriate for easy access to any Portacath or PICC line.   We strive to give you quality time with your provider. You may need to reschedule your appointment if you arrive late (15 or more minutes).  Arriving late affects you and other patients whose appointments are after yours.  Also, if you miss three or more appointments without notifying the office, you may be dismissed from the clinic at the provider's discretion.      For prescription refill requests, have your pharmacy contact our office and allow 72 hours for refills to be completed.    Today you received the following chemotherapy and/or immunotherapy agents cytoxin   To help prevent nausea and vomiting after your treatment, we encourage you to take your nausea medication as directed.  BELOW ARE SYMPTOMS THAT SHOULD BE REPORTED IMMEDIATELY: . *FEVER GREATER THAN 100.4 F (38 C) OR HIGHER . *CHILLS OR SWEATING . *NAUSEA AND VOMITING THAT IS NOT CONTROLLED WITH YOUR NAUSEA MEDICATION . *UNUSUAL SHORTNESS OF BREATH . *UNUSUAL BRUISING OR BLEEDING . *URINARY PROBLEMS (pain or burning when urinating, or frequent urination) . *BOWEL PROBLEMS (unusual diarrhea, constipation, pain near the anus) . TENDERNESS IN MOUTH AND THROAT WITH OR WITHOUT PRESENCE OF ULCERS (sore throat, sores in mouth, or a toothache) . UNUSUAL RASH, SWELLING OR PAIN  . UNUSUAL VAGINAL DISCHARGE OR ITCHING   Items with * indicate a potential emergency and should be followed up as soon as possible or go to the Emergency Department if any problems should occur.  Please show the CHEMOTHERAPY ALERT CARD or IMMUNOTHERAPY ALERT CARD  at check-in to the Emergency Department and triage nurse.  Should you have questions after your visit or need to cancel or reschedule your appointment, please contact [facility]  Dept: [phone]  and follow the prompts.  Office hours are 8:00 a.m. to 4:30 p.m. Monday - Friday. Please note that voicemails left after 4:00 p.m. may not be returned until the following business day.  We are closed weekends and major holidays. You have access to a nurse at all times for urgent questions. Please call the main number to the clinic Dept: [phone] and follow the prompts.   For any non-urgent questions, you may also contact your provider using MyChart. We now offer e-Visits for anyone 20 and older to request care online for non-urgent symptoms. For details visit [email or web address].   Also download the MyChart app! Go to the app store, search "MyChart", open the app, select [facility], and log in with your MyChart username and password.  Due to Covid, a mask is required upon entering the hospital/clinic. If you do not have a mask, one will be given to you upon arrival. For doctor visits, patients may have 1 support person aged 4 or older with them. For treatment visits, patients cannot have anyone with them due to current Covid guidelines and our immunocompromised population.

## 2020-08-18 ENCOUNTER — Ambulatory Visit
Admission: RE | Admit: 2020-08-18 | Discharge: 2020-08-18 | Disposition: A | Discharge status: Home / Self Care | Payer: Self-pay | Source: Ambulatory Visit | Attending: Oncology | Admitting: Oncology

## 2020-08-18 ENCOUNTER — Other Ambulatory Visit: Payer: Self-pay

## 2020-08-18 ENCOUNTER — Inpatient Hospital Stay: Payer: Medicaid Other

## 2020-08-18 DIAGNOSIS — C8510 Unspecified B-cell lymphoma, unspecified site: Secondary | ICD-10-CM | POA: Insufficient documentation

## 2020-08-18 IMAGING — RF DG FLUORO GUIDE SPINAL/SI JT INJ*L*
1 series · 1 of 1 positions shown · non-contrast
Comparison: Previous exams.

CLINICAL DATA: Patient presents for intrathecal methotrexate
injection due to history of high-grade B-cell lymphoma.

EXAM:
DIAGNOSTIC LUMBAR PUNCTURE UNDER FLUOROSCOPIC GUIDANCE

[Series 2: cp_standard · 0.17mm/px · 1 of 1 slices shown]
[im 1/1]
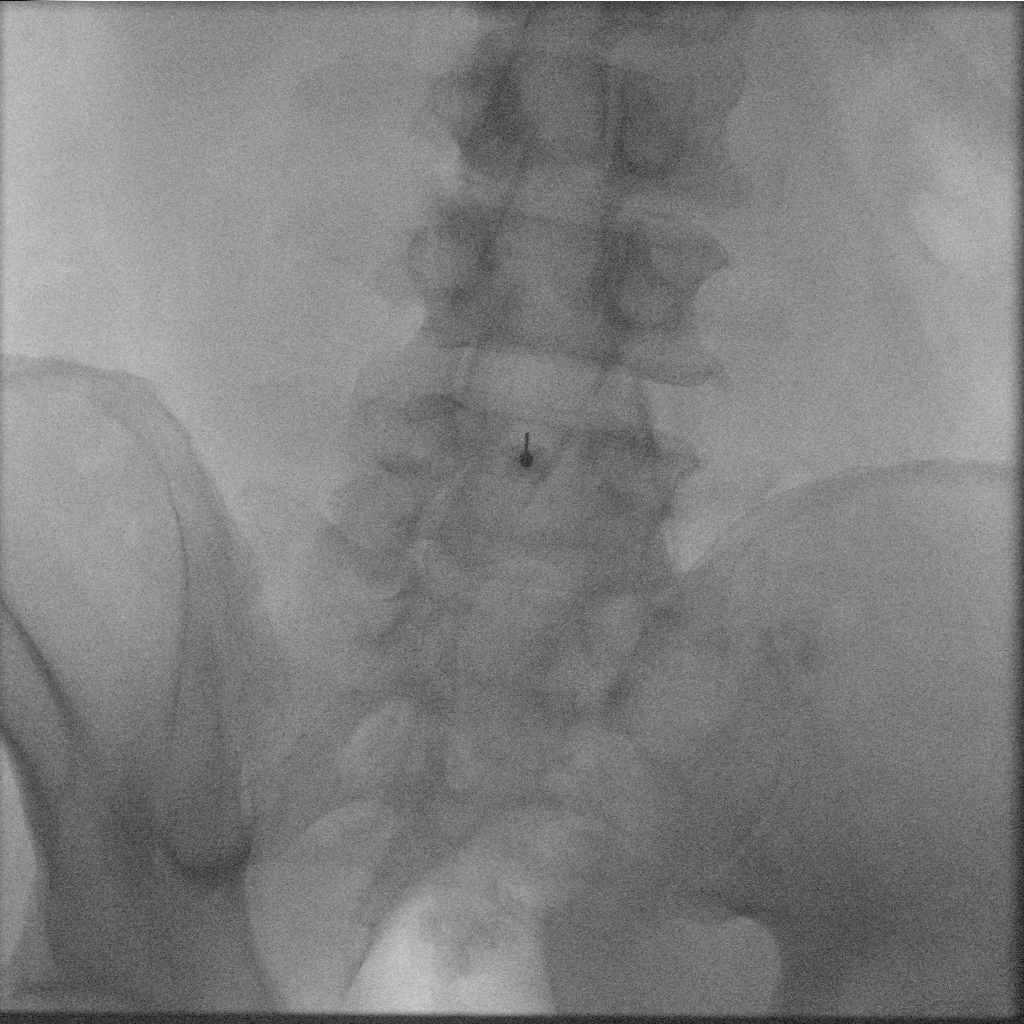

[1 of 1 positions shown; findings below may reference images not displayed]

FLUOROSCOPY TIME:  Fluoroscopy Time:  5 minutes 3 seconds

Radiation Exposure Index (if provided by the fluoroscopic device):
108 mGy

Number of Acquired Spot Images: 1

PROCEDURE:
Informed consent was obtained from the patient prior to the
procedure, including potential complications of headache, allergy,
infection, bleeding and pain as well as possible methotrexate
related complications including seizure neurotoxicity. With the
patient prone, the lower back was prepped with Betadine. 1%
Lidocaine was used for local anesthesia. After failed attempt at the
L4-5 level, lumbar puncture was successfully performed at the L5-S1
level using a 10 cm 22 gauge needle with return of clear CSF.
Subsequently, 12 mg of methotrexate in 5 mL was injected slowly
followed by sterile saline flush. The patient tolerated the
procedure well and there were no apparent complications.
IMPRESSION: Successful fluoroscopic guided lumbar puncture at the L5-S1 level
for intrathecal injection of 12 mg methotrexate.

## 2020-08-18 MED ORDER — SODIUM CHLORIDE (PF) 0.9 % IJ SOLN
Freq: Once | INTRAMUSCULAR | Status: DC
  Filled 2020-08-18: qty 0.48

## 2020-08-18 MED ORDER — SODIUM CHLORIDE (PF) 0.9 % IJ SOLN
Freq: Once | INTRAMUSCULAR | Status: AC
  Filled 2020-08-18: qty 0.48

## 2020-08-18 MED ORDER — LIDOCAINE HCL (PF) 1 % IJ SOLN
10.0000 mL | Freq: Once | INTRAMUSCULAR | Status: AC
  Administered 2020-08-18: 10 mL
  Filled 2020-08-18: qty 10

## 2020-08-18 NOTE — Progress Notes (Signed)
Patient taking to same-day [facility]. Spoke with RN Jenny Reichmann.

## 2020-08-21 ENCOUNTER — Telehealth: Payer: Self-pay | Admitting: *Deleted

## 2020-08-21 NOTE — Telephone Encounter (Signed)
Pt called and said that he was hoarse. It started last night and it is worse today. He has no swallowing problems, he has no fever. He does not have a runny nose or drainage in his throat. I checked with Josh- NP and rec: that pt drink herbal tea or apple cider warm. To see if that helps. He does not have sores in his mouth and no pain at all. He will watch it over the day and if he needs anything he will call back

## 2020-09-01 ENCOUNTER — Encounter: Payer: Self-pay | Admitting: Oncology

## 2020-09-01 ENCOUNTER — Other Ambulatory Visit: Payer: Self-pay | Admitting: *Deleted

## 2020-09-01 DIAGNOSIS — C8510 Unspecified B-cell lymphoma, unspecified site: Secondary | ICD-10-CM

## 2020-09-05 ENCOUNTER — Telehealth: Payer: Self-pay | Admitting: *Deleted

## 2020-09-05 NOTE — Telephone Encounter (Signed)
Called the pt. And let him know that we have scheduled his intrathecal inj. On wed. 7/6. Arrive at [facility] and register at admission desk. Then he will go to the radiology dept. To get the injection and stay there for 4 hours to make sure he is doing well. I spoke to Janese Banks about the pt. Was stuck twice last time and had a HA and numbness and tingling in his legs for several hours. I checked with IR and  Dr. Posey Pronto will be there on wed. And he is the head of fluoroscopy and he will take care of the patient.  Patient told not to eat anything on that day until after he has had procedure. Also there is a an appt at cancer center for injection and that is just to let our pharmacy know to mix the meththotrexate. Please do not come to cancer center. Arrive at medical mall at 8:30. He is agreeable to this

## 2020-09-08 ENCOUNTER — Inpatient Hospital Stay: Payer: Medicaid Other

## 2020-09-08 ENCOUNTER — Inpatient Hospital Stay (HOSPITAL_BASED_OUTPATIENT_CLINIC_OR_DEPARTMENT_OTHER): Payer: Medicaid Other | Admitting: Oncology

## 2020-09-08 ENCOUNTER — Encounter: Payer: Self-pay | Admitting: Oncology

## 2020-09-08 ENCOUNTER — Other Ambulatory Visit: Payer: Self-pay

## 2020-09-08 VITALS — BP 123/85 | HR 67 | Temp 96.0°F | Resp 18 | Wt 181.0 lb

## 2020-09-08 VITALS — BP 135/84 | HR 57

## 2020-09-08 DIAGNOSIS — Z5111 Encounter for antineoplastic chemotherapy: Secondary | ICD-10-CM

## 2020-09-08 DIAGNOSIS — Z79620 Long term (current) use of immunosuppressive biologic: Secondary | ICD-10-CM

## 2020-09-08 DIAGNOSIS — Z5181 Encounter for therapeutic drug level monitoring: Secondary | ICD-10-CM

## 2020-09-08 DIAGNOSIS — C8510 Unspecified B-cell lymphoma, unspecified site: Secondary | ICD-10-CM

## 2020-09-08 DIAGNOSIS — Z79899 Other long term (current) drug therapy: Secondary | ICD-10-CM

## 2020-09-08 LAB — CBC WITH DIFFERENTIAL/PLATELET
Abs Immature Granulocytes: 0.03 10*3/uL (ref 0.00–0.07)
Basophils Absolute: 0 10*3/uL (ref 0.0–0.1)
Basophils Relative: 1 %
Eosinophils Absolute: 0 10*3/uL (ref 0.0–0.5)
Eosinophils Relative: 1 %
HCT: 33.7 % — ABNORMAL LOW (ref 39.0–52.0)
Hemoglobin: 11.6 g/dL — ABNORMAL LOW (ref 13.0–17.0)
Immature Granulocytes: 1 %
Lymphocytes Relative: 40 %
Lymphs Abs: 1.8 10*3/uL (ref 0.7–4.0)
MCH: 31.4 pg (ref 26.0–34.0)
MCHC: 34.4 g/dL (ref 30.0–36.0)
MCV: 91.1 fL (ref 80.0–100.0)
Monocytes Absolute: 0.6 10*3/uL (ref 0.1–1.0)
Monocytes Relative: 14 %
Neutro Abs: 1.9 10*3/uL (ref 1.7–7.7)
Neutrophils Relative %: 43 %
Platelets: 342 10*3/uL (ref 150–400)
RBC: 3.7 MIL/uL — ABNORMAL LOW (ref 4.22–5.81)
RDW: 16.8 % — ABNORMAL HIGH (ref 11.5–15.5)
WBC: 4.4 10*3/uL (ref 4.0–10.5)
nRBC: 0 % (ref 0.0–0.2)

## 2020-09-08 LAB — COMPREHENSIVE METABOLIC PANEL
ALT: 16 U/L (ref 0–44)
AST: 19 U/L (ref 15–41)
Albumin: 3.9 g/dL (ref 3.5–5.0)
Alkaline Phosphatase: 78 U/L (ref 38–126)
Anion gap: 7 (ref 5–15)
BUN: 15 mg/dL (ref 6–20)
CO2: 24 mmol/L (ref 22–32)
Calcium: 8.9 mg/dL (ref 8.9–10.3)
Chloride: 106 mmol/L (ref 98–111)
Creatinine, Ser: 0.79 mg/dL (ref 0.61–1.24)
GFR, Estimated: >60 mL/min (ref >60)
Glucose, Bld: 90 mg/dL (ref 70–99)
Potassium: 3.8 mmol/L (ref 3.5–5.1)
Sodium: 137 mmol/L (ref 135–145)
Total Bilirubin: 0.7 mg/dL (ref 0.3–1.2)
Total Protein: 6.3 g/dL — ABNORMAL LOW (ref 6.5–8.1)

## 2020-09-08 MED ORDER — DIPHENHYDRAMINE HCL 25 MG PO CAPS
50.0000 mg | ORAL_CAPSULE | Freq: Once | ORAL | Status: AC
  Administered 2020-09-08: 50 mg via ORAL
  Filled 2020-09-08: qty 2

## 2020-09-08 MED ORDER — SODIUM CHLORIDE 0.9% FLUSH
10.0000 mL | Freq: Once | INTRAVENOUS | Status: AC
Start: 2020-09-08 — End: 2020-09-08
  Administered 2020-09-08: 10 mL via INTRAVENOUS
  Filled 2020-09-08: qty 10

## 2020-09-08 MED ORDER — SODIUM CHLORIDE 0.9 % IV SOLN: Freq: Once | INTRAVENOUS | Status: DC

## 2020-09-08 MED ORDER — SODIUM CHLORIDE 0.9 % IV SOLN
10.0000 mg | Freq: Once | INTRAVENOUS | Status: AC
  Administered 2020-09-08: 10 mg via INTRAVENOUS
  Filled 2020-09-08: qty 10

## 2020-09-08 MED ORDER — ONDANSETRON HCL 4 MG/2ML IJ SOLN
8.0000 mg | Freq: Once | INTRAMUSCULAR | Status: AC
  Administered 2020-09-08: 8 mg via INTRAVENOUS
  Filled 2020-09-08: qty 4

## 2020-09-08 MED ORDER — SODIUM CHLORIDE 0.9 % IV SOLN
375.0000 mg/m2 | Freq: Once | INTRAVENOUS | Status: AC
  Administered 2020-09-08: 800 mg via INTRAVENOUS
  Filled 2020-09-08: qty 50

## 2020-09-08 MED ORDER — ACETAMINOPHEN 325 MG PO TABS
650.0000 mg | ORAL_TABLET | Freq: Once | ORAL | Status: AC
  Administered 2020-09-08: 650 mg via ORAL
  Filled 2020-09-08: qty 2

## 2020-09-08 MED ORDER — PREDNISONE 50 MG PO TABS: ORAL_TABLET | ORAL | 0 refills | Status: DC

## 2020-09-08 MED ORDER — VINCRISTINE SULFATE CHEMO INJECTION 1 MG/ML
Freq: Once | INTRAVENOUS | Status: AC
  Filled 2020-09-08: qty 10

## 2020-09-08 MED ORDER — SODIUM CHLORIDE 0.9 % IV SOLN
INTRAVENOUS | Status: DC
  Filled 2020-09-08: qty 250

## 2020-09-08 NOTE — Progress Notes (Signed)
Cycle 1 Rituxan infused with no reaction. Ok to proceed with Rapid Rituxan. Cycle 2 mixed as slow infusion, however informed nurse to infuse and titrate as rapid. Will Mix future doses as rapid.

## 2020-09-08 NOTE — Patient Instructions (Addendum)
[facility]  Discharge Instructions: Thank you for choosing [facility] to provide your oncology and hematology care.  If you have a lab appointment with the [facility], please go directly to the [facility] and check in at the registration area.  Wear comfortable clothing and clothing appropriate for easy access to any Portacath or PICC line.   We strive to give you quality time with your provider. You may need to reschedule your appointment if you arrive late (15 or more minutes).  Arriving late affects you and other patients whose appointments are after yours.  Also, if you miss three or more appointments without notifying the office, you may be dismissed from the clinic at the provider's discretion.      For prescription refill requests, have your pharmacy contact our office and allow 72 hours for refills to be completed.    Today you received the following chemotherapy and/or immunotherapy agents Adriamycin, etoposide, vincristine, rituxan    To help prevent nausea and vomiting after your treatment, we encourage you to take your nausea medication as directed.  BELOW ARE SYMPTOMS THAT SHOULD BE REPORTED IMMEDIATELY: *FEVER GREATER THAN 100.4 F (38 C) OR HIGHER *CHILLS OR SWEATING *NAUSEA AND VOMITING THAT IS NOT CONTROLLED WITH YOUR NAUSEA MEDICATION *UNUSUAL SHORTNESS OF BREATH *UNUSUAL BRUISING OR BLEEDING *URINARY PROBLEMS (pain or burning when urinating, or frequent urination) *BOWEL PROBLEMS (unusual diarrhea, constipation, pain near the anus) TENDERNESS IN MOUTH AND THROAT WITH OR WITHOUT PRESENCE OF ULCERS (sore throat, sores in mouth, or a toothache) UNUSUAL RASH, SWELLING OR PAIN  UNUSUAL VAGINAL DISCHARGE OR ITCHING   Items with * indicate a potential emergency and should be followed up as soon as possible or go to the Emergency Department if any problems should occur.  Please show the CHEMOTHERAPY ALERT CARD or  IMMUNOTHERAPY ALERT CARD at check-in to the Emergency Department and triage nurse.  Should you have questions after your visit or need to cancel or reschedule your appointment, please contact [facility]  [phone] and follow the prompts.  Office hours are 8:00 a.m. to 4:30 p.m. Monday - Friday. Please note that voicemails left after 4:00 p.m. may not be returned until the following business day.  We are closed weekends and major holidays. You have access to a nurse at all times for urgent questions. Please call the main number to the clinic [phone] and follow the prompts.  For any non-urgent questions, you may also contact your provider using MyChart. We now offer e-Visits for anyone 68 and older to request care online for non-urgent symptoms. For details visit [email or web address].   Also download the MyChart app! Go to the app store, search "MyChart", open the app, select [facility], and log in with your MyChart username and password.  Due to Covid, a mask is required upon entering the hospital/clinic. If you do not have a mask, one will be given to you upon arrival. For doctor visits, patients may have 1 support person aged 15 or older with them. For treatment visits, patients cannot have anyone with them due to current Covid guidelines and our immunocompromised population.

## 2020-09-08 NOTE — Progress Notes (Signed)
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  This was followed by a PET CT scan which showed extensive FDG avid adenopathy in the neck chest abdomen and pelvis.  FDG avid splenic lesions.  Solitary intramuscular FDG avid lesion in the right biceps femoris muscle.   Supraclavicular excisional lymph node biopsy showed high-grade B-cell lymphoma germinal center type Ki-67 greater than 95%.  FISH testing was positive for Bcl-2 BCL6 and MYC consistent with triple hit lymphoma.  By NCCN IPI score would be 4 based on age and elevated LDH and stage IV (intramuscular biceps femoris lesion) which puts him in the high intermediate risk group. 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   Patient received RCHOP for cycle 1 with plans for [facility] with cycle 2. IT MTX for CNS prophylaxis.   MRI brain negative for lymphoma    Interval history-patient feels well today and denies any specific complaints at this time other than mild fatigue.  Denies any cough, sputum production, fever or shortness of breath.  ECOG PS- 1 Pain scale- 0   Review of systems- Review of Systems   Constitutional:  Positive for malaise/fatigue. Negative for chills, fever and weight loss.  HENT:  Negative for congestion, ear discharge and nosebleeds.   Eyes:  Negative for blurred vision.  Respiratory:  Negative for cough, hemoptysis, sputum production, shortness of breath and wheezing.   Cardiovascular:  Negative for chest pain, palpitations, orthopnea and claudication.  Gastrointestinal:  Negative for abdominal pain, blood in stool, constipation, diarrhea, heartburn, melena, nausea and vomiting.  Genitourinary:  Negative for dysuria, flank pain, frequency, hematuria and urgency.  Musculoskeletal:  Negative for back pain, joint pain and myalgias.  Skin:  Negative for rash.  Neurological:  Negative for dizziness, tingling, focal weakness, seizures, weakness and headaches.  Endo/Heme/Allergies:  Does not bruise/bleed easily.  Psychiatric/Behavioral:  Negative for depression and suicidal ideas. The patient does not have insomnia.      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     Past Surgical History:  Procedure Laterality Date   BONE MARROW BIOPSY     COLONOSCOPY     EXCISION MASS NECK Right 06/12/2020   Procedure: EXCISION MASS NECK;  Surgeon: Jules Husbands, MD;  Location: [facility] ORS;  Service: General;  Laterality: Right;   HERNIA REPAIR Right    at age 78-RIH   PORTA [place] Right 06/24/2020   Procedure: INSERTION PORT-A-CATH;  Surgeon: Jules Husbands, MD;  Location: [facility] ORS;  Service: General;  Laterality:  Right;    Social History   Socioeconomic History   Marital status: Single    Spouse name: Not on file   Number of children: Not on file   Years of education: Not on file   Highest education level: Not on file  Occupational History   Not on file  Tobacco Use   Smoking status: Former    Pack years:  0.00    Types: Cigarettes    Quit date: 06/13/2020    Years since quitting: 0.2   Smokeless tobacco: Never  Vaping Use   Vaping Use: Never used  Substance and Sexual Activity   Alcohol use: Not Currently   Drug use: Not Currently    Types: Marijuana   Sexual activity: Not Currently  Other Topics Concern   Not on file  Social History Narrative   Has daughter and grandson in the home   Social Determinants of Health   Financial Resource Strain: Not on file  Food Insecurity: Not on file  Transportation Needs: Not on file  Physical Activity: Not on file  Stress: Not on file  Social Connections: Not on file  Intimate Partner Violence: Not on file    Family History  Problem Relation Age of Onset   Anemia Mother    Hypertension Mother    Goiter Mother    Cancer Father    Cancer Sister    Multiple sclerosis Sister      Current Outpatient Medications:    aspirin EC 81 MG tablet, Take 81 mg by mouth daily as needed (heart health). Swallow whole., Disp: , Rfl:    atenolol (TENORMIN) 25 MG tablet, Take 25 mg by mouth daily., Disp: , Rfl:    lisinopril (ZESTRIL) 10 MG tablet, Take 10 mg by mouth daily., Disp: , Rfl:    predniSONE (DELTASONE) 50 MG tablet, Take 2 tablets each day with food early in am for 5 days while on chemo treatment, Disp: 10 tablet, Rfl: 0   Tenofovir Alafenamide Fumarate 25 MG TABS, Take 1 tablet (25 mg total) by mouth daily., Disp: 30 tablet, Rfl: 3   VITAMIN D PO, Take 1 capsule by mouth daily., Disp: , Rfl:    HYDROcodone-acetaminophen (NORCO/VICODIN) 5-325 MG tablet, Take 1 tablet by mouth every 6 (six) hours as needed for moderate pain. (Patient not taking: Reported on 09/08/2020), Disp: 15 tablet, Rfl: 0 No current facility-administered medications for this visit.  Facility-Administered Medications Ordered in Other Visits:    0.9 %  sodium chloride infusion, , Intravenous, Continuous, Sindy Guadeloupe, MD, Stopped at 08/12/20 1310   heparin lock flush 100  unit/mL, 500 Units, Intravenous, Once, Sindy Guadeloupe, MD   methotrexate (PF) 12 mg in sodium chloride (PF) 0.9 % INTRATHECAL chemo injection, , Intrathecal, Once, Sindy Guadeloupe, MD   sodium chloride flush (NS) 0.9 % injection 10 mL, 10 mL, Intravenous, PRN, Sindy Guadeloupe, MD, 10 mL at 08/13/20 1510  Physical exam:  Vitals:   09/08/20 0831  BP: 123/85  Pulse: 67  Resp: 18  Temp: (!) 96 F (35.6 C)  TempSrc: Tympanic  SpO2: 100%  Weight: 181 lb (82.1 kg)   Physical Exam Cardiovascular:     Rate and Rhythm: Normal rate and regular rhythm.     Heart sounds: Normal heart sounds.  Pulmonary:     Effort: Pulmonary effort is normal.     Breath sounds: Normal breath sounds.  Abdominal:     General: Bowel sounds are normal.  Palpations: Abdomen is soft.  Lymphadenopathy:     Comments: No palpable cervical, supraclavicular, axillary or inguinal adenopathy    Skin:    General: Skin is warm and dry.  Neurological:     Mental Status: He is alert and oriented to person, place, and time.     CMP Latest Ref Rng & Units 08/12/2020  Glucose 70 - 99 mg/dL 132(H)  BUN 6 - 20 mg/dL 11  Creatinine 0.61 - 1.24 mg/dL 0.86  Sodium 135 - 145 mmol/L 135  Potassium 3.5 - 5.1 mmol/L 3.6  Chloride 98 - 111 mmol/L 102  CO2 22 - 32 mmol/L 23  Calcium 8.9 - 10.3 mg/dL 9.0  Total Protein 6.5 - 8.1 g/dL 6.9  Total Bilirubin 0.3 - 1.2 mg/dL 0.6  Alkaline Phos 38 - 126 U/L 94  AST 15 - 41 U/L 25  ALT 0 - 44 U/L 18   CBC Latest Ref Rng & Units 08/12/2020  WBC 4.0 - 10.5 K/uL 4.6  Hemoglobin 13.0 - 17.0 g/dL 12.4(L)  Hematocrit 39.0 - 52.0 % 36.8(L)  Platelets 150 - 400 K/uL 260    No images are attached to the encounter.  DG FLUORO GUIDED LOC OF NEEDLE/CATH TIP FOR SPINAL INJECT LT  Result Date: 08/18/2020 CLINICAL DATA:  Patient presents for intrathecal methotrexate injection due to history of high-grade B-cell lymphoma. EXAM: DIAGNOSTIC LUMBAR PUNCTURE UNDER FLUOROSCOPIC GUIDANCE  COMPARISON:  Previous exams. FLUOROSCOPY TIME:  Fluoroscopy Time:  5 minutes 3 seconds Radiation Exposure Index (if provided by the fluoroscopic device): 108 mGy Number of Acquired Spot Images: 1 PROCEDURE: Informed consent was obtained from the patient prior to the procedure, including potential complications of headache, allergy, infection, bleeding and pain as well as possible methotrexate related complications including seizure neurotoxicity. With the patient prone, the lower back was prepped with Betadine. 1% Lidocaine was used for local anesthesia. After failed attempt at the L4-5 level, lumbar puncture was successfully performed at the L5-S1 level using a 10 cm 22 gauge needle with return of clear CSF. Subsequently, 12 mg of methotrexate in 5 mL was injected slowly followed by sterile saline flush. The patient tolerated the procedure well and there were no apparent complications. IMPRESSION: Successful fluoroscopic guided lumbar puncture at the L5-S1 level for intrathecal injection of 12 mg methotrexate. Electronically Signed   By: Marin Olp M.D.   On: 08/18/2020 10:45     Assessment and plan- Patient is a 60 y.o. male with stage IV triple hit high-grade B-cell lymphoma.  He is here for on treatment assessment prior to cycle 3 of dose adjusted R-EPOCH chemotherapy  Unfortunately twice weekly labs were not checked between cycle 2 and cycle 3 of chemotherapy.  We are therefore not able to ramp up his dose of chemotherapy this week.  Regardless his white count is 4.6 today with an ANC of 1.9.  I will therefore proceed with cycle 3 of dose adjusted R-EPOCH chemotherapy without dose escalation.  He will receive 1 for Neulasta on day 5.  Dose 3 of intrathecal methotrexate to be given next week.  Patient will come to clinic twice a week for labs CBC with differential to assess nadir ANC.  I will see him back in 3 weeks with CBC with differential, CMP for cycle 4 of dose adjusted R-EPOCH chemotherapy  if his counts permit.  If there is a delayed recovery of counts I may have to push his chemotherapy out by 1 more week.  We will obtain restaging  PET scan prior to cycle 4.  Plan is to complete 6 cycles of treatment with intrathecal methotrexate.  Patient has not vaccinated against COVID.  I have again encouraged him to consider and will shield COVID prophylaxis.  Patient would like to think about it  Hepatitis B core antibody positive.  He will remain on tenofovir   Visit Diagnosis 1. High grade B-cell lymphoma ([place])   2. Encounter for antineoplastic chemotherapy   3. Encounter for monitoring rituximab therapy      Dr. Randa Evens, MD, MPH [facility] [phone] 09/08/2020 8:32 AM

## 2020-09-08 NOTE — Progress Notes (Signed)
Patient here for oncology follow-up appointment, expresses no complaints or concerns at this time.    

## 2020-09-09 ENCOUNTER — Inpatient Hospital Stay: Payer: Medicaid Other

## 2020-09-09 VITALS — BP 124/77 | HR 85 | Resp 18

## 2020-09-09 DIAGNOSIS — C8510 Unspecified B-cell lymphoma, unspecified site: Secondary | ICD-10-CM

## 2020-09-09 MED ORDER — VINCRISTINE SULFATE CHEMO INJECTION 1 MG/ML
Freq: Once | INTRAVENOUS | Status: AC
  Filled 2020-09-09: qty 10

## 2020-09-09 MED ORDER — SODIUM CHLORIDE 0.9 % IV SOLN
INTRAVENOUS | Status: DC
  Filled 2020-09-09: qty 250

## 2020-09-09 MED ORDER — ONDANSETRON HCL 40 MG/20ML IJ SOLN
Freq: Once | INTRAMUSCULAR | Status: AC
Start: 2020-09-09 — End: 2020-09-09
  Administered 2020-09-09: 8 mg via INTRAVENOUS
  Filled 2020-09-09: qty 4

## 2020-09-09 NOTE — Patient Instructions (Addendum)
[facility]  Discharge Instructions: Thank you for choosing [facility] to provide your oncology and hematology care.  If you have a lab appointment with the [facility], please go directly to the [facility] and check in at the registration area.  Wear comfortable clothing and clothing appropriate for easy access to any Portacath or PICC line.   We strive to give you quality time with your provider. You may need to reschedule your appointment if you arrive late (15 or more minutes).  Arriving late affects you and other patients whose appointments are after yours.  Also, if you miss three or more appointments without notifying the office, you may be dismissed from the clinic at the provider's discretion.      For prescription refill requests, have your pharmacy contact our office and allow 72 hours for refills to be completed.    Today you received the following chemotherapy and/or immunotherapy agents       To help prevent nausea and vomiting after your treatment, we encourage you to take your nausea medication as directed.  BELOW ARE SYMPTOMS THAT SHOULD BE REPORTED IMMEDIATELY: *FEVER GREATER THAN 100.4 F (38 C) OR HIGHER *CHILLS OR SWEATING *NAUSEA AND VOMITING THAT IS NOT CONTROLLED WITH YOUR NAUSEA MEDICATION *UNUSUAL SHORTNESS OF BREATH *UNUSUAL BRUISING OR BLEEDING *URINARY PROBLEMS (pain or burning when urinating, or frequent urination) *BOWEL PROBLEMS (unusual diarrhea, constipation, pain near the anus) TENDERNESS IN MOUTH AND THROAT WITH OR WITHOUT PRESENCE OF ULCERS (sore throat, sores in mouth, or a toothache) UNUSUAL RASH, SWELLING OR PAIN  UNUSUAL VAGINAL DISCHARGE OR ITCHING   Items with * indicate a potential emergency and should be followed up as soon as possible or go to the Emergency Department if any problems should occur.  Please show the CHEMOTHERAPY ALERT CARD or IMMUNOTHERAPY ALERT CARD at check-in to the  Emergency Department and triage nurse.  Should you have questions after your visit or need to cancel or reschedule your appointment, please contact [facility]  [phone] and follow the prompts.  Office hours are 8:00 a.m. to 4:30 p.m. Monday - Friday. Please note that voicemails left after 4:00 p.m. may not be returned until the following business day.  We are closed weekends and major holidays. You have access to a nurse at all times for urgent questions. Please call the main number to the clinic [phone] and follow the prompts.  For any non-urgent questions, you may also contact your provider using MyChart. We now offer e-Visits for anyone 70 and older to request care online for non-urgent symptoms. For details visit [email or web address].   Also download the MyChart app! Go to the app store, search "MyChart", open the app, select [facility], and log in with your MyChart username and password.  Due to Covid, a mask is required upon entering the hospital/clinic. If you do not have a mask, one will be given to you upon arrival. For doctor visits, patients may have 1 support person aged 51 or older with them. For treatment visits, patients cannot have anyone with them due to current Covid guidelines and our immunocompromised population. Vincristine injection What is this medication? VINCRISTINE (vin KRIS teen) is a chemotherapy drug. It slows the growth of cancer cells. This medicine is used to treat many types of cancer like Hodgkin's disease, leukemia, non-Hodgkin's lymphoma, neuroblastoma (braincancer), rhabdomyosarcoma, and Wilms' tumor. This medicine may be used for other purposes; ask your health care provider orpharmacist  if you have questions. COMMON BRAND NAME(S): Oncovin, Vincasar PFS What should I tell my care team before I take this medication? They need to know if you have any of these conditions: blood disorders gout infection (especially  chickenpox, cold sores, or herpes) kidney disease liver disease lung disease nervous system disease like Charcot-Marie-Tooth (CMT) recent or ongoing radiation therapy an unusual or allergic reaction to vincristine, other chemotherapy agents, other medicines, foods, dyes, or preservatives pregnant or trying to get pregnant breast-feeding How should I use this medication? This drug is given as an infusion into a vein. It is administered in a hospital or clinic by a specially trained health care professional. If you have pain, swelling, burning, or any unusual feeling around the site of your injection,tell your health care professional right away. Talk to your pediatrician regarding the use of this medicine in children. Whilethis drug may be prescribed for selected conditions, precautions do apply. Overdosage: If you think you have taken too much of this medicine contact apoison control center or emergency room at once. NOTE: This medicine is only for you. Do not share this medicine with others. What if I miss a dose? It is important not to miss your dose. Call your doctor or health careprofessional if you are unable to keep an appointment. What may interact with this medication? certain medicines for fungal infections like itraconazole, ketoconazole, posaconazole, voriconazole certain medicines for seizures like phenytoin This list may not describe all possible interactions. Give your health care provider a list of all the medicines, herbs, non-prescription drugs, or dietary supplements you use. Also tell them if you smoke, drink alcohol, or use illegaldrugs. Some items may interact with your medicine. What should I watch for while using this medication? This drug may make you feel generally unwell. This is not uncommon, as chemotherapy can affect healthy cells as well as cancer cells. Report any side effects. Continue your course of treatment even though you feel ill unless yourdoctor tells you  to stop. You may need blood work done while you are taking this medicine. This medicine will cause constipation. Try to have a bowel movement at least every 2 to 3 days. If you do not have a bowel movement for 3 days, call yourdoctor or health care professional. In some cases, you may be given additional medicines to help with side effects.Follow all directions for their use. Do not become pregnant while taking this medicine. Women should inform their doctor if they wish to become pregnant or think they might be pregnant. There is a potential for serious side effects to an unborn child. Talk to your health care professional or pharmacist for more information. Do not breast-feed aninfant while taking this medicine. This medicine may make it more difficult to get pregnant or to father a child.Talk to your healthcare professional if you are concerned about your fertility. What side effects may I notice from receiving this medication? Side effects that you should report to your doctor or health care professionalas soon as possible: allergic reactions like skin rash, itching or hives, swelling of the face, lips, or tongue breathing problems confusion or changes in emotions or moods constipation cough mouth sores muscle weakness nausea and vomiting pain, swelling, redness or irritation at the injection site pain, tingling, numbness in the hands or feet problems with balance, talking, walking seizures stomach pain trouble passing urine or change in the amount of urine Side effects that usually do not require medical attention (report to yourdoctor or health care  professional if they continue or are bothersome): diarrhea hair loss jaw pain loss of appetite This list may not describe all possible side effects. Call your doctor for medical advice about side effects. You may report side effects to FDA at1-800-FDA-1088. Where should I keep my medication? This drug is given in a hospital or clinic  and will not be stored at home. NOTE: This sheet is a summary. It may not cover all possible information. If you have questions about this medicine, talk to your doctor, pharmacist, orhealth care provider.  2022 Elsevier/Gold Standard (2019-01-30 17:05:13) Etoposide, VP-16 injection What is this medication? ETOPOSIDE, VP-16 (e toe POE side) is a chemotherapy drug. It is used to treattesticular cancer, lung cancer, and other cancers. This medicine may be used for other purposes; ask your health care provider orpharmacist if you have questions. COMMON BRAND NAME(S): Etopophos, Toposar, VePesid What should I tell my care team before I take this medication? They need to know if you have any of these conditions: infection kidney disease liver disease low blood counts, like low white cell, platelet, or red cell counts an unusual or allergic reaction to etoposide, other medicines, foods, dyes, or preservatives pregnant or trying to get pregnant breast-feeding How should I use this medication? This medicine is for infusion into a vein. It is administered in a hospital orclinic by a specially trained health care professional. Talk to your pediatrician regarding the use of this medicine in children.Special care may be needed. Overdosage: If you think you have taken too much of this medicine contact apoison control center or emergency room at once. NOTE: This medicine is only for you. Do not share this medicine with others. What if I miss a dose? It is important not to miss your dose. Call your doctor or health careprofessional if you are unable to keep an appointment. What may interact with this medication? This medicine may interact with the following medications: warfarin This list may not describe all possible interactions. Give your health care provider a list of all the medicines, herbs, non-prescription drugs, or dietary supplements you use. Also tell them if you smoke, drink alcohol, or use  illegaldrugs. Some items may interact with your medicine. What should I watch for while using this medication? Visit your doctor for checks on your progress. This drug may make you feel generally unwell. This is not uncommon, as chemotherapy can affect healthy cells as well as cancer cells. Report any side effects. Continue your course oftreatment even though you feel ill unless your doctor tells you to stop. In some cases, you may be given additional medicines to help with side effects.Follow all directions for their use. Call your doctor or health care professional for advice if you get a fever, chills or sore throat, or other symptoms of a cold or flu. Do not treat yourself. This drug decreases your body's ability to fight infections. Try toavoid being around people who are sick. This medicine may increase your risk to bruise or bleed. Call your doctor orhealth care professional if you notice any unusual bleeding. Talk to your doctor about your risk of cancer. You may be more at risk forcertain types of cancers if you take this medicine. Do not become pregnant while taking this medicine or for at least 6 months after stopping it. Women should inform their doctor if they wish to become pregnant or think they might be pregnant. Women of child-bearing potential will need to have a negative pregnancy test before starting this medicine.  There is a potential for serious side effects to an unborn child. Talk to your health care professional or pharmacist for more information. Do not breast-feed aninfant while taking this medicine. Men must use a latex condom during sexual contact with a woman while taking this medicine and for at least 4 months after stopping it. A latex condom is needed even if you have had a vasectomy. Contact your doctor right away if your partner becomes pregnant. Do not donate sperm while taking this medicine and for at least 4 months after you stop taking this medicine. Men should inform  their doctors if they wish to father a child. This medicine may lower spermcounts. What side effects may I notice from receiving this medication? Side effects that you should report to your doctor or health care professionalas soon as possible: allergic reactions like skin rash, itching or hives, swelling of the face, lips, or tongue low blood counts - this medicine may decrease the number of white blood cells, red blood cells, and platelets. You may be at increased risk for infections and bleeding nausea, vomiting redness, blistering, peeling or loosening of the skin, including inside the mouth signs and symptoms of infection like fever; chills; cough; sore throat; pain or trouble passing urine signs and symptoms of low red blood cells or anemia such as unusually weak or tired; feeling faint or lightheaded; falls; breathing problems unusual bruising or bleeding Side effects that usually do not require medical attention (report to yourdoctor or health care professional if they continue or are bothersome): changes in taste diarrhea hair loss loss of appetite mouth sores This list may not describe all possible side effects. Call your doctor for medical advice about side effects. You may report side effects to FDA at1-800-FDA-1088. Where should I keep my medication? This drug is given in a hospital or clinic and will not be stored at home. NOTE: This sheet is a summary. It may not cover all possible information. If you have questions about this medicine, talk to your doctor, pharmacist, orhealth care provider.  2022 Elsevier/Gold Standard (2018-04-26 16:57:15) Doxorubicin injection What is this medication? DOXORUBICIN (dox oh ROO bi sin) is a chemotherapy drug. It is used to treat many kinds of cancer like leukemia, lymphoma, neuroblastoma, sarcoma, and Wilms' tumor. It is also used to treat bladder cancer, breast cancer, lungcancer, ovarian cancer, stomach cancer, and thyroid cancer. This  medicine may be used for other purposes; ask your health care provider orpharmacist if you have questions. COMMON BRAND NAME(S): Adriamycin, Adriamycin PFS, Adriamycin RDF, Rubex What should I tell my care team before I take this medication? They need to know if you have any of these conditions: heart disease history of low blood counts caused by a medicine liver disease recent or ongoing radiation therapy an unusual or allergic reaction to doxorubicin, other chemotherapy agents, other medicines, foods, dyes, or preservatives pregnant or trying to get pregnant breast-feeding How should I use this medication? This drug is given as an infusion into a vein. It is administered in a hospital or clinic by a specially trained health care professional. If you have pain, swelling, burning or any unusual feeling around the site of your injection,tell your health care professional right away. Talk to your pediatrician regarding the use of this medicine in children.Special care may be needed. Overdosage: If you think you have taken too much of this medicine contact apoison control center or emergency room at once. NOTE: This medicine is only for you. Do not  share this medicine with others. What if I miss a dose? It is important not to miss your dose. Call your doctor or health careprofessional if you are unable to keep an appointment. What may interact with this medication? This medicine may interact with the following medications: 6-mercaptopurine paclitaxel phenytoin [facility] John's Wort trastuzumab verapamil This list may not describe all possible interactions. Give your health care provider a list of all the medicines, herbs, non-prescription drugs, or dietary supplements you use. Also tell them if you smoke, drink alcohol, or use illegaldrugs. Some items may interact with your medicine. What should I watch for while using this medication? This drug may make you feel generally unwell. This is not  uncommon, as chemotherapy can affect healthy cells as well as cancer cells. Report any side effects. Continue your course of treatment even though you feel ill unless yourdoctor tells you to stop. There is a maximum amount of this medicine you should receive throughout your life. The amount depends on the medical condition being treated and your overall health. Your doctor will watch how much of this medicine you receive inyour lifetime. Tell your doctor if you have taken this medicine before. You may need blood work done while you are taking this medicine. Your urine may turn red for a few days after your dose. This is not blood. Ifyour urine is dark or brown, call your doctor. In some cases, you may be given additional medicines to help with side effects.Follow all directions for their use. Call your doctor or health care professional for advice if you get a fever, chills or sore throat, or other symptoms of a cold or flu. Do not treat yourself. This drug decreases your body's ability to fight infections. Try toavoid being around people who are sick. This medicine may increase your risk to bruise or bleed. Call your doctor orhealth care professional if you notice any unusual bleeding. Talk to your doctor about your risk of cancer. You may be more at risk forcertain types of cancers if you take this medicine. Do not become pregnant while taking this medicine or for 6 months after stopping it. Women should inform their doctor if they wish to become pregnant or think they might be pregnant. Men should not father a child while taking this medicine and for 6 months after stopping it. There is a potential for serious side effects to an unborn child. Talk to your health care professional or pharmacist for more information. Do not breast-feed an infant while takingthis medicine. This medicine has caused ovarian failure in some women and reduced sperm counts in some men This medicine may interfere with the ability  to have a child. Talk with your doctor or health care professional if you are concerned about yourfertility. This medicine may cause a decrease in Co-Enzyme Q-10. You should make sure that you get enough Co-Enzyme Q-10 while you are taking this medicine. Discuss thefoods you eat and the vitamins you take with your health care professional. What side effects may I notice from receiving this medication? Side effects that you should report to your doctor or health care professionalas soon as possible: allergic reactions like skin rash, itching or hives, swelling of the face, lips, or tongue breathing problems chest pain fast or irregular heartbeat low blood counts - this medicine may decrease the number of white blood cells, red blood cells and platelets. You may be at increased risk for infections and bleeding. pain, redness, or irritation at site where injected signs  of infection - fever or chills, cough, sore throat, pain or difficulty passing urine signs of decreased platelets or bleeding - bruising, pinpoint red spots on the skin, black, tarry stools, blood in the urine swelling of the ankles, feet, hands tiredness weakness Side effects that usually do not require medical attention (report to yourdoctor or health care professional if they continue or are bothersome): diarrhea hair loss mouth sores nail discoloration or damage nausea red colored urine vomiting This list may not describe all possible side effects. Call your doctor for medical advice about side effects. You may report side effects to FDA at1-800-FDA-1088. Where should I keep my medication? This drug is given in a hospital or clinic and will not be stored at home. NOTE: This sheet is a summary. It may not cover all possible information. If you have questions about this medicine, talk to your doctor, pharmacist, orhealth care provider.  2022 Elsevier/Gold Standard (2016-10-13 11:01:26)

## 2020-09-10 ENCOUNTER — Inpatient Hospital Stay: Payer: Medicaid Other

## 2020-09-10 ENCOUNTER — Other Ambulatory Visit: Payer: Self-pay

## 2020-09-10 VITALS — BP 136/64 | HR 76 | Temp 96.0°F | Resp 19

## 2020-09-10 DIAGNOSIS — C8510 Unspecified B-cell lymphoma, unspecified site: Secondary | ICD-10-CM

## 2020-09-10 MED ORDER — ETOPOSIDE CHEMO INJECTION 500 MG/25ML
Freq: Once | INTRAVENOUS | Status: AC
  Filled 2020-09-10: qty 10

## 2020-09-10 MED ORDER — SODIUM CHLORIDE 0.9 % IV SOLN
Freq: Once | INTRAVENOUS | Status: AC
  Administered 2020-09-10: 8 mg via INTRAVENOUS
  Filled 2020-09-10: qty 4

## 2020-09-10 MED ORDER — SODIUM CHLORIDE 0.9 % IV SOLN
Freq: Once | INTRAVENOUS | Status: AC
  Filled 2020-09-10: qty 250

## 2020-09-10 NOTE — Patient Instructions (Signed)
[facility]   Discharge Instructions: Thank you for choosing [facility] to provide your oncology and hematology care.  If you have a lab appointment with the [facility], please go directly to the [facility] and check in at the registration area.  Wear comfortable clothing and clothing appropriate for easy access to any Portacath or PICC line.   We strive to give you quality time with your provider. You may need to reschedule your appointment if you arrive late (15 or more minutes).  Arriving late affects you and other patients whose appointments are after yours.  Also, if you miss three or more appointments without notifying the office, you may be dismissed from the clinic at the provider's discretion.      For prescription refill requests, have your pharmacy contact our office and allow 72 hours for refills to be completed.    Today you received the following chemotherapy and/or immunotherapy agents A   To help prevent nausea and vomiting after your treatment, we encourage you to take your nausea medication as directed.  BELOW ARE SYMPTOMS THAT SHOULD BE REPORTED IMMEDIATELY: *FEVER GREATER THAN 100.4 F (38 C) OR HIGHER *CHILLS OR SWEATING *NAUSEA AND VOMITING THAT IS NOT CONTROLLED WITH YOUR NAUSEA MEDICATION *UNUSUAL SHORTNESS OF BREATH *UNUSUAL BRUISING OR BLEEDING *URINARY PROBLEMS (pain or burning when urinating, or frequent urination) *BOWEL PROBLEMS (unusual diarrhea, constipation, pain near the anus) TENDERNESS IN MOUTH AND THROAT WITH OR WITHOUT PRESENCE OF ULCERS (sore throat, sores in mouth, or a toothache) UNUSUAL RASH, SWELLING OR PAIN  UNUSUAL VAGINAL DISCHARGE OR ITCHING   Items with * indicate a potential emergency and should be followed up as soon as possible or go to the Emergency Department if any problems should occur.  Please show the CHEMOTHERAPY ALERT CARD or IMMUNOTHERAPY ALERT CARD at check-in to the  Emergency Department and triage nurse.  Should you have questions after your visit or need to cancel or reschedule your appointment, please contact [facility]  [phone] and follow the prompts.  Office hours are 8:00 a.m. to 4:30 p.m. Monday - Friday. Please note that voicemails left after 4:00 p.m. may not be returned until the following business day.  We are closed weekends and major holidays. You have access to a nurse at all times for urgent questions. Please call the main number to the clinic [phone] and follow the prompts.  For any non-urgent questions, you may also contact your provider using MyChart. We now offer e-Visits for anyone 3 and older to request care online for non-urgent symptoms. For details visit [email or web address].   Also download the MyChart app! Go to the app store, search "MyChart", open the app, select [facility], and log in with your MyChart username and password.  Due to Covid, a mask is required upon entering the hospital/clinic. If you do not have a mask, one will be given to you upon arrival. For doctor visits, patients may have 1 support person aged 4 or older with them. For treatment visits, patients cannot have anyone with them due to current Covid guidelines and our immunocompromised population.

## 2020-09-11 ENCOUNTER — Inpatient Hospital Stay: Payer: Medicaid Other

## 2020-09-11 VITALS — BP 132/80 | HR 69 | Temp 98.2°F | Resp 18

## 2020-09-11 DIAGNOSIS — C8510 Unspecified B-cell lymphoma, unspecified site: Secondary | ICD-10-CM

## 2020-09-11 MED ORDER — ONDANSETRON HCL 40 MG/20ML IJ SOLN
Freq: Once | INTRAMUSCULAR | Status: AC
  Administered 2020-09-11: 8 mg via INTRAVENOUS
  Filled 2020-09-11: qty 4

## 2020-09-11 MED ORDER — SODIUM CHLORIDE 0.9 % IV SOLN
INTRAVENOUS | Status: AC
Start: 2020-09-11 — End: ?
  Filled 2020-09-11: qty 250

## 2020-09-11 MED ORDER — VINCRISTINE SULFATE CHEMO INJECTION 1 MG/ML
Freq: Once | INTRAVENOUS | Status: AC
  Filled 2020-09-11: qty 10

## 2020-09-11 NOTE — Patient Instructions (Signed)
[facility]  Discharge Instructions: Thank you for choosing [facility] to provide your oncology and hematology care.  If you have a lab appointment with the [facility], please go directly to the [facility] and check in at the registration area.  Wear comfortable clothing and clothing appropriate for easy access to any Portacath or PICC line.   We strive to give you quality time with your provider. You may need to reschedule your appointment if you arrive late (15 or more minutes).  Arriving late affects you and other patients whose appointments are after yours.  Also, if you miss three or more appointments without notifying the office, you may be dismissed from the clinic at the provider's discretion.      For prescription refill requests, have your pharmacy contact our office and allow 72 hours for refills to be completed.    Today you received the following chemotherapy and/or immunotherapy agents CHEMO PUMP      To help prevent nausea and vomiting after your treatment, we encourage you to take your nausea medication as directed.  BELOW ARE SYMPTOMS THAT SHOULD BE REPORTED IMMEDIATELY: *FEVER GREATER THAN 100.4 F (38 C) OR HIGHER *CHILLS OR SWEATING *NAUSEA AND VOMITING THAT IS NOT CONTROLLED WITH YOUR NAUSEA MEDICATION *UNUSUAL SHORTNESS OF BREATH *UNUSUAL BRUISING OR BLEEDING *URINARY PROBLEMS (pain or burning when urinating, or frequent urination) *BOWEL PROBLEMS (unusual diarrhea, constipation, pain near the anus) TENDERNESS IN MOUTH AND THROAT WITH OR WITHOUT PRESENCE OF ULCERS (sore throat, sores in mouth, or a toothache) UNUSUAL RASH, SWELLING OR PAIN  UNUSUAL VAGINAL DISCHARGE OR ITCHING   Items with * indicate a potential emergency and should be followed up as soon as possible or go to the Emergency Department if any problems should occur.  Please show the CHEMOTHERAPY ALERT CARD or IMMUNOTHERAPY ALERT CARD at check-in  to the Emergency Department and triage nurse.  Should you have questions after your visit or need to cancel or reschedule your appointment, please contact [facility]  [phone] and follow the prompts.  Office hours are 8:00 a.m. to 4:30 p.m. Monday - Friday. Please note that voicemails left after 4:00 p.m. may not be returned until the following business day.  We are closed weekends and major holidays. You have access to a nurse at all times for urgent questions. Please call the main number to the clinic [phone] and follow the prompts.  For any non-urgent questions, you may also contact your provider using MyChart. We now offer e-Visits for anyone 81 and older to request care online for non-urgent symptoms. For details visit [email or web address].   Also download the MyChart app! Go to the app store, search "MyChart", open the app, select [facility], and log in with your MyChart username and password.  Due to Covid, a mask is required upon entering the hospital/clinic. If you do not have a mask, one will be given to you upon arrival. For doctor visits, patients may have 1 support person aged 59 or older with them. For treatment visits, patients cannot have anyone with them due to current Covid guidelines and our immunocompromised population.

## 2020-09-12 ENCOUNTER — Inpatient Hospital Stay: Payer: Medicaid Other | Attending: Oncology

## 2020-09-12 ENCOUNTER — Other Ambulatory Visit: Payer: Self-pay

## 2020-09-12 VITALS — BP 122/63 | HR 71 | Temp 98.3°F | Resp 20

## 2020-09-12 DIAGNOSIS — Z8269 Family history of other diseases of the musculoskeletal system and connective tissue: Secondary | ICD-10-CM | POA: Insufficient documentation

## 2020-09-12 DIAGNOSIS — Z5111 Encounter for antineoplastic chemotherapy: Secondary | ICD-10-CM | POA: Insufficient documentation

## 2020-09-12 DIAGNOSIS — Z5189 Encounter for other specified aftercare: Secondary | ICD-10-CM | POA: Insufficient documentation

## 2020-09-12 DIAGNOSIS — Z832 Family history of diseases of the blood and blood-forming organs and certain disorders involving the immune mechanism: Secondary | ICD-10-CM | POA: Insufficient documentation

## 2020-09-12 DIAGNOSIS — Z8249 Family history of ischemic heart disease and other diseases of the circulatory system: Secondary | ICD-10-CM | POA: Insufficient documentation

## 2020-09-12 DIAGNOSIS — Z87891 Personal history of nicotine dependence: Secondary | ICD-10-CM | POA: Insufficient documentation

## 2020-09-12 DIAGNOSIS — Z79899 Other long term (current) drug therapy: Secondary | ICD-10-CM | POA: Insufficient documentation

## 2020-09-12 DIAGNOSIS — C8510 Unspecified B-cell lymphoma, unspecified site: Secondary | ICD-10-CM

## 2020-09-12 DIAGNOSIS — Z809 Family history of malignant neoplasm, unspecified: Secondary | ICD-10-CM | POA: Insufficient documentation

## 2020-09-12 DIAGNOSIS — N179 Acute kidney failure, unspecified: Secondary | ICD-10-CM | POA: Insufficient documentation

## 2020-09-12 DIAGNOSIS — Z8349 Family history of other endocrine, nutritional and metabolic diseases: Secondary | ICD-10-CM | POA: Insufficient documentation

## 2020-09-12 MED ORDER — HEPARIN SOD (PORK) LOCK FLUSH 100 UNIT/ML IV SOLN
INTRAVENOUS | Status: AC
  Filled 2020-09-12: qty 5

## 2020-09-12 MED ORDER — ONDANSETRON HCL 40 MG/20ML IJ SOLN
Freq: Once | INTRAMUSCULAR | Status: AC
  Administered 2020-09-12: 8 mg via INTRAVENOUS
  Filled 2020-09-12: qty 8

## 2020-09-12 MED ORDER — SODIUM CHLORIDE 0.9 % IV SOLN
INTRAVENOUS | Status: DC
  Filled 2020-09-12: qty 250

## 2020-09-12 MED ORDER — HEPARIN SOD (PORK) LOCK FLUSH 100 UNIT/ML IV SOLN
500.0000 [IU] | Freq: Once | INTRAVENOUS | Status: AC
  Administered 2020-09-12: 500 [IU] via INTRAVENOUS
  Filled 2020-09-12: qty 5

## 2020-09-12 MED ORDER — SODIUM CHLORIDE 0.9 % IV SOLN
1500.0000 mg | Freq: Once | INTRAVENOUS | Status: AC
  Administered 2020-09-12: 1500 mg via INTRAVENOUS
  Filled 2020-09-12: qty 75

## 2020-09-12 MED ORDER — PEGFILGRASTIM 6 MG/0.6ML ~~LOC~~ PSKT
6.0000 mg | PREFILLED_SYRINGE | Freq: Once | SUBCUTANEOUS | Status: AC
  Administered 2020-09-12: 6 mg via SUBCUTANEOUS
  Filled 2020-09-12: qty 0.6

## 2020-09-12 NOTE — Patient Instructions (Signed)
[facility]  Discharge Instructions: Thank you for choosing [facility] to provide your oncology and hematology care.  If you have a lab appointment with the [facility], please go directly to the [facility] and check in at the registration area.  Wear comfortable clothing and clothing appropriate for easy access to any Portacath or PICC line.   We strive to give you quality time with your provider. You may need to reschedule your appointment if you arrive late (15 or more minutes).  Arriving late affects you and other patients whose appointments are after yours.  Also, if you miss three or more appointments without notifying the office, you may be dismissed from the clinic at the provider's discretion.      For prescription refill requests, have your pharmacy contact our office and allow 72 hours for refills to be completed.    Today you received the following chemotherapy and/or immunotherapy agents Cytoxan      To help prevent nausea and vomiting after your treatment, we encourage you to take your nausea medication as directed.  BELOW ARE SYMPTOMS THAT SHOULD BE REPORTED IMMEDIATELY: *FEVER GREATER THAN 100.4 F (38 C) OR HIGHER *CHILLS OR SWEATING *NAUSEA AND VOMITING THAT IS NOT CONTROLLED WITH YOUR NAUSEA MEDICATION *UNUSUAL SHORTNESS OF BREATH *UNUSUAL BRUISING OR BLEEDING *URINARY PROBLEMS (pain or burning when urinating, or frequent urination) *BOWEL PROBLEMS (unusual diarrhea, constipation, pain near the anus) TENDERNESS IN MOUTH AND THROAT WITH OR WITHOUT PRESENCE OF ULCERS (sore throat, sores in mouth, or a toothache) UNUSUAL RASH, SWELLING OR PAIN  UNUSUAL VAGINAL DISCHARGE OR ITCHING   Items with * indicate a potential emergency and should be followed up as soon as possible or go to the Emergency Department if any problems should occur.  Please show the CHEMOTHERAPY ALERT CARD or IMMUNOTHERAPY ALERT CARD at check-in to  the Emergency Department and triage nurse.  Should you have questions after your visit or need to cancel or reschedule your appointment, please contact [facility]  [phone] and follow the prompts.  Office hours are 8:00 a.m. to 4:30 p.m. Monday - Friday. Please note that voicemails left after 4:00 p.m. may not be returned until the following business day.  We are closed weekends and major holidays. You have access to a nurse at all times for urgent questions. Please call the main number to the clinic [phone] and follow the prompts.  For any non-urgent questions, you may also contact your provider using MyChart. We now offer e-Visits for anyone 39 and older to request care online for non-urgent symptoms. For details visit [email or web address].   Also download the MyChart app! Go to the app store, search "MyChart", open the app, select [facility], and log in with your MyChart username and password.  Due to Covid, a mask is required upon entering the hospital/clinic. If you do not have a mask, one will be given to you upon arrival. For doctor visits, patients may have 1 support person aged 29 or older with them. For treatment visits, patients cannot have anyone with them due to current Covid guidelines and our immunocompromised population. Cyclophosphamide Injection What is this medication? CYCLOPHOSPHAMIDE (sye kloe FOSS fa mide) is a chemotherapy drug. It slows the growth of cancer cells. This medicine is used to treat many types of cancer like lymphoma, myeloma, leukemia, breast cancer, and ovarian cancer, to name afew. This medicine may be used for other purposes; ask your health care  provider orpharmacist if you have questions. COMMON BRAND NAME(S): Cytoxan, Neosar What should I tell my care team before I take this medication? They need to know if you have any of these conditions: heart disease history of irregular heartbeat infection kidney  disease liver disease low blood counts, like white cells, platelets, or red blood cells on hemodialysis recent or ongoing radiation therapy scarring or thickening of the lungs trouble passing urine an unusual or allergic reaction to cyclophosphamide, other medicines, foods, dyes, or preservatives pregnant or trying to get pregnant breast-feeding How should I use this medication? This drug is usually given as an injection into a vein or muscle or by infusion into a vein. It is administered in a hospital or clinic by a specially trainedhealth care professional. Talk to your pediatrician regarding the use of this medicine in children.Special care may be needed. Overdosage: If you think you have taken too much of this medicine contact apoison control center or emergency room at once. NOTE: This medicine is only for you. Do not share this medicine with others. What if I miss a dose? It is important not to miss your dose. Call your doctor or health careprofessional if you are unable to keep an appointment. What may interact with this medication? amphotericin B azathioprine certain antivirals for HIV or hepatitis certain medicines for blood pressure, heart disease, irregular heart beat certain medicines that treat or prevent blood clots like warfarin certain other medicines for cancer cyclosporine etanercept indomethacin medicines that relax muscles for surgery medicines to increase blood counts metronidazole This list may not describe all possible interactions. Give your health care provider a list of all the medicines, herbs, non-prescription drugs, or dietary supplements you use. Also tell them if you smoke, drink alcohol, or use illegaldrugs. Some items may interact with your medicine. What should I watch for while using this medication? Your condition will be monitored carefully while you are receiving thismedicine. You may need blood work done while you are taking this  medicine. Drink water or other fluids as directed. Urinate often, even at night. Some products may contain alcohol. Ask your health care professional if this medicine contains alcohol. Be sure to tell all health care professionals you are taking this medicine. Certain medicines, like metronidazole and disulfiram, can cause an unpleasant reaction when taken with alcohol. The reaction includes flushing, headache, nausea, vomiting, sweating, and increased thirst. Thereaction can last from 30 minutes to several hours. Do not become pregnant while taking this medicine or for 1 year after stopping it. Women should inform their health care professional if they wish to become pregnant or think they might be pregnant. Men should not father a child while taking this medicine and for 4 months after stopping it. There is potential for serious side effects to an unborn child. Talk to your health care professionalfor more information. Do not breast-feed an infant while taking this medicine or for 1 week afterstopping it. This medicine has caused ovarian failure in some women. This medicine may make it more difficult to get pregnant. Talk to your health care professional if [place] concerned about your fertility. This medicine has caused decreased sperm counts in some men. This may make it more difficult to father a child. Talk to your health care professional if [place] concerned about your fertility. Call your health care professional for advice if you get a fever, chills, or sore throat, or other symptoms of a cold or flu. Do not treat yourself. This medicine decreases your body's  ability to fight infections. Try to avoid beingaround people who are sick. Avoid taking medicines that contain aspirin, acetaminophen, ibuprofen, naproxen, or ketoprofen unless instructed by your health care professional.These medicines may hide a fever. Talk to your health care professional about your risk of cancer. You may bemore at risk for  certain types of cancer if you take this medicine. If you are going to need surgery or other procedure, tell your health careprofessional that you are using this medicine. Be careful brushing or flossing your teeth or using a toothpick because you may get an infection or bleed more easily. If you have any dental work done, Primary school teacher you are receiving this medicine. What side effects may I notice from receiving this medication? Side effects that you should report to your doctor or health care professionalas soon as possible: allergic reactions like skin rash, itching or hives, swelling of the face, lips, or tongue breathing problems nausea, vomiting signs and symptoms of bleeding such as bloody or black, tarry stools; red or dark brown urine; spitting up blood or brown material that looks like coffee grounds; red spots on the skin; unusual bruising or bleeding from the eyes, gums, or nose signs and symptoms of heart failure like fast, irregular heartbeat, sudden weight gain; swelling of the ankles, feet, hands signs and symptoms of infection like fever; chills; cough; sore throat; pain or trouble passing urine signs and symptoms of kidney injury like trouble passing urine or change in the amount of urine signs and symptoms of liver injury like dark yellow or brown urine; general ill feeling or flu-like symptoms; light-colored stools; loss of appetite; nausea; right upper belly pain; unusually weak or tired; yellowing of the eyes or skin Side effects that usually do not require medical attention (report to yourdoctor or health care professional if they continue or are bothersome): confusion decreased hearing diarrhea facial flushing hair loss headache loss of appetite missed menstrual periods signs and symptoms of low red blood cells or anemia such as unusually weak or tired; feeling faint or lightheaded; falls skin discoloration This list may not describe all possible side effects. Call  your doctor for medical advice about side effects. You may report side effects to FDA at1-800-FDA-1088. Where should I keep my medication? This drug is given in a hospital or clinic and will not be stored at home. NOTE: This sheet is a summary. It may not cover all possible information. If you have questions about this medicine, talk to your doctor, pharmacist, orhealth care provider.  2022 Elsevier/Gold Standard (2018-12-04 09:53:29)

## 2020-09-16 ENCOUNTER — Inpatient Hospital Stay: Payer: Medicaid Other

## 2020-09-16 DIAGNOSIS — C8510 Unspecified B-cell lymphoma, unspecified site: Secondary | ICD-10-CM

## 2020-09-16 LAB — COMPREHENSIVE METABOLIC PANEL
ALT: 23 U/L (ref 0–44)
AST: 19 U/L (ref 15–41)
Albumin: 3.7 g/dL (ref 3.5–5.0)
Alkaline Phosphatase: 56 U/L (ref 38–126)
Anion gap: 7 (ref 5–15)
BUN: 15 mg/dL (ref 8–23)
CO2: 30 mmol/L (ref 22–32)
Calcium: 8.9 mg/dL (ref 8.9–10.3)
Chloride: 101 mmol/L (ref 98–111)
Creatinine, Ser: 0.85 mg/dL (ref 0.61–1.24)
GFR, Estimated: >60 mL/min (ref >60)
Glucose, Bld: 119 mg/dL — ABNORMAL HIGH (ref 70–99)
Potassium: 3.3 mmol/L — ABNORMAL LOW (ref 3.5–5.1)
Sodium: 138 mmol/L (ref 135–145)
Total Bilirubin: 0.6 mg/dL (ref 0.3–1.2)
Total Protein: 6.1 g/dL — ABNORMAL LOW (ref 6.5–8.1)

## 2020-09-16 LAB — CBC WITH DIFFERENTIAL/PLATELET
Abs Immature Granulocytes: 0.07 10*3/uL (ref 0.00–0.07)
Basophils Absolute: 0 10*3/uL (ref 0.0–0.1)
Basophils Relative: 0 %
Eosinophils Absolute: 0.2 10*3/uL (ref 0.0–0.5)
Eosinophils Relative: 4 %
HCT: 30.3 % — ABNORMAL LOW (ref 39.0–52.0)
Hemoglobin: 10.6 g/dL — ABNORMAL LOW (ref 13.0–17.0)
Immature Granulocytes: 2 %
Lymphocytes Relative: 25 %
Lymphs Abs: 1.2 10*3/uL (ref 0.7–4.0)
MCH: 31 pg (ref 26.0–34.0)
MCHC: 35 g/dL (ref 30.0–36.0)
MCV: 88.6 fL (ref 80.0–100.0)
Monocytes Absolute: 0 10*3/uL — ABNORMAL LOW (ref 0.1–1.0)
Monocytes Relative: 1 %
Neutro Abs: 3.2 10*3/uL (ref 1.7–7.7)
Neutrophils Relative %: 68 %
Platelets: 215 10*3/uL (ref 150–400)
RBC: 3.42 MIL/uL — ABNORMAL LOW (ref 4.22–5.81)
RDW: 16.1 % — ABNORMAL HIGH (ref 11.5–15.5)
Smear Review: NORMAL
WBC: 4.7 10*3/uL (ref 4.0–10.5)
nRBC: 0 % (ref 0.0–0.2)

## 2020-09-17 ENCOUNTER — Other Ambulatory Visit: Payer: Self-pay

## 2020-09-17 ENCOUNTER — Ambulatory Visit
Admission: RE | Admit: 2020-09-17 | Discharge: 2020-09-17 | Disposition: A | Discharge status: Home / Self Care | Payer: Self-pay | Source: Ambulatory Visit | Attending: Oncology | Admitting: Oncology

## 2020-09-17 ENCOUNTER — Ambulatory Visit
Admission: RE | Admit: 2020-09-17 | Discharge: 2020-09-17 | Disposition: A | Discharge status: Home / Self Care | Payer: Medicaid Other | Source: Ambulatory Visit | Attending: Oncology | Admitting: Oncology

## 2020-09-17 ENCOUNTER — Inpatient Hospital Stay: Payer: Medicaid Other

## 2020-09-17 VITALS — BP 138/88 | HR 63 | Temp 97.8°F | Resp 20 | Ht 73.0 in | Wt 181.0 lb

## 2020-09-17 DIAGNOSIS — C8510 Unspecified B-cell lymphoma, unspecified site: Secondary | ICD-10-CM

## 2020-09-17 LAB — GLUCOSE, CAPILLARY: Glucose-Capillary: 83 mg/dL (ref 70–99)

## 2020-09-17 IMAGING — CT NM PET TUM IMG RESTAG (PS) SKULL BASE T - THIGH
10 series · 24 of 25 positions shown · non-contrast
Comparison: PET-CT [DATE]

CLINICAL DATA: Subsequent treatment strategy for high-grade B-cell
lymphoma. Status post 3 cycles of chemotherapy and status post
intrathecal administration methotrexate.

EXAM:
NUCLEAR MEDICINE PET SKULL BASE TO THIGH
TECHNIQUE: 10.24 mCi F-18 FDG was injected intravenously. Full-ring PET imaging
was performed from the skull base to thigh after the radiotracer. CT
data was obtained and used for attenuation correction and anatomic
localization.
Fasting blood glucose: 83 mg/dl

[Series 3: ct wb 5.0 b30f · axial · 5.0mm · 0.98mm/px · z∈[-267,+717]mm · 3 of 329 slices shown]
[im 1/329]
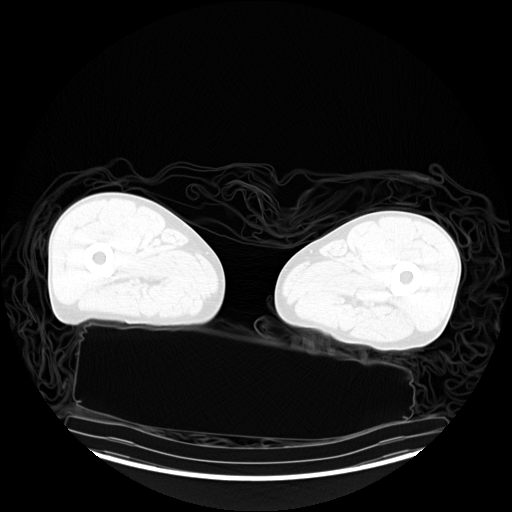
[im 165/329]
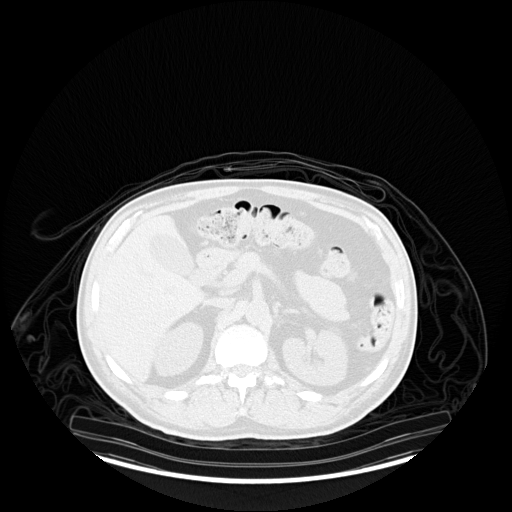
[im 329/329  brain]
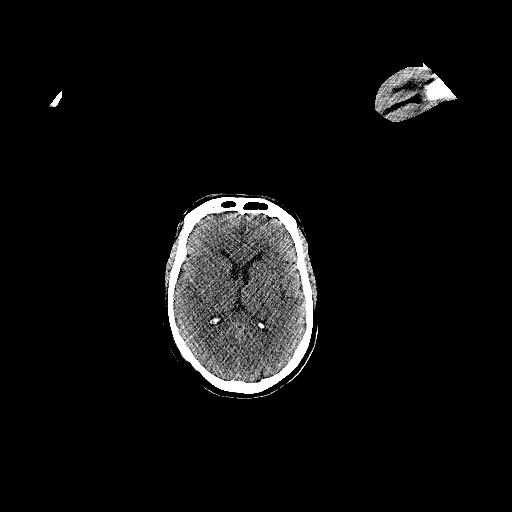

[Series 5: pet wb uncorrected (nac) · axial · 5.0mm · 4.07mm/px · z∈[-267,+717]mm · 3 of 329 slices shown]
[im 1/329]
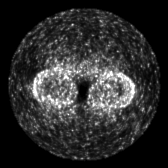
[im 165/329]
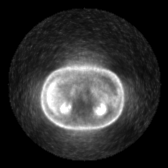
[im 329/329]
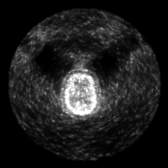

[Series 6: pet wb (ac) · axial · 5.0mm · 3.13mm/px · z∈[-267,+717]mm · 4 of 329 slices shown]
[im 1/329]
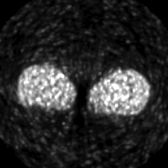
[im 110/329]
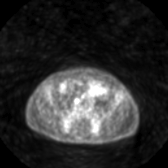
[im 219/329]
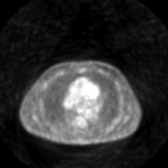
[im 329/329]
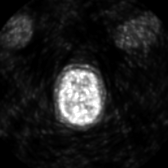

[Series 603: pet axial fused · 3 of 328 slices shown]
[im 1/328]
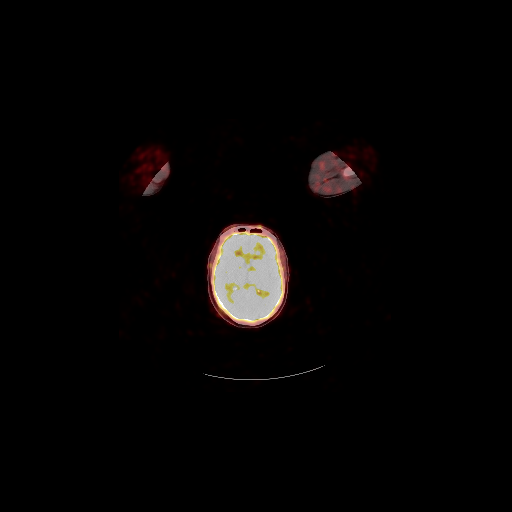
[im 110/328]
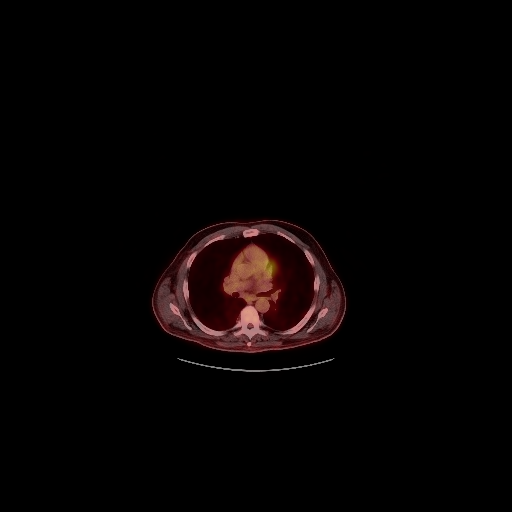
[im 328/328]
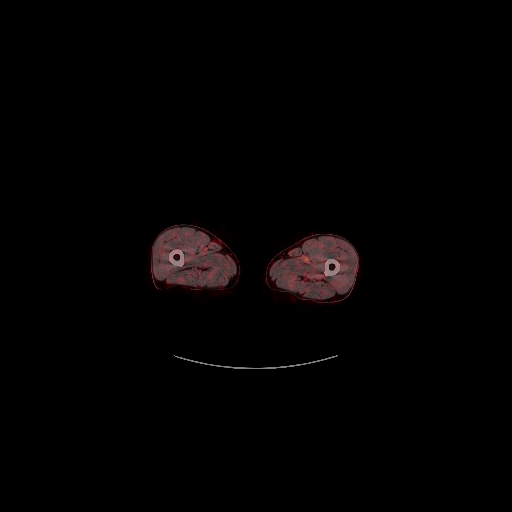

[Series 604: pet coronal fused · 1 of 102 slices shown]
[im 1/102]
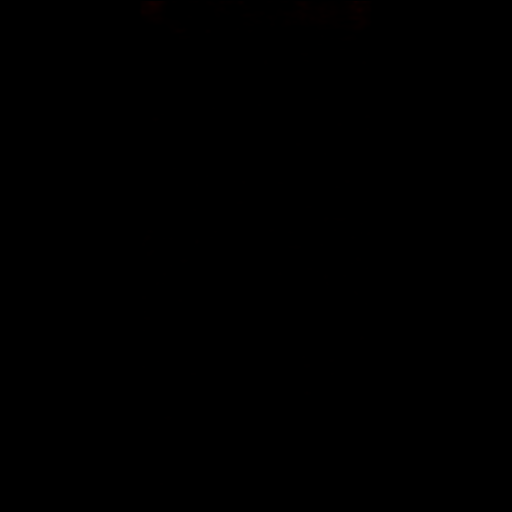

[Series 605: pet sagittal fused · 2 of 151 slices shown]
[im 1/151]
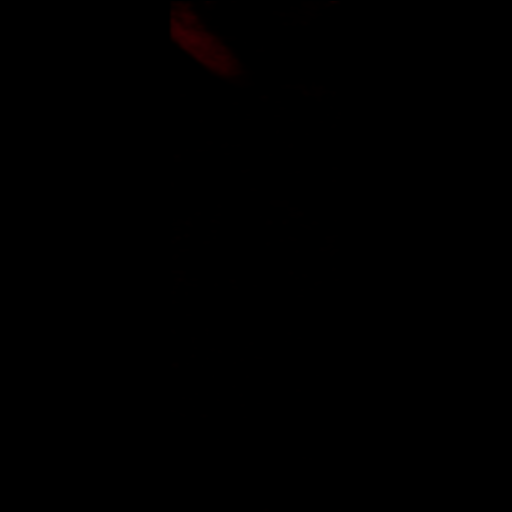
[im 151/151]
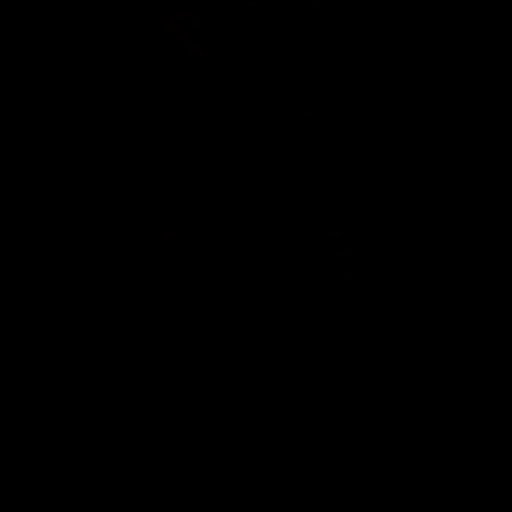

[Series 606: pet axial · 4 of 328 slices shown]
[im 1/328]
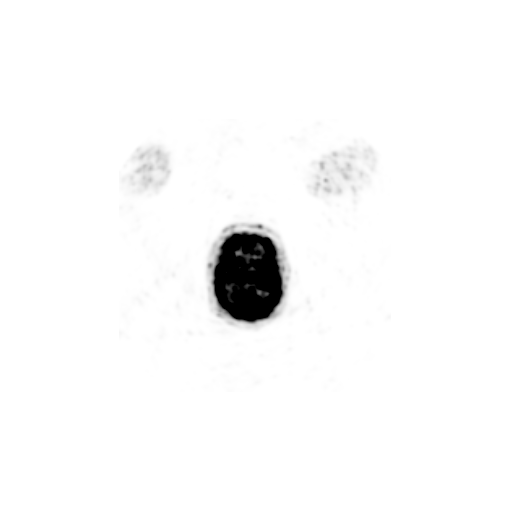
[im 110/328]
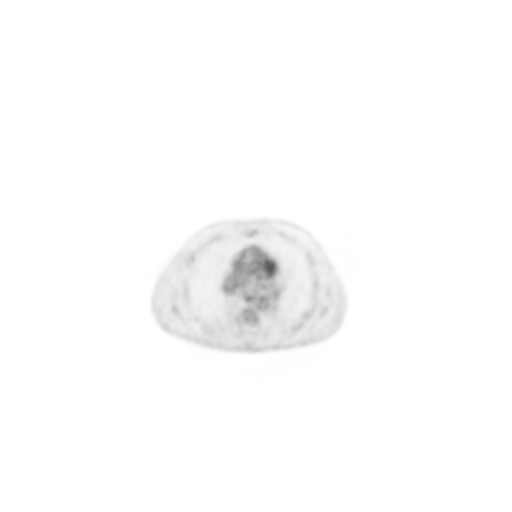
[im 219/328]
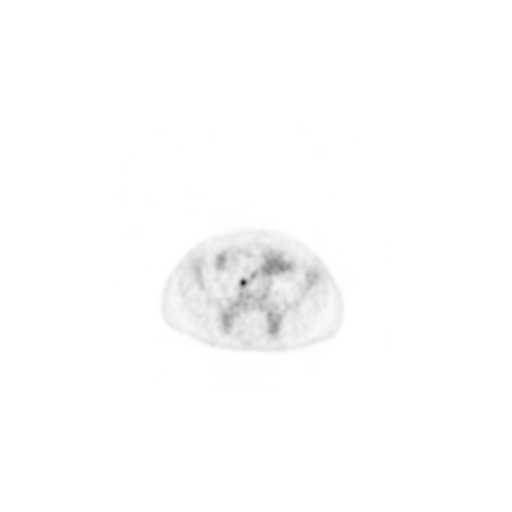
[im 328/328]
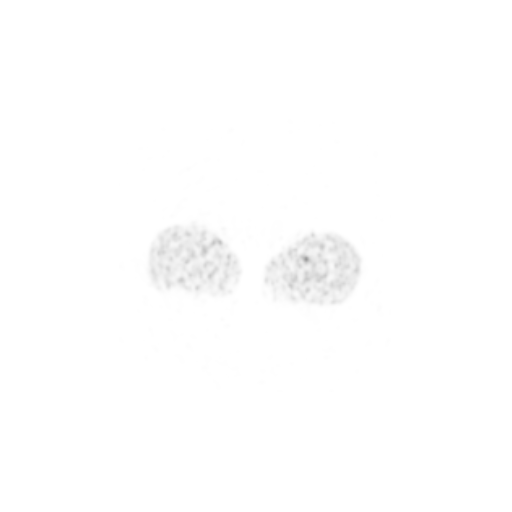

[Series 607: pet coronal · 1 of 124 slices shown]
[im 1/124]
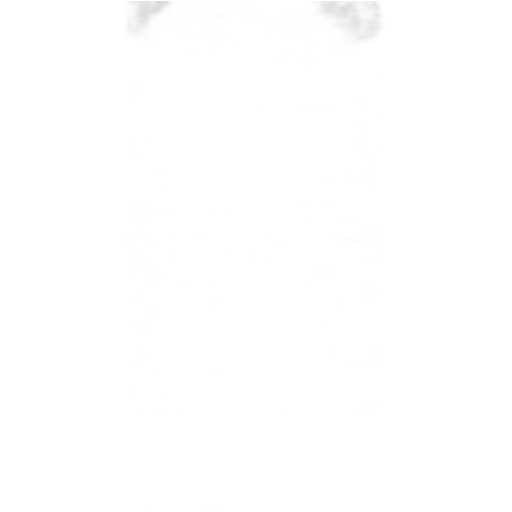

[Series 608: pet sagittal · 2 of 166 slices shown]
[im 1/166]
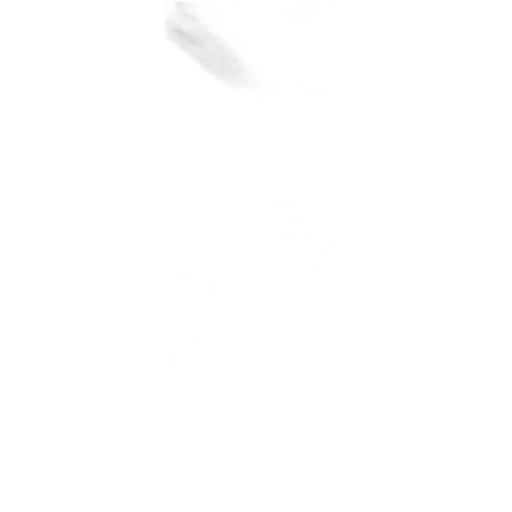
[im 166/166]
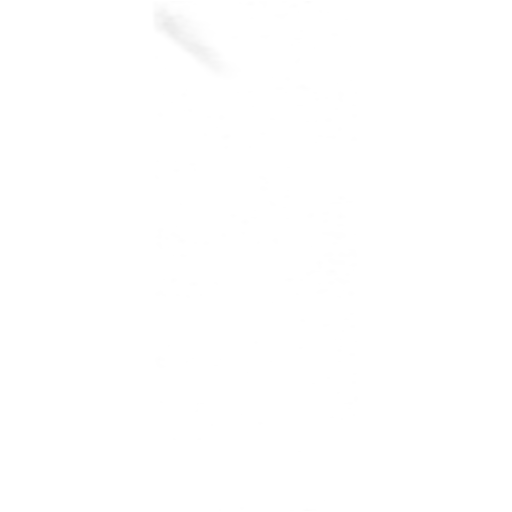

[Series 8157: results mm oncology reading · 5.0mm · 0.78mm/px · 1 of 5 slices shown]
[im 1/5]
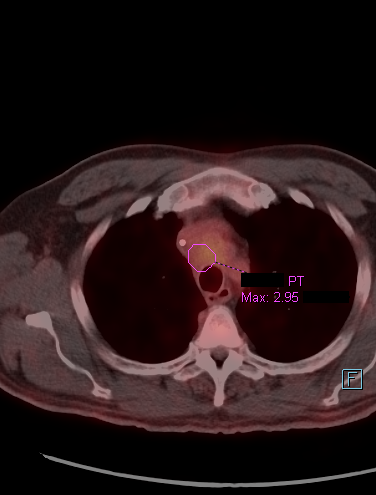

[24 of 25 positions shown; findings below may reference images not displayed]

FINDINGS: Mediastinal blood pool activity: SUV max

Liver activity: SUV max

NECK: No hypermetabolic lymph nodes in the neck.

Incidental CT findings: none

CHEST: Minimal vague residual soft tissue density noted in the upper
right mediastinum with SUV max of 2.73. No residual bulky
hypermetabolic lymphadenopathy. No residual hypermetabolic axillary
lymph nodes.

Incidental CT findings: No pulmonary lesions or acute pulmonary
findings.

ABDOMEN/PELVIS: Interval complete resolution of the hypermetabolic
splenic lesions. Diffuse uptake in the stomach is also largely
resolved. The retroperitoneal lymphadenopathy has resolved. No
residual measurable lymphadenopathy or residual hypermetabolism.

Minimal residual soft tissue density in the right pelvic
sidewall/obturator region measuring 8.5 mm. This previously measured
3.5 cm. No hypermetabolism.

Resolution of inguinal lymphadenopathy. No residual measurable lymph
nodes or hypermetabolism.

Incidental CT findings: Stable age advanced vascular calcifications.

SKELETON: No findings for osseous lymphoma.

On the prior study the patient had a soft tissue mass posterior to
the right femur in the anterior aspect of the gluteus maximus
muscle. This is no longer evident. No residual measurable mass and
no hypermetabolism.

Incidental CT findings: none
IMPRESSION: 1. PET-CT findings demonstrate an excellent response to treatment
with complete or near complete metabolic response. The only residual
disease is in the anterior upper right mediastinum. This is slightly
greater than the mediastinal background but less than the liver
background ([HOSPITAL] 3). All of the other areas of lymphoma seen on
the prior study are now [HOSPITAL] 1.
2. Stable age advanced vascular calcifications.

## 2020-09-17 IMAGING — RF DG FLUORO GUIDE SPINAL/SI JT INJ*L*
1 series · 1 of 1 positions shown · non-contrast
Comparison: none

CLINICAL DATA: Lymphoma, intrathecal methotrexate

[Series 3: cp_standard · 0.26mm/px · 1 of 1 slices shown]
[im 1/1]
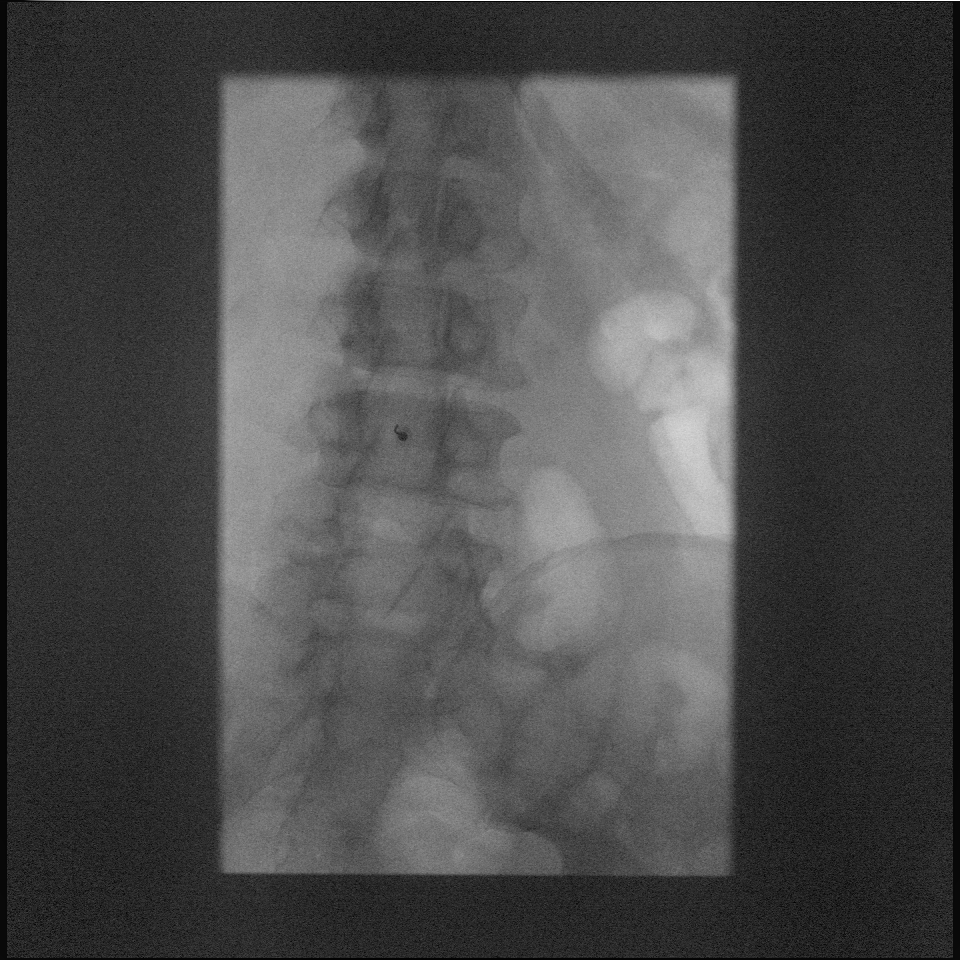

[1 of 1 positions shown; findings below may reference images not displayed]

EXAM:
DIAGNOSTIC LUMBAR PUNCTURE UNDER FLUOROSCOPIC GUIDANCE

FLUOROSCOPY TIME:  Fluoroscopy Time:  0.3 minute

Radiation Exposure Index (if provided by the fluoroscopic device):
2.5 mGy

Number of Acquired Spot Images: 0

PROCEDURE:
Informed consent was obtained from the patient prior to the
procedure, including potential complications of headache, allergy,
and pain. With the patient prone, the lower back was prepped with
Betadine. 1% Lidocaine was used for local anesthesia. Lumbar
puncture was performed at the L3-4 level using a 22 gauge needle
with return of clear CSF. Subsequently, 12 mg of methotrexate in 5
mL was injected slowly into the thecal sac. The patient tolerated
the procedure well and there were no apparent complications.
IMPRESSION: Successful fluoroscopic guided lumbar puncture for injection of
intrathecal methotrexate.

## 2020-09-17 MED ORDER — FLUDEOXYGLUCOSE F - 18 (FDG) INJECTION
10.2400 | Freq: Once | INTRAVENOUS | Status: AC | PRN
  Administered 2020-09-17: 10.24 via INTRAVENOUS

## 2020-09-17 MED ORDER — METHOTREXATE SODIUM CHEMO INJECTION (PF) 50 MG/2ML
Freq: Once | INTRAMUSCULAR | Status: AC
Start: 2020-09-17 — End: 2020-09-17
  Filled 2020-09-17: qty 0.48

## 2020-09-17 MED ORDER — ACETAMINOPHEN 500 MG PO TABS
1000.0000 mg | ORAL_TABLET | Freq: Four times a day (QID) | ORAL | Status: DC | PRN
  Filled 2020-09-17: qty 2

## 2020-09-17 MED ORDER — LIDOCAINE HCL (PF) 1 % IJ SOLN
5.0000 mL | Freq: Once | INTRAMUSCULAR | Status: AC
  Administered 2020-09-17: 5 mL
  Filled 2020-09-17: qty 5

## 2020-09-22 ENCOUNTER — Inpatient Hospital Stay: Payer: Medicaid Other

## 2020-09-22 ENCOUNTER — Other Ambulatory Visit: Payer: Self-pay

## 2020-09-22 DIAGNOSIS — C8510 Unspecified B-cell lymphoma, unspecified site: Secondary | ICD-10-CM

## 2020-09-22 LAB — COMPREHENSIVE METABOLIC PANEL
ALT: 23 U/L (ref 0–44)
AST: 19 U/L (ref 15–41)
Albumin: 4.1 g/dL (ref 3.5–5.0)
Alkaline Phosphatase: 57 U/L (ref 38–126)
Anion gap: 9 (ref 5–15)
BUN: 18 mg/dL (ref 8–23)
CO2: 27 mmol/L (ref 22–32)
Calcium: 9.8 mg/dL (ref 8.9–10.3)
Chloride: 102 mmol/L (ref 98–111)
Creatinine, Ser: 1.05 mg/dL (ref 0.61–1.24)
GFR, Estimated: >60 mL/min (ref >60)
Glucose, Bld: 99 mg/dL (ref 70–99)
Potassium: 4.3 mmol/L (ref 3.5–5.1)
Sodium: 138 mmol/L (ref 135–145)
Total Bilirubin: 0.4 mg/dL (ref 0.3–1.2)
Total Protein: 6.6 g/dL (ref 6.5–8.1)

## 2020-09-22 LAB — CBC WITH DIFFERENTIAL/PLATELET
Abs Immature Granulocytes: 0 10*3/uL (ref 0.00–0.07)
Basophils Absolute: 0 10*3/uL (ref 0.0–0.1)
Basophils Relative: 2 %
Eosinophils Absolute: 0.1 10*3/uL (ref 0.0–0.5)
Eosinophils Relative: 4 %
HCT: 31.1 % — ABNORMAL LOW (ref 39.0–52.0)
Hemoglobin: 10.9 g/dL — ABNORMAL LOW (ref 13.0–17.0)
Immature Granulocytes: 0 %
Lymphocytes Relative: 50 %
Lymphs Abs: 0.9 10*3/uL (ref 0.7–4.0)
MCH: 31.2 pg (ref 26.0–34.0)
MCHC: 35 g/dL (ref 30.0–36.0)
MCV: 89.1 fL (ref 80.0–100.0)
Monocytes Absolute: 0.1 10*3/uL (ref 0.1–1.0)
Monocytes Relative: 8 %
Neutro Abs: 0.6 10*3/uL — ABNORMAL LOW (ref 1.7–7.7)
Neutrophils Relative %: 36 %
Platelets: 122 10*3/uL — ABNORMAL LOW (ref 150–400)
RBC: 3.49 MIL/uL — ABNORMAL LOW (ref 4.22–5.81)
RDW: 15.8 % — ABNORMAL HIGH (ref 11.5–15.5)
WBC: 1.7 10*3/uL — ABNORMAL LOW (ref 4.0–10.5)
nRBC: 0 % (ref 0.0–0.2)

## 2020-09-25 ENCOUNTER — Inpatient Hospital Stay: Payer: Medicaid Other | Admitting: Nurse Practitioner

## 2020-09-25 ENCOUNTER — Inpatient Hospital Stay: Payer: Medicaid Other

## 2020-09-25 ENCOUNTER — Other Ambulatory Visit: Payer: Self-pay

## 2020-09-25 DIAGNOSIS — C8510 Unspecified B-cell lymphoma, unspecified site: Secondary | ICD-10-CM

## 2020-09-25 LAB — CBC WITH DIFFERENTIAL/PLATELET
Abs Immature Granulocytes: 0 10*3/uL (ref 0.00–0.07)
Basophils Absolute: 0 10*3/uL (ref 0.0–0.1)
Basophils Relative: 2 %
Eosinophils Absolute: 0.1 10*3/uL (ref 0.0–0.5)
Eosinophils Relative: 4 %
HCT: 32.6 % — ABNORMAL LOW (ref 39.0–52.0)
Hemoglobin: 11.4 g/dL — ABNORMAL LOW (ref 13.0–17.0)
Immature Granulocytes: 0 %
Lymphocytes Relative: 49 %
Lymphs Abs: 1 10*3/uL (ref 0.7–4.0)
MCH: 31.6 pg (ref 26.0–34.0)
MCHC: 35 g/dL (ref 30.0–36.0)
MCV: 90.3 fL (ref 80.0–100.0)
Monocytes Absolute: 0.3 10*3/uL (ref 0.1–1.0)
Monocytes Relative: 15 %
Neutro Abs: 0.6 10*3/uL — ABNORMAL LOW (ref 1.7–7.7)
Neutrophils Relative %: 30 %
Platelets: 148 10*3/uL — ABNORMAL LOW (ref 150–400)
RBC: 3.61 MIL/uL — ABNORMAL LOW (ref 4.22–5.81)
RDW: 16.2 % — ABNORMAL HIGH (ref 11.5–15.5)
Smear Review: NORMAL
WBC: 2 10*3/uL — ABNORMAL LOW (ref 4.0–10.5)
nRBC: 0 % (ref 0.0–0.2)

## 2020-09-25 LAB — COMPREHENSIVE METABOLIC PANEL
ALT: 25 U/L (ref 0–44)
AST: 23 U/L (ref 15–41)
Albumin: 4.2 g/dL (ref 3.5–5.0)
Alkaline Phosphatase: 61 U/L (ref 38–126)
Anion gap: 9 (ref 5–15)
BUN: 13 mg/dL (ref 8–23)
CO2: 23 mmol/L (ref 22–32)
Calcium: 9.2 mg/dL (ref 8.9–10.3)
Chloride: 105 mmol/L (ref 98–111)
Creatinine, Ser: 1.04 mg/dL (ref 0.61–1.24)
GFR, Estimated: >60 mL/min (ref >60)
Glucose, Bld: 112 mg/dL — ABNORMAL HIGH (ref 70–99)
Potassium: 3.9 mmol/L (ref 3.5–5.1)
Sodium: 137 mmol/L (ref 135–145)
Total Bilirubin: 0.3 mg/dL (ref 0.3–1.2)
Total Protein: 6.7 g/dL (ref 6.5–8.1)

## 2020-09-29 ENCOUNTER — Other Ambulatory Visit: Payer: Self-pay

## 2020-09-29 ENCOUNTER — Encounter: Payer: Self-pay | Admitting: Oncology

## 2020-09-29 ENCOUNTER — Inpatient Hospital Stay: Payer: Medicaid Other

## 2020-09-29 ENCOUNTER — Inpatient Hospital Stay (HOSPITAL_BASED_OUTPATIENT_CLINIC_OR_DEPARTMENT_OTHER): Payer: Self-pay | Admitting: Oncology

## 2020-09-29 VITALS — BP 130/81 | HR 67 | Temp 98.0°F | Resp 16 | Wt 180.0 lb

## 2020-09-29 DIAGNOSIS — C8510 Unspecified B-cell lymphoma, unspecified site: Secondary | ICD-10-CM

## 2020-09-29 DIAGNOSIS — Z5111 Encounter for antineoplastic chemotherapy: Secondary | ICD-10-CM

## 2020-09-29 LAB — CBC WITH DIFFERENTIAL/PLATELET
Abs Immature Granulocytes: 0.01 10*3/uL (ref 0.00–0.07)
Basophils Absolute: 0 10*3/uL (ref 0.0–0.1)
Basophils Relative: 1 %
Eosinophils Absolute: 0.1 10*3/uL (ref 0.0–0.5)
Eosinophils Relative: 2 %
HCT: 34.2 % — ABNORMAL LOW (ref 39.0–52.0)
Hemoglobin: 11.7 g/dL — ABNORMAL LOW (ref 13.0–17.0)
Immature Granulocytes: 0 %
Lymphocytes Relative: 36 %
Lymphs Abs: 1.1 10*3/uL (ref 0.7–4.0)
MCH: 31.2 pg (ref 26.0–34.0)
MCHC: 34.2 g/dL (ref 30.0–36.0)
MCV: 91.2 fL (ref 80.0–100.0)
Monocytes Absolute: 0.7 10*3/uL (ref 0.1–1.0)
Monocytes Relative: 24 %
Neutro Abs: 1.1 10*3/uL — ABNORMAL LOW (ref 1.7–7.7)
Neutrophils Relative %: 37 %
Platelets: 318 10*3/uL (ref 150–400)
RBC: 3.75 MIL/uL — ABNORMAL LOW (ref 4.22–5.81)
RDW: 16.5 % — ABNORMAL HIGH (ref 11.5–15.5)
WBC: 3 10*3/uL — ABNORMAL LOW (ref 4.0–10.5)
nRBC: 0 % (ref 0.0–0.2)

## 2020-09-29 LAB — COMPREHENSIVE METABOLIC PANEL
ALT: 22 U/L (ref 0–44)
AST: 27 U/L (ref 15–41)
Albumin: 3.9 g/dL (ref 3.5–5.0)
Alkaline Phosphatase: 63 U/L (ref 38–126)
Anion gap: 7 (ref 5–15)
BUN: 11 mg/dL (ref 8–23)
CO2: 24 mmol/L (ref 22–32)
Calcium: 9.1 mg/dL (ref 8.9–10.3)
Chloride: 105 mmol/L (ref 98–111)
Creatinine, Ser: 1.05 mg/dL (ref 0.61–1.24)
GFR, Estimated: >60 mL/min (ref >60)
Glucose, Bld: 113 mg/dL — ABNORMAL HIGH (ref 70–99)
Potassium: 3.4 mmol/L — ABNORMAL LOW (ref 3.5–5.1)
Sodium: 136 mmol/L (ref 135–145)
Total Bilirubin: 0.6 mg/dL (ref 0.3–1.2)
Total Protein: 6.6 g/dL (ref 6.5–8.1)

## 2020-09-29 LAB — LACTATE DEHYDROGENASE: LDH: 150 U/L (ref 98–192)

## 2020-09-29 MED ORDER — HEPARIN SOD (PORK) LOCK FLUSH 100 UNIT/ML IV SOLN
500.0000 [IU] | Freq: Once | INTRAVENOUS | Status: AC
  Administered 2020-09-29: 500 [IU] via INTRAVENOUS
  Filled 2020-09-29: qty 5

## 2020-09-29 NOTE — Progress Notes (Signed)
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  This was followed by a PET CT scan which showed extensive FDG avid adenopathy in the neck chest abdomen and pelvis.  FDG avid splenic lesions.  Solitary intramuscular FDG avid lesion in the right biceps femoris muscle.   Supraclavicular excisional lymph node biopsy showed high-grade B-cell lymphoma germinal center type Ki-67 greater than 95%.  FISH testing was positive for Bcl-2 BCL6 and MYC consistent with triple hit lymphoma.  By NCCN IPI score would be 4 based on age and elevated LDH and stage IV (intramuscular biceps femoris lesion) which puts him in the high intermediate risk group. 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   Patient received RCHOP for cycle 1 with plans for [facility] with cycle 2. IT MTX for CNS prophylaxis.   MRI brain negative for lymphoma.  Chemotherapy was not being able to escalated upwards due to neutropenia which has persisted to 3 weeks and he has been receiving treatment every 4 weeks    Interval history-patient reports doing well  and denies any specific complaints at this time.  Weight has  remained stable overall in the last 2 months.  Denies any fever  ECOG PS- 1 Pain scale- 0   Review of systems- Review of Systems  Constitutional:  Negative for chills, fever, malaise/fatigue and weight loss.  HENT:  Negative for congestion, ear discharge and nosebleeds.   Eyes:  Negative for blurred vision.  Respiratory:  Negative for cough, hemoptysis, sputum production, shortness of breath and wheezing.   Cardiovascular:  Negative for chest pain, palpitations, orthopnea and claudication.  Gastrointestinal:  Negative for abdominal pain, blood in stool, constipation, diarrhea, heartburn, melena, nausea and vomiting.  Genitourinary:  Negative for dysuria, flank pain, frequency, hematuria and urgency.  Musculoskeletal:  Negative for back pain, joint pain and myalgias.  Skin:  Negative for rash.  Neurological:  Negative for dizziness, tingling, focal weakness, seizures, weakness and headaches.  Endo/Heme/Allergies:  Does not bruise/bleed easily.  Psychiatric/Behavioral:  Negative for depression and suicidal ideas. The patient does not have insomnia.       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     Past Surgical History:  Procedure Laterality Date   BONE MARROW BIOPSY     COLONOSCOPY     EXCISION MASS NECK Right 06/12/2020   Procedure: EXCISION MASS NECK;  Surgeon: Jules Husbands, MD;  Location: [facility] ORS;  Service: General;  Laterality: Right;   HERNIA REPAIR Right    at age 9-RIH   PORTA CATH INSERTION  PORTACATH PLACEMENT Right 06/24/2020   Procedure: INSERTION PORT-A-CATH;  Surgeon: Jules Husbands, MD;  Location: [facility] ORS;  Service: General;  Laterality: Right;    Social History   Socioeconomic History   Marital status: Single    Spouse name: Not on file   Number of children: Not on file   Years of education: Not on file   Highest  education level: Not on file  Occupational History   Not on file  Tobacco Use   Smoking status: Former    Types: Cigarettes    Quit date: 06/13/2020    Years since quitting: 0.2   Smokeless tobacco: Never  Vaping Use   Vaping Use: Never used  Substance and Sexual Activity   Alcohol use: Not Currently   Drug use: Not Currently    Types: Marijuana   Sexual activity: Not Currently  Other Topics Concern   Not on file  Social History Narrative   Has daughter and grandson in the home   Social Determinants of Health   Financial Resource Strain: Not on file  Food Insecurity: Not on file  Transportation Needs: Not on file  Physical Activity: Not on file  Stress: Not on file  Social Connections: Not on file  Intimate Partner Violence: Not on file    Family History  Problem Relation Age of Onset   Anemia Mother    Hypertension Mother    Goiter Mother    Cancer Father    Cancer Sister    Multiple sclerosis Sister      Current Outpatient Medications:    atenolol (TENORMIN) 25 MG tablet, Take 25 mg by mouth daily., Disp: , Rfl:    lisinopril (ZESTRIL) 10 MG tablet, Take 10 mg by mouth daily., Disp: , Rfl:    predniSONE (DELTASONE) 50 MG tablet, Take 2 tablets each day with food early in am for 5 days while on chemo treatment, Disp: 10 tablet, Rfl: 0   VITAMIN D PO, Take 1 capsule by mouth daily., Disp: , Rfl:    HYDROcodone-acetaminophen (NORCO/VICODIN) 5-325 MG tablet, Take 1 tablet by mouth every 6 (six) hours as needed for moderate pain. (Patient not taking: No sig reported), Disp: 15 tablet, Rfl: 0   Tenofovir Alafenamide Fumarate 25 MG TABS, Take 1 tablet (25 mg total) by mouth daily. (Patient not taking: Reported on 09/29/2020), Disp: 30 tablet, Rfl: 3 No current facility-administered medications for this visit.  Facility-Administered Medications Ordered in Other Visits:    0.9 %  sodium chloride infusion, , Intravenous, Continuous, Sindy Guadeloupe, MD, Stopped at 08/12/20  1310   0.9 %  sodium chloride infusion, , Intravenous, Continuous, Sindy Guadeloupe, MD, Stopped at 09/08/20 0944   0.9 %  sodium chloride infusion, , Intravenous, Continuous, Sindy Guadeloupe, MD, Stopped at 09/09/20 1507   0.9 %  sodium chloride infusion, , Intravenous, Continuous, Cammie Sickle, MD, Stopped at 09/11/20 1502   heparin lock flush 100 unit/mL, 500 Units, Intravenous, Once, Sindy Guadeloupe, MD   methotrexate (PF) 12 mg in sodium chloride (PF) 0.9 % INTRATHECAL chemo injection, , Intrathecal, Once, Sindy Guadeloupe, MD   sodium chloride flush (NS) 0.9 % injection 10 mL, 10 mL, Intravenous, PRN, Sindy Guadeloupe, MD, 10 mL at 08/13/20 1510  Physical exam:  Vitals:   09/29/20 0901  BP: 130/81  Pulse: 67  Resp: 16  Temp: 98 F (36.7 C)  Weight: 180 lb (81.6 kg)   Physical Exam Constitutional:  General: He is not in acute distress. Cardiovascular:     Rate and Rhythm: Normal rate and regular rhythm.     Heart sounds: Normal heart sounds.  Pulmonary:     Effort: Pulmonary effort is normal.     Breath sounds: Normal breath sounds.  Abdominal:     General: Bowel sounds are normal.     Palpations: Abdomen is soft.  Lymphadenopathy:     Comments: No palpable cervical, supraclavicular, axillary or inguinal adenopathy    Skin:    General: Skin is warm and dry.  Neurological:     Mental Status: He is alert and oriented to person, place, and time.     CMP Latest Ref Rng & Units 09/29/2020  Glucose 70 - 99 mg/dL 113(H)  BUN 8 - 23 mg/dL 11  Creatinine 0.61 - 1.24 mg/dL 1.05  Sodium 135 - 145 mmol/L 136  Potassium 3.5 - 5.1 mmol/L 3.4(L)  Chloride 98 - 111 mmol/L 105  CO2 22 - 32 mmol/L 24  Calcium 8.9 - 10.3 mg/dL 9.1  Total Protein 6.5 - 8.1 g/dL 6.6  Total Bilirubin 0.3 - 1.2 mg/dL 0.6  Alkaline Phos 38 - 126 U/L 63  AST 15 - 41 U/L 27  ALT 0 - 44 U/L 22   CBC Latest Ref Rng & Units 09/29/2020  WBC 4.0 - 10.5 K/uL 3.0(L)  Hemoglobin 13.0 - 17.0 g/dL  11.7(L)  Hematocrit 39.0 - 52.0 % 34.2(L)  Platelets 150 - 400 K/uL 318    No images are attached to the encounter.  NM PET Image Restag (PS) Skull Base To Thigh  Result Date: 09/17/2020 CLINICAL DATA:  Subsequent treatment strategy for high-grade B-cell lymphoma. Status post 3 cycles of chemotherapy and status post intrathecal administration methotrexate. EXAM: NUCLEAR MEDICINE PET SKULL BASE TO THIGH TECHNIQUE: 10.24 mCi F-18 FDG was injected intravenously. Full-ring PET imaging was performed from the skull base to thigh after the radiotracer. CT data was obtained and used for attenuation correction and anatomic localization. Fasting blood glucose: 83 mg/dl COMPARISON:  PET-CT 05/26/2020 FINDINGS: Mediastinal blood pool activity: SUV max 2.22 Liver activity: SUV max 3.07 NECK: No hypermetabolic lymph nodes in the neck. Incidental CT findings: none CHEST: Minimal vague residual soft tissue density noted in the upper right mediastinum with SUV max of 2.73. No residual bulky hypermetabolic lymphadenopathy. No residual hypermetabolic axillary lymph nodes. Incidental CT findings: No pulmonary lesions or acute pulmonary findings. ABDOMEN/PELVIS: Interval complete resolution of the hypermetabolic splenic lesions. Diffuse uptake in the stomach is also largely resolved. The retroperitoneal lymphadenopathy has resolved. No residual measurable lymphadenopathy or residual hypermetabolism. Minimal residual soft tissue density in the right pelvic sidewall/obturator region measuring 8.5 mm. This previously measured 3.5 cm. No hypermetabolism. Resolution of inguinal lymphadenopathy. No residual measurable lymph nodes or hypermetabolism. Incidental CT findings: Stable age advanced vascular calcifications. SKELETON: No findings for osseous lymphoma. On the prior study the patient had a soft tissue mass posterior to the right femur in the anterior aspect of the gluteus maximus muscle. This is no longer evident. No residual  measurable mass and no hypermetabolism. Incidental CT findings: none IMPRESSION: 1. PET-CT findings demonstrate an excellent response to treatment with complete or near complete metabolic response. The only residual disease is in the anterior upper right mediastinum. This is slightly greater than the mediastinal background but less than the liver background (Deauville 3). All of the other areas of lymphoma seen on the prior study are now Deauville 1. 2. Stable age advanced  vascular calcifications. Electronically Signed   By: Marijo Sanes M.D.   On: 09/17/2020 13:29   DG FLUORO GUIDED LOC OF NEEDLE/CATH TIP FOR SPINAL INJECT LT  Result Date: 09/17/2020 CLINICAL DATA:  Lymphoma, intrathecal methotrexate EXAM: DIAGNOSTIC LUMBAR PUNCTURE UNDER FLUOROSCOPIC GUIDANCE FLUOROSCOPY TIME:  Fluoroscopy Time:  0.3 minute Radiation Exposure Index (if provided by the fluoroscopic device): 2.5 mGy Number of Acquired Spot Images: 0 PROCEDURE: Informed consent was obtained from the patient prior to the procedure, including potential complications of headache, allergy, and pain. With the patient prone, the lower back was prepped with Betadine. 1% Lidocaine was used for local anesthesia. Lumbar puncture was performed at the L3-4 level using a 22 gauge needle with return of clear CSF. Subsequently, 12 mg of methotrexate in 5 mL was injected slowly into the thecal sac. The patient tolerated the procedure well and there were no apparent complications. IMPRESSION: Successful fluoroscopic guided lumbar puncture for injection of intrathecal methotrexate. Electronically Signed   By: Kathreen Devoid   On: 09/17/2020 17:11     Assessment and plan- Patient is a 60 y.o. male with stage IV triple hit high-grade B-cell lymphoma.  He is here for on treatment assessment prior to cycle 3 of dose adjusted R-EPOCH chemotherapy  Patient had weekly CBCs checked between cycle 2 and cycle 3 of chemotherapyAnd his nadir was not less than 0.5.  However  at 3 weeks from his previous cycle his [place] is still 1.1.  I am holding off on chemotherapy today and postpone it by 1 week.  Patient did have hospitalization for neutropenic fever after cycle 1.  He will return to clinic in 1 week with port labs CBC with differential, CMP and proceed with outpatient R-EPOCH chemotherapy.  3 to 4 days following his chemotherapy he will receive intrathecal methotrexate  Check CBC with differential twice a week 2 and 3 weeks from now and I will see him back in4 weeks for cycle 4 of R-EPOCH chemotherapy  I have reviewed PET/CT scan images independently and discussed findings with the patient which showed excellent response to treatment so far.  Vague residual soft tissue density in the right mediastinum with an SUV of 2.7.  No residual bulky hypermetabolic lymphadenopathy noted in the chest or axillary lymph nodes or neck.  Resolution of hypermetabolic splenic lesions.  Diffuse uptake in the stomach as well as retroperitoneal adenopathy has resolved.  Soft tissue density in the right pelvic obturator region now measuring 8.5 mm compared to 3.5 cm previously.  Resolution of inguinal adenopathy.  [place]ville 1.  Plan is to complete 6 cycles of chemotherapy with this being cycle 4 given that he had R-CHOP chemotherapy for cycle 1.  I will get a repeat PET scan after 6 cycles of chemotherapy and referred him to [facility] at that time to see if they would consider him for an autologous stem cell transplant given that he has triple hit high-grade B-cell lymphoma.   Visit Diagnosis 1. Encounter for antineoplastic chemotherapy   2. High grade B-cell lymphoma ([place])      Dr. Randa Evens, MD, MPH [facility] [phone] 09/29/2020 12:44 PM

## 2020-09-29 NOTE — Progress Notes (Signed)
Pt doing good except for the numbness and tingling of feet, mostly bothers him at night

## 2020-09-30 ENCOUNTER — Inpatient Hospital Stay: Payer: Medicaid Other

## 2020-10-01 ENCOUNTER — Inpatient Hospital Stay: Payer: Medicaid Other

## 2020-10-02 ENCOUNTER — Inpatient Hospital Stay: Payer: Medicaid Other

## 2020-10-03 ENCOUNTER — Inpatient Hospital Stay: Payer: Medicaid Other

## 2020-10-05 ENCOUNTER — Other Ambulatory Visit: Payer: Self-pay | Admitting: *Deleted

## 2020-10-05 DIAGNOSIS — C8510 Unspecified B-cell lymphoma, unspecified site: Secondary | ICD-10-CM

## 2020-10-05 NOTE — Progress Notes (Signed)
Arranging for intrathecal methotrexate

## 2020-10-06 ENCOUNTER — Inpatient Hospital Stay: Payer: Medicaid Other

## 2020-10-06 ENCOUNTER — Other Ambulatory Visit: Payer: Self-pay | Admitting: *Deleted

## 2020-10-06 ENCOUNTER — Other Ambulatory Visit: Payer: Self-pay

## 2020-10-06 ENCOUNTER — Inpatient Hospital Stay: Payer: Medicaid Other | Admitting: Oncology

## 2020-10-06 ENCOUNTER — Other Ambulatory Visit: Payer: Self-pay | Admitting: Oncology

## 2020-10-06 VITALS — BP 132/76 | HR 59 | Temp 97.2°F | Resp 18

## 2020-10-06 DIAGNOSIS — C8510 Unspecified B-cell lymphoma, unspecified site: Secondary | ICD-10-CM

## 2020-10-06 DIAGNOSIS — Z95828 Presence of other vascular implants and grafts: Secondary | ICD-10-CM

## 2020-10-06 LAB — CBC WITH DIFFERENTIAL/PLATELET
Abs Immature Granulocytes: 0.02 10*3/uL (ref 0.00–0.07)
Basophils Absolute: 0 10*3/uL (ref 0.0–0.1)
Basophils Relative: 1 %
Eosinophils Absolute: 0.1 10*3/uL (ref 0.0–0.5)
Eosinophils Relative: 3 %
HCT: 35.5 % — ABNORMAL LOW (ref 39.0–52.0)
Hemoglobin: 12.1 g/dL — ABNORMAL LOW (ref 13.0–17.0)
Immature Granulocytes: 1 %
Lymphocytes Relative: 43 %
Lymphs Abs: 1.7 10*3/uL (ref 0.7–4.0)
MCH: 31.6 pg (ref 26.0–34.0)
MCHC: 34.1 g/dL (ref 30.0–36.0)
MCV: 92.7 fL (ref 80.0–100.0)
Monocytes Absolute: 0.7 10*3/uL (ref 0.1–1.0)
Monocytes Relative: 18 %
Neutro Abs: 1.4 10*3/uL — ABNORMAL LOW (ref 1.7–7.7)
Neutrophils Relative %: 34 %
Platelets: 449 10*3/uL — ABNORMAL HIGH (ref 150–400)
RBC: 3.83 MIL/uL — ABNORMAL LOW (ref 4.22–5.81)
RDW: 16.2 % — ABNORMAL HIGH (ref 11.5–15.5)
WBC: 4 10*3/uL (ref 4.0–10.5)
nRBC: 0 % (ref 0.0–0.2)

## 2020-10-06 LAB — COMPREHENSIVE METABOLIC PANEL
ALT: 18 U/L (ref 0–44)
AST: 24 U/L (ref 15–41)
Albumin: 4.1 g/dL (ref 3.5–5.0)
Alkaline Phosphatase: 62 U/L (ref 38–126)
Anion gap: 6 (ref 5–15)
BUN: 14 mg/dL (ref 8–23)
CO2: 26 mmol/L (ref 22–32)
Calcium: 9.2 mg/dL (ref 8.9–10.3)
Chloride: 106 mmol/L (ref 98–111)
Creatinine, Ser: 0.96 mg/dL (ref 0.61–1.24)
GFR, Estimated: >60 mL/min (ref >60)
Glucose, Bld: 88 mg/dL (ref 70–99)
Potassium: 3.9 mmol/L (ref 3.5–5.1)
Sodium: 138 mmol/L (ref 135–145)
Total Bilirubin: 0.7 mg/dL (ref 0.3–1.2)
Total Protein: 6.7 g/dL (ref 6.5–8.1)

## 2020-10-06 MED ORDER — PREDNISONE 50 MG PO TABS: ORAL_TABLET | ORAL | 0 refills | Status: DC

## 2020-10-06 MED ORDER — SODIUM CHLORIDE 0.9% FLUSH
10.0000 mL | Freq: Once | INTRAVENOUS | Status: AC
  Administered 2020-10-06: 10 mL via INTRAVENOUS
  Filled 2020-10-06: qty 10

## 2020-10-06 MED ORDER — SODIUM CHLORIDE 0.9 % IV SOLN: 375.0000 mg/m2 | Freq: Once | INTRAVENOUS | Status: DC

## 2020-10-06 MED ORDER — SODIUM CHLORIDE 0.9 % IV SOLN
375.0000 mg/m2 | Freq: Once | INTRAVENOUS | Status: AC
  Administered 2020-10-06: 800 mg via INTRAVENOUS
  Filled 2020-10-06: qty 50

## 2020-10-06 MED ORDER — SODIUM CHLORIDE 0.9 % IV SOLN
INTRAVENOUS | Status: DC
  Filled 2020-10-06: qty 250

## 2020-10-06 MED ORDER — HEPARIN SOD (PORK) LOCK FLUSH 100 UNIT/ML IV SOLN
500.0000 [IU] | Freq: Once | INTRAVENOUS | Status: DC
  Filled 2020-10-06: qty 5

## 2020-10-06 MED ORDER — ACETAMINOPHEN 325 MG PO TABS
650.0000 mg | ORAL_TABLET | Freq: Once | ORAL | Status: AC
  Administered 2020-10-06: 650 mg via ORAL
  Filled 2020-10-06: qty 2

## 2020-10-06 MED ORDER — VINCRISTINE SULFATE CHEMO INJECTION 1 MG/ML
Freq: Once | INTRAVENOUS | Status: AC
  Filled 2020-10-06: qty 10

## 2020-10-06 MED ORDER — DIPHENHYDRAMINE HCL 25 MG PO CAPS
50.0000 mg | ORAL_CAPSULE | Freq: Once | ORAL | Status: AC
  Administered 2020-10-06: 50 mg via ORAL
  Filled 2020-10-06: qty 2

## 2020-10-06 MED ORDER — SODIUM CHLORIDE 0.9 % IV SOLN
Freq: Once | INTRAVENOUS | Status: AC
  Administered 2020-10-06: 8 mg via INTRAVENOUS
  Filled 2020-10-06: qty 4

## 2020-10-06 NOTE — Patient Instructions (Signed)
[facility]  Discharge Instructions: Thank you for choosing [facility] to provide your oncology and hematology care.  If you have a lab appointment with the [facility], please go directly to the [facility] and check in at the registration area.  Wear comfortable clothing and clothing appropriate for easy access to any Portacath or PICC line.   We strive to give you quality time with your provider. You may need to reschedule your appointment if you arrive late (15 or more minutes).  Arriving late affects you and other patients whose appointments are after yours.  Also, if you miss three or more appointments without notifying the office, you may be dismissed from the clinic at the provider's discretion.      For prescription refill requests, have your pharmacy contact our office and allow 72 hours for refills to be completed.    Today you received the following chemotherapy and/or immunotherapy agents Adriamycin, Vepsid, Vincristine, Rituxan      To help prevent nausea and vomiting after your treatment, we encourage you to take your nausea medication as directed.  BELOW ARE SYMPTOMS THAT SHOULD BE REPORTED IMMEDIATELY: *FEVER GREATER THAN 100.4 F (38 C) OR HIGHER *CHILLS OR SWEATING *NAUSEA AND VOMITING THAT IS NOT CONTROLLED WITH YOUR NAUSEA MEDICATION *UNUSUAL SHORTNESS OF BREATH *UNUSUAL BRUISING OR BLEEDING *URINARY PROBLEMS (pain or burning when urinating, or frequent urination) *BOWEL PROBLEMS (unusual diarrhea, constipation, pain near the anus) TENDERNESS IN MOUTH AND THROAT WITH OR WITHOUT PRESENCE OF ULCERS (sore throat, sores in mouth, or a toothache) UNUSUAL RASH, SWELLING OR PAIN  UNUSUAL VAGINAL DISCHARGE OR ITCHING   Items with * indicate a potential emergency and should be followed up as soon as possible or go to the Emergency Department if any problems should occur.  Please show the CHEMOTHERAPY ALERT CARD or  IMMUNOTHERAPY ALERT CARD at check-in to the Emergency Department and triage nurse.  Should you have questions after your visit or need to cancel or reschedule your appointment, please contact [facility]  [phone] and follow the prompts.  Office hours are 8:00 a.m. to 4:30 p.m. Monday - Friday. Please note that voicemails left after 4:00 p.m. may not be returned until the following business day.  We are closed weekends and major holidays. You have access to a nurse at all times for urgent questions. Please call the main number to the clinic [phone] and follow the prompts.  For any non-urgent questions, you may also contact your provider using MyChart. We now offer e-Visits for anyone 73 and older to request care online for non-urgent symptoms. For details visit [email or web address].   Also download the MyChart app! Go to the app store, search "MyChart", open the app, select [facility], and log in with your MyChart username and password.  Due to Covid, a mask is required upon entering the hospital/clinic. If you do not have a mask, one will be given to you upon arrival. For doctor visits, patients may have 1 support person aged 9 or older with them. For treatment visits, patients cannot have anyone with them due to current Covid guidelines and our immunocompromised population.   Doxorubicin injection What is this medication? DOXORUBICIN (dox oh ROO bi sin) is a chemotherapy drug. It is used to treat many kinds of cancer like leukemia, lymphoma, neuroblastoma, sarcoma, and Wilms' tumor. It is also used to treat bladder cancer, breast cancer, lungcancer, ovarian cancer, stomach cancer, and thyroid cancer. This  medicine may be used for other purposes; ask your health care provider orpharmacist if you have questions. COMMON BRAND NAME(S): Adriamycin, Adriamycin PFS, Adriamycin RDF, Rubex What should I tell my care team before I take this medication? They  need to know if you have any of these conditions: heart disease history of low blood counts caused by a medicine liver disease recent or ongoing radiation therapy an unusual or allergic reaction to doxorubicin, other chemotherapy agents, other medicines, foods, dyes, or preservatives pregnant or trying to get pregnant breast-feeding How should I use this medication? This drug is given as an infusion into a vein. It is administered in a hospital or clinic by a specially trained health care professional. If you have pain, swelling, burning or any unusual feeling around the site of your injection,tell your health care professional right away. Talk to your pediatrician regarding the use of this medicine in children.Special care may be needed. Overdosage: If you think you have taken too much of this medicine contact apoison control center or emergency room at once. NOTE: This medicine is only for you. Do not share this medicine with others. What if I miss a dose? It is important not to miss your dose. Call your doctor or health careprofessional if you are unable to keep an appointment. What may interact with this medication? This medicine may interact with the following medications: 6-mercaptopurine paclitaxel phenytoin St. John's Wort trastuzumab verapamil This list may not describe all possible interactions. Give your health care provider a list of all the medicines, herbs, non-prescription drugs, or dietary supplements you use. Also tell them if you smoke, drink alcohol, or use illegaldrugs. Some items may interact with your medicine. What should I watch for while using this medication? This drug may make you feel generally unwell. This is not uncommon, as chemotherapy can affect healthy cells as well as cancer cells. Report any side effects. Continue your course of treatment even though you feel ill unless yourdoctor tells you to stop. There is a maximum amount of this medicine you should  receive throughout your life. The amount depends on the medical condition being treated and your overall health. Your doctor will watch how much of this medicine you receive inyour lifetime. Tell your doctor if you have taken this medicine before. You may need blood work done while you are taking this medicine. Your urine may turn red for a few days after your dose. This is not blood. Ifyour urine is dark or brown, call your doctor. In some cases, you may be given additional medicines to help with side effects.Follow all directions for their use. Call your doctor or health care professional for advice if you get a fever, chills or sore throat, or other symptoms of a cold or flu. Do not treat yourself. This drug decreases your body's ability to fight infections. Try toavoid being around people who are sick. This medicine may increase your risk to bruise or bleed. Call your doctor orhealth care professional if you notice any unusual bleeding. Talk to your doctor about your risk of cancer. You may be more at risk forcertain types of cancers if you take this medicine. Do not become pregnant while taking this medicine or for 6 months after stopping it. Women should inform their doctor if they wish to become pregnant or think they might be pregnant. Men should not father a child while taking this medicine and for 6 months after stopping it. There is a potential for serious side effects to  an unborn child. Talk to your health care professional or pharmacist for more information. Do not breast-feed an infant while takingthis medicine. This medicine has caused ovarian failure in some women and reduced sperm counts in some men This medicine may interfere with the ability to have a child. Talk with your doctor or health care professional if you are concerned about yourfertility. This medicine may cause a decrease in Co-Enzyme Q-10. You should make sure that you get enough Co-Enzyme Q-10 while you are taking this  medicine. Discuss thefoods you eat and the vitamins you take with your health care professional. What side effects may I notice from receiving this medication? Side effects that you should report to your doctor or health care professionalas soon as possible: allergic reactions like skin rash, itching or hives, swelling of the face, lips, or tongue breathing problems chest pain fast or irregular heartbeat low blood counts - this medicine may decrease the number of white blood cells, red blood cells and platelets. You may be at increased risk for infections and bleeding. pain, redness, or irritation at site where injected signs of infection - fever or chills, cough, sore throat, pain or difficulty passing urine signs of decreased platelets or bleeding - bruising, pinpoint red spots on the skin, black, tarry stools, blood in the urine swelling of the ankles, feet, hands tiredness weakness Side effects that usually do not require medical attention (report to yourdoctor or health care professional if they continue or are bothersome): diarrhea hair loss mouth sores nail discoloration or damage nausea red colored urine vomiting This list may not describe all possible side effects. Call your doctor for medical advice about side effects. You may report side effects to FDA at1-800-FDA-1088. Where should I keep my medication? This drug is given in a hospital or clinic and will not be stored at home. NOTE: This sheet is a summary. It may not cover all possible information. If you have questions about this medicine, talk to your doctor, pharmacist, orhealth care provider.  2022 Elsevier/Gold Standard (2016-10-13 11:01:26)  Etoposide, VP-16 injection What is this medication? ETOPOSIDE, VP-16 (e toe POE side) is a chemotherapy drug. It is used to treattesticular cancer, lung cancer, and other cancers. This medicine may be used for other purposes; ask your health care provider orpharmacist if you  have questions. COMMON BRAND NAME(S): Etopophos, Toposar, VePesid What should I tell my care team before I take this medication? They need to know if you have any of these conditions: infection kidney disease liver disease low blood counts, like low white cell, platelet, or red cell counts an unusual or allergic reaction to etoposide, other medicines, foods, dyes, or preservatives pregnant or trying to get pregnant breast-feeding How should I use this medication? This medicine is for infusion into a vein. It is administered in a hospital orclinic by a specially trained health care professional. Talk to your pediatrician regarding the use of this medicine in children.Special care may be needed. Overdosage: If you think you have taken too much of this medicine contact apoison control center or emergency room at once. NOTE: This medicine is only for you. Do not share this medicine with others. What if I miss a dose? It is important not to miss your dose. Call your doctor or health careprofessional if you are unable to keep an appointment. What may interact with this medication? This medicine may interact with the following medications: warfarin This list may not describe all possible interactions. Give your health care provider  a list of all the medicines, herbs, non-prescription drugs, or dietary supplements you use. Also tell them if you smoke, drink alcohol, or use illegaldrugs. Some items may interact with your medicine. What should I watch for while using this medication? Visit your doctor for checks on your progress. This drug may make you feel generally unwell. This is not uncommon, as chemotherapy can affect healthy cells as well as cancer cells. Report any side effects. Continue your course oftreatment even though you feel ill unless your doctor tells you to stop. In some cases, you may be given additional medicines to help with side effects.Follow all directions for their use. Call  your doctor or health care professional for advice if you get a fever, chills or sore throat, or other symptoms of a cold or flu. Do not treat yourself. This drug decreases your body's ability to fight infections. Try toavoid being around people who are sick. This medicine may increase your risk to bruise or bleed. Call your doctor orhealth care professional if you notice any unusual bleeding. Talk to your doctor about your risk of cancer. You may be more at risk forcertain types of cancers if you take this medicine. Do not become pregnant while taking this medicine or for at least 6 months after stopping it. Women should inform their doctor if they wish to become pregnant or think they might be pregnant. Women of child-bearing potential will need to have a negative pregnancy test before starting this medicine. There is a potential for serious side effects to an unborn child. Talk to your health care professional or pharmacist for more information. Do not breast-feed aninfant while taking this medicine. Men must use a latex condom during sexual contact with a woman while taking this medicine and for at least 4 months after stopping it. A latex condom is needed even if you have had a vasectomy. Contact your doctor right away if your partner becomes pregnant. Do not donate sperm while taking this medicine and for at least 4 months after you stop taking this medicine. Men should inform their doctors if they wish to father a child. This medicine may lower spermcounts. What side effects may I notice from receiving this medication? Side effects that you should report to your doctor or health care professionalas soon as possible: allergic reactions like skin rash, itching or hives, swelling of the face, lips, or tongue low blood counts - this medicine may decrease the number of white blood cells, red blood cells, and platelets. You may be at increased risk for infections and bleeding nausea, vomiting redness,  blistering, peeling or loosening of the skin, including inside the mouth signs and symptoms of infection like fever; chills; cough; sore throat; pain or trouble passing urine signs and symptoms of low red blood cells or anemia such as unusually weak or tired; feeling faint or lightheaded; falls; breathing problems unusual bruising or bleeding Side effects that usually do not require medical attention (report to yourdoctor or health care professional if they continue or are bothersome): changes in taste diarrhea hair loss loss of appetite mouth sores This list may not describe all possible side effects. Call your doctor for medical advice about side effects. You may report side effects to FDA at1-800-FDA-1088. Where should I keep my medication? This drug is given in a hospital or clinic and will not be stored at home. NOTE: This sheet is a summary. It may not cover all possible information. If you have questions about this medicine, talk  to your doctor, pharmacist, orhealth care provider.  2022 Elsevier/Gold Standard (2018-04-26 16:57:15)  Vincristine injection What is this medication? VINCRISTINE (vin KRIS teen) is a chemotherapy drug. It slows the growth of cancer cells. This medicine is used to treat many types of cancer like Hodgkin's disease, leukemia, non-Hodgkin's lymphoma, neuroblastoma (braincancer), rhabdomyosarcoma, and Wilms' tumor. This medicine may be used for other purposes; ask your health care provider orpharmacist if you have questions. COMMON BRAND NAME(S): Oncovin, Vincasar PFS What should I tell my care team before I take this medication? They need to know if you have any of these conditions: blood disorders gout infection (especially chickenpox, cold sores, or herpes) kidney disease liver disease lung disease nervous system disease like Charcot-Marie-Tooth (CMT) recent or ongoing radiation therapy an unusual or allergic reaction to vincristine, other chemotherapy  agents, other medicines, foods, dyes, or preservatives pregnant or trying to get pregnant breast-feeding How should I use this medication? This drug is given as an infusion into a vein. It is administered in a hospital or clinic by a specially trained health care professional. If you have pain, swelling, burning, or any unusual feeling around the site of your injection,tell your health care professional right away. Talk to your pediatrician regarding the use of this medicine in children. Whilethis drug may be prescribed for selected conditions, precautions do apply. Overdosage: If you think you have taken too much of this medicine contact apoison control center or emergency room at once. NOTE: This medicine is only for you. Do not share this medicine with others. What if I miss a dose? It is important not to miss your dose. Call your doctor or health careprofessional if you are unable to keep an appointment. What may interact with this medication? certain medicines for fungal infections like itraconazole, ketoconazole, posaconazole, voriconazole certain medicines for seizures like phenytoin This list may not describe all possible interactions. Give your health care provider a list of all the medicines, herbs, non-prescription drugs, or dietary supplements you use. Also tell them if you smoke, drink alcohol, or use illegaldrugs. Some items may interact with your medicine. What should I watch for while using this medication? This drug may make you feel generally unwell. This is not uncommon, as chemotherapy can affect healthy cells as well as cancer cells. Report any side effects. Continue your course of treatment even though you feel ill unless yourdoctor tells you to stop. You may need blood work done while you are taking this medicine. This medicine will cause constipation. Try to have a bowel movement at least every 2 to 3 days. If you do not have a bowel movement for 3 days, call yourdoctor or  health care professional. In some cases, you may be given additional medicines to help with side effects.Follow all directions for their use. Do not become pregnant while taking this medicine. Women should inform their doctor if they wish to become pregnant or think they might be pregnant. There is a potential for serious side effects to an unborn child. Talk to your health care professional or pharmacist for more information. Do not breast-feed aninfant while taking this medicine. This medicine may make it more difficult to get pregnant or to father a child.Talk to your healthcare professional if you are concerned about your fertility. What side effects may I notice from receiving this medication? Side effects that you should report to your doctor or health care professionalas soon as possible: allergic reactions like skin rash, itching or hives, swelling of the face,  lips, or tongue breathing problems confusion or changes in emotions or moods constipation cough mouth sores muscle weakness nausea and vomiting pain, swelling, redness or irritation at the injection site pain, tingling, numbness in the hands or feet problems with balance, talking, walking seizures stomach pain trouble passing urine or change in the amount of urine Side effects that usually do not require medical attention (report to yourdoctor or health care professional if they continue or are bothersome): diarrhea hair loss jaw pain loss of appetite This list may not describe all possible side effects. Call your doctor for medical advice about side effects. You may report side effects to FDA at1-800-FDA-1088. Where should I keep my medication? This drug is given in a hospital or clinic and will not be stored at home. NOTE: This sheet is a summary. It may not cover all possible information. If you have questions about this medicine, talk to your doctor, pharmacist, orhealth care provider.  2022 Elsevier/Gold Standard  (2019-01-30 17:05:13)   Rituximab Injection What is this medication? RITUXIMAB (ri TUX i mab) is a monoclonal antibody. It is used to treat certain types of cancer like non-Hodgkin lymphoma and chronic lymphocytic leukemia. It is also used to treat rheumatoid arthritis, granulomatosis with polyangiitis,microscopic polyangiitis, and pemphigus vulgaris. This medicine may be used for other purposes; ask your health care provider orpharmacist if you have questions. COMMON BRAND NAME(S): RIABNI, Rituxan, RUXIENCE What should I tell my care team before I take this medication? They need to know if you have any of these conditions: chest pain heart disease infection especially a viral infection such as chickenpox, cold sores, hepatitis B, or herpes immune system problems irregular heartbeat or rhythm kidney disease low blood counts (white cells, platelets, or red cells) lung disease recent or upcoming vaccine an unusual or allergic reaction to rituximab, other medicines, foods, dyes, or preservatives pregnant or trying to get pregnant breast-feeding How should I use this medication? This medicine is injected into a vein. It is given by a health care provider ina hospital or clinic setting. A special MedGuide will be given to you before each treatment. Be sure to readthis information carefully each time. Talk to your health care provider about the use of this medicine in children. While this drug may be prescribed for children as young as 6 months forselected conditions, precautions do apply. Overdosage: If you think you have taken too much of this medicine contact apoison control center or emergency room at once. NOTE: This medicine is only for you. Do not share this medicine with others. What if I miss a dose? Keep appointments for follow-up doses. It is important not to miss your dose.Call your health care provider if you are unable to keep an appointment. What may interact with this  medication? Do not take this medicine with any of the following medicines: live vaccines This medicine may also interact with the following medicines: cisplatin This list may not describe all possible interactions. Give your health care provider a list of all the medicines, herbs, non-prescription drugs, or dietary supplements you use. Also tell them if you smoke, drink alcohol, or use illegaldrugs. Some items may interact with your medicine. What should I watch for while using this medication? Your condition will be monitored carefully while you are receiving thismedicine. You may need blood work done while you are taking this medicine. This medicine can cause serious infusion reactions. To reduce the risk your health care provider may give you other medicines to take before receiving  thisone. Be sure to follow the directions from your health care provider. This medicine may increase your risk of getting an infection. Call your health care provider for advice if you get a fever, chills, sore throat, or other symptoms of a cold or flu. Do not treat yourself. Try to avoid being aroundpeople who are sick. Call your health care provider if you are around anyone with measles,chickenpox, or if you develop sores or blisters that do not heal properly. Avoid taking medicines that contain aspirin, acetaminophen, ibuprofen, naproxen, or ketoprofen unless instructed by your health care provider. Thesemedicines may hide a fever. This medicine may cause serious skin reactions. They can happen weeks to months after starting the medicine. Contact your health care provider right away if you notice fevers or flu-like symptoms with a rash. The rash may be red or purple and then turn into blisters or peeling of the skin. Or, you might notice a red rash with swelling of the face, lips or lymph nodes in your neck or underyour arms. In some patients, this medicine may cause a serious brain infection that may cause death. If  you have any problems seeing, thinking, speaking, walking, or standing, tell your healthcare professional right away. If you cannot reachyour healthcare professional, urgently seek other source of medical care. Do not become pregnant while taking this medicine or for at least 12 months after stopping it. Women should inform their health care provider if they wish to become pregnant or think they might be pregnant. There is potential for serious harm to an unborn child. Talk to your health care provider for more information. Women should use a reliable form of birth control while taking this medicine and for 12 months after stopping it. Do not breast-feed whiletaking this medicine or for at least 6 months after stopping it. What side effects may I notice from receiving this medication? Side effects that you should report to your health care provider as soon aspossible: allergic reactions (skin rash, itching or hives; swelling of the face, lips, or tongue) diarrhea edema (sudden weight gain; swelling of the ankles, feet, hands or other unusual swelling; trouble breathing) fast, irregular heartbeat heart attack (trouble breathing; pain or tightness in the chest, neck, back or arms; unusually weak or tired) infection (fever, chills, cough, sore throat, pain or trouble passing urine) kidney injury (trouble passing urine or change in the amount of urine) liver injury (dark yellow or brown urine; general ill feeling or flu-like symptoms; loss of appetite, right upper belly pain; unusually weak or tired, yellowing of the eyes or skin) low blood pressure (dizziness; feeling faint or lightheaded, falls; unusually weak or tired) low red blood cell counts (trouble breathing; feeling faint; lightheaded, falls; unusually weak or tired) mouth sores redness, blistering, peeling, or loosening of the skin, including inside the mouth stomach pain unusual bruising or bleeding wheezing (trouble breathing with loud or  whistling sounds) vomiting Side effects that usually do not require medical attention (report to yourhealth care provider if they continue or are bothersome): headache joint pain muscle cramps, pain nausea This list may not describe all possible side effects. Call your doctor for medical advice about side effects. You may report side effects to FDA at1-800-FDA-1088. Where should I keep my medication? This medicine is given in a hospital or clinic. It will not be stored at home. NOTE: This sheet is a summary. It may not cover all possible information. If you have questions about this medicine, talk to your doctor, pharmacist,  orhealth care provider.  2022 Elsevier/Gold Standard (2020-02-21 15:47:26)

## 2020-10-06 NOTE — Progress Notes (Unsigned)
Per Dr Janese Banks. Ok to proceed with treatment today with ANC 1.4

## 2020-10-07 ENCOUNTER — Inpatient Hospital Stay: Payer: Medicaid Other

## 2020-10-07 VITALS — BP 131/72 | HR 84 | Temp 96.0°F | Resp 16

## 2020-10-07 DIAGNOSIS — C8510 Unspecified B-cell lymphoma, unspecified site: Secondary | ICD-10-CM

## 2020-10-07 MED ORDER — SODIUM CHLORIDE 0.9 % IV SOLN
Freq: Once | INTRAVENOUS | Status: AC
  Administered 2020-10-07: 8 mg via INTRAVENOUS
  Filled 2020-10-07: qty 4

## 2020-10-07 MED ORDER — VINCRISTINE SULFATE CHEMO INJECTION 1 MG/ML
Freq: Once | INTRAVENOUS | Status: AC
  Filled 2020-10-07: qty 10

## 2020-10-07 MED ORDER — SODIUM CHLORIDE 0.9 % IV SOLN
Freq: Once | INTRAVENOUS | Status: AC
  Filled 2020-10-07: qty 250

## 2020-10-07 NOTE — Patient Instructions (Signed)
[facility]COL[facility]  Discharge Instructions: Thank you for choosing [facility] to provide your oncology and hematology care.  If you have a lab appointment with the [facility], please go directly to the [facility] and check in at the registration area.  Wear comfortable clothing and clothing appropriate for easy access to any Portacath or PICC line.   We strive to give you quality time with your provider. You may need to reschedule your appointment if you arrive late (15 or more minutes).  Arriving late affects you and other patients whose appointments are after yours.  Also, if you miss three or more appointments without notifying the office, you may be dismissed from the clinic at the provider's discretion.      For prescription refill requests, have your pharmacy contact our office and allow 72 hours for refills to be completed.    Today you received the following chemotherapy and/or immunotherapy agents R-EPOCH      To help prevent nausea and vomiting after your treatment, we encourage you to take your nausea medication as directed.  BELOW ARE SYMPTOMS THAT SHOULD BE REPORTED IMMEDIATELY: *FEVER GREATER THAN 100.4 F (38 C) OR HIGHER *CHILLS OR SWEATING *NAUSEA AND VOMITING THAT IS NOT CONTROLLED WITH YOUR NAUSEA MEDICATION *UNUSUAL SHORTNESS OF BREATH *UNUSUAL BRUISING OR BLEEDING *URINARY PROBLEMS (pain or burning when urinating, or frequent urination) *BOWEL PROBLEMS (unusual diarrhea, constipation, pain near the anus) TENDERNESS IN MOUTH AND THROAT WITH OR WITHOUT PRESENCE OF ULCERS (sore throat, sores in mouth, or a toothache) UNUSUAL RASH, SWELLING OR PAIN  UNUSUAL VAGINAL DISCHARGE OR ITCHING   Items with * indicate a potential emergency and should be followed up as soon as possible or go to the Emergency Department if any problems should occur.  Please show the CHEMOTHERAPY ALERT CARD or IMMUNOTHERAPY ALERT CARD at check-in to  the Emergency Department and triage nurse.  Should you have questions after your visit or need to cancel or reschedule your appointment, please contact [facility]  [phone] and follow the prompts.  Office hours are 8:00 a.m. to 4:30 p.m. Monday - Friday. Please note that voicemails left after 4:00 p.m. may not be returned until the following business day.  We are closed weekends and major holidays. You have access to a nurse at all times for urgent questions. Please call the main number to the clinic [phone] and follow the prompts.  For any non-urgent questions, you may also contact your provider using MyChart. We now offer e-Visits for anyone 106 and older to request care online for non-urgent symptoms. For details visit [email or web address].   Also download the MyChart app! Go to the app store, search "MyChart", open the app, select [facility], and log in with your MyChart username and password.  Due to Covid, a mask is required upon entering the hospital/clinic. If you do not have a mask, one will be given to you upon arrival. For doctor visits, patients may have 1 support person aged 76 or older with them. For treatment visits, patients cannot have anyone with them due to current Covid guidelines and our immunocompromised population.

## 2020-10-07 NOTE — Progress Notes (Signed)
Patient on schedule for LP Methotrexate injection, called and spoke with patient, aware to be here @ 0830, driver post procedure/recovery/discharge.stated understanding.

## 2020-10-08 ENCOUNTER — Inpatient Hospital Stay: Payer: Medicaid Other

## 2020-10-08 ENCOUNTER — Other Ambulatory Visit: Payer: Self-pay

## 2020-10-08 VITALS — BP 128/70 | HR 86 | Temp 97.0°F | Resp 17

## 2020-10-08 DIAGNOSIS — C8510 Unspecified B-cell lymphoma, unspecified site: Secondary | ICD-10-CM

## 2020-10-08 MED ORDER — SODIUM CHLORIDE 0.9 % IV SOLN
Freq: Once | INTRAVENOUS | Status: AC
  Administered 2020-10-08: 8 mg via INTRAVENOUS
  Filled 2020-10-08: qty 4

## 2020-10-08 MED ORDER — SODIUM CHLORIDE 0.9 % IV SOLN
Freq: Once | INTRAVENOUS | Status: AC
Start: 2020-10-08 — End: 2020-10-08
  Filled 2020-10-08: qty 250

## 2020-10-08 MED ORDER — VINCRISTINE SULFATE CHEMO INJECTION 1 MG/ML
Freq: Once | INTRAVENOUS | Status: AC
  Filled 2020-10-08: qty 10

## 2020-10-08 NOTE — Patient Instructions (Signed)
[facility]COLOG[facility]  Discharge Instructions: Thank you for choosing [facility] to provide your oncology and hematology care.  If you have a lab appointment with the [facility], please go directly to the [facility] and check in at the registration area.  Wear comfortable clothing and clothing appropriate for easy access to any Portacath or PICC line.   We strive to give you quality time with your provider. You may need to reschedule your appointment if you arrive late (15 or more minutes).  Arriving late affects you and other patients whose appointments are after yours.  Also, if you miss three or more appointments without notifying the office, you may be dismissed from the clinic at the provider's discretion.      For prescription refill requests, have your pharmacy contact our office and allow 72 hours for refills to be completed.    Today you received the following chemotherapy and/or immunotherapy agents Adriamycin, Vincristine and Etoposide      To help prevent nausea and vomiting after your treatment, we encourage you to take your nausea medication as directed.  BELOW ARE SYMPTOMS THAT SHOULD BE REPORTED IMMEDIATELY: *FEVER GREATER THAN 100.4 F (38 C) OR HIGHER *CHILLS OR SWEATING *NAUSEA AND VOMITING THAT IS NOT CONTROLLED WITH YOUR NAUSEA MEDICATION *UNUSUAL SHORTNESS OF BREATH *UNUSUAL BRUISING OR BLEEDING *URINARY PROBLEMS (pain or burning when urinating, or frequent urination) *BOWEL PROBLEMS (unusual diarrhea, constipation, pain near the anus) TENDERNESS IN MOUTH AND THROAT WITH OR WITHOUT PRESENCE OF ULCERS (sore throat, sores in mouth, or a toothache) UNUSUAL RASH, SWELLING OR PAIN  UNUSUAL VAGINAL DISCHARGE OR ITCHING   Items with * indicate a potential emergency and should be followed up as soon as possible or go to the Emergency Department if any problems should occur.  Please show the CHEMOTHERAPY ALERT CARD or  IMMUNOTHERAPY ALERT CARD at check-in to the Emergency Department and triage nurse.  Should you have questions after your visit or need to cancel or reschedule your appointment, please contact [facility]  [phone] and follow the prompts.  Office hours are 8:00 a.m. to 4:30 p.m. Monday - Friday. Please note that voicemails left after 4:00 p.m. may not be returned until the following business day.  We are closed weekends and major holidays. You have access to a nurse at all times for urgent questions. Please call the main number to the clinic [phone] and follow the prompts.  For any non-urgent questions, you may also contact your provider using MyChart. We now offer e-Visits for anyone 26 and older to request care online for non-urgent symptoms. For details visit [email or web address].   Also download the MyChart app! Go to the app store, search "MyChart", open the app, select [facility], and log in with your MyChart username and password.  Due to Covid, a mask is required upon entering the hospital/clinic. If you do not have a mask, one will be given to you upon arrival. For doctor visits, patients may have 1 support person aged 35 or older with them. For treatment visits, patients cannot have anyone with them due to current Covid guidelines and our immunocompromised population.

## 2020-10-09 ENCOUNTER — Inpatient Hospital Stay: Payer: Medicaid Other

## 2020-10-09 VITALS — BP 138/71 | HR 68 | Temp 98.0°F | Resp 16

## 2020-10-09 DIAGNOSIS — C8510 Unspecified B-cell lymphoma, unspecified site: Secondary | ICD-10-CM

## 2020-10-09 MED ORDER — SODIUM CHLORIDE 0.9 % IV SOLN
Freq: Once | INTRAVENOUS | Status: AC
  Administered 2020-10-09: 8 mg via INTRAVENOUS
  Filled 2020-10-09: qty 4

## 2020-10-09 MED ORDER — VINCRISTINE SULFATE CHEMO INJECTION 1 MG/ML
Freq: Once | INTRAVENOUS | Status: AC
  Filled 2020-10-09: qty 10

## 2020-10-09 MED ORDER — SODIUM CHLORIDE 0.9 % IV SOLN
Freq: Once | INTRAVENOUS | Status: AC
  Filled 2020-10-09: qty 250

## 2020-10-09 NOTE — Patient Instructions (Signed)
[facility] ONCOLOG[facility]  Discharge Instructions: Thank you for choosing [facility] to provide your oncology and hematology care.  If you have a lab appointment with the [facility], please go directly to the [facility] and check in at the registration area.  Wear comfortable clothing and clothing appropriate for easy access to any Portacath or PICC line.   We strive to give you quality time with your provider. You may need to reschedule your appointment if you arrive late (15 or more minutes).  Arriving late affects you and other patients whose appointments are after yours.  Also, if you miss three or more appointments without notifying the office, you may be dismissed from the clinic at the provider's discretion.      For prescription refill requests, have your pharmacy contact our office and allow 72 hours for refills to be completed.    Today you received the following chemotherapy and/or immunotherapy agents    Adriamycin, Vincristine and Etoposide        To help prevent nausea and vomiting after your treatment, we encourage you to take your nausea medication as directed.  BELOW ARE SYMPTOMS THAT SHOULD BE REPORTED IMMEDIATELY: *FEVER GREATER THAN 100.4 F (38 C) OR HIGHER *CHILLS OR SWEATING *NAUSEA AND VOMITING THAT IS NOT CONTROLLED WITH YOUR NAUSEA MEDICATION *UNUSUAL SHORTNESS OF BREATH *UNUSUAL BRUISING OR BLEEDING *URINARY PROBLEMS (pain or burning when urinating, or frequent urination) *BOWEL PROBLEMS (unusual diarrhea, constipation, pain near the anus) TENDERNESS IN MOUTH AND THROAT WITH OR WITHOUT PRESENCE OF ULCERS (sore throat, sores in mouth, or a toothache) UNUSUAL RASH, SWELLING OR PAIN  UNUSUAL VAGINAL DISCHARGE OR ITCHING   Items with * indicate a potential emergency and should be followed up as soon as possible or go to the Emergency Department if any problems should occur.  Please show the CHEMOTHERAPY ALERT CARD or  IMMUNOTHERAPY ALERT CARD at check-in to the Emergency Department and triage nurse.  Should you have questions after your visit or need to cancel or reschedule your appointment, please contact [facility]  [phone] and follow the prompts.  Office hours are 8:00 a.m. to 4:30 p.m. Monday - Friday. Please note that voicemails left after 4:00 p.m. may not be returned until the following business day.  We are closed weekends and major holidays. You have access to a nurse at all times for urgent questions. Please call the main number to the clinic [phone] and follow the prompts.  For any non-urgent questions, you may also contact your provider using MyChart. We now offer e-Visits for anyone 61 and older to request care online for non-urgent symptoms. For details visit [email or web address].   Also download the MyChart app! Go to the app store, search "MyChart", open the app, select [facility], and log in with your MyChart username and password.  Due to Covid, a mask is required upon entering the hospital/clinic. If you do not have a mask, one will be given to you upon arrival. For doctor visits, patients may have 1 support person aged 56 or older with them. For treatment visits, patients cannot have anyone with them due to current Covid guidelines and our immunocompromised population.

## 2020-10-10 ENCOUNTER — Other Ambulatory Visit: Payer: Self-pay

## 2020-10-10 ENCOUNTER — Inpatient Hospital Stay: Payer: Medicaid Other

## 2020-10-10 VITALS — BP 129/79 | HR 66 | Temp 97.0°F

## 2020-10-10 DIAGNOSIS — C8510 Unspecified B-cell lymphoma, unspecified site: Secondary | ICD-10-CM

## 2020-10-10 MED ORDER — HEPARIN SOD (PORK) LOCK FLUSH 100 UNIT/ML IV SOLN
500.0000 [IU] | Freq: Once | INTRAVENOUS | Status: AC
  Administered 2020-10-10: 500 [IU]
  Filled 2020-10-10: qty 5

## 2020-10-10 MED ORDER — PEGFILGRASTIM 6 MG/0.6ML ~~LOC~~ PSKT
6.0000 mg | PREFILLED_SYRINGE | Freq: Once | SUBCUTANEOUS | Status: AC
  Administered 2020-10-10: 6 mg via SUBCUTANEOUS
  Filled 2020-10-10: qty 0.6

## 2020-10-10 MED ORDER — HEPARIN SOD (PORK) LOCK FLUSH 100 UNIT/ML IV SOLN
INTRAVENOUS | Status: AC
  Filled 2020-10-10: qty 5

## 2020-10-10 MED ORDER — ONDANSETRON HCL 40 MG/20ML IJ SOLN
Freq: Once | INTRAMUSCULAR | Status: AC
  Administered 2020-10-10: 16 mg via INTRAVENOUS
  Filled 2020-10-10: qty 8

## 2020-10-10 MED ORDER — SODIUM CHLORIDE 0.9 % IV SOLN
INTRAVENOUS | Status: DC
  Filled 2020-10-10: qty 250

## 2020-10-10 MED ORDER — SODIUM CHLORIDE 0.9 % IV SOLN
1500.0000 mg | Freq: Once | INTRAVENOUS | Status: AC
  Administered 2020-10-10: 1500 mg via INTRAVENOUS
  Filled 2020-10-10: qty 50

## 2020-10-10 NOTE — Patient Instructions (Signed)
[facility] ONCOLOGY  Discharge Instructions: Thank you for choosing [facility] to provide your oncology and hematology care.  If you have a lab appointment with the [facility], please go directly to the [facility] and check in at the registration area.  Wear comfortable clothing and clothing appropriate for easy access to any Portacath or PICC line.   We strive to give you quality time with your provider. You may need to reschedule your appointment if you arrive late (15 or more minutes).  Arriving late affects you and other patients whose appointments are after yours.  Also, if you miss three or more appointments without notifying the office, you may be dismissed from the clinic at the provider's discretion.      For prescription refill requests, have your pharmacy contact our office and allow 72 hours for refills to be completed.    Today you received the following chemotherapy and/or immunotherapy agents : Cytoxan   To help prevent nausea and vomiting after your treatment, we encourage you to take your nausea medication as directed.  BELOW ARE SYMPTOMS THAT SHOULD BE REPORTED IMMEDIATELY: *FEVER GREATER THAN 100.4 F (38 C) OR HIGHER *CHILLS OR SWEATING *NAUSEA AND VOMITING THAT IS NOT CONTROLLED WITH YOUR NAUSEA MEDICATION *UNUSUAL SHORTNESS OF BREATH *UNUSUAL BRUISING OR BLEEDING *URINARY PROBLEMS (pain or burning when urinating, or frequent urination) *BOWEL PROBLEMS (unusual diarrhea, constipation, pain near the anus) TENDERNESS IN MOUTH AND THROAT WITH OR WITHOUT PRESENCE OF ULCERS (sore throat, sores in mouth, or a toothache) UNUSUAL RASH, SWELLING OR PAIN  UNUSUAL VAGINAL DISCHARGE OR ITCHING   Items with * indicate a potential emergency and should be followed up as soon as possible or go to the Emergency Department if any problems should occur.  Please show the CHEMOTHERAPY ALERT CARD or IMMUNOTHERAPY ALERT CARD at check-in to  the Emergency Department and triage nurse.  Should you have questions after your visit or need to cancel or reschedule your appointment, please contact [facility]  [phone] and follow the prompts.  Office hours are 8:00 a.m. to 4:30 p.m. Monday - Friday. Please note that voicemails left after 4:00 p.m. may not be returned until the following business day.  We are closed weekends and major holidays. You have access to a nurse at all times for urgent questions. Please call the main number to the clinic [phone] and follow the prompts.  For any non-urgent questions, you may also contact your provider using MyChart. We now offer e-Visits for anyone 60 and older to request care online for non-urgent symptoms. For details visit [email or web address].   Also download the MyChart app! Go to the app store, search "MyChart", open the app, select [facility], and log in with your MyChart username and password.  Due to Covid, a mask is required upon entering the hospital/clinic. If you do not have a mask, one will be given to you upon arrival. For doctor visits, patients may have 1 support person aged 51 or older with them. For treatment visits, patients cannot have anyone with them due to current Covid guidelines and our immunocompromised population.

## 2020-10-11 ENCOUNTER — Other Ambulatory Visit: Payer: Self-pay | Admitting: *Deleted

## 2020-10-11 DIAGNOSIS — C8510 Unspecified B-cell lymphoma, unspecified site: Secondary | ICD-10-CM

## 2020-10-13 ENCOUNTER — Other Ambulatory Visit: Payer: Self-pay

## 2020-10-13 ENCOUNTER — Inpatient Hospital Stay: Payer: Medicaid Other | Attending: Oncology

## 2020-10-13 DIAGNOSIS — Z809 Family history of malignant neoplasm, unspecified: Secondary | ICD-10-CM | POA: Insufficient documentation

## 2020-10-13 DIAGNOSIS — R7402 Elevation of levels of lactic acid dehydrogenase (LDH): Secondary | ICD-10-CM | POA: Insufficient documentation

## 2020-10-13 DIAGNOSIS — C8510 Unspecified B-cell lymphoma, unspecified site: Secondary | ICD-10-CM

## 2020-10-13 DIAGNOSIS — C8511 Unspecified B-cell lymphoma, lymph nodes of head, face, and neck: Secondary | ICD-10-CM | POA: Insufficient documentation

## 2020-10-13 DIAGNOSIS — Z8249 Family history of ischemic heart disease and other diseases of the circulatory system: Secondary | ICD-10-CM | POA: Insufficient documentation

## 2020-10-13 DIAGNOSIS — Z5111 Encounter for antineoplastic chemotherapy: Secondary | ICD-10-CM | POA: Insufficient documentation

## 2020-10-13 DIAGNOSIS — Z832 Family history of diseases of the blood and blood-forming organs and certain disorders involving the immune mechanism: Secondary | ICD-10-CM | POA: Insufficient documentation

## 2020-10-13 DIAGNOSIS — Z79899 Other long term (current) drug therapy: Secondary | ICD-10-CM | POA: Insufficient documentation

## 2020-10-13 DIAGNOSIS — T451X5A Adverse effect of antineoplastic and immunosuppressive drugs, initial encounter: Secondary | ICD-10-CM | POA: Insufficient documentation

## 2020-10-13 DIAGNOSIS — Z8269 Family history of other diseases of the musculoskeletal system and connective tissue: Secondary | ICD-10-CM | POA: Insufficient documentation

## 2020-10-13 DIAGNOSIS — Z8349 Family history of other endocrine, nutritional and metabolic diseases: Secondary | ICD-10-CM | POA: Insufficient documentation

## 2020-10-13 DIAGNOSIS — Z5189 Encounter for other specified aftercare: Secondary | ICD-10-CM | POA: Insufficient documentation

## 2020-10-13 DIAGNOSIS — Z87891 Personal history of nicotine dependence: Secondary | ICD-10-CM | POA: Insufficient documentation

## 2020-10-13 DIAGNOSIS — D701 Agranulocytosis secondary to cancer chemotherapy: Secondary | ICD-10-CM | POA: Insufficient documentation

## 2020-10-13 LAB — CBC WITH DIFFERENTIAL/PLATELET
Abs Immature Granulocytes: 0.1 10*3/uL — ABNORMAL HIGH (ref 0.00–0.07)
Basophils Absolute: 0 10*3/uL (ref 0.0–0.1)
Basophils Relative: 0 %
Eosinophils Absolute: 0.1 10*3/uL (ref 0.0–0.5)
Eosinophils Relative: 1 %
HCT: 32.9 % — ABNORMAL LOW (ref 39.0–52.0)
Hemoglobin: 11.5 g/dL — ABNORMAL LOW (ref 13.0–17.0)
Immature Granulocytes: 1 %
Lymphocytes Relative: 10 %
Lymphs Abs: 0.8 10*3/uL (ref 0.7–4.0)
MCH: 31.5 pg (ref 26.0–34.0)
MCHC: 35 g/dL (ref 30.0–36.0)
MCV: 90.1 fL (ref 80.0–100.0)
Monocytes Absolute: 0 10*3/uL — ABNORMAL LOW (ref 0.1–1.0)
Monocytes Relative: 1 %
Neutro Abs: 6.5 10*3/uL (ref 1.7–7.7)
Neutrophils Relative %: 87 %
Platelets: 282 10*3/uL (ref 150–400)
RBC: 3.65 MIL/uL — ABNORMAL LOW (ref 4.22–5.81)
RDW: 14.8 % (ref 11.5–15.5)
Smear Review: NORMAL
WBC: 7.5 10*3/uL (ref 4.0–10.5)
nRBC: 0 % (ref 0.0–0.2)

## 2020-10-14 ENCOUNTER — Inpatient Hospital Stay: Payer: Medicaid Other

## 2020-10-14 ENCOUNTER — Ambulatory Visit
Admission: RE | Admit: 2020-10-14 | Discharge: 2020-10-14 | Disposition: A | Discharge status: Home / Self Care | Payer: Medicaid Other | Source: Ambulatory Visit | Attending: Oncology | Admitting: Oncology

## 2020-10-14 VITALS — BP 153/97 | HR 68 | Temp 98.4°F | Resp 15

## 2020-10-14 DIAGNOSIS — C8510 Unspecified B-cell lymphoma, unspecified site: Secondary | ICD-10-CM | POA: Insufficient documentation

## 2020-10-14 IMAGING — RF DG FLUORO GUIDE SPINAL/SI JT INJ*L*
7 series · 7 of 7 positions shown · non-contrast
Comparison: [DATE]

CLINICAL DATA: Lymphoma.  Requirement for intrathecal chemotherapy.

EXAM:
DIAGNOSTIC LUMBAR PUNCTURE UNDER FLUOROSCOPIC GUIDANCE
INDICATION: Lymphoma.  Intrathecal chemotherapy.

[Series 1: fluoro_iodine 2fps_bw · 0.20mm/px · 1 of 1 slices shown (1 of 7)]
[im 1/1]
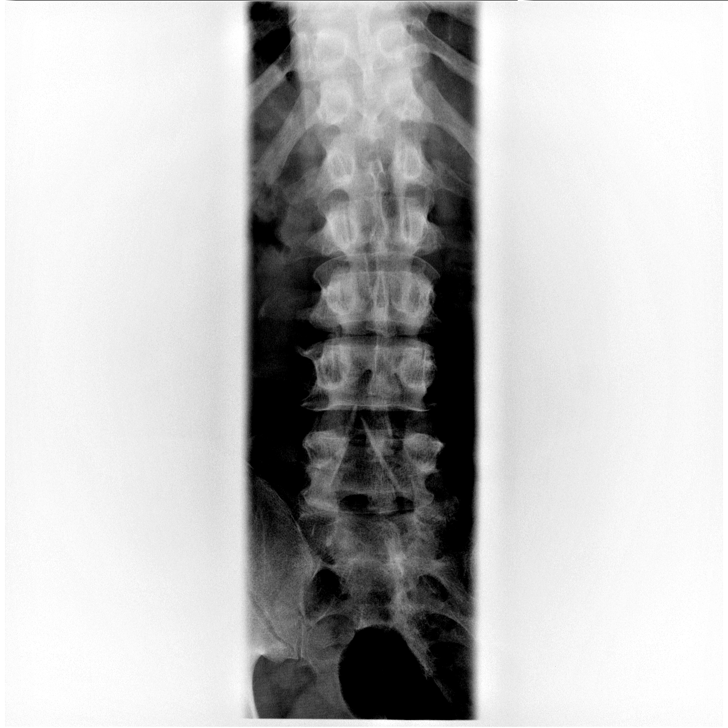

[Series 2: fluoro_iodine 2fps_bw · 0.18mm/px · 1 of 1 slices shown (2 of 7)]
[im 1/1]
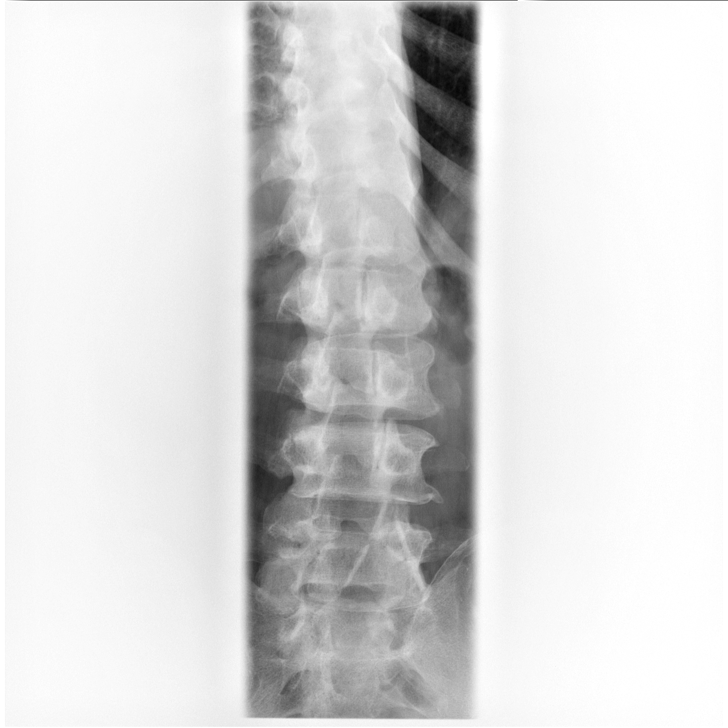

[Series 3: fluoro_iodine 2fps_bw · 0.18mm/px · 1 of 1 slices shown (3 of 7)]
[im 1/1]
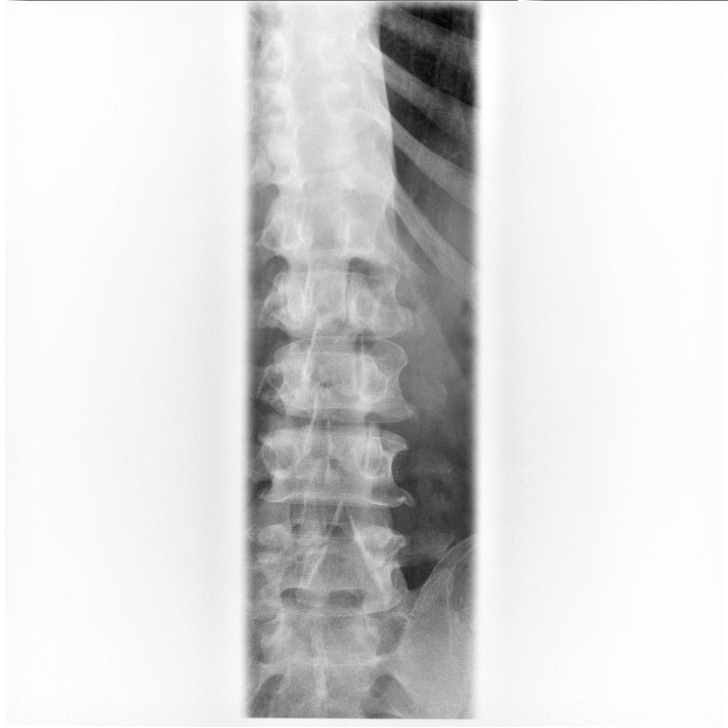

[Series 4: fluoro_iodine 2fps_bw · 0.19mm/px · 1 of 1 slices shown (4 of 7)]
[im 1/1]
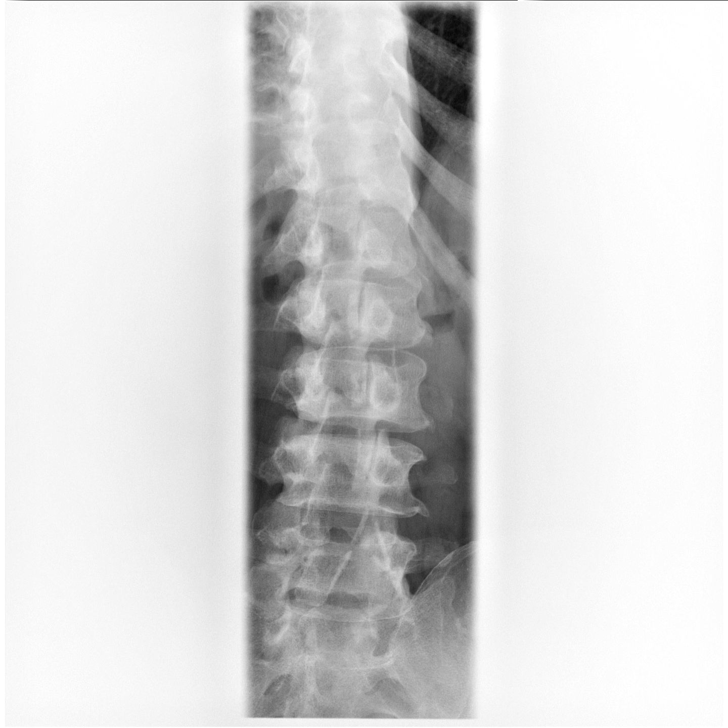

[Series 5: fluoro_iodine 2fps_bw · 0.18mm/px · 1 of 1 slices shown (5 of 7)]
[im 1/1]
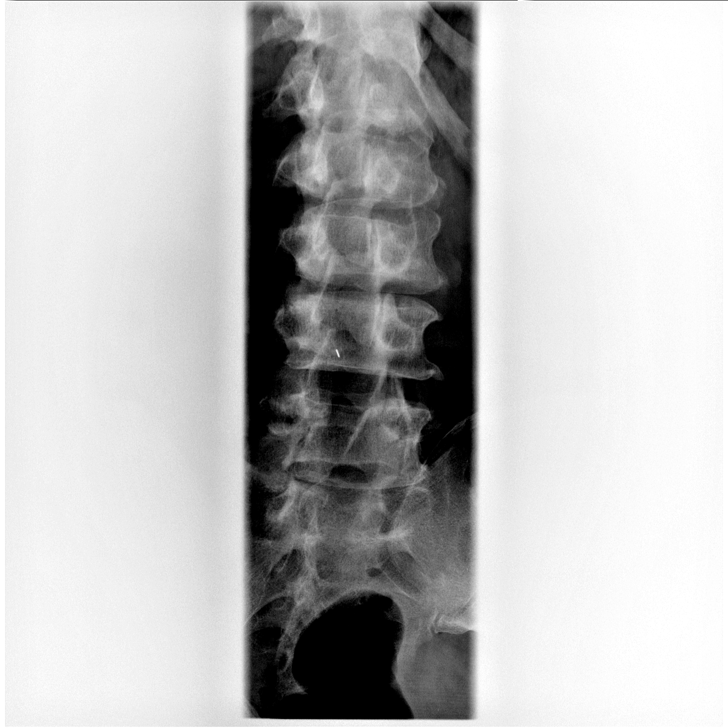

[Series 6: fluoro_iodine 2fps_bw · 0.18mm/px · 1 of 1 slices shown (6 of 7)]
[im 1/1]
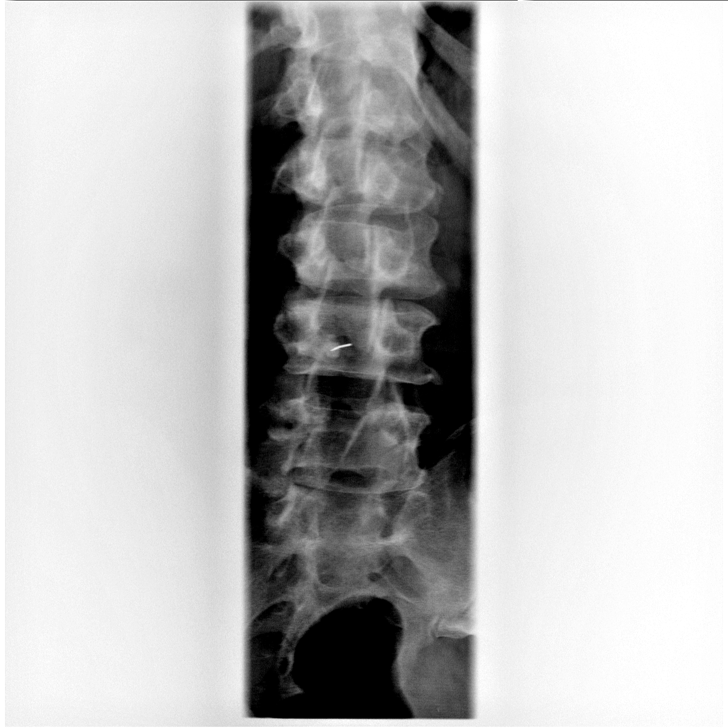

[Series 7: fluoro_iodine 2fps_bw · 0.18mm/px · 1 of 1 slices shown (7 of 7)]
[im 1/1]
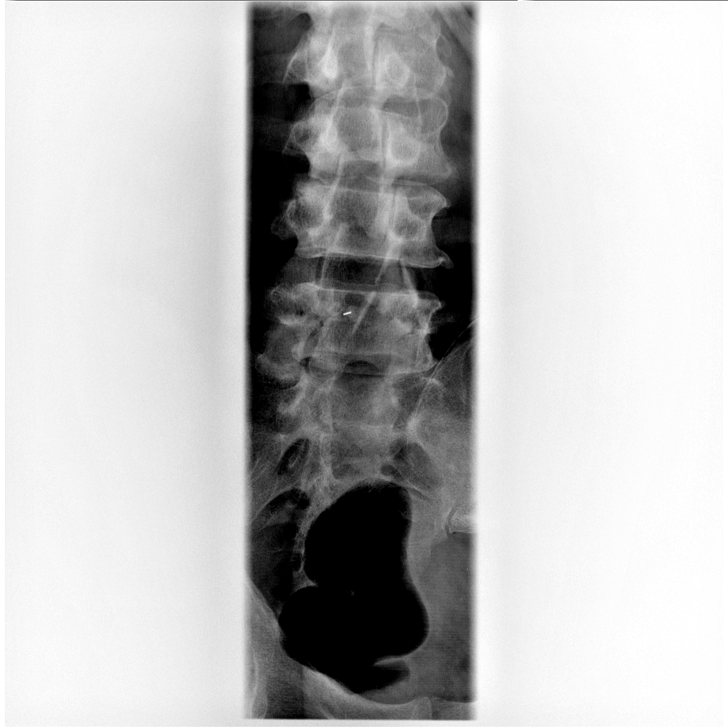

[7 of 7 positions shown; findings below may reference images not displayed]

FLUOROSCOPY TIME:  Fluoroscopy Time:  0 minutes and 54 seconds.

Radiation Exposure Index (if provided by the fluoroscopic device):
28.2 mGy

Number of Acquired Spot Images:

PROCEDURE:
Patient was alert and oriented x3. Prior to beginning the procedure,
the risks, benefits, and alternatives to lumbar puncture were
discussed with the patient. Emphasized risks included bleeding,
infection, headache, allergy, and inability to obtain CSF. The
patient voiced understanding and was given an opportunity to have
all questions answered. Written informed consent was obtained from
the patient prior to the procedure

A patient time-out was performed prior to initiating the procedure.

With the patient prone, appropriate skin sites over the lower back
were identified using fluoroscopic guidance. The lower back was then
prepped and draped in the usual sterile fashion. 1% Lidocaine was
used for local anesthesia. Lumbar puncture was attempted at the L3-4
level without success. Subsequently, lumbar puncture was performed
at the L4-5 level using a 22 gauge needle with return of clear CSF.
Approximately 5 cc of CSF was removed. Subsequently, the
methotrexate solution, as provided by the pharmacy, was slowly
injected into the thecal sac without difficulty. The patient
tolerated the procedure well and there were no apparent
complications.
IMPRESSION: Successful fluoro guided lumbar puncture for injection of
intrathecal methotrexate. No evidence for immediate complications.

## 2020-10-14 MED ORDER — SODIUM CHLORIDE (PF) 0.9 % IJ SOLN
Freq: Once | INTRAMUSCULAR | Status: AC
  Filled 2020-10-14: qty 0.48

## 2020-10-14 MED ORDER — LIDOCAINE HCL (PF) 1 % IJ SOLN
5.0000 mL | Freq: Once | INTRAMUSCULAR | Status: AC
  Administered 2020-10-14: 5 mL
  Filled 2020-10-14: qty 5

## 2020-10-14 NOTE — Progress Notes (Signed)
Spoke with providing radiologist. He okayed patient to leave at 3 hour mark if he was not having any adverse reactions or pain.

## 2020-10-15 ENCOUNTER — Inpatient Hospital Stay: Payer: Medicaid Other

## 2020-10-15 ENCOUNTER — Other Ambulatory Visit: Payer: Self-pay

## 2020-10-15 DIAGNOSIS — C8510 Unspecified B-cell lymphoma, unspecified site: Secondary | ICD-10-CM

## 2020-10-15 LAB — CBC WITH DIFFERENTIAL/PLATELET
Abs Immature Granulocytes: 0.11 10*3/uL — ABNORMAL HIGH (ref 0.00–0.07)
Basophils Absolute: 0 10*3/uL (ref 0.0–0.1)
Basophils Relative: 0 %
Eosinophils Absolute: 0 10*3/uL (ref 0.0–0.5)
Eosinophils Relative: 0 %
HCT: 33.8 % — ABNORMAL LOW (ref 39.0–52.0)
Hemoglobin: 11.6 g/dL — ABNORMAL LOW (ref 13.0–17.0)
Immature Granulocytes: 2 %
Lymphocytes Relative: 7 %
Lymphs Abs: 0.5 10*3/uL — ABNORMAL LOW (ref 0.7–4.0)
MCH: 31.5 pg (ref 26.0–34.0)
MCHC: 34.3 g/dL (ref 30.0–36.0)
MCV: 91.8 fL (ref 80.0–100.0)
Monocytes Absolute: 0 10*3/uL — ABNORMAL LOW (ref 0.1–1.0)
Monocytes Relative: 1 %
Neutro Abs: 5.8 10*3/uL (ref 1.7–7.7)
Neutrophils Relative %: 90 %
Platelets: 225 10*3/uL (ref 150–400)
RBC: 3.68 MIL/uL — ABNORMAL LOW (ref 4.22–5.81)
RDW: 14.6 % (ref 11.5–15.5)
Smear Review: NORMAL
WBC: 6.5 10*3/uL (ref 4.0–10.5)
nRBC: 0 % (ref 0.0–0.2)

## 2020-10-17 ENCOUNTER — Inpatient Hospital Stay: Payer: Medicaid Other

## 2020-10-20 ENCOUNTER — Other Ambulatory Visit: Payer: Self-pay

## 2020-10-20 ENCOUNTER — Inpatient Hospital Stay: Payer: Medicaid Other

## 2020-10-20 DIAGNOSIS — C8510 Unspecified B-cell lymphoma, unspecified site: Secondary | ICD-10-CM

## 2020-10-20 LAB — CBC WITH DIFFERENTIAL/PLATELET
Abs Immature Granulocytes: 0 10*3/uL (ref 0.00–0.07)
Basophils Absolute: 0 10*3/uL (ref 0.0–0.1)
Basophils Relative: 2 %
Eosinophils Absolute: 0.1 10*3/uL (ref 0.0–0.5)
Eosinophils Relative: 4 %
HCT: 32.4 % — ABNORMAL LOW (ref 39.0–52.0)
Hemoglobin: 11.1 g/dL — ABNORMAL LOW (ref 13.0–17.0)
Immature Granulocytes: 0 %
Lymphocytes Relative: 47 %
Lymphs Abs: 0.9 10*3/uL (ref 0.7–4.0)
MCH: 31.2 pg (ref 26.0–34.0)
MCHC: 34.3 g/dL (ref 30.0–36.0)
MCV: 91 fL (ref 80.0–100.0)
Monocytes Absolute: 0.3 10*3/uL (ref 0.1–1.0)
Monocytes Relative: 15 %
Neutro Abs: 0.6 10*3/uL — ABNORMAL LOW (ref 1.7–7.7)
Neutrophils Relative %: 32 %
Platelets: 114 10*3/uL — ABNORMAL LOW (ref 150–400)
RBC: 3.56 MIL/uL — ABNORMAL LOW (ref 4.22–5.81)
RDW: 14.5 % (ref 11.5–15.5)
WBC: 1.9 10*3/uL — ABNORMAL LOW (ref 4.0–10.5)
nRBC: 0 % (ref 0.0–0.2)

## 2020-10-22 ENCOUNTER — Inpatient Hospital Stay: Payer: Medicaid Other

## 2020-10-22 DIAGNOSIS — C8510 Unspecified B-cell lymphoma, unspecified site: Secondary | ICD-10-CM

## 2020-10-22 LAB — CBC WITH DIFFERENTIAL/PLATELET
Abs Immature Granulocytes: 0.01 10*3/uL (ref 0.00–0.07)
Basophils Absolute: 0 10*3/uL (ref 0.0–0.1)
Basophils Relative: 2 %
Eosinophils Absolute: 0.1 10*3/uL (ref 0.0–0.5)
Eosinophils Relative: 4 %
HCT: 32.3 % — ABNORMAL LOW (ref 39.0–52.0)
Hemoglobin: 11.3 g/dL — ABNORMAL LOW (ref 13.0–17.0)
Immature Granulocytes: 0 %
Lymphocytes Relative: 51 %
Lymphs Abs: 1.2 10*3/uL (ref 0.7–4.0)
MCH: 31.6 pg (ref 26.0–34.0)
MCHC: 35 g/dL (ref 30.0–36.0)
MCV: 90.2 fL (ref 80.0–100.0)
Monocytes Absolute: 0.4 10*3/uL (ref 0.1–1.0)
Monocytes Relative: 18 %
Neutro Abs: 0.6 10*3/uL — ABNORMAL LOW (ref 1.7–7.7)
Neutrophils Relative %: 25 %
Platelets: 127 10*3/uL — ABNORMAL LOW (ref 150–400)
RBC: 3.58 MIL/uL — ABNORMAL LOW (ref 4.22–5.81)
RDW: 14.3 % (ref 11.5–15.5)
WBC: 2.3 10*3/uL — ABNORMAL LOW (ref 4.0–10.5)
nRBC: 0 % (ref 0.0–0.2)

## 2020-10-23 ENCOUNTER — Telehealth: Payer: Self-pay | Admitting: *Deleted

## 2020-10-23 NOTE — Telephone Encounter (Signed)
Called pt earlier and left message. Pt had told me that with the intracthecal methotrexate.he feels that he does not have much energy, he gets hoarse and numbness to feet and legs is getting worse with every tx of methotrexate. I spoke to rao and she said that she will not make him do the intrathecal on next treatment. She still will need him to do the chemo and he said her will . He had called back later in the day and told me ok.

## 2020-10-25 ENCOUNTER — Other Ambulatory Visit: Payer: Self-pay | Admitting: *Deleted

## 2020-10-25 DIAGNOSIS — C8510 Unspecified B-cell lymphoma, unspecified site: Secondary | ICD-10-CM

## 2020-10-27 ENCOUNTER — Inpatient Hospital Stay: Payer: Medicaid Other

## 2020-10-27 ENCOUNTER — Other Ambulatory Visit: Payer: Self-pay | Admitting: *Deleted

## 2020-10-27 ENCOUNTER — Inpatient Hospital Stay (HOSPITAL_BASED_OUTPATIENT_CLINIC_OR_DEPARTMENT_OTHER): Payer: Self-pay | Admitting: Oncology

## 2020-10-27 ENCOUNTER — Encounter: Payer: Self-pay | Admitting: Oncology

## 2020-10-27 ENCOUNTER — Other Ambulatory Visit: Payer: Self-pay

## 2020-10-27 VITALS — BP 117/76 | HR 73 | Temp 97.7°F | Resp 16 | Ht 73.0 in | Wt 180.9 lb

## 2020-10-27 DIAGNOSIS — C8510 Unspecified B-cell lymphoma, unspecified site: Secondary | ICD-10-CM

## 2020-10-27 DIAGNOSIS — Z5181 Encounter for therapeutic drug level monitoring: Secondary | ICD-10-CM

## 2020-10-27 DIAGNOSIS — Z79620 Long term (current) use of immunosuppressive biologic: Secondary | ICD-10-CM

## 2020-10-27 DIAGNOSIS — Z79899 Other long term (current) drug therapy: Secondary | ICD-10-CM

## 2020-10-27 DIAGNOSIS — D701 Agranulocytosis secondary to cancer chemotherapy: Secondary | ICD-10-CM

## 2020-10-27 DIAGNOSIS — Z5111 Encounter for antineoplastic chemotherapy: Secondary | ICD-10-CM

## 2020-10-27 DIAGNOSIS — T451X5A Adverse effect of antineoplastic and immunosuppressive drugs, initial encounter: Secondary | ICD-10-CM

## 2020-10-27 LAB — COMPREHENSIVE METABOLIC PANEL
ALT: 19 U/L (ref 0–44)
AST: 24 U/L (ref 15–41)
Albumin: 3.8 g/dL (ref 3.5–5.0)
Alkaline Phosphatase: 67 U/L (ref 38–126)
Anion gap: 6 (ref 5–15)
BUN: 12 mg/dL (ref 8–23)
CO2: 26 mmol/L (ref 22–32)
Calcium: 9 mg/dL (ref 8.9–10.3)
Chloride: 106 mmol/L (ref 98–111)
Creatinine, Ser: 1.1 mg/dL (ref 0.61–1.24)
GFR, Estimated: >60 mL/min (ref >60)
Glucose, Bld: 144 mg/dL — ABNORMAL HIGH (ref 70–99)
Potassium: 3.3 mmol/L — ABNORMAL LOW (ref 3.5–5.1)
Sodium: 138 mmol/L (ref 135–145)
Total Bilirubin: 0.6 mg/dL (ref 0.3–1.2)
Total Protein: 6.4 g/dL — ABNORMAL LOW (ref 6.5–8.1)

## 2020-10-27 LAB — CBC WITH DIFFERENTIAL/PLATELET
Abs Immature Granulocytes: 0.01 10*3/uL (ref 0.00–0.07)
Basophils Absolute: 0 10*3/uL (ref 0.0–0.1)
Basophils Relative: 1 %
Eosinophils Absolute: 0.1 10*3/uL (ref 0.0–0.5)
Eosinophils Relative: 3 %
HCT: 33.5 % — ABNORMAL LOW (ref 39.0–52.0)
Hemoglobin: 11.4 g/dL — ABNORMAL LOW (ref 13.0–17.0)
Immature Granulocytes: 0 %
Lymphocytes Relative: 40 %
Lymphs Abs: 1.1 10*3/uL (ref 0.7–4.0)
MCH: 31.4 pg (ref 26.0–34.0)
MCHC: 34 g/dL (ref 30.0–36.0)
MCV: 92.3 fL (ref 80.0–100.0)
Monocytes Absolute: 0.5 10*3/uL (ref 0.1–1.0)
Monocytes Relative: 17 %
Neutro Abs: 1.1 10*3/uL — ABNORMAL LOW (ref 1.7–7.7)
Neutrophils Relative %: 39 %
Platelets: 346 10*3/uL (ref 150–400)
RBC: 3.63 MIL/uL — ABNORMAL LOW (ref 4.22–5.81)
RDW: 14.6 % (ref 11.5–15.5)
WBC: 2.7 10*3/uL — ABNORMAL LOW (ref 4.0–10.5)
nRBC: 0 % (ref 0.0–0.2)

## 2020-10-27 LAB — LACTATE DEHYDROGENASE: LDH: 143 U/L (ref 98–192)

## 2020-10-27 MED ORDER — ALLOPURINOL 300 MG PO TABS: 300.0000 mg | ORAL_TABLET | Freq: Every day | ORAL | 1 refills | Status: DC

## 2020-10-27 MED ORDER — PREDNISONE 50 MG PO TABS: ORAL_TABLET | ORAL | 0 refills | Status: DC

## 2020-10-27 MED ORDER — HEPARIN SOD (PORK) LOCK FLUSH 100 UNIT/ML IV SOLN
500.0000 [IU] | Freq: Once | INTRAVENOUS | Status: AC
  Administered 2020-10-27: 500 [IU] via INTRAVENOUS
  Filled 2020-10-27: qty 5

## 2020-10-27 NOTE — Progress Notes (Signed)
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  This was followed by a PET CT scan which showed extensive FDG avid adenopathy in the neck chest abdomen and pelvis.  FDG avid splenic lesions.  Solitary intramuscular FDG avid lesion in the right biceps femoris muscle.   Supraclavicular excisional lymph node biopsy showed high-grade B-cell lymphoma germinal center type Ki-67 greater than 95%.  FISH testing was positive for Bcl-2 BCL6 and MYC consistent with triple hit lymphoma.  By NCCN IPI score would be 4 based on age and elevated LDH and stage IV (intramuscular biceps femoris lesion) which puts him in the high intermediate risk group. 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   Patient received RCHOP for cycle 1 with plans for DA [facility] with cycle 2. IT MTX for CNS prophylaxis and he has received 3 cycles so far.   MRI brain negative for lymphoma.  Chemotherapy was not being able to escalated upwards due to neutropenia which has persisted to 3 weeks and he has been receiving treatment every 4 weeks   PET CT scan after 3 cycles of chemotherapy showedComplete or near complete  metabolic response.  Residual disease in the anterior upper right mediastinum slightly greater than background mediastinal activity  Interval history-patient reports he does not like how intrathecal methotrexate makes him feel.  He reports a chill that runs down his spine after the injection.  He also reports fatigue and he sometimes loses his voice after intrathecal methotrexate.  The symptoms lasted for a week last time as compared to 2 days after his prior treatments.  Patient does not think he can continue with intrathecal methotrexate at this time  ECOG PS- 0 Pain scale- 0   Review of systems- Review of Systems  Constitutional:  Negative for chills, fever, malaise/fatigue and weight loss.  HENT:  Negative for congestion, ear discharge and nosebleeds.   Eyes:  Negative for blurred vision.  Respiratory:  Negative for cough, hemoptysis, sputum production, shortness of breath and wheezing.   Cardiovascular:  Negative for chest pain, palpitations, orthopnea and claudication.  Gastrointestinal:  Negative for abdominal pain, blood in stool, constipation, diarrhea, heartburn, melena, nausea and vomiting.  Genitourinary:  Negative for dysuria, flank pain, frequency, hematuria and urgency.  Musculoskeletal:  Negative for back pain, joint pain and myalgias.  Skin:  Negative for rash.  Neurological:  Negative for dizziness, tingling, focal weakness, seizures, weakness and headaches.  Endo/Heme/Allergies:  Does not bruise/bleed easily.  Psychiatric/Behavioral:  Negative for depression and suicidal ideas. The patient does not have insomnia.       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/2020     Past Surgical History:  Procedure Laterality Date   BONE MARROW BIOPSY     COLONOSCOPY     EXCISION MASS NECK Right 06/12/2020   Procedure: EXCISION MASS NECK;   Surgeon: Jules Husbands, MD;  Location: [facility] ORS;  Service: General;  Laterality: Right;   HERNIA REPAIR Right    at age 45-RIH   67 [place] Right 06/24/2020   Procedure: INSERTION PORT-A-CATH;  Surgeon: Jules Husbands, MD;  Location: [facility] ORS;  Service: General;  Laterality: Right;    Social History   Socioeconomic History   Marital status: Single    Spouse name: Not on file   Number of children: Not on file   Years of education: Not on file   Highest education level: Not on file  Occupational History   Not on file  Tobacco Use   Smoking status: Former    Types: Cigarettes    Quit date: 06/13/2020    Years since quitting: 0.3   Smokeless tobacco: Never  Vaping Use   Vaping Use: Never used  Substance and Sexual Activity   Alcohol use: Not Currently   Drug use: Not Currently    Types: Marijuana   Sexual activity: Not Currently  Other Topics Concern   Not on file  Social History Narrative   Has daughter and grandson in the home   Social Determinants of Health   Financial Resource Strain: Not on file  Food Insecurity: Not on file  Transportation Needs: Not on file  Physical Activity: Not on file  Stress: Not on file  Social Connections: Not on file  Intimate Partner Violence: Not on file    Family History  Problem Relation Age of Onset   Anemia Mother    Hypertension Mother    Goiter Mother    Cancer Father    Cancer Sister    Multiple sclerosis Sister      Current Outpatient Medications:    atenolol (TENORMIN) 25 MG tablet, Take 25 mg by mouth daily., Disp: , Rfl:    lisinopril (ZESTRIL) 10 MG tablet, Take 10 mg by mouth daily., Disp: , Rfl:    allopurinol (ZYLOPRIM) 300 MG tablet, Take 1 tablet (300 mg total) by mouth daily., Disp: 30 tablet, Rfl: 1   HYDROcodone-acetaminophen (NORCO/VICODIN) 5-325 MG tablet, Take 1 tablet by mouth every 6 (six) hours as needed for moderate pain. (Patient not taking: No sig reported), Disp:  15 tablet, Rfl: 0   predniSONE (DELTASONE) 50 MG tablet, Take 2 tablets each day with food early in am for 5 days while on chemo treatment, Disp: 10 tablet, Rfl: 0   VITAMIN D PO, Take 1 capsule by mouth daily., Disp: , Rfl:  No current facility-administered medications for this visit.  Facility-Administered Medications Ordered in Other Visits:    0.9 %  sodium chloride infusion, , Intravenous, Continuous, Sindy Guadeloupe, MD, Stopped at 08/12/20 1310   0.9 %  sodium chloride infusion, , Intravenous, Continuous, Sindy Guadeloupe, MD, Stopped at 09/08/20 0944   0.9 %  sodium chloride infusion, , Intravenous, Continuous, Sindy Guadeloupe, MD, Stopped at 09/09/20 1507   0.9 %  sodium chloride infusion, , Intravenous, Continuous, Cammie Sickle, MD, Stopped at 09/11/20 1502   0.9 %  sodium chloride infusion, , Intravenous, Continuous, Sindy Guadeloupe, MD, Stopped at 10/06/20 1330   heparin lock flush 100 unit/mL, 500  Units, Intravenous, Once, Sindy Guadeloupe, MD   methotrexate (PF) 12 mg in sodium chloride (PF) 0.9 % INTRATHECAL chemo injection, , Intrathecal, Once, Sindy Guadeloupe, MD   sodium chloride flush (NS) 0.9 % injection 10 mL, 10 mL, Intravenous, PRN, Sindy Guadeloupe, MD, 10 mL at 08/13/20 1510  Physical exam:  Vitals:   10/27/20 0853  BP: 117/76  Pulse: 73  Resp: 16  Temp: 97.7 F (36.5 C)  TempSrc: Oral  Weight: 180 lb 14.4 oz (82.1 kg)  Height: '6\' 1"'  (1.854 m)   Physical Exam Cardiovascular:     Rate and Rhythm: Normal rate and regular rhythm.     Heart sounds: Normal heart sounds.  Pulmonary:     Effort: Pulmonary effort is normal.     Breath sounds: Normal breath sounds.  Abdominal:     General: Bowel sounds are normal.     Palpations: Abdomen is soft.     Comments: No palpable splenomegaly  Lymphadenopathy:     Comments: No palpable cervical, supraclavicular, axillary or inguinal adenopathy    Skin:    General: Skin is warm and dry.  Neurological:     Mental  Status: He is alert and oriented to person, place, and time.     CMP Latest Ref Rng & Units 10/27/2020  Glucose 70 - 99 mg/dL 144(H)  BUN 8 - 23 mg/dL 12  Creatinine 0.61 - 1.24 mg/dL 1.10  Sodium 135 - 145 mmol/L 138  Potassium 3.5 - 5.1 mmol/L 3.3(L)  Chloride 98 - 111 mmol/L 106  CO2 22 - 32 mmol/L 26  Calcium 8.9 - 10.3 mg/dL 9.0  Total Protein 6.5 - 8.1 g/dL 6.4(L)  Total Bilirubin 0.3 - 1.2 mg/dL 0.6  Alkaline Phos 38 - 126 U/L 67  AST 15 - 41 U/L 24  ALT 0 - 44 U/L 19   CBC Latest Ref Rng & Units 10/27/2020  WBC 4.0 - 10.5 K/uL 2.7(L)  Hemoglobin 13.0 - 17.0 g/dL 11.4(L)  Hematocrit 39.0 - 52.0 % 33.5(L)  Platelets 150 - 400 K/uL 346    No images are attached to the encounter.  DG FLUORO GUIDED LOC OF NEEDLE/CATH TIP FOR SPINAL INJECT LT  Result Date: 10/14/2020 CLINICAL DATA:  Lymphoma.  Requirement for intrathecal chemotherapy. EXAM: DIAGNOSTIC LUMBAR PUNCTURE UNDER FLUOROSCOPIC GUIDANCE INDICATION: Lymphoma.  Intrathecal chemotherapy. COMPARISON:  09/17/2020 FLUOROSCOPY TIME:  Fluoroscopy Time:  0 minutes and 54 seconds. Radiation Exposure Index (if provided by the fluoroscopic device): 28.2 mGy Number of Acquired Spot Images: PROCEDURE: Patient was alert and oriented x3. Prior to beginning the procedure, the risks, benefits, and alternatives to lumbar puncture were discussed with the patient. Emphasized risks included bleeding, infection, headache, allergy, and inability to obtain CSF. The patient voiced understanding and was given an opportunity to have all questions answered. Written informed consent was obtained from the patient prior to the procedure A patient time-out was performed prior to initiating the procedure. With the patient prone, appropriate skin sites over the lower back were identified using fluoroscopic guidance. The lower back was then prepped and draped in the usual sterile fashion. 1% Lidocaine was used for local anesthesia. Lumbar puncture was attempted at  the L3-4 level without success. Subsequently, lumbar puncture was performed at the L4-5 level using a 22 gauge needle with return of clear CSF. Approximately 5 cc of CSF was removed. Subsequently, the methotrexate solution, as provided by the pharmacy, was slowly injected into the thecal sac without difficulty. The patient tolerated  the procedure well and there were no apparent complications. IMPRESSION: Successful fluoro guided lumbar puncture for injection of intrathecal methotrexate. No evidence for immediate complications. Electronically Signed   By: Misty Stanley M.D.   On: 10/14/2020 10:17     Assessment and plan- Patient is a 60 y.o. male with stage IV triple hit high-grade B-cell lymphoma.  He is here for on treatment assessment prior to cycle 4 of dose adjusted R-EPOCH chemotherapy  Patient was admitted for neutropenic fever after cycle 1 of R-CHOP chemotherapy despite growth factor support.  Subsequently we have not been able to increase the dose of his chemotherapy and he has been receiving the dose adjusted R-EPOCH regimen every 4 weeks instead of every 3 weeks as his neutrophils are just close to 1.0 and therefore his chemotherapy has been delayed by 1 week each time.  His ANC is 1.1 today and therefore he will not receive his cycle 5 of R-EPOCH chemotherapy today and we will postpone his chemotherapy by 1 week.  We will check weekly CBC and I will see him back 5 weeks from now for cycle 6 of R-EPOCH chemotherapy which would be his last cycle.  Plan to repeat scans after 6 cycles.  Patient will also start taking 5 doses of steroids next week when he gets his chemotherapy.  Clinically he has responded very well to treatment so far  We also discussed that he is left with 2 more cycles of chemotherapy and 2 more cycles of intrathecal methotrexate.  Patient does not wish to proceed with any further intrathecal methotrexate at this time.  He does not like the side effects associated with it.  He  understands that this is potentially reducing the risk of CNS relapse but does not wish to proceed with intrathecal treatment.  Hepatitis B core antibody positive: On tenofovir.   Visit Diagnosis 1. High grade B-cell lymphoma ([place])   2. Encounter for antineoplastic chemotherapy   3. Encounter for monitoring rituximab therapy   4. Chemotherapy induced neutropenia ([facility])      Dr. Randa Evens, MD, MPH [facility] [phone] 10/27/2020 12:53 PM

## 2020-10-27 NOTE — Progress Notes (Signed)
Pt doing good, he notices that he gets hoarse and numbness when he has spinal tap with methotrexate

## 2020-10-28 ENCOUNTER — Inpatient Hospital Stay: Payer: Medicaid Other

## 2020-10-29 ENCOUNTER — Inpatient Hospital Stay: Payer: Medicaid Other

## 2020-10-30 ENCOUNTER — Inpatient Hospital Stay: Payer: Medicaid Other

## 2020-10-31 ENCOUNTER — Inpatient Hospital Stay: Payer: Medicaid Other

## 2020-11-03 ENCOUNTER — Inpatient Hospital Stay: Payer: Medicaid Other

## 2020-11-03 ENCOUNTER — Other Ambulatory Visit: Payer: Self-pay | Admitting: Oncology

## 2020-11-03 ENCOUNTER — Inpatient Hospital Stay: Payer: Medicaid Other | Admitting: Oncology

## 2020-11-03 ENCOUNTER — Ambulatory Visit: Payer: Medicaid Other

## 2020-11-03 VITALS — BP 137/75 | HR 58 | Temp 95.0°F | Resp 18 | Wt 184.2 lb

## 2020-11-03 DIAGNOSIS — C8510 Unspecified B-cell lymphoma, unspecified site: Secondary | ICD-10-CM

## 2020-11-03 LAB — CBC WITH DIFFERENTIAL/PLATELET
Abs Immature Granulocytes: 0.04 10*3/uL (ref 0.00–0.07)
Basophils Absolute: 0 10*3/uL (ref 0.0–0.1)
Basophils Relative: 1 %
Eosinophils Absolute: 0.1 10*3/uL (ref 0.0–0.5)
Eosinophils Relative: 3 %
HCT: 35.3 % — ABNORMAL LOW (ref 39.0–52.0)
Hemoglobin: 12 g/dL — ABNORMAL LOW (ref 13.0–17.0)
Immature Granulocytes: 1 %
Lymphocytes Relative: 36 %
Lymphs Abs: 1.4 10*3/uL (ref 0.7–4.0)
MCH: 31.3 pg (ref 26.0–34.0)
MCHC: 34 g/dL (ref 30.0–36.0)
MCV: 91.9 fL (ref 80.0–100.0)
Monocytes Absolute: 0.7 10*3/uL (ref 0.1–1.0)
Monocytes Relative: 18 %
Neutro Abs: 1.6 10*3/uL — ABNORMAL LOW (ref 1.7–7.7)
Neutrophils Relative %: 41 %
Platelets: 374 10*3/uL (ref 150–400)
RBC: 3.84 MIL/uL — ABNORMAL LOW (ref 4.22–5.81)
RDW: 14.7 % (ref 11.5–15.5)
WBC: 3.9 10*3/uL — ABNORMAL LOW (ref 4.0–10.5)
nRBC: 0 % (ref 0.0–0.2)

## 2020-11-03 LAB — COMPREHENSIVE METABOLIC PANEL
ALT: 21 U/L (ref 0–44)
AST: 27 U/L (ref 15–41)
Albumin: 3.9 g/dL (ref 3.5–5.0)
Alkaline Phosphatase: 67 U/L (ref 38–126)
Anion gap: 7 (ref 5–15)
BUN: 14 mg/dL (ref 8–23)
CO2: 25 mmol/L (ref 22–32)
Calcium: 8.8 mg/dL — ABNORMAL LOW (ref 8.9–10.3)
Chloride: 107 mmol/L (ref 98–111)
Creatinine, Ser: 0.97 mg/dL (ref 0.61–1.24)
GFR, Estimated: >60 mL/min (ref >60)
Glucose, Bld: 94 mg/dL (ref 70–99)
Potassium: 3.6 mmol/L (ref 3.5–5.1)
Sodium: 139 mmol/L (ref 135–145)
Total Bilirubin: 0.6 mg/dL (ref 0.3–1.2)
Total Protein: 6.4 g/dL — ABNORMAL LOW (ref 6.5–8.1)

## 2020-11-03 MED ORDER — VINCRISTINE SULFATE CHEMO INJECTION 1 MG/ML
Freq: Once | INTRAVENOUS | Status: AC
  Filled 2020-11-03: qty 10

## 2020-11-03 MED ORDER — SODIUM CHLORIDE 0.9 % IV SOLN
Freq: Once | INTRAVENOUS | Status: AC
  Administered 2020-11-03: 10 mg via INTRAVENOUS
  Filled 2020-11-03: qty 4

## 2020-11-03 MED ORDER — SODIUM CHLORIDE 0.9 % IV SOLN
375.0000 mg/m2 | Freq: Once | INTRAVENOUS | Status: AC
  Administered 2020-11-03: 800 mg via INTRAVENOUS
  Filled 2020-11-03: qty 50

## 2020-11-03 MED ORDER — SODIUM CHLORIDE 0.9% FLUSH
10.0000 mL | Freq: Once | INTRAVENOUS | Status: AC
  Administered 2020-11-03: 10 mL via INTRAVENOUS
  Filled 2020-11-03: qty 10

## 2020-11-03 MED ORDER — ACETAMINOPHEN 325 MG PO TABS
650.0000 mg | ORAL_TABLET | Freq: Once | ORAL | Status: AC
  Administered 2020-11-03: 650 mg via ORAL
  Filled 2020-11-03: qty 2

## 2020-11-03 MED ORDER — SODIUM CHLORIDE 0.9 % IV SOLN
INTRAVENOUS | Status: DC
  Filled 2020-11-03: qty 250

## 2020-11-03 MED ORDER — DIPHENHYDRAMINE HCL 25 MG PO CAPS
50.0000 mg | ORAL_CAPSULE | Freq: Once | ORAL | Status: AC
  Administered 2020-11-03: 50 mg via ORAL
  Filled 2020-11-03: qty 2

## 2020-11-03 NOTE — Patient Instructions (Addendum)
[facility] ONCOLOG[facility]  Discharge Instructions: Thank you for choosing [facility] to provide your oncology and hematology care.  If you have a lab appointment with the [facility], please go directly to the [facility] and check in at the registration area.  Wear comfortable clothing and clothing appropriate for easy access to any Portacath or PICC line.   We strive to give you quality time with your provider. You may need to reschedule your appointment if you arrive late (15 or more minutes).  Arriving late affects you and other patients whose appointments are after yours.  Also, if you miss three or more appointments without notifying the office, you may be dismissed from the clinic at the provider's discretion.      For prescription refill requests, have your pharmacy contact our office and allow 72 hours for refills to be completed.    Today you received the following chemotherapy and/or immunotherapy agents Rituxan, adriamycin, etoposide, vincristine    To help prevent nausea and vomiting after your treatment, we encourage you to take your nausea medication as directed.  BELOW ARE SYMPTOMS THAT SHOULD BE REPORTED IMMEDIATELY: *FEVER GREATER THAN 100.4 F (38 C) OR HIGHER *CHILLS OR SWEATING *NAUSEA AND VOMITING THAT IS NOT CONTROLLED WITH YOUR NAUSEA MEDICATION *UNUSUAL SHORTNESS OF BREATH *UNUSUAL BRUISING OR BLEEDING *URINARY PROBLEMS (pain or burning when urinating, or frequent urination) *BOWEL PROBLEMS (unusual diarrhea, constipation, pain near the anus) TENDERNESS IN MOUTH AND THROAT WITH OR WITHOUT PRESENCE OF ULCERS (sore throat, sores in mouth, or a toothache) UNUSUAL RASH, SWELLING OR PAIN  UNUSUAL VAGINAL DISCHARGE OR ITCHING   Items with * indicate a potential emergency and should be followed up as soon as possible or go to the Emergency Department if any problems should occur.  Please show the CHEMOTHERAPY ALERT CARD or  IMMUNOTHERAPY ALERT CARD at check-in to the Emergency Department and triage nurse.  Should you have questions after your visit or need to cancel or reschedule your appointment, please contact [facility]  [phone] and follow the prompts.  Office hours are 8:00 a.m. to 4:30 p.m. Monday - Friday. Please note that voicemails left after 4:00 p.m. may not be returned until the following business day.  We are closed weekends and major holidays. You have access to a nurse at all times for urgent questions. Please call the main number to the clinic [phone] and follow the prompts.  For any non-urgent questions, you may also contact your provider using MyChart. We now offer e-Visits for anyone 69 and older to request care online for non-urgent symptoms. For details visit [email or web address].   Also download the MyChart app! Go to the app store, search "MyChart", open the app, select [facility], and log in with your MyChart username and password.  Due to Covid, a mask is required upon entering the hospital/clinic. If you do not have a mask, one will be given to you upon arrival. For doctor visits, patients may have 1 support person aged 55 or older with them. For treatment visits, patients cannot have anyone with them due to current Covid guidelines and our immunocompromised population.

## 2020-11-04 ENCOUNTER — Inpatient Hospital Stay: Payer: Medicaid Other

## 2020-11-04 VITALS — BP 124/66 | HR 82 | Temp 98.0°F | Resp 18

## 2020-11-04 DIAGNOSIS — C8510 Unspecified B-cell lymphoma, unspecified site: Secondary | ICD-10-CM

## 2020-11-04 DIAGNOSIS — Z95828 Presence of other vascular implants and grafts: Secondary | ICD-10-CM

## 2020-11-04 MED ORDER — SODIUM CHLORIDE 0.9 % IV SOLN
Freq: Once | INTRAVENOUS | Status: AC
  Filled 2020-11-04: qty 250

## 2020-11-04 MED ORDER — SODIUM CHLORIDE 0.9 % IV SOLN
Freq: Once | INTRAVENOUS | Status: AC
  Administered 2020-11-04: 8 mg via INTRAVENOUS
  Filled 2020-11-04: qty 4

## 2020-11-04 MED ORDER — VINCRISTINE SULFATE CHEMO INJECTION 1 MG/ML
Freq: Once | INTRAVENOUS | Status: AC
  Filled 2020-11-04: qty 10

## 2020-11-04 MED ORDER — HEPARIN SOD (PORK) LOCK FLUSH 100 UNIT/ML IV SOLN
500.0000 [IU] | Freq: Once | INTRAVENOUS | Status: DC
  Filled 2020-11-04: qty 5

## 2020-11-04 MED ORDER — SODIUM CHLORIDE 0.9% FLUSH
10.0000 mL | INTRAVENOUS | Status: DC | PRN
  Administered 2020-11-04: 10 mL via INTRAVENOUS
  Filled 2020-11-04: qty 10

## 2020-11-04 NOTE — Patient Instructions (Signed)
[facility]COL[facility]   Discharge Instructions: Thank you for choosing [facility] to provide your oncology and hematology care.  If you have a lab appointment with the [facility], please go directly to the [facility] and check in at the registration area.  Wear comfortable clothing and clothing appropriate for easy access to any Portacath or PICC line.   We strive to give you quality time with your provider. You may need to reschedule your appointment if you arrive late (15 or more minutes).  Arriving late affects you and other patients whose appointments are after yours.  Also, if you miss three or more appointments without notifying the office, you may be dismissed from the clinic at the provider's discretion.      For prescription refill requests, have your pharmacy contact our office and allow 72 hours for refills to be completed.    Today you received the following chemotherapy and/or immunotherapy agents: Adriamycin, Etoposide, Vincristine.      To help prevent nausea and vomiting after your treatment, we encourage you to take your nausea medication as directed.  BELOW ARE SYMPTOMS THAT SHOULD BE REPORTED IMMEDIATELY: *FEVER GREATER THAN 100.4 F (38 C) OR HIGHER *CHILLS OR SWEATING *NAUSEA AND VOMITING THAT IS NOT CONTROLLED WITH YOUR NAUSEA MEDICATION *UNUSUAL SHORTNESS OF BREATH *UNUSUAL BRUISING OR BLEEDING *URINARY PROBLEMS (pain or burning when urinating, or frequent urination) *BOWEL PROBLEMS (unusual diarrhea, constipation, pain near the anus) TENDERNESS IN MOUTH AND THROAT WITH OR WITHOUT PRESENCE OF ULCERS (sore throat, sores in mouth, or a toothache) UNUSUAL RASH, SWELLING OR PAIN  UNUSUAL VAGINAL DISCHARGE OR ITCHING   Items with * indicate a potential emergency and should be followed up as soon as possible or go to the Emergency Department if any problems should occur.  Please show the CHEMOTHERAPY ALERT CARD or  IMMUNOTHERAPY ALERT CARD at check-in to the Emergency Department and triage nurse.  Should you have questions after your visit or need to cancel or reschedule your appointment, please contact [facility]  [phone] and follow the prompts.  Office hours are 8:00 a.m. to 4:30 p.m. Monday - Friday. Please note that voicemails left after 4:00 p.m. may not be returned until the following business day.  We are closed weekends and major holidays. You have access to a nurse at all times for urgent questions. Please call the main number to the clinic [phone] and follow the prompts.  For any non-urgent questions, you may also contact your provider using MyChart. We now offer e-Visits for anyone 66 and older to request care online for non-urgent symptoms. For details visit [email or web address].   Also download the MyChart app! Go to the app store, search "MyChart", open the app, select [facility], and log in with your MyChart username and password.  Due to Covid, a mask is required upon entering the hospital/clinic. If you do not have a mask, one will be given to you upon arrival. For doctor visits, patients may have 1 support person aged 18 or older with them. For treatment visits, patients cannot have anyone with them due to current Covid guidelines and our immunocompromised population.

## 2020-11-05 ENCOUNTER — Inpatient Hospital Stay: Payer: Medicaid Other

## 2020-11-05 VITALS — BP 147/84 | HR 80 | Temp 97.0°F | Resp 20

## 2020-11-05 DIAGNOSIS — C8510 Unspecified B-cell lymphoma, unspecified site: Secondary | ICD-10-CM

## 2020-11-05 MED ORDER — SODIUM CHLORIDE 0.9 % IV SOLN
Freq: Once | INTRAVENOUS | Status: AC
  Filled 2020-11-05: qty 250

## 2020-11-05 MED ORDER — SODIUM CHLORIDE 0.9 % IV SOLN
Freq: Once | INTRAVENOUS | Status: AC
  Administered 2020-11-05: 8 mg via INTRAVENOUS
  Filled 2020-11-05: qty 4

## 2020-11-05 MED ORDER — VINCRISTINE SULFATE CHEMO INJECTION 1 MG/ML
Freq: Once | INTRAVENOUS | Status: AC
  Filled 2020-11-05: qty 10

## 2020-11-05 NOTE — Patient Instructions (Signed)
CANCER CENTER [facility] MEDICAL [facility]COLOGY  Discharge Instructions: Thank you for choosing [facility] to provide your oncology and hematology care.  If you have a lab appointment with the Cancer Center, please go directly to the Cancer Center and check in at the registration area.  Wear comfortable clothing and clothing appropriate for easy access to any Portacath or PICC line.   We strive to give you quality time with your provider. You may need to reschedule your appointment if you arrive late (15 or more minutes).  Arriving late affects you and other patients whose appointments are after yours.  Also, if you miss three or more appointments without notifying the office, you may be dismissed from the clinic at the provider's discretion.      For prescription refill requests, have your pharmacy contact our office and allow 72 hours for refills to be completed.      To help prevent nausea and vomiting after your treatment, we encourage you to take your nausea medication as directed.  BELOW ARE SYMPTOMS THAT SHOULD BE REPORTED IMMEDIATELY: *FEVER GREATER THAN 100.4 F (38 C) OR HIGHER *CHILLS OR SWEATING *NAUSEA AND VOMITING THAT IS NOT CONTROLLED WITH YOUR NAUSEA MEDICATION *UNUSUAL SHORTNESS OF BREATH *UNUSUAL BRUISING OR BLEEDING *URINARY PROBLEMS (pain or burning when urinating, or frequent urination) *BOWEL PROBLEMS (unusual diarrhea, constipation, pain near the anus) TENDERNESS IN MOUTH AND THROAT WITH OR WITHOUT PRESENCE OF ULCERS (sore throat, sores in mouth, or a toothache) UNUSUAL RASH, SWELLING OR PAIN  UNUSUAL VAGINAL DISCHARGE OR ITCHING   Items with * indicate a potential emergency and should be followed up as soon as possible or go to the Emergency Department if any problems should occur.  Please show the CHEMOTHERAPY ALERT CARD or IMMUNOTHERAPY ALERT CARD at check-in to the Emergency Department and triage nurse.  Should you have questions after your  visit or need to cancel or reschedule your appointment, please contact CANCER CENTER [facility]COL[facility]  [phone] and follow the prompts.  Office hours are 8:00 a.m. to 4:30 p.m. Monday - Friday. Please note that voicemails left after 4:00 p.m. may not be returned until the following business day.  We are closed weekends and major holidays. You have access to a nurse at all times for urgent questions. Please call the main number to the clinic [phone] and follow the prompts.  For any non-urgent questions, you may also contact your provider using MyChart. We now offer e-Visits for anyone 18 and older to request care online for non-urgent symptoms. For details visit [email or web address].   Also download the MyChart app! Go to the app store, search "MyChart", open the app, select [facility], and log in with your MyChart username and password.  Due to Covid, a mask is required upon entering the hospital/clinic. If you do not have a mask, one will be given to you upon arrival. For doctor visits, patients may have 1 support person aged 18 or older with them. For treatment visits, patients cannot have anyone with them due to current Covid guidelines and our immunocompromised population.  

## 2020-11-06 ENCOUNTER — Other Ambulatory Visit: Payer: Self-pay

## 2020-11-06 ENCOUNTER — Inpatient Hospital Stay: Payer: Medicaid Other

## 2020-11-06 VITALS — BP 140/75 | HR 89 | Temp 96.8°F | Resp 18

## 2020-11-06 DIAGNOSIS — C8510 Unspecified B-cell lymphoma, unspecified site: Secondary | ICD-10-CM

## 2020-11-06 MED ORDER — VINCRISTINE SULFATE CHEMO INJECTION 1 MG/ML
Freq: Once | INTRAVENOUS | Status: AC
  Filled 2020-11-06: qty 10

## 2020-11-06 MED ORDER — SODIUM CHLORIDE 0.9 % IV SOLN
Freq: Once | INTRAVENOUS | Status: AC
  Administered 2020-11-06: 10 mg via INTRAVENOUS
  Filled 2020-11-06: qty 4

## 2020-11-06 MED ORDER — SODIUM CHLORIDE 0.9 % IV SOLN
INTRAVENOUS | Status: DC
  Filled 2020-11-06: qty 250

## 2020-11-07 ENCOUNTER — Inpatient Hospital Stay: Payer: Medicaid Other

## 2020-11-07 VITALS — BP 131/77 | HR 75 | Temp 96.4°F | Resp 16

## 2020-11-07 DIAGNOSIS — C8510 Unspecified B-cell lymphoma, unspecified site: Secondary | ICD-10-CM

## 2020-11-07 MED ORDER — SODIUM CHLORIDE 0.9 % IV SOLN
Freq: Once | INTRAVENOUS | Status: AC
  Filled 2020-11-07: qty 250

## 2020-11-07 MED ORDER — SODIUM CHLORIDE 0.9 % IV SOLN
739.0000 mg/m2 | Freq: Once | INTRAVENOUS | Status: AC
  Administered 2020-11-07: 1500 mg via INTRAVENOUS
  Filled 2020-11-07: qty 75

## 2020-11-07 MED ORDER — HEPARIN SOD (PORK) LOCK FLUSH 100 UNIT/ML IV SOLN
INTRAVENOUS | Status: AC
  Filled 2020-11-07: qty 5

## 2020-11-07 MED ORDER — PEGFILGRASTIM 6 MG/0.6ML ~~LOC~~ PSKT
6.0000 mg | PREFILLED_SYRINGE | Freq: Once | SUBCUTANEOUS | Status: AC
  Administered 2020-11-07: 6 mg via SUBCUTANEOUS
  Filled 2020-11-07: qty 0.6

## 2020-11-07 MED ORDER — SODIUM CHLORIDE 0.9 % IV SOLN
Freq: Once | INTRAVENOUS | Status: AC
  Administered 2020-11-07: 16 mg via INTRAVENOUS
  Filled 2020-11-07: qty 8

## 2020-11-07 NOTE — Progress Notes (Signed)
Pt tolerated all infusions well today with no problems or complaints.  Pt left infusion suite stable and ambulatory.  

## 2020-11-07 NOTE — Patient Instructions (Signed)
[name]  Discharge Instructions: Thank you for choosing [facility] to provide your oncology and hematology care.  If you have a lab appointment with the [facility], please go directly to the [facility] and check in at the registration area.  Wear comfortable clothing and clothing appropriate for easy access to any Portacath or PICC line.   We strive to give you quality time with your provider. You may need to reschedule your appointment if you arrive late (15 or more minutes).  Arriving late affects you and other patients whose appointments are after yours.  Also, if you miss three or more appointments without notifying the office, you may be dismissed from the clinic at the provider's discretion.      For prescription refill requests, have your pharmacy contact our office and allow 72 hours for refills to be completed.    Today you received the following chemotherapy and/or immunotherapy agents cytoxan, neulasta      To help prevent nausea and vomiting after your treatment, we encourage you to take your nausea medication as directed.  BELOW ARE SYMPTOMS THAT SHOULD BE REPORTED IMMEDIATELY: *FEVER GREATER THAN 100.4 F (38 C) OR HIGHER *CHILLS OR SWEATING *NAUSEA AND VOMITING THAT IS NOT CONTROLLED WITH YOUR NAUSEA MEDICATION *UNUSUAL SHORTNESS OF BREATH *UNUSUAL BRUISING OR BLEEDING *URINARY PROBLEMS (pain or burning when urinating, or frequent urination) *BOWEL PROBLEMS (unusual diarrhea, constipation, pain near the anus) TENDERNESS IN MOUTH AND THROAT WITH OR WITHOUT PRESENCE OF ULCERS (sore throat, sores in mouth, or a toothache) UNUSUAL RASH, SWELLING OR PAIN  UNUSUAL VAGINAL DISCHARGE OR ITCHING   Items with * indicate a potential emergency and should be followed up as soon as possible or go to the Emergency Department if any problems should occur.  Please show the CHEMOTHERAPY ALERT CARD or IMMUNOTHERAPY ALERT CARD at  check-in to the Emergency Department and triage nurse.  Should you have questions after your visit or need to cancel or reschedule your appointment, please contact [facility]  [phone] and follow the prompts.  Office hours are 8:00 a.m. to 4:30 p.m. Monday - Friday. Please note that voicemails left after 4:00 p.m. may not be returned until the following business day.  We are closed weekends and major holidays. You have access to a nurse at all times for urgent questions. Please call the main number to the clinic [phone] and follow the prompts.  For any non-urgent questions, you may also contact your provider using MyChart. We now offer e-Visits for anyone 60 and older to request care online for non-urgent symptoms. For details visit [email or web address].   Also download the MyChart app! Go to the app store, search "MyChart", open the app, select [facility], and log in with your MyChart username and password.  Due to Covid, a mask is required upon entering the hospital/clinic. If you do not have a mask, one will be given to you upon arrival. For doctor visits, patients may have 1 support person aged 72 or older with them. For treatment visits, patients cannot have anyone with them due to current Covid guidelines and our immunocompromised population.   Cyclophosphamide Injection What is this medication? CYCLOPHOSPHAMIDE (sye kloe FOSS fa mide) is a chemotherapy drug. It slows the growth of cancer cells. This medicine is used to treat many types of cancer like lymphoma, myeloma, leukemia, breast cancer, and ovarian cancer, to name afew. This medicine may be used for other purposes; ask  your health care provider orpharmacist if you have questions. COMMON BRAND NAME(S): Cytoxan, Neosar What should I tell my care team before I take this medication? They need to know if you have any of these conditions: heart disease history of irregular  heartbeat infection kidney disease liver disease low blood counts, like white cells, platelets, or red blood cells on hemodialysis recent or ongoing radiation therapy scarring or thickening of the lungs trouble passing urine an unusual or allergic reaction to cyclophosphamide, other medicines, foods, dyes, or preservatives pregnant or trying to get pregnant breast-feeding How should I use this medication? This drug is usually given as an injection into a vein or muscle or by infusion into a vein. It is administered in a hospital or clinic by a specially trainedhealth care professional. Talk to your pediatrician regarding the use of this medicine in children.Special care may be needed. Overdosage: If you think you have taken too much of this medicine contact apoison control center or emergency room at once. NOTE: This medicine is only for you. Do not share this medicine with others. What if I miss a dose? It is important not to miss your dose. Call your doctor or health careprofessional if you are unable to keep an appointment. What may interact with this medication? amphotericin B azathioprine certain antivirals for HIV or hepatitis certain medicines for blood pressure, heart disease, irregular heart beat certain medicines that treat or prevent blood clots like warfarin certain other medicines for cancer cyclosporine etanercept indomethacin medicines that relax muscles for surgery medicines to increase blood counts metronidazole This list may not describe all possible interactions. Give your health care provider a list of all the medicines, herbs, non-prescription drugs, or dietary supplements you use. Also tell them if you smoke, drink alcohol, or use illegaldrugs. Some items may interact with your medicine. What should I watch for while using this medication? Your condition will be monitored carefully while you are receiving thismedicine. You may need blood work done while you  are taking this medicine. Drink water or other fluids as directed. Urinate often, even at night. Some products may contain alcohol. Ask your health care professional if this medicine contains alcohol. Be sure to tell all health care professionals you are taking this medicine. Certain medicines, like metronidazole and disulfiram, can cause an unpleasant reaction when taken with alcohol. The reaction includes flushing, headache, nausea, vomiting, sweating, and increased thirst. Thereaction can last from 30 minutes to several hours. Do not become pregnant while taking this medicine or for 1 year after stopping it. Women should inform their health care professional if they wish to become pregnant or think they might be pregnant. Men should not father a child while taking this medicine and for 4 months after stopping it. There is potential for serious side effects to an unborn child. Talk to your health care professionalfor more information. Do not breast-feed an infant while taking this medicine or for 1 week afterstopping it. This medicine has caused ovarian failure in some women. This medicine may make it more difficult to get pregnant. Talk to your health care professional if [place] concerned about your fertility. This medicine has caused decreased sperm counts in some men. This may make it more difficult to father a child. Talk to your health care professional if [place] concerned about your fertility. Call your health care professional for advice if you get a fever, chills, or sore throat, or other symptoms of a cold or flu. Do not treat yourself. This medicine  decreases your body's ability to fight infections. Try to avoid beingaround people who are sick. Avoid taking medicines that contain aspirin, acetaminophen, ibuprofen, naproxen, or ketoprofen unless instructed by your health care professional.These medicines may hide a fever. Talk to your health care professional about your risk of cancer. You may  bemore at risk for certain types of cancer if you take this medicine. If you are going to need surgery or other procedure, tell your health careprofessional that you are using this medicine. Be careful brushing or flossing your teeth or using a toothpick because you may get an infection or bleed more easily. If you have any dental work done, Primary school teacher you are receiving this medicine. What side effects may I notice from receiving this medication? Side effects that you should report to your doctor or health care professionalas soon as possible: allergic reactions like skin rash, itching or hives, swelling of the face, lips, or tongue breathing problems nausea, vomiting signs and symptoms of bleeding such as bloody or black, tarry stools; red or dark brown urine; spitting up blood or brown material that looks like coffee grounds; red spots on the skin; unusual bruising or bleeding from the eyes, gums, or nose signs and symptoms of heart failure like fast, irregular heartbeat, sudden weight gain; swelling of the ankles, feet, hands signs and symptoms of infection like fever; chills; cough; sore throat; pain or trouble passing urine signs and symptoms of kidney injury like trouble passing urine or change in the amount of urine signs and symptoms of liver injury like dark yellow or brown urine; general ill feeling or flu-like symptoms; light-colored stools; loss of appetite; nausea; right upper belly pain; unusually weak or tired; yellowing of the eyes or skin Side effects that usually do not require medical attention (report to yourdoctor or health care professional if they continue or are bothersome): confusion decreased hearing diarrhea facial flushing hair loss headache loss of appetite missed menstrual periods signs and symptoms of low red blood cells or anemia such as unusually weak or tired; feeling faint or lightheaded; falls skin discoloration This list may not describe all possible  side effects. Call your doctor for medical advice about side effects. You may report side effects to FDA at1-800-FDA-1088. Where should I keep my medication? This drug is given in a hospital or clinic and will not be stored at home. NOTE: This sheet is a summary. It may not cover all possible information. If you have questions about this medicine, talk to your doctor, pharmacist, orhealth care provider.  2022 Elsevier/Gold Standard (2018-12-04 09:53:29)  Pegfilgrastim injection What is this medication? PEGFILGRASTIM (PEG fil gra stim) is a long-acting granulocyte colony-stimulating factor that stimulates the growth of neutrophils, a type of white blood cell important in the body's fight against infection. It is used to reduce the incidence of fever and infection in patients with certain types of cancer who are receiving chemotherapy that affects the bone marrow, and toincrease survival after being exposed to high doses of radiation. This medicine may be used for other purposes; ask your health care provider orpharmacist if you have questions. COMMON BRAND NAME(S): Rexene Edison, Ziextenzo What should I tell my care team before I take this medication? They need to know if you have any of these conditions: kidney disease latex allergy ongoing radiation therapy sickle cell disease skin reactions to acrylic adhesives (On-Body Injector only) an unusual or allergic reaction to pegfilgrastim, filgrastim, other medicines, foods, dyes, or preservatives pregnant or trying  to get pregnant breast-feeding How should I use this medication? This medicine is for injection under the skin. If you get this medicine at home, you will be taught how to prepare and give the pre-filled syringe or how to use the On-body Injector. Refer to the patient Instructions for Use for detailed instructions. Use exactly as directed. Tell your healthcare provider immediately if you suspect that the On-body  Injector may not have performed as intended or if you suspect the use of the On-body Injector resulted in a missedor partial dose. It is important that you put your used needles and syringes in a special sharps container. Do not put them in a trash can. If you do not have a sharpscontainer, call your pharmacist or healthcare provider to get one. Talk to your pediatrician regarding the use of this medicine in children. Whilethis drug may be prescribed for selected conditions, precautions do apply. Overdosage: If you think you have taken too much of this medicine contact apoison control center or emergency room at once. NOTE: This medicine is only for you. Do not share this medicine with others. What if I miss a dose? It is important not to miss your dose. Call your doctor or health care professional if you miss your dose. If you miss a dose due to an On-body Injector failure or leakage, a new dose should be administered as soon aspossible using a single prefilled syringe for manual use. What may interact with this medication? Interactions have not been studied. This list may not describe all possible interactions. Give your health care provider a list of all the medicines, herbs, non-prescription drugs, or dietary supplements you use. Also tell them if you smoke, drink alcohol, or use illegaldrugs. Some items may interact with your medicine. What should I watch for while using this medication? Your condition will be monitored carefully while you are receiving thismedicine. You may need blood work done while you are taking this medicine. Talk to your health care provider about your risk of cancer. You may be more atrisk for certain types of cancer if you take this medicine. If you are going to need a MRI, CT scan, or other procedure, tell your doctorthat you are using this medicine (On-Body Injector only). What side effects may I notice from receiving this medication? Side effects that you should  report to your doctor or health care professionalas soon as possible: allergic reactions (skin rash, itching or hives, swelling of the face, lips, or tongue) back pain dizziness fever pain, redness, or irritation at site where injected pinpoint red spots on the skin red or dark-brown urine shortness of breath or breathing problems stomach or side pain, or pain at the shoulder swelling tiredness trouble passing urine or change in the amount of urine unusual bruising or bleeding Side effects that usually do not require medical attention (report to yourdoctor or health care professional if they continue or are bothersome): bone pain muscle pain This list may not describe all possible side effects. Call your doctor for medical advice about side effects. You may report side effects to FDA at1-800-FDA-1088. Where should I keep my medication? Keep out of the reach of children. If you are using this medicine at home, you will be instructed on how to storeit. Throw away any unused medicine after the expiration date on the label. NOTE: This sheet is a summary. It may not cover all possible information. If you have questions about this medicine, talk to your doctor, pharmacist, orhealth care provider.  2022 Elsevier/Gold Standard (2020-03-28 11:54:14)

## 2020-11-10 ENCOUNTER — Other Ambulatory Visit: Payer: Self-pay

## 2020-11-10 ENCOUNTER — Inpatient Hospital Stay: Payer: Medicaid Other

## 2020-11-10 DIAGNOSIS — C8510 Unspecified B-cell lymphoma, unspecified site: Secondary | ICD-10-CM

## 2020-11-10 LAB — CBC WITH DIFFERENTIAL/PLATELET
Abs Immature Granulocytes: 0.6 10*3/uL — ABNORMAL HIGH (ref 0.00–0.07)
Band Neutrophils: 5 %
Basophils Absolute: 0 10*3/uL (ref 0.0–0.1)
Basophils Relative: 0 %
Eosinophils Absolute: 0.3 10*3/uL (ref 0.0–0.5)
Eosinophils Relative: 1 %
HCT: 30.7 % — ABNORMAL LOW (ref 39.0–52.0)
Hemoglobin: 11 g/dL — ABNORMAL LOW (ref 13.0–17.0)
Lymphocytes Relative: 3 %
Lymphs Abs: 0.9 10*3/uL (ref 0.7–4.0)
MCH: 31.9 pg (ref 26.0–34.0)
MCHC: 35.8 g/dL (ref 30.0–36.0)
MCV: 89 fL (ref 80.0–100.0)
Metamyelocytes Relative: 2 %
Monocytes Absolute: 0.3 10*3/uL (ref 0.1–1.0)
Monocytes Relative: 1 %
Neutro Abs: 27 10*3/uL — ABNORMAL HIGH (ref 1.7–7.7)
Neutrophils Relative %: 88 %
Platelets: 169 10*3/uL (ref 150–400)
RBC: 3.45 MIL/uL — ABNORMAL LOW (ref 4.22–5.81)
RDW: 14.2 % (ref 11.5–15.5)
Smear Review: NORMAL
WBC: 29 10*3/uL — ABNORMAL HIGH (ref 4.0–10.5)
nRBC: 0 % (ref 0.0–0.2)

## 2020-11-18 ENCOUNTER — Other Ambulatory Visit: Payer: Self-pay

## 2020-11-18 ENCOUNTER — Inpatient Hospital Stay: Payer: Self-pay | Attending: Oncology

## 2020-11-18 ENCOUNTER — Telehealth: Payer: Self-pay | Admitting: Oncology

## 2020-11-18 DIAGNOSIS — Z832 Family history of diseases of the blood and blood-forming organs and certain disorders involving the immune mechanism: Secondary | ICD-10-CM | POA: Insufficient documentation

## 2020-11-18 DIAGNOSIS — C8511 Unspecified B-cell lymphoma, lymph nodes of head, face, and neck: Secondary | ICD-10-CM | POA: Insufficient documentation

## 2020-11-18 DIAGNOSIS — D709 Neutropenia, unspecified: Secondary | ICD-10-CM | POA: Insufficient documentation

## 2020-11-18 DIAGNOSIS — C8510 Unspecified B-cell lymphoma, unspecified site: Secondary | ICD-10-CM

## 2020-11-18 DIAGNOSIS — Z8249 Family history of ischemic heart disease and other diseases of the circulatory system: Secondary | ICD-10-CM | POA: Insufficient documentation

## 2020-11-18 DIAGNOSIS — Z87891 Personal history of nicotine dependence: Secondary | ICD-10-CM | POA: Insufficient documentation

## 2020-11-18 DIAGNOSIS — Z5112 Encounter for antineoplastic immunotherapy: Secondary | ICD-10-CM | POA: Insufficient documentation

## 2020-11-18 DIAGNOSIS — R7402 Elevation of levels of lactic acid dehydrogenase (LDH): Secondary | ICD-10-CM | POA: Insufficient documentation

## 2020-11-18 DIAGNOSIS — R0602 Shortness of breath: Secondary | ICD-10-CM | POA: Insufficient documentation

## 2020-11-18 DIAGNOSIS — N179 Acute kidney failure, unspecified: Secondary | ICD-10-CM | POA: Insufficient documentation

## 2020-11-18 DIAGNOSIS — Z5111 Encounter for antineoplastic chemotherapy: Secondary | ICD-10-CM | POA: Insufficient documentation

## 2020-11-18 DIAGNOSIS — Z79899 Other long term (current) drug therapy: Secondary | ICD-10-CM | POA: Insufficient documentation

## 2020-11-18 DIAGNOSIS — Z809 Family history of malignant neoplasm, unspecified: Secondary | ICD-10-CM | POA: Insufficient documentation

## 2020-11-18 DIAGNOSIS — Z8269 Family history of other diseases of the musculoskeletal system and connective tissue: Secondary | ICD-10-CM | POA: Insufficient documentation

## 2020-11-18 LAB — CBC WITH DIFFERENTIAL/PLATELET
Abs Immature Granulocytes: 0.73 10*3/uL — ABNORMAL HIGH (ref 0.00–0.07)
Basophils Absolute: 0.1 10*3/uL (ref 0.0–0.1)
Basophils Relative: 1 %
Eosinophils Absolute: 0.1 10*3/uL (ref 0.0–0.5)
Eosinophils Relative: 1 %
HCT: 35.1 % — ABNORMAL LOW (ref 39.0–52.0)
Hemoglobin: 11.7 g/dL — ABNORMAL LOW (ref 13.0–17.0)
Immature Granulocytes: 5 %
Lymphocytes Relative: 14 %
Lymphs Abs: 2.1 10*3/uL (ref 0.7–4.0)
MCH: 31 pg (ref 26.0–34.0)
MCHC: 33.3 g/dL (ref 30.0–36.0)
MCV: 93.1 fL (ref 80.0–100.0)
Monocytes Absolute: 1.6 10*3/uL — ABNORMAL HIGH (ref 0.1–1.0)
Monocytes Relative: 11 %
Neutro Abs: 10.4 10*3/uL — ABNORMAL HIGH (ref 1.7–7.7)
Neutrophils Relative %: 68 %
Platelets: 156 10*3/uL (ref 150–400)
RBC: 3.77 MIL/uL — ABNORMAL LOW (ref 4.22–5.81)
RDW: 15.5 % (ref 11.5–15.5)
Smear Review: NORMAL
WBC: 14.9 10*3/uL — ABNORMAL HIGH (ref 4.0–10.5)
nRBC: 0.4 % — ABNORMAL HIGH (ref 0.0–0.2)

## 2020-11-18 NOTE — Telephone Encounter (Signed)
Pt came by front desk wanting nurse to give him a call, he has a few questions.

## 2020-11-21 ENCOUNTER — Other Ambulatory Visit: Payer: Self-pay

## 2020-11-21 DIAGNOSIS — C8510 Unspecified B-cell lymphoma, unspecified site: Secondary | ICD-10-CM

## 2020-11-24 ENCOUNTER — Inpatient Hospital Stay: Payer: Self-pay

## 2020-11-24 DIAGNOSIS — C8510 Unspecified B-cell lymphoma, unspecified site: Secondary | ICD-10-CM

## 2020-11-24 LAB — CBC WITH DIFFERENTIAL/PLATELET
Abs Immature Granulocytes: 0.09 10*3/uL — ABNORMAL HIGH (ref 0.00–0.07)
Basophils Absolute: 0.1 10*3/uL (ref 0.0–0.1)
Basophils Relative: 1 %
Eosinophils Absolute: 0.1 10*3/uL (ref 0.0–0.5)
Eosinophils Relative: 1 %
HCT: 33 % — ABNORMAL LOW (ref 39.0–52.0)
Hemoglobin: 11 g/dL — ABNORMAL LOW (ref 13.0–17.0)
Immature Granulocytes: 1 %
Lymphocytes Relative: 19 %
Lymphs Abs: 1.6 10*3/uL (ref 0.7–4.0)
MCH: 31.1 pg (ref 26.0–34.0)
MCHC: 33.3 g/dL (ref 30.0–36.0)
MCV: 93.2 fL (ref 80.0–100.0)
Monocytes Absolute: 0.8 10*3/uL (ref 0.1–1.0)
Monocytes Relative: 10 %
Neutro Abs: 5.5 10*3/uL (ref 1.7–7.7)
Neutrophils Relative %: 68 %
Platelets: 312 10*3/uL (ref 150–400)
RBC: 3.54 MIL/uL — ABNORMAL LOW (ref 4.22–5.81)
RDW: 15.9 % — ABNORMAL HIGH (ref 11.5–15.5)
WBC: 8.1 10*3/uL (ref 4.0–10.5)
nRBC: 0.2 % (ref 0.0–0.2)

## 2020-11-24 LAB — COMPREHENSIVE METABOLIC PANEL
ALT: 18 U/L (ref 0–44)
AST: 21 U/L (ref 15–41)
Albumin: 3.8 g/dL (ref 3.5–5.0)
Alkaline Phosphatase: 78 U/L (ref 38–126)
Anion gap: 6 (ref 5–15)
BUN: 14 mg/dL (ref 8–23)
CO2: 28 mmol/L (ref 22–32)
Calcium: 9 mg/dL (ref 8.9–10.3)
Chloride: 106 mmol/L (ref 98–111)
Creatinine, Ser: 1.11 mg/dL (ref 0.61–1.24)
GFR, Estimated: >60 mL/min (ref >60)
Glucose, Bld: 115 mg/dL — ABNORMAL HIGH (ref 70–99)
Potassium: 3.7 mmol/L (ref 3.5–5.1)
Sodium: 140 mmol/L (ref 135–145)
Total Bilirubin: 0.4 mg/dL (ref 0.3–1.2)
Total Protein: 6.4 g/dL — ABNORMAL LOW (ref 6.5–8.1)

## 2020-12-01 ENCOUNTER — Inpatient Hospital Stay (HOSPITAL_BASED_OUTPATIENT_CLINIC_OR_DEPARTMENT_OTHER): Payer: Self-pay | Admitting: Oncology

## 2020-12-01 ENCOUNTER — Inpatient Hospital Stay: Payer: Medicaid Other

## 2020-12-01 ENCOUNTER — Inpatient Hospital Stay: Payer: Self-pay

## 2020-12-01 ENCOUNTER — Encounter: Payer: Self-pay | Admitting: Oncology

## 2020-12-01 ENCOUNTER — Other Ambulatory Visit: Payer: Self-pay | Admitting: *Deleted

## 2020-12-01 VITALS — BP 125/76 | HR 65 | Temp 97.8°F | Resp 16 | Ht 73.0 in | Wt 182.0 lb

## 2020-12-01 DIAGNOSIS — C8510 Unspecified B-cell lymphoma, unspecified site: Secondary | ICD-10-CM

## 2020-12-01 DIAGNOSIS — Z79899 Other long term (current) drug therapy: Secondary | ICD-10-CM

## 2020-12-01 DIAGNOSIS — Z79620 Long term (current) use of immunosuppressive biologic: Secondary | ICD-10-CM

## 2020-12-01 DIAGNOSIS — Z5181 Encounter for therapeutic drug level monitoring: Secondary | ICD-10-CM

## 2020-12-01 DIAGNOSIS — R591 Generalized enlarged lymph nodes: Secondary | ICD-10-CM

## 2020-12-01 DIAGNOSIS — C8591 Non-Hodgkin lymphoma, unspecified, lymph nodes of head, face, and neck: Secondary | ICD-10-CM

## 2020-12-01 DIAGNOSIS — Z5111 Encounter for antineoplastic chemotherapy: Secondary | ICD-10-CM

## 2020-12-01 LAB — CBC WITH DIFFERENTIAL/PLATELET
Abs Immature Granulocytes: 0.02 10*3/uL (ref 0.00–0.07)
Basophils Absolute: 0 10*3/uL (ref 0.0–0.1)
Basophils Relative: 1 %
Eosinophils Absolute: 0.1 10*3/uL (ref 0.0–0.5)
Eosinophils Relative: 2 %
HCT: 36.2 % — ABNORMAL LOW (ref 39.0–52.0)
Hemoglobin: 12 g/dL — ABNORMAL LOW (ref 13.0–17.0)
Immature Granulocytes: 1 %
Lymphocytes Relative: 37 %
Lymphs Abs: 1.5 10*3/uL (ref 0.7–4.0)
MCH: 31.3 pg (ref 26.0–34.0)
MCHC: 33.1 g/dL (ref 30.0–36.0)
MCV: 94.3 fL (ref 80.0–100.0)
Monocytes Absolute: 0.7 10*3/uL (ref 0.1–1.0)
Monocytes Relative: 16 %
Neutro Abs: 1.7 10*3/uL (ref 1.7–7.7)
Neutrophils Relative %: 43 %
Platelets: 289 10*3/uL (ref 150–400)
RBC: 3.84 MIL/uL — ABNORMAL LOW (ref 4.22–5.81)
RDW: 16.3 % — ABNORMAL HIGH (ref 11.5–15.5)
WBC: 4 10*3/uL (ref 4.0–10.5)
nRBC: 0 % (ref 0.0–0.2)

## 2020-12-01 LAB — COMPREHENSIVE METABOLIC PANEL
ALT: 22 U/L (ref 0–44)
AST: 30 U/L (ref 15–41)
Albumin: 4.1 g/dL (ref 3.5–5.0)
Alkaline Phosphatase: 76 U/L (ref 38–126)
Anion gap: 7 (ref 5–15)
BUN: 12 mg/dL (ref 8–23)
CO2: 26 mmol/L (ref 22–32)
Calcium: 9.2 mg/dL (ref 8.9–10.3)
Chloride: 106 mmol/L (ref 98–111)
Creatinine, Ser: 0.94 mg/dL (ref 0.61–1.24)
GFR, Estimated: >60 mL/min (ref >60)
Glucose, Bld: 138 mg/dL — ABNORMAL HIGH (ref 70–99)
Potassium: 3.6 mmol/L (ref 3.5–5.1)
Sodium: 139 mmol/L (ref 135–145)
Total Bilirubin: 0.5 mg/dL (ref 0.3–1.2)
Total Protein: 6.8 g/dL (ref 6.5–8.1)

## 2020-12-01 MED ORDER — HEPARIN SOD (PORK) LOCK FLUSH 100 UNIT/ML IV SOLN
500.0000 [IU] | Freq: Once | INTRAVENOUS | Status: DC | PRN
  Filled 2020-12-01: qty 5

## 2020-12-01 MED ORDER — SODIUM CHLORIDE 0.9 % IV SOLN
Freq: Once | INTRAVENOUS | Status: AC
  Administered 2020-12-01: 8 mg via INTRAVENOUS
  Filled 2020-12-01: qty 4

## 2020-12-01 MED ORDER — ACETAMINOPHEN 325 MG PO TABS
650.0000 mg | ORAL_TABLET | Freq: Once | ORAL | Status: AC
  Administered 2020-12-01: 650 mg via ORAL
  Filled 2020-12-01: qty 2

## 2020-12-01 MED ORDER — SODIUM CHLORIDE 0.9% FLUSH
10.0000 mL | INTRAVENOUS | Status: DC | PRN
  Filled 2020-12-01: qty 10

## 2020-12-01 MED ORDER — DIPHENHYDRAMINE HCL 25 MG PO CAPS
50.0000 mg | ORAL_CAPSULE | Freq: Once | ORAL | Status: AC
  Administered 2020-12-01: 50 mg via ORAL
  Filled 2020-12-01: qty 2

## 2020-12-01 MED ORDER — SODIUM CHLORIDE 0.9 % IV SOLN
INTRAVENOUS | Status: DC
  Filled 2020-12-01: qty 250

## 2020-12-01 MED ORDER — SODIUM CHLORIDE 0.9 % IV SOLN
375.0000 mg/m2 | Freq: Once | INTRAVENOUS | Status: AC
  Administered 2020-12-01: 800 mg via INTRAVENOUS
  Filled 2020-12-01: qty 50

## 2020-12-01 MED ORDER — PREDNISONE 50 MG PO TABS: ORAL_TABLET | ORAL | 0 refills | Status: DC

## 2020-12-01 MED ORDER — VINCRISTINE SULFATE CHEMO INJECTION 1 MG/ML
Freq: Once | INTRAVENOUS | Status: AC
  Filled 2020-12-01: qty 10

## 2020-12-01 NOTE — Progress Notes (Signed)
This time he had sore jaw, voice change which is not a new thing, lost taste bud but it came back

## 2020-12-01 NOTE — Patient Instructions (Signed)
Doxorubicin injection What is this medication? DOXORUBICIN (dox oh ROO bi sin) is a chemotherapy drug. It is used to treat many kinds of cancer like leukemia, lymphoma, neuroblastoma, sarcoma, and Wilms' tumor. It is also used to treat bladder cancer, breast cancer, lung cancer, ovarian cancer, stomach cancer, and thyroid cancer. This medicine may be used for other purposes; ask your health care provider or pharmacist if you have questions. COMMON BRAND NAME(S): Adriamycin, Adriamycin PFS, Adriamycin RDF, Rubex What should I tell my care team before I take this medication? They need to know if you have any of these conditions: heart disease history of low blood counts caused by a medicine liver disease recent or ongoing radiation therapy an unusual or allergic reaction to doxorubicin, other chemotherapy agents, other medicines, foods, dyes, or preservatives pregnant or trying to get pregnant breast-feeding How should I use this medication? This drug is given as an infusion into a vein. It is administered in a hospital or clinic by a specially trained health care professional. If you have pain, swelling, burning or any unusual feeling around the site of your injection, tell your health care professional right away. Talk to your pediatrician regarding the use of this medicine in children. Special care may be needed. Overdosage: If you think you have taken too much of this medicine contact a poison control center or emergency room at once. NOTE: This medicine is only for you. Do not share this medicine with others. What if I miss a dose? It is important not to miss your dose. Call your doctor or health care professional if you are unable to keep an appointment. What may interact with this medication? This medicine may interact with the following medications: 6-mercaptopurine paclitaxel phenytoin St. John's Wort trastuzumab verapamil This list may not describe all possible interactions. Give  your health care provider a list of all the medicines, herbs, non-prescription drugs, or dietary supplements you use. Also tell them if you smoke, drink alcohol, or use illegal drugs. Some items may interact with your medicine. What should I watch for while using this medication? This drug may make you feel generally unwell. This is not uncommon, as chemotherapy can affect healthy cells as well as cancer cells. Report any side effects. Continue your course of treatment even though you feel ill unless your doctor tells you to stop. There is a maximum amount of this medicine you should receive throughout your life. The amount depends on the medical condition being treated and your overall health. Your doctor will watch how much of this medicine you receive in your lifetime. Tell your doctor if you have taken this medicine before. You may need blood work done while you are taking this medicine. Your urine may turn red for a few days after your dose. This is not blood. If your urine is dark or brown, call your doctor. In some cases, you may be given additional medicines to help with side effects. Follow all directions for their use. Call your doctor or health care professional for advice if you get a fever, chills or sore throat, or other symptoms of a cold or flu. Do not treat yourself. This drug decreases your body's ability to fight infections. Try to avoid being around people who are sick. This medicine may increase your risk to bruise or bleed. Call your doctor or health care professional if you notice any unusual bleeding. Talk to your doctor about your risk of cancer. You may be more at risk for certain types  of cancers if you take this medicine. Do not become pregnant while taking this medicine or for 6 months after stopping it. Women should inform their doctor if they wish to become pregnant or think they might be pregnant. Men should not father a child while taking this medicine and for 6 months  after stopping it. There is a potential for serious side effects to an unborn child. Talk to your health care professional or pharmacist for more information. Do not breast-feed an infant while taking this medicine. This medicine has caused ovarian failure in some women and reduced sperm counts in some men This medicine may interfere with the ability to have a child. Talk with your doctor or health care professional if you are concerned about your fertility. This medicine may cause a decrease in Co-Enzyme Q-10. You should make sure that you get enough Co-Enzyme Q-10 while you are taking this medicine. Discuss the foods you eat and the vitamins you take with your health care professional. What side effects may I notice from receiving this medication? Side effects that you should report to your doctor or health care professional as soon as possible: allergic reactions like skin rash, itching or hives, swelling of the face, lips, or tongue breathing problems chest pain fast or irregular heartbeat low blood counts - this medicine may decrease the number of white blood cells, red blood cells and platelets. You may be at increased risk for infections and bleeding. pain, redness, or irritation at site where injected signs of infection - fever or chills, cough, sore throat, pain or difficulty passing urine signs of decreased platelets or bleeding - bruising, pinpoint red spots on the skin, black, tarry stools, blood in the urine swelling of the ankles, feet, hands tiredness weakness Side effects that usually do not require medical attention (report to your doctor or health care professional if they continue or are bothersome): diarrhea hair loss mouth sores nail discoloration or damage nausea red colored urine vomiting This list may not describe all possible side effects. Call your doctor for medical advice about side effects. You may report side effects to FDA at 1-800-FDA-1088. Where should I keep  my medication? This drug is given in a hospital or clinic and will not be stored at home. NOTE: This sheet is a summary. It may not cover all possible information. If you have questions about this medicine, talk to your doctor, pharmacist, or health care provider.  2022 Elsevier/Gold Standard (2016-10-13 11:01:26) Etoposide, VP-16 injection What is this medication? ETOPOSIDE, VP-16 (e toe POE side) is a chemotherapy drug. It is used to treat testicular cancer, lung cancer, and other cancers. This medicine may be used for other purposes; ask your health care provider or pharmacist if you have questions. COMMON BRAND NAME(S): Etopophos, Toposar, VePesid What should I tell my care team before I take this medication? They need to know if you have any of these conditions: infection kidney disease liver disease low blood counts, like low white cell, platelet, or red cell counts an unusual or allergic reaction to etoposide, other medicines, foods, dyes, or preservatives pregnant or trying to get pregnant breast-feeding How should I use this medication? This medicine is for infusion into a vein. It is administered in a hospital or clinic by a specially trained health care professional. Talk to your pediatrician regarding the use of this medicine in children. Special care may be needed. Overdosage: If you think you have taken too much of this medicine contact a poison control  center or emergency room at once. NOTE: This medicine is only for you. Do not share this medicine with others. What if I miss a dose? It is important not to miss your dose. Call your doctor or health care professional if you are unable to keep an appointment. What may interact with this medication? This medicine may interact with the following medications: warfarin This list may not describe all possible interactions. Give your health care provider a list of all the medicines, herbs, non-prescription drugs, or dietary  supplements you use. Also tell them if you smoke, drink alcohol, or use illegal drugs. Some items may interact with your medicine. What should I watch for while using this medication? Visit your doctor for checks on your progress. This drug may make you feel generally unwell. This is not uncommon, as chemotherapy can affect healthy cells as well as cancer cells. Report any side effects. Continue your course of treatment even though you feel ill unless your doctor tells you to stop. In some cases, you may be given additional medicines to help with side effects. Follow all directions for their use. Call your doctor or health care professional for advice if you get a fever, chills or sore throat, or other symptoms of a cold or flu. Do not treat yourself. This drug decreases your body's ability to fight infections. Try to avoid being around people who are sick. This medicine may increase your risk to bruise or bleed. Call your doctor or health care professional if you notice any unusual bleeding. Talk to your doctor about your risk of cancer. You may be more at risk for certain types of cancers if you take this medicine. Do not become pregnant while taking this medicine or for at least 6 months after stopping it. Women should inform their doctor if they wish to become pregnant or think they might be pregnant. Women of child-bearing potential will need to have a negative pregnancy test before starting this medicine. There is a potential for serious side effects to an unborn child. Talk to your health care professional or pharmacist for more information. Do not breast-feed an infant while taking this medicine. Men must use a latex condom during sexual contact with a woman while taking this medicine and for at least 4 months after stopping it. A latex condom is needed even if you have had a vasectomy. Contact your doctor right away if your partner becomes pregnant. Do not donate sperm while taking this medicine and  for at least 4 months after you stop taking this medicine. Men should inform their doctors if they wish to father a child. This medicine may lower sperm counts. What side effects may I notice from receiving this medication? Side effects that you should report to your doctor or health care professional as soon as possible: allergic reactions like skin rash, itching or hives, swelling of the face, lips, or tongue low blood counts - this medicine may decrease the number of white blood cells, red blood cells, and platelets. You may be at increased risk for infections and bleeding nausea, vomiting redness, blistering, peeling or loosening of the skin, including inside the mouth signs and symptoms of infection like fever; chills; cough; sore throat; pain or trouble passing urine signs and symptoms of low red blood cells or anemia such as unusually weak or tired; feeling faint or lightheaded; falls; breathing problems unusual bruising or bleeding Side effects that usually do not require medical attention (report to your doctor or health care  professional if they continue or are bothersome): changes in taste diarrhea hair loss loss of appetite mouth sores This list may not describe all possible side effects. Call your doctor for medical advice about side effects. You may report side effects to FDA at 1-800-FDA-1088. Where should I keep my medication? This drug is given in a hospital or clinic and will not be stored at home. NOTE: This sheet is a summary. It may not cover all possible information. If you have questions about this medicine, talk to your doctor, pharmacist, or health care provider.  2022 Elsevier/Gold Standard (2018-04-26 16:57:15) Rituximab Injection What is this medication? RITUXIMAB (ri TUX i mab) is a monoclonal antibody. It is used to treat certain types of cancer like non-Hodgkin lymphoma and chronic lymphocytic leukemia. It is also used to treat rheumatoid arthritis,  granulomatosis with polyangiitis, microscopic polyangiitis, and pemphigus vulgaris. This medicine may be used for other purposes; ask your health care provider or pharmacist if you have questions. COMMON BRAND NAME(S): RIABNI, Rituxan, RUXIENCE What should I tell my care team before I take this medication? They need to know if you have any of these conditions: chest pain heart disease infection especially a viral infection such as chickenpox, cold sores, hepatitis B, or herpes immune system problems irregular heartbeat or rhythm kidney disease low blood counts (white cells, platelets, or red cells) lung disease recent or upcoming vaccine an unusual or allergic reaction to rituximab, other medicines, foods, dyes, or preservatives pregnant or trying to get pregnant breast-feeding How should I use this medication? This medicine is injected into a vein. It is given by a health care provider in a hospital or clinic setting. A special MedGuide will be given to you before each treatment. Be sure to read this information carefully each time. Talk to your health care provider about the use of this medicine in children. While this drug may be prescribed for children as young as 6 months for selected conditions, precautions do apply. Overdosage: If you think you have taken too much of this medicine contact a poison control center or emergency room at once. NOTE: This medicine is only for you. Do not share this medicine with others. What if I miss a dose? Keep appointments for follow-up doses. It is important not to miss your dose. Call your health care provider if you are unable to keep an appointment. What may interact with this medication? Do not take this medicine with any of the following medicines: live vaccines This medicine may also interact with the following medicines: cisplatin This list may not describe all possible interactions. Give your health care provider a list of all the  medicines, herbs, non-prescription drugs, or dietary supplements you use. Also tell them if you smoke, drink alcohol, or use illegal drugs. Some items may interact with your medicine. What should I watch for while using this medication? Your condition will be monitored carefully while you are receiving this medicine. You may need blood work done while you are taking this medicine. This medicine can cause serious infusion reactions. To reduce the risk your health care provider may give you other medicines to take before receiving this one. Be sure to follow the directions from your health care provider. This medicine may increase your risk of getting an infection. Call your health care provider for advice if you get a fever, chills, sore throat, or other symptoms of a cold or flu. Do not treat yourself. Try to avoid being around people who are sick.  Call your health care provider if you are around anyone with measles, chickenpox, or if you develop sores or blisters that do not heal properly. Avoid taking medicines that contain aspirin, acetaminophen, ibuprofen, naproxen, or ketoprofen unless instructed by your health care provider. These medicines may hide a fever. This medicine may cause serious skin reactions. They can happen weeks to months after starting the medicine. Contact your health care provider right away if you notice fevers or flu-like symptoms with a rash. The rash may be red or purple and then turn into blisters or peeling of the skin. Or, you might notice a red rash with swelling of the face, lips or lymph nodes in your neck or under your arms. In some patients, this medicine may cause a serious brain infection that may cause death. If you have any problems seeing, thinking, speaking, walking, or standing, tell your healthcare professional right away. If you cannot reach your healthcare professional, urgently seek other source of medical care. Do not become pregnant while taking this medicine  or for at least 12 months after stopping it. Women should inform their health care provider if they wish to become pregnant or think they might be pregnant. There is potential for serious harm to an unborn child. Talk to your health care provider for more information. Women should use a reliable form of birth control while taking this medicine and for 12 months after stopping it. Do not breast-feed while taking this medicine or for at least 6 months after stopping it. What side effects may I notice from receiving this medication? Side effects that you should report to your health care provider as soon as possible: allergic reactions (skin rash, itching or hives; swelling of the face, lips, or tongue) diarrhea edema (sudden weight gain; swelling of the ankles, feet, hands or other unusual swelling; trouble breathing) fast, irregular heartbeat heart attack (trouble breathing; pain or tightness in the chest, neck, back or arms; unusually weak or tired) infection (fever, chills, cough, sore throat, pain or trouble passing urine) kidney injury (trouble passing urine or change in the amount of urine) liver injury (dark yellow or brown urine; general ill feeling or flu-like symptoms; loss of appetite, right upper belly pain; unusually weak or tired, yellowing of the eyes or skin) low blood pressure (dizziness; feeling faint or lightheaded, falls; unusually weak or tired) low red blood cell counts (trouble breathing; feeling faint; lightheaded, falls; unusually weak or tired) mouth sores redness, blistering, peeling, or loosening of the skin, including inside the mouth stomach pain unusual bruising or bleeding wheezing (trouble breathing with loud or whistling sounds) vomiting Side effects that usually do not require medical attention (report to your health care provider if they continue or are bothersome): headache joint pain muscle cramps, pain nausea This list may not describe all possible side  effects. Call your doctor for medical advice about side effects. You may report side effects to FDA at 1-800-FDA-1088. Where should I keep my medication? This medicine is given in a hospital or clinic. It will not be stored at home. NOTE: This sheet is a summary. It may not cover all possible information. If you have questions about this medicine, talk to your doctor, pharmacist, or health care provider.  2022 Elsevier/Gold Standard (2020-02-21 15:47:26) [facility]  Discharge Instructions: Thank you for choosing [facility] to provide your oncology and hematology care.  If you have a lab appointment with the [facility], please go directly to  the [facility] and check in at the registration area.  Wear comfortable clothing and clothing appropriate for easy access to any Portacath or PICC line.   We strive to give you quality time with your provider. You may need to reschedule your appointment if you arrive late (15 or more minutes).  Arriving late affects you and other patients whose appointments are after yours.  Also, if you miss three or more appointments without notifying the office, you may be dismissed from the clinic at the provider's discretion.      For prescription refill requests, have your pharmacy contact our office and allow 72 hours for refills to be completed.    Today you received the following chemotherapy and/or immunotherapy agents R-EPOCH: The chemotherapy medication bag should finish at 46 hours, 96 hours, or 7 days. For example, if your pump is scheduled for 46 hours and it was put on at 4:00 p.m., it should finish at 2:00 p.m. the day it is scheduled to come off regardless of your appointment time.     Estimated time to finish at 10:30am.   If the display on your pump reads "Low Volume" and it is beeping, take the batteries out of the pump and come to the cancer center for it to be taken off.   If the pump alarms go  off prior to the pump reading "Low Volume" then call [phone] and someone can assist you.  If the plunger comes out and the chemotherapy medication is leaking out, please use your home chemo spill kit to clean up the spill. Do NOT use paper towels or other household products.  If you have problems or questions regarding your pump, please call either [phone] (24 hours a day) or the cancer center Monday-Friday 8:00 a.m.- 4:30 p.m. at the clinic number and we will assist you. If you are unable to get assistance, then go to the nearest Emergency Department and ask the staff to contact the IV team for assistance.        To help prevent nausea and vomiting after your treatment, we encourage you to take your nausea medication as directed.  BELOW ARE SYMPTOMS THAT SHOULD BE REPORTED IMMEDIATELY: *FEVER GREATER THAN 100.4 F (38 C) OR HIGHER *CHILLS OR SWEATING *NAUSEA AND VOMITING THAT IS NOT CONTROLLED WITH YOUR NAUSEA MEDICATION *UNUSUAL SHORTNESS OF BREATH *UNUSUAL BRUISING OR BLEEDING *URINARY PROBLEMS (pain or burning when urinating, or frequent urination) *BOWEL PROBLEMS (unusual diarrhea, constipation, pain near the anus) TENDERNESS IN MOUTH AND THROAT WITH OR WITHOUT PRESENCE OF ULCERS (sore throat, sores in mouth, or a toothache) UNUSUAL RASH, SWELLING OR PAIN  UNUSUAL VAGINAL DISCHARGE OR ITCHING   Items with * indicate a potential emergency and should be followed up as soon as possible or go to the Emergency Department if any problems should occur.  Please show the CHEMOTHERAPY ALERT CARD or IMMUNOTHERAPY ALERT CARD at check-in to the Emergency Department and triage nurse.  Should you have questions after your visit or need to cancel or reschedule your appointment, please contact [facility]  [phone] and follow the prompts.  Office hours are 8:00 a.m. to 4:30 p.m. Monday - Friday. Please note that voicemails left after 4:00 p.m. may  not be returned until the following business day.  We are closed weekends and major holidays. You have access to a nurse at all times for urgent questions. Please call the main number to the clinic [phone] and follow the prompts.  For  any non-urgent questions, you may also contact your provider using MyChart. We now offer e-Visits for anyone 56 and older to request care online for non-urgent symptoms. For details visit [email or web address].   Also download the MyChart app! Go to the app store, search "MyChart", open the app, select [facility], and log in with your MyChart username and password.  Due to Covid, a mask is required upon entering the hospital/clinic. If you do not have a mask, one will be given to you upon arrival. For doctor visits, patients may have 1 support person aged 31 or older with them. For treatment visits, patients cannot have anyone with them due to current Covid guidelines and our immunocompromised population. Vincristine injection What is this medication? VINCRISTINE (vin KRIS teen) is a chemotherapy drug. It slows the growth of cancer cells. This medicine is used to treat many types of cancer like Hodgkin's disease, leukemia, non-Hodgkin's lymphoma, neuroblastoma (brain cancer), rhabdomyosarcoma, and Wilms' tumor. This medicine may be used for other purposes; ask your health care provider or pharmacist if you have questions. COMMON BRAND NAME(S): Oncovin, Vincasar PFS What should I tell my care team before I take this medication? They need to know if you have any of these conditions: blood disorders gout infection (especially chickenpox, cold sores, or herpes) kidney disease liver disease lung disease nervous system disease like Charcot-Marie-Tooth (CMT) recent or ongoing radiation therapy an unusual or allergic reaction to vincristine, other chemotherapy agents, other medicines, foods, dyes, or preservatives pregnant or trying to get  pregnant breast-feeding How should I use this medication? This drug is given as an infusion into a vein. It is administered in a hospital or clinic by a specially trained health care professional. If you have pain, swelling, burning, or any unusual feeling around the site of your injection, tell your health care professional right away. Talk to your pediatrician regarding the use of this medicine in children. While this drug may be prescribed for selected conditions, precautions do apply. Overdosage: If you think you have taken too much of this medicine contact a poison control center or emergency room at once. NOTE: This medicine is only for you. Do not share this medicine with others. What if I miss a dose? It is important not to miss your dose. Call your doctor or health care professional if you are unable to keep an appointment. What may interact with this medication? certain medicines for fungal infections like itraconazole, ketoconazole, posaconazole, voriconazole certain medicines for seizures like phenytoin This list may not describe all possible interactions. Give your health care provider a list of all the medicines, herbs, non-prescription drugs, or dietary supplements you use. Also tell them if you smoke, drink alcohol, or use illegal drugs. Some items may interact with your medicine. What should I watch for while using this medication? This drug may make you feel generally unwell. This is not uncommon, as chemotherapy can affect healthy cells as well as cancer cells. Report any side effects. Continue your course of treatment even though you feel ill unless your doctor tells you to stop. You may need blood work done while you are taking this medicine. This medicine will cause constipation. Try to have a bowel movement at least every 2 to 3 days. If you do not have a bowel movement for 3 days, call your doctor or health care professional. In some cases, you may be given additional  medicines to help with side effects. Follow all directions for their use. Do not become pregnant  while taking this medicine. Women should inform their doctor if they wish to become pregnant or think they might be pregnant. There is a potential for serious side effects to an unborn child. Talk to your health care professional or pharmacist for more information. Do not breast-feed an infant while taking this medicine. This medicine may make it more difficult to get pregnant or to father a child. Talk to your healthcare professional if you are concerned about your fertility. What side effects may I notice from receiving this medication? Side effects that you should report to your doctor or health care professional as soon as possible: allergic reactions like skin rash, itching or hives, swelling of the face, lips, or tongue breathing problems confusion or changes in emotions or moods constipation cough mouth sores muscle weakness nausea and vomiting pain, swelling, redness or irritation at the injection site pain, tingling, numbness in the hands or feet problems with balance, talking, walking seizures stomach pain trouble passing urine or change in the amount of urine Side effects that usually do not require medical attention (report to your doctor or health care professional if they continue or are bothersome): diarrhea hair loss jaw pain loss of appetite This list may not describe all possible side effects. Call your doctor for medical advice about side effects. You may report side effects to FDA at 1-800-FDA-1088. Where should I keep my medication? This drug is given in a hospital or clinic and will not be stored at home. NOTE: This sheet is a summary. It may not cover all possible information. If you have questions about this medicine, talk to your doctor, pharmacist, or health care provider.  2022 Elsevier/Gold Standard (2019-01-30 17:05:13)

## 2020-12-01 NOTE — Progress Notes (Signed)
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  This was followed by a PET CT scan which showed extensive FDG avid adenopathy in the neck chest abdomen and pelvis.  FDG avid splenic lesions.  Solitary intramuscular FDG avid lesion in the right biceps femoris muscle.   Supraclavicular excisional lymph node biopsy showed high-grade B-cell lymphoma germinal center type Ki-67 greater than 95%.  FISH testing was positive for Bcl-2 BCL6 and MYC consistent with triple hit lymphoma.  By NCCN IPI score would be 4 based on age and elevated LDH and stage IV (intramuscular biceps femoris lesion) which puts him in the high intermediate risk group. 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   Patient received RCHOP for cycle 1 with plans for [facility] with cycle 2. IT MTX for CNS prophylaxis and he has received 3 cycles so far.   MRI brain negative for lymphoma.  Chemotherapy was not being able to escalated upwards due to neutropenia which has persisted to 3 weeks and he has been receiving treatment every 4 weeks   PET CT scan after 3 cycles of chemotherapy showedComplete or near complete metabolic  response.  Residual disease in the anterior upper right mediastinum slightly greater than background mediastinal activity  Interval history-patient reports doing very well presently.  Denies any complaints at this time.  No baseline neuropathy.  Appetite and weight have remained normal.  ECOG PS- 0 Pain scale- 0  Review of systems- Review of Systems  Constitutional:  Negative for chills, fever, malaise/fatigue and weight loss.  HENT:  Negative for congestion, ear discharge and nosebleeds.   Eyes:  Negative for blurred vision.  Respiratory:  Negative for cough, hemoptysis, sputum production, shortness of breath and wheezing.   Cardiovascular:  Negative for chest pain, palpitations, orthopnea and claudication.  Gastrointestinal:  Negative for abdominal pain, blood in stool, constipation, diarrhea, heartburn, melena, nausea and vomiting.  Genitourinary:  Negative for dysuria, flank pain, frequency, hematuria and urgency.  Musculoskeletal:  Negative for back pain, joint pain and myalgias.  Skin:  Negative for rash.  Neurological:  Negative for dizziness, tingling, focal weakness, seizures, weakness and headaches.  Endo/Heme/Allergies:  Does not bruise/bleed easily.  Psychiatric/Behavioral:  Negative for depression and suicidal ideas. The patient does not have insomnia.      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     Past Surgical History:  Procedure Laterality Date   BONE MARROW BIOPSY     COLONOSCOPY     EXCISION MASS NECK Right 06/12/2020   Procedure: EXCISION MASS NECK;  Surgeon: Caroleen Hamman  F, MD;  Location: [facility] ORS;  Service: General;  Laterality: Right;   HERNIA REPAIR Right    at age 46-RIH   86 [place] Right 06/24/2020   Procedure: INSERTION PORT-A-CATH;  Surgeon: Jules Husbands, MD;  Location: [facility] ORS;   Service: General;  Laterality: Right;    Social History   Socioeconomic History   Marital status: Single    Spouse name: Not on file   Number of children: Not on file   Years of education: Not on file   Highest education level: Not on file  Occupational History   Not on file  Tobacco Use   Smoking status: Former    Types: Cigarettes    Quit date: 06/13/2020    Years since quitting: 0.4   Smokeless tobacco: Never  Vaping Use   Vaping Use: Never used  Substance and Sexual Activity   Alcohol use: Not Currently   Drug use: Not Currently    Types: Marijuana   Sexual activity: Not Currently  Other Topics Concern   Not on file  Social History Narrative   Has daughter and grandson in the home   Social Determinants of Health   Financial Resource Strain: Not on file  Food Insecurity: Not on file  Transportation Needs: Not on file  Physical Activity: Not on file  Stress: Not on file  Social Connections: Not on file  Intimate Partner Violence: Not on file    Family History  Problem Relation Age of Onset   Anemia Mother    Hypertension Mother    Goiter Mother    Cancer Father    Cancer Sister    Multiple sclerosis Sister      Current Outpatient Medications:    allopurinol (ZYLOPRIM) 300 MG tablet, Take 1 tablet (300 mg total) by mouth daily., Disp: 30 tablet, Rfl: 1   atenolol (TENORMIN) 25 MG tablet, Take 25 mg by mouth daily., Disp: , Rfl:    lisinopril (ZESTRIL) 10 MG tablet, Take 10 mg by mouth daily., Disp: , Rfl:    predniSONE (DELTASONE) 50 MG tablet, Take 2 tablets each day with food early in am for 5 days while on chemo treatment, Disp: 10 tablet, Rfl: 0   VITAMIN D PO, Take 1 capsule by mouth daily., Disp: , Rfl:    HYDROcodone-acetaminophen (NORCO/VICODIN) 5-325 MG tablet, Take 1 tablet by mouth every 6 (six) hours as needed for moderate pain. (Patient not taking: No sig reported), Disp: 15 tablet, Rfl: 0 No current facility-administered medications for this  visit.  Facility-Administered Medications Ordered in Other Visits:    0.9 %  sodium chloride infusion, , Intravenous, Continuous, Sindy Guadeloupe, MD, Stopped at 08/12/20 1310   0.9 %  sodium chloride infusion, , Intravenous, Continuous, Sindy Guadeloupe, MD, Stopped at 09/08/20 0944   0.9 %  sodium chloride infusion, , Intravenous, Continuous, Sindy Guadeloupe, MD, Stopped at 09/09/20 1507   0.9 %  sodium chloride infusion, , Intravenous, Continuous, Rogue Bussing, Elisha Headland, MD, Stopped at 09/11/20 1502   0.9 %  sodium chloride infusion, , Intravenous, Continuous, Sindy Guadeloupe, MD, Stopped at 10/06/20 1330   0.9 %  sodium chloride infusion, , Intravenous, Continuous, Sindy Guadeloupe, MD, Stopped at 11/03/20 1225   0.9 %  sodium chloride infusion, , Intravenous, Continuous, Sindy Guadeloupe, MD, Stopped at 11/06/20 1424   heparin lock flush 100 unit/mL, 500 Units, Intravenous, Once, Sindy Guadeloupe, MD   methotrexate (  PF) 12 mg in sodium chloride (PF) 0.9 % INTRATHECAL chemo injection, , Intrathecal, Once, Sindy Guadeloupe, MD   sodium chloride flush (NS) 0.9 % injection 10 mL, 10 mL, Intravenous, PRN, Sindy Guadeloupe, MD, 10 mL at 08/13/20 1510   sodium chloride flush (NS) 0.9 % injection 10 mL, 10 mL, Intravenous, PRN, Sindy Guadeloupe, MD, 10 mL at 11/04/20 1408  Physical exam:  Vitals:   12/01/20 0843  BP: 125/76  Pulse: 65  Resp: 16  Temp: 97.8 F (36.6 C)  TempSrc: Oral  Weight: 182 lb (82.6 kg)  Height: 6' 1" (1.854 m)   Physical Exam Constitutional:      General: He is not in acute distress. Cardiovascular:     Rate and Rhythm: Normal rate and regular rhythm.     Heart sounds: Normal heart sounds.  Pulmonary:     Effort: Pulmonary effort is normal.     Breath sounds: Normal breath sounds.  Abdominal:     General: Bowel sounds are normal.     Palpations: Abdomen is soft.  Lymphadenopathy:     Comments: No palpable cervical, supraclavicular, axillary or inguinal adenopathy     Skin:    General: Skin is warm and dry.  Neurological:     Mental Status: He is alert and oriented to person, place, and time.     CMP Latest Ref Rng & Units 12/01/2020  Glucose 70 - 99 mg/dL 138(H)  BUN 8 - 23 mg/dL 12  Creatinine 0.61 - 1.24 mg/dL 0.94  Sodium 135 - 145 mmol/L 139  Potassium 3.5 - 5.1 mmol/L 3.6  Chloride 98 - 111 mmol/L 106  CO2 22 - 32 mmol/L 26  Calcium 8.9 - 10.3 mg/dL 9.2  Total Protein 6.5 - 8.1 g/dL 6.8  Total Bilirubin 0.3 - 1.2 mg/dL 0.5  Alkaline Phos 38 - 126 U/L 76  AST 15 - 41 U/L 30  ALT 0 - 44 U/L 22   CBC Latest Ref Rng & Units 12/01/2020  WBC 4.0 - 10.5 K/uL 4.0  Hemoglobin 13.0 - 17.0 g/dL 12.0(L)  Hematocrit 39.0 - 52.0 % 36.2(L)  Platelets 150 - 400 K/uL 289      Assessment and plan- Patient is a 60 y.o. male with stage IV triple hit high-grade B-cell lymphoma.  He is here for on treatment assessment prior to cycle 5 of R-EPOCH chemotherapy  Patient received R-CHOP chemotherapy for cycle 1 and therefore he has received 5 cycles of chemotherapy so far.  This will be cycle 6 of R-EPOCH chemotherapy.  After cycle 1 he had neutropenic fever and we were also not able to give him chemotherapy every 3 weeks due to an [place] of 1 at the end of 3 weeks and he has been receiving chemotherapy every 4 weeks.  He did have an interim PET scan after 3 cycles of chemotherapyWhich showed complete or near complete metabolic response and only residual disease noted in the right anterior right mediastinum [place] 3 and other areas were [place] 1.  I plan to get a repeat PET CT scan after this cycle and see him thereafter.  Patient also had a high risk CNS IPI score and has received only 3 cycles of intrathecal methotrexate so far.  He reported significant side effects from it and decided not to take itAfter cycle 5.  We discussed it is important for him at least to get 1 more intrathecal methotrexate after his last cycle of chemotherapy and we can give it  along  with premedications and hydrocortisone intrathecal as well.  Patient would like to think about it and get back to [place].  Overall he is feeling very well and has responded well to treatment without any significant side effects.  Patient is on tenofovir prophylaxis to prevent hepatitis reactivation he states he is taking the medication but and not seen on his medication list and we will call him again today to confirm that he is taking or else we will renew the prescription.  Patient will also continue allopurinol for now   Visit Diagnosis 1. Lymphoma of lymph nodes of neck, unspecified lymphoma type ([place])   2. Lymphadenopathy      Dr. Randa Evens, MD, MPH [facility] at [facility] [place] [phone] 12/01/2020 9:18 AM

## 2020-12-02 ENCOUNTER — Inpatient Hospital Stay: Payer: Self-pay

## 2020-12-02 VITALS — BP 107/67 | HR 78 | Temp 96.5°F | Resp 18

## 2020-12-02 DIAGNOSIS — C8510 Unspecified B-cell lymphoma, unspecified site: Secondary | ICD-10-CM

## 2020-12-02 MED ORDER — SODIUM CHLORIDE 0.9 % IV SOLN
Freq: Once | INTRAVENOUS | Status: AC
  Administered 2020-12-02: 8 mg via INTRAVENOUS
  Filled 2020-12-02: qty 4

## 2020-12-02 MED ORDER — SODIUM CHLORIDE 0.9 % IV SOLN
Freq: Once | INTRAVENOUS | Status: AC
  Filled 2020-12-02: qty 250

## 2020-12-02 MED ORDER — VINCRISTINE SULFATE CHEMO INJECTION 1 MG/ML
Freq: Once | INTRAVENOUS | Status: AC
  Filled 2020-12-02: qty 10

## 2020-12-02 MED ORDER — SODIUM CHLORIDE 0.9% FLUSH
10.0000 mL | INTRAVENOUS | Status: DC | PRN
  Administered 2020-12-02 (×2): 10 mL via INTRAVENOUS
  Filled 2020-12-02: qty 10

## 2020-12-02 NOTE — Patient Instructions (Addendum)
[facility] ONCOLOGY   Discharge Instructions: Thank you for choosing [facility] to provide your oncology and hematology care.  If you have a lab appointment with the [facility], please go directly to the [facility] and check in at the registration area.  Wear comfortable clothing and clothing appropriate for easy access to any Portacath or PICC line.   We strive to give you quality time with your provider. You may need to reschedule your appointment if you arrive late (15 or more minutes).  Arriving late affects you and other patients whose appointments are after yours.  Also, if you miss three or more appointments without notifying the office, you may be dismissed from the clinic at the provider's discretion.      For prescription refill requests, have your pharmacy contact our office and allow 72 hours for refills to be completed.    Today you received the following chemotherapy and/or immunotherapy agents: Adriamycin, Etoposide, Vincristine.      To help prevent nausea and vomiting after your treatment, we encourage you to take your nausea medication as directed.  BELOW ARE SYMPTOMS THAT SHOULD BE REPORTED IMMEDIATELY: *FEVER GREATER THAN 100.4 F (38 C) OR HIGHER *CHILLS OR SWEATING *NAUSEA AND VOMITING THAT IS NOT CONTROLLED WITH YOUR NAUSEA MEDICATION *UNUSUAL SHORTNESS OF BREATH *UNUSUAL BRUISING OR BLEEDING *URINARY PROBLEMS (pain or burning when urinating, or frequent urination) *BOWEL PROBLEMS (unusual diarrhea, constipation, pain near the anus) TENDERNESS IN MOUTH AND THROAT WITH OR WITHOUT PRESENCE OF ULCERS (sore throat, sores in mouth, or a toothache) UNUSUAL RASH, SWELLING OR PAIN  UNUSUAL VAGINAL DISCHARGE OR ITCHING   Items with * indicate a potential emergency and should be followed up as soon as possible or go to the Emergency Department if any problems should occur.  Please show the CHEMOTHERAPY ALERT CARD or  IMMUNOTHERAPY ALERT CARD at check-in to the Emergency Department and triage nurse.  Should you have questions after your visit or need to cancel or reschedule your appointment, please contact [facility]  [phone] and follow the prompts.  Office hours are 8:00 a.m. to 4:30 p.m. Monday - Friday. Please note that voicemails left after 4:00 p.m. may not be returned until the following business day.  We are closed weekends and major holidays. You have access to a nurse at all times for urgent questions. Please call the main number to the clinic [phone] and follow the prompts.  For any non-urgent questions, you may also contact your provider using MyChart. We now offer e-Visits for anyone 9 and older to request care online for non-urgent symptoms. For details visit [email or web address].   Also download the MyChart app! Go to the app store, search "MyChart", open the app, select [facility], and log in with your MyChart username and password.  Due to Covid, a mask is required upon entering the hospital/clinic. If you do not have a mask, one will be given to you upon arrival. For doctor visits, patients may have 1 support person aged 49 or older with them. For treatment visits, patients cannot have anyone with them due to current Covid guidelines and our immunocompromised population.   The chemotherapy medication bag should finish at 22 hours. For example, if your pump is scheduled for 46 hours and it was put on at 4:00 p.m., it should finish at 2:00 p.m. the day it is scheduled to come off regardless of your appointment time.     Estimated time  to finish is 12/03/2020 at 1:05 p.m.  If the pump alarms go off prior to the pump reading "Low Volume" then call [phone] and someone can assist you.  If the plunger comes out and the chemotherapy medication is leaking out, please use your home chemo spill kit to clean up the spill. Do NOT use paper towels or  other household products.  If you have problems or questions regarding your pump, please call either [phone] (24 hours a day) or the cancer center Monday-Friday 8:00 a.m.- 4:30 p.m. at the clinic number and we will assist you. If you are unable to get assistance, then go to the nearest Emergency Department and ask the staff to contact the IV team for assistance.

## 2020-12-03 ENCOUNTER — Inpatient Hospital Stay: Payer: Medicaid Other

## 2020-12-03 VITALS — BP 127/69 | HR 78 | Temp 96.7°F | Resp 18

## 2020-12-03 DIAGNOSIS — C8510 Unspecified B-cell lymphoma, unspecified site: Secondary | ICD-10-CM

## 2020-12-03 MED ORDER — SODIUM CHLORIDE 0.9 % IV SOLN
Freq: Once | INTRAVENOUS | Status: AC
  Filled 2020-12-03: qty 250

## 2020-12-03 MED ORDER — SODIUM CHLORIDE 0.9 % IV SOLN
Freq: Once | INTRAVENOUS | Status: AC
  Administered 2020-12-03: 8 mg via INTRAVENOUS
  Filled 2020-12-03: qty 4

## 2020-12-03 MED ORDER — VINCRISTINE SULFATE CHEMO INJECTION 1 MG/ML
Freq: Once | INTRAVENOUS | Status: AC
  Filled 2020-12-03: qty 10

## 2020-12-03 NOTE — Patient Instructions (Signed)
[facility] ONCOLOGY  Discharge Instructions: Thank you for choosing [facility] to provide your oncology and hematology care.  If you have a lab appointment with the [facility], please go directly to the [facility] and check in at the registration area.  Wear comfortable clothing and clothing appropriate for easy access to any Portacath or PICC line.   We strive to give you quality time with your provider. You may need to reschedule your appointment if you arrive late (15 or more minutes).  Arriving late affects you and other patients whose appointments are after yours.  Also, if you miss three or more appointments without notifying the office, you may be dismissed from the clinic at the provider's discretion.      For prescription refill requests, have your pharmacy contact our office and allow 72 hours for refills to be completed.    Today you received the following chemotherapy and/or immunotherapy agents Adriamycin, Etoposide and Vincristine       To help prevent nausea and vomiting after your treatment, we encourage you to take your nausea medication as directed.  BELOW ARE SYMPTOMS THAT SHOULD BE REPORTED IMMEDIATELY: *FEVER GREATER THAN 100.4 F (38 C) OR HIGHER *CHILLS OR SWEATING *NAUSEA AND VOMITING THAT IS NOT CONTROLLED WITH YOUR NAUSEA MEDICATION *UNUSUAL SHORTNESS OF BREATH *UNUSUAL BRUISING OR BLEEDING *URINARY PROBLEMS (pain or burning when urinating, or frequent urination) *BOWEL PROBLEMS (unusual diarrhea, constipation, pain near the anus) TENDERNESS IN MOUTH AND THROAT WITH OR WITHOUT PRESENCE OF ULCERS (sore throat, sores in mouth, or a toothache) UNUSUAL RASH, SWELLING OR PAIN  UNUSUAL VAGINAL DISCHARGE OR ITCHING   Items with * indicate a potential emergency and should be followed up as soon as possible or go to the Emergency Department if any problems should occur.  Please show the CHEMOTHERAPY ALERT CARD or  IMMUNOTHERAPY ALERT CARD at check-in to the Emergency Department and triage nurse.  Should you have questions after your visit or need to cancel or reschedule your appointment, please contact [facility]  [phone] and follow the prompts.  Office hours are 8:00 a.m. to 4:30 p.m. Monday - Friday. Please note that voicemails left after 4:00 p.m. may not be returned until the following business day.  We are closed weekends and major holidays. You have access to a nurse at all times for urgent questions. Please call the main number to the clinic [phone] and follow the prompts.  For any non-urgent questions, you may also contact your provider using MyChart. We now offer e-Visits for anyone 7 and older to request care online for non-urgent symptoms. For details visit [email or web address].   Also download the MyChart app! Go to the app store, search "MyChart", open the app, select [facility], and log in with your MyChart username and password.  Due to Covid, a mask is required upon entering the hospital/clinic. If you do not have a mask, one will be given to you upon arrival. For doctor visits, patients may have 1 support person aged 61 or older with them. For treatment visits, patients cannot have anyone with them due to current Covid guidelines and our immunocompromised population.

## 2020-12-04 ENCOUNTER — Inpatient Hospital Stay: Payer: Medicaid Other

## 2020-12-04 VITALS — BP 115/74 | HR 78 | Temp 97.0°F | Resp 18

## 2020-12-04 DIAGNOSIS — C8510 Unspecified B-cell lymphoma, unspecified site: Secondary | ICD-10-CM

## 2020-12-04 MED ORDER — VINCRISTINE SULFATE CHEMO INJECTION 1 MG/ML
Freq: Once | INTRAVENOUS | Status: AC
  Filled 2020-12-04: qty 10

## 2020-12-04 MED ORDER — SODIUM CHLORIDE 0.9 % IV SOLN
Freq: Once | INTRAVENOUS | Status: AC
  Administered 2020-12-04: 8 mg via INTRAVENOUS
  Filled 2020-12-04: qty 4

## 2020-12-04 MED ORDER — SODIUM CHLORIDE 0.9 % IV SOLN
Freq: Once | INTRAVENOUS | Status: AC
  Filled 2020-12-04: qty 250

## 2020-12-05 ENCOUNTER — Inpatient Hospital Stay: Payer: Medicaid Other

## 2020-12-05 VITALS — BP 138/91 | HR 91 | Temp 96.0°F

## 2020-12-05 DIAGNOSIS — C8510 Unspecified B-cell lymphoma, unspecified site: Secondary | ICD-10-CM

## 2020-12-05 MED ORDER — SODIUM CHLORIDE 0.9 % IV SOLN
Freq: Once | INTRAVENOUS | Status: AC
  Administered 2020-12-05: 10 mg via INTRAVENOUS
  Filled 2020-12-05: qty 8

## 2020-12-05 MED ORDER — PEGFILGRASTIM 6 MG/0.6ML ~~LOC~~ PSKT
6.0000 mg | PREFILLED_SYRINGE | Freq: Once | SUBCUTANEOUS | Status: AC
  Administered 2020-12-05: 6 mg via SUBCUTANEOUS
  Filled 2020-12-05: qty 0.6

## 2020-12-05 MED ORDER — HEPARIN SOD (PORK) LOCK FLUSH 100 UNIT/ML IV SOLN
500.0000 [IU] | Freq: Once | INTRAVENOUS | Status: AC
  Administered 2020-12-05: 500 [IU]
  Filled 2020-12-05: qty 5

## 2020-12-05 MED ORDER — SODIUM CHLORIDE 0.9 % IV SOLN
739.0000 mg/m2 | Freq: Once | INTRAVENOUS | Status: AC
  Administered 2020-12-05: 1500 mg via INTRAVENOUS
  Filled 2020-12-05: qty 50

## 2020-12-05 MED ORDER — SODIUM CHLORIDE 0.9 % IV SOLN
INTRAVENOUS | Status: DC
  Filled 2020-12-05: qty 250

## 2020-12-15 ENCOUNTER — Encounter
Admission: RE | Admit: 2020-12-15 | Discharge: 2020-12-15 | Disposition: A | Discharge status: Home / Self Care | Payer: Self-pay | Source: Ambulatory Visit | Attending: Oncology | Admitting: Oncology

## 2020-12-15 ENCOUNTER — Other Ambulatory Visit: Payer: Self-pay

## 2020-12-15 DIAGNOSIS — R591 Generalized enlarged lymph nodes: Secondary | ICD-10-CM | POA: Insufficient documentation

## 2020-12-15 LAB — GLUCOSE, CAPILLARY: Glucose-Capillary: 106 mg/dL — ABNORMAL HIGH (ref 70–99)

## 2020-12-15 MED ORDER — FLUDEOXYGLUCOSE F - 18 (FDG) INJECTION
9.1000 | Freq: Once | INTRAVENOUS | Status: AC | PRN
  Administered 2020-12-15: 9.6 via INTRAVENOUS

## 2020-12-16 ENCOUNTER — Other Ambulatory Visit: Payer: Self-pay | Admitting: Oncology

## 2020-12-17 ENCOUNTER — Inpatient Hospital Stay: Payer: Medicaid Other | Attending: Oncology | Admitting: Oncology

## 2020-12-17 ENCOUNTER — Encounter: Payer: Self-pay | Admitting: Oncology

## 2020-12-17 VITALS — BP 91/67 | HR 88 | Temp 97.9°F | Resp 18 | Wt 179.5 lb

## 2020-12-17 DIAGNOSIS — C8510 Unspecified B-cell lymphoma, unspecified site: Secondary | ICD-10-CM

## 2020-12-17 DIAGNOSIS — Z87891 Personal history of nicotine dependence: Secondary | ICD-10-CM | POA: Insufficient documentation

## 2020-12-17 DIAGNOSIS — D709 Neutropenia, unspecified: Secondary | ICD-10-CM | POA: Insufficient documentation

## 2020-12-17 DIAGNOSIS — Z8249 Family history of ischemic heart disease and other diseases of the circulatory system: Secondary | ICD-10-CM | POA: Insufficient documentation

## 2020-12-17 DIAGNOSIS — Z832 Family history of diseases of the blood and blood-forming organs and certain disorders involving the immune mechanism: Secondary | ICD-10-CM | POA: Insufficient documentation

## 2020-12-17 DIAGNOSIS — R5383 Other fatigue: Secondary | ICD-10-CM | POA: Insufficient documentation

## 2020-12-17 DIAGNOSIS — Z809 Family history of malignant neoplasm, unspecified: Secondary | ICD-10-CM | POA: Insufficient documentation

## 2020-12-17 DIAGNOSIS — G620 Drug-induced polyneuropathy: Secondary | ICD-10-CM | POA: Insufficient documentation

## 2020-12-17 DIAGNOSIS — Z79899 Other long term (current) drug therapy: Secondary | ICD-10-CM | POA: Insufficient documentation

## 2020-12-17 DIAGNOSIS — C8518 Unspecified B-cell lymphoma, lymph nodes of multiple sites: Secondary | ICD-10-CM | POA: Insufficient documentation

## 2020-12-17 DIAGNOSIS — T451X5A Adverse effect of antineoplastic and immunosuppressive drugs, initial encounter: Secondary | ICD-10-CM | POA: Insufficient documentation

## 2020-12-17 DIAGNOSIS — I700 Atherosclerosis of aorta: Secondary | ICD-10-CM | POA: Insufficient documentation

## 2020-12-17 DIAGNOSIS — Z8269 Family history of other diseases of the musculoskeletal system and connective tissue: Secondary | ICD-10-CM | POA: Insufficient documentation

## 2020-12-17 DIAGNOSIS — Z8349 Family history of other endocrine, nutritional and metabolic diseases: Secondary | ICD-10-CM | POA: Insufficient documentation

## 2020-12-17 NOTE — Progress Notes (Signed)
Has low energy; appetite is good. Neuropathy is worsening in legs. Appetite is improving.

## 2020-12-20 ENCOUNTER — Encounter: Payer: Self-pay | Admitting: Oncology

## 2020-12-20 NOTE — Progress Notes (Signed)
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  This was followed by a PET CT scan which showed extensive FDG avid adenopathy in the neck chest abdomen and pelvis.  FDG avid splenic lesions.  Solitary intramuscular FDG avid lesion in the right biceps femoris muscle.   Supraclavicular excisional lymph node biopsy showed high-grade B-cell lymphoma germinal center type Ki-67 greater than 95%.  FISH testing was positive for Bcl-2 BCL6 and MYC consistent with triple hit lymphoma.  By NCCN IPI score would be 4 based on age and elevated LDH and stage IV (intramuscular biceps femoris lesion) which puts him in the high intermediate risk group. 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   Patient received R[facility]OP for cycle 1 with plans for [facility] with cycle 2. IT MTX for CNS prophylaxis and he has received 3 cycles so far.   MRI brain negative for lymphoma.  Chemotherapy was not being able to escalated upwards due to neutropenia which has persisted to 3 weeks and he has been receiving treatment every 4 weeks   PET CT scan after 3 cycles of chemotherapy showedComplete or near complete metabolic response.  Residual disease in the anterior  upper right mediastinum slightly greater than background mediastinal activity  Interval history- reports ongoing fatigue. Tingling numbness in his extremities  ECOG PS- 1 Pain scale- 3  Review of systems- Review of Systems  Constitutional:  Positive for malaise/fatigue. Negative for chills, fever and weight loss.  HENT:  Negative for congestion, ear discharge and nosebleeds.   Eyes:  Negative for blurred vision.  Respiratory:  Negative for cough, hemoptysis, sputum production, shortness of breath and wheezing.   Cardiovascular:  Negative for chest pain, palpitations, orthopnea and claudication.  Gastrointestinal:  Negative for abdominal pain, blood in stool, constipation, diarrhea, heartburn, melena, nausea and vomiting.  Genitourinary:  Negative for dysuria, flank pain, frequency, hematuria and urgency.  Musculoskeletal:  Negative for back pain, joint pain and myalgias.  Skin:  Negative for rash.  Neurological:  Negative for dizziness, tingling, focal weakness, seizures, weakness and headaches.  Endo/Heme/Allergies:  Does not bruise/bleed easily.  Psychiatric/Behavioral:  Negative for depression and suicidal ideas. The patient does not have insomnia.       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/2020     Past Surgical History:  Procedure Laterality Date   BONE MARROW BIOPSY     COLONOSCOPY     EXCISION MASS NECK Right 06/12/2020   Procedure: EXCISION MASS NECK;  Surgeon: Jules Husbands, MD;  Location: [facility] ORS;  Service: General;  Laterality: Right;   HERNIA REPAIR Right  at age 59-RIH   PORTA [place] Right 06/24/2020   Procedure: INSERTION PORT-A-CATH;  Surgeon: Jules Husbands, MD;  Location: [facility] ORS;  Service: General;  Laterality: Right;    Social History   Socioeconomic History   Marital status: Single     Spouse name: Not on file   Number of children: Not on file   Years of education: Not on file   Highest education level: Not on file  Occupational History   Not on file  Tobacco Use   Smoking status: Former    Types: Cigarettes    Quit date: 06/13/2020    Years since quitting: 0.5   Smokeless tobacco: Never  Vaping Use   Vaping Use: Never used  Substance and Sexual Activity   Alcohol use: Not Currently   Drug use: Not Currently    Types: Marijuana   Sexual activity: Not Currently  Other Topics Concern   Not on file  Social History Narrative   Has daughter and grandson in the home   Social Determinants of Health   Financial Resource Strain: Not on file  Food Insecurity: Not on file  Transportation Needs: Not on file  Physical Activity: Not on file  Stress: Not on file  Social Connections: Not on file  Intimate Partner Violence: Not on file    Family History  Problem Relation Age of Onset   Anemia Mother    Hypertension Mother    Goiter Mother    Cancer Father    Cancer Sister    Multiple sclerosis Sister      Current Outpatient Medications:    allopurinol (ZYLOPRIM) 300 MG tablet, Take 1 tablet (300 mg total) by mouth daily., Disp: 30 tablet, Rfl: 1   atenolol (TENORMIN) 25 MG tablet, Take 25 mg by mouth daily., Disp: , Rfl:    lisinopril (ZESTRIL) 10 MG tablet, Take 10 mg by mouth daily., Disp: , Rfl:    VITAMIN D PO, Take 1 capsule by mouth daily., Disp: , Rfl:    HYDROcodone-acetaminophen (NORCO/VICODIN) 5-325 MG tablet, Take 1 tablet by mouth every 6 (six) hours as needed for moderate pain. (Patient not taking: No sig reported), Disp: 15 tablet, Rfl: 0 No current facility-administered medications for this visit.  Facility-Administered Medications Ordered in Other Visits:    0.9 %  sodium chloride infusion, , Intravenous, Continuous, Sindy Guadeloupe, MD, Stopped at 08/12/20 1310   0.9 %  sodium chloride infusion, , Intravenous, Continuous, Sindy Guadeloupe, MD,  Stopped at 09/08/20 0944   0.9 %  sodium chloride infusion, , Intravenous, Continuous, Sindy Guadeloupe, MD, Stopped at 09/09/20 1507   0.9 %  sodium chloride infusion, , Intravenous, Continuous, Rogue Bussing, Elisha Headland, MD, Stopped at 09/11/20 1502   0.9 %  sodium chloride infusion, , Intravenous, Continuous, Sindy Guadeloupe, MD, Stopped at 10/06/20 1330   0.9 %  sodium chloride infusion, , Intravenous, Continuous, Sindy Guadeloupe, MD, Stopped at 11/03/20 1225   0.9 %  sodium chloride infusion, , Intravenous, Continuous, Sindy Guadeloupe, MD, Stopped at 11/06/20 1424   0.9 %  sodium chloride infusion, , Intravenous, Continuous, Sindy Guadeloupe, MD, Stopped at 12/01/20 1213   heparin lock flush 100 unit/mL, 500 Units, Intravenous, Once, Sindy Guadeloupe, MD   heparin lock flush 100 unit/mL, 500 Units, Intracatheter, Once PRN, Sindy Guadeloupe, MD   methotrexate (PF) 12 mg in sodium chloride (PF) 0.9 % INTRATHECAL chemo injection, , Intrathecal,  Once, Sindy Guadeloupe, MD   sodium chloride flush (NS) 0.9 % injection 10 mL, 10 mL, Intravenous, PRN, Sindy Guadeloupe, MD, 10 mL at 08/13/20 1510   sodium chloride flush (NS) 0.9 % injection 10 mL, 10 mL, Intravenous, PRN, Sindy Guadeloupe, MD, 10 mL at 11/04/20 1408   sodium chloride flush (NS) 0.9 % injection 10 mL, 10 mL, Intracatheter, PRN, Sindy Guadeloupe, MD   sodium chloride flush (NS) 0.9 % injection 10 mL, 10 mL, Intravenous, PRN, Sindy Guadeloupe, MD, 10 mL at 12/02/20 1505  Physical exam:  Vitals:   12/17/20 1130  BP: 91/67  Pulse: 88  Resp: 18  Temp: 97.9 F (36.6 C)  TempSrc: Tympanic  SpO2: 100%  Weight: 179 lb 8 oz (81.4 kg)   Physical Exam Eyes:     Pupils: Pupils are equal, round, and reactive to light.  Cardiovascular:     Rate and Rhythm: Normal rate and regular rhythm.     Heart sounds: Normal heart sounds.  Pulmonary:     Effort: Pulmonary effort is normal.     Breath sounds: Normal breath sounds.  Abdominal:     General: Bowel sounds  are normal.     Palpations: Abdomen is soft.  Skin:    General: Skin is warm and dry.  Neurological:     Mental Status: He is alert and oriented to person, place, and time.     CMP Latest Ref Rng & Units 12/01/2020  Glucose 70 - 99 mg/dL 138(H)  BUN 8 - 23 mg/dL 12  Creatinine 0.61 - 1.24 mg/dL 0.94  Sodium 135 - 145 mmol/L 139  Potassium 3.5 - 5.1 mmol/L 3.6  Chloride 98 - 111 mmol/L 106  CO2 22 - 32 mmol/L 26  Calcium 8.9 - 10.3 mg/dL 9.2  Total Protein 6.5 - 8.1 g/dL 6.8  Total Bilirubin 0.3 - 1.2 mg/dL 0.5  Alkaline Phos 38 - 126 U/L 76  AST 15 - 41 U/L 30  ALT 0 - 44 U/L 22   CBC Latest Ref Rng & Units 12/01/2020  WBC 4.0 - 10.5 K/uL 4.0  Hemoglobin 13.0 - 17.0 g/dL 12.0(L)  Hematocrit 39.0 - 52.0 % 36.2(L)  Platelets 150 - 400 K/uL 289    No images are attached to the encounter.  NM PET Image Restag (PS) Skull Base To Thigh  Result Date: 12/16/2020 CLINICAL DATA:  Subsequent treatment strategy for high-grade B-cell lymphoma. Ongoing chemotherapy. EXAM: NUCLEAR MEDICINE PET SKULL BASE TO THIGH TECHNIQUE: 9.6 mCi F-18 FDG was injected intravenously. Full-ring PET imaging was performed from the skull base to thigh after the radiotracer. CT data was obtained and used for attenuation correction and anatomic localization. Fasting blood glucose: 106 mg/dl COMPARISON:  Multiple exams, including PET-CT from 09/17/2020 FINDINGS: Mediastinal blood pool activity: SUV max 2.6 Liver activity: SUV max 3.7 NECK: No significant abnormal hypermetabolic activity in this region. Incidental CT findings: none CHEST: Indistinctness of tissue planes between the trachea, SVC, and brachiocephalic artery likely residual from prior treated adenopathy, maximum SUV 3.2 (Deauville 3), previously 3.0 by my measurements. No sharply defined residual lymph nodes or bulky adenopathy. Incidental CT findings: Right IJ central line tip: SVC. ABDOMEN/PELVIS: Accentuated activity along the gastric antrum without  obvious wall thickening or specific abnormality on the CT data, maximum SUV 6.5, previously 5.8, most likely physiologic in this setting. Previously referenced right pelvic sidewall lymph node 0.7 cm in short axis on image 234 of series 3 with maximum SUV  1.8 (Deauville 2), previously 0.8 cm in short axis with maximum SUV of 1.4. No new or pathologically enlarged lymph nodes in the abdomen. No splenomegaly or hypermetabolic splenic activity. Anal activity with maximum SUV 8.5, formerly 8.3, no definite mass like appearance, probably physiologic. Incidental CT findings: Atherosclerosis is present, including aortoiliac atherosclerotic disease. SKELETON: No significant abnormal hypermetabolic activity in this region. No recurrence of the previous mass posterior to the right proximal femur shown on the 05/26/2020 exam. Incidental CT findings: none IMPRESSION: 1. Continued findings of excellent good response to therapy. Indistinctness of soft tissue planes in the upper mediastinum corresponding to prior adenopathy currently Deauville 3 and previously Deauville 3. No recurrent pelvic adenopathy or new adenopathy identified. 2.  Aortic Atherosclerosis (ICD10-I70.0). Electronically Signed   By: Van Clines M.D.   On: 12/16/2020 08:14     Assessment and plan- Patient is a 60 y.o. male with stage IV triple high-grade B-cell lymphoma s/p 6 cycles of R-EPOCH chemotherapy.  He is here to discuss pet CT scan results and further management  I have reviewed PET/CT scan images independently and discussed findings with the patient which overall shows excellent response to treatmentAnd no residual hypermetabolic adenopathy.  There is still indistinctness of tissue planes in the mediastinum from prior treated adenopathy but is reported to be Deauville 3.  I will discuss these findings in the tumor board next week.  I do not think that this requires a rebiopsy at this time and I will plan to get a repeat PET scan in 3 months  and see him thereafter.  Also at baseline patient prior to his low-grade lymphoma involvement in his bone marrow but no evidence of high-grade B-cell lymphoma.  I will repeat a bone marrow biopsy at this time.  We will leave the port in place and he will continue to get port flushes every 3 months.  Fatigue: Likely secondary to chemotherapy continue to monitor  Chemo induced peripheral neuropathy: We discussed if he would like to start medications for the same and he would like to hold off on that at this time.  Patient is also not compliant with hepatitis B prophylaxis with tenofovir     Visit Diagnosis 1. High grade B-cell lymphoma ([place])   2. Chemotherapy-induced peripheral neuropathy ([place])   3. Chemotherapy-induced fatigue      Dr. Randa Evens, MD, MPH [facility] [phone] 12/20/2020 3:47 PM

## 2020-12-25 ENCOUNTER — Other Ambulatory Visit: Payer: Medicaid Other

## 2020-12-25 NOTE — Progress Notes (Signed)
Tumor Board Documentation  Christopher Mills was presented by Dr Janese Banks at our Tumor Board on 12/25/2020, which included representatives from medical oncology, radiation oncology, internal medicine, navigation, pathology, radiology, surgical, pulmonology, genetics, research, pharmacy.  Christopher Mills currently presents as a current patient, for discussion with history of the following treatments: neoadjuvant chemotherapy, active survellience.  Additionally, we reviewed previous medical and familial history, history of present illness, and recent lab results along with all available histopathologic and imaging studies. The tumor board considered available treatment options and made the following recommendations:   Radiologist Dr Dia Sitter will contact Dr Janese Banks,  Observation vs biopsy  The following procedures/referrals were also placed: No orders of the defined types were placed in this encounter.   Clinical Trial Status: not discussed   Staging used: To be determined  National site-specific guidelines   were discussed with respect to the case.  Tumor board is a meeting of clinicians from various specialty areas who evaluate and discuss patients for whom a multidisciplinary approach is being considered. Final determinations in the plan of care are those of the provider(s). The responsibility for follow up of recommendations given during tumor board is that of the provider.   Today's extended care, comprehensive team conference, Christopher Mills was not present for the discussion and was not examined.   Multidisciplinary Tumor Board is a multidisciplinary case peer review process.  Decisions discussed in the Multidisciplinary Tumor Board reflect the opinions of the specialists present at the conference without having examined the patient.  Ultimately, treatment and diagnostic decisions rest with the primary provider(s) and the patient.

## 2021-01-02 ENCOUNTER — Encounter: Payer: Self-pay | Admitting: Oncology

## 2021-03-12 ENCOUNTER — Encounter: Payer: Self-pay | Admitting: Oncology

## 2021-03-17 ENCOUNTER — Ambulatory Visit
Admission: RE | Admit: 2021-03-17 | Discharge: 2021-03-17 | Disposition: A | Discharge status: Home / Self Care | Payer: Self-pay | Source: Ambulatory Visit | Attending: Oncology | Admitting: Oncology

## 2021-03-17 ENCOUNTER — Encounter: Payer: Self-pay | Admitting: Oncology

## 2021-03-17 DIAGNOSIS — Z08 Encounter for follow-up examination after completed treatment for malignant neoplasm: Secondary | ICD-10-CM | POA: Insufficient documentation

## 2021-03-17 DIAGNOSIS — C8510 Unspecified B-cell lymphoma, unspecified site: Secondary | ICD-10-CM | POA: Insufficient documentation

## 2021-03-17 LAB — GLUCOSE, CAPILLARY: Glucose-Capillary: 101 mg/dL — ABNORMAL HIGH (ref 70–99)

## 2021-03-17 IMAGING — CT NM PET TUM IMG RESTAG (PS) SKULL BASE T - THIGH
1 of 11 series · 1 of 25 positions shown · non-contrast
Comparison: [DATE]

CLINICAL DATA: Subsequent treatment strategy for lymphoma.

EXAM:
NUCLEAR MEDICINE PET SKULL BASE TO THIGH
TECHNIQUE: 9.5 mCi F-18 FDG was injected intravenously. Full-ring PET imaging
was performed from the skull base to thigh after the radiotracer. CT
data was obtained and used for attenuation correction and anatomic
localization.
Fasting blood glucose: 101 mg/dl

[Series 3: ct wb 5.0 b30f · axial · 5.0mm · 0.98mm/px · 1 of 329 slices shown]
[im 329/329  brain]
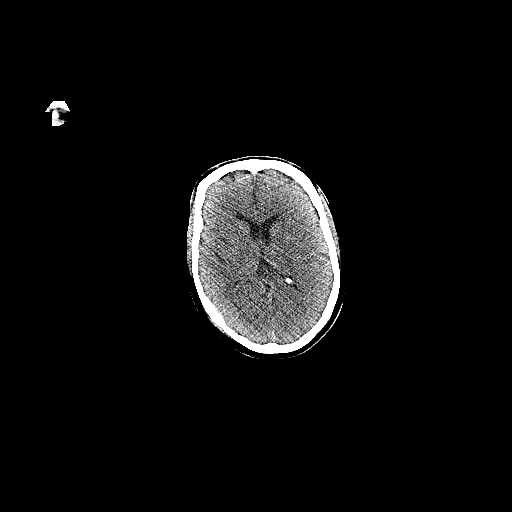

[1 of 25 positions shown; findings below may reference images not displayed]

FINDINGS: Mediastinal blood pool activity: SUV max

Liver activity: SUV max

NECK: No hypermetabolic lymph nodes in the neck.

Incidental CT findings: none

CHEST: Indistinct soft tissue within the high right paratracheal
region consistent with treated tumor is again noted within SUV max
2.86 ([HOSPITAL] criteria 3), image 85/3. This is unchanged compared
with the previous exam. No new sites of disease identified within
the chest.

Incidental CT findings: Mild aortic atherosclerosis.

ABDOMEN/PELVIS: No abnormal tracer uptake identified within the
liver, pancreas, spleen, or adrenal glands. The spleen has a normal
size. No tracer avid lymph node or mass identified within the
abdomen or pelvis. The previously referenced right pelvic sidewall
lymph node has a short axis of 6 mm an SUV max of 1.7 ([HOSPITAL]
criteria 2).

Again noted is increased radiotracer uptake along the distal stomach
and proximal duodenum with SUV max of 8.13. Previously 6.5. On the
corresponding CT images there is no mass identified. Favor
physiologic activity.

Similar appearance of increased tracer activity at the level of the
anus within SUV max of 4.98. Formally 8.5. Favor physiologic
activity.

Incidental CT findings: Aortic atherosclerosis without aneurysm.

SKELETON: No focal hypermetabolic activity to suggest skeletal
metastasis.

Incidental CT findings: none
IMPRESSION: 1. Stable exam. No specific findings identified to suggest residual
tracer avid tumor. No new sites of disease.
2. Subtle soft tissue within the high right paratracheal region of
the upper mediastinum corresponding to treated tumor is stable with
mild residual tracer activity ([HOSPITAL] criteria 3).
3. Again noted is increased tracer uptake localizing to the distal
stomach and an anus without corresponding mass on the CT images.
Favor physiologic activity.

## 2021-03-17 MED ORDER — FLUDEOXYGLUCOSE F - 18 (FDG) INJECTION
9.3000 | Freq: Once | INTRAVENOUS | Status: AC | PRN
  Administered 2021-03-17: 9.5 via INTRAVENOUS

## 2021-03-18 ENCOUNTER — Encounter: Payer: Self-pay | Admitting: Oncology

## 2021-03-18 ENCOUNTER — Other Ambulatory Visit: Payer: Self-pay

## 2021-03-18 ENCOUNTER — Inpatient Hospital Stay (HOSPITAL_BASED_OUTPATIENT_CLINIC_OR_DEPARTMENT_OTHER): Payer: 59 | Admitting: Oncology

## 2021-03-18 ENCOUNTER — Other Ambulatory Visit: Payer: Self-pay | Admitting: Oncology

## 2021-03-18 ENCOUNTER — Inpatient Hospital Stay: Payer: 59 | Attending: Oncology

## 2021-03-18 VITALS — BP 122/83 | HR 74 | Temp 97.2°F | Resp 16 | Ht 73.0 in | Wt 189.3 lb

## 2021-03-18 DIAGNOSIS — R7402 Elevation of levels of lactic acid dehydrogenase (LDH): Secondary | ICD-10-CM | POA: Insufficient documentation

## 2021-03-18 DIAGNOSIS — C8333 Diffuse large B-cell lymphoma, intra-abdominal lymph nodes: Secondary | ICD-10-CM | POA: Insufficient documentation

## 2021-03-18 DIAGNOSIS — Z8579 Personal history of other malignant neoplasms of lymphoid, hematopoietic and related tissues: Secondary | ICD-10-CM

## 2021-03-18 DIAGNOSIS — Z87891 Personal history of nicotine dependence: Secondary | ICD-10-CM | POA: Insufficient documentation

## 2021-03-18 DIAGNOSIS — I700 Atherosclerosis of aorta: Secondary | ICD-10-CM | POA: Insufficient documentation

## 2021-03-18 DIAGNOSIS — Z8249 Family history of ischemic heart disease and other diseases of the circulatory system: Secondary | ICD-10-CM | POA: Insufficient documentation

## 2021-03-18 DIAGNOSIS — Z8349 Family history of other endocrine, nutritional and metabolic diseases: Secondary | ICD-10-CM | POA: Insufficient documentation

## 2021-03-18 DIAGNOSIS — Z809 Family history of malignant neoplasm, unspecified: Secondary | ICD-10-CM | POA: Insufficient documentation

## 2021-03-18 DIAGNOSIS — C8510 Unspecified B-cell lymphoma, unspecified site: Secondary | ICD-10-CM

## 2021-03-18 DIAGNOSIS — Z9221 Personal history of antineoplastic chemotherapy: Secondary | ICD-10-CM | POA: Insufficient documentation

## 2021-03-18 DIAGNOSIS — Z79899 Other long term (current) drug therapy: Secondary | ICD-10-CM | POA: Insufficient documentation

## 2021-03-18 DIAGNOSIS — Z8269 Family history of other diseases of the musculoskeletal system and connective tissue: Secondary | ICD-10-CM | POA: Insufficient documentation

## 2021-03-18 DIAGNOSIS — Z832 Family history of diseases of the blood and blood-forming organs and certain disorders involving the immune mechanism: Secondary | ICD-10-CM | POA: Insufficient documentation

## 2021-03-18 DIAGNOSIS — Z08 Encounter for follow-up examination after completed treatment for malignant neoplasm: Secondary | ICD-10-CM

## 2021-03-18 LAB — CBC WITH DIFFERENTIAL/PLATELET
Abs Immature Granulocytes: 0.16 10*3/uL — ABNORMAL HIGH (ref 0.00–0.07)
Basophils Absolute: 0 10*3/uL (ref 0.0–0.1)
Basophils Relative: 0 %
Eosinophils Absolute: 0 10*3/uL (ref 0.0–0.5)
Eosinophils Relative: 0 %
HCT: 38.8 % — ABNORMAL LOW (ref 39.0–52.0)
Hemoglobin: 13.4 g/dL (ref 13.0–17.0)
Immature Granulocytes: 4 %
Lymphocytes Relative: 35 %
Lymphs Abs: 1.4 10*3/uL (ref 0.7–4.0)
MCH: 30.5 pg (ref 26.0–34.0)
MCHC: 34.5 g/dL (ref 30.0–36.0)
MCV: 88.2 fL (ref 80.0–100.0)
Monocytes Absolute: 0.4 10*3/uL (ref 0.1–1.0)
Monocytes Relative: 11 %
Neutro Abs: 1.9 10*3/uL (ref 1.7–7.7)
Neutrophils Relative %: 50 %
Platelets: 212 10*3/uL (ref 150–400)
RBC: 4.4 MIL/uL (ref 4.22–5.81)
RDW: 13.7 % (ref 11.5–15.5)
WBC: 3.9 10*3/uL — ABNORMAL LOW (ref 4.0–10.5)
nRBC: 0 % (ref 0.0–0.2)

## 2021-03-18 LAB — COMPREHENSIVE METABOLIC PANEL
ALT: 25 U/L (ref 0–44)
AST: 31 U/L (ref 15–41)
Albumin: 4.2 g/dL (ref 3.5–5.0)
Alkaline Phosphatase: 92 U/L (ref 38–126)
Anion gap: 6 (ref 5–15)
BUN: 12 mg/dL (ref 8–23)
CO2: 26 mmol/L (ref 22–32)
Calcium: 9.4 mg/dL (ref 8.9–10.3)
Chloride: 104 mmol/L (ref 98–111)
Creatinine, Ser: 0.96 mg/dL (ref 0.61–1.24)
GFR, Estimated: >60 mL/min (ref >60)
Glucose, Bld: 88 mg/dL (ref 70–99)
Potassium: 3.7 mmol/L (ref 3.5–5.1)
Sodium: 136 mmol/L (ref 135–145)
Total Bilirubin: 0.3 mg/dL (ref 0.3–1.2)
Total Protein: 7 g/dL (ref 6.5–8.1)

## 2021-03-18 LAB — LACTATE DEHYDROGENASE: LDH: 219 U/L — ABNORMAL HIGH (ref 98–192)

## 2021-03-18 MED ORDER — SODIUM CHLORIDE 0.9% FLUSH
10.0000 mL | Freq: Once | INTRAVENOUS | Status: AC
  Administered 2021-03-18: 10 mL via INTRAVENOUS
  Filled 2021-03-18: qty 10

## 2021-03-18 MED ORDER — HEPARIN SOD (PORK) LOCK FLUSH 100 UNIT/ML IV SOLN
500.0000 [IU] | Freq: Once | INTRAVENOUS | Status: AC
  Administered 2021-03-18: 500 [IU] via INTRAVENOUS
  Filled 2021-03-18: qty 5

## 2021-03-18 NOTE — Progress Notes (Signed)
Pt is doing well and is just here to get results.

## 2021-03-18 NOTE — Progress Notes (Signed)
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  This was followed by a PET CT scan which showed extensive FDG avid adenopathy in the neck chest abdomen and pelvis.  FDG avid splenic lesions.  Solitary intramuscular FDG avid lesion in the right biceps femoris muscle.   Supraclavicular excisional lymph node biopsy showed high-grade B-cell lymphoma germinal center type Ki-67 greater than 95%.  FISH testing was positive for Bcl-2 BCL6 and MYC consistent with triple hit lymphoma.  By NCCN IPI score would be 4 based on age and elevated LDH and stage IV (intramuscular biceps femoris lesion) which puts him in the high intermediate risk group. 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   Patient received RCHOP for cycle 1 with plans for DA [facility] with cycle 2. IT MTX for CNS prophylaxis and he has received 3 cycles but declined further cycles.  MRI brain negative for lymphoma.  Chemotherapy was not being able to escalated upwards due to neutropenia which has persisted to 3 weeks and he has been receiving treatment every 4 weeks   PET CT scan after 3 cycles of chemotherapy  showedComplete or near complete metabolic response.  Residual disease in the anterior upper right mediastinum slightly greater than background mediastinal activity  Interval history-patient is doing well overall.  Denies any specific complaints at this time appetite and weight have remained stable  ECOG PS- 0 Pain scale- 0   Review of systems- Review of Systems  Constitutional:  Negative for chills, fever, malaise/fatigue and weight loss.  HENT:  Negative for congestion, ear discharge and nosebleeds.   Eyes:  Negative for blurred vision.  Respiratory:  Negative for cough, hemoptysis, sputum production, shortness of breath and wheezing.   Cardiovascular:  Negative for chest pain, palpitations, orthopnea and claudication.  Gastrointestinal:  Negative for abdominal pain, blood in stool, constipation, diarrhea, heartburn, melena, nausea and vomiting.  Genitourinary:  Negative for dysuria, flank pain, frequency, hematuria and urgency.  Musculoskeletal:  Negative for back pain, joint pain and myalgias.  Skin:  Negative for rash.  Neurological:  Negative for dizziness, tingling, focal weakness, seizures, weakness and headaches.  Endo/Heme/Allergies:  Does not bruise/bleed easily.  Psychiatric/Behavioral:  Negative for depression and suicidal ideas. The patient does not have insomnia.      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     Past Surgical History:  Procedure Laterality Date   BONE MARROW BIOPSY     COLONOSCOPY     EXCISION MASS NECK Right 06/12/2020   Procedure: EXCISION MASS NECK;  Surgeon: Caroleen Hamman  F, MD;  Location: [facility] ORS;  Service: General;  Laterality: Right;   HERNIA REPAIR Right    at age 63-RIH   [address] [place] Right 06/24/2020   Procedure: INSERTION PORT-A-CATH;  Surgeon: Jules Husbands, MD;   Location: [facility] ORS;  Service: General;  Laterality: Right;    Social History   Socioeconomic History   Marital status: Single    Spouse name: Not on file   Number of children: Not on file   Years of education: Not on file   Highest education level: Not on file  Occupational History   Not on file  Tobacco Use   Smoking status: Former    Types: Cigarettes    Quit date: 06/13/2020    Years since quitting: 0.7   Smokeless tobacco: Never  Vaping Use   Vaping Use: Never used  Substance and Sexual Activity   Alcohol use: Not Currently   Drug use: Not Currently    Types: Marijuana   Sexual activity: Not Currently  Other Topics Concern   Not on file  Social History Narrative   Has daughter and grandson in the home   Social Determinants of Health   Financial Resource Strain: Not on file  Food Insecurity: Not on file  Transportation Needs: Not on file  Physical Activity: Not on file  Stress: Not on file  Social Connections: Not on file  Intimate Partner Violence: Not on file    Family History  Problem Relation Age of Onset   Anemia Mother    Hypertension Mother    Goiter Mother    Cancer Father    Cancer Sister    Multiple sclerosis Sister      Current Outpatient Medications:    allopurinol (ZYLOPRIM) 300 MG tablet, Take 1 tablet (300 mg total) by mouth daily., Disp: 30 tablet, Rfl: 1   atenolol (TENORMIN) 25 MG tablet, Take 25 mg by mouth daily., Disp: , Rfl:    lisinopril (ZESTRIL) 10 MG tablet, Take 10 mg by mouth daily., Disp: , Rfl:    HYDROcodone-acetaminophen (NORCO/VICODIN) 5-325 MG tablet, Take 1 tablet by mouth every 6 (six) hours as needed for moderate pain. (Patient not taking: Reported on 09/08/2020), Disp: 15 tablet, Rfl: 0   VITAMIN D PO, Take 1 capsule by mouth daily. (Patient not taking: Reported on 03/18/2021), Disp: , Rfl:  No current facility-administered medications for this visit.  Facility-Administered Medications Ordered in Other Visits:    0.9 %   sodium chloride infusion, , Intravenous, Continuous, Sindy Guadeloupe, MD, Stopped at 08/12/20 1310   0.9 %  sodium chloride infusion, , Intravenous, Continuous, Sindy Guadeloupe, MD, Stopped at 09/08/20 0944   0.9 %  sodium chloride infusion, , Intravenous, Continuous, Sindy Guadeloupe, MD, Stopped at 09/09/20 1507   0.9 %  sodium chloride infusion, , Intravenous, Continuous, Rogue Bussing, Elisha Headland, MD, Stopped at 09/11/20 1502   0.9 %  sodium chloride infusion, , Intravenous, Continuous, Sindy Guadeloupe, MD, Stopped at 10/06/20 1330   0.9 %  sodium chloride infusion, , Intravenous, Continuous, Sindy Guadeloupe, MD, Stopped at 11/03/20 1225   0.9 %  sodium chloride infusion, , Intravenous, Continuous, Sindy Guadeloupe, MD, Stopped at 11/06/20 1424   0.9 %  sodium chloride infusion, , Intravenous, Continuous, Sindy Guadeloupe, MD, Stopped at 12/01/20 1213   heparin lock flush 100 unit/mL, 500 Units, Intravenous, Once, Sindy Guadeloupe, MD   heparin lock flush 100 unit/mL,  500 Units, Intracatheter, Once PRN, Sindy Guadeloupe, MD   methotrexate (PF) 12 mg in sodium chloride (PF) 0.9 % INTRATHECAL chemo injection, , Intrathecal, Once, Sindy Guadeloupe, MD   sodium chloride flush (NS) 0.9 % injection 10 mL, 10 mL, Intravenous, PRN, Sindy Guadeloupe, MD, 10 mL at 08/13/20 1510   sodium chloride flush (NS) 0.9 % injection 10 mL, 10 mL, Intravenous, PRN, Sindy Guadeloupe, MD, 10 mL at 11/04/20 1408   sodium chloride flush (NS) 0.9 % injection 10 mL, 10 mL, Intracatheter, PRN, Sindy Guadeloupe, MD   sodium chloride flush (NS) 0.9 % injection 10 mL, 10 mL, Intravenous, PRN, Sindy Guadeloupe, MD, 10 mL at 12/02/20 1505  Physical exam:  Vitals:   03/18/21 1112  BP: 122/83  Pulse: 74  Resp: 16  Temp: (!) 97.2 F (36.2 C)  TempSrc: Tympanic  Weight: 189 lb 4.8 oz (85.9 kg)  Height: $Remove'6\' 1"'wxxvojg$  (1.854 m)   Physical Exam Constitutional:      General: He is not in acute distress. Cardiovascular:     Rate and Rhythm: Normal rate and  regular rhythm.     Heart sounds: Normal heart sounds.  Pulmonary:     Effort: Pulmonary effort is normal.     Breath sounds: Normal breath sounds.  Skin:    General: Skin is warm and dry.  Neurological:     Mental Status: He is alert and oriented to person, place, and time.     CMP Latest Ref Rng & Units 03/18/2021  Glucose 70 - 99 mg/dL 88  BUN 8 - 23 mg/dL 12  Creatinine 0.61 - 1.24 mg/dL 0.96  Sodium 135 - 145 mmol/L 136  Potassium 3.5 - 5.1 mmol/L 3.7  Chloride 98 - 111 mmol/L 104  CO2 22 - 32 mmol/L 26  Calcium 8.9 - 10.3 mg/dL 9.4  Total Protein 6.5 - 8.1 g/dL 7.0  Total Bilirubin 0.3 - 1.2 mg/dL 0.3  Alkaline Phos 38 - 126 U/L 92  AST 15 - 41 U/L 31  ALT 0 - 44 U/L 25   CBC Latest Ref Rng & Units 03/18/2021  WBC 4.0 - 10.5 K/uL 3.9(L)  Hemoglobin 13.0 - 17.0 g/dL 13.4  Hematocrit 39.0 - 52.0 % 38.8(L)  Platelets 150 - 400 K/uL 212    No images are attached to the encounter.  NM PET Image Restag (PS) Skull Base To Thigh  Result Date: 03/17/2021 CLINICAL DATA:  Subsequent treatment strategy for lymphoma. EXAM: NUCLEAR MEDICINE PET SKULL BASE TO THIGH TECHNIQUE: 9.5 mCi F-18 FDG was injected intravenously. Full-ring PET imaging was performed from the skull base to thigh after the radiotracer. CT data was obtained and used for attenuation correction and anatomic localization. Fasting blood glucose: 101 mg/dl COMPARISON:  12/15/2020 FINDINGS: Mediastinal blood pool activity: SUV max 2.88 Liver activity: SUV max 3.93 NECK: No hypermetabolic lymph nodes in the neck. Incidental CT findings: none CHEST: Indistinct soft tissue within the high right paratracheal region consistent with treated tumor is again noted within SUV max 2.86 (Deauville criteria 3), image 85/3. This is unchanged compared with the previous exam. No new sites of disease identified within the chest. Incidental CT findings: Mild aortic atherosclerosis. ABDOMEN/PELVIS: No abnormal tracer uptake identified within the  liver, pancreas, spleen, or adrenal glands. The spleen has a normal size. No tracer avid lymph node or mass identified within the abdomen or pelvis. The previously referenced right pelvic sidewall lymph node has a short axis of 6 mm  an SUV max of 1.7 (Deauville criteria 2). Again noted is increased radiotracer uptake along the distal stomach and proximal duodenum with SUV max of 8.13. Previously 6.5. On the corresponding CT images there is no mass identified. Favor physiologic activity. Similar appearance of increased tracer activity at the level of the anus within SUV max of 4.98. Formally 8.5. Favor physiologic activity. Incidental CT findings: Aortic atherosclerosis without aneurysm. SKELETON: No focal hypermetabolic activity to suggest skeletal metastasis. Incidental CT findings: none IMPRESSION: 1. Stable exam. No specific findings identified to suggest residual tracer avid tumor. No new sites of disease. 2. Subtle soft tissue within the high right paratracheal region of the upper mediastinum corresponding to treated tumor is stable with mild residual tracer activity (Deauville criteria 3). 3. Again noted is increased tracer uptake localizing to the distal stomach and an anus without corresponding mass on the CT images. Favor physiologic activity. Electronically Signed   By: Kerby Moors M.D.   On: 03/17/2021 16:48     Assessment and plan- Patient is a 61 y.o. male with stage IV triple high-grade B-cell lymphoma s/p 6 cycles of R-EPOCH chemotherapy.  He is here to discuss PET CT scan results and further management  I have reviewed PET/CT scan images independently and discussed findings with the patient. Overall PET CT scan does not show any evidence of recurrent or progressive disease.  There is soft tissue density in the right paratracheal region/upper mediastinum which had a Deauville score of 3 even on his prior PET scan in October and has not changed consistent with treated tumor.  Also has chronic  tracer uptake in the distal stomach and anus favoring physiologic activity.  He is currently in CR 1 after chemotherapy.  I will consider getting a PET CT scan again in 6 months time but clinically and radiologically patient is currently in remission.  I will see him back in 3 months with CBC with differential CMP and LDH.  His port will remain in place for now   Visit Diagnosis 1. Encounter for follow-up surveillance of diffuse large B-cell lymphoma      Dr. Randa Evens, MD, MPH [facility] at [facility] [phone] 03/18/2021 1:23 PM

## 2021-03-26 ENCOUNTER — Other Ambulatory Visit: Payer: Medicaid Other

## 2021-03-26 NOTE — Progress Notes (Signed)
Tumor Board Documentation  Christopher Mills was presented by Dr Janese Banks at our Tumor Board on 03/26/2021, which included representatives from medical oncology, radiation oncology, pathology, radiology, genetics, pulmonology, palliative care, internal medicine, navigation, research.  Christopher Mills currently presents as a current patient, for discussion with history of the following treatments: neoadjuvant chemotherapy.  Additionally, we reviewed previous medical and familial history, history of present illness, and recent lab results along with all available histopathologic and imaging studies. The tumor board considered available treatment options and made the following recommendations: Active surveillance    The following procedures/referrals were also placed: No orders of the defined types were placed in this encounter.   Clinical Trial Status: not discussed   Staging used: AJCC Stage Group AJCC Staging:       Group: High Grade Triple HIt B Cell Lymphoma   National site-specific guidelines NCCN were discussed with respect to the case.  Tumor board is a meeting of clinicians from various specialty areas who evaluate and discuss patients for whom a multidisciplinary approach is being considered. Final determinations in the plan of care are those of the provider(s). The responsibility for follow up of recommendations given during tumor board is that of the provider.   Todays extended care, comprehensive team conference, Christopher Mills was not present for the discussion and was not examined.   Multidisciplinary Tumor Board is a multidisciplinary case peer review process.  Decisions discussed in the Multidisciplinary Tumor Board reflect the opinions of the specialists present at the conference without having examined the patient.  Ultimately, treatment and diagnostic decisions rest with the primary provider(s) and the patient.

## 2021-06-23 ENCOUNTER — Inpatient Hospital Stay: Payer: Self-pay | Attending: Oncology

## 2021-06-23 ENCOUNTER — Inpatient Hospital Stay (HOSPITAL_BASED_OUTPATIENT_CLINIC_OR_DEPARTMENT_OTHER): Payer: Medicaid Other | Admitting: Oncology

## 2021-06-23 ENCOUNTER — Encounter: Payer: Self-pay | Admitting: Oncology

## 2021-06-23 VITALS — BP 138/74 | HR 70 | Temp 97.8°F | Resp 18 | Wt 192.7 lb

## 2021-06-23 DIAGNOSIS — Z08 Encounter for follow-up examination after completed treatment for malignant neoplasm: Secondary | ICD-10-CM

## 2021-06-23 DIAGNOSIS — D709 Neutropenia, unspecified: Secondary | ICD-10-CM | POA: Insufficient documentation

## 2021-06-23 DIAGNOSIS — Z809 Family history of malignant neoplasm, unspecified: Secondary | ICD-10-CM | POA: Insufficient documentation

## 2021-06-23 DIAGNOSIS — Z79899 Other long term (current) drug therapy: Secondary | ICD-10-CM | POA: Insufficient documentation

## 2021-06-23 DIAGNOSIS — Z832 Family history of diseases of the blood and blood-forming organs and certain disorders involving the immune mechanism: Secondary | ICD-10-CM | POA: Insufficient documentation

## 2021-06-23 DIAGNOSIS — Z95828 Presence of other vascular implants and grafts: Secondary | ICD-10-CM

## 2021-06-23 DIAGNOSIS — Z8579 Personal history of other malignant neoplasms of lymphoid, hematopoietic and related tissues: Secondary | ICD-10-CM

## 2021-06-23 DIAGNOSIS — R590 Localized enlarged lymph nodes: Secondary | ICD-10-CM | POA: Insufficient documentation

## 2021-06-23 DIAGNOSIS — Z8269 Family history of other diseases of the musculoskeletal system and connective tissue: Secondary | ICD-10-CM | POA: Insufficient documentation

## 2021-06-23 DIAGNOSIS — C8330 Diffuse large B-cell lymphoma, unspecified site: Secondary | ICD-10-CM | POA: Insufficient documentation

## 2021-06-23 DIAGNOSIS — Z8349 Family history of other endocrine, nutritional and metabolic diseases: Secondary | ICD-10-CM | POA: Insufficient documentation

## 2021-06-23 DIAGNOSIS — R7402 Elevation of levels of lactic acid dehydrogenase (LDH): Secondary | ICD-10-CM | POA: Insufficient documentation

## 2021-06-23 DIAGNOSIS — Z8249 Family history of ischemic heart disease and other diseases of the circulatory system: Secondary | ICD-10-CM | POA: Insufficient documentation

## 2021-06-23 LAB — CBC WITH DIFFERENTIAL/PLATELET
Abs Immature Granulocytes: 0.01 10*3/uL (ref 0.00–0.07)
Basophils Absolute: 0 10*3/uL (ref 0.0–0.1)
Basophils Relative: 0 %
Eosinophils Absolute: 0 10*3/uL (ref 0.0–0.5)
Eosinophils Relative: 1 %
HCT: 36.6 % — ABNORMAL LOW (ref 39.0–52.0)
Hemoglobin: 12.7 g/dL — ABNORMAL LOW (ref 13.0–17.0)
Immature Granulocytes: 0 %
Lymphocytes Relative: 49 %
Lymphs Abs: 2.2 10*3/uL (ref 0.7–4.0)
MCH: 31.5 pg (ref 26.0–34.0)
MCHC: 34.7 g/dL (ref 30.0–36.0)
MCV: 90.8 fL (ref 80.0–100.0)
Monocytes Absolute: 0.5 10*3/uL (ref 0.1–1.0)
Monocytes Relative: 10 %
Neutro Abs: 1.8 10*3/uL (ref 1.7–7.7)
Neutrophils Relative %: 40 %
Platelets: 214 10*3/uL (ref 150–400)
RBC: 4.03 MIL/uL — ABNORMAL LOW (ref 4.22–5.81)
RDW: 14.6 % (ref 11.5–15.5)
WBC: 4.5 10*3/uL (ref 4.0–10.5)
nRBC: 0 % (ref 0.0–0.2)

## 2021-06-23 LAB — COMPREHENSIVE METABOLIC PANEL
ALT: 23 U/L (ref 0–44)
AST: 28 U/L (ref 15–41)
Albumin: 4.1 g/dL (ref 3.5–5.0)
Alkaline Phosphatase: 84 U/L (ref 38–126)
Anion gap: 6 (ref 5–15)
BUN: 13 mg/dL (ref 8–23)
CO2: 27 mmol/L (ref 22–32)
Calcium: 9.1 mg/dL (ref 8.9–10.3)
Chloride: 105 mmol/L (ref 98–111)
Creatinine, Ser: 1.02 mg/dL (ref 0.61–1.24)
GFR, Estimated: >60 mL/min (ref >60)
Glucose, Bld: 87 mg/dL (ref 70–99)
Potassium: 3.7 mmol/L (ref 3.5–5.1)
Sodium: 138 mmol/L (ref 135–145)
Total Bilirubin: 0.4 mg/dL (ref 0.3–1.2)
Total Protein: 6.9 g/dL (ref 6.5–8.1)

## 2021-06-23 LAB — LACTATE DEHYDROGENASE: LDH: 134 U/L (ref 98–192)

## 2021-06-23 MED ORDER — HEPARIN SOD (PORK) LOCK FLUSH 100 UNIT/ML IV SOLN
500.0000 [IU] | Freq: Once | INTRAVENOUS | Status: AC
  Administered 2021-06-23: 500 [IU] via INTRAVENOUS
  Filled 2021-06-23: qty 5

## 2021-06-23 MED ORDER — SODIUM CHLORIDE 0.9% FLUSH
10.0000 mL | INTRAVENOUS | Status: DC | PRN
  Administered 2021-06-23: 10 mL via INTRAVENOUS
  Filled 2021-06-23: qty 10

## 2021-06-23 NOTE — Progress Notes (Signed)
Survivorship Care Plan visit completed.  Treatment summary reviewed and given to patient.  ASCO answers booklet reviewed and given to patient.  CARE program and Cancer Transitions discussed with patient along with other resources cancer center offers to patients and caregivers.  Patient verbalized understanding.    

## 2021-06-24 ENCOUNTER — Other Ambulatory Visit: Payer: Self-pay | Admitting: Oncology

## 2021-06-24 ENCOUNTER — Other Ambulatory Visit: Payer: Self-pay | Admitting: *Deleted

## 2021-06-24 DIAGNOSIS — R590 Localized enlarged lymph nodes: Secondary | ICD-10-CM

## 2021-06-24 DIAGNOSIS — C8510 Unspecified B-cell lymphoma, unspecified site: Secondary | ICD-10-CM

## 2021-06-27 ENCOUNTER — Encounter: Payer: Self-pay | Admitting: Oncology

## 2021-06-27 NOTE — Progress Notes (Signed)
? ? ? ?Hematology/Oncology Consult note ?[place]  ?Telephone:(336) [phone] [phone] ? ?Patient Care Team: ?[name], MD as PCP - General (Internal Medicine) ?Sindy Guadeloupe, MD as Consulting Physician (Hematology and Oncology) ?Jules Husbands, MD as Consulting Physician (General Surgery)  ? ?Name of the patient: Christopher Mills  ?[id number]  ?[birth year]  ? ?Date of visit: 06/27/21 ? 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 ?Chief complaint/ Reason for visit- routine f/u of DLBCL ? ?Heme/Onc history: Patient is a 61 year old male who underwent CT chest for symptoms of exertional shortness of breath which showed a right paratracheal mass 6.4 x 4.7 cm along with lymphadenopathy in the upper abdomen concerning for Lymphoma.  This was followed by a PET CT scan which showed extensive FDG avid adenopathy in the neck chest abdomen and pelvis.  FDG avid splenic lesions.  Solitary intramuscular FDG avid lesion in the right biceps femoris muscle. ?  ?Supraclavicular excisional lymph node biopsy showed high-grade B-cell lymphoma germinal center type Ki-67 greater than 95%.  FISH testing was positive for Bcl-2 BCL6 and MYC consistent with triple hit lymphoma.  By NCCN IPI score would be 4 based on age and elevated LDH and stage IV (intramuscular biceps femoris lesion) which puts him in the high intermediate risk group. CNS IPI score 4 ?  ?  ?Bone marrow biopsy showed involvement with low-grade B-cell lymphoproliferative disorder with no evidence of High-grade B-cell lymphoma. ?  ?Patient received RCHOP for cycle 1 with plans for DA [facility] with cycle 2. IT MTX for CNS prophylaxis and he has received 3 cycles but declined further cycles.  MRI brain negative for lymphoma.  Chemotherapy was not being able to escalated upwards due to neutropenia which has persisted to 3 weeks and he has been receiving treatment every 4 weeks ?  ?PET CT scan after 3 cycles of chemotherapy  showedComplete or near complete metabolic response.  Residual disease in the anterior upper right mediastinum slightly greater than background mediastinal activity ? ?Interval history- reports doing well. Denies any complaints at this time. Appetite and weight remains stable ? ?ECOG PS- 0 ?Pain scale- 0 ? ? ?Review of systems- Review of Systems  ?Constitutional:  Negative for chills, fever, malaise/fatigue and weight loss.  ?HENT:  Negative for congestion, ear discharge and nosebleeds.   ?Eyes:  Negative for blurred vision.  ?Respiratory:  Negative for cough, hemoptysis, sputum production, shortness of breath and wheezing.   ?Cardiovascular:  Negative for chest pain, palpitations, orthopnea and claudication.  ?Gastrointestinal:  Negative for abdominal pain, blood in stool, constipation, diarrhea, heartburn, melena, nausea and vomiting.  ?Genitourinary:  Negative for dysuria, flank pain, frequency, hematuria and urgency.  ?Musculoskeletal:  Negative for back pain, joint pain and myalgias.  ?Skin:  Negative for rash.  ?Neurological:  Negative for dizziness, tingling, focal weakness, seizures, weakness and headaches.  ?Endo/Heme/Allergies:  Does not bruise/bleed easily.  ?Psychiatric/Behavioral:  Negative for depression and suicidal ideas. The patient does not have insomnia.    ? ? ? ?No Known Allergies ? ? ?Past Medical History:  ?Diagnosis Date  ? Acute kidney injury ([place]) 07/12/2020  ? Dyspnea   ? Hypertension   ? 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 ? ? ?Past Surgical History:  ?Procedure Laterality Date  ? BONE MARROW BIOPSY    ? COLONOSCOPY    ? EXCISION MASS NECK Right 06/12/2020  ? Procedure: EXCISION MASS NECK;  Surgeon: Jules Husbands, MD;  Location: [facility] ORS;  Service: General;  Laterality: Right;  ? HERNIA REPAIR Right   ? at age 60-RIH  ? PORTA CATH INSERTION    ? PORTACATH PLACEMENT Right 06/24/2020  ? Procedure: INSERTION PORT-A-CATH;  Surgeon: Jules Husbands, MD;  Location: [facility] ORS;  Service:  General;  Laterality: Right;  ? ? ?Social History  ? ?Socioeconomic History  ? Marital status: Single  ?  Spouse name: Not on file  ? Number of children: Not on file  ? Years of education: Not on file  ? Highest education level: Not on file  ?Occupational History  ? Not on file  ?Tobacco Use  ? Smoking status: Former  ?  Types: Cigarettes  ?  Quit date: 06/13/2020  ?  Years since quitting: 1.0  ? Smokeless tobacco: Never  ?Vaping Use  ? Vaping Use: Never used  ?Substance and Sexual Activity  ? Alcohol use: Not Currently  ? Drug use: Not Currently  ?  Types: Marijuana  ? Sexual activity: Not Currently  ?Other Topics Concern  ? Not on file  ?Social History Narrative  ? Has daughter and grandson in the home  ? ?Social Determinants of Health  ? ?Financial Resource Strain: Not on file  ?Food Insecurity: Not on file  ?Transportation Needs: Not on file  ?Physical Activity: Not on file  ?Stress: Not on file  ?Social Connections: Not on file  ?Intimate Partner Violence: Not on file  ? ? ?Family History  ?Problem Relation Age of Onset  ? Anemia Mother   ? Hypertension Mother   ? Goiter Mother   ? Cancer Father   ? Cancer Sister   ? Multiple sclerosis Sister   ? ? ? ?Current Outpatient Medications:  ?  allopurinol (ZYLOPRIM) 300 MG tablet, Take 1 tablet (300 mg total) by mouth daily., Disp: 30 tablet, Rfl: 1 ?  atenolol (TENORMIN) 25 MG tablet, Take 25 mg by mouth daily., Disp: , Rfl:  ?  cholecalciferol (VITAMIN D3) 25 MCG (1000 UNIT) tablet, Take 1,000 Units by mouth daily., Disp: , Rfl:  ?  lisinopril (ZESTRIL) 10 MG tablet, Take 10 mg by mouth daily., Disp: , Rfl:  ?  Omega-3 Fatty Acids (FISH OIL ADULT GUMMIES PO), Take by mouth., Disp: , Rfl:  ?  vitamin B-12 (CYANOCOBALAMIN) 100 MCG tablet, Take 100 mcg by mouth daily., Disp: , Rfl:  ?  vitamin C (ASCORBIC ACID) 500 MG tablet, Take 500 mg by mouth daily., Disp: , Rfl:  ?  VITAMIN D PO, Take 1 capsule by mouth daily., Disp: , Rfl:  ?  HYDROcodone-acetaminophen  (NORCO/VICODIN) 5-325 MG tablet, Take 1 tablet by mouth every 6 (six) hours as needed for moderate pain. (Patient not taking: Reported on 09/08/2020), Disp: 15 tablet, Rfl: 0 ?No current facility-administered medications for this visit. ? ?Facility-Administered Medications Ordered in Other Visits:  ?  0.9 %  sodium chloride infusion, , Intravenous, Continuous, Sindy Guadeloupe, MD, Stopped at 08/12/20 1310 ?  0.9 %  sodium chloride infusion, , Intravenous, Continuous, Sindy Guadeloupe, MD, Stopped at 09/08/20 [id number] ?  0.9 %  sodium chloride infusion, , Intravenous, Continuous, Sindy Guadeloupe, MD, Stopped at 09/09/20 1507 ?  0.9 %  sodium chloride infusion, , Intravenous, Continuous, Cammie Sickle, MD, Stopped at 09/11/20 1502 ?  0.9 %  sodium chloride infusion, , Intravenous, Continuous, Sindy Guadeloupe, MD, Stopped at 10/06/20 1330 ?  0.9 %  sodium chloride infusion, , Intravenous, Continuous, Sindy Guadeloupe,  MD, Stopped at 11/03/20 1225 ?  0.9 %  sodium chloride infusion, , Intravenous, Continuous, Sindy Guadeloupe, MD, Stopped at 11/06/20 1424 ?  0.9 %  sodium chloride infusion, , Intravenous, Continuous, Sindy Guadeloupe, MD, Stopped at 12/01/20 1213 ?  heparin lock flush 100 unit/mL, 500 Units, Intravenous, Once, Sindy Guadeloupe, MD ?  heparin lock flush 100 unit/mL, 500 Units, Intracatheter, Once PRN, Sindy Guadeloupe, MD ?  methotrexate (PF) 12 mg in sodium chloride (PF) 0.9 % INTRATHECAL chemo injection, , Intrathecal, Once, Sindy Guadeloupe, MD ?  sodium chloride flush (NS) 0.9 % injection 10 mL, 10 mL, Intravenous, PRN, Sindy Guadeloupe, MD, 10 mL at 08/13/20 1510 ?  sodium chloride flush (NS) 0.9 % injection 10 mL, 10 mL, Intravenous, PRN, Sindy Guadeloupe, MD, 10 mL at 11/04/20 1408 ?  sodium chloride flush (NS) 0.9 % injection 10 mL, 10 mL, Intracatheter, PRN, Sindy Guadeloupe, MD ?  sodium chloride flush (NS) 0.9 % injection 10 mL, 10 mL, Intravenous, PRN, Sindy Guadeloupe, MD, 10 mL at 12/02/20 1505 ? ?Physical exam:   ?Vitals:  ? 06/23/21 1436  ?BP: 138/74  ?Pulse: 70  ?Resp: 18  ?Temp: 97.8 ?F (36.6 ?C)  ?SpO2: 100%  ?Weight: 192 lb 11.2 oz (87.4 kg)  ? ?Physical Exam ?Constitutional:   ?   General: He is not in acute distress. ?

## 2021-06-28 ENCOUNTER — Other Ambulatory Visit: Payer: Self-pay | Admitting: *Deleted

## 2021-06-28 DIAGNOSIS — R590 Localized enlarged lymph nodes: Secondary | ICD-10-CM

## 2021-06-28 DIAGNOSIS — C8510 Unspecified B-cell lymphoma, unspecified site: Secondary | ICD-10-CM

## 2021-06-29 ENCOUNTER — Telehealth: Payer: Self-pay | Admitting: *Deleted

## 2021-06-29 ENCOUNTER — Other Ambulatory Visit: Payer: Self-pay | Admitting: *Deleted

## 2021-06-29 DIAGNOSIS — R590 Localized enlarged lymph nodes: Secondary | ICD-10-CM

## 2021-06-29 DIAGNOSIS — C8510 Unspecified B-cell lymphoma, unspecified site: Secondary | ICD-10-CM

## 2021-06-29 NOTE — Telephone Encounter (Signed)
Called pt and let him know that IR folks wants an u/s before they will determine if spot needs bx. We have scheduled  [place] for 4/28 at [facility]pm [facility]. He is to arrive 15 min. And he is ok with this appt and he can see it in my chart ?

## 2021-07-06 ENCOUNTER — Encounter: Payer: Self-pay | Admitting: Oncology

## 2021-07-10 ENCOUNTER — Ambulatory Visit
Admission: RE | Admit: 2021-07-10 | Discharge: 2021-07-10 | Disposition: A | Discharge status: Home / Self Care | Payer: Self-pay | Source: Ambulatory Visit | Attending: Oncology | Admitting: Oncology

## 2021-07-10 DIAGNOSIS — R590 Localized enlarged lymph nodes: Secondary | ICD-10-CM | POA: Insufficient documentation

## 2021-07-10 DIAGNOSIS — C8510 Unspecified B-cell lymphoma, unspecified site: Secondary | ICD-10-CM | POA: Insufficient documentation

## 2021-07-10 IMAGING — US US SOFT TISSUE HEAD/NECK
1 series · 9 of 9 positions shown · non-contrast
Comparison: PET-CT [DATE].

CLINICAL DATA: Provided history: High-grade B-cell lymphoma,
supraclavicular lymphadenopathy. Palpable supraclavicular nodule on
right side.

EXAM:
ULTRASOUND OF HEAD/NECK SOFT TISSUES
TECHNIQUE: Ultrasound examination of the neck soft tissues was performed in the
area of clinical concern.

[Series 1: us soft tissue head & neck (non-thyroid) · 9 acquisitions, 9 frames shown]
[im 1/9]
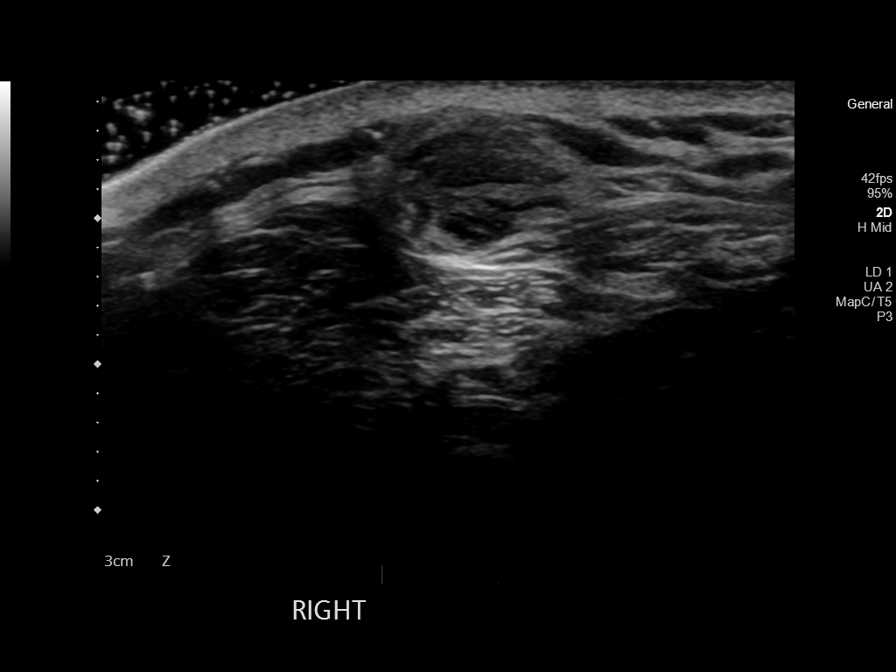
[im 2/9]
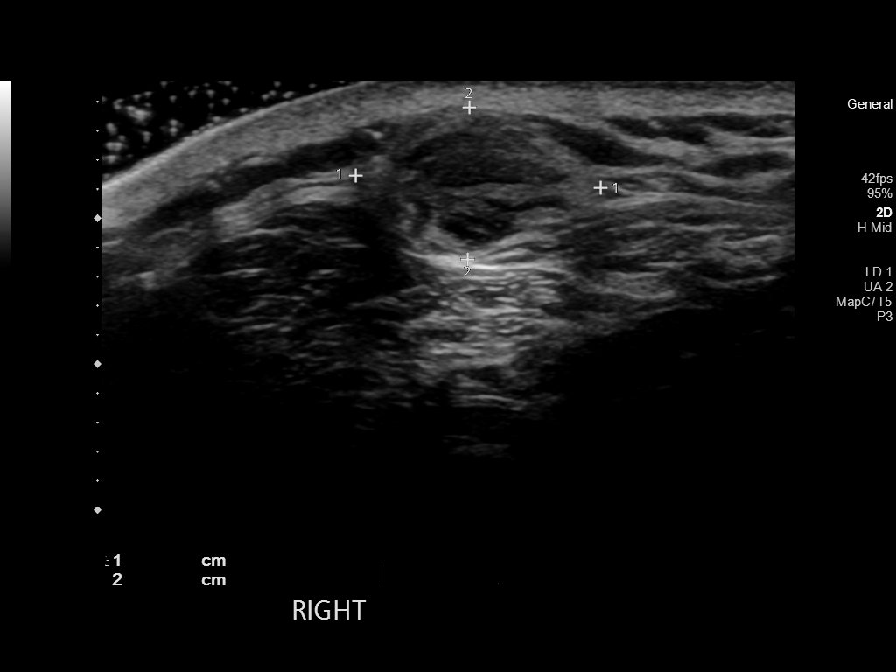
[im 3/9]
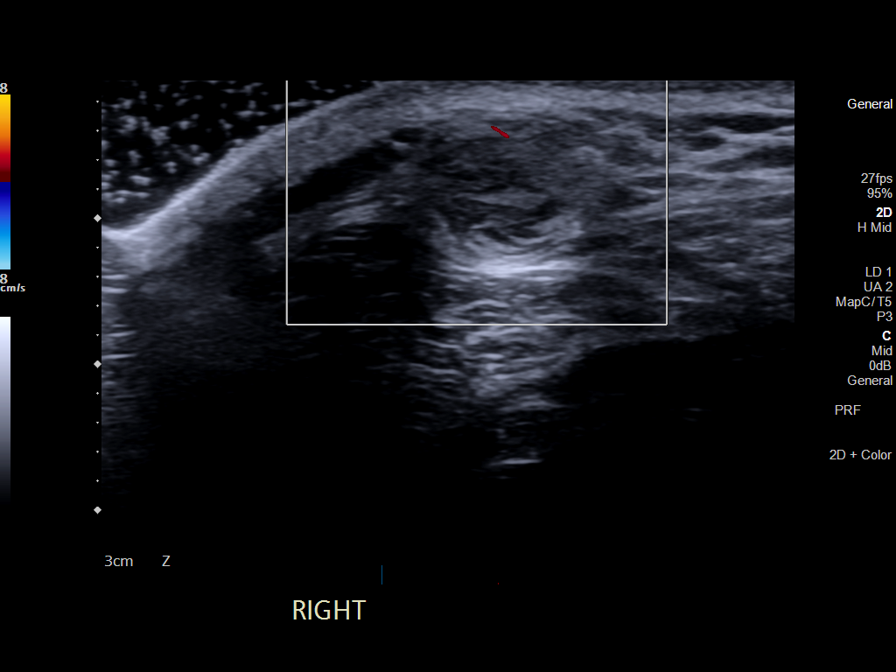
[im 4/9]
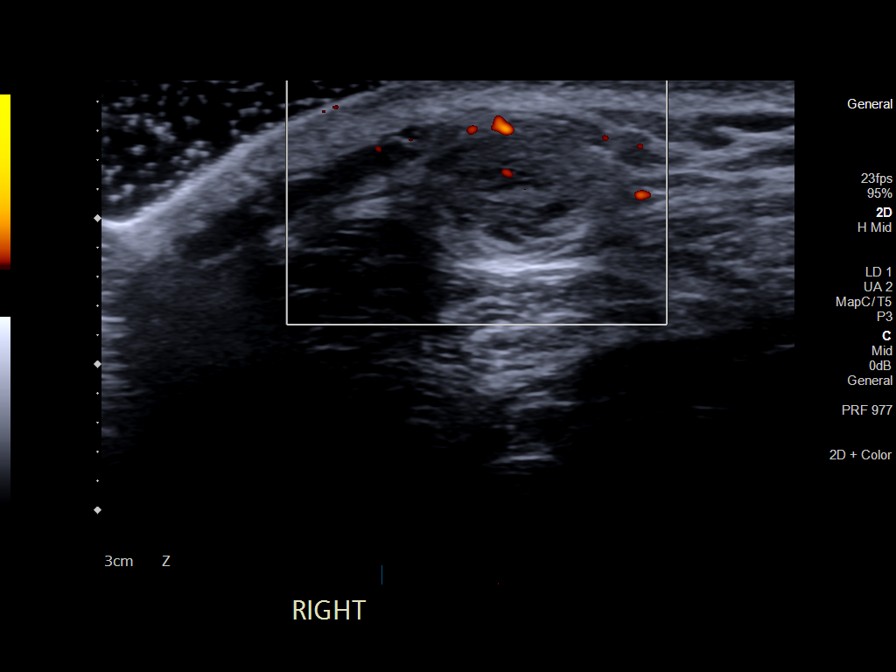
[im 5/9]
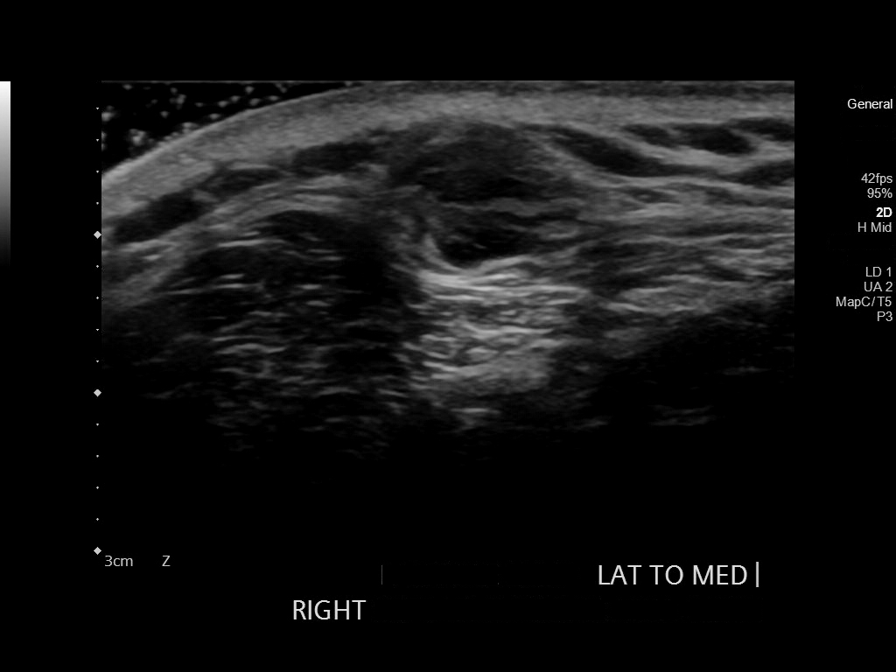
[im 6/9]
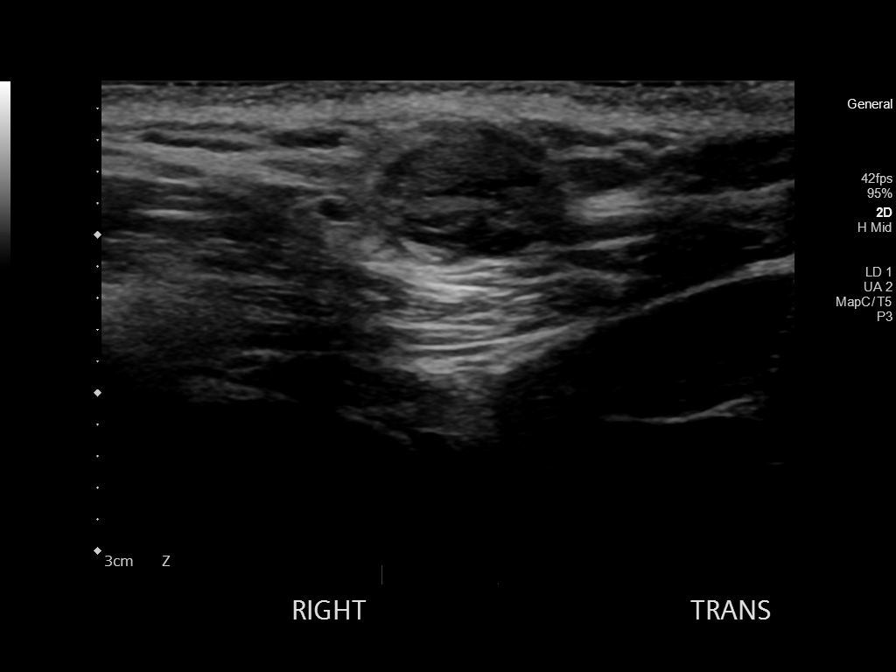
[im 7/9]
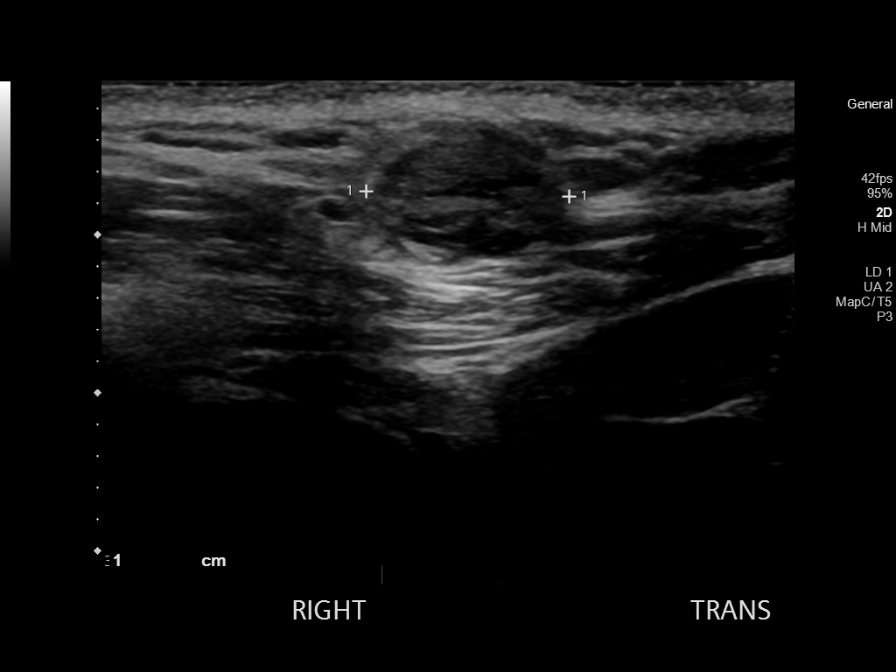
[im 8/9]
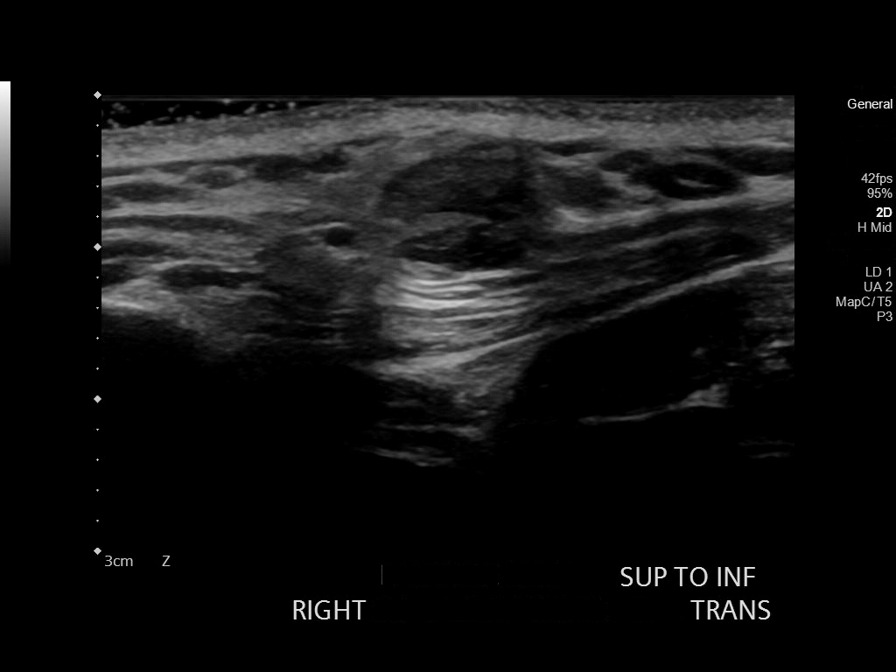
[im 9/9]
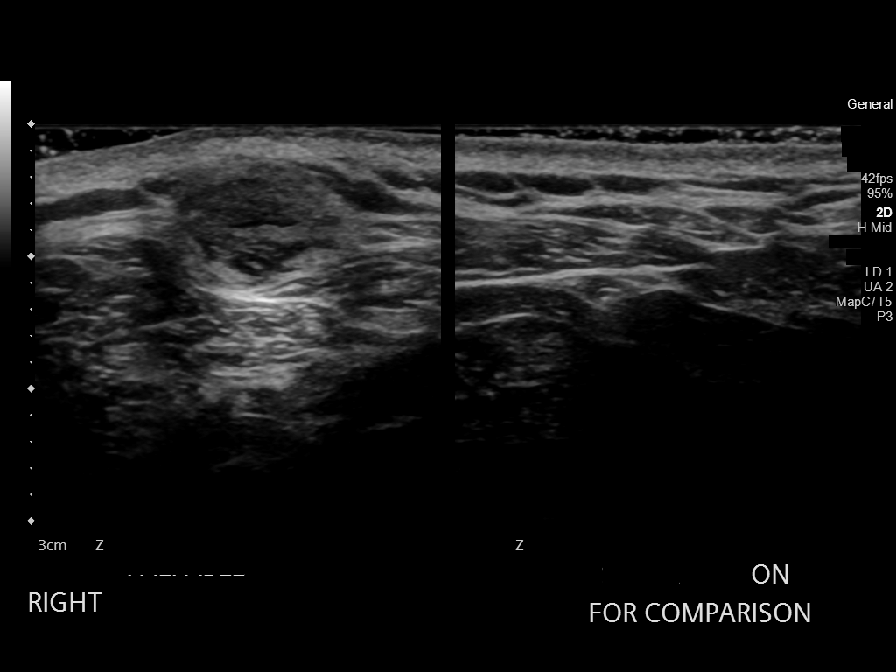

[9 of 9 positions shown; findings below may reference images not displayed]

FINDINGS: Targeted ultrasound was performed in the right lower neck/right
supraclavicular region of concern. At this site, there is a
heterogeneous round-to-ovoid subcutaneous soft tissue focus
measuring 1.7 x 1.0 x 1.3 cm. There is associated color Doppler
flow.

These results will be called to the ordering clinician or
representative by the Radiologist Assistant, and communication
documented in the PACS or [REDACTED].
IMPRESSION: 1.7 x 1.0 x 1.3 cm heterogeneous round-to-ovoid subcutaneous soft
tissue focus within the right supraclavicular region of concern.
Although nonspecific, this could reflect a mass or abnormal lymph
node and a contrast-enhanced neck CT is recommended for further
evaluation. Lipoma is also a consideration, although no lipoma was
present at this site on the prior PET-CT of [DATE].

## 2021-07-13 ENCOUNTER — Encounter: Payer: Self-pay | Admitting: Oncology

## 2021-07-13 ENCOUNTER — Telehealth: Payer: Self-pay | Admitting: *Deleted

## 2021-07-13 NOTE — Telephone Encounter (Signed)
Pt called and wanted to know that the results are. It was a call to Janese Banks and it look  like maybe fatty tissue, we are sending the IR MD to review this and sees what they think of the 1.7 cm. When Ir tells [place] what they think it could be bx or it could be pet scan . I will let pt know when we get the info from IR. Pt is ok with this ?

## 2021-07-15 ENCOUNTER — Telehealth: Payer: Self-pay | Admitting: *Deleted

## 2021-07-15 NOTE — Telephone Encounter (Signed)
Called pt and let him know that his biopsy has been scheduled for 5 5 which is Friday.  [facility] medical mall and register at the admissions desk and then they will do the biopsy.  Nothing to eat or drink 8 hours prior to the biopsy.  Have to have a driver because they give you medicine to make you sleepy.  Arrival at the medical mall at 1230 for 1:00 biopsy.  Patient says he has someone to drive him and he will be there at that date and time ?

## 2021-07-16 ENCOUNTER — Encounter: Payer: Self-pay | Admitting: Oncology

## 2021-07-16 NOTE — Progress Notes (Signed)
Patient is set up to have that biopsy on May 5, patient aware of the appointment and will be there ?

## 2021-07-17 ENCOUNTER — Ambulatory Visit
Admission: RE | Admit: 2021-07-17 | Discharge: 2021-07-17 | Disposition: A | Discharge status: Home / Self Care | Payer: Self-pay | Source: Ambulatory Visit | Attending: Oncology | Admitting: Oncology

## 2021-07-17 ENCOUNTER — Other Ambulatory Visit: Payer: Self-pay | Admitting: Oncology

## 2021-07-17 DIAGNOSIS — C8510 Unspecified B-cell lymphoma, unspecified site: Secondary | ICD-10-CM

## 2021-07-17 DIAGNOSIS — R590 Localized enlarged lymph nodes: Secondary | ICD-10-CM

## 2021-07-17 IMAGING — US IR BIOPSY CORE MUSCLE/SOFT TISSUE
1 series · 11 of 11 positions shown · non-contrast
Comparison: none

INDICATION: 60-year-old gentleman with history of lymphoma presents to IR for
biopsy of enlarging right neck mass.

[Series 1: us fna biopsy soft tissue 1st lesion · 11 of 11 slices shown]
[im 1/11]
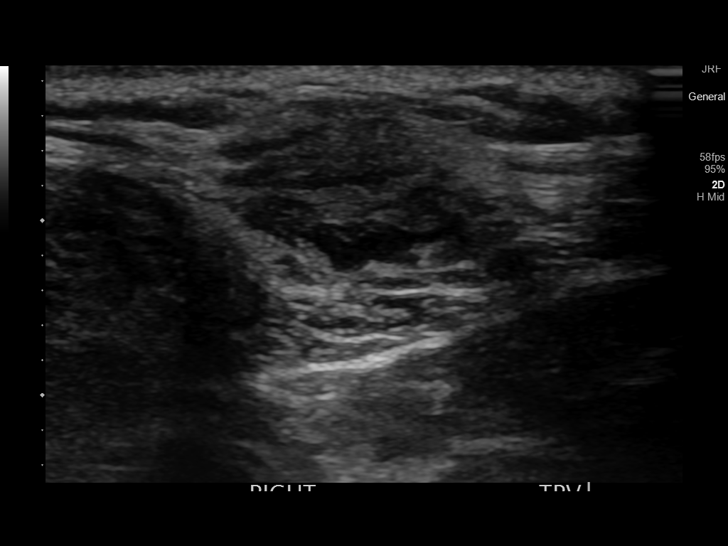
[im 2/11]
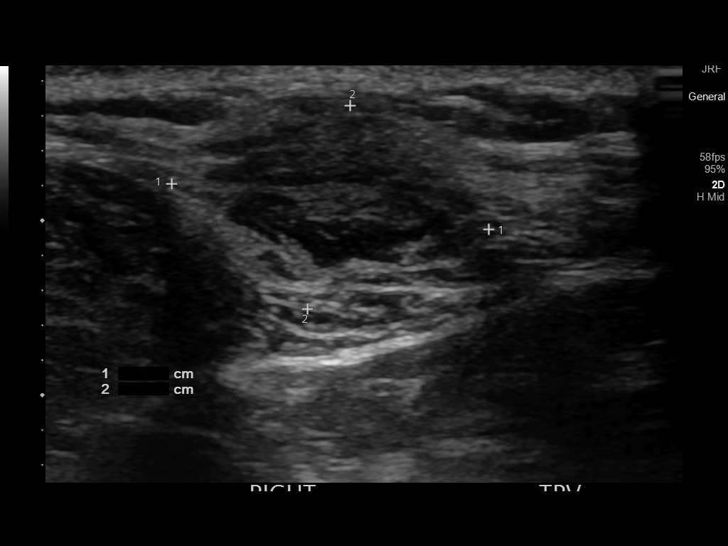
[im 3/11]
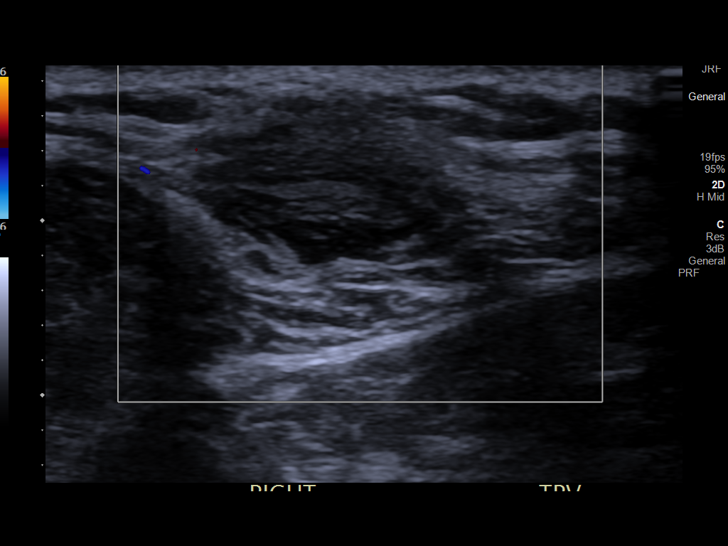
[im 4/11]
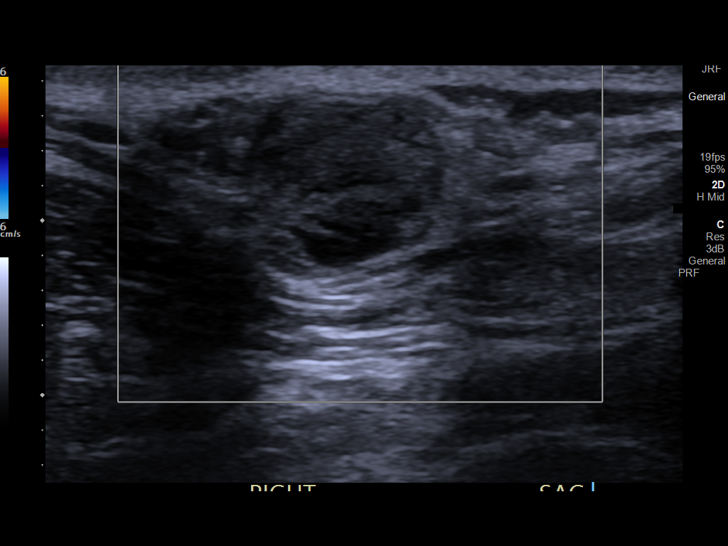
[im 5/11]
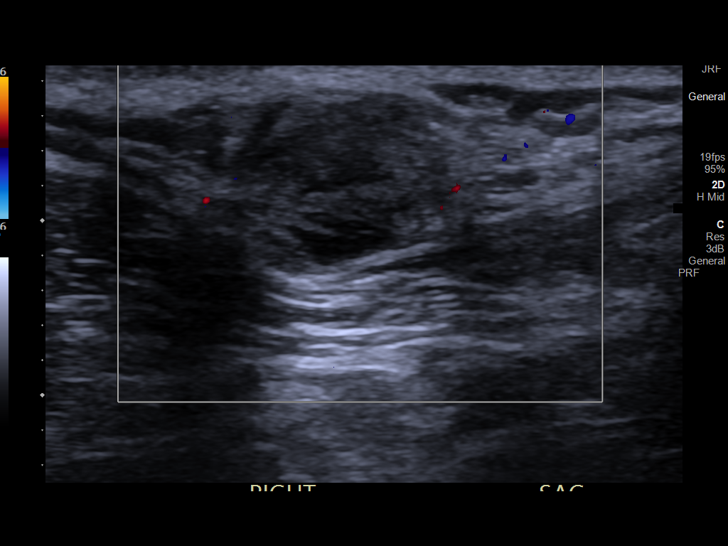
[im 6/11]
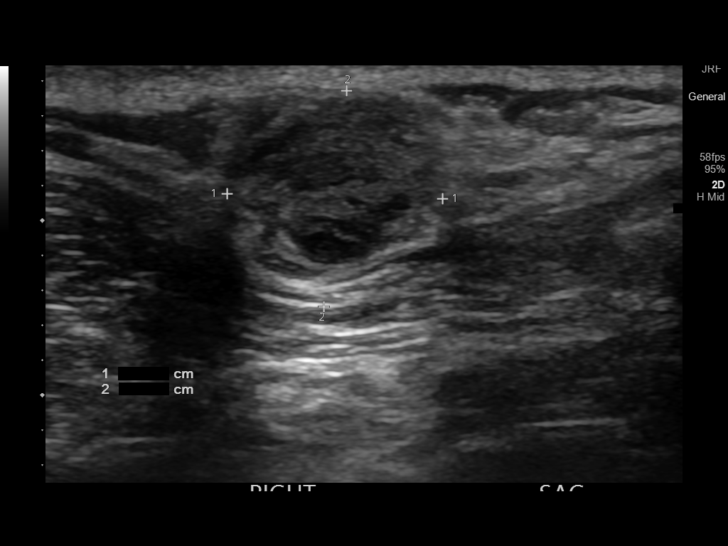
[im 7/11]
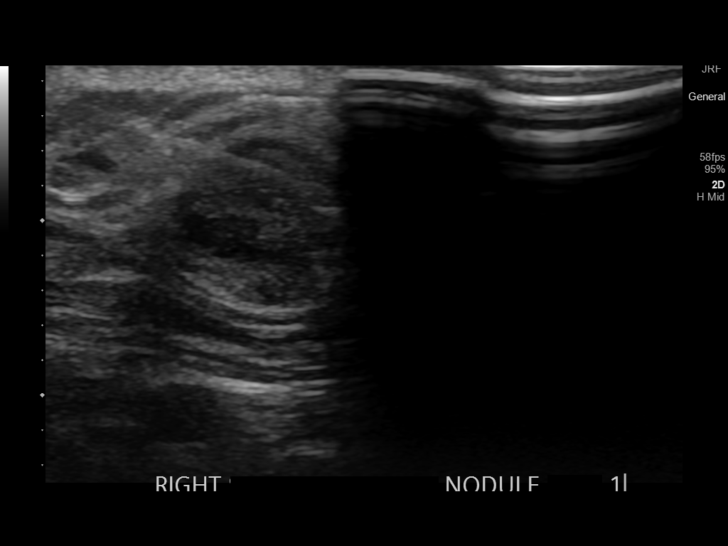
[im 8/11]
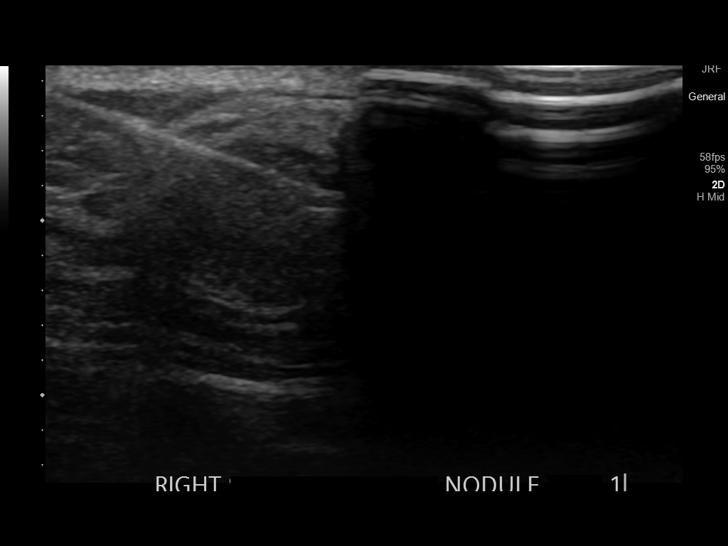
[im 9/11]
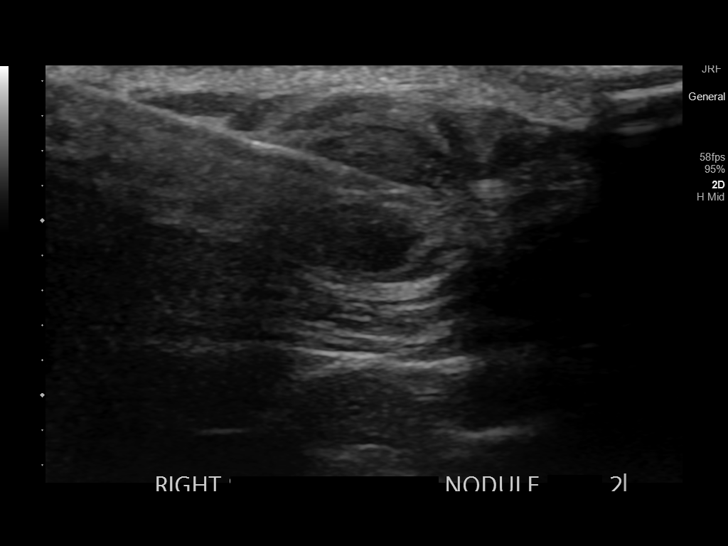
[im 10/11]
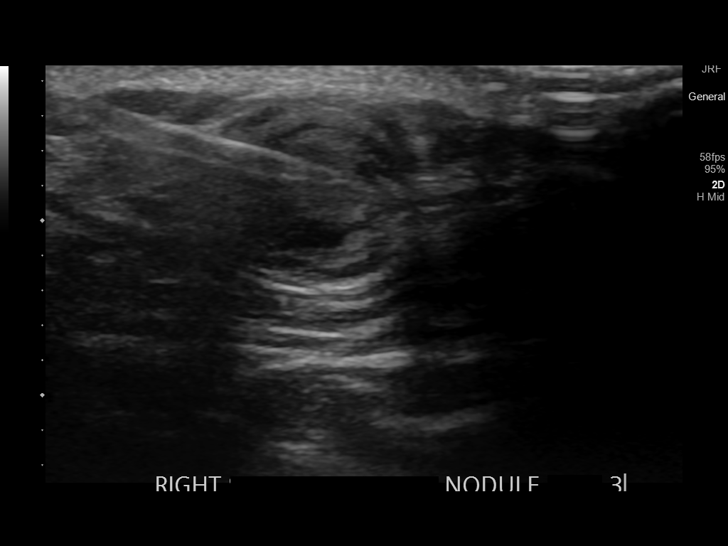
[im 11/11]
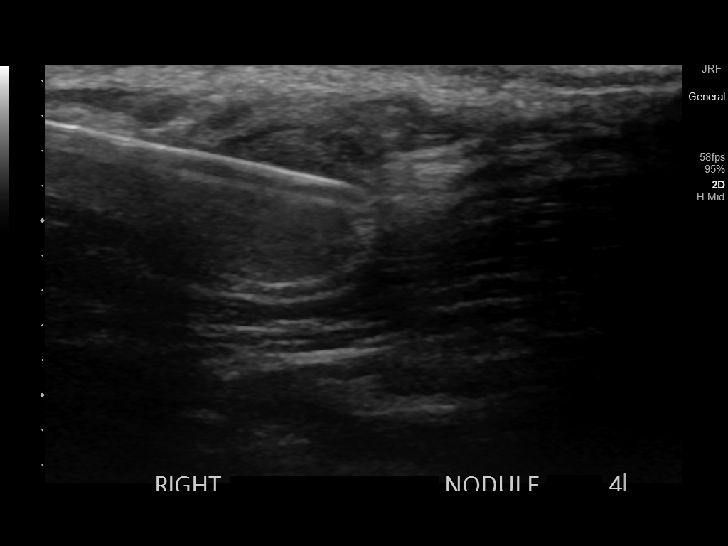

[11 of 11 positions shown; findings below may reference images not displayed]

EXAM:
Ultrasound-guided biopsy of right neck mass

MEDICATIONS:
None.

ANESTHESIA/SEDATION:
None

COMPLICATIONS:
None immediate.

PROCEDURE:
Informed written consent was obtained from the patient after a
thorough discussion of the procedural risks, benefits and
alternatives. All questions were addressed. Maximal Sterile Barrier
Technique was utilized including caps, mask, sterile gowns, sterile
gloves, sterile drape, hand hygiene and skin antiseptic. A timeout
was performed prior to the initiation of the procedure.

Patient position left lateral decubitus on the ultrasound table.

Right neck skin prepped and draped in usual sterile fashion.

Following local lidocaine administration, 4-18 gauge cores were
obtained from the right neck mass utilizing continuous ultrasound
guidance.

Samples were sent to pathology in sterile saline.

Needle removed and hemostasis achieved with 2 minutes of manual
compression.

Post procedure ultrasound images showed no evidence of significant
hemorrhage.
IMPRESSION: Ultrasound-guided biopsy of enlarging right neck mass.

## 2021-07-17 NOTE — Procedures (Signed)
Interventional Radiology Procedure Note ? ?Procedure: US guided biopsy of right neck mass ? ?Indication: Enlarging right neck mass ? ?Findings: Please refer to procedural dictation for full description. ? ?Complications: None ? ?EBL: < 10 mL ? ?Miachel Roux, MD ?[phone] ? ? ?

## 2021-07-21 ENCOUNTER — Telehealth: Payer: Self-pay | Admitting: *Deleted

## 2021-07-21 ENCOUNTER — Other Ambulatory Visit: Payer: Self-pay

## 2021-07-21 ENCOUNTER — Encounter: Payer: Self-pay | Admitting: Oncology

## 2021-07-21 DIAGNOSIS — C8590 Non-Hodgkin lymphoma, unspecified, unspecified site: Secondary | ICD-10-CM

## 2021-07-21 LAB — SURGICAL PATHOLOGY

## 2021-07-21 NOTE — Telephone Encounter (Signed)
Called patient and let him know that the pathology did come back that he is got lymphoma again.  Dr. Janese Banks wants him to have a PET scan as soon as possible and then 1 was scheduled for May 15 at 1030.  To come 15 to 20 minutes sooner than the appointment time at the medical mall at [facility].  We will schedule the MRI of the brain tomorrow and let him know the date and time.  Also go over all the instructions for both of them once we get the MRI scheduled.  Patient agreeable to the plan. ?

## 2021-07-22 ENCOUNTER — Other Ambulatory Visit: Payer: Self-pay | Admitting: *Deleted

## 2021-07-23 ENCOUNTER — Other Ambulatory Visit: Payer: Self-pay | Admitting: *Deleted

## 2021-07-23 ENCOUNTER — Encounter: Payer: Self-pay | Admitting: Oncology

## 2021-07-23 ENCOUNTER — Other Ambulatory Visit: Payer: Self-pay

## 2021-07-23 DIAGNOSIS — C8590 Non-Hodgkin lymphoma, unspecified, unspecified site: Secondary | ICD-10-CM

## 2021-07-23 DIAGNOSIS — C8510 Unspecified B-cell lymphoma, unspecified site: Secondary | ICD-10-CM

## 2021-07-23 NOTE — Telephone Encounter (Signed)
Called pt back and said that dr Janese Banks will see him on 5/17 2:45 for labs and then see dr. Janese Banks. Asked him to come 15 min to check in. He is good with the appts ?

## 2021-07-25 ENCOUNTER — Ambulatory Visit
Admission: RE | Admit: 2021-07-25 | Discharge: 2021-07-25 | Disposition: A | Discharge status: Home / Self Care | Payer: Self-pay | Source: Ambulatory Visit | Attending: Oncology | Admitting: Oncology

## 2021-07-25 DIAGNOSIS — C8590 Non-Hodgkin lymphoma, unspecified, unspecified site: Secondary | ICD-10-CM | POA: Insufficient documentation

## 2021-07-25 IMAGING — MR MR HEAD WO/W CM
16 series · 48 of 48 positions shown · IV contrast (gadavist)
Comparison: None Available.

CLINICAL DATA: Lymphoma

EXAM:
MRI HEAD WITHOUT AND WITH CONTRAST
TECHNIQUE: Multiplanar, multiecho pulse sequences of the brain and surrounding
structures were obtained without and with intravenous contrast.
CONTRAST:  7.5mL GADAVIST GADOBUTROL 1 MMOL/ML IV SOLN

[Series 5: ax dwi_tracew · axial · 3.0mm · 0.65mm/px · z∈[-59,+95]mm · 3 of 48 slices shown]
[im 1/48]
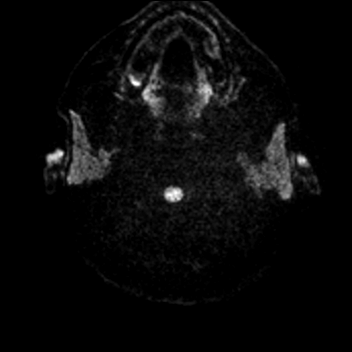
[im 24/48]
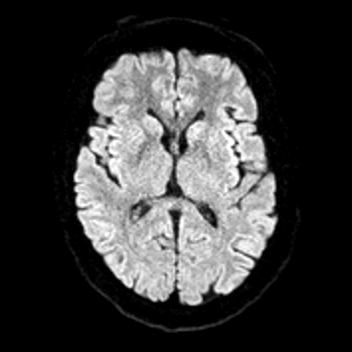
[im 48/48]
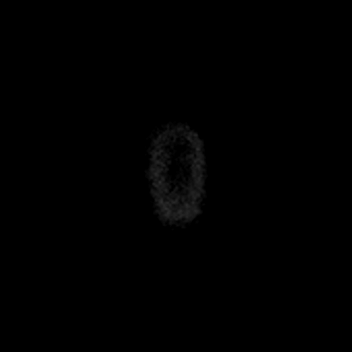

[Series 6: ax dwi_adc · axial · 3.0mm · 0.65mm/px · z∈[-59,+95]mm · 2 of 48 slices shown]
[im 1/48]
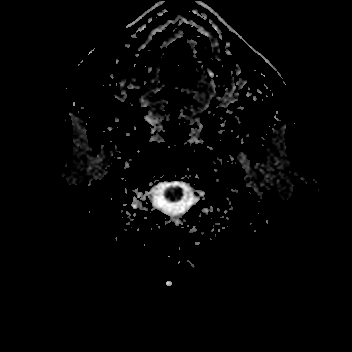
[im 48/48]
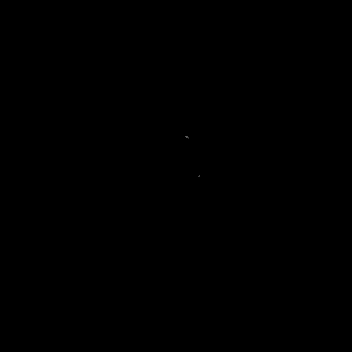

[Series 7: cor dwi_tracew · coronal · 5.0mm · 0.60mm/px · 2 of 38 slices shown]
[im 1/38]
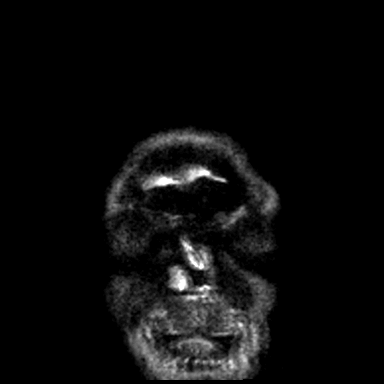
[im 38/38]
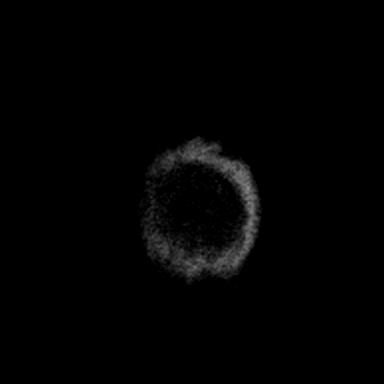

[Series 8: cor dwi_adc · coronal · 5.0mm · 0.60mm/px · 2 of 37 slices shown]
[im 1/37]
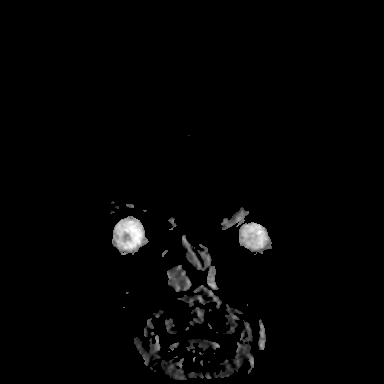
[im 37/37]
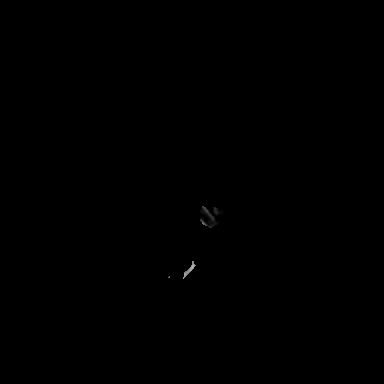

[Series 9: T1 · sagittal · 5.0mm · 0.62mm/px · 1 of 25 slices shown (1 of 2)]
[im 1/25]
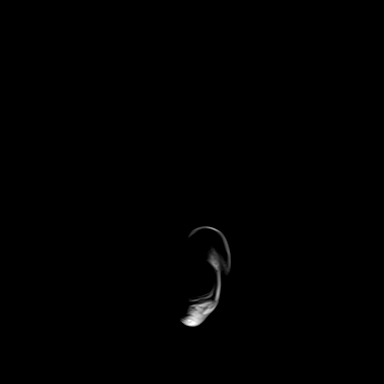

[Series 10: T2 · axial · 5.0mm · 0.53mm/px · 1 of 27 slices shown]
[im 1/27]
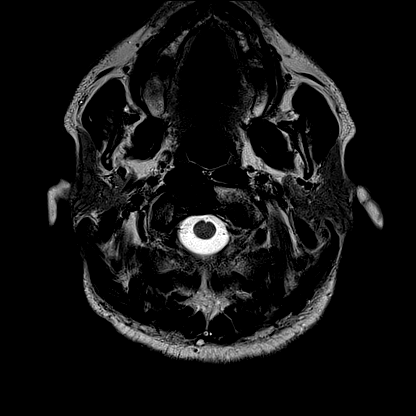

[Series 11: ax swi_mag · axial · 2.0mm · 0.90mm/px · z∈[-61,+95]mm · 4 of 80 slices shown]
[im 1/80]
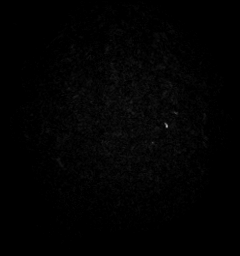
[im 27/80]
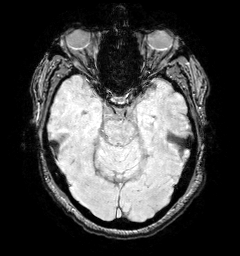
[im 53/80]
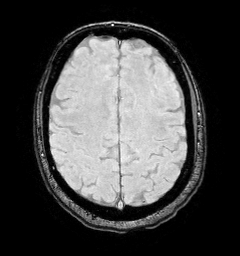
[im 80/80]
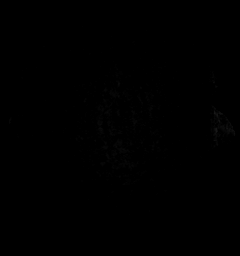

[Series 12: ax swi_pha · axial · 2.0mm · 0.90mm/px · z∈[-61,+95]mm · 4 of 80 slices shown]
[im 1/80]
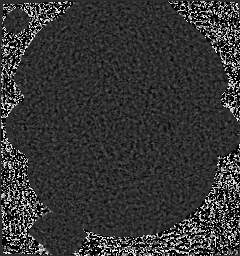
[im 27/80]
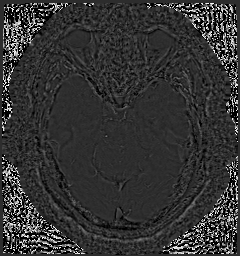
[im 53/80]
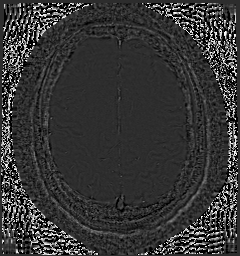
[im 80/80]
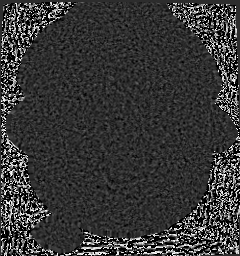

[Series 13: ax swi_swi · axial · 2.0mm · 0.90mm/px · z∈[-61,+95]mm · 4 of 80 slices shown]
[im 1/80]
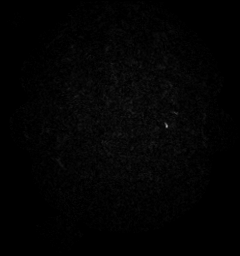
[im 27/80]
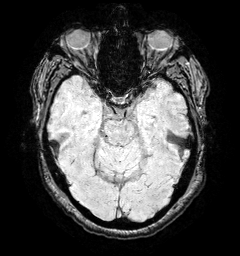
[im 53/80]
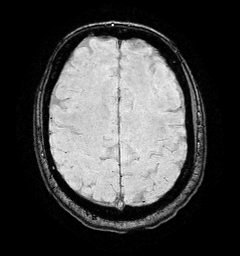
[im 80/80]
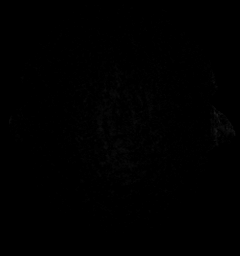

[Series 14: ax swi_swi_mip · axial · 16.0mm · 0.90mm/px · z∈[-54,+88]mm · 3 of 73 slices shown]
[im 1/73]
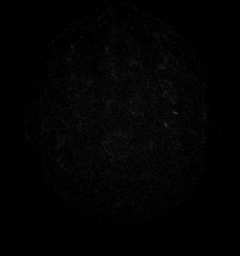
[im 37/73]
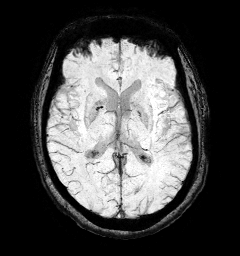
[im 73/73]
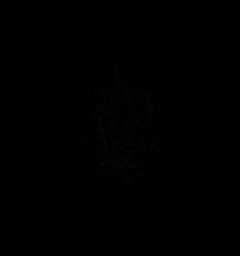

[Series 15: FLAIR · axial · 3.0mm · 0.53mm/px · z∈[-62,+98]mm · 3 of 55 slices shown]
[im 1/55]
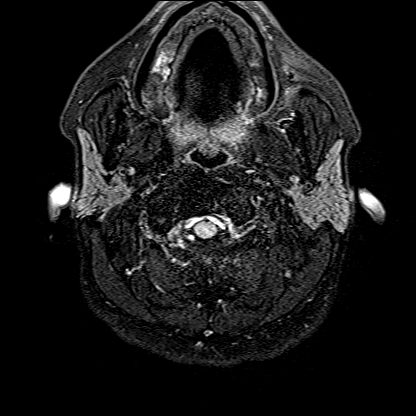
[im 28/55]
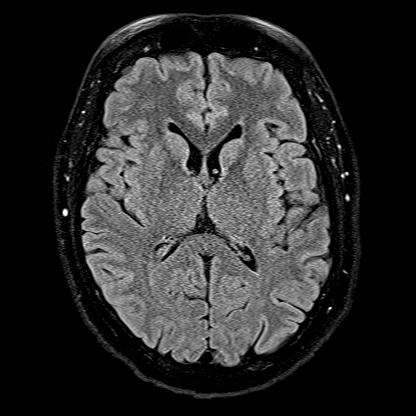
[im 55/55]
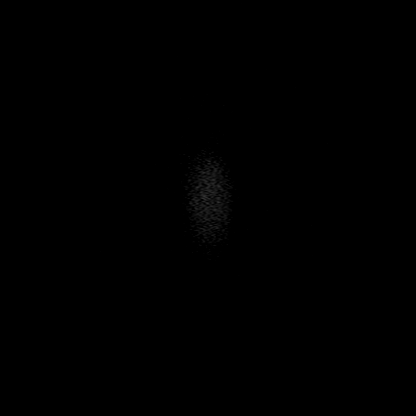

[Series 16: T1 · axial · 1.0mm · 0.98mm/px · z∈[-70,+103]mm · 8 of 175 slices shown (2 of 2)]
[im 1/175]
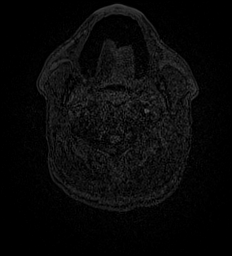
[im 25/175]
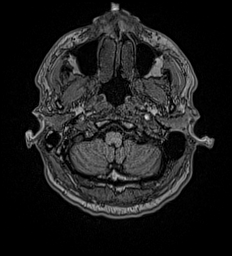
[im 50/175]
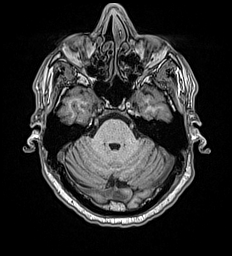
[im 75/175]
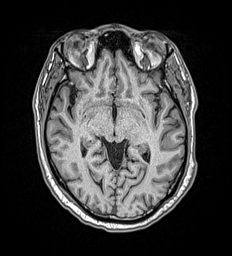
[im 100/175]
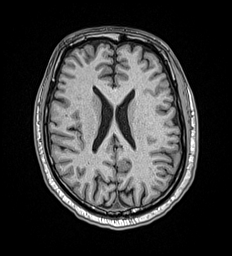
[im 125/175]
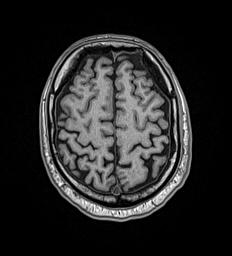
[im 150/175]
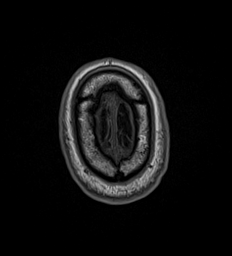
[im 175/175]
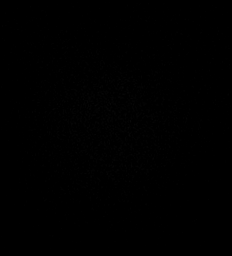

[Series 17: T2 post-contrast · coronal · 5.0mm · 0.57mm/px · 1 of 29 slices shown]
[im 1/29]
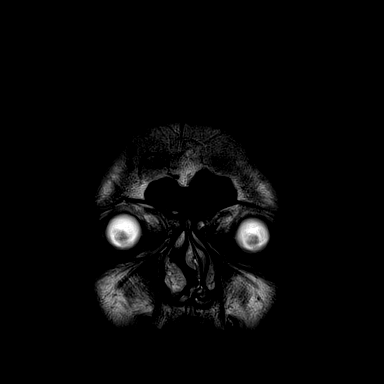

[Series 18: T1 post-contrast · axial · 1.0mm · 0.98mm/px · z∈[-70,+103]mm · 8 of 176 slices shown (1 of 3)]
[im 1/176]
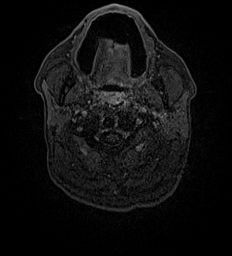
[im 26/176]
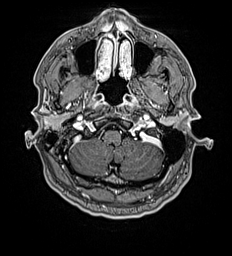
[im 51/176]
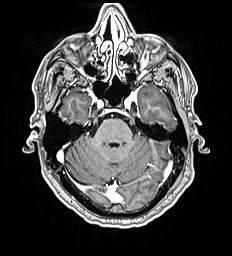
[im 76/176]
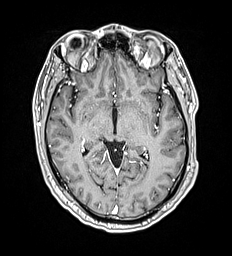
[im 101/176]
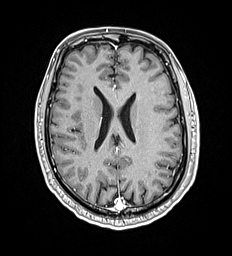
[im 126/176]
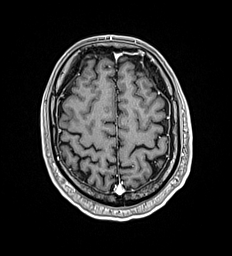
[im 151/176]
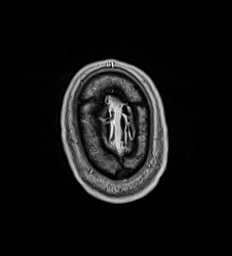
[im 176/176]
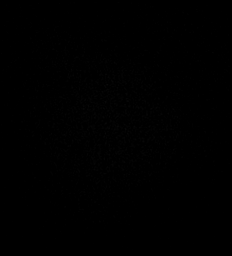

[Series 19: T1 post-contrast · coronal · 5.0mm · 0.57mm/px · 1 of 29 slices shown (2 of 3)]
[im 1/29]
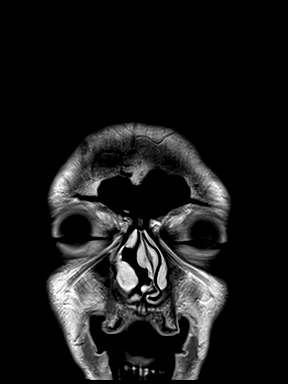

[Series 20: T1 post-contrast · sagittal · 5.0mm · 0.62mm/px · 1 of 25 slices shown (3 of 3)]
[im 1/25]
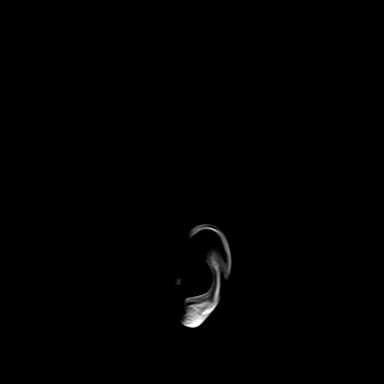

[48 of 48 positions shown; findings below may reference images not displayed]

FINDINGS: Brain: There is no acute infarction or intracranial hemorrhage.
There is no intracranial mass, mass effect, or edema. There is no
hydrocephalus or extra-axial fluid collection. Ventricles and sulci
are normal in size and configuration. No abnormal enhancement.

Vascular: Major vessel flow voids at the skull base are preserved.

Skull and upper cervical spine: Normal marrow signal is preserved.

Sinuses/Orbits: Minor mucosal thickening.  Orbits are unremarkable.

Other: Sella is unremarkable.  Mastoid air cells are clear.
IMPRESSION: No evidence of metastatic disease or other significant intracranial
abnormality.

## 2021-07-25 MED ORDER — GADOBUTROL 1 MMOL/ML IV SOLN
7.5000 mL | Freq: Once | INTRAVENOUS | Status: AC | PRN
  Administered 2021-07-25: 7.5 mL via INTRAVENOUS

## 2021-07-27 ENCOUNTER — Ambulatory Visit
Admission: RE | Admit: 2021-07-27 | Discharge: 2021-07-27 | Disposition: A | Discharge status: Home / Self Care | Payer: Self-pay | Source: Ambulatory Visit | Attending: Oncology | Admitting: Oncology

## 2021-07-27 DIAGNOSIS — Z08 Encounter for follow-up examination after completed treatment for malignant neoplasm: Secondary | ICD-10-CM | POA: Insufficient documentation

## 2021-07-27 DIAGNOSIS — C8510 Unspecified B-cell lymphoma, unspecified site: Secondary | ICD-10-CM

## 2021-07-27 DIAGNOSIS — Z8579 Personal history of other malignant neoplasms of lymphoid, hematopoietic and related tissues: Secondary | ICD-10-CM | POA: Insufficient documentation

## 2021-07-27 LAB — GLUCOSE, CAPILLARY: Glucose-Capillary: 100 mg/dL — ABNORMAL HIGH (ref 70–99)

## 2021-07-27 MED ORDER — FLUDEOXYGLUCOSE F - 18 (FDG) INJECTION
10.0000 | Freq: Once | INTRAVENOUS | Status: AC | PRN
  Administered 2021-07-27: 10.71 via INTRAVENOUS

## 2021-07-29 ENCOUNTER — Inpatient Hospital Stay (HOSPITAL_BASED_OUTPATIENT_CLINIC_OR_DEPARTMENT_OTHER): Payer: Self-pay | Admitting: Oncology

## 2021-07-29 ENCOUNTER — Inpatient Hospital Stay: Payer: Self-pay | Attending: Oncology

## 2021-07-29 DIAGNOSIS — Z8349 Family history of other endocrine, nutritional and metabolic diseases: Secondary | ICD-10-CM | POA: Insufficient documentation

## 2021-07-29 DIAGNOSIS — C8510 Unspecified B-cell lymphoma, unspecified site: Secondary | ICD-10-CM

## 2021-07-29 DIAGNOSIS — I700 Atherosclerosis of aorta: Secondary | ICD-10-CM | POA: Insufficient documentation

## 2021-07-29 DIAGNOSIS — Z809 Family history of malignant neoplasm, unspecified: Secondary | ICD-10-CM | POA: Insufficient documentation

## 2021-07-29 DIAGNOSIS — Z832 Family history of diseases of the blood and blood-forming organs and certain disorders involving the immune mechanism: Secondary | ICD-10-CM | POA: Insufficient documentation

## 2021-07-29 DIAGNOSIS — I10 Essential (primary) hypertension: Secondary | ICD-10-CM | POA: Insufficient documentation

## 2021-07-29 DIAGNOSIS — Z8269 Family history of other diseases of the musculoskeletal system and connective tissue: Secondary | ICD-10-CM | POA: Insufficient documentation

## 2021-07-29 DIAGNOSIS — Z87891 Personal history of nicotine dependence: Secondary | ICD-10-CM | POA: Insufficient documentation

## 2021-07-29 DIAGNOSIS — R7402 Elevation of levels of lactic acid dehydrogenase (LDH): Secondary | ICD-10-CM | POA: Insufficient documentation

## 2021-07-29 DIAGNOSIS — C8331 Diffuse large B-cell lymphoma, lymph nodes of head, face, and neck: Secondary | ICD-10-CM | POA: Insufficient documentation

## 2021-07-29 DIAGNOSIS — Z79899 Other long term (current) drug therapy: Secondary | ICD-10-CM | POA: Insufficient documentation

## 2021-07-29 DIAGNOSIS — Z7189 Other specified counseling: Secondary | ICD-10-CM

## 2021-07-29 DIAGNOSIS — Z8249 Family history of ischemic heart disease and other diseases of the circulatory system: Secondary | ICD-10-CM | POA: Insufficient documentation

## 2021-07-29 LAB — CBC WITH DIFFERENTIAL/PLATELET
Abs Immature Granulocytes: 0.01 10*3/uL (ref 0.00–0.07)
Basophils Absolute: 0 10*3/uL (ref 0.0–0.1)
Basophils Relative: 1 %
Eosinophils Absolute: 0 10*3/uL (ref 0.0–0.5)
Eosinophils Relative: 1 %
HCT: 37.8 % — ABNORMAL LOW (ref 39.0–52.0)
Hemoglobin: 13.5 g/dL (ref 13.0–17.0)
Immature Granulocytes: 0 %
Lymphocytes Relative: 53 %
Lymphs Abs: 2.4 10*3/uL (ref 0.7–4.0)
MCH: 31.9 pg (ref 26.0–34.0)
MCHC: 35.7 g/dL (ref 30.0–36.0)
MCV: 89.4 fL (ref 80.0–100.0)
Monocytes Absolute: 0.4 10*3/uL (ref 0.1–1.0)
Monocytes Relative: 10 %
Neutro Abs: 1.5 10*3/uL — ABNORMAL LOW (ref 1.7–7.7)
Neutrophils Relative %: 35 %
Platelets: 212 10*3/uL (ref 150–400)
RBC: 4.23 MIL/uL (ref 4.22–5.81)
RDW: 13.7 % (ref 11.5–15.5)
WBC: 4.4 10*3/uL (ref 4.0–10.5)
nRBC: 0 % (ref 0.0–0.2)

## 2021-07-29 LAB — COMPREHENSIVE METABOLIC PANEL
ALT: 31 U/L (ref 0–44)
AST: 32 U/L (ref 15–41)
Albumin: 4.3 g/dL (ref 3.5–5.0)
Alkaline Phosphatase: 83 U/L (ref 38–126)
Anion gap: 7 (ref 5–15)
BUN: 15 mg/dL (ref 8–23)
CO2: 27 mmol/L (ref 22–32)
Calcium: 9.2 mg/dL (ref 8.9–10.3)
Chloride: 106 mmol/L (ref 98–111)
Creatinine, Ser: 1.16 mg/dL (ref 0.61–1.24)
GFR, Estimated: >60 mL/min (ref >60)
Glucose, Bld: 92 mg/dL (ref 70–99)
Potassium: 3.9 mmol/L (ref 3.5–5.1)
Sodium: 140 mmol/L (ref 135–145)
Total Bilirubin: 0.7 mg/dL (ref 0.3–1.2)
Total Protein: 7 g/dL (ref 6.5–8.1)

## 2021-07-29 LAB — LACTATE DEHYDROGENASE: LDH: 155 U/L (ref 98–192)

## 2021-08-01 ENCOUNTER — Encounter: Payer: Self-pay | Admitting: Oncology

## 2021-08-01 NOTE — Progress Notes (Signed)
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  This was followed by a PET CT scan which showed extensive FDG avid adenopathy in the neck chest abdomen and pelvis.  FDG avid splenic lesions.  Solitary intramuscular FDG avid lesion in the right biceps femoris muscle.   Supraclavicular excisional lymph node biopsy showed high-grade B-cell lymphoma germinal center type Ki-67 greater than 95%.  FISH testing was positive for Bcl-2 BCL6 and MYC consistent with triple hit lymphoma.  By NCCN IPI score would be 4 based on age and elevated LDH and stage IV (intramuscular biceps femoris lesion) which puts him in the high intermediate risk group. 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   Patient received RCHOP for cycle 1 with plans for DA [facility] with cycle 2. IT MTX for CNS prophylaxis and he has received 3 cycles but declined further cycles.  MRI brain negative for lymphoma.  Chemotherapy was not being able to escalated upwards due to neutropenia which has persisted to 3 weeks and he has been receiving treatment every  4 weeks   PET CT scan after 3 cycles of chemotherapy showedComplete or near complete metabolic response.  Residual disease in the anterior upper right mediastinum slightly greater than background mediastinal activity  Interval history-currently feels well and denies any specific complaints at this time  ECOG PS- 0 Pain scale- 0   Review of systems- Review of Systems  Constitutional:  Negative for chills, fever, malaise/fatigue and weight loss.  HENT:  Negative for congestion, ear discharge and nosebleeds.   Eyes:  Negative for blurred vision.  Respiratory:  Negative for cough, hemoptysis, sputum production, shortness of breath and wheezing.   Cardiovascular:  Negative for chest pain, palpitations, orthopnea and claudication.  Gastrointestinal:  Negative for abdominal pain, blood in stool, constipation, diarrhea, heartburn, melena, nausea and vomiting.  Genitourinary:  Negative for dysuria, flank pain, frequency, hematuria and urgency.  Musculoskeletal:  Negative for back pain, joint pain and myalgias.  Skin:  Negative for rash.  Neurological:  Negative for dizziness, tingling, focal weakness, seizures, weakness and headaches.  Endo/Heme/Allergies:  Does not bruise/bleed easily.  Psychiatric/Behavioral:  Negative for depression and suicidal ideas. The patient does not have insomnia.      No Known Allergies   Past Medical History:  Diagnosis Date   Acute kidney injury ([place]) 07/12/2020   Dyspnea    Hypertension    Lymphoma of lymph nodes of neck ([place]) 06/18/2020     Past Surgical History:  Procedure Laterality Date   BONE MARROW BIOPSY     COLONOSCOPY     EXCISION MASS NECK Right 06/12/2020   Procedure: EXCISION MASS NECK;  Surgeon:  Jules Husbands, MD;  Location: [facility] ORS;  Service: General;  Laterality: Right;   HERNIA REPAIR Right    at age 44-RIH   61 [place] Right 06/24/2020   Procedure: INSERTION PORT-A-CATH;  Surgeon: Jules Husbands, MD;   Location: [facility] ORS;  Service: General;  Laterality: Right;    Social History   Socioeconomic History   Marital status: Single    Spouse name: Not on file   Number of children: Not on file   Years of education: Not on file   Highest education level: Not on file  Occupational History   Not on file  Tobacco Use   Smoking status: Former    Types: Cigarettes    Quit date: 06/13/2020    Years since quitting: 1.1   Smokeless tobacco: Never  Vaping Use   Vaping Use: Never used  Substance and Sexual Activity   Alcohol use: Not Currently   Drug use: Not Currently    Types: Marijuana   Sexual activity: Not Currently  Other Topics Concern   Not on file  Social History Narrative   Has daughter and grandson in the home   Social Determinants of Health   Financial Resource Strain: Not on file  Food Insecurity: Not on file  Transportation Needs: Not on file  Physical Activity: Not on file  Stress: Not on file  Social Connections: Not on file  Intimate Partner Violence: Not on file    Family History  Problem Relation Age of Onset   Anemia Mother    Hypertension Mother    Goiter Mother    Cancer Father    Cancer Sister    Multiple sclerosis Sister      Current Outpatient Medications:    allopurinol (ZYLOPRIM) 300 MG tablet, Take 1 tablet (300 mg total) by mouth daily., Disp: 30 tablet, Rfl: 1   atenolol (TENORMIN) 25 MG tablet, Take 25 mg by mouth daily., Disp: , Rfl:    cholecalciferol (VITAMIN D3) 25 MCG (1000 UNIT) tablet, Take 1,000 Units by mouth daily., Disp: , Rfl:    lisinopril (ZESTRIL) 10 MG tablet, Take 10 mg by mouth daily., Disp: , Rfl:    Omega-3 Fatty Acids (FISH OIL ADULT GUMMIES PO), Take by mouth., Disp: , Rfl:    vitamin B-12 (CYANOCOBALAMIN) 100 MCG tablet, Take 100 mcg by mouth daily., Disp: , Rfl:    vitamin C (ASCORBIC ACID) 500 MG tablet, Take 500 mg by mouth daily., Disp: , Rfl:    VITAMIN D PO, Take 1 capsule by mouth daily., Disp: , Rfl:  No current  facility-administered medications for this visit.  Facility-Administered Medications Ordered in Other Visits:    0.9 %  sodium chloride infusion, , Intravenous, Continuous, Sindy Guadeloupe, MD, Stopped at 08/12/20 1310   0.9 %  sodium chloride infusion, , Intravenous, Continuous, Sindy Guadeloupe, MD, Stopped at 09/08/20 0944   0.9 %  sodium chloride infusion, , Intravenous, Continuous, Sindy Guadeloupe, MD, Stopped at 09/09/20 1507   0.9 %  sodium chloride infusion, , Intravenous, Continuous, Rogue Bussing, Elisha Headland, MD, Stopped at 09/11/20 1502   0.9 %  sodium chloride infusion, , Intravenous, Continuous, Sindy Guadeloupe, MD, Stopped at 10/06/20 1330   0.9 %  sodium chloride infusion, , Intravenous, Continuous, Sindy Guadeloupe, MD, Stopped at 11/03/20 1225   0.9 %  sodium chloride infusion, , Intravenous, Continuous, Sindy Guadeloupe, MD, Stopped at 11/06/20 1424   0.9 %  sodium chloride infusion, , Intravenous, Continuous, Sindy Guadeloupe, MD, Stopped at 12/01/20 1213   heparin lock flush 100 unit/mL, 500 Units, Intravenous, Once, Sindy Guadeloupe, MD   heparin lock flush 100 unit/mL, 500 Units, Intracatheter, Once PRN, Sindy Guadeloupe, MD   methotrexate (PF) 12 mg in sodium chloride (PF) 0.9 % INTRATHECAL chemo injection, , Intrathecal, Once, Sindy Guadeloupe, MD   sodium chloride flush (NS) 0.9 % injection 10 mL, 10 mL, Intravenous, PRN, Sindy Guadeloupe, MD, 10 mL at 08/13/20 1510   sodium chloride flush (NS) 0.9 % injection 10 mL, 10 mL, Intravenous, PRN, Sindy Guadeloupe, MD, 10 mL at 11/04/20 1408   sodium chloride flush (NS) 0.9 % injection 10 mL, 10 mL, Intracatheter, PRN, Sindy Guadeloupe, MD   sodium chloride flush (NS) 0.9 % injection 10 mL, 10 mL, Intravenous, PRN, Sindy Guadeloupe, MD, 10 mL at 12/02/20 1505  Physical exam:  Physical Exam Constitutional:      General: He is not in acute distress. Cardiovascular:     Rate and Rhythm: Normal rate and regular rhythm.     Heart sounds: Normal heart  sounds.  Pulmonary:     Effort: Pulmonary effort is normal.  Skin:    General: Skin is warm and dry.  Neurological:     Mental Status: He is alert and oriented to person, place, and time.        Latest Ref Rng & Units 07/29/2021    2:44 PM  CMP  Glucose 70 - 99 mg/dL 92    BUN 8 - 23 mg/dL 15    Creatinine 0.61 - 1.24 mg/dL 1.16    Sodium 135 - 145 mmol/L 140    Potassium 3.5 - 5.1 mmol/L 3.9    Chloride 98 - 111 mmol/L 106    CO2 22 - 32 mmol/L 27    Calcium 8.9 - 10.3 mg/dL 9.2    Total Protein 6.5 - 8.1 g/dL 7.0    Total Bilirubin 0.3 - 1.2 mg/dL 0.7    Alkaline Phos 38 - 126 U/L 83    AST 15 - 41 U/L 32    ALT 0 - 44 U/L 31        Latest Ref Rng & Units 07/29/2021    2:44 PM  CBC  WBC 4.0 - 10.5 K/uL 4.4    Hemoglobin 13.0 - 17.0 g/dL 13.5    Hematocrit 39.0 - 52.0 % 37.8    Platelets 150 - 400 K/uL 212      No images are attached to the encounter.  MR Brain W Wo Contrast  Result Date: 07/27/2021 CLINICAL DATA:  Lymphoma EXAM: MRI HEAD WITHOUT AND WITH CONTRAST TECHNIQUE: Multiplanar, multiecho pulse sequences of the brain and surrounding structures were obtained without and with intravenous contrast. CONTRAST:  7.41m GADAVIST GADOBUTROL 1 MMOL/ML IV SOLN COMPARISON:  None Available. FINDINGS: Brain: There is no acute infarction or intracranial hemorrhage. There is no intracranial mass, mass effect, or edema. There is no hydrocephalus or extra-axial fluid collection. Ventricles and sulci are normal in size and configuration. No abnormal enhancement. Vascular: Major vessel flow voids at the skull base are preserved. Skull and upper cervical spine: Normal marrow signal is preserved. Sinuses/Orbits: Minor mucosal thickening.  Orbits are unremarkable. Other: Sella is unremarkable.  Mastoid air cells are clear. IMPRESSION: No evidence of metastatic disease or other significant intracranial abnormality. Electronically Signed   By: PMacy MisM.D.   On: 07/27/2021 15:00  US  Soft Tissue Head/Neck  Result Date: 07/13/2021 CLINICAL DATA:  Provided history: High-grade B-cell lymphoma, supraclavicular lymphadenopathy. Palpable supraclavicular nodule on right side. EXAM: ULTRASOUND OF HEAD/NECK SOFT TISSUES TECHNIQUE: Ultrasound examination of the neck soft tissues was performed in the area of clinical concern. COMPARISON:  PET-CT 03/17/2021. FINDINGS: Targeted ultrasound was performed in the right lower neck/right supraclavicular region of concern. At this site, there is a heterogeneous round-to-ovoid subcutaneous soft tissue focus measuring 1.7 x 1.0 x 1.3 cm. There is associated color Doppler flow. These results will be called to the ordering clinician or representative by the Radiologist Assistant, and communication documented in the PACS or [employer]. IMPRESSION: 1.7 x 1.0 x 1.3 cm heterogeneous round-to-ovoid subcutaneous soft tissue focus within the right supraclavicular region of concern. Although nonspecific, this could reflect a mass or abnormal lymph node and a contrast-enhanced neck CT is recommended for further evaluation. Lipoma is also a consideration, although no lipoma was present at this site on the prior PET-CT of 03/17/2021. Electronically Signed   By: Kellie Simmering D.O.   On: 07/13/2021 08:45   NM PET Image Restag (PS) Skull Base To Thigh  Result Date: 07/28/2021 CLINICAL DATA:  Subsequent treatment strategy for diffuse large B-cell lymphoma. EXAM: NUCLEAR MEDICINE PET SKULL BASE TO THIGH TECHNIQUE: 10.7 mCi F-18 FDG was injected intravenously. Full-ring PET imaging was performed from the skull base to thigh after the radiotracer. CT data was obtained and used for attenuation correction and anatomic localization. Fasting blood glucose: 100 mg/dl COMPARISON:  03/17/2021 PET-CT FINDINGS: Mediastinal blood pool activity: SUV max 1.9 Liver activity: SUV max 2.8 NECK: New right level V adenopathy observed with the dominant node measuring 1.6 cm in short axis on  image 54 of series 2 with maximum SUV 13.1 (Deauville 5). Incidental CT findings: none CHEST: Indistinct density compatible with post therapy related findings in the right paratracheal region, maximum SUV 1.9 (Deauville 2). Incidental CT findings: Right Port-A-Cath tip: SVC. Mild atherosclerotic calcification of the aortic arch. ABDOMEN/PELVIS: Gastric antral activity is again observed, maximum SUV 7.3 (formerly 8.1), probably physiologic given the lack of obvious findings on the CT data. Normal size and activity of the spleen. Incidental CT findings: Atherosclerosis is present, including aortoiliac atherosclerotic disease. SKELETON: No significant abnormal hypermetabolic activity in this region. Incidental CT findings: none IMPRESSION: 1. New right level V adenopathy in the lower neck, dominant lymph node 1.6 cm in short axis with maximum SUV of 13.1 (Deauville 5). 2. No findings of active lymphoma in the chest, abdomen, pelvis, or included skeleton. Activity in the stomach antrum is probably physiologic. 3. Aortic Atherosclerosis (ICD10-I70.0). Electronically Signed   By: Van Clines M.D.   On: 07/28/2021 15:49   [place] CORE BIOPSY (SOFT TISSUE)  Result Date: 07/17/2021 INDICATION: 61 year old gentleman with history of lymphoma presents to IR for biopsy of enlarging right neck mass. EXAM: Ultrasound-guided biopsy of right neck mass MEDICATIONS: None. ANESTHESIA/SEDATION: None COMPLICATIONS: None immediate. PROCEDURE: Informed written consent was obtained from the patient after a thorough discussion of the procedural risks, benefits and alternatives. All questions were addressed. Maximal Sterile Barrier Technique was utilized including caps, mask, sterile gowns, sterile gloves, sterile drape, hand hygiene and skin antiseptic. A timeout was performed prior to the initiation of the procedure. Patient position left lateral decubitus on the ultrasound table. Right neck skin prepped and draped in usual sterile  fashion. Following local lidocaine administration, 4-18 gauge cores were obtained from the right neck mass utilizing continuous ultrasound guidance.  Samples were sent to pathology in sterile saline. Needle removed and hemostasis achieved with 2 minutes of manual compression. Post procedure ultrasound images showed no evidence of significant hemorrhage. IMPRESSION: Ultrasound-guided biopsy of enlarging right neck mass. Electronically Signed   By: Miachel Roux M.D.   On: 07/17/2021 14:25     Assessment and plan- Patient is a 61 y.o. male with history of B-cell lymphoma s/p R-EPOCH chemotherapy ending in September 2022 now in relapse here to discuss further management  Patient completed R-EPOCH chemotherapy in September 2022 and has been in remission since then.  He was found to have palpable right supraclavicular adenopathy which prompted a biopsy and a repeat PET scan.  I have reviewed PET CT scan images independently and discussed findings with the patient which showsNew level 5 right neck adenopathy with a dominant node measuring 1.6 cm Deauville 5.  This was biopsied and was consistent with patient's known high-grade B-cell lymphoma.  His baseline bone marrow biopsy at diagnosis did not show any evidence of high-grade B-cell lymphoma but showed low-grade B-cell lymphoma.  I also repeated an MRI brain at this time which does not show any evidence of leptomeningeal involvement  He has had a relapse within 1 year of chemotherapy.  Therefore standard of care would be CAR-T cell therapy.CAR-T cell therapy is a form of immunotherapy in which the patient's own T lymphocytes are transfected with a gene that encodes a CAR to direct the patient's T cells against the lymphoma.  This is typically given in a tertiary center since there is a potential to have significant side effects such as cytokine release syndrome.  I am therefore referring the patient to [facility] for further management.  I have discussed this case with  Dr. Lonia Blood at [facility].  Patient does not require follow-up with me at this time but I am happy to coordinate his care as needed with [facility].    Visit Diagnosis 1. Diffuse large B-cell lymphoma of lymph nodes of neck (HCC)   2. Goals of care, counseling/discussion      Dr. Randa Evens, MD, MPH [facility] at [facility] [phone] 08/01/2021 12:32 PM

## 2021-08-07 ENCOUNTER — Telehealth: Payer: Self-pay | Admitting: Pharmacist

## 2021-08-07 ENCOUNTER — Encounter: Payer: Self-pay | Admitting: Oncology

## 2021-08-07 ENCOUNTER — Telehealth: Payer: Self-pay | Admitting: Pharmacy Technician

## 2021-08-07 ENCOUNTER — Inpatient Hospital Stay (HOSPITAL_BASED_OUTPATIENT_CLINIC_OR_DEPARTMENT_OTHER): Payer: Self-pay | Admitting: Oncology

## 2021-08-07 ENCOUNTER — Other Ambulatory Visit (HOSPITAL_COMMUNITY): Payer: Self-pay

## 2021-08-07 VITALS — BP 117/73 | HR 73 | Temp 97.3°F | Resp 16 | Wt 188.2 lb

## 2021-08-07 DIAGNOSIS — Z7189 Other specified counseling: Secondary | ICD-10-CM

## 2021-08-07 DIAGNOSIS — C8331 Diffuse large B-cell lymphoma, lymph nodes of head, face, and neck: Secondary | ICD-10-CM

## 2021-08-07 DIAGNOSIS — C8510 Unspecified B-cell lymphoma, unspecified site: Secondary | ICD-10-CM

## 2021-08-07 MED ORDER — PROCHLORPERAZINE MALEATE 10 MG PO TABS: 10.0000 mg | ORAL_TABLET | Freq: Four times a day (QID) | ORAL | 1 refills | Status: DC | PRN

## 2021-08-07 MED ORDER — ONDANSETRON HCL 8 MG PO TABS: 8.0000 mg | ORAL_TABLET | Freq: Two times a day (BID) | ORAL | 1 refills | Status: DC | PRN

## 2021-08-07 MED ORDER — LENALIDOMIDE 15 MG PO CAPS: 15.0000 mg | ORAL_CAPSULE | Freq: Every day | ORAL | 0 refills | Status: DC

## 2021-08-07 MED ORDER — PREDNISONE 20 MG PO TABS: 20.0000 mg | ORAL_TABLET | Freq: Every day | ORAL | 0 refills | Status: DC

## 2021-08-07 MED ORDER — LIDOCAINE-PRILOCAINE 2.5-2.5 % EX CREA: TOPICAL_CREAM | CUTANEOUS | 3 refills | Status: DC

## 2021-08-07 NOTE — Telephone Encounter (Signed)
Oral Oncology Pharmacist Encounter  Received new prescription for Revlimid (lenalidomide) for the treatment of relapsed triple hit DLBCL in conjunction with tafasitamab, planned duration of lenalidomide up to 12 cycles (per study).  CMP from 08/05/21 (Care Everywhere) assessed, no relevant lab abnormalities. Prescription dose and frequency assessed.   Current medication list in Epic reviewed, no DDIs with lenalidomide identified.   Evaluated chart and no patient barriers to medication adherence identified.   Patient is uninsured and will manufacturer assistance.   Patient agreed to treatment on 08/07/21 per MD documentation.  Darl Pikes, PharmD, BCPS, BCOP, CPP Hematology/Oncology Clinical Pharmacist Practitioner [place]/DB/AP Oral [facility] [phone]  08/07/2021 2:36 PM

## 2021-08-07 NOTE — Progress Notes (Signed)
DISCONTINUE ON PATHWAY REGIMEN - Lymphoma and CLL     A cycle is every 21 days:     Prednisone      Rituximab-xxxx      Etoposide      Doxorubicin      Vincristine      Cyclophosphamide      Filgrastim-xxxx   **Always confirm dose/schedule in your pharmacy ordering system**  REASON: Disease Progression PRIOR TREATMENT: [id number]: DA-EPOCH-R q21 Days x 6 Cycles TREATMENT RESPONSE: Progressive Disease (PD)  START OFF PATHWAY REGIMEN - Lymphoma and CLL   [id number]:Lenalidomide 25 mg PO + Tafasitamab 12 mg/kg IV (C1-12) Followed by Tafasitamab 12 mg/kg IV (C13+):   Cycle 1: A cycle is 28 days:     Lenalidomide      Tafasitamab-cxix    Cycles 2 and 3: A cycle is every 28 days:     Lenalidomide      Tafasitamab-cxix    Cycles 4 through 12: A cycle is every 28 days:     Lenalidomide      Tafasitamab-cxix    Cycles 13 and beyond: A cycle is every 28 days:     Tafasitamab-cxix   **Always confirm dose/schedule in your pharmacy ordering system**  Patient Characteristics: Double Hit Lymphoma, Second Line and Beyond Disease Type: Not Applicable Disease Type: Double Hit Lymphoma Disease Type: Not Applicable Line of therapy: Second Line and Beyond Intent of Therapy: Curative Intent, Discussed with Patient

## 2021-08-10 ENCOUNTER — Encounter: Payer: Self-pay | Admitting: Oncology

## 2021-08-10 DIAGNOSIS — Z7189 Other specified counseling: Secondary | ICD-10-CM | POA: Insufficient documentation

## 2021-08-10 NOTE — Progress Notes (Signed)
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  This was followed by a PET CT scan which showed extensive FDG avid adenopathy in the neck chest abdomen and pelvis.  FDG avid splenic lesions.  Solitary intramuscular FDG avid lesion in the right biceps femoris muscle.   Supraclavicular excisional lymph node biopsy showed high-grade B-cell lymphoma germinal center type Ki-67 greater than 95%.  FISH testing was positive for Bcl-2 BCL6 and MYC consistent with triple hit lymphoma.  By NCCN IPI score would be 4 based on age and elevated LDH and stage IV (intramuscular biceps femoris lesion) which puts him in the high intermediate risk group. 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   Patient received RCHOP for cycle 1 with plans for DA [facility] with cycle 2. IT MTX for CNS prophylaxis and he has received 3 cycles but declined further cycles.  MRI brain negative for lymphoma.  Chemotherapy was not being able to escalated upwards due to neutropenia which has persisted to 3 weeks and he has been receiving treatment every 4 weeks   PET  CT scan after 3 cycles of chemotherapy showedComplete or near complete metabolic response.  Residual disease in the anterior upper right mediastinum slightly greater than background mediastinal activity  Patient found to have a clinically palpable right supraclavicular lymph node which was subsequently biopsied and was consistent with previously diagnosed lymphoma.  PET CT scan showedNew right level 5 adenopathy in the lower neck with dominant lymph node 1.6 cm Deauville 5.  No other findings of acute active lymphoma in chest abdomen pelvis and bones.  MRI brain negative for leptomeningeal disease.  Patient seen for second opinion by Dr. Lonia Blood at [facility] for consideration of CAR-T cell therapy.  Patient did not wish to proceed with CAR-T cell treatment after reviewing risks versus benefits.  Moreover patient's health insurance will also not cover CAR-T cell therapy.  Interval history-patient currently feels well.  Denies any changes in his appetite or weight.  Right supraclavicular lymph node has grown in size especially after biopsy.  ECOG PS- 0 Pain scale- 0   Review of systems- Review of Systems  Constitutional:  Negative for chills, fever, malaise/fatigue and weight loss.  HENT:  Negative for congestion, ear discharge and nosebleeds.   Eyes:  Negative for blurred vision.  Respiratory:  Negative for cough, hemoptysis, sputum production, shortness of breath and wheezing.   Cardiovascular:  Negative for chest pain, palpitations, orthopnea and claudication.  Gastrointestinal:  Negative for abdominal pain, blood in stool, constipation, diarrhea, heartburn, melena, nausea and vomiting.  Genitourinary:  Negative for dysuria, flank pain, frequency, hematuria and urgency.  Musculoskeletal:  Negative for back pain, joint pain and myalgias.  Skin:  Negative for  rash.  Neurological:  Negative for dizziness, tingling, focal weakness, seizures, weakness and headaches.  Endo/Heme/Allergies:  Does not  bruise/bleed easily.  Psychiatric/Behavioral:  Negative for depression and suicidal ideas. The patient does not have insomnia.      No Known Allergies   Past Medical History:  Diagnosis Date   Acute kidney injury ([place]) 07/12/2020   Dyspnea    Hypertension    Lymphoma of lymph nodes of neck ([place]) 06/18/2020     Past Surgical History:  Procedure Laterality Date   BONE MARROW BIOPSY     COLONOSCOPY     EXCISION MASS NECK Right 06/12/2020   Procedure: EXCISION MASS NECK;  Surgeon: Jules Husbands, MD;  Location: [facility] ORS;  Service: General;  Laterality: Right;   HERNIA REPAIR Right    at age 8-RIH   70 [place] Right 06/24/2020   Procedure: INSERTION PORT-A-CATH;  Surgeon: Jules Husbands, MD;  Location: [facility] ORS;  Service: General;  Laterality: Right;    Social History   Socioeconomic History   Marital status: Single    Spouse name: Not on file   Number of children: Not on file   Years of education: Not on file   Highest education level: Not on file  Occupational History   Not on file  Tobacco Use   Smoking status: Former    Types: Cigarettes    Quit date: 06/13/2020    Years since quitting: 1.1   Smokeless tobacco: Never  Vaping Use   Vaping Use: Never used  Substance and Sexual Activity   Alcohol use: Not Currently   Drug use: Not Currently    Types: Marijuana   Sexual activity: Not Currently  Other Topics Concern   Not on file  Social History Narrative   Has daughter and grandson in the home   Social Determinants of Health   Financial Resource Strain: Not on file  Food Insecurity: Not on file  Transportation Needs: Not on file  Physical Activity: Not on file  Stress: Not on file  Social Connections: Not on file  Intimate Partner Violence: Not on file    Family History  Problem Relation Age of Onset   Anemia Mother    Hypertension Mother    Goiter Mother    Cancer Father    Cancer Sister    Multiple sclerosis Sister       Current Outpatient Medications:    allopurinol (ZYLOPRIM) 300 MG tablet, Take 1 tablet (300 mg total) by mouth daily., Disp: 30 tablet, Rfl: 1   atenolol (TENORMIN) 25 MG tablet, Take 25 mg by mouth daily., Disp: , Rfl:    cholecalciferol (VITAMIN D3) 25 MCG (1000 UNIT) tablet, Take 1,000 Units by mouth daily., Disp: , Rfl:    lisinopril (ZESTRIL) 10 MG tablet, Take 10 mg by mouth daily., Disp: , Rfl:    Omega-3 Fatty Acids (FISH OIL ADULT GUMMIES PO), Take by mouth., Disp: , Rfl:    predniSONE (DELTASONE) 20 MG tablet, Take 1 tablet (20 mg total) by mouth daily with breakfast. Take 3 tablets daily in am after eating for 5 days, Disp: 15 tablet, Rfl: 0   vitamin B-12 (CYANOCOBALAMIN) 100 MCG tablet, Take 100 mcg by mouth daily., Disp: , Rfl:    vitamin C (ASCORBIC ACID) 500 MG tablet, Take 500 mg by mouth daily., Disp: , Rfl:    VITAMIN D PO, Take 1 capsule by mouth daily., Disp: , Rfl:  lenalidomide (REVLIMID) 15 MG capsule, Take 1 capsule (15 mg total) by mouth daily. Take for 21 days, then hold for 7 days. Repeat every 28 days., Disp: 21 capsule, Rfl: 0   lidocaine-prilocaine (EMLA) cream, Apply to affected area once, Disp: 30 g, Rfl: 3   ondansetron (ZOFRAN) 8 MG tablet, Take 1 tablet (8 mg total) by mouth 2 (two) times daily as needed (Nausea or vomiting)., Disp: 30 tablet, Rfl: 1   prochlorperazine (COMPAZINE) 10 MG tablet, Take 1 tablet (10 mg total) by mouth every 6 (six) hours as needed (Nausea or vomiting)., Disp: 30 tablet, Rfl: 1 No current facility-administered medications for this visit.  Facility-Administered Medications Ordered in Other Visits:    0.9 %  sodium chloride infusion, , Intravenous, Continuous, Sindy Guadeloupe, MD, Stopped at 09/09/20 1507   0.9 %  sodium chloride infusion, , Intravenous, Continuous, Cammie Sickle, MD, Stopped at 09/11/20 1502   0.9 %  sodium chloride infusion, , Intravenous, Continuous, Sindy Guadeloupe, MD, Stopped at 11/06/20 1424    heparin lock flush 100 unit/mL, 500 Units, Intravenous, Once, Sindy Guadeloupe, MD   methotrexate (PF) 12 mg in sodium chloride (PF) 0.9 % INTRATHECAL chemo injection, , Intrathecal, Once, Sindy Guadeloupe, MD   sodium chloride flush (NS) 0.9 % injection 10 mL, 10 mL, Intravenous, PRN, Sindy Guadeloupe, MD, 10 mL at 08/13/20 1510   sodium chloride flush (NS) 0.9 % injection 10 mL, 10 mL, Intravenous, PRN, Sindy Guadeloupe, MD, 10 mL at 11/04/20 1408   sodium chloride flush (NS) 0.9 % injection 10 mL, 10 mL, Intravenous, PRN, Sindy Guadeloupe, MD, 10 mL at 12/02/20 1505  Physical exam:  Vitals:   08/07/21 1334  BP: 117/73  Pulse: 73  Resp: 16  Temp: (!) 97.3 F (36.3 C)  SpO2: 100%  Weight: 188 lb 3.2 oz (85.4 kg)   Physical Exam Cardiovascular:     Rate and Rhythm: Normal rate and regular rhythm.     Heart sounds: Normal heart sounds.  Pulmonary:     Effort: Pulmonary effort is normal.     Breath sounds: Normal breath sounds.  Abdominal:     General: Bowel sounds are normal.     Palpations: Abdomen is soft.  Lymphadenopathy:     Comments: Palpable right supraclavicular lymph node which is now roughly 2 cm in size  Skin:    General: Skin is warm and dry.  Neurological:     Mental Status: He is alert and oriented to person, place, and time.        Latest Ref Rng & Units 07/29/2021    2:44 PM  CMP  Glucose 70 - 99 mg/dL 92    BUN 8 - 23 mg/dL 15    Creatinine 0.61 - 1.24 mg/dL 1.16    Sodium 135 - 145 mmol/L 140    Potassium 3.5 - 5.1 mmol/L 3.9    Chloride 98 - 111 mmol/L 106    CO2 22 - 32 mmol/L 27    Calcium 8.9 - 10.3 mg/dL 9.2    Total Protein 6.5 - 8.1 g/dL 7.0    Total Bilirubin 0.3 - 1.2 mg/dL 0.7    Alkaline Phos 38 - 126 U/L 83    AST 15 - 41 U/L 32    ALT 0 - 44 U/L 31        Latest Ref Rng & Units 07/29/2021    2:44 PM  CBC  WBC 4.0 - 10.5  K/uL 4.4    Hemoglobin 13.0 - 17.0 g/dL 13.5    Hematocrit 39.0 - 52.0 % 37.8    Platelets 150 - 400 K/uL 212         MR Brain W Wo Contrast  Result Date: 07/27/2021 CLINICAL DATA:  Lymphoma EXAM: MRI HEAD WITHOUT AND WITH CONTRAST TECHNIQUE: Multiplanar, multiecho pulse sequences of the brain and surrounding structures were obtained without and with intravenous contrast. CONTRAST:  7.97m GADAVIST GADOBUTROL 1 MMOL/ML IV SOLN COMPARISON:  None Available. FINDINGS: Brain: There is no acute infarction or intracranial hemorrhage. There is no intracranial mass, mass effect, or edema. There is no hydrocephalus or extra-axial fluid collection. Ventricles and sulci are normal in size and configuration. No abnormal enhancement. Vascular: Major vessel flow voids at the skull base are preserved. Skull and upper cervical spine: Normal marrow signal is preserved. Sinuses/Orbits: Minor mucosal thickening.  Orbits are unremarkable. Other: Sella is unremarkable.  Mastoid air cells are clear. IMPRESSION: No evidence of metastatic disease or other significant intracranial abnormality. Electronically Signed   By: PMacy MisM.D.   On: 07/27/2021 15:00   NM PET Image Restag (PS) Skull Base To Thigh  Result Date: 07/28/2021 CLINICAL DATA:  Subsequent treatment strategy for diffuse large B-cell lymphoma. EXAM: NUCLEAR MEDICINE PET SKULL BASE TO THIGH TECHNIQUE: 10.7 mCi F-18 FDG was injected intravenously. Full-ring PET imaging was performed from the skull base to thigh after the radiotracer. CT data was obtained and used for attenuation correction and anatomic localization. Fasting blood glucose: 100 mg/dl COMPARISON:  03/17/2021 PET-CT FINDINGS: Mediastinal blood pool activity: SUV max 1.9 Liver activity: SUV max 2.8 NECK: New right level V adenopathy observed with the dominant node measuring 1.6 cm in short axis on image 54 of series 2 with maximum SUV 13.1 (Deauville 5). Incidental CT findings: none CHEST: Indistinct density compatible with post therapy related findings in the right paratracheal region, maximum SUV 1.9  (Deauville 2). Incidental CT findings: Right Port-A-Cath tip: SVC. Mild atherosclerotic calcification of the aortic arch. ABDOMEN/PELVIS: Gastric antral activity is again observed, maximum SUV 7.3 (formerly 8.1), probably physiologic given the lack of obvious findings on the CT data. Normal size and activity of the spleen. Incidental CT findings: Atherosclerosis is present, including aortoiliac atherosclerotic disease. SKELETON: No significant abnormal hypermetabolic activity in this region. Incidental CT findings: none IMPRESSION: 1. New right level V adenopathy in the lower neck, dominant lymph node 1.6 cm in short axis with maximum SUV of 13.1 (Deauville 5). 2. No findings of active lymphoma in the chest, abdomen, pelvis, or included skeleton. Activity in the stomach antrum is probably physiologic. 3. Aortic Atherosclerosis (ICD10-I70.0). Electronically Signed   By: WVan ClinesM.D.   On: 07/28/2021 15:49   [place]CORE BIOPSY (SOFT TISSUE)  Result Date: 07/17/2021 INDICATION: 61year old gentleman with history of lymphoma presents to IR for biopsy of enlarging right neck mass. EXAM: Ultrasound-guided biopsy of right neck mass MEDICATIONS: None. ANESTHESIA/SEDATION: None COMPLICATIONS: None immediate. PROCEDURE: Informed written consent was obtained from the patient after a thorough discussion of the procedural risks, benefits and alternatives. All questions were addressed. Maximal Sterile Barrier Technique was utilized including caps, mask, sterile gowns, sterile gloves, sterile drape, hand hygiene and skin antiseptic. A timeout was performed prior to the initiation of the procedure. Patient position left lateral decubitus on the ultrasound table. Right neck skin prepped and draped in usual sterile fashion. Following local lidocaine administration, 4-18 gauge cores were obtained from the right neck mass utilizing continuous  ultrasound guidance. Samples were sent to pathology in sterile saline. Needle  removed and hemostasis achieved with 2 minutes of manual compression. Post procedure ultrasound images showed no evidence of significant hemorrhage. IMPRESSION: Ultrasound-guided biopsy of enlarging right neck mass. Electronically Signed   By: Miachel Roux M.D.   On: 07/17/2021 14:25     Assessment and plan- Patient is a 61 y.o. male With history of stage IV diffuse large B-cell lymphoma triple hit s/p 6 cycles of R-EPOCH chemotherapy now with relapsed/refractory disease within 12 months of initial treatment.  He is here to discuss further management  Patient did not wish to proceed with CAR-T cell therapy after reviewing risks versus benefits.We discussed alternative options for his relapsed diffuse large B-cell lymphoma at this time  Options for antibody based treatments include tafasitamab plus Revlimid.  This was based on a phase 2L mind study which reported that about 18 patients with relapsed/refractory DLBCL this combination was associated with an overall response rate of 60% and duration of response was about 22 months.  This trial did exclude patients with double history lymphoma.  After completing 12 months of treatment to facet Mab monotherapy was continued and overall survival was 34 months with a duration of response for nearly 44 months.  Tafasitamab was given on day 1, 4, 815 and 22 for cycle 1.  For cycles 2 and 3 it is given on day 1, day 8, day 15 and day 22.  Cycle 4 antibiotic is given on day 1 and day 15.  I will plan to give him the full dose of tafasitamab 12 mg/kg.  There may be potential dose delays/dose reductions given his mild pre-existing neutropenia.  The facet valve is given in combination with Revlimid at 25 mg 3 weeks on and 1 week off.  However given his baseline cytopenias I would like to start Revlimid at 15 mg.  Patient will need to take aspirin prophylaxis 81 mg daily.  Discussed risks and benefits of Revlimid including all but not limited to nausea vomiting diarrhea  and skin rash.  Discussed risks and benefits of tafasitamab including all but not limited to pancytopenia, febrile neutropenia.  Rare side effects include strokes or respiratory failure.    We also discussed alternative to facet Mab Revlimid combination which would be polatuzumab with Bendamustine Rituxan given every 3 weeks for 6 cycles.  The stool limits have not been compared to her.  Median progression free survival with the Pola BR regimen was [place] versus BR alone which was 4 months.  Overall survival 12 months  We will proceed with tafasitamab Revlimid combination at this time and see how he tolerates it.  We will we are remains on alternative if he does not tolerate the first regimen.  Treatment will be given with case curative intent.  Patient understands and agrees to proceed as planned.I will start him on prednisone 100 mg daily for 5 days starting today.  Patient will see covering MD on 08/17/2021 to start first treatment.  We will also have to obtain drug coverage for Revlimid since he does not have insurance and we will look into that.  Patient will start Revlimid with his first dose of tafasitamab   Visit Diagnosis 1. High grade B-cell lymphoma ([place])      Dr. Randa Evens, MD, MPH [facility] at [facility] [phone] 08/10/2021 10:55 AM

## 2021-08-11 NOTE — Progress Notes (Signed)
Pharmacist Chemotherapy Monitoring - Initial Assessment    Anticipated start date: 08/17/21   The following has been reviewed per standard work regarding the patient's treatment regimen: The patient's diagnosis, treatment plan and drug doses, and organ/hematologic function Lab orders and baseline tests specific to treatment regimen  The treatment plan start date, drug sequencing, and pre-medications Prior authorization status  Patient's documented medication list, including drug-drug interaction screen and prescriptions for anti-emetics and supportive care specific to the treatment regimen The drug concentrations, fluid compatibility, administration routes, and timing of the medications to be used The patient's access for treatment and lifetime cumulative dose history, if applicable  The patient's medication allergies and previous infusion related reactions, if applicable   Changes made to treatment plan:  N/A  Follow up needed:  [place], [facility], 08/11/2021  8:43 AM

## 2021-08-12 MED ORDER — LENALIDOMIDE 15 MG PO CAPS: 15.0000 mg | ORAL_CAPSULE | Freq: Every day | ORAL | 0 refills | Status: DC

## 2021-08-12 NOTE — Telephone Encounter (Signed)
Oral Oncology Patient Advocate Encounter  Received email notification from [place], [place] support specialist, that patients application has been forwarded to [facility] for final determination.  BMSPAF phone number to follow up is [phone].  [name] Phone [phone] Fax [phone] 08/12/2021 2:29 PM

## 2021-08-12 NOTE — Telephone Encounter (Signed)
Oral Oncology Patient Advocate Encounter  Met patient in [facility] lobby to complete application for BMS Access Support in an effort to reduce patient's out of pocket expense for Revlimid to $0.    Application completed and faxed to [phone] on 08/11/21.   BMS Access Support phone number for follow up is [phone].   This encounter will be updated until final determination.   [place] Patient [place] Phone [phone] Fax [phone] 08/12/2021 2:28 PM

## 2021-08-14 MED FILL — Dexamethasone Sodium Phosphate Inj 100 MG/10ML: INTRAMUSCULAR | Qty: 2 | Status: AC

## 2021-08-14 NOTE — Telephone Encounter (Signed)
Oral Oncology Patient Advocate Encounter  Received notification from [facility] that patient has been successfully enrolled into their program to receive Revlimid from the manufacturer at $0 out of pocket until 03/14/22.    Specialty Pharmacy that will dispense medication is [facility].  Patient knows to call the office with questions or concerns.   Oral Oncology Clinic will continue to follow.  [place] Patient [place] Phone [phone] Fax [phone] 08/14/2021 4:06 PM

## 2021-08-17 ENCOUNTER — Inpatient Hospital Stay: Payer: Self-pay | Admitting: Pharmacist

## 2021-08-17 ENCOUNTER — Other Ambulatory Visit (HOSPITAL_COMMUNITY): Payer: Self-pay

## 2021-08-17 ENCOUNTER — Inpatient Hospital Stay: Payer: Self-pay | Attending: Oncology

## 2021-08-17 ENCOUNTER — Inpatient Hospital Stay: Payer: Self-pay

## 2021-08-17 ENCOUNTER — Encounter: Payer: Self-pay | Admitting: Oncology

## 2021-08-17 ENCOUNTER — Inpatient Hospital Stay: Payer: Medicaid Other | Attending: Oncology | Admitting: Oncology

## 2021-08-17 VITALS — BP 156/83 | HR 60

## 2021-08-17 VITALS — BP 133/82 | HR 66 | Temp 96.3°F | Wt 185.0 lb

## 2021-08-17 DIAGNOSIS — Z8349 Family history of other endocrine, nutritional and metabolic diseases: Secondary | ICD-10-CM | POA: Insufficient documentation

## 2021-08-17 DIAGNOSIS — Z08 Encounter for follow-up examination after completed treatment for malignant neoplasm: Secondary | ICD-10-CM

## 2021-08-17 DIAGNOSIS — Z87891 Personal history of nicotine dependence: Secondary | ICD-10-CM | POA: Insufficient documentation

## 2021-08-17 DIAGNOSIS — Z7961 Long term (current) use of immunomodulator: Secondary | ICD-10-CM | POA: Insufficient documentation

## 2021-08-17 DIAGNOSIS — R590 Localized enlarged lymph nodes: Secondary | ICD-10-CM | POA: Insufficient documentation

## 2021-08-17 DIAGNOSIS — Z79899 Other long term (current) drug therapy: Secondary | ICD-10-CM | POA: Insufficient documentation

## 2021-08-17 DIAGNOSIS — Z7689 Persons encountering health services in other specified circumstances: Secondary | ICD-10-CM | POA: Insufficient documentation

## 2021-08-17 DIAGNOSIS — Z5111 Encounter for antineoplastic chemotherapy: Secondary | ICD-10-CM

## 2021-08-17 DIAGNOSIS — R110 Nausea: Secondary | ICD-10-CM | POA: Insufficient documentation

## 2021-08-17 DIAGNOSIS — D709 Neutropenia, unspecified: Secondary | ICD-10-CM | POA: Insufficient documentation

## 2021-08-17 DIAGNOSIS — Z7189 Other specified counseling: Secondary | ICD-10-CM

## 2021-08-17 DIAGNOSIS — C8510 Unspecified B-cell lymphoma, unspecified site: Secondary | ICD-10-CM

## 2021-08-17 DIAGNOSIS — I10 Essential (primary) hypertension: Secondary | ICD-10-CM | POA: Insufficient documentation

## 2021-08-17 DIAGNOSIS — Z7952 Long term (current) use of systemic steroids: Secondary | ICD-10-CM | POA: Insufficient documentation

## 2021-08-17 DIAGNOSIS — Z809 Family history of malignant neoplasm, unspecified: Secondary | ICD-10-CM | POA: Insufficient documentation

## 2021-08-17 DIAGNOSIS — C8333 Diffuse large B-cell lymphoma, intra-abdominal lymph nodes: Secondary | ICD-10-CM | POA: Insufficient documentation

## 2021-08-17 DIAGNOSIS — I700 Atherosclerosis of aorta: Secondary | ICD-10-CM | POA: Insufficient documentation

## 2021-08-17 DIAGNOSIS — Z8249 Family history of ischemic heart disease and other diseases of the circulatory system: Secondary | ICD-10-CM | POA: Insufficient documentation

## 2021-08-17 DIAGNOSIS — Z8269 Family history of other diseases of the musculoskeletal system and connective tissue: Secondary | ICD-10-CM | POA: Insufficient documentation

## 2021-08-17 DIAGNOSIS — Z5112 Encounter for antineoplastic immunotherapy: Secondary | ICD-10-CM | POA: Insufficient documentation

## 2021-08-17 DIAGNOSIS — Z832 Family history of diseases of the blood and blood-forming organs and certain disorders involving the immune mechanism: Secondary | ICD-10-CM | POA: Insufficient documentation

## 2021-08-17 DIAGNOSIS — C8331 Diffuse large B-cell lymphoma, lymph nodes of head, face, and neck: Secondary | ICD-10-CM | POA: Insufficient documentation

## 2021-08-17 LAB — CBC WITH DIFFERENTIAL/PLATELET
Abs Immature Granulocytes: 0.06 10*3/uL (ref 0.00–0.07)
Basophils Absolute: 0 10*3/uL (ref 0.0–0.1)
Basophils Relative: 0 %
Eosinophils Absolute: 0.1 10*3/uL (ref 0.0–0.5)
Eosinophils Relative: 1 %
HCT: 39.2 % (ref 39.0–52.0)
Hemoglobin: 13.6 g/dL (ref 13.0–17.0)
Immature Granulocytes: 1 %
Lymphocytes Relative: 26 %
Lymphs Abs: 1.4 10*3/uL (ref 0.7–4.0)
MCH: 31.6 pg (ref 26.0–34.0)
MCHC: 34.7 g/dL (ref 30.0–36.0)
MCV: 91.2 fL (ref 80.0–100.0)
Monocytes Absolute: 0.5 10*3/uL (ref 0.1–1.0)
Monocytes Relative: 9 %
Neutro Abs: 3.5 10*3/uL (ref 1.7–7.7)
Neutrophils Relative %: 63 %
Platelets: 214 10*3/uL (ref 150–400)
RBC: 4.3 MIL/uL (ref 4.22–5.81)
RDW: 14.3 % (ref 11.5–15.5)
WBC: 5.6 10*3/uL (ref 4.0–10.5)
nRBC: 0 % (ref 0.0–0.2)

## 2021-08-17 LAB — COMPREHENSIVE METABOLIC PANEL
ALT: 41 U/L (ref 0–44)
AST: 34 U/L (ref 15–41)
Albumin: 3.7 g/dL (ref 3.5–5.0)
Alkaline Phosphatase: 75 U/L (ref 38–126)
Anion gap: 6 (ref 5–15)
BUN: 18 mg/dL (ref 8–23)
CO2: 29 mmol/L (ref 22–32)
Calcium: 8.7 mg/dL — ABNORMAL LOW (ref 8.9–10.3)
Chloride: 103 mmol/L (ref 98–111)
Creatinine, Ser: 0.93 mg/dL (ref 0.61–1.24)
GFR, Estimated: >60 mL/min (ref >60)
Glucose, Bld: 143 mg/dL — ABNORMAL HIGH (ref 70–99)
Potassium: 3.7 mmol/L (ref 3.5–5.1)
Sodium: 138 mmol/L (ref 135–145)
Total Bilirubin: 0.4 mg/dL (ref 0.3–1.2)
Total Protein: 6.4 g/dL — ABNORMAL LOW (ref 6.5–8.1)

## 2021-08-17 LAB — LACTATE DEHYDROGENASE: LDH: 130 U/L (ref 98–192)

## 2021-08-17 MED ORDER — ACETAMINOPHEN 325 MG PO TABS
650.0000 mg | ORAL_TABLET | Freq: Once | ORAL | Status: AC
  Administered 2021-08-17: 650 mg via ORAL

## 2021-08-17 MED ORDER — TAFASITAMAB-CXIX CHEMO INJECTION 200 MG
12.0000 mg/kg | Freq: Once | INTRAVENOUS | Status: AC
  Administered 2021-08-17: 1000 mg via INTRAVENOUS
  Filled 2021-08-17: qty 25

## 2021-08-17 MED ORDER — HEPARIN SOD (PORK) LOCK FLUSH 100 UNIT/ML IV SOLN
500.0000 [IU] | Freq: Once | INTRAVENOUS | Status: AC | PRN
  Administered 2021-08-17: 500 [IU]
  Filled 2021-08-17: qty 5

## 2021-08-17 MED ORDER — DIPHENHYDRAMINE HCL 50 MG/ML IJ SOLN
50.0000 mg | Freq: Once | INTRAMUSCULAR | Status: AC
  Administered 2021-08-17: 50 mg via INTRAVENOUS

## 2021-08-17 MED ORDER — ONDANSETRON HCL 8 MG PO TABS
8.0000 mg | ORAL_TABLET | Freq: Two times a day (BID) | ORAL | 1 refills | Status: DC | PRN
  Filled 2021-08-17: qty 30, 15d supply, fill #0

## 2021-08-17 MED ORDER — FAMOTIDINE IN NACL 20-0.9 MG/50ML-% IV SOLN
20.0000 mg | Freq: Once | INTRAVENOUS | Status: AC
  Administered 2021-08-17: 20 mg via INTRAVENOUS

## 2021-08-17 MED ORDER — SODIUM CHLORIDE 0.9 % IV SOLN
Freq: Once | INTRAVENOUS | Status: AC
  Filled 2021-08-17: qty 250

## 2021-08-17 MED ORDER — SODIUM CHLORIDE 0.9 % IV SOLN
20.0000 mg | Freq: Once | INTRAVENOUS | Status: AC
  Administered 2021-08-17: 20 mg via INTRAVENOUS
  Filled 2021-08-17: qty 20

## 2021-08-17 MED ORDER — ASPIRIN 81 MG PO TBEC: 81.0000 mg | DELAYED_RELEASE_TABLET | Freq: Every day | ORAL | 0 refills | Status: DC

## 2021-08-17 NOTE — Patient Instructions (Signed)
[facility] AT [facility]  Discharge Instructions: Thank you for choosing [facility] to provide your oncology and hematology care.  If you have a lab appointment with the [facility], please go directly to the [facility] and check in at the registration area.  Wear comfortable clothing and clothing appropriate for easy access to any Portacath or PICC line.   We strive to give you quality time with your provider. You may need to reschedule your appointment if you arrive late (15 or more minutes).  Arriving late affects you and other patients whose appointments are after yours.  Also, if you miss three or more appointments without notifying the office, you may be dismissed from the clinic at the provider's discretion.      For prescription refill requests, have your pharmacy contact our office and allow 72 hours for refills to be completed.    Today you received the following chemotherapy and/or immunotherapy agents: Tafasitamab-cxix      To help prevent nausea and vomiting after your treatment, we encourage you to take your nausea medication as directed.  BELOW ARE SYMPTOMS THAT SHOULD BE REPORTED IMMEDIATELY: *FEVER GREATER THAN 100.4 F (38 C) OR HIGHER *CHILLS OR SWEATING *NAUSEA AND VOMITING THAT IS NOT CONTROLLED WITH YOUR NAUSEA MEDICATION *UNUSUAL SHORTNESS OF BREATH *UNUSUAL BRUISING OR BLEEDING *URINARY PROBLEMS (pain or burning when urinating, or frequent urination) *BOWEL PROBLEMS (unusual diarrhea, constipation, pain near the anus) TENDERNESS IN MOUTH AND THROAT WITH OR WITHOUT PRESENCE OF ULCERS (sore throat, sores in mouth, or a toothache) UNUSUAL RASH, SWELLING OR PAIN  UNUSUAL VAGINAL DISCHARGE OR ITCHING   Items with * indicate a potential emergency and should be followed up as soon as possible or go to the Emergency Department if any problems should occur.  Please show the CHEMOTHERAPY ALERT CARD or IMMUNOTHERAPY ALERT CARD at  check-in to the Emergency Department and triage nurse.  Should you have questions after your visit or need to cancel or reschedule your appointment, please contact [facility] [place] [facility] [place]  [phone] and follow the prompts.  Office hours are 8:00 a.m. to 4:30 p.m. Monday - Friday. Please note that voicemails left after 4:00 p.m. may not be returned until the following business day.  We are closed weekends and major holidays. You have access to a nurse at all times for urgent questions. Please call the main number to the clinic [phone] and follow the prompts.  For any non-urgent questions, you may also contact your provider using MyChart. We now offer e-Visits for anyone 62 and older to request care online for non-urgent symptoms. For details visit [email or web address].   Also download the MyChart app! Go to the app store, search "MyChart", open the app, select [facility], and log in with your MyChart username and password.  Due to Covid, a mask is required upon entering the hospital/clinic. If you do not have a mask, one will be given to you upon arrival. For doctor visits, patients may have 1 support person aged 46 or older with them. For treatment visits, patients cannot have anyone with them due to current Covid guidelines and our immunocompromised population.

## 2021-08-17 NOTE — Progress Notes (Signed)
Hematology/Oncology Progress note [phone] [phone]     Patient Care Team: [name], MD as PCP - General (Internal Medicine) Sindy Guadeloupe, MD as Consulting Physician (Hematology and Oncology) Jules Husbands, MD as Consulting Physician (General Surgery)   Name of the patient: Christopher Mills  [id number]  09/28/1960   REASON FOR VISIT Chemotherapy evaluation  INTERVAL HISTORY Patient follows up with Dr.Rao who is off today, I am covering to see this patient.  Extensive medical records review was performed by me. 61 y.o. 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  Patient has previously finished 1 cycle of R-CHOP followed by dose adjusted EPOCH with 3 cycles of intrathecal methotrexate..   07/28/2021, PET scan restaging showed new right level 4 adenopathy in the lower neck, dominant lymph node 1.6 cm in short axis with maximum SUV of 13.1, Dubiel 5.  No findings of active lymphoma in the chest abdomen pelvis or included skeleton.  Activity in the stomach antrum is probably physiological.  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 Dr. 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 Today patient denies nausea vomiting diarrhea, fever or chills.  He has not received the Revlimid supply yet.  He has met oral chemotherapy pharmacist Alyson.   Review of Systems  Constitutional:  Negative for appetite change, chills, fatigue, fever and unexpected weight change.  HENT:   Negative for hearing loss and voice change.   Eyes:  Negative for eye problems and icterus.  Respiratory:  Negative for chest tightness, cough and shortness of breath.   Cardiovascular:  Negative for chest pain and leg swelling.   Gastrointestinal:  Negative for abdominal distention and abdominal pain.  Endocrine: Negative for hot flashes.  Genitourinary:  Negative for difficulty urinating, dysuria and frequency.   Musculoskeletal:  Negative for arthralgias.  Skin:  Negative for itching and rash.  Neurological:  Negative for light-headedness and numbness.  Hematological:  Negative for adenopathy. Does not bruise/bleed easily.  Psychiatric/Behavioral:  Negative for confusion.      No Known Allergies   Past Medical History:  Diagnosis Date   Acute kidney injury ([place]) 07/12/2020   Dyspnea    Hypertension    Lymphoma of lymph nodes of neck ([place]) 06/18/2020     Past Surgical History:  Procedure Laterality Date   BONE MARROW BIOPSY     COLONOSCOPY     EXCISION MASS NECK Right 06/12/2020   Procedure: EXCISION MASS NECK;  Surgeon: Jules Husbands, MD;  Location: [facility] ORS;  Service: General;  Laterality: Right;   HERNIA REPAIR Right    at age 53-RIH   PORTA [place] Right 06/24/2020   Procedure: INSERTION PORT-A-CATH;  Surgeon: Jules Husbands, MD;  Location: [facility] ORS;  Service: General;  Laterality: Right;    Social History   Socioeconomic History   Marital status: Single    Spouse name: Not on file   Number of children: Not on file   Years of education: Not on file   Highest education level: Not on file  Occupational History   Not on file  Tobacco Use   Smoking status: Former    Types: Cigarettes    Quit date: 06/13/2020    Years since  quitting: 1.1   Smokeless tobacco: Never  Vaping Use   Vaping Use: Never used  Substance and Sexual Activity   Alcohol use: Not Currently   Drug use: Not Currently    Types: Marijuana   Sexual activity: Not Currently  Other Topics Concern   Not on file  Social History Narrative   Has daughter and grandson in the home   Social Determinants of Health   Financial Resource Strain: Not on file  Food Insecurity: Not on file   Transportation Needs: Not on file  Physical Activity: Not on file  Stress: Not on file  Social Connections: Not on file  Intimate Partner Violence: Not on file    Family History  Problem Relation Age of Onset   Anemia Mother    Hypertension Mother    Goiter Mother    Cancer Father    Cancer Sister    Multiple sclerosis Sister      Current Outpatient Medications:    allopurinol (ZYLOPRIM) 300 MG tablet, Take 1 tablet (300 mg total) by mouth daily., Disp: 30 tablet, Rfl: 1   aspirin EC 81 MG tablet, Take 1 tablet (81 mg total) by mouth daily. Swallow whole., Disp: 30 tablet, Rfl: 0   atenolol (TENORMIN) 25 MG tablet, Take 25 mg by mouth daily., Disp: , Rfl:    cholecalciferol (VITAMIN D3) 25 MCG (1000 UNIT) tablet, Take 1,000 Units by mouth daily., Disp: , Rfl:    lenalidomide (REVLIMID) 15 MG capsule, Take 1 capsule (15 mg total) by mouth daily. Take for 21 days, then hold for 7 days. Repeat every 28 days., Disp: 21 capsule, Rfl: 0   lidocaine-prilocaine (EMLA) cream, Apply to affected area once, Disp: 30 g, Rfl: 3   lisinopril (ZESTRIL) 10 MG tablet, Take 10 mg by mouth daily., Disp: , Rfl:    Omega-3 Fatty Acids (FISH OIL ADULT GUMMIES PO), Take by mouth., Disp: , Rfl:    predniSONE (DELTASONE) 20 MG tablet, Take 1 tablet (20 mg total) by mouth daily with breakfast. Take 3 tablets daily in am after eating for 5 days, Disp: 15 tablet, Rfl: 0   prochlorperazine (COMPAZINE) 10 MG tablet, Take 1 tablet (10 mg total) by mouth every 6 (six) hours as needed (Nausea or vomiting)., Disp: 30 tablet, Rfl: 1   vitamin B-12 (CYANOCOBALAMIN) 100 MCG tablet, Take 100 mcg by mouth daily., Disp: , Rfl:    vitamin C (ASCORBIC ACID) 500 MG tablet, Take 500 mg by mouth daily., Disp: , Rfl:    VITAMIN D PO, Take 1 capsule by mouth daily., Disp: , Rfl:    ondansetron (ZOFRAN) 8 MG tablet, Take 1 tablet (8 mg total) by mouth 2 (two) times daily as needed (Nausea or vomiting)., Disp: 30 tablet, Rfl: 1 No  current facility-administered medications for this visit.  Facility-Administered Medications Ordered in Other Visits:    0.9 %  sodium chloride infusion, , Intravenous, Continuous, Sindy Guadeloupe, MD, Stopped at 09/09/20 1507   0.9 %  sodium chloride infusion, , Intravenous, Continuous, Cammie Sickle, MD, Stopped at 09/11/20 1502   0.9 %  sodium chloride infusion, , Intravenous, Continuous, Sindy Guadeloupe, MD, Stopped at 11/06/20 1424   heparin lock flush 100 unit/mL, 500 Units, Intravenous, Once, Sindy Guadeloupe, MD   methotrexate (PF) 12 mg in sodium chloride (PF) 0.9 % INTRATHECAL chemo injection, , Intrathecal, Once, Sindy Guadeloupe, MD   sodium chloride flush (NS) 0.9 % injection 10 mL, 10 mL, Intravenous, PRN,  Sindy Guadeloupe, MD, 10 mL at 08/13/20 1510   sodium chloride flush (NS) 0.9 % injection 10 mL, 10 mL, Intravenous, PRN, Sindy Guadeloupe, MD, 10 mL at 11/04/20 1408   sodium chloride flush (NS) 0.9 % injection 10 mL, 10 mL, Intravenous, PRN, Sindy Guadeloupe, MD, 10 mL at 12/02/20 1505  Physical exam:  Vitals:   08/17/21 0901  BP: 133/82  Pulse: 66  Temp: (!) 96.3 F (35.7 C)  TempSrc: Tympanic  Weight: 185 lb (83.9 kg)   Physical Exam Constitutional:      General: He is not in acute distress. HENT:     Head: Normocephalic and atraumatic.  Eyes:     General: No scleral icterus. Neck:     Comments: Palpable right supra clavicle lymphadenopathy Cardiovascular:     Rate and Rhythm: Normal rate and regular rhythm.     Heart sounds: Normal heart sounds.  Pulmonary:     Effort: Pulmonary effort is normal. No respiratory distress.     Breath sounds: No wheezing.  Abdominal:     General: Bowel sounds are normal. There is no distension.     Palpations: Abdomen is soft.  Musculoskeletal:        General: No deformity. Normal range of motion.     Cervical back: Normal range of motion and neck supple.  Lymphadenopathy:     Cervical: Cervical adenopathy present.  Skin:     General: Skin is warm and dry.     Findings: No erythema or rash.  Neurological:     Mental Status: He is alert and oriented to person, place, and time. Mental status is at baseline.     Cranial Nerves: No cranial nerve deficit.     Coordination: Coordination normal.  Psychiatric:        Mood and Affect: Mood normal.       Latest Ref Rng & Units 08/17/2021    8:47 AM  CMP  Glucose 70 - 99 mg/dL 143    BUN 8 - 23 mg/dL 18    Creatinine 0.61 - 1.24 mg/dL 0.93    Sodium 135 - 145 mmol/L 138    Potassium 3.5 - 5.1 mmol/L 3.7    Chloride 98 - 111 mmol/L 103    CO2 22 - 32 mmol/L 29    Calcium 8.9 - 10.3 mg/dL 8.7    Total Protein 6.5 - 8.1 g/dL 6.4    Total Bilirubin 0.3 - 1.2 mg/dL 0.4    Alkaline Phos 38 - 126 U/L 75    AST 15 - 41 U/L 34    ALT 0 - 44 U/L 41        Latest Ref Rng & Units 08/17/2021    8:47 AM  CBC  WBC 4.0 - 10.5 K/uL 5.6    Hemoglobin 13.0 - 17.0 g/dL 13.6    Hematocrit 39.0 - 52.0 % 39.2    Platelets 150 - 400 K/uL 214      RADIOGRAPHIC STUDIES: I have personally reviewed the radiological images as listed and agreed with the findings in the report. MR Brain W Wo Contrast  Result Date: 07/27/2021 CLINICAL DATA:  Lymphoma EXAM: MRI HEAD WITHOUT AND WITH CONTRAST TECHNIQUE: Multiplanar, multiecho pulse sequences of the brain and surrounding structures were obtained without and with intravenous contrast. CONTRAST:  7.9m GADAVIST GADOBUTROL 1 MMOL/ML IV SOLN COMPARISON:  None Available. FINDINGS: Brain: There is no acute infarction or intracranial hemorrhage. There is no intracranial mass, mass effect, or  edema. There is no hydrocephalus or extra-axial fluid collection. Ventricles and sulci are normal in size and configuration. No abnormal enhancement. Vascular: Major vessel flow voids at the skull base are preserved. Skull and upper cervical spine: Normal marrow signal is preserved. Sinuses/Orbits: Minor mucosal thickening.  Orbits are unremarkable. Other: Sella  is unremarkable.  Mastoid air cells are clear. IMPRESSION: No evidence of metastatic disease or other significant intracranial abnormality. Electronically Signed   By: Macy Mis M.D.   On: 07/27/2021 15:00   NM PET Image Restag (PS) Skull Base To Thigh  Result Date: 07/28/2021 CLINICAL DATA:  Subsequent treatment strategy for diffuse large B-cell lymphoma. EXAM: NUCLEAR MEDICINE PET SKULL BASE TO THIGH TECHNIQUE: 10.7 mCi F-18 FDG was injected intravenously. Full-ring PET imaging was performed from the skull base to thigh after the radiotracer. CT data was obtained and used for attenuation correction and anatomic localization. Fasting blood glucose: 100 mg/dl COMPARISON:  03/17/2021 PET-CT FINDINGS: Mediastinal blood pool activity: SUV max 1.9 Liver activity: SUV max 2.8 NECK: New right level V adenopathy observed with the dominant node measuring 1.6 cm in short axis on image 54 of series 2 with maximum SUV 13.1 (Deauville 5). Incidental CT findings: none CHEST: Indistinct density compatible with post therapy related findings in the right paratracheal region, maximum SUV 1.9 (Deauville 2). Incidental CT findings: Right Port-A-Cath tip: SVC. Mild atherosclerotic calcification of the aortic arch. ABDOMEN/PELVIS: Gastric antral activity is again observed, maximum SUV 7.3 (formerly 8.1), probably physiologic given the lack of obvious findings on the CT data. Normal size and activity of the spleen. Incidental CT findings: Atherosclerosis is present, including aortoiliac atherosclerotic disease. SKELETON: No significant abnormal hypermetabolic activity in this region. Incidental CT findings: none IMPRESSION: 1. New right level V adenopathy in the lower neck, dominant lymph node 1.6 cm in short axis with maximum SUV of 13.1 (Deauville 5). 2. No findings of active lymphoma in the chest, abdomen, pelvis, or included skeleton. Activity in the stomach antrum is probably physiologic. 3. Aortic Atherosclerosis  (ICD10-I70.0). Electronically Signed   By: Van Clines M.D.   On: 07/28/2021 15:49     Assessment and plan  1. Diffuse large B-cell lymphoma of lymph nodes of neck (HCC)   2. Encounter for antineoplastic chemotherapy   3. Goals of care, counseling/discussion    #Relapsed/Refractory diffuse large B-cell lymphoma-Cervical lymphadenopathy  Labs reviewed and discussed with patient.  Proceed with second line treatment Tafasitamab plus Revlimid 12m daily D1-21 Rationale and potential side effects were reviewed and discussed with patient.  He agrees with the plan. Patient will come back on day 4, day 8 for treatments.  Discussed with Dr. RElroy Channelteam RN.Follow-up plan, 08/31/2021, lab MD/NP/ D15 Tafasitamab I recommend patient to start on Aspirin 81 mg daily.  He has over-the-counter supplies   ZEarlie Server MD, PhD [facility] Hematology Oncology 08/17/2021

## 2021-08-17 NOTE — Patient Instructions (Signed)
[facility] AT [place]  Discharge Instructions: Thank you for choosing [facility] to provide your oncology and hematology care.  If you have a lab appointment with the [facility], please go directly to the [facility] and check in at the registration area.  Wear comfortable clothing and clothing appropriate for easy access to any Portacath or PICC line.   We strive to give you quality time with your provider. You may need to reschedule your appointment if you arrive late (15 or more minutes).  Arriving late affects you and other patients whose appointments are after yours.  Also, if you miss three or more appointments without notifying the office, you may be dismissed from the clinic at the provider's discretion.      For prescription refill requests, have your pharmacy contact our office and allow 72 hours for refills to be completed.    Today you received the following chemotherapy and/or immunotherapy agents: Monjuvi      To help prevent nausea and vomiting after your treatment, we encourage you to take your nausea medication as directed.  BELOW ARE SYMPTOMS THAT SHOULD BE REPORTED IMMEDIATELY: *FEVER GREATER THAN 100.4 F (38 C) OR HIGHER *CHILLS OR SWEATING *NAUSEA AND VOMITING THAT IS NOT CONTROLLED WITH YOUR NAUSEA MEDICATION *UNUSUAL SHORTNESS OF BREATH *UNUSUAL BRUISING OR BLEEDING *URINARY PROBLEMS (pain or burning when urinating, or frequent urination) *BOWEL PROBLEMS (unusual diarrhea, constipation, pain near the anus) TENDERNESS IN MOUTH AND THROAT WITH OR WITHOUT PRESENCE OF ULCERS (sore throat, sores in mouth, or a toothache) UNUSUAL RASH, SWELLING OR PAIN  UNUSUAL VAGINAL DISCHARGE OR ITCHING   Items with * indicate a potential emergency and should be followed up as soon as possible or go to the Emergency Department if any problems should occur.  Please show the CHEMOTHERAPY ALERT CARD or IMMUNOTHERAPY ALERT CARD at check-in to  the Emergency Department and triage nurse.  Should you have questions after your visit or need to cancel or reschedule your appointment, please contact [facility] CANCER [place] AT [place]  [phone] and follow the prompts.  Office hours are 8:00 a.m. to 4:30 p.m. Monday - Friday. Please note that voicemails left after 4:00 p.m. may not be returned until the following business day.  We are closed weekends and major holidays. You have access to a nurse at all times for urgent questions. Please call the main number to the clinic [phone] and follow the prompts.  For any non-urgent questions, you may also contact your provider using MyChart. We now offer e-Visits for anyone 31 and older to request care online for non-urgent symptoms. For details visit [email or web address].   Also download the MyChart app! Go to the app store, search "MyChart", open the app, select [facility], and log in with your MyChart username and password.  Due to Covid, a mask is required upon entering the hospital/clinic. If you do not have a mask, one will be given to you upon arrival. For doctor visits, patients may have 1 support person aged 36 or older with them. For treatment visits, patients cannot have anyone with them due to current Covid guidelines and our immunocompromised population.

## 2021-08-17 NOTE — Progress Notes (Signed)
[facility]  Telephone:([phone] [phone]  Patient Care Team: [name], MD as PCP - General (Internal Medicine) Sindy Guadeloupe, MD as Consulting Physician (Hematology and Oncology) Jules Husbands, MD as Consulting Physician (General Surgery)   Name of the patient: Christopher Mills  [id number]  Dec 17, 1960   Date of visit: 08/17/21  HPI: Patient is a 61 y.o. 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 Planned treatment with Revlimid (lenalidomide) and tafasitamab. He will get started on his tafasitamab today 08/17/21. He still needs to call to set-up deliver of his lenalidomide, he will start this once he has it in hand..   Reason for Consult: Lenalidomide oral chemotherapy education.   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[facility])    06/18/2020 Cancer Staging   Staging form: Hodgkin and Non-Hodgkin Lymphoma, AJCC 8th Edition - Clinical stage from 06/18/2020: Stage IV (Diffuse large B-cell lymphoma) - Signed by Sindy Guadeloupe, MD on 06/18/2020 Stage prefix: Initial diagnosis    06/21/2020 - 06/21/2020 Chemotherapy          07/03/2020 - 07/08/2020 Chemotherapy          08/12/2020 - 12/05/2020 Chemotherapy   Patient is on Treatment Plan :  NON-HODGKINS LYMPHOMA R-EPOCH q21d      08/17/2021 -  Chemotherapy   Patient is on Treatment Plan : NON-HODGKIN'S LYMPHOMA DLBCL RELAPSED/REFRACTORY Tafasitamab-cxix + Lenalidomide q28d / Tafasitamab-cxix Maintenance q28d        ALLERGIES:  has No Known Allergies.  MEDICATIONS:  Current Outpatient Medications  Medication Sig Dispense Refill   allopurinol (ZYLOPRIM) 300 MG tablet Take 1 tablet (300 mg total) by mouth  daily. 30 tablet 1   atenolol (TENORMIN) 25 MG tablet Take 25 mg by mouth daily.     cholecalciferol (VITAMIN D3) 25 MCG (1000 UNIT) tablet Take 1,000 Units by mouth daily.     lenalidomide (REVLIMID) 15 MG capsule Take 1 capsule (15 mg total) by mouth daily. Take for 21 days, then hold for 7 days. Repeat every 28 days. 21 capsule 0   lidocaine-prilocaine (EMLA) cream Apply to affected area once 30 g 3   lisinopril (ZESTRIL) 10 MG tablet Take 10 mg by mouth daily.     Omega-3 Fatty Acids (FISH OIL ADULT GUMMIES PO) Take by mouth.     ondansetron (ZOFRAN) 8 MG tablet Take 1 tablet (8 mg total) by mouth 2 (two) times daily as needed (Nausea or vomiting). 30 tablet 1   predniSONE (DELTASONE) 20 MG tablet Take 1 tablet (20 mg total) by mouth daily with breakfast. Take 3 tablets daily in am after eating for 5 days 15 tablet 0   prochlorperazine (COMPAZINE) 10 MG tablet Take 1 tablet (10 mg total) by mouth every 6 (six) hours as needed (Nausea or vomiting). 30 tablet 1   vitamin B-12 (CYANOCOBALAMIN) 100 MCG tablet Take 100 mcg by mouth daily.     vitamin C (ASCORBIC ACID) 500 MG tablet Take 500 mg by mouth daily.     VITAMIN D PO Take 1 capsule by mouth daily.     No current facility-administered medications for this visit.   Facility-Administered Medications Ordered in Other Visits  Medication Dose Route Frequency Provider Last Rate Last Admin   0.9 %  sodium chloride infusion   Intravenous Continuous Sindy Guadeloupe, MD   Stopped at 09/09/20 1507   0.9 %  sodium chloride infusion   Intravenous Continuous Cammie Sickle, MD   Stopped at 09/11/20 1502   0.9 %  sodium chloride infusion   Intravenous Continuous Sindy Guadeloupe, MD   Stopped at 11/06/20 1424   heparin lock flush 100 unit/mL  500 Units Intravenous Once Sindy Guadeloupe, MD       methotrexate (PF) 12 mg in sodium chloride (PF) 0.9 % INTRATHECAL chemo injection   Intrathecal Once Sindy Guadeloupe, MD       sodium chloride flush (NS) 0.9  % injection 10 mL  10 mL Intravenous PRN Sindy Guadeloupe, MD   10 mL at 08/13/20 1510   sodium chloride flush (NS) 0.9 % injection 10 mL  10 mL Intravenous PRN Sindy Guadeloupe, MD   10 mL at 11/04/20 1408   sodium chloride flush (NS) 0.9 % injection 10 mL  10 mL Intravenous PRN Sindy Guadeloupe, MD   10 mL at 12/02/20 1505    VITAL SIGNS: There were no vitals taken for this visit. There were no vitals filed for this visit.  Estimated body mass index is 24.41 kg/m as calculated from the following:   Height as of 03/18/21: '6\' 1"'$  (1.854 m).   Weight as of an earlier encounter on 08/17/21: 83.9 kg (185 lb).  LABS: CBC:    Component Value Date/Time   WBC 4.4 07/29/2021 1444   HGB 13.5 07/29/2021 1444   HGB 13.1 07/03/2020 0935   HCT 37.8 (L) 07/29/2021 1444   HCT 40.1 02/19/2013 1716   PLT 212 07/29/2021 1444   PLT 274 02/19/2013 1716   MCV 89.4 07/29/2021 1444   MCV 86 02/19/2013 1716   NEUTROABS 1.5 (L) 07/29/2021 1444   LYMPHSABS 2.4 07/29/2021 1444   MONOABS 0.4 07/29/2021 1444   EOSABS 0.0 07/29/2021 1444   BASOSABS 0.0 07/29/2021 1444   Comprehensive Metabolic Panel:    Component Value Date/Time   NA 138 08/17/2021 0847   NA 135 (L) 02/19/2013 1716   K 3.7 08/17/2021 0847   K 3.5 02/19/2013 1716   CL 103 08/17/2021 0847   CL 102 02/19/2013 1716   CO2 29 08/17/2021 0847   CO2 27 02/19/2013 1716   BUN 18 08/17/2021 0847   BUN 16 02/19/2013 1716   CREATININE 0.93 08/17/2021 0847   CREATININE 1.12 02/19/2013 1716   GLUCOSE 143 (H) 08/17/2021 0847   GLUCOSE 94 02/19/2013 1716   CALCIUM 8.7 (L) 08/17/2021 0847   CALCIUM 9.6 02/19/2013 1716   AST 34 08/17/2021 0847   AST 33 02/19/2013 1716   ALT 41 08/17/2021 0847   ALT 41 02/19/2013 1716   ALKPHOS 75 08/17/2021 0847   ALKPHOS 79 02/19/2013 1716   BILITOT 0.4 08/17/2021 0847   BILITOT 0.4 02/19/2013 1716   PROT 6.4 (L) 08/17/2021 0847   PROT 7.9 02/19/2013 1716   ALBUMIN 3.7 08/17/2021 0847   ALBUMIN 4.2 02/19/2013  1716     Present during today's visit: patient only  Start plan: Once his lenaliomide is delivered, he will get started   Patient Education I spoke with patient for overview of new oral chemotherapy medication: lenalidomide   Administration: Counseled patient on administration, dosing, side effects, monitoring, drug-food interactions, safe handling, storage, and disposal. Patient will take  1 capsule (15 mg total) by mouth daily. Take for 21 days, then hold for 7 days. Repeat every 28 days.  *Patient was also instructed to begin taking aspirin '81mg'$   Side Effects: Side effects include but not limited to: rash/itchy skin, N/V, fatigue, decreased wbc/hgb/plt, constipation or diarrhea Rash: he knows to call to report rash or itchy skin if it occurs Nausea: patient is uninsured an unable to fill the ondansetron prescription sent to his local pharmacy, prescription redirected to [facility] to use discounted drug pricing Diarrhea: patient knows to use loperamide as needed to diarrhea, and call the office if he is having 4 or more loose stools Constipation: instructed the patient to call me if he is having constipation to discuss OTC options  Drug-drug Interactions (DDI): No current DDIs with lenalidomide  Adherence: After discussion with patient no patient barriers to medication adherence identified.  Reviewed with patient importance of keeping a medication schedule and plan for any missed doses.  Mr. Fedak voiced understanding and appreciation. All questions answered. Medication handout provided.  Provided patient with [facility] phone number. Patient knows to call the office with questions or concerns. [facility] will continue to follow.  Patient expressed understanding and was in agreement with this plan. He also understands that He can call clinic at any time with any questions, concerns, or complaints.   Medication  Access Issues: Patient was approved for manufacturer assistance , he just needs to call to set-up medication delivery  Follow-up plan: Return to pharm clinic on 619  Thank you for allowing me to participate in the care of this patient.   Time Total: 20 mins  Visit consisted of counseling and education on dealing with issues of symptom management in the setting of serious and potentially life-threatening illness.Greater than 50%  of this time was spent counseling and coordinating care related to the above assessment and plan.  Signed by: Darl Pikes, PharmD, BCPS, Salley Slaughter, CPP Hematology/Oncology Clinical Pharmacist Practitioner [facility]/DB/AP [facility] [phone]  08/17/2021 9:42 AM

## 2021-08-19 MED FILL — Dexamethasone Sodium Phosphate Inj 100 MG/10ML: INTRAMUSCULAR | Qty: 2 | Status: AC

## 2021-08-20 ENCOUNTER — Inpatient Hospital Stay: Payer: Self-pay

## 2021-08-20 VITALS — BP 137/81 | HR 60 | Temp 96.1°F | Resp 18 | Ht 73.0 in | Wt 186.7 lb

## 2021-08-20 DIAGNOSIS — C8510 Unspecified B-cell lymphoma, unspecified site: Secondary | ICD-10-CM

## 2021-08-20 MED ORDER — SODIUM CHLORIDE 0.9% FLUSH
10.0000 mL | INTRAVENOUS | Status: DC | PRN
  Filled 2021-08-20: qty 10

## 2021-08-20 MED ORDER — HEPARIN SOD (PORK) LOCK FLUSH 100 UNIT/ML IV SOLN
500.0000 [IU] | Freq: Once | INTRAVENOUS | Status: DC | PRN
  Filled 2021-08-20: qty 5

## 2021-08-20 MED ORDER — DIPHENHYDRAMINE HCL 50 MG/ML IJ SOLN
50.0000 mg | Freq: Once | INTRAMUSCULAR | Status: AC
  Administered 2021-08-20: 50 mg via INTRAVENOUS
  Filled 2021-08-20: qty 1

## 2021-08-20 MED ORDER — TAFASITAMAB-CXIX CHEMO INJECTION 200 MG
12.0000 mg/kg | Freq: Once | INTRAVENOUS | Status: AC
  Administered 2021-08-20: 1000 mg via INTRAVENOUS
  Filled 2021-08-20: qty 25

## 2021-08-20 MED ORDER — FAMOTIDINE IN NACL 20-0.9 MG/50ML-% IV SOLN
20.0000 mg | Freq: Once | INTRAVENOUS | Status: AC
  Administered 2021-08-20: 20 mg via INTRAVENOUS
  Filled 2021-08-20: qty 50

## 2021-08-20 MED ORDER — ACETAMINOPHEN 325 MG PO TABS
650.0000 mg | ORAL_TABLET | Freq: Once | ORAL | Status: AC
  Administered 2021-08-20: 650 mg via ORAL
  Filled 2021-08-20: qty 2

## 2021-08-20 MED ORDER — SODIUM CHLORIDE 0.9 % IV SOLN
Freq: Once | INTRAVENOUS | Status: AC
  Filled 2021-08-20: qty 250

## 2021-08-20 MED ORDER — SODIUM CHLORIDE 0.9 % IV SOLN
20.0000 mg | Freq: Once | INTRAVENOUS | Status: AC
  Administered 2021-08-20: 20 mg via INTRAVENOUS
  Filled 2021-08-20: qty 20

## 2021-08-20 NOTE — Patient Instructions (Signed)
[facility] CANCER CTR AT [place]  Discharge Instructions: Thank you for choosing [facility] to provide your oncology and hematology care.  If you have a lab appointment with the [facility], please go directly to the [facility] and check in at the registration area.  Wear comfortable clothing and clothing appropriate for easy access to any Portacath or PICC line.   We strive to give you quality time with your provider. You may need to reschedule your appointment if you arrive late (15 or more minutes).  Arriving late affects you and other patients whose appointments are after yours.  Also, if you miss three or more appointments without notifying the office, you may be dismissed from the clinic at the provider's discretion.      For prescription refill requests, have your pharmacy contact our office and allow 72 hours for refills to be completed.    Today you received the following chemotherapy and/or immunotherapy agents MONJUVI      To help prevent nausea and vomiting after your treatment, we encourage you to take your nausea medication as directed.  BELOW ARE SYMPTOMS THAT SHOULD BE REPORTED IMMEDIATELY: *FEVER GREATER THAN 100.4 F (38 C) OR HIGHER *CHILLS OR SWEATING *NAUSEA AND VOMITING THAT IS NOT CONTROLLED WITH YOUR NAUSEA MEDICATION *UNUSUAL SHORTNESS OF BREATH *UNUSUAL BRUISING OR BLEEDING *URINARY PROBLEMS (pain or burning when urinating, or frequent urination) *BOWEL PROBLEMS (unusual diarrhea, constipation, pain near the anus) TENDERNESS IN MOUTH AND THROAT WITH OR WITHOUT PRESENCE OF ULCERS (sore throat, sores in mouth, or a toothache) UNUSUAL RASH, SWELLING OR PAIN  UNUSUAL VAGINAL DISCHARGE OR ITCHING   Items with * indicate a potential emergency and should be followed up as soon as possible or go to the Emergency Department if any problems should occur.  Please show the CHEMOTHERAPY ALERT CARD or IMMUNOTHERAPY ALERT CARD at check-in to the  Emergency Department and triage nurse.  Should you have questions after your visit or need to cancel or reschedule your appointment, please contact [facility] CANCER [place] AT [place]  [phone] and follow the prompts.  Office hours are 8:00 a.m. to 4:30 p.m. Monday - Friday. Please note that voicemails left after 4:00 p.m. may not be returned until the following business day.  We are closed weekends and major holidays. You have access to a nurse at all times for urgent questions. Please call the main number to the clinic [phone] and follow the prompts.  For any non-urgent questions, you may also contact your provider using MyChart. We now offer e-Visits for anyone 3 and older to request care online for non-urgent symptoms. For details visit [email or web address].   Also download the MyChart app! Go to the app store, search "MyChart", open the app, select [facility], and log in with your MyChart username and password.  Due to Covid, a mask is required upon entering the hospital/clinic. If you do not have a mask, one will be given to you upon arrival. For doctor visits, patients may have 1 support person aged 24 or older with them. For treatment visits, patients cannot have anyone with them due to current Covid guidelines and our immunocompromised population.   Tafasitamab Injection What is this medication? TAFASITAMAB (ta fa sit a mab) is a monoclonal antibody. It is used to treat diffuse large B-cell lymphoma. This medicine may be used for other purposes; ask your health care provider or pharmacist if you have questions. COMMON BRAND NAME(S): MONJUVI What should I tell my care team  before I take this medication? They need to know if you have any of these conditions: infection (especially a viral infection such as chickenpox, cold sores, or herpes) an unusual or allergic reaction to tafasitamab, other medicines, foods, dyes, or preservatives pregnant or trying to get  pregnant breast-feeding How should I use this medication? This medicine is for infusion into a vein. It is usually given by a health care professional in a hospital or clinic setting. Talk to your pediatrician about the use of this medicine in children. Special care may be needed. Overdosage: If you think you have taken too much of this medicine contact a poison control center or emergency room at once. NOTE: This medicine is only for you. Do not share this medicine with others. What if I miss a dose? It is important not to miss your dose. Call your doctor or health care professional if you are unable to keep an appointment. What may interact with this medication? Interactions have not been studied. Give your health care provider a list of all the medicines, herbs, non-prescription drugs, or dietary supplements you use. Also tell them if you smoke, drink alcohol, or use illegal drugs. Some items may interact with your medicine. This list may not describe all possible interactions. Give your health care provider a list of all the medicines, herbs, non-prescription drugs, or dietary supplements you use. Also tell them if you smoke, drink alcohol, or use illegal drugs. Some items may interact with your medicine. What should I watch for while using this medication? Your condition will be monitored carefully while you are receiving this medicine. You may need blood work done while you are taking this medicine. This medicine may increase your risk of getting an infection. Call your health care professional for advice if you get a fever, chills, or sore throat, or other symptoms of a cold or flu. Do not treat yourself. Try to avoid being around people who are sick. Do not become pregnant while taking this medicine or for at least 3 months after stopping it. Women should inform their doctor if they wish to become pregnant or think they might be pregnant. There is a potential for serious side effects to an  unborn child. Talk to your health care professional or pharmacist for more information. Do not breast-feed an infant while taking this medicine or for at least 3 months after stopping it. What side effects may I notice from receiving this medication? Side effects that you should report to your doctor or health care professional as soon as possible: allergic reactions like skin rash, itching or hives, swelling of the face, lips, or tongue low blood counts - this medicine may decrease the number of white blood cells, red blood cells and platelets; you may be at increased risk for infections and bleeding facial flushing shortness of breath signs and symptoms of infection like fever; chills; cough; sore throat; pain or trouble passing urine Side effects that usually do not require medical attention (report these to your doctor or health care professional if they continue or are bothersome): back pain constipation decreased appetite diarrhea swelling of the ankles, feet, hands tiredness This list may not describe all possible side effects. Call your doctor for medical advice about side effects. You may report side effects to FDA at 1-800-FDA-1088. Where should I keep my medication? This drug is given in a hospital or clinic and will not be stored at home. NOTE: This sheet is a summary. It may not cover all  possible information. If you have questions about this medicine, talk to your doctor, pharmacist, or health care provider.  2023 Elsevier/Gold Standard (2018-10-19 00:00:00)

## 2021-08-21 ENCOUNTER — Encounter: Payer: Self-pay | Admitting: Pharmacist

## 2021-08-21 MED FILL — Dexamethasone Sodium Phosphate Inj 100 MG/10ML: INTRAMUSCULAR | Qty: 2 | Status: AC

## 2021-08-21 NOTE — Telephone Encounter (Signed)
Patient received Revlimid on 08/19/21 at 5pm per [facility] pharmacy.

## 2021-08-21 NOTE — Progress Notes (Signed)
Christopher Mills reported during his infusion appt on 08/20/21 that his Revlimid has delivered. He was instructed to begin his Revlimid that night and follow along with the medication calendar provider earlier that week.

## 2021-08-24 ENCOUNTER — Inpatient Hospital Stay: Payer: Self-pay

## 2021-08-24 VITALS — BP 112/65 | HR 78 | Temp 96.2°F | Resp 20 | Wt 186.0 lb

## 2021-08-24 DIAGNOSIS — C8510 Unspecified B-cell lymphoma, unspecified site: Secondary | ICD-10-CM

## 2021-08-24 LAB — CBC WITH DIFFERENTIAL/PLATELET
Abs Immature Granulocytes: 0.04 10*3/uL (ref 0.00–0.07)
Basophils Absolute: 0 10*3/uL (ref 0.0–0.1)
Basophils Relative: 0 %
Eosinophils Absolute: 0.1 10*3/uL (ref 0.0–0.5)
Eosinophils Relative: 1 %
HCT: 40.9 % (ref 39.0–52.0)
Hemoglobin: 14.2 g/dL (ref 13.0–17.0)
Immature Granulocytes: 1 %
Lymphocytes Relative: 38 %
Lymphs Abs: 1.9 10*3/uL (ref 0.7–4.0)
MCH: 31.8 pg (ref 26.0–34.0)
MCHC: 34.7 g/dL (ref 30.0–36.0)
MCV: 91.5 fL (ref 80.0–100.0)
Monocytes Absolute: 0.4 10*3/uL (ref 0.1–1.0)
Monocytes Relative: 8 %
Neutro Abs: 2.6 10*3/uL (ref 1.7–7.7)
Neutrophils Relative %: 52 %
Platelets: 188 10*3/uL (ref 150–400)
RBC: 4.47 MIL/uL (ref 4.22–5.81)
RDW: 14.4 % (ref 11.5–15.5)
WBC: 5.1 10*3/uL (ref 4.0–10.5)
nRBC: 0 % (ref 0.0–0.2)

## 2021-08-24 LAB — COMPREHENSIVE METABOLIC PANEL
ALT: 42 U/L (ref 0–44)
AST: 31 U/L (ref 15–41)
Albumin: 4.1 g/dL (ref 3.5–5.0)
Alkaline Phosphatase: 67 U/L (ref 38–126)
Anion gap: 9 (ref 5–15)
BUN: 13 mg/dL (ref 8–23)
CO2: 25 mmol/L (ref 22–32)
Calcium: 9.1 mg/dL (ref 8.9–10.3)
Chloride: 102 mmol/L (ref 98–111)
Creatinine, Ser: 0.89 mg/dL (ref 0.61–1.24)
GFR, Estimated: >60 mL/min (ref >60)
Glucose, Bld: 164 mg/dL — ABNORMAL HIGH (ref 70–99)
Potassium: 3.6 mmol/L (ref 3.5–5.1)
Sodium: 136 mmol/L (ref 135–145)
Total Bilirubin: 0.6 mg/dL (ref 0.3–1.2)
Total Protein: 6.8 g/dL (ref 6.5–8.1)

## 2021-08-24 MED ORDER — SODIUM CHLORIDE 0.9 % IV SOLN
20.0000 mg | Freq: Once | INTRAVENOUS | Status: AC
  Administered 2021-08-24: 20 mg via INTRAVENOUS
  Filled 2021-08-24: qty 2

## 2021-08-24 MED ORDER — SODIUM CHLORIDE 0.9 % IV SOLN
Freq: Once | INTRAVENOUS | Status: AC
  Filled 2021-08-24: qty 250

## 2021-08-24 MED ORDER — FAMOTIDINE IN NACL 20-0.9 MG/50ML-% IV SOLN
20.0000 mg | Freq: Once | INTRAVENOUS | Status: AC
  Administered 2021-08-24: 20 mg via INTRAVENOUS
  Filled 2021-08-24: qty 50

## 2021-08-24 MED ORDER — TAFASITAMAB-CXIX CHEMO INJECTION 200 MG
12.0000 mg/kg | Freq: Once | INTRAVENOUS | Status: AC
  Administered 2021-08-24: 1000 mg via INTRAVENOUS
  Filled 2021-08-24: qty 25

## 2021-08-24 MED ORDER — DIPHENHYDRAMINE HCL 50 MG/ML IJ SOLN
50.0000 mg | Freq: Once | INTRAMUSCULAR | Status: AC
  Administered 2021-08-24: 50 mg via INTRAVENOUS
  Filled 2021-08-24: qty 1

## 2021-08-24 MED ORDER — HEPARIN SOD (PORK) LOCK FLUSH 100 UNIT/ML IV SOLN
500.0000 [IU] | Freq: Once | INTRAVENOUS | Status: AC | PRN
  Administered 2021-08-24: 500 [IU]
  Filled 2021-08-24: qty 5

## 2021-08-24 MED ORDER — ACETAMINOPHEN 325 MG PO TABS
650.0000 mg | ORAL_TABLET | Freq: Once | ORAL | Status: AC
  Administered 2021-08-24: 650 mg via ORAL
  Filled 2021-08-24: qty 2

## 2021-08-24 NOTE — Patient Instructions (Signed)
[facility] CANCER CTR AT [place]  Discharge Instructions: Thank you for choosing [facility] to provide your oncology and hematology care.  If you have a lab appointment with the [facility], please go directly to the [facility] and check in at the registration area.  Wear comfortable clothing and clothing appropriate for easy access to any Portacath or PICC line.   We strive to give you quality time with your provider. You may need to reschedule your appointment if you arrive late (15 or more minutes).  Arriving late affects you and other patients whose appointments are after yours.  Also, if you miss three or more appointments without notifying the office, you may be dismissed from the clinic at the provider's discretion.      For prescription refill requests, have your pharmacy contact our office and allow 72 hours for refills to be completed.    Today you received the following chemotherapy and/or immunotherapy agents: MONJUVI      To help prevent nausea and vomiting after your treatment, we encourage you to take your nausea medication as directed.  BELOW ARE SYMPTOMS THAT SHOULD BE REPORTED IMMEDIATELY: *FEVER GREATER THAN 100.4 F (38 C) OR HIGHER *CHILLS OR SWEATING *NAUSEA AND VOMITING THAT IS NOT CONTROLLED WITH YOUR NAUSEA MEDICATION *UNUSUAL SHORTNESS OF BREATH *UNUSUAL BRUISING OR BLEEDING *URINARY PROBLEMS (pain or burning when urinating, or frequent urination) *BOWEL PROBLEMS (unusual diarrhea, constipation, pain near the anus) TENDERNESS IN MOUTH AND THROAT WITH OR WITHOUT PRESENCE OF ULCERS (sore throat, sores in mouth, or a toothache) UNUSUAL RASH, SWELLING OR PAIN  UNUSUAL VAGINAL DISCHARGE OR ITCHING   Items with * indicate a potential emergency and should be followed up as soon as possible or go to the Emergency Department if any problems should occur.  Please show the CHEMOTHERAPY ALERT CARD or IMMUNOTHERAPY ALERT CARD at check-in to  the Emergency Department and triage nurse.  Should you have questions after your visit or need to cancel or reschedule your appointment, please contact [facility] CANCER [place] AT [place]  [phone] and follow the prompts.  Office hours are 8:00 a.m. to 4:30 p.m. Monday - Friday. Please note that voicemails left after 4:00 p.m. may not be returned until the following business day.  We are closed weekends and major holidays. You have access to a nurse at all times for urgent questions. Please call the main number to the clinic [phone] and follow the prompts.  For any non-urgent questions, you may also contact your provider using MyChart. We now offer e-Visits for anyone 56 and older to request care online for non-urgent symptoms. For details visit [email or web address].   Also download the MyChart app! Go to the app store, search "MyChart", open the app, select [facility], and log in with your MyChart username and password.  Due to Covid, a mask is required upon entering the hospital/clinic. If you do not have a mask, one will be given to you upon arrival. For doctor visits, patients may have 1 support person aged 68 or older with them. For treatment visits, patients cannot have anyone with them due to current Covid guidelines and our immunocompromised population.

## 2021-08-28 ENCOUNTER — Inpatient Hospital Stay (HOSPITAL_BASED_OUTPATIENT_CLINIC_OR_DEPARTMENT_OTHER): Payer: Self-pay | Admitting: Nurse Practitioner

## 2021-08-28 ENCOUNTER — Inpatient Hospital Stay: Payer: Medicaid Other | Attending: Nurse Practitioner

## 2021-08-28 ENCOUNTER — Other Ambulatory Visit (HOSPITAL_COMMUNITY): Payer: Self-pay

## 2021-08-28 ENCOUNTER — Inpatient Hospital Stay: Payer: Self-pay

## 2021-08-28 ENCOUNTER — Inpatient Hospital Stay: Payer: Self-pay | Admitting: Pharmacist

## 2021-08-28 ENCOUNTER — Encounter: Payer: Self-pay | Admitting: Nurse Practitioner

## 2021-08-28 VITALS — BP 135/80 | HR 74 | Temp 98.0°F | Resp 16 | Wt 182.4 lb

## 2021-08-28 DIAGNOSIS — T451X5A Adverse effect of antineoplastic and immunosuppressive drugs, initial encounter: Secondary | ICD-10-CM

## 2021-08-28 DIAGNOSIS — C8510 Unspecified B-cell lymphoma, unspecified site: Secondary | ICD-10-CM

## 2021-08-28 DIAGNOSIS — R110 Nausea: Secondary | ICD-10-CM

## 2021-08-28 DIAGNOSIS — E860 Dehydration: Secondary | ICD-10-CM

## 2021-08-28 LAB — COMPREHENSIVE METABOLIC PANEL
ALT: 45 U/L — ABNORMAL HIGH (ref 0–44)
AST: 30 U/L (ref 15–41)
Albumin: 4.1 g/dL (ref 3.5–5.0)
Alkaline Phosphatase: 74 U/L (ref 38–126)
Anion gap: 8 (ref 5–15)
BUN: 13 mg/dL (ref 8–23)
CO2: 25 mmol/L (ref 22–32)
Calcium: 8.8 mg/dL — ABNORMAL LOW (ref 8.9–10.3)
Chloride: 103 mmol/L (ref 98–111)
Creatinine, Ser: 0.93 mg/dL (ref 0.61–1.24)
GFR, Estimated: >60 mL/min (ref >60)
Glucose, Bld: 126 mg/dL — ABNORMAL HIGH (ref 70–99)
Potassium: 3.4 mmol/L — ABNORMAL LOW (ref 3.5–5.1)
Sodium: 136 mmol/L (ref 135–145)
Total Bilirubin: 0.5 mg/dL (ref 0.3–1.2)
Total Protein: 6.8 g/dL (ref 6.5–8.1)

## 2021-08-28 LAB — CBC WITH DIFFERENTIAL/PLATELET
Abs Immature Granulocytes: 0.04 10*3/uL (ref 0.00–0.07)
Basophils Absolute: 0 10*3/uL (ref 0.0–0.1)
Basophils Relative: 1 %
Eosinophils Absolute: 0.1 10*3/uL (ref 0.0–0.5)
Eosinophils Relative: 3 %
HCT: 39.8 % (ref 39.0–52.0)
Hemoglobin: 14.1 g/dL (ref 13.0–17.0)
Immature Granulocytes: 1 %
Lymphocytes Relative: 38 %
Lymphs Abs: 1.9 10*3/uL (ref 0.7–4.0)
MCH: 32.5 pg (ref 26.0–34.0)
MCHC: 35.4 g/dL (ref 30.0–36.0)
MCV: 91.7 fL (ref 80.0–100.0)
Monocytes Absolute: 0.4 10*3/uL (ref 0.1–1.0)
Monocytes Relative: 8 %
Neutro Abs: 2.4 10*3/uL (ref 1.7–7.7)
Neutrophils Relative %: 49 %
Platelets: 193 10*3/uL (ref 150–400)
RBC: 4.34 MIL/uL (ref 4.22–5.81)
RDW: 14 % (ref 11.5–15.5)
WBC: 4.9 10*3/uL (ref 4.0–10.5)
nRBC: 0 % (ref 0.0–0.2)

## 2021-08-28 MED ORDER — ONDANSETRON HCL 8 MG PO TABS: 8.0000 mg | ORAL_TABLET | Freq: Two times a day (BID) | ORAL | 1 refills | Status: DC | PRN

## 2021-08-28 MED ORDER — HEPARIN SOD (PORK) LOCK FLUSH 100 UNIT/ML IV SOLN
500.0000 [IU] | Freq: Once | INTRAVENOUS | Status: AC
  Administered 2021-08-28: 500 [IU] via INTRAVENOUS
  Filled 2021-08-28: qty 5

## 2021-08-28 MED ORDER — SODIUM CHLORIDE 0.9 % IV SOLN
Freq: Once | INTRAVENOUS | Status: AC
  Filled 2021-08-28: qty 250

## 2021-08-28 MED ORDER — SODIUM CHLORIDE 0.9% FLUSH
10.0000 mL | Freq: Once | INTRAVENOUS | Status: DC
  Filled 2021-08-28: qty 10

## 2021-08-28 NOTE — Progress Notes (Signed)
Hematology/Oncology Progress note [phone] [phone]     Patient Care Team: [name], MD as PCP - General (Internal Medicine) Sindy Guadeloupe, MD as Consulting Physician (Hematology and Oncology) Jules Husbands, MD as Consulting Physician (General Surgery)   Name of the patient: Christopher Mills  [id number]  [birth year]   REASON FOR VISIT Chemotherapy evaluation  INTERVAL HISTORY 61 y.o. 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  He feels great, continues to work, and stays active. Eating and drinking well. Walks daily for exercise. Feels that lymph node is smaller. Questions his pathology and rationale for treatment. He has not picked up zofran prescription due to cost (>$100).   Review of Systems  Constitutional:  Negative for appetite change, chills, fatigue, fever and unexpected weight change.  HENT:   Negative for hearing loss and voice change.   Eyes:  Negative for eye problems and icterus.  Respiratory:  Negative for chest tightness, cough and shortness of breath.   Cardiovascular:  Negative for chest pain and leg swelling.  Gastrointestinal:  Negative for abdominal distention and abdominal pain.  Endocrine: Negative for hot flashes.  Genitourinary:  Negative for difficulty urinating, dysuria and frequency.   Musculoskeletal:  Negative for arthralgias.  Skin:  Negative for itching and rash.  Neurological:  Negative for light-headedness and numbness.  Hematological:  Negative for adenopathy. Does not bruise/bleed easily.  Psychiatric/Behavioral:  Negative for confusion.     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   Past Surgical History:  Procedure Laterality Date   BONE MARROW BIOPSY     COLONOSCOPY      EXCISION MASS NECK Right 06/12/2020   Procedure: EXCISION MASS NECK;  Surgeon: Jules Husbands, MD;  Location: [facility] ORS;  Service: General;  Laterality: Right;   HERNIA REPAIR Right    at age 76-RIH   [address] [place] Right 06/24/2020   Procedure: INSERTION PORT-A-CATH;  Surgeon: Jules Husbands, MD;  Location: [facility] ORS;  Service: General;  Laterality: Right;   Social History   Socioeconomic History   Marital status: Single    Spouse name: Not on file   Number of children: Not on file   Years of education: Not on file   Highest education level: Not on file  Occupational History   Not on file  Tobacco Use   Smoking status: Former    Types: Cigarettes    Quit date: 06/13/2020    Years since quitting: 1.2   Smokeless tobacco: Never  Vaping Use   Vaping Use: Never used  Substance and Sexual Activity   Alcohol use: Not Currently   Drug use: Not Currently    Types: Marijuana   Sexual activity: Not Currently  Other Topics Concern   Not on file  Social History Narrative   Has daughter and grandson in the home   Social Determinants of Health   Financial Resource Strain: Not on file  Food Insecurity: Not on file  Transportation Needs: Not on file  Physical Activity: Not on file  Stress: Not on file  Social Connections: Not on file  Intimate Partner Violence: Not on file    Family History  Problem Relation Age of Onset   Anemia  Mother    Hypertension Mother    Goiter Mother    Cancer Father    Cancer Sister    Multiple sclerosis Sister      Current Outpatient Medications:    aspirin EC 81 MG tablet, Take 1 tablet (81 mg total) by mouth daily. Swallow whole., Disp: 30 tablet, Rfl: 0   atenolol (TENORMIN) 25 MG tablet, Take 25 mg by mouth daily., Disp: , Rfl:    cholecalciferol (VITAMIN D3) 25 MCG (1000 UNIT) tablet, Take 1,000 Units by mouth daily., Disp: , Rfl:    lenalidomide (REVLIMID) 15 MG capsule, Take 1 capsule (15 mg total) by mouth  daily. Take for 21 days, then hold for 7 days. Repeat every 28 days., Disp: 21 capsule, Rfl: 0   lidocaine-prilocaine (EMLA) cream, Apply to affected area once, Disp: 30 g, Rfl: 3   lisinopril (ZESTRIL) 10 MG tablet, Take 10 mg by mouth daily., Disp: , Rfl:    Omega-3 Fatty Acids (FISH OIL ADULT GUMMIES PO), Take by mouth., Disp: , Rfl:    vitamin C (ASCORBIC ACID) 500 MG tablet, Take 500 mg by mouth daily., Disp: , Rfl:    allopurinol (ZYLOPRIM) 300 MG tablet, Take 1 tablet (300 mg total) by mouth daily. (Patient not taking: Reported on 08/28/2021), Disp: 30 tablet, Rfl: 1   ondansetron (ZOFRAN) 8 MG tablet, Take 1 tablet (8 mg total) by mouth 2 (two) times daily as needed for nausea (Nausea or vomiting)., Disp: 30 tablet, Rfl: 1   prochlorperazine (COMPAZINE) 10 MG tablet, Take 1 tablet (10 mg total) by mouth every 6 (six) hours as needed (Nausea or vomiting). (Patient not taking: Reported on 08/28/2021), Disp: 30 tablet, Rfl: 1   vitamin B-12 (CYANOCOBALAMIN) 100 MCG tablet, Take 100 mcg by mouth daily. (Patient not taking: Reported on 08/28/2021), Disp: , Rfl:  No current facility-administered medications for this visit.  Facility-Administered Medications Ordered in Other Visits:    0.9 %  sodium chloride infusion, , Intravenous, Continuous, Sindy Guadeloupe, MD, Stopped at 09/09/20 1507   0.9 %  sodium chloride infusion, , Intravenous, Continuous, Cammie Sickle, MD, Stopped at 09/11/20 1502   0.9 %  sodium chloride infusion, , Intravenous, Continuous, Sindy Guadeloupe, MD, Stopped at 11/06/20 1424   heparin lock flush 100 unit/mL, 500 Units, Intravenous, Once, Sindy Guadeloupe, MD   heparin lock flush 100 unit/mL, 500 Units, Intravenous, Once, Sindy Guadeloupe, MD   methotrexate (PF) 12 mg in sodium chloride (PF) 0.9 % INTRATHECAL chemo injection, , Intrathecal, Once, Sindy Guadeloupe, MD  Physical exam:  Vitals:   08/28/21 0843  BP: 135/80  Pulse: 74  Resp: 16  Temp: 98 F (36.7 C)   TempSrc: Tympanic  SpO2: 100%  Weight: 182 lb 6.4 oz (82.7 kg)   Physical Exam Constitutional:      General: He is not in acute distress. HENT:     Head: Normocephalic and atraumatic.  Eyes:     General: No scleral icterus. Neck:     Comments: Per patient, smaller in size Cardiovascular:     Rate and Rhythm: Normal rate and regular rhythm.  Pulmonary:     Effort: Pulmonary effort is normal. No respiratory distress.     Breath sounds: No wheezing.  Abdominal:     General: There is no distension.     Palpations: Abdomen is soft.     Tenderness: There is no abdominal tenderness.  Musculoskeletal:        General:  No deformity.     Right lower leg: No edema.     Left lower leg: No edema.  Lymphadenopathy:     Cervical: Cervical adenopathy present.  Skin:    General: Skin is warm and dry.     Findings: No erythema or rash.  Neurological:     Mental Status: He is alert and oriented to person, place, and time. Mental status is at baseline.     Cranial Nerves: No cranial nerve deficit.     Coordination: Coordination normal.  Psychiatric:        Mood and Affect: Mood normal.        Behavior: Behavior normal.       Latest Ref Rng & Units 08/28/2021    8:45 AM  CMP  Glucose 70 - 99 mg/dL 126   BUN 8 - 23 mg/dL 13   Creatinine 0.61 - 1.24 mg/dL 0.93   Sodium 135 - 145 mmol/L 136   Potassium 3.5 - 5.1 mmol/L 3.4   Chloride 98 - 111 mmol/L 103   CO2 22 - 32 mmol/L 25   Calcium 8.9 - 10.3 mg/dL 8.8   Total Protein 6.5 - 8.1 g/dL 6.8   Total Bilirubin 0.3 - 1.2 mg/dL 0.5   Alkaline Phos 38 - 126 U/L 74   AST 15 - 41 U/L 30   ALT 0 - 44 U/L 45       Latest Ref Rng & Units 08/28/2021    8:45 AM  CBC  WBC 4.0 - 10.5 K/uL 4.9   Hemoglobin 13.0 - 17.0 g/dL 14.1   Hematocrit 39.0 - 52.0 % 39.8   Platelets 150 - 400 K/uL 193     RADIOGRAPHIC STUDIES: I have personally reviewed the radiological images as listed and agreed with the findings in the report. No results  found.   Assessment and plan  No diagnosis found.  #Relapsed/Refractory diffuse large B-cell lymphoma-Cervical lymphadenopathy- currently receiving tafasitamab plus revlimid 15 mg daily D1-21. Tolerating treatments very well with minimal side effects. IV fluids today.   # nausea- zofran was cost ineffective at [facility]. We reviewed goodrx pricing options and I transferred prescription to [facility].   Follow up with Dr. Janese Banks as scheduled for consideration of continuation of treatment.   Beckey Rutter, DNP, AGNP-C [place] at [facility] [phone] (clinic) 08/28/2021

## 2021-08-28 NOTE — Progress Notes (Signed)
[place]  [phone] [phone]  Patient Care Team: [name], MD as PCP - General (Internal Medicine) Sindy Guadeloupe, MD as Consulting Physician (Hematology and Oncology) Jules Husbands, MD as Consulting Physician (General Surgery)   Name of the patient: Christopher Mills  [id number]  01-28-1961   Date of visit: 08/28/21  HPI: Patient is a 61 y.o. 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 Treatment with Revlimid (lenalidomide) and tafasitamab, started tafasitamab on 08/17/21 and lenalidomide 08/20/21.  Reason for Consult: Oral chemotherapy follow-up for lenalidomide therapy.   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    HEMATOLOGY/ONCOLOGY HISTORY:  Oncology History  High grade B-cell lymphoma ([place])  06/18/2020 Initial Diagnosis   High grade B-cell lymphoma ([place])   06/18/2020 Cancer Staging   Staging form: Hodgkin and Non-Hodgkin Lymphoma, AJCC 8th Edition - Clinical stage from 06/18/2020: Stage IV (Diffuse large B-cell lymphoma) - Signed by Sindy Guadeloupe, MD on 06/18/2020 Stage prefix: Initial diagnosis   06/21/2020 - 06/21/2020 Chemotherapy         07/03/2020 - 07/08/2020 Chemotherapy         08/12/2020 - 12/05/2020 Chemotherapy   Patient is on Treatment Plan :  NON-HODGKINS LYMPHOMA R-EPOCH q21d     08/17/2021 -  Chemotherapy   Patient is on Treatment Plan : NON-HODGKIN'S LYMPHOMA DLBCL RELAPSED/REFRACTORY Tafasitamab-cxix + Lenalidomide q28d / Tafasitamab-cxix Maintenance q28d       ALLERGIES:  has No Known Allergies.  MEDICATIONS:  Current Outpatient Medications  Medication Sig Dispense Refill   allopurinol (ZYLOPRIM) 300 MG tablet Take 1 tablet (300 mg total) by mouth daily. (Patient not taking: Reported on 08/28/2021) 30 tablet 1   aspirin EC 81 MG tablet Take 1 tablet (81 mg  total) by mouth daily. Swallow whole. 30 tablet 0   atenolol (TENORMIN) 25 MG tablet Take 25 mg by mouth daily.     cholecalciferol (VITAMIN D3) 25 MCG (1000 UNIT) tablet Take 1,000 Units by mouth daily.     lenalidomide (REVLIMID) 15 MG capsule Take 1 capsule (15 mg total) by mouth daily. Take for 21 days, then hold for 7 days. Repeat every 28 days. 21 capsule 0   lidocaine-prilocaine (EMLA) cream Apply to affected area once 30 g 3   lisinopril (ZESTRIL) 10 MG tablet Take 10 mg by mouth daily.     Omega-3 Fatty Acids (FISH OIL ADULT GUMMIES PO) Take by mouth.     ondansetron (ZOFRAN) 8 MG tablet Take 1 tablet (8 mg total) by mouth 2 (two) times daily as needed for nausea (Nausea or vomiting). 30 tablet 1   prochlorperazine (COMPAZINE) 10 MG tablet Take 1 tablet (10 mg total) by mouth every 6 (six) hours as needed (Nausea or vomiting). (Patient not taking: Reported on 08/28/2021) 30 tablet 1   vitamin B-12 (CYANOCOBALAMIN) 100 MCG tablet Take 100 mcg by mouth daily. (Patient not taking: Reported on 08/28/2021)     vitamin C (ASCORBIC ACID) 500 MG tablet Take 500 mg by mouth daily.     No current facility-administered medications for this visit.   Facility-Administered Medications Ordered in Other Visits  Medication Dose Route Frequency Provider Last Rate Last Admin   0.9 %  sodium chloride infusion   Intravenous Continuous Sindy Guadeloupe, MD   Stopped at 09/09/20 [phone]  0.9 %  sodium chloride infusion   Intravenous Continuous Cammie Sickle, MD   Stopped at 09/11/20 1502   0.9 %  sodium chloride infusion   Intravenous Continuous Sindy Guadeloupe, MD   Stopped at 11/06/20 1424   heparin lock flush 100 unit/mL  500 Units Intravenous Once Sindy Guadeloupe, MD       heparin lock flush 100 unit/mL  500 Units Intravenous Once Sindy Guadeloupe, MD       methotrexate (PF) 12 mg in sodium chloride (PF) 0.9 % INTRATHECAL chemo injection   Intrathecal Once Sindy Guadeloupe, MD        VITAL SIGNS: There  were no vitals taken for this visit. There were no vitals filed for this visit.  Estimated body mass index is 24.06 kg/m as calculated from the following:   Height as of 08/20/21: '6\' 1"'$  (1.854 m).   Weight as of an earlier encounter on 08/28/21: 82.7 kg (182 lb 6.4 oz).  LABS: CBC:    Component Value Date/Time   WBC 4.9 08/28/2021 0845   HGB 14.1 08/28/2021 0845   HGB 13.1 07/03/2020 0935   HCT 39.8 08/28/2021 0845   HCT 40.1 02/19/2013 1716   PLT 193 08/28/2021 0845   PLT 274 02/19/2013 1716   MCV 91.7 08/28/2021 0845   MCV 86 02/19/2013 1716   NEUTROABS 2.4 08/28/2021 0845   LYMPHSABS 1.9 08/28/2021 0845   MONOABS 0.4 08/28/2021 0845   EOSABS 0.1 08/28/2021 0845   BASOSABS 0.0 08/28/2021 0845   Comprehensive Metabolic Panel:    Component Value Date/Time   NA 136 08/28/2021 0845   NA 135 (L) 02/19/2013 1716   K 3.4 (L) 08/28/2021 0845   K 3.5 02/19/2013 1716   CL 103 08/28/2021 0845   CL 102 02/19/2013 1716   CO2 25 08/28/2021 0845   CO2 27 02/19/2013 1716   BUN 13 08/28/2021 0845   BUN 16 02/19/2013 1716   CREATININE 0.93 08/28/2021 0845   CREATININE 1.12 02/19/2013 1716   GLUCOSE 126 (H) 08/28/2021 0845   GLUCOSE 94 02/19/2013 1716   CALCIUM 8.8 (L) 08/28/2021 0845   CALCIUM 9.6 02/19/2013 1716   AST 30 08/28/2021 0845   AST 33 02/19/2013 1716   ALT 45 (H) 08/28/2021 0845   ALT 41 02/19/2013 1716   ALKPHOS 74 08/28/2021 0845   ALKPHOS 79 02/19/2013 1716   BILITOT 0.5 08/28/2021 0845   BILITOT 0.4 02/19/2013 1716   PROT 6.8 08/28/2021 0845   PROT 7.9 02/19/2013 1716   ALBUMIN 4.1 08/28/2021 0845   ALBUMIN 4.2 02/19/2013 1716     Present during today's visit: patient only  Assessment and Plan: Continue on lenalidomide '15mg'$  21on/7off Pt made aware to follow schedule as provided and hold on to extra tablets from this month to remain on schedule with tafasitamab infusions.   Oral Chemotherapy Side Effect/Intolerance:  No reported diarrhea, constipation,  N/V, edema, rash or fatigue.  Oral Chemotherapy Adherence: No reported missed doses. No patient barriers to medication adherence identified.   New medications: None reported.  Medication Access Issues: None, pt reminded to call pharmacy about Revlimid delivery during last week of cycle if he has not heard from them yet.  Patient expressed understanding and was in agreement with this plan. He also understands that He can call clinic at any time with any questions, concerns, or complaints.   Follow-up plan: RTC 08/31/21  Thank you for allowing me to participate in the care of  this very pleasant patient.   Time Total: 15 min  Visit consisted of counseling and education on dealing with issues of symptom management in the setting of serious and potentially life-threatening illness.Greater than 50%  of this time was spent counseling and coordinating care related to the above assessment and plan.  Signed by: Darl Pikes, PharmD, BCPS, Salley Slaughter, CPP Hematology/Oncology Clinical Pharmacist Practitioner /DB/AP [facility] [phone]  08/28/2021 9:39 AM

## 2021-08-31 ENCOUNTER — Inpatient Hospital Stay: Payer: Self-pay | Admitting: Pharmacist

## 2021-08-31 ENCOUNTER — Inpatient Hospital Stay: Payer: Self-pay

## 2021-08-31 ENCOUNTER — Encounter: Payer: Self-pay | Admitting: Oncology

## 2021-08-31 ENCOUNTER — Telehealth: Payer: Self-pay | Admitting: *Deleted

## 2021-08-31 ENCOUNTER — Inpatient Hospital Stay (HOSPITAL_BASED_OUTPATIENT_CLINIC_OR_DEPARTMENT_OTHER): Payer: Self-pay | Admitting: Oncology

## 2021-08-31 VITALS — BP 131/73 | HR 63 | Temp 98.4°F | Resp 20 | Wt 186.4 lb

## 2021-08-31 DIAGNOSIS — Z5111 Encounter for antineoplastic chemotherapy: Secondary | ICD-10-CM

## 2021-08-31 DIAGNOSIS — C8510 Unspecified B-cell lymphoma, unspecified site: Secondary | ICD-10-CM

## 2021-08-31 DIAGNOSIS — Z79899 Other long term (current) drug therapy: Secondary | ICD-10-CM

## 2021-08-31 LAB — CBC WITH DIFFERENTIAL/PLATELET
Abs Immature Granulocytes: 0.01 10*3/uL (ref 0.00–0.07)
Basophils Absolute: 0 10*3/uL (ref 0.0–0.1)
Basophils Relative: 1 %
Eosinophils Absolute: 0.1 10*3/uL (ref 0.0–0.5)
Eosinophils Relative: 3 %
HCT: 38.3 % — ABNORMAL LOW (ref 39.0–52.0)
Hemoglobin: 13.3 g/dL (ref 13.0–17.0)
Immature Granulocytes: 0 %
Lymphocytes Relative: 40 %
Lymphs Abs: 1.7 10*3/uL (ref 0.7–4.0)
MCH: 31.8 pg (ref 26.0–34.0)
MCHC: 34.7 g/dL (ref 30.0–36.0)
MCV: 91.6 fL (ref 80.0–100.0)
Monocytes Absolute: 0.4 10*3/uL (ref 0.1–1.0)
Monocytes Relative: 10 %
Neutro Abs: 2 10*3/uL (ref 1.7–7.7)
Neutrophils Relative %: 46 %
Platelets: 201 10*3/uL (ref 150–400)
RBC: 4.18 MIL/uL — ABNORMAL LOW (ref 4.22–5.81)
RDW: 13.5 % (ref 11.5–15.5)
WBC: 4.2 10*3/uL (ref 4.0–10.5)
nRBC: 0 % (ref 0.0–0.2)

## 2021-08-31 LAB — COMPREHENSIVE METABOLIC PANEL
ALT: 36 U/L (ref 0–44)
AST: 27 U/L (ref 15–41)
Albumin: 3.8 g/dL (ref 3.5–5.0)
Alkaline Phosphatase: 70 U/L (ref 38–126)
Anion gap: 7 (ref 5–15)
BUN: 15 mg/dL (ref 8–23)
CO2: 27 mmol/L (ref 22–32)
Calcium: 8.8 mg/dL — ABNORMAL LOW (ref 8.9–10.3)
Chloride: 106 mmol/L (ref 98–111)
Creatinine, Ser: 1.09 mg/dL (ref 0.61–1.24)
GFR, Estimated: >60 mL/min (ref >60)
Glucose, Bld: 96 mg/dL (ref 70–99)
Potassium: 3.8 mmol/L (ref 3.5–5.1)
Sodium: 140 mmol/L (ref 135–145)
Total Bilirubin: 0.5 mg/dL (ref 0.3–1.2)
Total Protein: 6.6 g/dL (ref 6.5–8.1)

## 2021-08-31 MED ORDER — HEPARIN SOD (PORK) LOCK FLUSH 100 UNIT/ML IV SOLN
INTRAVENOUS | Status: AC
  Administered 2021-08-31: 500 [IU]
  Filled 2021-08-31: qty 5

## 2021-08-31 MED ORDER — SODIUM CHLORIDE 0.9 % IV SOLN
20.0000 mg | Freq: Once | INTRAVENOUS | Status: AC
  Administered 2021-08-31: 20 mg via INTRAVENOUS
  Filled 2021-08-31: qty 2

## 2021-08-31 MED ORDER — PROCHLORPERAZINE MALEATE 10 MG PO TABS: 10.0000 mg | ORAL_TABLET | Freq: Once | ORAL | Status: DC

## 2021-08-31 MED ORDER — TAFASITAMAB-CXIX CHEMO INJECTION 200 MG
12.0000 mg/kg | Freq: Once | INTRAVENOUS | Status: AC
  Administered 2021-08-31: 1000 mg via INTRAVENOUS
  Filled 2021-08-31: qty 25

## 2021-08-31 MED ORDER — HEPARIN SOD (PORK) LOCK FLUSH 100 UNIT/ML IV SOLN
500.0000 [IU] | Freq: Once | INTRAVENOUS | Status: AC | PRN
  Filled 2021-08-31: qty 5

## 2021-08-31 MED ORDER — TAFASITAMAB-CXIX CHEMO INJECTION 200 MG: 12.0000 mg/kg | Freq: Once | INTRAVENOUS | Status: DC

## 2021-08-31 MED ORDER — ACETAMINOPHEN 325 MG PO TABS
650.0000 mg | ORAL_TABLET | Freq: Once | ORAL | Status: AC
  Administered 2021-08-31: 650 mg via ORAL
  Filled 2021-08-31: qty 2

## 2021-08-31 MED ORDER — SODIUM CHLORIDE 0.9 % IV SOLN
Freq: Once | INTRAVENOUS | Status: AC
  Filled 2021-08-31: qty 250

## 2021-08-31 MED ORDER — FAMOTIDINE IN NACL 20-0.9 MG/50ML-% IV SOLN
20.0000 mg | Freq: Once | INTRAVENOUS | Status: AC
  Administered 2021-08-31: 20 mg via INTRAVENOUS
  Filled 2021-08-31: qty 50

## 2021-08-31 MED ORDER — DIPHENHYDRAMINE HCL 50 MG/ML IJ SOLN
50.0000 mg | Freq: Once | INTRAMUSCULAR | Status: AC
  Administered 2021-08-31: 50 mg via INTRAVENOUS
  Filled 2021-08-31: qty 1

## 2021-08-31 NOTE — Patient Instructions (Addendum)
[facility]  Discharge Instructions: Thank you for choosing [facility] to provide your oncology and hematology care.  If you have a lab appointment with the Cancer Center, please go directly to the Cancer Center and check in at the registration area.  Wear comfortable clothing and clothing appropriate for easy access to any Portacath or PICC line.   We strive to give you quality time with your provider. You may need to reschedule your appointment if you arrive late (15 or more minutes).  Arriving late affects you and other patients whose appointments are after yours.  Also, if you miss three or more appointments without notifying the office, you may be dismissed from the clinic at the provider's discretion.      For prescription refill requests, have your pharmacy contact our office and allow 72 hours for refills to be completed.    Today you received the following chemotherapy and/or immunotherapy agents Monjuvi   To help prevent nausea and vomiting after your treatment, we encourage you to take your nausea medication as directed.  BELOW ARE SYMPTOMS THAT SHOULD BE REPORTED IMMEDIATELY: *FEVER GREATER THAN 100.4 F (38 C) OR HIGHER *CHILLS OR SWEATING *NAUSEA AND VOMITING THAT IS NOT CONTROLLED WITH YOUR NAUSEA MEDICATION *UNUSUAL SHORTNESS OF BREATH *UNUSUAL BRUISING OR BLEEDING *URINARY PROBLEMS (pain or burning when urinating, or frequent urination) *BOWEL PROBLEMS (unusual diarrhea, constipation, pain near the anus) TENDERNESS IN MOUTH AND THROAT WITH OR WITHOUT PRESENCE OF ULCERS (sore throat, sores in mouth, or a toothache) UNUSUAL RASH, SWELLING OR PAIN  UNUSUAL VAGINAL DISCHARGE OR ITCHING   Items with * indicate a potential emergency and should be followed up as soon as possible or go to the Emergency Department if any problems should occur.  Please show the CHEMOTHERAPY ALERT CARD or IMMUNOTHERAPY ALERT CARD at check-in to the  Emergency Department and triage nurse.  Should you have questions after your visit or need to cancel or reschedule your appointment, please contact [facility]  [phone] and follow the prompts.  Office hours are 8:00 a.m. to 4:30 p.m. Monday - Friday. Please note that voicemails left after 4:00 p.m. may not be returned until the following business day.  We are closed weekends and major holidays. You have access to a nurse at all times for urgent questions. Please call the main number to the clinic [phone] and follow the prompts.  For any non-urgent questions, you may also contact your provider using MyChart. We now offer e-Visits for anyone 18 and older to request care online for non-urgent symptoms. For details visit [email or web address].   Also download the MyChart app! Go to the app store, search "MyChart", open the app, select [facility], and log in with your MyChart username and password.  Masks are optional in the cancer centers. If you would like for your care team to wear a mask while they are taking care of you, please let them know. For doctor visits, patients may have with them one support person who is at least 62 years old. At this time, visitors are not allowed in the infusion area.   

## 2021-08-31 NOTE — Progress Notes (Signed)
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  This was followed by a PET CT scan which showed extensive FDG avid adenopathy in the neck chest abdomen and pelvis.  FDG avid splenic lesions.  Solitary intramuscular FDG avid lesion in the right biceps femoris muscle.   Supraclavicular excisional lymph node biopsy showed high-grade B-cell lymphoma germinal center type Ki-67 greater than 95%.  FISH testing was positive for Bcl-2 BCL6 and MYC consistent with triple hit lymphoma.  By NCCN IPI score would be 4 based on age and elevated LDH and stage IV (intramuscular biceps femoris lesion) which puts him in the high intermediate risk group. 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   Patient received RCHOP for cycle 1 with plans for DA [facility] with cycle 2. IT MTX for CNS prophylaxis and he has received 3 cycles but declined further cycles.  MRI brain negative for lymphoma.  Chemotherapy was not being able to escalated upwards due to neutropenia which has persisted to 3 weeks and he has been receiving treatment  every 4 weeks   PET CT scan after 3 cycles of chemotherapy showedComplete or near complete metabolic response.  Residual disease in the anterior upper right mediastinum slightly greater than background mediastinal activity   Patient found to have a clinically palpable right supraclavicular lymph node which was subsequently biopsied and was consistent with previously diagnosed lymphoma.  PET CT scan showedNew right level 5 adenopathy in the lower neck with dominant lymph node 1.6 cm [place] 5.  No other findings of acute active lymphoma in chest abdomen pelvis and bones.  MRI brain negative for leptomeningeal disease.   Patient seen for second opinion by Dr. Lonia Blood at [facility] for consideration of CAR-T cell therapy.  Patient did not wish to proceed with CAR-T cell treatment after reviewing risks versus benefits.  Moreover patient's health insurance will also not cover CAR-T cell therapy.    Interval history-tolerating treatments well so far.  Denies any nausea vomiting or diarrhea.  He started his Revlimid a little later than usual as he had some delay in getting the drug.  Feels that his right neck node is a little smaller  ECOG PS- 0 Pain scale- 0   Review of systems- Review of Systems  Constitutional:  Negative for chills, fever, malaise/fatigue and weight loss.  HENT:  Negative for congestion, ear discharge and nosebleeds.   Eyes:  Negative for blurred vision.  Respiratory:  Negative for cough, hemoptysis, sputum production, shortness of breath and wheezing.   Cardiovascular:  Negative for chest pain, palpitations, orthopnea and claudication.  Gastrointestinal:  Negative for abdominal pain, blood in stool, constipation, diarrhea, heartburn, melena, nausea and vomiting.  Genitourinary:  Negative for dysuria, flank pain, frequency, hematuria and urgency.  Musculoskeletal:  Negative for back pain, joint pain and myalgias.  Skin:  Negative for rash.  Neurological:  Negative for dizziness,  tingling, focal weakness, seizures, weakness and headaches.  Endo/Heme/Allergies:  Does not bruise/bleed easily.  Psychiatric/Behavioral:  Negative for depression and suicidal ideas. The patient does not have insomnia.       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     Past Surgical History:  Procedure Laterality Date   BONE MARROW BIOPSY     COLONOSCOPY     EXCISION MASS NECK Right 06/12/2020   Procedure: EXCISION MASS NECK;  Surgeon: Jules Husbands, MD;  Location: [facility] ORS;  Service: General;  Laterality: Right;   HERNIA REPAIR Right    at age 76-RIH   [address] [place] Right 06/24/2020   Procedure: INSERTION PORT-A-CATH;  Surgeon: Jules Husbands, MD;  Location: [facility] ORS;  Service: General;  Laterality: Right;    Social History   Socioeconomic History   Marital status: Single    Spouse name: Not on file   Number of children: Not on file   Years of education: Not on file   Highest education level: Not on file  Occupational History   Not on file  Tobacco Use   Smoking status: Former    Types: Cigarettes    Quit date: 06/13/2020    Years since quitting: 1.2   Smokeless tobacco: Never  Vaping Use   Vaping Use: Never used  Substance and Sexual Activity   Alcohol use: Not Currently   Drug use: Not Currently    Types: Marijuana   Sexual activity: Not Currently  Other Topics Concern   Not on file  Social History Narrative   Has daughter and grandson in the home   Social Determinants of Health   Financial Resource Strain: Not on file  Food Insecurity: Not on file  Transportation Needs: Not on file  Physical Activity: Not on file  Stress: Not on file  Social Connections: Not on file  Intimate Partner Violence: Not on file    Family History  Problem Relation Age of Onset   Anemia Mother     Hypertension Mother    Goiter Mother    Cancer Father    Cancer Sister    Multiple sclerosis Sister      Current Outpatient Medications:    allopurinol (ZYLOPRIM) 300 MG tablet, Take 1 tablet (300 mg total) by mouth daily. (Patient not taking: Reported on 08/28/2021), Disp: 30 tablet, Rfl: 1   aspirin EC 81 MG tablet, Take 1 tablet (81 mg total) by mouth daily. Swallow whole., Disp: 30 tablet, Rfl: 0   atenolol (TENORMIN) 25 MG tablet, Take 25 mg by mouth daily., Disp: , Rfl:    cholecalciferol (VITAMIN D3) 25 MCG (1000 UNIT) tablet, Take 1,000 Units by mouth daily., Disp: , Rfl:    lenalidomide (REVLIMID) 15 MG capsule, Take 1 capsule (15 mg total) by mouth daily. Take for 21 days, then hold for 7 days. Repeat every 28 days., Disp: 21 capsule, Rfl: 0   lidocaine-prilocaine (EMLA) cream, Apply to affected area once, Disp: 30 g, Rfl: 3   lisinopril (ZESTRIL) 10 MG tablet, Take 10 mg by mouth daily., Disp: , Rfl:  Omega-3 Fatty Acids (FISH OIL ADULT GUMMIES PO), Take by mouth., Disp: , Rfl:    ondansetron (ZOFRAN) 8 MG tablet, Take 1 tablet (8 mg total) by mouth 2 (two) times daily as needed for nausea (Nausea or vomiting)., Disp: 30 tablet, Rfl: 1   prochlorperazine (COMPAZINE) 10 MG tablet, Take 1 tablet (10 mg total) by mouth every 6 (six) hours as needed (Nausea or vomiting). (Patient not taking: Reported on 08/28/2021), Disp: 30 tablet, Rfl: 1   vitamin B-12 (CYANOCOBALAMIN) 100 MCG tablet, Take 100 mcg by mouth daily. (Patient not taking: Reported on 08/28/2021), Disp: , Rfl:    vitamin C (ASCORBIC ACID) 500 MG tablet, Take 500 mg by mouth daily., Disp: , Rfl:  No current facility-administered medications for this visit.  Facility-Administered Medications Ordered in Other Visits:    0.9 %  sodium chloride infusion, , Intravenous, Continuous, Sindy Guadeloupe, MD, Stopped at 09/09/20 1507   0.9 %  sodium chloride infusion, , Intravenous, Continuous, Rogue Bussing, Elisha Headland, MD, Stopped at  09/11/20 1502   0.9 %  sodium chloride infusion, , Intravenous, Continuous, Sindy Guadeloupe, MD, Stopped at 11/06/20 1424   heparin lock flush 100 unit/mL, 500 Units, Intravenous, Once, Sindy Guadeloupe, MD   methotrexate (PF) 12 mg in sodium chloride (PF) 0.9 % INTRATHECAL chemo injection, , Intrathecal, Once, Sindy Guadeloupe, MD  Physical exam: There were no vitals filed for this visit. Physical Exam Cardiovascular:     Rate and Rhythm: Normal rate and regular rhythm.     Heart sounds: Normal heart sounds.  Pulmonary:     Effort: Pulmonary effort is normal.     Breath sounds: Normal breath sounds.  Abdominal:     General: Bowel sounds are normal.     Palpations: Abdomen is soft.  Lymphadenopathy:     Comments: Palpable right supraclavicular adenopathy which appears less pronounced today  Skin:    General: Skin is warm and dry.  Neurological:     Mental Status: He is alert and oriented to person, place, and time.         Latest Ref Rng & Units 08/31/2021    8:26 AM  CMP  Glucose 70 - 99 mg/dL 96   BUN 8 - 23 mg/dL 15   Creatinine 0.61 - 1.24 mg/dL 1.09   Sodium 135 - 145 mmol/L 140   Potassium 3.5 - 5.1 mmol/L 3.8   Chloride 98 - 111 mmol/L 106   CO2 22 - 32 mmol/L 27   Calcium 8.9 - 10.3 mg/dL 8.8   Total Protein 6.5 - 8.1 g/dL 6.6   Total Bilirubin 0.3 - 1.2 mg/dL 0.5   Alkaline Phos 38 - 126 U/L 70   AST 15 - 41 U/L 27   ALT 0 - 44 U/L 36       Latest Ref Rng & Units 08/31/2021    8:26 AM  CBC  WBC 4.0 - 10.5 K/uL 4.2   Hemoglobin 13.0 - 17.0 g/dL 13.3   Hematocrit 39.0 - 52.0 % 38.3   Platelets 150 - 400 K/uL 201      Assessment and plan- Patient is a 61 y.o. male  With history of stage IV diffuse large B-cell lymphoma triple hit s/p 6 cycles of R-EPOCH chemotherapy now with relapsed/refractory disease within 12 months of initial treatment.  He is here for on treatment assessment prior to cycle 1 day 15 of tafasitamab chemotherapy  Counts okay to proceed with  cycle 1 day 15  of tafasitamab chemotherapy today.  He will directly proceed for cycle 1 day 22 of treatment next week and I will see him back in 2 weeks for cycle 2-day 1.  For cycle 2 and cycle 3 he will be receiving day 1, day 8, day 15 and day 22 of treatment.  Patient is also on Revlimid 15 mg 3 weeks on and 1 week off which she will continue until progression or toxicity.  He will take his Revlimid this weekend and next week will be his week off.  While on Revlimid patient will continue aspirin prophylaxis.   Visit Diagnosis 1. Encounter for antineoplastic chemotherapy   2. High grade B-cell lymphoma ([place])   3. High risk medication use      Dr. Randa Evens, MD, MPH [facility] at [facility] [phone] 08/31/2021 8:35 AM

## 2021-08-31 NOTE — Telephone Encounter (Signed)
Per Estelle Grumbles the codes we gave for him did not cover the monjovi. Apolonio Schneiders said she is trying with another code but we do not have it approved from the company. If pt gets the authorization then it will be covered, if he does not get it approved the bill will be sent to him. I told him that dr Janese Banks wants to wait to give him treatment because it is not approved. We may know the answer next week but not able to  know what they the company will say. Just want to make sure the pt. Knows that he may get bill. He says that his life is more important than the bill. So go ahead with treatment

## 2021-09-02 ENCOUNTER — Telehealth: Payer: Self-pay | Admitting: *Deleted

## 2021-09-02 NOTE — Progress Notes (Addendum)
..  Patient is receiving Assistance Medication - Supplied Externally. Medication: Monjuvi Manufacture: [employer] [facility] Approval Dates: Approved from 09/02/2021 until 09/03/2022. ID: [id number] Reason: Self Pay First DOS: 09/07/2021

## 2021-09-02 NOTE — Telephone Encounter (Signed)
Called the pt and asked about the  monjuvi and it has been approved and it does not go back and pay for the treatments he has had already. We have to wait to get the monjuvi from the company and can't use ours. So I wanted to see if he wants to skip next week because the medication will not be here by then. Patient agreeable for this and I have cancelled his appt. He will come 09/14/2021 for next time and he knows. I have asked for Jodie Echevaria to see if there is a foundation to cover the monjuvi he has already had got before the monjuvi was approved through the company

## 2021-09-02 NOTE — Progress Notes (Deleted)
..  Patient is receiving Assistance Medication - Supplied Externally. Medication: Monjuvi Manufacture: [employer] Approval Dates: Approved from 09/02/2021 until 09/03/2022. ID: [id number] Reason: Self Pay First DOS: 09/07/2021

## 2021-09-07 ENCOUNTER — Other Ambulatory Visit: Payer: Self-pay | Admitting: *Deleted

## 2021-09-07 ENCOUNTER — Inpatient Hospital Stay: Payer: Self-pay

## 2021-09-07 DIAGNOSIS — C8331 Diffuse large B-cell lymphoma, lymph nodes of head, face, and neck: Secondary | ICD-10-CM

## 2021-09-14 ENCOUNTER — Inpatient Hospital Stay (HOSPITAL_BASED_OUTPATIENT_CLINIC_OR_DEPARTMENT_OTHER): Payer: Self-pay | Admitting: Oncology

## 2021-09-14 ENCOUNTER — Encounter: Payer: Self-pay | Admitting: Oncology

## 2021-09-14 ENCOUNTER — Inpatient Hospital Stay: Payer: Self-pay

## 2021-09-14 ENCOUNTER — Inpatient Hospital Stay: Payer: Self-pay | Attending: Oncology

## 2021-09-14 VITALS — BP 115/75 | HR 70 | Temp 97.5°F | Resp 16 | Wt 180.0 lb

## 2021-09-14 DIAGNOSIS — Z809 Family history of malignant neoplasm, unspecified: Secondary | ICD-10-CM | POA: Insufficient documentation

## 2021-09-14 DIAGNOSIS — Z8269 Family history of other diseases of the musculoskeletal system and connective tissue: Secondary | ICD-10-CM | POA: Insufficient documentation

## 2021-09-14 DIAGNOSIS — Z7961 Long term (current) use of immunomodulator: Secondary | ICD-10-CM | POA: Insufficient documentation

## 2021-09-14 DIAGNOSIS — Z5111 Encounter for antineoplastic chemotherapy: Secondary | ICD-10-CM

## 2021-09-14 DIAGNOSIS — Z79899 Other long term (current) drug therapy: Secondary | ICD-10-CM

## 2021-09-14 DIAGNOSIS — C8510 Unspecified B-cell lymphoma, unspecified site: Secondary | ICD-10-CM

## 2021-09-14 DIAGNOSIS — Z5112 Encounter for antineoplastic immunotherapy: Secondary | ICD-10-CM | POA: Insufficient documentation

## 2021-09-14 DIAGNOSIS — Z87891 Personal history of nicotine dependence: Secondary | ICD-10-CM | POA: Insufficient documentation

## 2021-09-14 DIAGNOSIS — D72819 Decreased white blood cell count, unspecified: Secondary | ICD-10-CM | POA: Insufficient documentation

## 2021-09-14 DIAGNOSIS — C8331 Diffuse large B-cell lymphoma, lymph nodes of head, face, and neck: Secondary | ICD-10-CM | POA: Insufficient documentation

## 2021-09-14 DIAGNOSIS — Z8249 Family history of ischemic heart disease and other diseases of the circulatory system: Secondary | ICD-10-CM | POA: Insufficient documentation

## 2021-09-14 DIAGNOSIS — Z832 Family history of diseases of the blood and blood-forming organs and certain disorders involving the immune mechanism: Secondary | ICD-10-CM | POA: Insufficient documentation

## 2021-09-14 DIAGNOSIS — Z8349 Family history of other endocrine, nutritional and metabolic diseases: Secondary | ICD-10-CM | POA: Insufficient documentation

## 2021-09-14 DIAGNOSIS — R7402 Elevation of levels of lactic acid dehydrogenase (LDH): Secondary | ICD-10-CM | POA: Insufficient documentation

## 2021-09-14 LAB — COMPREHENSIVE METABOLIC PANEL
ALT: 26 U/L (ref 0–44)
AST: 29 U/L (ref 15–41)
Albumin: 4 g/dL (ref 3.5–5.0)
Alkaline Phosphatase: 83 U/L (ref 38–126)
Anion gap: 9 (ref 5–15)
BUN: 18 mg/dL (ref 8–23)
CO2: 24 mmol/L (ref 22–32)
Calcium: 8.6 mg/dL — ABNORMAL LOW (ref 8.9–10.3)
Chloride: 104 mmol/L (ref 98–111)
Creatinine, Ser: 0.96 mg/dL (ref 0.61–1.24)
GFR, Estimated: >60 mL/min (ref >60)
Glucose, Bld: 138 mg/dL — ABNORMAL HIGH (ref 70–99)
Potassium: 3.2 mmol/L — ABNORMAL LOW (ref 3.5–5.1)
Sodium: 137 mmol/L (ref 135–145)
Total Bilirubin: 1.2 mg/dL (ref 0.3–1.2)
Total Protein: 6.5 g/dL (ref 6.5–8.1)

## 2021-09-14 LAB — CBC WITH DIFFERENTIAL/PLATELET
Abs Immature Granulocytes: 0.02 10*3/uL (ref 0.00–0.07)
Basophils Absolute: 0 10*3/uL (ref 0.0–0.1)
Basophils Relative: 1 %
Eosinophils Absolute: 0.1 10*3/uL (ref 0.0–0.5)
Eosinophils Relative: 2 %
HCT: 38.4 % — ABNORMAL LOW (ref 39.0–52.0)
Hemoglobin: 13.3 g/dL (ref 13.0–17.0)
Immature Granulocytes: 1 %
Lymphocytes Relative: 40 %
Lymphs Abs: 1.5 10*3/uL (ref 0.7–4.0)
MCH: 31.5 pg (ref 26.0–34.0)
MCHC: 34.6 g/dL (ref 30.0–36.0)
MCV: 91 fL (ref 80.0–100.0)
Monocytes Absolute: 0.5 10*3/uL (ref 0.1–1.0)
Monocytes Relative: 12 %
Neutro Abs: 1.7 10*3/uL (ref 1.7–7.7)
Neutrophils Relative %: 44 %
Platelets: 217 10*3/uL (ref 150–400)
RBC: 4.22 MIL/uL (ref 4.22–5.81)
RDW: 13.5 % (ref 11.5–15.5)
WBC: 3.8 10*3/uL — ABNORMAL LOW (ref 4.0–10.5)
nRBC: 0 % (ref 0.0–0.2)

## 2021-09-14 MED ORDER — HEPARIN SOD (PORK) LOCK FLUSH 100 UNIT/ML IV SOLN
500.0000 [IU] | Freq: Once | INTRAVENOUS | Status: AC
  Administered 2021-09-14: 500 [IU] via INTRAVENOUS
  Filled 2021-09-14: qty 5

## 2021-09-14 MED ORDER — SODIUM CHLORIDE 0.9 % IV SOLN
Freq: Once | INTRAVENOUS | Status: AC
  Filled 2021-09-14: qty 250

## 2021-09-14 MED ORDER — HEPARIN SOD (PORK) LOCK FLUSH 100 UNIT/ML IV SOLN
INTRAVENOUS | Status: AC
  Filled 2021-09-14: qty 5

## 2021-09-14 MED ORDER — TAFASITAMAB-CXIX CHEMO INJECTION 200 MG
12.0000 mg/kg | Freq: Once | INTRAVENOUS | Status: AC
  Administered 2021-09-14: 1000 mg via INTRAVENOUS
  Filled 2021-09-14: qty 25

## 2021-09-14 MED ORDER — PROCHLORPERAZINE MALEATE 10 MG PO TABS
10.0000 mg | ORAL_TABLET | Freq: Once | ORAL | Status: AC
  Administered 2021-09-14: 10 mg via ORAL
  Filled 2021-09-14: qty 1

## 2021-09-14 NOTE — Patient Instructions (Signed)
[facility] CANCER CTR AT [place]  Discharge Instructions: Thank you for choosing [facility] to provide your oncology and hematology care.  If you have a lab appointment with the [facility], please go directly to the [facility] and check in at the registration area.  Wear comfortable clothing and clothing appropriate for easy access to any Portacath or PICC line.   We strive to give you quality time with your provider. You may need to reschedule your appointment if you arrive late (15 or more minutes).  Arriving late affects you and other patients whose appointments are after yours.  Also, if you miss three or more appointments without notifying the office, you may be dismissed from the clinic at the provider's discretion.      For prescription refill requests, have your pharmacy contact our office and allow 72 hours for refills to be completed.    Today you received the following chemotherapy and/or immunotherapy agents : Tafasitamab   To help prevent nausea and vomiting after your treatment, we encourage you to take your nausea medication as directed.  BELOW ARE SYMPTOMS THAT SHOULD BE REPORTED IMMEDIATELY: *FEVER GREATER THAN 100.4 F (38 C) OR HIGHER *CHILLS OR SWEATING *NAUSEA AND VOMITING THAT IS NOT CONTROLLED WITH YOUR NAUSEA MEDICATION *UNUSUAL SHORTNESS OF BREATH *UNUSUAL BRUISING OR BLEEDING *URINARY PROBLEMS (pain or burning when urinating, or frequent urination) *BOWEL PROBLEMS (unusual diarrhea, constipation, pain near the anus) TENDERNESS IN MOUTH AND THROAT WITH OR WITHOUT PRESENCE OF ULCERS (sore throat, sores in mouth, or a toothache) UNUSUAL RASH, SWELLING OR PAIN  UNUSUAL VAGINAL DISCHARGE OR ITCHING   Items with * indicate a potential emergency and should be followed up as soon as possible or go to the Emergency Department if any problems should occur.  Please show the CHEMOTHERAPY ALERT CARD or IMMUNOTHERAPY ALERT CARD at check-in to  the Emergency Department and triage nurse.  Should you have questions after your visit or need to cancel or reschedule your appointment, please contact [facility] CANCER [place] AT [place]  [phone] and follow the prompts.  Office hours are 8:00 a.m. to 4:30 p.m. Monday - Friday. Please note that voicemails left after 4:00 p.m. may not be returned until the following business day.  We are closed weekends and major holidays. You have access to a nurse at all times for urgent questions. Please call the main number to the clinic [phone] and follow the prompts.  For any non-urgent questions, you may also contact your provider using MyChart. We now offer e-Visits for anyone 81 and older to request care online for non-urgent symptoms. For details visit [email or web address].   Also download the MyChart app! Go to the app store, search "MyChart", open the app, select [place], and log in with your MyChart username and password.  Masks are optional in the cancer centers. If you would like for your care team to wear a mask while they are taking care of you, please let them know. For doctor visits, patients may have with them one support person who is at least 62 years old. At this time, visitors are not allowed in the infusion area.

## 2021-09-14 NOTE — Progress Notes (Signed)
   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  This was followed by a PET CT scan which showed extensive FDG avid adenopathy in the neck chest abdomen and pelvis.  FDG avid splenic lesions.  Solitary intramuscular FDG avid lesion in the right biceps femoris muscle.   Supraclavicular excisional lymph node biopsy showed high-grade B-cell lymphoma germinal center type Ki-67 greater than 95%.  FISH testing was positive for Bcl-2 BCL6 and MYC consistent with triple hit lymphoma.  By NCCN IPI score would be 4 based on age and elevated LDH and stage IV (intramuscular biceps femoris lesion) which puts him in the high intermediate risk group. 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   Patient received RCHOP for cycle 1 with plans for DA REPOCH with cycle 2. IT MTX for CNS prophylaxis and he has received 3 cycles but declined further cycles.  MRI brain negative for lymphoma.  Chemotherapy was not being able to escalated upwards due to neutropenia which has persisted to 3 weeks and he has been receiving treatment every  4 weeks   PET CT scan after 3 cycles of chemotherapy showedComplete or near complete metabolic response.  Residual disease in the anterior upper right mediastinum slightly greater than background mediastinal activity   Patient found to have a clinically palpable right supraclavicular lymph node which was subsequently biopsied and was consistent with previously diagnosed lymphoma.  PET CT scan showedNew right level 5 adenopathy in the lower neck with dominant lymph node 1.6 cm [place] 5.  No other findings of acute active lymphoma in chest abdomen pelvis and bones.  MRI brain negative for leptomeningeal disease.   Patient seen for second opinion by Dr. Dittus at [facility] for consideration of CAR-T cell therapy.  Patient did not wish to proceed with CAR-T cell treatment after reviewing risks versus benefits.  Moreover patient's health insurance will also not cover CAR-T cell therapy.    Interval history-patient is tolerating chemotherapy well and denies any significant side effects.  Reports no nausea vomiting diarrhea skin rash or fatigue.  Appetite and weight have remained stable  ECOG PS- 0 Pain scale- 0.   Review of systems- Review of Systems  Constitutional:  Negative for chills, fever, malaise/fatigue and weight loss.  HENT:  Negative for congestion, ear discharge and nosebleeds.   Eyes:  Negative for blurred vision.  Respiratory:  Negative for cough, hemoptysis, sputum production, shortness of breath and wheezing.   Cardiovascular:  Negative for chest pain, palpitations, orthopnea and claudication.  Gastrointestinal:  Negative for abdominal pain, blood in stool, constipation, diarrhea, heartburn, melena, nausea and vomiting.  Genitourinary:  Negative for dysuria, flank pain, frequency, hematuria and urgency.  Musculoskeletal:  Negative   for back pain, joint pain and myalgias.  Skin:  Negative for rash.  Neurological:  Negative for dizziness, tingling, focal weakness, seizures, weakness and  headaches.  Endo/Heme/Allergies:  Does not bruise/bleed easily.  Psychiatric/Behavioral:  Negative for depression and suicidal ideas. The patient does not have insomnia.       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     Past Surgical History:  Procedure Laterality Date   BONE MARROW BIOPSY     COLONOSCOPY     EXCISION MASS NECK Right 06/12/2020   Procedure: EXCISION MASS NECK;  Surgeon: Jules Husbands, MD;  Location: [facility] ORS;  Service: General;  Laterality: Right;   HERNIA REPAIR Right    at age 65-RIH   58 [place] Right 06/24/2020   Procedure: INSERTION PORT-A-CATH;  Surgeon: Jules Husbands, MD;  Location: [facility] ORS;  Service: General;  Laterality: Right;    Social History   Socioeconomic History   Marital status: Single    Spouse name: Not on file   Number of children: Not on file   Years of education: Not on file   Highest education level: Not on file  Occupational History   Not on file  Tobacco Use   Smoking status: Former    Types: Cigarettes    Quit date: 06/13/2020    Years since quitting: 1.2   Smokeless tobacco: Never  Vaping Use   Vaping Use: Never used  Substance and Sexual Activity   Alcohol use: Not Currently   Drug use: Not Currently    Types: Marijuana   Sexual activity: Not Currently  Other Topics Concern   Not on file  Social History Narrative   Has daughter and grandson in the home   Social Determinants of Health   Financial Resource Strain: Not on file  Food Insecurity: Not on file  Transportation Needs: Not on file  Physical Activity: Not on file  Stress: Not on file  Social Connections: Not on file  Intimate Partner Violence: Not on file    Family History  Problem Relation Age of Onset   Anemia Mother    Hypertension Mother    Goiter Mother    Cancer Father     Cancer Sister    Multiple sclerosis Sister      Current Outpatient Medications:    allopurinol (ZYLOPRIM) 300 MG tablet, Take 1 tablet (300 mg total) by mouth daily. (Patient not taking: Reported on 08/28/2021), Disp: 30 tablet, Rfl: 1   aspirin EC 81 MG tablet, Take 1 tablet (81 mg total) by mouth daily. Swallow whole., Disp: 30 tablet, Rfl: 0   atenolol (TENORMIN) 25 MG tablet, Take 25 mg by mouth daily., Disp: , Rfl:    cholecalciferol (VITAMIN D3) 25 MCG (1000 UNIT) tablet, Take 1,000 Units by mouth daily., Disp: , Rfl:    lenalidomide (REVLIMID) 15 MG capsule, Take 1 capsule (15 mg total) by mouth daily. Take for 21 days, then hold for 7 days. Repeat every 28 days., Disp: 21 capsule, Rfl: 0   lidocaine-prilocaine (EMLA) cream, Apply to affected area once, Disp: 30 g, Rfl: 3   lisinopril (ZESTRIL) 10 MG tablet, Take 10 mg by mouth daily., Disp: , Rfl:    Omega-3 Fatty Acids (FISH OIL ADULT GUMMIES PO), Take by mouth., Disp: ,  Rfl:    ondansetron (ZOFRAN) 8 MG tablet, Take 1 tablet (8 mg total) by mouth 2 (two) times daily as needed for nausea (Nausea or vomiting)., Disp: 30 tablet, Rfl: 1   prochlorperazine (COMPAZINE) 10 MG tablet, Take 1 tablet (10 mg total) by mouth every 6 (six) hours as needed (Nausea or vomiting). (Patient not taking: Reported on 08/28/2021), Disp: 30 tablet, Rfl: 1   vitamin B-12 (CYANOCOBALAMIN) 100 MCG tablet, Take 100 mcg by mouth daily. (Patient not taking: Reported on 08/28/2021), Disp: , Rfl:    vitamin C (ASCORBIC ACID) 500 MG tablet, Take 500 mg by mouth daily., Disp: , Rfl:  No current facility-administered medications for this visit.  Facility-Administered Medications Ordered in Other Visits:    0.9 %  sodium chloride infusion, , Intravenous, Continuous, Sindy Guadeloupe, MD, Stopped at 09/09/20 1507   0.9 %  sodium chloride infusion, , Intravenous, Continuous, Cammie Sickle, MD, Stopped at 09/11/20 1502   0.9 %  sodium chloride infusion, , Intravenous,  Continuous, Sindy Guadeloupe, MD, Stopped at 11/06/20 1424   heparin lock flush 100 unit/mL, 500 Units, Intravenous, Once, Sindy Guadeloupe, MD   methotrexate (PF) 12 mg in sodium chloride (PF) 0.9 % INTRATHECAL chemo injection, , Intrathecal, Once, Sindy Guadeloupe, MD  Physical exam:  Vitals:   09/14/21 0840  BP: 115/75  Pulse: 70  Resp: 16  Temp: (!) 97.5 F (36.4 C)  TempSrc: Oral  SpO2: 100%  Weight: 180 lb (81.6 kg)   Physical Exam Cardiovascular:     Rate and Rhythm: Normal rate and regular rhythm.     Heart sounds: Normal heart sounds.  Pulmonary:     Effort: Pulmonary effort is normal.     Breath sounds: Normal breath sounds.  Abdominal:     General: Bowel sounds are normal.     Palpations: Abdomen is soft.  Lymphadenopathy:     Comments: Palpable right supraclavicular adenopathy and there are 2 distinct nodes which are palpable and feel overall stable as compared to the previous visit  Skin:    General: Skin is warm and dry.  Neurological:     Mental Status: He is alert and oriented to person, place, and time.         Latest Ref Rng & Units 08/31/2021    8:26 AM  CMP  Glucose 70 - 99 mg/dL 96   BUN 8 - 23 mg/dL 15   Creatinine 0.61 - 1.24 mg/dL 1.09   Sodium 135 - 145 mmol/L 140   Potassium 3.5 - 5.1 mmol/L 3.8   Chloride 98 - 111 mmol/L 106   CO2 22 - 32 mmol/L 27   Calcium 8.9 - 10.3 mg/dL 8.8   Total Protein 6.5 - 8.1 g/dL 6.6   Total Bilirubin 0.3 - 1.2 mg/dL 0.5   Alkaline Phos 38 - 126 U/L 70   AST 15 - 41 U/L 27   ALT 0 - 44 U/L 36       Latest Ref Rng & Units 09/14/2021    8:14 AM  CBC  WBC 4.0 - 10.5 K/uL 3.8   Hemoglobin 13.0 - 17.0 g/dL 13.3   Hematocrit 39.0 - 52.0 % 38.4   Platelets 150 - 400 K/uL 217     No images are attached to the encounter.  No results found.   Assessment and plan- Patient is a 62 y.o. male  With history of stage IV diffuse large B-cell lymphoma triple hit s/p 6 cycles  of R-EPOCH chemotherapy now with  relapsed/refractory disease within 12 months of initial treatment.  He is here for on treatment assessment prior to cycle 1 day 22 of tafasitamab chemotherapy  Patient was not able to receive day 22 of treatment last weekDue to insurance issues and drug approval.  He now has received the drug from the drug company and will proceed with treatment as interrupted basis.  Today he has mild leukopenia with a white cell count of 3.8 with an ANC of 1.7.  We will proceed with day 22 of tafasitamab chemotherapy today.  He will continue to hold Revlimid this week as well.  Port labs CBC with differential CMP and gets tafasitamab cycle 2-day 1 next week when he will also start his 15 mg Revlimid 3 weeks on and 1 week off.  I will see him back in 2 weeks for cycle 2-day 8 of treatment.  I will plan to get scans after 3 cycles  If neutropenia worsens I will consider giving him Neupogen in between treatments.  Starting cycle 3 he will be receiving the drug every 2 weeks and we can potentially use Neulasta at that time   Visit Diagnosis 1. Encounter for antineoplastic chemotherapy   2. High risk medication use   3. High grade B-cell lymphoma ([place])      Dr. Randa Evens, MD, MPH [facility] [phone] 09/14/2021 9:23 AM

## 2021-09-14 NOTE — Progress Notes (Signed)
Patient tolerated tafasitamab infusion well, no questions/concerns voiced. Patient stable at discharge. AVS given.

## 2021-09-14 NOTE — Progress Notes (Signed)
Pt returns for follow-up. Feels that he is tolerating treatment well. Denies any new concerns.

## 2021-09-21 ENCOUNTER — Inpatient Hospital Stay: Payer: Self-pay

## 2021-09-21 VITALS — BP 124/74 | HR 65 | Temp 98.4°F | Wt 184.0 lb

## 2021-09-21 DIAGNOSIS — C8510 Unspecified B-cell lymphoma, unspecified site: Secondary | ICD-10-CM

## 2021-09-21 LAB — COMPREHENSIVE METABOLIC PANEL
ALT: 23 U/L (ref 0–44)
AST: 36 U/L (ref 15–41)
Albumin: 3.9 g/dL (ref 3.5–5.0)
Alkaline Phosphatase: 85 U/L (ref 38–126)
Anion gap: 8 (ref 5–15)
BUN: 15 mg/dL (ref 8–23)
CO2: 27 mmol/L (ref 22–32)
Calcium: 8.7 mg/dL — ABNORMAL LOW (ref 8.9–10.3)
Chloride: 105 mmol/L (ref 98–111)
Creatinine, Ser: 1.18 mg/dL (ref 0.61–1.24)
GFR, Estimated: >60 mL/min (ref >60)
Glucose, Bld: 114 mg/dL — ABNORMAL HIGH (ref 70–99)
Potassium: 3.7 mmol/L (ref 3.5–5.1)
Sodium: 140 mmol/L (ref 135–145)
Total Bilirubin: 1 mg/dL (ref 0.3–1.2)
Total Protein: 6.5 g/dL (ref 6.5–8.1)

## 2021-09-21 LAB — CBC WITH DIFFERENTIAL/PLATELET
Abs Immature Granulocytes: 0.01 10*3/uL (ref 0.00–0.07)
Basophils Absolute: 0 10*3/uL (ref 0.0–0.1)
Basophils Relative: 1 %
Eosinophils Absolute: 0.1 10*3/uL (ref 0.0–0.5)
Eosinophils Relative: 2 %
HCT: 39.3 % (ref 39.0–52.0)
Hemoglobin: 13.8 g/dL (ref 13.0–17.0)
Immature Granulocytes: 0 %
Lymphocytes Relative: 41 %
Lymphs Abs: 1.5 10*3/uL (ref 0.7–4.0)
MCH: 32.3 pg (ref 26.0–34.0)
MCHC: 35.1 g/dL (ref 30.0–36.0)
MCV: 92 fL (ref 80.0–100.0)
Monocytes Absolute: 0.4 10*3/uL (ref 0.1–1.0)
Monocytes Relative: 11 %
Neutro Abs: 1.7 10*3/uL (ref 1.7–7.7)
Neutrophils Relative %: 45 %
Platelets: 220 10*3/uL (ref 150–400)
RBC: 4.27 MIL/uL (ref 4.22–5.81)
RDW: 13.9 % (ref 11.5–15.5)
WBC: 3.6 10*3/uL — ABNORMAL LOW (ref 4.0–10.5)
nRBC: 0 % (ref 0.0–0.2)

## 2021-09-21 MED ORDER — PROCHLORPERAZINE MALEATE 10 MG PO TABS
10.0000 mg | ORAL_TABLET | Freq: Once | ORAL | Status: AC
  Administered 2021-09-21: 10 mg via ORAL
  Filled 2021-09-21: qty 1

## 2021-09-21 MED ORDER — SODIUM CHLORIDE 0.9 % IV SOLN
Freq: Once | INTRAVENOUS | Status: AC
  Filled 2021-09-21: qty 250

## 2021-09-21 MED ORDER — HEPARIN SOD (PORK) LOCK FLUSH 100 UNIT/ML IV SOLN
500.0000 [IU] | Freq: Once | INTRAVENOUS | Status: AC | PRN
  Administered 2021-09-21: 500 [IU]
  Filled 2021-09-21: qty 5

## 2021-09-21 MED ORDER — TAFASITAMAB-CXIX CHEMO INJECTION 200 MG
12.0000 mg/kg | Freq: Once | INTRAVENOUS | Status: AC
  Administered 2021-09-21: 1000 mg via INTRAVENOUS
  Filled 2021-09-21: qty 25

## 2021-09-21 NOTE — Patient Instructions (Signed)
[facility]  Discharge Instructions: Thank you for choosing [facility] to provide your oncology and hematology care.   If you have a lab appointment with the Cancer Center, please go directly to the Cancer Center and check in at the registration area.   Wear comfortable clothing and clothing appropriate for easy access to any Portacath or PICC line.   We strive to give you quality time with your provider. You may need to reschedule your appointment if you arrive late (15 or more minutes).  Arriving late affects you and other patients whose appointments are after yours.  Also, if you miss three or more appointments without notifying the office, you may be dismissed from the clinic at the provider's discretion.      For prescription refill requests, have your pharmacy contact our office and allow 72 hours for refills to be completed.    Today you received the following chemotherapy and/or immunotherapy agents       To help prevent nausea and vomiting after your treatment, we encourage you to take your nausea medication as directed.  BELOW ARE SYMPTOMS THAT SHOULD BE REPORTED IMMEDIATELY: *FEVER GREATER THAN 100.4 F (38 C) OR HIGHER *CHILLS OR SWEATING *NAUSEA AND VOMITING THAT IS NOT CONTROLLED WITH YOUR NAUSEA MEDICATION *UNUSUAL SHORTNESS OF BREATH *UNUSUAL BRUISING OR BLEEDING *URINARY PROBLEMS (pain or burning when urinating, or frequent urination) *BOWEL PROBLEMS (unusual diarrhea, constipation, pain near the anus) TENDERNESS IN MOUTH AND THROAT WITH OR WITHOUT PRESENCE OF ULCERS (sore throat, sores in mouth, or a toothache) UNUSUAL RASH, SWELLING OR PAIN  UNUSUAL VAGINAL DISCHARGE OR ITCHING   Items with * indicate a potential emergency and should be followed up as soon as possible or go to the Emergency Department if any problems should occur.  Please show the CHEMOTHERAPY ALERT CARD or IMMUNOTHERAPY ALERT CARD at check-in to the  Emergency Department and triage nurse.  Should you have questions after your visit or need to cancel or reschedule your appointment, please contact [facility] [place]  Dept: [phone]  and follow the prompts.  Office hours are 8:00 a.m. to 4:30 p.m. Monday - Friday. Please note that voicemails left after 4:00 p.m. may not be returned until the following business day.  We are closed weekends and major holidays. You have access to a nurse at all times for urgent questions. Please call the main number to the clinic Dept: [phone] and follow the prompts.   For any non-urgent questions, you may also contact your provider using MyChart. We now offer e-Visits for anyone 18 and older to request care online for non-urgent symptoms. For details visit [email or web address].   Also download the MyChart app! Go to the app store, search "MyChart", open the app, select [facility], and log in with your MyChart username and password.  Masks are optional in the cancer centers. If you would like for your care team to wear a mask while they are taking care of you, please let them know. For doctor visits, patients may have with them one support person who is at least 62 years old. At this time, visitors are not allowed in the infusion area. 

## 2021-09-24 MED ORDER — LENALIDOMIDE 15 MG PO CAPS: 15.0000 mg | ORAL_CAPSULE | Freq: Every day | ORAL | 0 refills | Status: DC

## 2021-09-28 ENCOUNTER — Encounter: Payer: Self-pay | Admitting: Oncology

## 2021-09-28 ENCOUNTER — Inpatient Hospital Stay: Payer: Self-pay

## 2021-09-28 ENCOUNTER — Inpatient Hospital Stay (HOSPITAL_BASED_OUTPATIENT_CLINIC_OR_DEPARTMENT_OTHER): Payer: Self-pay | Admitting: Oncology

## 2021-09-28 VITALS — BP 110/68 | HR 59 | Temp 98.1°F | Resp 16 | Ht 73.0 in | Wt 183.0 lb

## 2021-09-28 DIAGNOSIS — Z5111 Encounter for antineoplastic chemotherapy: Secondary | ICD-10-CM

## 2021-09-28 DIAGNOSIS — Z79899 Other long term (current) drug therapy: Secondary | ICD-10-CM

## 2021-09-28 DIAGNOSIS — C8510 Unspecified B-cell lymphoma, unspecified site: Secondary | ICD-10-CM

## 2021-09-28 LAB — COMPREHENSIVE METABOLIC PANEL
ALT: 29 U/L (ref 0–44)
AST: 35 U/L (ref 15–41)
Albumin: 4 g/dL (ref 3.5–5.0)
Alkaline Phosphatase: 81 U/L (ref 38–126)
Anion gap: 6 (ref 5–15)
BUN: 17 mg/dL (ref 8–23)
CO2: 25 mmol/L (ref 22–32)
Calcium: 8.9 mg/dL (ref 8.9–10.3)
Chloride: 108 mmol/L (ref 98–111)
Creatinine, Ser: 1.17 mg/dL (ref 0.61–1.24)
GFR, Estimated: >60 mL/min (ref >60)
Glucose, Bld: 147 mg/dL — ABNORMAL HIGH (ref 70–99)
Potassium: 3.6 mmol/L (ref 3.5–5.1)
Sodium: 139 mmol/L (ref 135–145)
Total Bilirubin: 0.8 mg/dL (ref 0.3–1.2)
Total Protein: 6.7 g/dL (ref 6.5–8.1)

## 2021-09-28 LAB — CBC WITH DIFFERENTIAL/PLATELET
Abs Immature Granulocytes: 0.02 10*3/uL (ref 0.00–0.07)
Basophils Absolute: 0 10*3/uL (ref 0.0–0.1)
Basophils Relative: 0 %
Eosinophils Absolute: 0.2 10*3/uL (ref 0.0–0.5)
Eosinophils Relative: 5 %
HCT: 39.4 % (ref 39.0–52.0)
Hemoglobin: 13.8 g/dL (ref 13.0–17.0)
Immature Granulocytes: 1 %
Lymphocytes Relative: 41 %
Lymphs Abs: 1.6 10*3/uL (ref 0.7–4.0)
MCH: 32.5 pg (ref 26.0–34.0)
MCHC: 35 g/dL (ref 30.0–36.0)
MCV: 92.7 fL (ref 80.0–100.0)
Monocytes Absolute: 0.4 10*3/uL (ref 0.1–1.0)
Monocytes Relative: 11 %
Neutro Abs: 1.6 10*3/uL — ABNORMAL LOW (ref 1.7–7.7)
Neutrophils Relative %: 42 %
Platelets: 231 10*3/uL (ref 150–400)
RBC: 4.25 MIL/uL (ref 4.22–5.81)
RDW: 14.4 % (ref 11.5–15.5)
WBC: 3.8 10*3/uL — ABNORMAL LOW (ref 4.0–10.5)
nRBC: 0 % (ref 0.0–0.2)

## 2021-09-28 MED ORDER — SODIUM CHLORIDE 0.9 % IV SOLN
Freq: Once | INTRAVENOUS | Status: AC
  Filled 2021-09-28: qty 250

## 2021-09-28 MED ORDER — SODIUM CHLORIDE 0.9% FLUSH
10.0000 mL | Freq: Once | INTRAVENOUS | Status: AC
  Administered 2021-09-28: 10 mL via INTRAVENOUS
  Filled 2021-09-28: qty 10

## 2021-09-28 MED ORDER — ALLOPURINOL 300 MG PO TABS: 300.0000 mg | ORAL_TABLET | Freq: Every day | ORAL | 1 refills | Status: DC

## 2021-09-28 MED ORDER — TAFASITAMAB-CXIX CHEMO INJECTION 200 MG
12.0000 mg/kg | Freq: Once | INTRAVENOUS | Status: AC
  Administered 2021-09-28: 1000 mg via INTRAVENOUS
  Filled 2021-09-28: qty 25

## 2021-09-28 MED ORDER — PROCHLORPERAZINE MALEATE 10 MG PO TABS
10.0000 mg | ORAL_TABLET | Freq: Once | ORAL | Status: AC
  Administered 2021-09-28: 10 mg via ORAL
  Filled 2021-09-28: qty 1

## 2021-09-28 MED ORDER — HEPARIN SOD (PORK) LOCK FLUSH 100 UNIT/ML IV SOLN
500.0000 [IU] | Freq: Once | INTRAVENOUS | Status: AC | PRN
  Administered 2021-09-28: 500 [IU]
  Filled 2021-09-28: qty 5

## 2021-09-28 NOTE — Progress Notes (Signed)
Concerns?

## 2021-09-28 NOTE — Patient Instructions (Signed)
[facility] AT [place]  Discharge Instructions: Thank you for choosing [facility] to provide your oncology and hematology care.  If you have a lab appointment with the [facility], please go directly to the [facility] and check in at the registration area.  Wear comfortable clothing and clothing appropriate for easy access to any Portacath or PICC line.   We strive to give you quality time with your provider. You may need to reschedule your appointment if you arrive late (15 or more minutes).  Arriving late affects you and other patients whose appointments are after yours.  Also, if you miss three or more appointments without notifying the office, you may be dismissed from the clinic at the provider's discretion.      For prescription refill requests, have your pharmacy contact our office and allow 72 hours for refills to be completed.    Today you received the following chemotherapy and/or immunotherapy agents: [id number]    To help prevent nausea and vomiting after your treatment, we encourage you to take your nausea medication as directed.  BELOW ARE SYMPTOMS THAT SHOULD BE REPORTED IMMEDIATELY: *FEVER GREATER THAN 100.4 F (38 C) OR HIGHER *CHILLS OR SWEATING *NAUSEA AND VOMITING THAT IS NOT CONTROLLED WITH YOUR NAUSEA MEDICATION *UNUSUAL SHORTNESS OF BREATH *UNUSUAL BRUISING OR BLEEDING *URINARY PROBLEMS (pain or burning when urinating, or frequent urination) *BOWEL PROBLEMS (unusual diarrhea, constipation, pain near the anus) TENDERNESS IN MOUTH AND THROAT WITH OR WITHOUT PRESENCE OF ULCERS (sore throat, sores in mouth, or a toothache) UNUSUAL RASH, SWELLING OR PAIN  UNUSUAL VAGINAL DISCHARGE OR ITCHING   Items with * indicate a potential emergency and should be followed up as soon as possible or go to the Emergency Department if any problems should occur.  Please show the CHEMOTHERAPY ALERT CARD or IMMUNOTHERAPY ALERT CARD at  check-in to the Emergency Department and triage nurse.  Should you have questions after your visit or need to cancel or reschedule your appointment, please contact [facility] [place] AT [place]  [phone] and follow the prompts.  Office hours are 8:00 a.m. to 4:30 p.m. Monday - Friday. Please note that voicemails left after 4:00 p.m. may not be returned until the following business day.  We are closed weekends and major holidays. You have access to a nurse at all times for urgent questions. Please call the main number to the clinic [phone] and follow the prompts.  For any non-urgent questions, you may also contact your provider using MyChart. We now offer e-Visits for anyone 16 and older to request care online for non-urgent symptoms. For details visit [email or web address].   Also download the MyChart app! Go to the app store, search "MyChart", open the app, select [facility], and log in with your MyChart username and password.  Masks are optional in the cancer centers. If you would like for your care team to wear a mask while they are taking care of you, please let them know. For doctor visits, patients may have with them one support person who is at least 62 years old. At this time, visitors are not allowed in the infusion area.

## 2021-09-28 NOTE — Progress Notes (Signed)
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  This was followed by a PET CT scan which showed extensive FDG avid adenopathy in the neck chest abdomen and pelvis.  FDG avid splenic lesions.  Solitary intramuscular FDG avid lesion in the right biceps femoris muscle.   Supraclavicular excisional lymph node biopsy showed high-grade B-cell lymphoma germinal center type Ki-67 greater than 95%.  FISH testing was positive for Bcl-2 BCL6 and MYC consistent with triple hit lymphoma.  By NCCN IPI score would be 4 based on age and elevated LDH and stage IV (intramuscular biceps femoris lesion) which puts him in the high intermediate risk group. 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   Patient received RCHOP for cycle 1 with plans for [facility] with cycle 2. IT MTX for CNS prophylaxis and he has received 3 cycles but declined further cycles.  MRI brain negative for lymphoma.  Chemotherapy was not being able to escalated upwards due to neutropenia which has persisted to 3 weeks and he has been receiving  treatment every 4 weeks   PET CT scan after 3 cycles of chemotherapy showedComplete or near complete metabolic response.  Residual disease in the anterior upper right mediastinum slightly greater than background mediastinal activity   Patient found to have a clinically palpable right supraclavicular lymph node which was subsequently biopsied and was consistent with previously diagnosed lymphoma.  PET CT scan showedNew right level 5 adenopathy in the lower neck with dominant lymph node 1.6 cm [place] 5.  No other findings of acute active lymphoma in chest abdomen pelvis and bones.  MRI brain negative for leptomeningeal disease.   Patient seen for second opinion by Dr. Lonia Blood at [facility] for consideration of CAR-T cell therapy.  Patient did not wish to proceed with CAR-T cell treatment after reviewing risks versus benefits.  Moreover patient's health insurance will also not cover CAR-T cell therapy. Patient started on second line tafasitamab/Revlimid regimen in June 2023.  He is on Revlimid 15 mg 3 weeks on and 1 week off  Interval history-tolerating chemotherapy well so far.  Denies any significant nausea or vomiting.  Energy levels have been stable.  He remains independent of his ADLs and IADLs.  He has good appetite and has not had any unintentional weight loss.  ECOG PS- 0 Pain scale- 0   Review of systems- Review of Systems  Constitutional:  Negative for chills, fever, malaise/fatigue and weight loss.  HENT:  Negative for congestion, ear discharge and nosebleeds.   Eyes:  Negative for blurred vision.  Respiratory:  Negative for cough, hemoptysis, sputum production, shortness of breath and wheezing.   Cardiovascular:  Negative for chest pain, palpitations, orthopnea and  claudication.  Gastrointestinal:  Negative for abdominal pain, blood in stool, constipation, diarrhea, heartburn, melena, nausea and vomiting.  Genitourinary:  Negative for dysuria, flank pain, frequency, hematuria and urgency.   Musculoskeletal:  Negative for back pain, joint pain and myalgias.  Skin:  Negative for rash.  Neurological:  Negative for dizziness, tingling, focal weakness, seizures, weakness and headaches.  Endo/Heme/Allergies:  Does not bruise/bleed easily.  Psychiatric/Behavioral:  Negative for depression and suicidal ideas. The patient does not have insomnia.       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     Past Surgical History:  Procedure Laterality Date   BONE MARROW BIOPSY     COLONOSCOPY     EXCISION MASS NECK Right 06/12/2020   Procedure: EXCISION MASS NECK;  Surgeon: Jules Husbands, MD;  Location: [facility] ORS;  Service: General;  Laterality: Right;   HERNIA REPAIR Right    at age 8-RIH   48 [place] Right 06/24/2020   Procedure: INSERTION PORT-A-CATH;  Surgeon: Jules Husbands, MD;  Location: [facility] ORS;  Service: General;  Laterality: Right;    Social History   Socioeconomic History   Marital status: Single    Spouse name: Not on file   Number of children: Not on file   Years of education: Not on file   Highest education level: Not on file  Occupational History   Not on file  Tobacco Use   Smoking status: Former    Types: Cigarettes    Quit date: 06/13/2020    Years since quitting: 1.2   Smokeless tobacco: Never  Vaping Use   Vaping Use: Never used  Substance and Sexual Activity   Alcohol use: Not Currently   Drug use: Not Currently    Types: Marijuana   Sexual activity: Not Currently  Other Topics Concern   Not on file  Social History Narrative   Has daughter and grandson in the home   Social Determinants of Health   Financial Resource Strain: Not on file  Food Insecurity: Not on file  Transportation Needs: Not on file  Physical Activity: Not on file  Stress: Not on file  Social Connections:  Not on file  Intimate Partner Violence: Not on file    Family History  Problem Relation Age of Onset   Anemia Mother    Hypertension Mother    Goiter Mother    Cancer Father    Cancer Sister    Multiple sclerosis Sister      Current Outpatient Medications:    aspirin EC 81 MG tablet, Take 1 tablet (81 mg total) by mouth daily. Swallow whole., Disp: 30 tablet, Rfl: 0   atenolol (TENORMIN) 25 MG tablet, Take 25 mg by mouth daily., Disp: , Rfl:    cholecalciferol (VITAMIN D3) 25 MCG (1000 UNIT) tablet, Take 1,000 Units by mouth daily., Disp: , Rfl:    lenalidomide (REVLIMID) 15 MG capsule, Take 1 capsule (15 mg total) by mouth daily. Take for 21 days, then hold for 7 days. Repeat every 28 days., Disp: 21 capsule, Rfl: 0   lisinopril (ZESTRIL) 10 MG tablet, Take 10 mg by mouth daily., Disp: , Rfl:    Omega-3 Fatty Acids (FISH OIL ADULT GUMMIES PO), Take by mouth., Disp: , Rfl:    vitamin B-12 (CYANOCOBALAMIN)  100 MCG tablet, Take 100 mcg by mouth daily., Disp: , Rfl:    vitamin C (ASCORBIC ACID) 500 MG tablet, Take 500 mg by mouth daily., Disp: , Rfl:    allopurinol (ZYLOPRIM) 300 MG tablet, Take 1 tablet (300 mg total) by mouth daily. (Patient not taking: Reported on 08/28/2021), Disp: 30 tablet, Rfl: 1   lidocaine-prilocaine (EMLA) cream, Apply to affected area once (Patient not taking: Reported on 09/28/2021), Disp: 30 g, Rfl: 3   ondansetron (ZOFRAN) 8 MG tablet, Take 1 tablet (8 mg total) by mouth 2 (two) times daily as needed for nausea (Nausea or vomiting). (Patient not taking: Reported on 09/28/2021), Disp: 30 tablet, Rfl: 1   prochlorperazine (COMPAZINE) 10 MG tablet, Take 1 tablet (10 mg total) by mouth every 6 (six) hours as needed (Nausea or vomiting). (Patient not taking: Reported on 08/28/2021), Disp: 30 tablet, Rfl: 1 No current facility-administered medications for this visit.  Facility-Administered Medications Ordered in Other Visits:    0.9 %  sodium chloride infusion, ,  Intravenous, Continuous, Sindy Guadeloupe, MD, Stopped at 09/09/20 1507   0.9 %  sodium chloride infusion, , Intravenous, Continuous, Charlaine Dalton R, MD, Stopped at 09/11/20 1502   0.9 %  sodium chloride infusion, , Intravenous, Continuous, Sindy Guadeloupe, MD, Stopped at 11/06/20 1424   heparin lock flush 100 unit/mL, 500 Units, Intravenous, Once, Sindy Guadeloupe, MD   methotrexate (PF) 12 mg in sodium chloride (PF) 0.9 % INTRATHECAL chemo injection, , Intrathecal, Once, Sindy Guadeloupe, MD  Physical exam:  Vitals:   09/28/21 0850  BP: 110/68  Pulse: (!) 59  Resp: 16  Temp: 98.1 F (36.7 C)  Weight: 183 lb (83 kg)  Height: _0  (1.854 m)   Physical Exam Cardiovascular:     Rate and Rhythm: Normal rate and regular rhythm.     Heart sounds: Normal heart sounds.  Pulmonary:     Effort: Pulmonary effort is normal.     Breath sounds: Normal breath sounds.  Abdominal:     General: Bowel sounds are normal.     Palpations: Abdomen is soft.  Lymphadenopathy:     Comments: Palpable right supraclavicular adenopathy and the second lymph node appears larger on today's exam.  No palpable bilateral axillary cervical or inguinal adenopathy.  Skin:    General: Skin is warm and dry.  Neurological:     Mental Status: He is alert and oriented to person, place, and time.         Latest Ref Rng & Units 09/28/2021    8:19 AM  CMP  Glucose 70 - 99 mg/dL 147   BUN 8 - 23 mg/dL 17   Creatinine 0.61 - 1.24 mg/dL 1.17   Sodium 135 - 145 mmol/L 139   Potassium 3.5 - 5.1 mmol/L 3.6   Chloride 98 - 111 mmol/L 108   CO2 22 - 32 mmol/L 25   Calcium 8.9 - 10.3 mg/dL 8.9   Total Protein 6.5 - 8.1 g/dL 6.7   Total Bilirubin 0.3 - 1.2 mg/dL 0.8   Alkaline Phos 38 - 126 U/L 81   AST 15 - 41 U/L 35   ALT 0 - 44 U/L 29       Latest Ref Rng & Units 09/28/2021    8:19 AM  CBC  WBC 4.0 - 10.5 K/uL 3.8   Hemoglobin 13.0 - 17.0 g/dL 13.8   Hematocrit 39.0 - 52.0 % 39.4   Platelets 150 - 400 K/uL  231      Assessment and plan- Patient is a 61 y.o. male With history of stage IV diffuse large B-cell lymphoma triple hit s/p 6 cycles of R-EPOCH chemotherapy now with relapsed/refractory disease within 12 months of initial treatment.  He is here for on treatment assessment prior to cycle 2-day 8 of tafasitamab chemotherapy in combination with Revlimid  Counts okay to proceed with cycle 2-day 8 of tafasitamab chemotherapy today.  White count is mildly low at 3.8 but ANC remains greater than 1.5.  He will directly proceed for cycle 2-day 15 of treatment next week and I will see him back in 2 weeks for cycle 2-day 22.  Also continue Revlimid at 15 mg 3 weeks on and 1 week off.  I am concerned that his right supraclavicular adenopathy is mildly increasing in size.  I will plan to get a repeat PET scan in 2 weeks.  If that is the only site of progression I will consider offering him palliative radiation to that area as well.  Continue Revlimid 3 weeks on and 1 week off  Continue aspirin and allopurinol prophylaxis.  Hepatitis B core antibody positive: Patient was supposed to be on tenofovir prophylaxis but due to his insurance issues he was unable to procure the drug in the past.  So far his LFTs are normal but I will look into getting tenofovir for him again.   Visit Diagnosis 1. Encounter for antineoplastic chemotherapy   2. High risk medication use   3. High grade B-cell lymphoma ([place])      Dr. Randa Evens, MD, MPH [facility] [phone] 09/28/2021 8:51 AM

## 2021-10-05 ENCOUNTER — Other Ambulatory Visit: Payer: Self-pay

## 2021-10-05 ENCOUNTER — Inpatient Hospital Stay: Payer: Self-pay

## 2021-10-05 VITALS — BP 123/86 | HR 64 | Temp 96.0°F | Wt 186.1 lb

## 2021-10-05 DIAGNOSIS — C8510 Unspecified B-cell lymphoma, unspecified site: Secondary | ICD-10-CM

## 2021-10-05 LAB — CBC WITH DIFFERENTIAL/PLATELET
Abs Immature Granulocytes: 0.02 10*3/uL (ref 0.00–0.07)
Basophils Absolute: 0 10*3/uL (ref 0.0–0.1)
Basophils Relative: 1 %
Eosinophils Absolute: 0.2 10*3/uL (ref 0.0–0.5)
Eosinophils Relative: 5 %
HCT: 38 % — ABNORMAL LOW (ref 39.0–52.0)
Hemoglobin: 13.3 g/dL (ref 13.0–17.0)
Immature Granulocytes: 0 %
Lymphocytes Relative: 40 %
Lymphs Abs: 1.9 10*3/uL (ref 0.7–4.0)
MCH: 32.3 pg (ref 26.0–34.0)
MCHC: 35 g/dL (ref 30.0–36.0)
MCV: 92.2 fL (ref 80.0–100.0)
Monocytes Absolute: 0.6 10*3/uL (ref 0.1–1.0)
Monocytes Relative: 13 %
Neutro Abs: 2 10*3/uL (ref 1.7–7.7)
Neutrophils Relative %: 41 %
Platelets: 246 10*3/uL (ref 150–400)
RBC: 4.12 MIL/uL — ABNORMAL LOW (ref 4.22–5.81)
RDW: 14 % (ref 11.5–15.5)
WBC: 4.8 10*3/uL (ref 4.0–10.5)
nRBC: 0 % (ref 0.0–0.2)

## 2021-10-05 LAB — COMPREHENSIVE METABOLIC PANEL
ALT: 27 U/L (ref 0–44)
AST: 35 U/L (ref 15–41)
Albumin: 4 g/dL (ref 3.5–5.0)
Alkaline Phosphatase: 84 U/L (ref 38–126)
Anion gap: 6 (ref 5–15)
BUN: 15 mg/dL (ref 8–23)
CO2: 26 mmol/L (ref 22–32)
Calcium: 9 mg/dL (ref 8.9–10.3)
Chloride: 108 mmol/L (ref 98–111)
Creatinine, Ser: 1.03 mg/dL (ref 0.61–1.24)
GFR, Estimated: >60 mL/min (ref >60)
Glucose, Bld: 128 mg/dL — ABNORMAL HIGH (ref 70–99)
Potassium: 3.5 mmol/L (ref 3.5–5.1)
Sodium: 140 mmol/L (ref 135–145)
Total Bilirubin: 0.5 mg/dL (ref 0.3–1.2)
Total Protein: 6.8 g/dL (ref 6.5–8.1)

## 2021-10-05 MED ORDER — PROCHLORPERAZINE MALEATE 10 MG PO TABS
10.0000 mg | ORAL_TABLET | Freq: Once | ORAL | Status: AC
  Administered 2021-10-05: 10 mg via ORAL
  Filled 2021-10-05: qty 1

## 2021-10-05 MED ORDER — HEPARIN SOD (PORK) LOCK FLUSH 100 UNIT/ML IV SOLN
INTRAVENOUS | Status: AC
  Administered 2021-10-05: 500 [IU]
  Filled 2021-10-05: qty 5

## 2021-10-05 MED ORDER — TAFASITAMAB-CXIX CHEMO INJECTION 200 MG
12.0000 mg/kg | Freq: Once | INTRAVENOUS | Status: AC
  Administered 2021-10-05: 1000 mg via INTRAVENOUS
  Filled 2021-10-05: qty 25

## 2021-10-05 MED ORDER — HEPARIN SOD (PORK) LOCK FLUSH 100 UNIT/ML IV SOLN
500.0000 [IU] | Freq: Once | INTRAVENOUS | Status: AC | PRN
  Filled 2021-10-05: qty 5

## 2021-10-05 MED ORDER — SODIUM CHLORIDE 0.9 % IV SOLN
Freq: Once | INTRAVENOUS | Status: AC
  Filled 2021-10-05: qty 250

## 2021-10-05 NOTE — Patient Instructions (Addendum)
[facility] [address] [facility] [place]  Discharge Instructions: Thank you for choosing [facility] to provide your oncology and hematology care.  If you have a lab appointment with the [facility], please go directly to the [facility] and check in at the registration area.  Wear comfortable clothing and clothing appropriate for easy access to any Portacath or PICC line.   We strive to give you quality time with your provider. You may need to reschedule your appointment if you arrive late (15 or more minutes).  Arriving late affects you and other patients whose appointments are after yours.  Also, if you miss three or more appointments without notifying the office, you may be dismissed from the clinic at the provider's discretion.      For prescription refill requests, have your pharmacy contact our office and allow 72 hours for refills to be completed.    Today you received the following chemotherapy and/or immunotherapy agents Monjuvi   To help prevent nausea and vomiting after your treatment, we encourage you to take your nausea medication as directed.  BELOW ARE SYMPTOMS THAT SHOULD BE REPORTED IMMEDIATELY: *FEVER GREATER THAN 100.4 F (38 C) OR HIGHER *CHILLS OR SWEATING *NAUSEA AND VOMITING THAT IS NOT CONTROLLED WITH YOUR NAUSEA MEDICATION *UNUSUAL SHORTNESS OF BREATH *UNUSUAL BRUISING OR BLEEDING *URINARY PROBLEMS (pain or burning when urinating, or frequent urination) *BOWEL PROBLEMS (unusual diarrhea, constipation, pain near the anus) TENDERNESS IN MOUTH AND THROAT WITH OR WITHOUT PRESENCE OF ULCERS (sore throat, sores in mouth, or a toothache) UNUSUAL RASH, SWELLING OR PAIN  UNUSUAL VAGINAL DISCHARGE OR ITCHING   Items with * indicate a potential emergency and should be followed up as soon as possible or go to the Emergency Department if any problems should occur.  Please show the CHEMOTHERAPY ALERT CARD or IMMUNOTHERAPY ALERT CARD at check-in to the  Emergency Department and triage nurse.  Should you have questions after your visit or need to cancel or reschedule your appointment, please contact [facility] [place] AT [place]  [phone] and follow the prompts.  Office hours are 8:00 a.m. to 4:30 p.m. Monday - Friday. Please note that voicemails left after 4:00 p.m. may not be returned until the following business day.  We are closed weekends and major holidays. You have access to a nurse at all times for urgent questions. Please call the main number to the clinic [phone] and follow the prompts.  For any non-urgent questions, you may also contact your provider using MyChart. We now offer e-Visits for anyone 61 and older to request care online for non-urgent symptoms. For details visit [email or web address].   Also download the MyChart app! Go to the app store, search "MyChart", open the app, select [facility], and log in with your MyChart username and password.  Masks are optional in the cancer centers. If you would like for your care team to wear a mask while they are taking care of you, please let them know. For doctor visits, patients may have with them one support person who is at least 62 years old. At this time, visitors are not allowed in the infusion area.

## 2021-10-06 ENCOUNTER — Other Ambulatory Visit: Payer: Medicaid Other

## 2021-10-09 ENCOUNTER — Other Ambulatory Visit: Payer: Medicaid Other

## 2021-10-09 ENCOUNTER — Ambulatory Visit: Payer: Medicaid Other | Admitting: Oncology

## 2021-10-12 ENCOUNTER — Inpatient Hospital Stay (HOSPITAL_BASED_OUTPATIENT_CLINIC_OR_DEPARTMENT_OTHER): Payer: Self-pay | Admitting: Oncology

## 2021-10-12 ENCOUNTER — Inpatient Hospital Stay: Payer: Self-pay

## 2021-10-12 ENCOUNTER — Encounter: Payer: Self-pay | Admitting: Oncology

## 2021-10-12 VITALS — BP 149/89 | HR 49 | Temp 96.9°F | Resp 16 | Ht 73.0 in | Wt 186.3 lb

## 2021-10-12 VITALS — BP 146/79 | HR 49

## 2021-10-12 DIAGNOSIS — Z5111 Encounter for antineoplastic chemotherapy: Secondary | ICD-10-CM

## 2021-10-12 DIAGNOSIS — C8510 Unspecified B-cell lymphoma, unspecified site: Secondary | ICD-10-CM

## 2021-10-12 LAB — CBC WITH DIFFERENTIAL/PLATELET
Abs Immature Granulocytes: 0.01 10*3/uL (ref 0.00–0.07)
Basophils Absolute: 0 10*3/uL (ref 0.0–0.1)
Basophils Relative: 1 %
Eosinophils Absolute: 0.2 10*3/uL (ref 0.0–0.5)
Eosinophils Relative: 4 %
HCT: 38 % — ABNORMAL LOW (ref 39.0–52.0)
Hemoglobin: 13.2 g/dL (ref 13.0–17.0)
Immature Granulocytes: 0 %
Lymphocytes Relative: 50 %
Lymphs Abs: 2.4 10*3/uL (ref 0.7–4.0)
MCH: 32 pg (ref 26.0–34.0)
MCHC: 34.7 g/dL (ref 30.0–36.0)
MCV: 92.2 fL (ref 80.0–100.0)
Monocytes Absolute: 0.8 10*3/uL (ref 0.1–1.0)
Monocytes Relative: 16 %
Neutro Abs: 1.4 10*3/uL — ABNORMAL LOW (ref 1.7–7.7)
Neutrophils Relative %: 29 %
Platelets: 248 10*3/uL (ref 150–400)
RBC: 4.12 MIL/uL — ABNORMAL LOW (ref 4.22–5.81)
RDW: 14.3 % (ref 11.5–15.5)
WBC: 4.8 10*3/uL (ref 4.0–10.5)
nRBC: 0 % (ref 0.0–0.2)

## 2021-10-12 LAB — COMPREHENSIVE METABOLIC PANEL
ALT: 29 U/L (ref 0–44)
AST: 38 U/L (ref 15–41)
Albumin: 4 g/dL (ref 3.5–5.0)
Alkaline Phosphatase: 83 U/L (ref 38–126)
Anion gap: 7 (ref 5–15)
BUN: 13 mg/dL (ref 8–23)
CO2: 26 mmol/L (ref 22–32)
Calcium: 8.9 mg/dL (ref 8.9–10.3)
Chloride: 107 mmol/L (ref 98–111)
Creatinine, Ser: 1.08 mg/dL (ref 0.61–1.24)
GFR, Estimated: >60 mL/min (ref >60)
Glucose, Bld: 94 mg/dL (ref 70–99)
Potassium: 3.6 mmol/L (ref 3.5–5.1)
Sodium: 140 mmol/L (ref 135–145)
Total Bilirubin: 0.4 mg/dL (ref 0.3–1.2)
Total Protein: 6.6 g/dL (ref 6.5–8.1)

## 2021-10-12 MED ORDER — HEPARIN SOD (PORK) LOCK FLUSH 100 UNIT/ML IV SOLN
500.0000 [IU] | Freq: Once | INTRAVENOUS | Status: AC | PRN
  Administered 2021-10-12: 500 [IU]
  Filled 2021-10-12: qty 5

## 2021-10-12 MED ORDER — TAFASITAMAB-CXIX CHEMO INJECTION 200 MG
12.0000 mg/kg | Freq: Once | INTRAVENOUS | Status: AC
  Administered 2021-10-12: 1000 mg via INTRAVENOUS
  Filled 2021-10-12: qty 25

## 2021-10-12 MED ORDER — PROCHLORPERAZINE MALEATE 10 MG PO TABS
10.0000 mg | ORAL_TABLET | Freq: Once | ORAL | Status: AC
  Administered 2021-10-12: 10 mg via ORAL
  Filled 2021-10-12: qty 1

## 2021-10-12 MED ORDER — SODIUM CHLORIDE 0.9 % IV SOLN
Freq: Once | INTRAVENOUS | Status: AC
  Filled 2021-10-12: qty 250

## 2021-10-12 MED ORDER — SODIUM CHLORIDE 0.9% FLUSH
10.0000 mL | Freq: Once | INTRAVENOUS | Status: AC
  Administered 2021-10-12: 10 mL via INTRAVENOUS
  Filled 2021-10-12: qty 10

## 2021-10-12 NOTE — Progress Notes (Signed)
Clears his throat at times usually around taking the revlimid

## 2021-10-12 NOTE — Progress Notes (Signed)
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  This was followed by a PET CT scan which showed extensive FDG avid adenopathy in the neck chest abdomen and pelvis.  FDG avid splenic lesions.  Solitary intramuscular FDG avid lesion in the right biceps femoris muscle.   Supraclavicular excisional lymph node biopsy showed high-grade B-cell lymphoma germinal center type Ki-67 greater than 95%.  FISH testing was positive for Bcl-2 BCL6 and MYC consistent with triple hit lymphoma.  By NCCN IPI score would be 4 based on age and elevated LDH and stage IV (intramuscular biceps femoris lesion) which puts him in the high intermediate risk group. 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   Patient received RCHOP for cycle 1 with plans for [facility] with cycle 2. IT MTX for CNS prophylaxis and he has received 3 cycles but declined further cycles.  MRI brain negative for lymphoma.  Chemotherapy was not being able to escalated upwards due to neutropenia which has persisted to 3 weeks and he has been receiving treatment every  4 weeks   PET CT scan after 3 cycles of chemotherapy showedComplete or near complete metabolic response.  Residual disease in the anterior upper right mediastinum slightly greater than background mediastinal activity   Patient found to have a clinically palpable right supraclavicular lymph node which was subsequently biopsied and was consistent with previously diagnosed lymphoma.  PET CT scan showedNew right level 5 adenopathy in the lower neck with dominant lymph node 1.6 cm [place] 5.  No other findings of acute active lymphoma in chest abdomen pelvis and bones.  MRI brain negative for leptomeningeal disease.   Patient seen for second opinion by Dr. Lonia Blood at [facility] for consideration of CAR-T cell therapy.  Patient did not wish to proceed with CAR-T cell treatment after reviewing risks versus benefits.  Moreover patient's health insurance will also not cover CAR-T cell therapy. Patient started on second line tafasitamab/Revlimid regimen in June 2023.  He is on Revlimid 15 mg 3 weeks on and 1 week off  Interval history-tolerating treatment well without any significant side effects.  Does not feel that his right supraclavicular lymphadenopathy has decreased significantly.  Appetite and weight have remained stable.  ECOG PS- 0 Pain scale- 0   Review of systems- Review of Systems  Constitutional:  Negative for chills, fever, malaise/fatigue and weight loss.  HENT:  Negative for congestion, ear discharge and nosebleeds.   Eyes:  Negative for blurred vision.  Respiratory:  Negative for cough, hemoptysis, sputum production, shortness of breath and wheezing.   Cardiovascular:  Negative for chest pain, palpitations, orthopnea and claudication.  Gastrointestinal:  Negative for abdominal pain, blood in stool, constipation, diarrhea, heartburn,  melena, nausea and vomiting.  Genitourinary:  Negative for dysuria, flank pain, frequency, hematuria and urgency.  Musculoskeletal:  Negative for back pain, joint  pain and myalgias.  Skin:  Negative for rash.  Neurological:  Negative for dizziness, tingling, focal weakness, seizures, weakness and headaches.  Endo/Heme/Allergies:  Does not bruise/bleed easily.  Psychiatric/Behavioral:  Negative for depression and suicidal ideas. The patient does not have insomnia.       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     Past Surgical History:  Procedure Laterality Date   BONE MARROW BIOPSY     COLONOSCOPY     EXCISION MASS NECK Right 06/12/2020   Procedure: EXCISION MASS NECK;  Surgeon: Jules Husbands, MD;  Location: [facility] ORS;  Service: General;  Laterality: Right;   HERNIA REPAIR Right    at age 35-RIH   71 [place] Right 06/24/2020   Procedure: INSERTION PORT-A-CATH;  Surgeon: Jules Husbands, MD;  Location: [facility] ORS;  Service: General;  Laterality: Right;    Social History   Socioeconomic History   Marital status: Single    Spouse name: Not on file   Number of children: Not on file   Years of education: Not on file   Highest education level: Not on file  Occupational History   Not on file  Tobacco Use   Smoking status: Former    Types: Cigarettes    Quit date: 06/13/2020    Years since quitting: 1.3   Smokeless tobacco: Never  Vaping Use   Vaping Use: Never used  Substance and Sexual Activity   Alcohol use: Not Currently   Drug use: Not Currently    Types: Marijuana   Sexual activity: Not Currently  Other Topics Concern   Not on file  Social History Narrative   Has daughter and grandson in the home   Social Determinants of Health   Financial Resource Strain: Not on file  Food Insecurity: Not on file  Transportation Needs: Not on file  Physical Activity: Not on file  Stress: Not on file  Social Connections: Not on file  Intimate Partner Violence: Not on  file    Family History  Problem Relation Age of Onset   Anemia Mother    Hypertension Mother    Goiter Mother    Cancer Father    Cancer Sister    Multiple sclerosis Sister      Current Outpatient Medications:    allopurinol (ZYLOPRIM) 300 MG tablet, Take 1 tablet (300 mg total) by mouth daily., Disp: 30 tablet, Rfl: 1   aspirin EC 81 MG tablet, Take 1 tablet (81 mg total) by mouth daily. Swallow whole., Disp: 30 tablet, Rfl: 0   atenolol (TENORMIN) 25 MG tablet, Take 25 mg by mouth daily., Disp: , Rfl:    cholecalciferol (VITAMIN D3) 25 MCG (1000 UNIT) tablet, Take 1,000 Units by mouth daily., Disp: , Rfl:    lenalidomide (REVLIMID) 15 MG capsule, Take 1 capsule (15 mg total) by mouth daily. Take for 21 days, then hold for 7 days. Repeat every 28 days., Disp: 21 capsule, Rfl: 0   lisinopril (ZESTRIL) 10 MG tablet, Take 10 mg by mouth daily., Disp: , Rfl:    Omega-3 Fatty Acids (FISH OIL ADULT GUMMIES PO), Take by mouth., Disp: ,  Rfl:    vitamin C (ASCORBIC ACID) 500 MG tablet, Take 500 mg by mouth daily., Disp: , Rfl:    ondansetron (ZOFRAN) 8 MG tablet, Take 1 tablet (8 mg total) by mouth 2 (two) times daily as needed for nausea (Nausea or vomiting). (Patient not taking: Reported on 09/28/2021), Disp: 30 tablet, Rfl: 1   prochlorperazine (COMPAZINE) 10 MG tablet, Take 1 tablet (10 mg total) by mouth every 6 (six) hours as needed (Nausea or vomiting). (Patient not taking: Reported on 08/28/2021), Disp: 30 tablet, Rfl: 1 No current facility-administered medications for this visit.  Facility-Administered Medications Ordered in Other Visits:    0.9 %  sodium chloride infusion, , Intravenous, Continuous, Sindy Guadeloupe, MD, Stopped at 09/09/20 1507   0.9 %  sodium chloride infusion, , Intravenous, Continuous, Cammie Sickle, MD, Stopped at 09/11/20 1502   0.9 %  sodium chloride infusion, , Intravenous, Continuous, Sindy Guadeloupe, MD, Stopped at 11/06/20 1424   heparin lock flush 100  unit/mL, 500 Units, Intravenous, Once, Sindy Guadeloupe, MD   heparin lock flush 100 unit/mL, 500 Units, Intracatheter, Once PRN, Sindy Guadeloupe, MD   methotrexate (PF) 12 mg in sodium chloride (PF) 0.9 % INTRATHECAL chemo injection, , Intrathecal, Once, Sindy Guadeloupe, MD  Physical exam:  Vitals:   10/12/21 0905  BP: (!) 149/89  Pulse: (!) 49  Resp: 16  Temp: (!) 96.9 F (36.1 C)  TempSrc: Oral  Weight: 186 lb 4.8 oz (84.5 kg)  Height: 6' 1" (1.854 m)   Physical Exam Constitutional:      General: He is not in acute distress. Cardiovascular:     Rate and Rhythm: Normal rate and regular rhythm.     Heart sounds: Normal heart sounds.  Pulmonary:     Effort: Pulmonary effort is normal.     Breath sounds: Normal breath sounds.  Abdominal:     General: Bowel sounds are normal.     Palpations: Abdomen is soft.  Lymphadenopathy:     Comments: Palpable right supraclavicular lymphadenopathy not significantly changed  Skin:    General: Skin is warm and dry.  Neurological:     Mental Status: He is alert and oriented to person, place, and time.         Latest Ref Rng & Units 10/12/2021    8:16 AM  CMP  Glucose 70 - 99 mg/dL 94   BUN 8 - 23 mg/dL 13   Creatinine 0.61 - 1.24 mg/dL 1.08   Sodium 135 - 145 mmol/L 140   Potassium 3.5 - 5.1 mmol/L 3.6   Chloride 98 - 111 mmol/L 107   CO2 22 - 32 mmol/L 26   Calcium 8.9 - 10.3 mg/dL 8.9   Total Protein 6.5 - 8.1 g/dL 6.6   Total Bilirubin 0.3 - 1.2 mg/dL 0.4   Alkaline Phos 38 - 126 U/L 83   AST 15 - 41 U/L 38   ALT 0 - 44 U/L 29       Latest Ref Rng & Units 10/12/2021    8:16 AM  CBC  WBC 4.0 - 10.5 K/uL 4.8   Hemoglobin 13.0 - 17.0 g/dL 13.2   Hematocrit 39.0 - 52.0 % 38.0   Platelets 150 - 400 K/uL 248      Assessment and plan- Patient is a 62 y.o. male  With history of stage IV diffuse large B-cell lymphoma triple hit s/p 6 cycles of R-EPOCH chemotherapy now with relapsed/refractory disease within 12 months  of initial  treatment.  He is here for on treatment assessment prior to cycle 2-day 22 of tafasitamab chemotherapy combined with Revlimid   Counts okay to proceed with cycle 2-day 22 of tafasitamab today.  He has PET scan scheduled for later this week.  Clinically there has been no significant reduction in his right supraclavicular adenopathy.  If he has progressive disease on his PET scan I will switch him to polatuzumab Bendamustine and Rituxan regimen at that time.  If there is no progression elsewhere I will consider offering him palliative radiation to his right supraclavicular adenopathy while continuing to facet and.  I will call him with the results of the PET scan  He will otherwise directly proceed for cycle 3-day 1 of tafasitamab in 1 week and see covering MD in 2 weeks for cycle 3-day 8 of treatment and I will see him back in 4 weeks for cycle 3-day 22 of treatment  Mild chemo-induced neutropenia: ANC remains more than 1.5 continue to monitor.  He will also continue to take Revlimid15 mg 3 weeks on 1 week off which she is tolerating well without any significant side effects   Visit Diagnosis 1. High grade B-cell lymphoma ([place])   2. Encounter for antineoplastic chemotherapy      Dr. Randa Evens, MD, MPH [facility] at [facility] [phone] 10/12/2021 12:56 PM

## 2021-10-13 ENCOUNTER — Other Ambulatory Visit: Payer: Self-pay

## 2021-10-13 ENCOUNTER — Encounter
Admission: RE | Admit: 2021-10-13 | Discharge: 2021-10-13 | Disposition: A | Discharge status: Home / Self Care | Payer: Medicaid Other | Source: Ambulatory Visit | Attending: Oncology | Admitting: Oncology

## 2021-10-13 DIAGNOSIS — C8510 Unspecified B-cell lymphoma, unspecified site: Secondary | ICD-10-CM | POA: Insufficient documentation

## 2021-10-13 DIAGNOSIS — Z79899 Other long term (current) drug therapy: Secondary | ICD-10-CM | POA: Insufficient documentation

## 2021-10-14 ENCOUNTER — Ambulatory Visit
Admission: RE | Admit: 2021-10-14 | Discharge: 2021-10-14 | Disposition: A | Discharge status: Home / Self Care | Payer: Medicaid Other | Source: Ambulatory Visit | Attending: Oncology | Admitting: Oncology

## 2021-10-14 DIAGNOSIS — Z79899 Other long term (current) drug therapy: Secondary | ICD-10-CM | POA: Insufficient documentation

## 2021-10-14 DIAGNOSIS — C8510 Unspecified B-cell lymphoma, unspecified site: Secondary | ICD-10-CM | POA: Insufficient documentation

## 2021-10-14 LAB — GLUCOSE, CAPILLARY: Glucose-Capillary: 99 mg/dL (ref 70–99)

## 2021-10-14 MED ORDER — FLUDEOXYGLUCOSE F - 18 (FDG) INJECTION
9.6000 | Freq: Once | INTRAVENOUS | Status: AC | PRN
  Administered 2021-10-14: 10.12 via INTRAVENOUS

## 2021-10-15 ENCOUNTER — Encounter: Payer: Self-pay | Admitting: Oncology

## 2021-10-15 ENCOUNTER — Inpatient Hospital Stay: Payer: Self-pay | Attending: Oncology | Admitting: Oncology

## 2021-10-15 DIAGNOSIS — Z5112 Encounter for antineoplastic immunotherapy: Secondary | ICD-10-CM | POA: Insufficient documentation

## 2021-10-15 DIAGNOSIS — Z8349 Family history of other endocrine, nutritional and metabolic diseases: Secondary | ICD-10-CM | POA: Insufficient documentation

## 2021-10-15 DIAGNOSIS — Z832 Family history of diseases of the blood and blood-forming organs and certain disorders involving the immune mechanism: Secondary | ICD-10-CM | POA: Insufficient documentation

## 2021-10-15 DIAGNOSIS — Z7961 Long term (current) use of immunomodulator: Secondary | ICD-10-CM | POA: Insufficient documentation

## 2021-10-15 DIAGNOSIS — R7402 Elevation of levels of lactic acid dehydrogenase (LDH): Secondary | ICD-10-CM | POA: Insufficient documentation

## 2021-10-15 DIAGNOSIS — Z79899 Other long term (current) drug therapy: Secondary | ICD-10-CM | POA: Insufficient documentation

## 2021-10-15 DIAGNOSIS — Z79624 Long term (current) use of inhibitors of nucleotide synthesis: Secondary | ICD-10-CM | POA: Insufficient documentation

## 2021-10-15 DIAGNOSIS — C8331 Diffuse large B-cell lymphoma, lymph nodes of head, face, and neck: Secondary | ICD-10-CM | POA: Insufficient documentation

## 2021-10-15 DIAGNOSIS — C8510 Unspecified B-cell lymphoma, unspecified site: Secondary | ICD-10-CM

## 2021-10-15 DIAGNOSIS — Z809 Family history of malignant neoplasm, unspecified: Secondary | ICD-10-CM | POA: Insufficient documentation

## 2021-10-15 DIAGNOSIS — Z5111 Encounter for antineoplastic chemotherapy: Secondary | ICD-10-CM | POA: Insufficient documentation

## 2021-10-15 DIAGNOSIS — Z8269 Family history of other diseases of the musculoskeletal system and connective tissue: Secondary | ICD-10-CM | POA: Insufficient documentation

## 2021-10-15 DIAGNOSIS — Z7189 Other specified counseling: Secondary | ICD-10-CM

## 2021-10-15 DIAGNOSIS — Z87891 Personal history of nicotine dependence: Secondary | ICD-10-CM | POA: Insufficient documentation

## 2021-10-15 DIAGNOSIS — Z8249 Family history of ischemic heart disease and other diseases of the circulatory system: Secondary | ICD-10-CM | POA: Insufficient documentation

## 2021-10-15 DIAGNOSIS — I700 Atherosclerosis of aorta: Secondary | ICD-10-CM | POA: Insufficient documentation

## 2021-10-15 MED ORDER — SULFAMETHOXAZOLE-TRIMETHOPRIM 800-160 MG PO TABS: 1.0000 | ORAL_TABLET | ORAL | 5 refills | Status: DC

## 2021-10-15 NOTE — Progress Notes (Signed)
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  This was followed by a PET CT scan which showed extensive FDG avid adenopathy in the neck chest abdomen and pelvis.  FDG avid splenic lesions.  Solitary intramuscular FDG avid lesion in the right biceps femoris muscle.   Supraclavicular excisional lymph node biopsy showed high-grade B-cell lymphoma germinal center type Ki-67 greater than 95%.  FISH testing was positive for Bcl-2 BCL6 and MYC consistent with triple hit lymphoma.  By NCCN IPI score would be 4 based on age and elevated LDH and stage IV (intramuscular biceps femoris lesion) which puts him in the high intermediate risk group. 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   Patient received RCHOP for cycle 1 with plans for DA [facility] with cycle 2. IT MTX for CNS prophylaxis and he has received 3 cycles but declined further cycles.  MRI brain negative for lymphoma.  Chemotherapy was not being able to escalated upwards due to neutropenia which has persisted to 3 weeks and he has been receiving treatment every 4 weeks    PET CT scan after 3 cycles of chemotherapy showedComplete or near complete metabolic response.  Residual disease in the anterior upper right mediastinum slightly greater than background mediastinal activity   Patient found to have a clinically palpable right supraclavicular lymph node which was subsequently biopsied and was consistent with previously diagnosed lymphoma.  PET CT scan showedNew right level 5 adenopathy in the lower neck with dominant lymph node 1.6 cm Deauville 5.  No other findings of acute active lymphoma in chest abdomen pelvis and bones.  MRI brain negative for leptomeningeal disease.   Patient seen for second opinion by Dr. Lonia Blood at [facility] for consideration of CAR-T cell therapy.  Patient did not wish to proceed with CAR-T cell treatment after reviewing risks versus benefits.  Moreover patient's health insurance will also not cover CAR-T cell therapy. Patient started on second line tafasitamab/Revlimid regimen in June 2023.  He is on Revlimid 15 mg 3 weeks on and 1 week off  Interval history-no acute issues since last visit and he is doing well overall  ECOG PS- 0 Pain scale- 0   Review of systems- Review of Systems  Constitutional:  Negative for chills, fever, malaise/fatigue and weight loss.  HENT:  Negative for congestion, ear discharge and nosebleeds.   Eyes:  Negative for blurred vision.  Respiratory:  Negative for cough, hemoptysis, sputum production, shortness of breath and wheezing.   Cardiovascular:  Negative for chest pain, palpitations, orthopnea and claudication.  Gastrointestinal:  Negative for abdominal pain, blood in stool, constipation, diarrhea, heartburn, melena, nausea and vomiting.  Genitourinary:  Negative for dysuria, flank pain, frequency, hematuria and urgency.  Musculoskeletal:  Negative for back pain, joint pain and myalgias.  Skin:  Negative for rash.  Neurological:  Negative for dizziness, tingling, focal weakness, seizures, weakness and  headaches.  Endo/Heme/Allergies:  Does not bruise/bleed easily.  Psychiatric/Behavioral:  Negative for depression and suicidal ideas. The patient does not have insomnia.       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     Past Surgical History:  Procedure Laterality Date   BONE MARROW BIOPSY     COLONOSCOPY     EXCISION MASS NECK Right 06/12/2020   Procedure: EXCISION MASS NECK;  Surgeon: Jules Husbands, MD;  Location: [facility] ORS;  Service: General;  Laterality: Right;   HERNIA REPAIR Right    at age 32-RIH   53 [place] Right 06/24/2020   Procedure: INSERTION PORT-A-CATH;  Surgeon: Jules Husbands, MD;  Location: [facility] ORS;  Service: General;  Laterality: Right;    Social History   Socioeconomic History   Marital status: Single    Spouse name: Not on file   Number of children: Not on file   Years of education: Not on file   Highest education level: Not on file  Occupational History   Not on file  Tobacco Use   Smoking status: Former    Types: Cigarettes    Quit date: 06/13/2020    Years since quitting: 1.3   Smokeless tobacco: Never  Vaping Use   Vaping Use: Never used  Substance and Sexual Activity   Alcohol use: Not Currently   Drug use: Not Currently    Types: Marijuana   Sexual activity: Not Currently  Other Topics Concern   Not on file  Social History Narrative   Has daughter and grandson in the home   Social Determinants of Health   Financial Resource Strain: Not on file  Food Insecurity: Not on file  Transportation Needs: Not on file  Physical Activity: Not on file  Stress: Not on file  Social Connections: Not on file  Intimate Partner Violence: Not on file    Family History  Problem Relation Age of Onset   Anemia Mother    Hypertension Mother    Goiter Mother    Cancer Father     Cancer Sister    Multiple sclerosis Sister      Current Outpatient Medications:    allopurinol (ZYLOPRIM) 300 MG tablet, Take 1 tablet (300 mg total) by mouth daily., Disp: 30 tablet, Rfl: 1   aspirin EC 81 MG tablet, Take 1 tablet (81 mg total) by mouth daily. Swallow whole., Disp: 30 tablet, Rfl: 0   atenolol (TENORMIN) 25 MG tablet, Take 25 mg by mouth daily., Disp: , Rfl:    cholecalciferol (VITAMIN D3) 25 MCG (1000 UNIT) tablet, Take 1,000 Units by mouth daily., Disp: , Rfl:    lenalidomide (REVLIMID) 15 MG capsule, Take 1 capsule (15 mg total) by mouth daily. Take for 21 days, then hold for 7 days. Repeat every 28 days., Disp: 21 capsule, Rfl: 0   lisinopril (ZESTRIL) 10 MG tablet, Take 10 mg by mouth daily., Disp: , Rfl:    Omega-3 Fatty Acids (FISH OIL ADULT GUMMIES PO), Take by mouth., Disp: , Rfl:    ondansetron (ZOFRAN) 8 MG tablet, Take 1 tablet (8 mg total) by mouth  2 (two) times daily as needed for nausea (Nausea or vomiting). (Patient not taking: Reported on 09/28/2021), Disp: 30 tablet, Rfl: 1   prochlorperazine (COMPAZINE) 10 MG tablet, Take 1 tablet (10 mg total) by mouth every 6 (six) hours as needed (Nausea or vomiting). (Patient not taking: Reported on 08/28/2021), Disp: 30 tablet, Rfl: 1   vitamin C (ASCORBIC ACID) 500 MG tablet, Take 500 mg by mouth daily., Disp: , Rfl:  No current facility-administered medications for this visit.  Facility-Administered Medications Ordered in Other Visits:    0.9 %  sodium chloride infusion, , Intravenous, Continuous, Sindy Guadeloupe, MD, Stopped at 09/09/20 1507   0.9 %  sodium chloride infusion, , Intravenous, Continuous, Rogue Bussing, Elisha Headland, MD, Stopped at 09/11/20 1502   0.9 %  sodium chloride infusion, , Intravenous, Continuous, Sindy Guadeloupe, MD, Stopped at 11/06/20 1424   heparin lock flush 100 unit/mL, 500 Units, Intravenous, Once, Sindy Guadeloupe, MD   methotrexate (PF) 12 mg in sodium chloride (PF) 0.9 % INTRATHECAL chemo  injection, , Intrathecal, Once, Sindy Guadeloupe, MD  Physical exam:  Physical Exam Constitutional:      General: He is not in acute distress. Cardiovascular:     Rate and Rhythm: Normal rate and regular rhythm.     Heart sounds: Normal heart sounds.  Pulmonary:     Effort: Pulmonary effort is normal.     Breath sounds: Normal breath sounds.  Lymphadenopathy:     Comments: Palpable right supraclavicular adenopathy  Skin:    General: Skin is warm and dry.  Neurological:     Mental Status: He is alert and oriented to person, place, and time.         Latest Ref Rng & Units 10/12/2021    8:16 AM  CMP  Glucose 70 - 99 mg/dL 94   BUN 8 - 23 mg/dL 13   Creatinine 0.61 - 1.24 mg/dL 1.08   Sodium 135 - 145 mmol/L 140   Potassium 3.5 - 5.1 mmol/L 3.6   Chloride 98 - 111 mmol/L 107   CO2 22 - 32 mmol/L 26   Calcium 8.9 - 10.3 mg/dL 8.9   Total Protein 6.5 - 8.1 g/dL 6.6   Total Bilirubin 0.3 - 1.2 mg/dL 0.4   Alkaline Phos 38 - 126 U/L 83   AST 15 - 41 U/L 38   ALT 0 - 44 U/L 29       Latest Ref Rng & Units 10/12/2021    8:16 AM  CBC  WBC 4.0 - 10.5 K/uL 4.8   Hemoglobin 13.0 - 17.0 g/dL 13.2   Hematocrit 39.0 - 52.0 % 38.0   Platelets 150 - 400 K/uL 248     No images are attached to the encounter.  NM PET Image Restag (PS) Skull Base To Thigh  Result Date: 10/15/2021 CLINICAL DATA:  Subsequent treatment strategy for B-cell lymphoma. EXAM: NUCLEAR MEDICINE PET SKULL BASE TO THIGH TECHNIQUE: 10.12 mCi F-18 FDG was injected intravenously. Full-ring PET imaging was performed from the skull base to thigh after the radiotracer. CT data was obtained and used for attenuation correction and anatomic localization. Fasting blood glucose: 99 mg/dl COMPARISON:  07/27/2021 FINDINGS: Mediastinal blood pool activity: SUV max 1.82 Liver activity: SUV max 2.60 NECK: -Within the right supraclavicular region there is a nodal mass which measures 4.0 x 3.0 cm with SUV max of 14.87, image 51/2. On the  previous exam this measured 1.6 x 1.8 cm with SUV max of  13.09. -Adjacent node/mass measures 2.6 by 1.9 cm with SUV max of 12.31, image 48/2. Previously 1.8 x 1.4 cm with SUV max of 12.11. -Within the right posterior supraclavicular region there are 2 enlarging lymph nodes which measure up to 1.2 cm with SUV max of 9.41, image 57/2. Previously these nodes measured up to 7 mm with SUV max of 1.79. Incidental CT findings: none CHEST: No tracer avid axillary, mediastinal, or hilar lymph nodes. There is no tracer avid pulmonary nodule or mass. Incidental CT findings: Aortic atherosclerosis. No pericardial effusion. ABDOMEN/PELVIS: No abnormal hypermetabolic activity within the liver, pancreas, adrenal glands, or spleen. No hypermetabolic lymph nodes in the abdomen or pelvis. Incidental CT findings: Aortic atherosclerosis. SKELETON: No focal hypermetabolic activity to suggest skeletal metastasis. Incidental CT findings: IMPRESSION: 1. Interval increase in size of FDG avid right lower neck/supraclavicular adenopathy/mass compatible with progression of disease. Similar degree of FDG uptake associated with the 2 dominant nodal masses. The 2 smaller progressive right posterior supraclavicular lymph nodes exhibit significant interval increase in FDG uptake. Imaging findings are compatible with progressive Deauville criteria 5 disease. Electronically Signed   By: Kerby Moors M.D.   On: 10/15/2021 05:35     Assessment and plan- Patient is a 61 y.o. male with relapsed triple hit diffuse large B-cell lymphoma here to discuss PET CT scan results and further management  Patient relapsed after first-line R-EPOCH chemotherapy and was switched to second line tafasitamab Revlimid combination.  He was sent to [facility] for second opinion for possible CAR-T cell therapy.  However patient is uninsured and also did not wish to proceed with CAR-T cell given the possible risk of side effects.  Patient received 8 weekly doses of  tafasitamab/2 cycles.  Repeat PET CT scan showsIncrease in the size of right supraclavicular lymph nodes with a Deauville score of 5.  No other areas of disease.  Unfortunately he has not responded to tafasitamab Revlimid combination.  I would like to switch him to third line treatment at this time.  I did discuss by specific T-cell engage her antibodies treatment (BiTE) specifically glofitamab which is administered every 21 days.  This will have to be done at [facility] and often the first few doses are given in patient with inpatient monitoring for the next 24 to 48 hours.  Potential side effects of this treatment include cytokine release syndromeAs well as neurological toxicity.  This can be given to him despite lack of insurance.  He likely has a better chance to respond to these treatments as compared to conventional treatments.  However patient does not wish to go to [facility].  He is concerned about the burden that can pose to his daughter who lives with him.  I have asked him to reconsider his decision and think about it.  Alternative to bi- specific therapy would be polatuzumab Bendamustine Rituxan chemotherapyWhich is given every 21 days for 6 cycles.  Bendamustine will be given on day 1 and day 2.  This regimen was studied in patients who are transplant ineligible and trial showed complete response rate of 40% versus 18% with BR alone.  PFS 10 months versus 4 months and overall survival 12 months versus 5 months.  However patient has triple hit DLBCL and therefore the likelihood of this kind of response is likely low.  Discussed risks and benefits of this chemotherapy including all but not limited to nausea vomiting diarrhea low blood counts, risk of infections and hospitalization.  Treatment will be given with a palliative  intent.  Plan is to cancel tafasitamab which is scheduled for next week and obtain pola-BR from the drug company given lack of insurance.  This will likely be done in about 10 days time and  he will see covering MD on that day.  Patient will also receive urine needs on day 3 or day 4 of treatment.  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  We have been unable to obtain tenofovir due to lack of insurance.  He will also need to stay on Bactrim, acyclovir and allopurinol prophylaxis.  I will see him for cycle 2 of Pola BR chemotherapy   Visit Diagnosis 1. Goals of care, counseling/discussion   2. High grade B-cell lymphoma ([place])      Dr. Randa Evens, MD, MPH [facility] at [facility] [phone] 10/15/2021 5:23 PM

## 2021-10-15 NOTE — Progress Notes (Signed)
DISCONTINUE OFF PATHWAY REGIMEN - Lymphoma and CLL   [id number]:Lenalidomide 25 mg PO + Tafasitamab 12 mg/kg IV (C1-12) Followed by Tafasitamab 12 mg/kg IV (C13+):   Cycle 1: A cycle is 28 days:     Lenalidomide      Tafasitamab-cxix    Cycles 2 and 3: A cycle is every 28 days:     Lenalidomide      Tafasitamab-cxix    Cycles 4 through 12: A cycle is every 28 days:     Lenalidomide      Tafasitamab-cxix    Cycles 13 and beyond: A cycle is every 28 days:     Tafasitamab-cxix   **Always confirm dose/schedule in your pharmacy ordering system**  REASON: Disease Progression PRIOR TREATMENT: Off Pathway: Lenalidomide 25 mg PO + Tafasitamab 12 mg/kg IV (C1-12) Followed by Tafasitamab 12 mg/kg IV (C13+) TREATMENT RESPONSE: Progressive Disease (PD)  START OFF PATHWAY REGIMEN - Lymphoma and CLL   [id number]:Polatuzumab 1.8 mg/kg IV D1 + Bendamustine 90 mg/m2 IV D1,2 + Rituximab 375 mg/m2 IV D1 q21 Days x 6 Cycles:   A cycle is every 21 days:     Rituximab-xxxx      Polatuzumab vedotin-piiq      Bendamustine   **Always confirm dose/schedule in your pharmacy ordering system**  Patient Characteristics: Double Hit Lymphoma, Second Line and Beyond Disease Type: Not Applicable Disease Type: Double Hit Lymphoma Disease Type: Not Applicable Line of therapy: Second Line and Beyond Intent of Therapy: Non-Curative / Palliative Intent, Discussed with Patient

## 2021-10-16 ENCOUNTER — Other Ambulatory Visit (HOSPITAL_COMMUNITY): Payer: Self-pay

## 2021-10-16 ENCOUNTER — Telehealth: Payer: Self-pay | Admitting: Pharmacist

## 2021-10-16 ENCOUNTER — Other Ambulatory Visit: Payer: Self-pay | Admitting: *Deleted

## 2021-10-16 DIAGNOSIS — C8331 Diffuse large B-cell lymphoma, lymph nodes of head, face, and neck: Secondary | ICD-10-CM

## 2021-10-16 DIAGNOSIS — R768 Other specified abnormal immunological findings in serum: Secondary | ICD-10-CM

## 2021-10-16 MED ORDER — VEMLIDY 25 MG PO TABS: 25.0000 mg | ORAL_TABLET | Freq: Every day | ORAL | 2 refills | Status: DC

## 2021-10-16 MED ORDER — LENALIDOMIDE 15 MG PO CAPS: 15.0000 mg | ORAL_CAPSULE | Freq: Every day | ORAL | 0 refills | Status: DC

## 2021-10-16 NOTE — Telephone Encounter (Signed)
Pt was seen yest at 5 pm and the treatment is not working with this pt's cancer and the revlimid has been cancelled and Alyson from oral pharmacy called and cancelled it

## 2021-10-16 NOTE — Telephone Encounter (Addendum)
Oral Chemotherapy Pharmacist Encounter   Dr. Janese Banks would like patient to get started on Vemlidy (tenofovir alafenamide) for Hepatitis B virus reactivation prophylaxis.   Patient is uninsured and will need to proceed with manufacturer assistance application. He will stop but the cancer center on 8/7 or 8/8 to sign paperwork and bring in tax forms.   Darl Pikes, PharmD, BCPS, BCOP, CPP Hematology/Oncology Clinical Pharmacist [place]/DB/AP Oral [facility] [phone]  10/16/2021 11:58 AM

## 2021-10-19 ENCOUNTER — Inpatient Hospital Stay: Payer: Self-pay

## 2021-10-20 NOTE — Telephone Encounter (Signed)
Oral Chemotherapy Pharmacist Encounter   Patient stopped by to sign assistance paperwork. Completed paperwork was faxed to [employer] the assistance program for [place].   Will continue to f/u on assistance approval.   Darl Pikes, PharmD, BCPS, BCOP, CPP Hematology/Oncology Clinical Pharmacist /DB/AP Oral [facility] [phone]  10/20/2021 9:50 AM

## 2021-10-21 NOTE — Progress Notes (Signed)
..  Patient is receiving Replacement Medication. Medication: Polivy Manufacture: [employer] Approval Dates: Approved from 10/21/2021 until indefinitely. ID: [id number] Reason: Self Pay First DOS: 10/29/2021  .Marland KitchenPatient is receiving Replacement Medication. Medication: Udenyca Manufacture: [employer] Approval Dates: Approved from 10/21/2021 until 10/21/2022. Reason: Self Pay First DOS: 11/01/2021

## 2021-10-22 ENCOUNTER — Telehealth: Payer: Self-pay | Admitting: *Deleted

## 2021-10-22 NOTE — Telephone Encounter (Signed)
I called the pt and let him know that 3 of his drugs were approved ,, but 1 was not approved . So he will have a bill from the 1 chemo that we can't get approval. He will start wed next week and then Thursday will be a long day but then next treatment if it goes well then we can make it shorted but it will be a long treatment on day 2 each time. Then the 3rd day you will just get bendamustine, then following mon he will get his inj for inc. [facility]. Pt saw it on the my chart and he will be there

## 2021-10-23 NOTE — Progress Notes (Signed)
..  Patient is receiving Replacement Medication. Medication: Rituxan Manufacture: [employer] Approval Dates: Approved from 10/23/2021 until indefinitely. ID: [id number] Reason: self pay First DOS: 10/29/2021

## 2021-10-26 ENCOUNTER — Inpatient Hospital Stay: Payer: Self-pay | Admitting: Oncology

## 2021-10-26 ENCOUNTER — Inpatient Hospital Stay: Payer: Self-pay

## 2021-10-26 ENCOUNTER — Ambulatory Visit: Payer: Medicaid Other | Admitting: Nurse Practitioner

## 2021-10-27 ENCOUNTER — Other Ambulatory Visit (HOSPITAL_COMMUNITY): Payer: Self-pay

## 2021-10-27 ENCOUNTER — Encounter: Payer: Self-pay | Admitting: Oncology

## 2021-10-27 MED ORDER — VEMLIDY 25 MG PO TABS
25.0000 mg | ORAL_TABLET | Freq: Every day | ORAL | 2 refills | Status: DC
  Filled 2021-10-27 (×2): qty 30, 30d supply, fill #0
  Filled 2021-11-17: qty 30, 30d supply, fill #1
  Filled 2022-01-12: qty 30, 30d supply, fill #2

## 2021-10-27 NOTE — Telephone Encounter (Signed)
Oral Chemotherapy Pharmacist Encounter   Set patient up with delivery of Vemlidy from [place] using his [place] assistance approval. They will deliver medication to Mr. Pautz by 10/29/21. He knows to get started when he has medication in hand. Reviewed dosing/administration with patient.   Darl Pikes, PharmD, BCPS, BCOP, CPP Hematology/Oncology Clinical Pharmacist [place]/DB/AP Oral [facility] [phone]  10/27/2021 12:48 PM

## 2021-10-27 NOTE — Addendum Note (Signed)
Addended by: Darl Pikes on: 10/27/2021 01:33 PM   Modules accepted: Orders

## 2021-10-27 NOTE — Telephone Encounter (Signed)
Oral Chemotherapy Pharmacist Encounter   Called [employer] to check on status of patient's application. He was approved from 10/20/21 to 03/14/22.  Pharmacy Billing Information: ID: [id number] BIN: [id number] Group: [id number] PCN: [id number]   Darl Pikes, PharmD, BCPS, BCOP, CPP Hematology/Oncology Clinical Pharmacist [place]/DB/AP Oral [facility] [phone]  10/27/2021 10:48 AM

## 2021-10-28 ENCOUNTER — Inpatient Hospital Stay: Payer: Self-pay

## 2021-10-28 ENCOUNTER — Inpatient Hospital Stay (HOSPITAL_BASED_OUTPATIENT_CLINIC_OR_DEPARTMENT_OTHER): Payer: Self-pay | Admitting: Oncology

## 2021-10-28 ENCOUNTER — Encounter: Payer: Self-pay | Admitting: Oncology

## 2021-10-28 ENCOUNTER — Other Ambulatory Visit: Payer: Self-pay | Admitting: Oncology

## 2021-10-28 VITALS — BP 151/92 | HR 55 | Temp 97.4°F | Ht 73.0 in | Wt 180.0 lb

## 2021-10-28 DIAGNOSIS — C8510 Unspecified B-cell lymphoma, unspecified site: Secondary | ICD-10-CM

## 2021-10-28 DIAGNOSIS — Z5111 Encounter for antineoplastic chemotherapy: Secondary | ICD-10-CM

## 2021-10-28 DIAGNOSIS — R768 Other specified abnormal immunological findings in serum: Secondary | ICD-10-CM

## 2021-10-28 LAB — COMPREHENSIVE METABOLIC PANEL
ALT: 28 U/L (ref 0–44)
AST: 31 U/L (ref 15–41)
Albumin: 4 g/dL (ref 3.5–5.0)
Alkaline Phosphatase: 89 U/L (ref 38–126)
Anion gap: 5 (ref 5–15)
BUN: 13 mg/dL (ref 8–23)
CO2: 27 mmol/L (ref 22–32)
Calcium: 8.9 mg/dL (ref 8.9–10.3)
Chloride: 107 mmol/L (ref 98–111)
Creatinine, Ser: 0.84 mg/dL (ref 0.61–1.24)
GFR, Estimated: >60 mL/min (ref >60)
Glucose, Bld: 88 mg/dL (ref 70–99)
Potassium: 3.7 mmol/L (ref 3.5–5.1)
Sodium: 139 mmol/L (ref 135–145)
Total Bilirubin: 0.7 mg/dL (ref 0.3–1.2)
Total Protein: 6.7 g/dL (ref 6.5–8.1)

## 2021-10-28 LAB — CBC WITH DIFFERENTIAL/PLATELET
Abs Immature Granulocytes: 0 10*3/uL (ref 0.00–0.07)
Basophils Absolute: 0 10*3/uL (ref 0.0–0.1)
Basophils Relative: 1 %
Eosinophils Absolute: 0.1 10*3/uL (ref 0.0–0.5)
Eosinophils Relative: 3 %
HCT: 37.7 % — ABNORMAL LOW (ref 39.0–52.0)
Hemoglobin: 13.3 g/dL (ref 13.0–17.0)
Immature Granulocytes: 0 %
Lymphocytes Relative: 53 %
Lymphs Abs: 1.8 10*3/uL (ref 0.7–4.0)
MCH: 32 pg (ref 26.0–34.0)
MCHC: 35.3 g/dL (ref 30.0–36.0)
MCV: 90.8 fL (ref 80.0–100.0)
Monocytes Absolute: 0.4 10*3/uL (ref 0.1–1.0)
Monocytes Relative: 12 %
Neutro Abs: 1 10*3/uL — ABNORMAL LOW (ref 1.7–7.7)
Neutrophils Relative %: 31 %
Platelets: 239 10*3/uL (ref 150–400)
RBC: 4.15 MIL/uL — ABNORMAL LOW (ref 4.22–5.81)
RDW: 14.2 % (ref 11.5–15.5)
WBC: 3.3 10*3/uL — ABNORMAL LOW (ref 4.0–10.5)
nRBC: 0 % (ref 0.0–0.2)

## 2021-10-28 LAB — LACTATE DEHYDROGENASE: LDH: 193 U/L — ABNORMAL HIGH (ref 98–192)

## 2021-10-28 MED ORDER — SODIUM CHLORIDE 0.9% FLUSH
10.0000 mL | Freq: Once | INTRAVENOUS | Status: DC
  Filled 2021-10-28: qty 10

## 2021-10-28 MED ORDER — HEPARIN SOD (PORK) LOCK FLUSH 100 UNIT/ML IV SOLN
500.0000 [IU] | Freq: Once | INTRAVENOUS | Status: DC
  Filled 2021-10-28: qty 5

## 2021-10-28 MED FILL — Dexamethasone Sodium Phosphate Inj 100 MG/10ML: INTRAMUSCULAR | Qty: 1 | Status: AC

## 2021-10-28 NOTE — Progress Notes (Addendum)
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 Heme/Onc history:  Patient is a 61 year old male who underwent CT chest for symptoms of exertional shortness of breath which showed a right paratracheal mass 6.4 x 4.7 cm along with lymphadenopathy in the upper abdomen concerning for Lymphoma.  This was followed by a PET CT scan which showed extensive FDG avid adenopathy in the neck chest abdomen and pelvis.  FDG avid splenic lesions.  Solitary intramuscular FDG avid lesion in the right biceps femoris muscle.   Supraclavicular excisional lymph node biopsy showed high-grade B-cell lymphoma germinal center type Ki-67 greater than 95%.  FISH testing was positive for Bcl-2 BCL6 and MYC consistent with triple hit lymphoma.  By NCCN IPI score would be 4 based on age and elevated LDH and stage IV (intramuscular biceps femoris lesion) which puts him in the high intermediate risk group. 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   Patient received RCHOP for cycle 1 with plans for [facility] with cycle 2. IT MTX for CNS prophylaxis and he has received 3 cycles but declined further cycles.  MRI brain negative for lymphoma.  Chemotherapy was not being able to escalated upwards due to neutropenia which has persisted to 3 weeks and he has been receiving treatment every 4 weeks   PET CT scan after 3 cycles of chemotherapy  showedComplete or near complete metabolic response.  Residual disease in the anterior upper right mediastinum slightly greater than background mediastinal activity   Patient found to have a clinically palpable right supraclavicular lymph node which was subsequently biopsied and was consistent with previously diagnosed lymphoma.  PET CT scan showedNew right level 5 adenopathy in the lower neck with dominant lymph node 1.6 cm Deauville 5.  No other findings of acute active lymphoma in chest abdomen pelvis and bones.  MRI brain negative for leptomeningeal disease.   Patient seen for second opinion by Dr. Lonia Blood at [facility] for consideration of CAR-T cell therapy.  Patient did not wish to proceed with CAR-T cell treatment after reviewing risks versus benefits.  Moreover patient's health insurance will also not cover CAR-T cell therapy. Patient started on second line tafasitamab/Revlimid regimen in June 2023.  He is on Revlimid 15 mg 3 weeks on and 1 week off  Interval history-no acute issues since last visit and patient has no new complaints.  Patient follows up with Dr.Rao who is off today, I am covering her to see this patient.   ECOG PS- 0 Pain scale- 0   Review of systems- Review of Systems  Constitutional:  Negative for chills, fever, malaise/fatigue and weight loss.  HENT:  Negative for congestion, ear discharge and nosebleeds.   Eyes:  Negative for blurred vision.  Respiratory:  Negative for cough, hemoptysis, sputum production, shortness of breath and wheezing.   Cardiovascular:  Negative for chest pain, palpitations, orthopnea and claudication.  Gastrointestinal:  Negative for abdominal pain, blood in stool, constipation, diarrhea, heartburn, melena, nausea and vomiting.  Genitourinary:  Negative for dysuria, flank pain, frequency, hematuria and urgency.  Musculoskeletal:  Negative for back pain, joint pain and myalgias.  Skin:  Negative for rash.  Neurological:  Negative for dizziness,  tingling, focal weakness, seizures, weakness and headaches.  Endo/Heme/Allergies:  Does not bruise/bleed easily.  Psychiatric/Behavioral:  Negative for depression and suicidal ideas. The patient does not have insomnia.       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     Past Surgical History:  Procedure Laterality Date   BONE MARROW BIOPSY     COLONOSCOPY     EXCISION MASS NECK Right 06/12/2020   Procedure: EXCISION MASS NECK;  Surgeon: Jules Husbands, MD;  Location: [facility] ORS;  Service: General;  Laterality: Right;   HERNIA REPAIR Right    at age 23-RIH   33 [place] Right 06/24/2020   Procedure: INSERTION PORT-A-CATH;  Surgeon: Jules Husbands, MD;  Location: [facility] ORS;  Service: General;  Laterality: Right;    Social History   Socioeconomic History   Marital status: Single    Spouse name: Not on file   Number of children: Not on file   Years of education: Not on file   Highest education level: Not on file  Occupational History   Not on file  Tobacco Use   Smoking status: Former    Types: Cigarettes    Quit date: 06/13/2020    Years since quitting: 1.3   Smokeless tobacco: Never  Vaping Use   Vaping Use: Never used  Substance and Sexual Activity   Alcohol use: Not Currently   Drug use: Not Currently    Types: Marijuana   Sexual activity: Not Currently  Other Topics Concern   Not on file  Social History Narrative   Has daughter and grandson in the home   Social Determinants of Health   Financial Resource Strain: Not on file  Food Insecurity: Not on file  Transportation Needs: Not on file  Physical Activity: Not on file  Stress: Not on file  Social Connections: Not on file  Intimate Partner Violence: Not on file    Family History  Problem Relation Age of Onset   Anemia Mother     Hypertension Mother    Goiter Mother    Cancer Father    Cancer Sister    Multiple sclerosis Sister      Current Outpatient Medications:    aspirin EC 81 MG tablet, Take 1 tablet (81 mg total) by mouth daily. Swallow whole., Disp: 30 tablet, Rfl: 0   atenolol (TENORMIN) 25 MG tablet, Take 25 mg by mouth daily., Disp: , Rfl:    cholecalciferol (VITAMIN D3) 25 MCG (1000 UNIT) tablet, Take 1,000 Units by mouth daily., Disp: , Rfl:    lisinopril (ZESTRIL) 10 MG tablet, Take 10 mg by mouth daily., Disp: , Rfl:    Omega-3 Fatty Acids (FISH OIL ADULT GUMMIES PO), Take by mouth., Disp: , Rfl:    Tenofovir Alafenamide Fumarate (VEMLIDY) 25 MG TABS, Take 1 tablet (25 mg total) by mouth daily. Take with food., Disp: 30 tablet, Rfl: 2   vitamin C (ASCORBIC ACID) 500 MG tablet, Take 500 mg by mouth daily., Disp: , Rfl:    allopurinol (ZYLOPRIM) 300 MG tablet, TAKE 1 TABLET BY MOUTH EVERY DAY, Disp:  30 tablet, Rfl: 1   sulfamethoxazole-trimethoprim (BACTRIM DS) 800-160 MG tablet, Take 1 tablet by mouth 3 (three) times a week. (Patient not taking: Reported on 10/28/2021), Disp: 12 tablet, Rfl: 5 No current facility-administered medications for this visit.  Facility-Administered Medications Ordered in Other Visits:    0.9 %  sodium chloride infusion, , Intravenous, Continuous, Sindy Guadeloupe, MD, Stopped at 09/09/20 1507   0.9 %  sodium chloride infusion, , Intravenous, Continuous, Brahmanday, Govinda R, MD, Stopped at 09/11/20 1502   0.9 %  sodium chloride infusion, , Intravenous, Continuous, Sindy Guadeloupe, MD, Stopped at 11/06/20 1424   heparin lock flush 100 unit/mL, 500 Units, Intravenous, Once, Sindy Guadeloupe, MD   methotrexate (PF) 12 mg in sodium chloride (PF) 0.9 % INTRATHECAL chemo injection, , Intrathecal, Once, Sindy Guadeloupe, MD  Physical exam:  Today's Vitals   10/28/21 0847  BP: (!) 151/92  Pulse: (!) 55  Temp: (!) 97.4 F (36.3 C)  TempSrc: Tympanic  Weight: 180 lb (81.6 kg)   Height: '6\' 1"'  (1.854 m)  PainSc: 0-No pain   Body mass index is 23.75 kg/m.  Physical Exam Constitutional:      General: He is not in acute distress. Cardiovascular:     Rate and Rhythm: Normal rate and regular rhythm.     Heart sounds: Normal heart sounds.  Pulmonary:     Effort: Pulmonary effort is normal.     Breath sounds: Normal breath sounds.  Lymphadenopathy:     Comments: Palpable right supraclavicular adenopathy  Skin:    General: Skin is warm and dry.  Neurological:     Mental Status: He is alert and oriented to person, place, and time.     .labs    Latest Ref Rng & Units 10/28/2021    8:10 AM  CMP  Glucose 70 - 99 mg/dL 88   BUN 8 - 23 mg/dL 13   Creatinine 0.61 - 1.24 mg/dL 0.84   Sodium 135 - 145 mmol/L 139   Potassium 3.5 - 5.1 mmol/L 3.7   Chloride 98 - 111 mmol/L 107   CO2 22 - 32 mmol/L 27   Calcium 8.9 - 10.3 mg/dL 8.9   Total Protein 6.5 - 8.1 g/dL 6.7   Total Bilirubin 0.3 - 1.2 mg/dL 0.7   Alkaline Phos 38 - 126 U/L 89   AST 15 - 41 U/L 31   ALT 0 - 44 U/L 28       Latest Ref Rng & Units 10/28/2021    8:10 AM  CBC  WBC 4.0 - 10.5 K/uL 3.3   Hemoglobin 13.0 - 17.0 g/dL 13.3   Hematocrit 39.0 - 52.0 % 37.7   Platelets 150 - 400 K/uL 239     No images are attached to the encounter.  NM PET Image Restag (PS) Skull Base To Thigh  Result Date: 10/15/2021 CLINICAL DATA:  Subsequent treatment strategy for B-cell lymphoma. EXAM: NUCLEAR MEDICINE PET SKULL BASE TO THIGH TECHNIQUE: 10.12 mCi F-18 FDG was injected intravenously. Full-ring PET imaging was performed from the skull base to thigh after the radiotracer. CT data was obtained and used for attenuation correction and anatomic localization. Fasting blood glucose: 99 mg/dl COMPARISON:  07/27/2021 FINDINGS: Mediastinal blood pool activity: SUV max 1.82 Liver activity: SUV max 2.60 NECK: -Within the right supraclavicular region there is a nodal mass which measures 4.0 x 3.0 cm with SUV max of 14.87,  image 51/2. On the previous exam this measured 1.6 x  1.8 cm with SUV max of 13.09. -Adjacent node/mass measures 2.6 by 1.9 cm with SUV max of 12.31, image 48/2. Previously 1.8 x 1.4 cm with SUV max of 12.11. -Within the right posterior supraclavicular region there are 2 enlarging lymph nodes which measure up to 1.2 cm with SUV max of 9.41, image 57/2. Previously these nodes measured up to 7 mm with SUV max of 1.79. Incidental CT findings: none CHEST: No tracer avid axillary, mediastinal, or hilar lymph nodes. There is no tracer avid pulmonary nodule or mass. Incidental CT findings: Aortic atherosclerosis. No pericardial effusion. ABDOMEN/PELVIS: No abnormal hypermetabolic activity within the liver, pancreas, adrenal glands, or spleen. No hypermetabolic lymph nodes in the abdomen or pelvis. Incidental CT findings: Aortic atherosclerosis. SKELETON: No focal hypermetabolic activity to suggest skeletal metastasis. Incidental CT findings: IMPRESSION: 1. Interval increase in size of FDG avid right lower neck/supraclavicular adenopathy/mass compatible with progression of disease. Similar degree of FDG uptake associated with the 2 dominant nodal masses. The 2 smaller progressive right posterior supraclavicular lymph nodes exhibit significant interval increase in FDG uptake. Imaging findings are compatible with progressive Deauville criteria 5 disease. Electronically Signed   By: Kerby Moors M.D.   On: 10/15/2021 05:35     Assessment and plan- Patient is a 61 y.o. male with relapsed triple hit diffuse large B-cell lymphoma here for evaluation prior to chemotherapy.  #Triple hit diffuse large B-cell lymphoma Patient relapsed after first-line R-EPOCH chemotherapy and was switched to second line tafasitamab Revlimid combination.  He was sent to [facility] for second opinion for possible CAR-T cell therapy.  However patient is uninsured and also did not wish to proceed with CAR-T cell given the possible risk of side  effects.  Unfortunately his disease is refractory to Tafasitamab/Revlimid.  He had discussion with Dr. Janese Banks during his last visit regarding his options.  Decision was made to proceed with polatuzumab/Bendamustine/Rituxan Rationale and potential side effects were discussed by Dr. Janese Banks. Today patient has no additional questions regarding his upcoming treatments Reviewed and discussed with patient. Okay to proceed with cycle 1 polatuzumab/Bendamustine/Rituxan tomorrow.  #Positive hepatitis B core antibody.  Patient is to start Tenofovir once he gets the supply.  Confirmed with Dr. Elroy Channel RN Judeen Hammans that medication will be sent to him this week.    Recommend patient to take Bactrim, Aciclovir and allopurinol.  Follow-up 1 week lab NP evaluation of tolerability. Follow-up with Dr. Janese Banks in 3 weeks for next cycle of chemotherapy.   Visit Diagnosis 1. High grade B-cell lymphoma ([place])   2. Encounter for antineoplastic chemotherapy   3. Hepatitis B core antibody positive     Earlie Server, MD, PhD [facility] Hematology Oncology 10/28/2021

## 2021-10-29 ENCOUNTER — Inpatient Hospital Stay: Payer: Self-pay | Admitting: Oncology

## 2021-10-29 ENCOUNTER — Other Ambulatory Visit: Payer: Self-pay

## 2021-10-29 VITALS — BP 127/71 | HR 60 | Temp 96.9°F | Resp 18

## 2021-10-29 DIAGNOSIS — C8510 Unspecified B-cell lymphoma, unspecified site: Secondary | ICD-10-CM

## 2021-10-29 MED ORDER — SODIUM CHLORIDE 0.9% FLUSH
10.0000 mL | INTRAVENOUS | Status: DC | PRN
  Administered 2021-10-29: 10 mL
  Filled 2021-10-29: qty 10

## 2021-10-29 MED ORDER — SODIUM CHLORIDE 0.9 % IV SOLN
Freq: Once | INTRAVENOUS | Status: AC
  Filled 2021-10-29: qty 250

## 2021-10-29 MED ORDER — PALONOSETRON HCL INJECTION 0.25 MG/5ML
0.2500 mg | Freq: Once | INTRAVENOUS | Status: AC
  Administered 2021-10-29: 0.25 mg via INTRAVENOUS
  Filled 2021-10-29: qty 5

## 2021-10-29 MED ORDER — SODIUM CHLORIDE 0.9 % IV SOLN
375.0000 mg/m2 | Freq: Once | INTRAVENOUS | Status: AC
  Administered 2021-10-29: 800 mg via INTRAVENOUS
  Filled 2021-10-29: qty 50

## 2021-10-29 MED ORDER — HEPARIN SOD (PORK) LOCK FLUSH 100 UNIT/ML IV SOLN
500.0000 [IU] | Freq: Once | INTRAVENOUS | Status: AC | PRN
  Administered 2021-10-29: 500 [IU]
  Filled 2021-10-29: qty 5

## 2021-10-29 MED ORDER — SODIUM CHLORIDE 0.9 % IV SOLN
90.0000 mg/m2 | Freq: Once | INTRAVENOUS | Status: AC
  Administered 2021-10-29: 200 mg via INTRAVENOUS
  Filled 2021-10-29: qty 8

## 2021-10-29 MED ORDER — DIPHENHYDRAMINE HCL 25 MG PO CAPS
50.0000 mg | ORAL_CAPSULE | Freq: Once | ORAL | Status: AC
  Administered 2021-10-29: 50 mg via ORAL
  Filled 2021-10-29: qty 2

## 2021-10-29 MED ORDER — SODIUM CHLORIDE 0.9 % IV SOLN
140.0000 mg | Freq: Once | INTRAVENOUS | Status: AC
  Administered 2021-10-29: 140 mg via INTRAVENOUS
  Filled 2021-10-29: qty 7

## 2021-10-29 MED ORDER — ACETAMINOPHEN 325 MG PO TABS
650.0000 mg | ORAL_TABLET | Freq: Once | ORAL | Status: AC
  Administered 2021-10-29: 650 mg via ORAL
  Filled 2021-10-29: qty 2

## 2021-10-29 MED ORDER — SODIUM CHLORIDE 0.9 % IV SOLN
10.0000 mg | Freq: Once | INTRAVENOUS | Status: AC
  Administered 2021-10-29: 10 mg via INTRAVENOUS
  Filled 2021-10-29: qty 10

## 2021-10-29 MED FILL — Dexamethasone Sodium Phosphate Inj 100 MG/10ML: INTRAMUSCULAR | Qty: 1 | Status: AC

## 2021-10-29 NOTE — Patient Instructions (Signed)
[facility] AT [place]  Discharge Instructions: Thank you for choosing [facility] to provide your oncology and hematology care.  If you have a lab appointment with the [facility], please go directly to the [facility] and check in at the registration area.  Wear comfortable clothing and clothing appropriate for easy access to any Portacath or PICC line.   We strive to give you quality time with your provider. You may need to reschedule your appointment if you arrive late (15 or more minutes).  Arriving late affects you and other patients whose appointments are after yours.  Also, if you miss three or more appointments without notifying the office, you may be dismissed from the clinic at the provider's discretion.      For prescription refill requests, have your pharmacy contact our office and allow 72 hours for refills to be completed.    Today you received the following chemotherapy and/or immunotherapy agents RITUXAN, POLIVY, BENDEKA      To help prevent nausea and vomiting after your treatment, we encourage you to take your nausea medication as directed.  BELOW ARE SYMPTOMS THAT SHOULD BE REPORTED IMMEDIATELY: *FEVER GREATER THAN 100.4 F (38 C) OR HIGHER *CHILLS OR SWEATING *NAUSEA AND VOMITING THAT IS NOT CONTROLLED WITH YOUR NAUSEA MEDICATION *UNUSUAL SHORTNESS OF BREATH *UNUSUAL BRUISING OR BLEEDING *URINARY PROBLEMS (pain or burning when urinating, or frequent urination) *BOWEL PROBLEMS (unusual diarrhea, constipation, pain near the anus) TENDERNESS IN MOUTH AND THROAT WITH OR WITHOUT PRESENCE OF ULCERS (sore throat, sores in mouth, or a toothache) UNUSUAL RASH, SWELLING OR PAIN  UNUSUAL VAGINAL DISCHARGE OR ITCHING   Items with * indicate a potential emergency and should be followed up as soon as possible or go to the Emergency Department if any problems should occur.  Please show the CHEMOTHERAPY ALERT CARD or IMMUNOTHERAPY ALERT CARD  at check-in to the Emergency Department and triage nurse.  Should you have questions after your visit or need to cancel or reschedule your appointment, please contact [facility] [place] AT [place]  [phone] and follow the prompts.  Office hours are 8:00 a.m. to 4:30 p.m. Monday - Friday. Please note that voicemails left after 4:00 p.m. may not be returned until the following business day.  We are closed weekends and major holidays. You have access to a nurse at all times for urgent questions. Please call the main number to the clinic [phone] and follow the prompts.  For any non-urgent questions, you may also contact your provider using MyChart. We now offer e-Visits for anyone 42 and older to request care online for non-urgent symptoms. For details visit [email or web address].   Also download the MyChart app! Go to the app store, search "MyChart", open the app, select [facility], and log in with your MyChart username and password.  Masks are optional in the cancer centers. If you would like for your care team to wear a mask while they are taking care of you, please let them know. For doctor visits, patients may have with them one support person who is at least 62 years old. At this time, visitors are not allowed in the infusion area.  Rituximab Injection What is this medication? RITUXIMAB (ri TUX i mab) treats leukemia and lymphoma. It works by blocking a protein that causes cancer cells to grow and multiply. This helps to slow or stop the spread of cancer cells. It may also be used to treat autoimmune conditions, such as arthritis. It works  by slowing down an overactive immune system. It is a monoclonal antibody. This medicine may be used for other purposes; ask your health care provider or pharmacist if you have questions. COMMON BRAND NAME(S): RIABNI, Rituxan, RUXIENCE, truxima What should I tell my care team before I take this medication? They need to know if you have  any of these conditions: Chest pain Heart disease Immune system problems Infection, such as chickenpox, cold sores, hepatitis B, herpes Irregular heartbeat or rhythm Kidney disease Low blood counts, such as low white cells, platelets, red cells Lung disease Recent or upcoming vaccine An unusual or allergic reaction to rituximab, other medications, foods, dyes, or preservatives Pregnant or trying to get pregnant Breast-feeding How should I use this medication? This medication is injected into a vein. It is given by a care team in a hospital or clinic setting. A special MedGuide will be given to you before each treatment. Be sure to read this information carefully each time. Talk to your care team about the use of this medication in children. While this medication may be prescribed for children as young as 6 months for selected conditions, precautions do apply. Overdosage: If you think you have taken too much of this medicine contact a poison control center or emergency room at once. NOTE: This medicine is only for you. Do not share this medicine with others. What if I miss a dose? Keep appointments for follow-up doses. It is important not to miss your dose. Call your care team if you are unable to keep an appointment. What may interact with this medication? Do not take this medication with any of the following: Live vaccines This medication may also interact with the following: Cisplatin This list may not describe all possible interactions. Give your health care provider a list of all the medicines, herbs, non-prescription drugs, or dietary supplements you use. Also tell them if you smoke, drink alcohol, or use illegal drugs. Some items may interact with your medicine. What should I watch for while using this medication? Your condition will be monitored carefully while you are receiving this medication. You may need blood work while taking this medication. This medication can cause  serious infusion reactions. To reduce the risk your care team may give you other medications to take before receiving this one. Be sure to follow the directions from your care team. This medication may increase your risk of getting an infection. Call your care team for advice if you get a fever, chills, sore throat, or other symptoms of a cold or flu. Do not treat yourself. Try to avoid being around people who are sick. Call your care team if you are around anyone with measles, chickenpox, or if you develop sores or blisters that do not heal properly. Avoid taking medications that contain aspirin, acetaminophen, ibuprofen, naproxen, or ketoprofen unless instructed by your care team. These medications may hide a fever. This medication may cause serious skin reactions. They can happen weeks to months after starting the medication. Contact your care team right away if you notice fevers or flu-like symptoms with a rash. The rash may be red or purple and then turn into blisters or peeling of the skin. You may also notice a red rash with swelling of the face, lips, or lymph nodes in your neck or under your arms. In some patients, this medication may cause a serious brain infection that may cause death. If you have any problems seeing, thinking, speaking, walking, or standing, tell your care team  right away. If you cannot reach your care team, urgently seek another source of medical care. Talk to your care team if you may be pregnant. Serious birth defects can occur if you take this medication during pregnancy and for 12 months after the last dose. You will need a negative pregnancy test before starting this medication. Contraception is recommended while taking this medication and for 12 months after the last dose. Your care team can help you find the option that works for you. Do not breastfeed while taking this medication and for at least 6 months after the last dose. What side effects may I notice from receiving  this medication? Side effects that you should report to your care team as soon as possible: Allergic reactions or angioedema--skin rash, itching or hives, swelling of the face, eyes, lips, tongue, arms, or legs, trouble swallowing or breathing Bowel blockage--stomach cramping, unable to have a bowel movement or pass gas, loss of appetite, vomiting Dizziness, loss of balance or coordination, confusion or trouble speaking Heart attack--pain or tightness in the chest, shoulders, arms, or jaw, nausea, shortness of breath, cold or clammy skin, feeling faint or lightheaded Heart rhythm changes--fast or irregular heartbeat, dizziness, feeling faint or lightheaded, chest pain, trouble breathing Infection--fever, chills, cough, sore throat, wounds that don't heal, pain or trouble when passing urine, general feeling of discomfort or being unwell Infusion reactions--chest pain, shortness of breath or trouble breathing, feeling faint or lightheaded Kidney injury--decrease in the amount of urine, swelling of the ankles, hands, or feet Liver injury--right upper belly pain, loss of appetite, nausea, light-colored stool, dark yellow or brown urine, yellowing skin or eyes, unusual weakness or fatigue Redness, blistering, peeling, or loosening of the skin, including inside the mouth Stomach pain that is severe, does not go away, or gets worse Tumor lysis syndrome (TLS)--nausea, vomiting, diarrhea, decrease in the amount of urine, dark urine, unusual weakness or fatigue, confusion, muscle pain or cramps, fast or irregular heartbeat, joint pain Side effects that usually do not require medical attention (report to your care team if they continue or are bothersome): Headache Joint pain Nausea Runny or stuffy nose Unusual weakness or fatigue This list may not describe all possible side effects. Call your doctor for medical advice about side effects. You may report side effects to FDA at 1-800-FDA-1088. Where should I  keep my medication? This medication is given in a hospital or clinic. It will not be stored at home. NOTE: This sheet is a summary. It may not cover all possible information. If you have questions about this medicine, talk to your doctor, pharmacist, or health care provider.  2023 Elsevier/Gold Standard (2021-07-20 00:00:00)   Polatuzumab Vedotin Injection What is this medication? POLATUZUMAB VEDOTIN (poe la tooz ue mab ve doe tin) treats lymphoma. It works by blocking a protein that causes cancer cells to grow and multiply. This helps to slow or stop the spread of cancer cells. This medicine may be used for other purposes; ask your health care provider or pharmacist if you have questions. COMMON BRAND NAME(S): Denyse Dago What should I tell my care team before I take this medication? They need to know if you have any of these conditions: Infection, such as chickenpox, cold sores, herpes Liver disease Low blood cell levels, such as white cells, red cells, platelets Tingling of the fingers or toes or other nerve disorder An unusual or allergic reaction to polatuzumab vedotin, other medications, foods, dyes, or preservatives If you or your partner are pregnant or  trying to get pregnant Breast-feeding How should I use this medication? This medication is infused into a vein. It is given by your care team in a hospital or clinic setting. Talk to your care team about the use of this medication in children. Special care may be needed. Overdosage: If you think you have taken too much of this medicine contact a poison control center or emergency room at once. NOTE: This medicine is only for you. Do not share this medicine with others. What if I miss a dose? Keep appointments for follow-up doses. It is important not to miss your dose. Call your care team if you are unable to keep an appointment. What may interact with this medication? Certain antibiotics, such as clarithromycin, telithromycin Certain  antivirals for HIV or AIDS Certain medications for fungal infections, such as ketoconazole, itraconazole, posaconazole, voriconazole Certain medications for seizures, such as carbamazepine, phenobarbital, phenytoin This list may not describe all possible interactions. Give your health care provider a list of all the medicines, herbs, non-prescription drugs, or dietary supplements you use. Also tell them if you smoke, drink alcohol, or use illegal drugs. Some items may interact with your medicine. What should I watch for while using this medication? Your condition will be monitored carefully while you are receiving this medication. This medication may make you feel generally unwell. This is not uncommon as chemotherapy can affect healthy cells as well as cancer cells. Report any side effects. Continue your course of treatment even though you feel ill unless your care team tells you to stop. You may need blood work while taking this medication. This medication may increase your risk of getting an infection. Call your care team for advice if you get a fever, chills, sore throat, or other symptoms of a cold or flu. Do not treat yourself. Try to avoid being around people who are sick. This medication may increase your risk to bruise or bleed. Call your care team if you notice any unusual bleeding. In some patients, this medication may cause a serious brain infection that may cause death. If you have any problems seeing, thinking, speaking, walking, or standing, tell your care team right away. If you cannot reach your care team, urgently seek other source of medical care. Talk to your care team if you wish to become pregnant or think you might be pregnant. This medication can cause serious birth defects if taken during pregnancy or for 3 months after the last dose.  A negative pregnancy test is required before starting this medication. A reliable form of contraception is recommended while taking this medication  and for 3 months after the last dose. Talk to your care team about effective forms of contraception. Do not father a child while taking this medication or for 5 months after the last dose. Use a condom while having sex during this time period. Do not breastfeed while taking this medication and for 2 months after the last dose. This medication may cause infertility. Talk to your care team if you are concerned about your fertility. What side effects may I notice from receiving this medication? Side effects that you should report to your care team as soon as possible: Allergic reactions--skin rash, itching, hives, swelling of the face, lips, tongue, or throat Dizziness, loss of balance or coordination, confusion or trouble speaking Infection--fever, chills, cough, sore throat, wounds that don't heal, pain or trouble when passing urine, general feeling of discomfort or being unwell Infusion reactions--chest pain, shortness of breath or trouble  breathing, feeling faint or lightheaded Liver injury--right upper belly pain, loss of appetite, nausea, light-colored stool, dark yellow or brown urine, yellowing skin or eyes, unusual weakness or fatigue Low red blood cell level--unusual weakness or fatigue, dizziness, headache, trouble breathing Pain, tingling, or numbness in the hands or feet Stomach pain, unusual weakness or fatigue, nausea, vomiting, diarrhea, or fever that lasts longer than expected Tumor lysis syndrome (TLS)--nausea, vomiting, diarrhea, decrease in the amount of urine, dark urine, unusual weakness or fatigue, confusion, muscle pain or cramps, fast or irregular heartbeat, joint pain Unusual bruising or bleeding Side effects that usually do not require medical attention (report these to your care team if they continue or are bothersome): Diarrhea Dizziness Loss of appetite Vomiting Weight loss This list may not describe all possible side effects. Call your doctor for medical advice about  side effects. You may report side effects to FDA at 1-800-FDA-1088. Where should I keep my medication? This medication is given in a hospital or clinic. It will not be stored at home. NOTE: This sheet is a summary. It may not cover all possible information. If you have questions about this medicine, talk to your doctor, pharmacist, or health care provider.  2023 Elsevier/Gold Standard (2021-07-08 00:00:00)  Bendamustine Injection What is this medication? BENDAMUSTINE (BEN da MUS teen) treats leukemia and lymphoma. It works by slowing down the growth of cancer cells. This medicine may be used for other purposes; ask your health care provider or pharmacist if you have questions. COMMON BRAND NAME(S): Oren Beckmann, VIVIMUSTA What should I tell my care team before I take this medication? They need to know if you have any of these conditions: Infection, especially a viral infection, such as chickenpox, cold sores, herpes Kidney disease Liver disease An unusual or allergic reaction to bendamustine, mannitol, other medications, foods, dyes, or preservatives Pregnant or trying to get pregnant Breast-feeding How should I use this medication? This medication is injected into a vein. It is given by your care team in a hospital or clinic setting. Talk to your care team about the use of this medication in children. Special care may be needed. Overdosage: If you think you have taken too much of this medicine contact a poison control center or emergency room at once. NOTE: This medicine is only for you. Do not share this medicine with others. What if I miss a dose? Keep appointments for follow-up doses. It is important not to miss your dose. Call your care team if you are unable to keep an appointment. What may interact with this medication? Do not take this medication with any of the following: Clozapine This medication may also interact with the  following: Atazanavir Cimetidine Ciprofloxacin Enoxacin Fluvoxamine Medications for seizures, such as carbamazepine, phenobarbital Mexiletine Rifampin Tacrine Thiabendazole Zileuton This list may not describe all possible interactions. Give your health care provider a list of all the medicines, herbs, non-prescription drugs, or dietary supplements you use. Also tell them if you smoke, drink alcohol, or use illegal drugs. Some items may interact with your medicine. What should I watch for while using this medication? Visit your care team for regular checks on your progress. This medication may make you feel generally unwell. This is not uncommon, as chemotherapy can affect healthy cells as well as cancer cells. Report any side effects. Continue your course of treatment even though you feel ill unless your care team tells you to stop. You may need blood work while taking this medication. This medication may  increase your risk of getting an infection. Call your care team for advice if you get a fever, chills, sore throat, or other symptoms of a cold or flu. Do not treat yourself. Try to avoid being around people who are sick. This medication may cause serious skin reactions. They can happen weeks to months after starting the medication. Contact your care team right away if you notice fevers or flu-like symptoms with a rash. The rash may be red or purple and then turn into blisters or peeling of the skin. You may also notice a red rash with swelling of the face, lips, or lymph nodes in your neck or under your arms. In some patients, this medication may cause a serious brain infection that may cause death. If you have any problems seeing, thinking, speaking, walking, or standing, tell your care team right away. If you cannot reach your care team, urgently seek other source of medical care. This medication may increase your risk to bruise or bleed. Call your care team if you notice any unusual  bleeding. Talk to your care team about your risk of cancer. You may be more at risk for certain types of cancer if you take this medication. Talk to your care team about your risk of skin cancer. You may be more at risk for skin cancer if you take this medication. Talk to your care team if you or your partner wish to become pregnant or think either of you might be pregnant. This medication can cause serious birth defects if taken during pregnancy or for up to 6 months after the last dose. A negative pregnancy test is required before starting this medication. A reliable form of contraception is recommended while taking this medication and for 6 months after the last dose. Talk to your care team about reliable forms of contraception. Wear a condom while taking this medication and for at least 3 months after the last dose. Do not breast-feed while taking this medication or for at least 1 week after the last dose. This medication may cause infertility. Talk to your care team if you are concerned about your fertility. What side effects may I notice from receiving this medication? Side effects that you should report to your care team as soon as possible: Allergic reactions--skin rash, itching, hives, swelling of the face, lips, tongue, or throat Infection--fever, chills, cough, sore throat, wounds that don't heal, pain or trouble when passing urine, general feeling of discomfort or being unwell Infusion reactions--chest pain, shortness of breath or trouble breathing, feeling faint or lightheaded Liver injury--right upper belly pain, loss of appetite, nausea, light-colored stool, dark yellow or brown urine, yellowing skin or eyes, unusual weakness or fatigue Low red blood cell level--unusual weakness or fatigue, dizziness, headache, trouble breathing Painful swelling, warmth, or redness of the skin, blisters or sores at the infusion site Rash, fever, and swollen lymph nodes Redness, blistering, peeling, or  loosening of the skin, including inside the mouth Tumor lysis syndrome (TLS)--nausea, vomiting, diarrhea, decrease in the amount of urine, dark urine, unusual weakness or fatigue, confusion, muscle pain or cramps, fast or irregular heartbeat, joint pain Unusual bruising or bleeding Side effects that usually do not require medical attention (report to your care team if they continue or are bothersome): Diarrhea Fatigue Headache Loss of appetite Nausea Vomiting This list may not describe all possible side effects. Call your doctor for medical advice about side effects. You may report side effects to FDA at 1-800-FDA-1088. Where should I keep  my medication? This medication is given in a hospital or clinic. It will not be stored at home. NOTE: This sheet is a summary. It may not cover all possible information. If you have questions about this medicine, talk to your doctor, pharmacist, or health care provider.  2023 Elsevier/Gold Standard (2007-04-22 00:00:00)

## 2021-10-29 NOTE — Progress Notes (Signed)
Pt tolerated infusion

## 2021-10-30 ENCOUNTER — Inpatient Hospital Stay: Payer: Self-pay

## 2021-10-30 VITALS — BP 131/77 | HR 62 | Temp 98.0°F | Resp 18

## 2021-10-30 DIAGNOSIS — C8510 Unspecified B-cell lymphoma, unspecified site: Secondary | ICD-10-CM

## 2021-10-30 MED ORDER — HEPARIN SOD (PORK) LOCK FLUSH 100 UNIT/ML IV SOLN
500.0000 [IU] | Freq: Once | INTRAVENOUS | Status: AC | PRN
  Administered 2021-10-30: 500 [IU]
  Filled 2021-10-30: qty 5

## 2021-10-30 MED ORDER — SODIUM CHLORIDE 0.9% FLUSH
10.0000 mL | INTRAVENOUS | Status: DC | PRN
  Administered 2021-10-30: 10 mL
  Filled 2021-10-30: qty 10

## 2021-10-30 MED ORDER — SODIUM CHLORIDE 0.9 % IV SOLN
10.0000 mg | Freq: Once | INTRAVENOUS | Status: AC
  Administered 2021-10-30: 10 mg via INTRAVENOUS
  Filled 2021-10-30: qty 10

## 2021-10-30 MED ORDER — SODIUM CHLORIDE 0.9 % IV SOLN
90.0000 mg/m2 | Freq: Once | INTRAVENOUS | Status: AC
  Administered 2021-10-30: 200 mg via INTRAVENOUS
  Filled 2021-10-30: qty 8

## 2021-10-30 MED ORDER — SODIUM CHLORIDE 0.9 % IV SOLN
Freq: Once | INTRAVENOUS | Status: AC
  Filled 2021-10-30: qty 250

## 2021-10-30 NOTE — Patient Instructions (Signed)
[facility] CANCER [facility] AT [place]  Discharge Instructions: Thank you for choosing [facility] to provide your oncology and hematology care.  If you have a lab appointment with the [facility], please go directly to the [facility] and check in at the registration area.  Wear comfortable clothing and clothing appropriate for easy access to any Portacath or PICC line.   We strive to give you quality time with your provider. You may need to reschedule your appointment if you arrive late (15 or more minutes).  Arriving late affects you and other patients whose appointments are after yours.  Also, if you miss three or more appointments without notifying the office, you may be dismissed from the clinic at the provider's discretion.      For prescription refill requests, have your pharmacy contact our office and allow 72 hours for refills to be completed.    Today you received the following chemotherapy and/or immunotherapy agents BENDEKA       To help prevent nausea and vomiting after your treatment, we encourage you to take your nausea medication as directed.  BELOW ARE SYMPTOMS THAT SHOULD BE REPORTED IMMEDIATELY: *FEVER GREATER THAN 100.4 F (38 C) OR HIGHER *CHILLS OR SWEATING *NAUSEA AND VOMITING THAT IS NOT CONTROLLED WITH YOUR NAUSEA MEDICATION *UNUSUAL SHORTNESS OF BREATH *UNUSUAL BRUISING OR BLEEDING *URINARY PROBLEMS (pain or burning when urinating, or frequent urination) *BOWEL PROBLEMS (unusual diarrhea, constipation, pain near the anus) TENDERNESS IN MOUTH AND THROAT WITH OR WITHOUT PRESENCE OF ULCERS (sore throat, sores in mouth, or a toothache) UNUSUAL RASH, SWELLING OR PAIN  UNUSUAL VAGINAL DISCHARGE OR ITCHING   Items with * indicate a potential emergency and should be followed up as soon as possible or go to the Emergency Department if any problems should occur.  Please show the CHEMOTHERAPY ALERT CARD or IMMUNOTHERAPY ALERT CARD at check-in to  the Emergency Department and triage nurse.  Should you have questions after your visit or need to cancel or reschedule your appointment, please contact [facility] CANCER [place] [facility] [place]  [phone] and follow the prompts.  Office hours are 8:00 a.m. to 4:30 p.m. Monday - Friday. Please note that voicemails left after 4:00 p.m. may not be returned until the following business day.  We are closed weekends and major holidays. You have access to a nurse at all times for urgent questions. Please call the main number to the clinic [phone] and follow the prompts.  For any non-urgent questions, you may also contact your provider using MyChart. We now offer e-Visits for anyone 57 and older to request care online for non-urgent symptoms. For details visit [email or web address].   Also download the MyChart app! Go to the app store, search "MyChart", open the app, select [facility], and log in with your MyChart username and password.  Masks are optional in the cancer centers. If you would like for your care team to wear a mask while they are taking care of you, please let them know. For doctor visits, patients may have with them one support person who is at least 62 years old. At this time, visitors are not allowed in the infusion area.   Bendamustine Injection What is this medication? BENDAMUSTINE (BEN da MUS teen) treats leukemia and lymphoma. It works by slowing down the growth of cancer cells. This medicine may be used for other purposes; ask your health care provider or pharmacist if you have questions. COMMON BRAND NAME(S): Oren Beckmann, VIVIMUSTA What should I  tell my care team before I take this medication? They need to know if you have any of these conditions: Infection, especially a viral infection, such as chickenpox, cold sores, herpes Kidney disease Liver disease An unusual or allergic reaction to bendamustine, mannitol, other medications, foods, dyes, or  preservatives Pregnant or trying to get pregnant Breast-feeding How should I use this medication? This medication is injected into a vein. It is given by your care team in a hospital or clinic setting. Talk to your care team about the use of this medication in children. Special care may be needed. Overdosage: If you think you have taken too much of this medicine contact a poison control center or emergency room at once. NOTE: This medicine is only for you. Do not share this medicine with others. What if I miss a dose? Keep appointments for follow-up doses. It is important not to miss your dose. Call your care team if you are unable to keep an appointment. What may interact with this medication? Do not take this medication with any of the following: Clozapine This medication may also interact with the following: Atazanavir Cimetidine Ciprofloxacin Enoxacin Fluvoxamine Medications for seizures, such as carbamazepine, phenobarbital Mexiletine Rifampin Tacrine Thiabendazole Zileuton This list may not describe all possible interactions. Give your health care provider a list of all the medicines, herbs, non-prescription drugs, or dietary supplements you use. Also tell them if you smoke, drink alcohol, or use illegal drugs. Some items may interact with your medicine. What should I watch for while using this medication? Visit your care team for regular checks on your progress. This medication may make you feel generally unwell. This is not uncommon, as chemotherapy can affect healthy cells as well as cancer cells. Report any side effects. Continue your course of treatment even though you feel ill unless your care team tells you to stop. You may need blood work while taking this medication. This medication may increase your risk of getting an infection. Call your care team for advice if you get a fever, chills, sore throat, or other symptoms of a cold or flu. Do not treat yourself. Try to avoid  being around people who are sick. This medication may cause serious skin reactions. They can happen weeks to months after starting the medication. Contact your care team right away if you notice fevers or flu-like symptoms with a rash. The rash may be red or purple and then turn into blisters or peeling of the skin. You may also notice a red rash with swelling of the face, lips, or lymph nodes in your neck or under your arms. In some patients, this medication may cause a serious brain infection that may cause death. If you have any problems seeing, thinking, speaking, walking, or standing, tell your care team right away. If you cannot reach your care team, urgently seek other source of medical care. This medication may increase your risk to bruise or bleed. Call your care team if you notice any unusual bleeding. Talk to your care team about your risk of cancer. You may be more at risk for certain types of cancer if you take this medication. Talk to your care team about your risk of skin cancer. You may be more at risk for skin cancer if you take this medication. Talk to your care team if you or your partner wish to become pregnant or think either of you might be pregnant. This medication can cause serious birth defects if taken during pregnancy or for  up to 6 months after the last dose. A negative pregnancy test is required before starting this medication. A reliable form of contraception is recommended while taking this medication and for 6 months after the last dose. Talk to your care team about reliable forms of contraception. Wear a condom while taking this medication and for at least 3 months after the last dose. Do not breast-feed while taking this medication or for at least 1 week after the last dose. This medication may cause infertility. Talk to your care team if you are concerned about your fertility. What side effects may I notice from receiving this medication? Side effects that you should  report to your care team as soon as possible: Allergic reactions--skin rash, itching, hives, swelling of the face, lips, tongue, or throat Infection--fever, chills, cough, sore throat, wounds that don't heal, pain or trouble when passing urine, general feeling of discomfort or being unwell Infusion reactions--chest pain, shortness of breath or trouble breathing, feeling faint or lightheaded Liver injury--right upper belly pain, loss of appetite, nausea, light-colored stool, dark yellow or brown urine, yellowing skin or eyes, unusual weakness or fatigue Low red blood cell level--unusual weakness or fatigue, dizziness, headache, trouble breathing Painful swelling, warmth, or redness of the skin, blisters or sores at the infusion site Rash, fever, and swollen lymph nodes Redness, blistering, peeling, or loosening of the skin, including inside the mouth Tumor lysis syndrome (TLS)--nausea, vomiting, diarrhea, decrease in the amount of urine, dark urine, unusual weakness or fatigue, confusion, muscle pain or cramps, fast or irregular heartbeat, joint pain Unusual bruising or bleeding Side effects that usually do not require medical attention (report to your care team if they continue or are bothersome): Diarrhea Fatigue Headache Loss of appetite Nausea Vomiting This list may not describe all possible side effects. Call your doctor for medical advice about side effects. You may report side effects to FDA at 1-800-FDA-1088. Where should I keep my medication? This medication is given in a hospital or clinic. It will not be stored at home. NOTE: This sheet is a summary. It may not cover all possible information. If you have questions about this medicine, talk to your doctor, pharmacist, or health care provider.  2023 Elsevier/Gold Standard (2007-04-22 00:00:00)

## 2021-11-02 ENCOUNTER — Ambulatory Visit: Payer: Medicaid Other

## 2021-11-02 ENCOUNTER — Inpatient Hospital Stay: Payer: Self-pay

## 2021-11-02 ENCOUNTER — Other Ambulatory Visit: Payer: Medicaid Other

## 2021-11-02 DIAGNOSIS — C8510 Unspecified B-cell lymphoma, unspecified site: Secondary | ICD-10-CM

## 2021-11-02 MED ORDER — PEGFILGRASTIM-CBQV 6 MG/0.6ML ~~LOC~~ SOSY
6.0000 mg | PREFILLED_SYRINGE | Freq: Once | SUBCUTANEOUS | Status: AC
  Administered 2021-11-02: 6 mg via SUBCUTANEOUS
  Filled 2021-11-02: qty 0.6

## 2021-11-04 ENCOUNTER — Other Ambulatory Visit: Payer: Self-pay | Admitting: *Deleted

## 2021-11-04 DIAGNOSIS — C8510 Unspecified B-cell lymphoma, unspecified site: Secondary | ICD-10-CM

## 2021-11-05 ENCOUNTER — Encounter: Payer: Self-pay | Admitting: Medical Oncology

## 2021-11-05 ENCOUNTER — Inpatient Hospital Stay: Payer: Self-pay

## 2021-11-05 ENCOUNTER — Inpatient Hospital Stay (HOSPITAL_BASED_OUTPATIENT_CLINIC_OR_DEPARTMENT_OTHER): Payer: Self-pay | Admitting: Medical Oncology

## 2021-11-05 VITALS — BP 104/66 | HR 79 | Temp 97.3°F | Wt 184.0 lb

## 2021-11-05 DIAGNOSIS — C8510 Unspecified B-cell lymphoma, unspecified site: Secondary | ICD-10-CM

## 2021-11-05 LAB — CBC WITH DIFFERENTIAL/PLATELET
Abs Immature Granulocytes: 0.99 10*3/uL — ABNORMAL HIGH (ref 0.00–0.07)
Basophils Absolute: 0 10*3/uL (ref 0.0–0.1)
Basophils Relative: 0 %
Eosinophils Absolute: 0.2 10*3/uL (ref 0.0–0.5)
Eosinophils Relative: 0 %
HCT: 38.6 % — ABNORMAL LOW (ref 39.0–52.0)
Hemoglobin: 13.1 g/dL (ref 13.0–17.0)
Immature Granulocytes: 2 %
Lymphocytes Relative: 2 %
Lymphs Abs: 0.9 10*3/uL (ref 0.7–4.0)
MCH: 31.6 pg (ref 26.0–34.0)
MCHC: 33.9 g/dL (ref 30.0–36.0)
MCV: 93 fL (ref 80.0–100.0)
Monocytes Absolute: 2.9 10*3/uL — ABNORMAL HIGH (ref 0.1–1.0)
Monocytes Relative: 7 %
Neutro Abs: 36.8 10*3/uL — ABNORMAL HIGH (ref 1.7–7.7)
Neutrophils Relative %: 89 %
Platelets: 218 10*3/uL (ref 150–400)
RBC: 4.15 MIL/uL — ABNORMAL LOW (ref 4.22–5.81)
RDW: 14.6 % (ref 11.5–15.5)
Smear Review: NORMAL
WBC: 41.8 10*3/uL — ABNORMAL HIGH (ref 4.0–10.5)
nRBC: 0 % (ref 0.0–0.2)

## 2021-11-05 LAB — BASIC METABOLIC PANEL
Anion gap: 6 (ref 5–15)
BUN: 11 mg/dL (ref 8–23)
CO2: 27 mmol/L (ref 22–32)
Calcium: 9.4 mg/dL (ref 8.9–10.3)
Chloride: 103 mmol/L (ref 98–111)
Creatinine, Ser: 1.18 mg/dL (ref 0.61–1.24)
GFR, Estimated: >60 mL/min (ref >60)
Glucose, Bld: 126 mg/dL — ABNORMAL HIGH (ref 70–99)
Potassium: 3.6 mmol/L (ref 3.5–5.1)
Sodium: 136 mmol/L (ref 135–145)

## 2021-11-05 MED ORDER — HEPARIN SOD (PORK) LOCK FLUSH 100 UNIT/ML IV SOLN
500.0000 [IU] | Freq: Once | INTRAVENOUS | Status: AC
  Administered 2021-11-05: 500 [IU] via INTRAVENOUS
  Filled 2021-11-05: qty 5

## 2021-11-05 MED ORDER — SODIUM CHLORIDE 0.9% FLUSH
10.0000 mL | Freq: Once | INTRAVENOUS | Status: AC
  Administered 2021-11-05: 10 mL via INTRAVENOUS
  Filled 2021-11-05: qty 10

## 2021-11-05 NOTE — Progress Notes (Signed)
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 Heme/Onc history:  Patient is a 61 year old male who underwent CT chest for symptoms of exertional shortness of breath which showed a right paratracheal mass 6.4 x 4.7 cm along with lymphadenopathy in the upper abdomen concerning for Lymphoma.  This was followed by a PET CT scan which showed extensive FDG avid adenopathy in the neck chest abdomen and pelvis.  FDG avid splenic lesions.  Solitary intramuscular FDG avid lesion in the right biceps femoris muscle.   Supraclavicular excisional lymph node biopsy showed high-grade B-cell lymphoma germinal center type Ki-67 greater than 95%.  FISH testing was positive for Bcl-2 BCL6 and MYC consistent with triple hit lymphoma.  By NCCN IPI score would be 4 based on age and elevated LDH and stage IV (intramuscular biceps femoris lesion) which puts him in the high intermediate risk group. 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   Patient received RCHOP for cycle 1 with plans for [facility] with cycle 2. IT MTX for CNS prophylaxis and he has received 3 cycles but declined further cycles.  MRI brain negative for lymphoma.  Chemotherapy was not being able to escalated upwards due to neutropenia which has persisted to 3 weeks and he has been receiving treatment every 4 weeks   PET CT scan after 3 cycles of chemotherapy  showedComplete or near complete metabolic response.  Residual disease in the anterior upper right mediastinum slightly greater than background mediastinal activity   Patient found to have a clinically palpable right supraclavicular lymph node which was subsequently biopsied and was consistent with previously diagnosed lymphoma.  PET CT scan showedNew right level 5 adenopathy in the lower neck with dominant lymph node 1.6 cm Deauville 5.  No other findings of acute active lymphoma in chest abdomen pelvis and bones.  MRI brain negative for leptomeningeal disease.   Patient seen for second opinion by Dr. Lonia Blood at [facility] for consideration of CAR-T cell therapy.  Patient did not wish to proceed with CAR-T cell treatment after reviewing risks versus benefits.  Moreover patient's health insurance will also not cover CAR-T cell therapy. Patient started on second line tafasitamab/Revlimid regimen in June 2023.  He is on Revlimid 15 mg 3 weeks on and 1 week off  Interval history- Patient reports that he is doing well. He is tolerating his treatment well. He reports that the lymph node of his neck has gone down in size since starting his chemotherapy. Only side effect is a slightly lower appetite. He continues to try to eat and drink well. No fevers, night sweats, muscle pains, N/V/D/C.    ECOG PS- 0 Pain scale- 0   Review of systems- Review of Systems  Constitutional:  Negative for chills, fever, malaise/fatigue and weight loss.  HENT:  Negative for congestion, ear discharge and nosebleeds.   Eyes:  Negative for blurred vision.  Respiratory:  Negative for cough, hemoptysis, sputum production, shortness of breath and wheezing.   Cardiovascular:  Negative  for chest pain, palpitations, orthopnea and claudication.  Gastrointestinal:  Negative for abdominal pain, blood in stool, constipation, diarrhea, heartburn, melena, nausea and vomiting.  Genitourinary:  Negative for dysuria, flank pain, frequency, hematuria  and urgency.  Musculoskeletal:  Negative for back pain, joint pain and myalgias.  Skin:  Negative for rash.  Neurological:  Negative for dizziness, tingling, focal weakness, seizures, weakness and headaches.  Endo/Heme/Allergies:  Does not bruise/bleed easily.  Psychiatric/Behavioral:  Negative for depression and suicidal ideas. The patient does not have insomnia.       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     Past Surgical History:  Procedure Laterality Date   BONE MARROW BIOPSY     COLONOSCOPY     EXCISION MASS NECK Right 06/12/2020   Procedure: EXCISION MASS NECK;  Surgeon: Jules Husbands, MD;  Location: [facility] ORS;  Service: General;  Laterality: Right;   HERNIA REPAIR Right    at age 51-RIH   66 [place] Right 06/24/2020   Procedure: INSERTION PORT-A-CATH;  Surgeon: Jules Husbands, MD;  Location: [facility] ORS;  Service: General;  Laterality: Right;    Social History   Socioeconomic History   Marital status: Single    Spouse name: Not on file   Number of children: Not on file   Years of education: Not on file   Highest education level: Not on file  Occupational History   Not on file  Tobacco Use   Smoking status: Former    Types: Cigarettes    Quit date: 06/13/2020    Years since quitting: 1.3   Smokeless tobacco: Never  Vaping Use   Vaping Use: Never used  Substance and Sexual Activity   Alcohol use: Not Currently   Drug use: Not Currently    Types: Marijuana   Sexual activity: Not Currently  Other Topics Concern   Not on file  Social History Narrative   Has daughter and grandson in the home   Social Determinants of Health   Financial Resource Strain: Not on file  Food Insecurity: Not on file  Transportation Needs: Not on file  Physical Activity: Not on file  Stress: Not on file   Social Connections: Not on file  Intimate Partner Violence: Not on file    Family History  Problem Relation Age of Onset   Anemia Mother    Hypertension Mother    Goiter Mother    Cancer Father    Cancer Sister    Multiple sclerosis Sister      Current Outpatient Medications:    allopurinol (ZYLOPRIM) 300 MG tablet, TAKE 1 TABLET BY MOUTH EVERY DAY, Disp: 30 tablet, Rfl: 1   aspirin EC 81 MG tablet, Take 1 tablet (81 mg total) by mouth daily. Swallow whole., Disp: 30 tablet, Rfl: 0   atenolol (TENORMIN) 25 MG tablet, Take 25 mg by mouth daily., Disp: , Rfl:    cholecalciferol (VITAMIN D3) 25 MCG (1000 UNIT) tablet, Take 1,000 Units by mouth daily., Disp: , Rfl:    lisinopril (ZESTRIL) 10 MG tablet, Take 10 mg by mouth daily., Disp: , Rfl:    Omega-3 Fatty Acids (FISH OIL ADULT GUMMIES PO), Take by mouth., Disp: , Rfl:    Tenofovir Alafenamide Fumarate (VEMLIDY) 25 MG TABS, Take 1 tablet (25 mg  total) by mouth daily. Take with food., Disp: 30 tablet, Rfl: 2   vitamin C (ASCORBIC ACID) 500 MG tablet, Take 500 mg by mouth daily., Disp: , Rfl:    sulfamethoxazole-trimethoprim (BACTRIM DS) 800-160 MG tablet, Take 1 tablet by mouth 3 (three) times a week. (Patient not taking: Reported on 10/28/2021), Disp: 12 tablet, Rfl: 5 No current facility-administered medications for this visit.  Facility-Administered Medications Ordered in Other Visits:    0.9 %  sodium chloride infusion, , Intravenous, Continuous, Sindy Guadeloupe, MD, Stopped at 09/09/20 1507   0.9 %  sodium chloride infusion, , Intravenous, Continuous, Cammie Sickle, MD, Stopped at 09/11/20 1502   0.9 %  sodium chloride infusion, , Intravenous, Continuous, Sindy Guadeloupe, MD, Stopped at 11/06/20 1424   heparin lock flush 100 unit/mL, 500 Units, Intravenous, Once, Sindy Guadeloupe, MD   heparin lock flush 100 unit/mL, 500 Units, Intravenous, Once, Zayvier Caravello M, PA-C   methotrexate (PF) 12 mg in sodium chloride (PF) 0.9 %  INTRATHECAL chemo injection, , Intrathecal, Once, Sindy Guadeloupe, MD  Physical exam:  Today's Vitals   11/05/21 0855  BP: 104/66  Pulse: 79  Temp: (!) 97.3 F (36.3 C)  TempSrc: Tympanic  Weight: 184 lb (83.5 kg)  PainSc: 0-No pain   Body mass index is 24.28 kg/m.  Physical Exam Constitutional:      General: He is not in acute distress. Cardiovascular:     Rate and Rhythm: Normal rate and regular rhythm.     Heart sounds: Normal heart sounds.  Pulmonary:     Effort: Pulmonary effort is normal.     Breath sounds: Normal breath sounds.  Lymphadenopathy:     Comments: Palpable right supraclavicular adenopathy  Skin:    General: Skin is warm and dry.  Neurological:     Mental Status: He is alert and oriented to person, place, and time.     .labs    Latest Ref Rng & Units 11/05/2021    8:51 AM  CMP  Glucose 70 - 99 mg/dL 126   BUN 8 - 23 mg/dL 11   Creatinine 0.61 - 1.24 mg/dL 1.18   Sodium 135 - 145 mmol/L 136   Potassium 3.5 - 5.1 mmol/L 3.6   Chloride 98 - 111 mmol/L 103   CO2 22 - 32 mmol/L 27   Calcium 8.9 - 10.3 mg/dL 9.4       Latest Ref Rng & Units 11/05/2021    8:51 AM  CBC  WBC 4.0 - 10.5 K/uL 41.8   Hemoglobin 13.0 - 17.0 g/dL 13.1   Hematocrit 39.0 - 52.0 % 38.6   Platelets 150 - 400 K/uL 218     No images are attached to the encounter.  NM PET Image Restag (PS) Skull Base To Thigh  Result Date: 10/15/2021 CLINICAL DATA:  Subsequent treatment strategy for B-cell lymphoma. EXAM: NUCLEAR MEDICINE PET SKULL BASE TO THIGH TECHNIQUE: 10.12 mCi F-18 FDG was injected intravenously. Full-ring PET imaging was performed from the skull base to thigh after the radiotracer. CT data was obtained and used for attenuation correction and anatomic localization. Fasting blood glucose: 99 mg/dl COMPARISON:  07/27/2021 FINDINGS: Mediastinal blood pool activity: SUV max 1.82 Liver activity: SUV max 2.60 NECK: -Within the right supraclavicular region there is a nodal mass  which measures 4.0 x 3.0 cm with SUV max of 14.87, image 51/2. On the previous exam this measured 1.6 x 1.8 cm with SUV max of 13.09. -Adjacent node/mass  measures 2.6 by 1.9 cm with SUV max of 12.31, image 48/2. Previously 1.8 x 1.4 cm with SUV max of 12.11. -Within the right posterior supraclavicular region there are 2 enlarging lymph nodes which measure up to 1.2 cm with SUV max of 9.41, image 57/2. Previously these nodes measured up to 7 mm with SUV max of 1.79. Incidental CT findings: none CHEST: No tracer avid axillary, mediastinal, or hilar lymph nodes. There is no tracer avid pulmonary nodule or mass. Incidental CT findings: Aortic atherosclerosis. No pericardial effusion. ABDOMEN/PELVIS: No abnormal hypermetabolic activity within the liver, pancreas, adrenal glands, or spleen. No hypermetabolic lymph nodes in the abdomen or pelvis. Incidental CT findings: Aortic atherosclerosis. SKELETON: No focal hypermetabolic activity to suggest skeletal metastasis. Incidental CT findings: IMPRESSION: 1. Interval increase in size of FDG avid right lower neck/supraclavicular adenopathy/mass compatible with progression of disease. Similar degree of FDG uptake associated with the 2 dominant nodal masses. The 2 smaller progressive right posterior supraclavicular lymph nodes exhibit significant interval increase in FDG uptake. Imaging findings are compatible with progressive Deauville criteria 5 disease. Electronically Signed   By: Kerby Moors M.D.   On: 10/15/2021 05:35     Assessment and plan- Patient is a 61 y.o. male with relapsed triple hit diffuse large B-cell lymphoma here for evaluation prior to chemotherapy.  #Triple hit diffuse large B-cell lymphoma Patient relapsed after first-line R-EPOCH chemotherapy and was switched to second line tafasitamab Revlimid combination.  He was sent to [facility] [place] for second opinion for possible CAR-T cell therapy.  However patient is uninsured and also did not wish to proceed with  CAR-T cell given the possible risk of side effects.  Unfortunately his disease is refractory to Tafasitamab/Revlimid.  He had discussion with Dr. Janese Banks during his last visit regarding his options.  Decision was made to proceed with polatuzumab/Bendamustine/Rituxan Rationale and potential side effects were discussed by Dr. Janese Banks. Today patient has no additional questions regarding his upcoming treatments Tolerating treatment well.  We discussed and reviewed his labs. He does not wish to have IVF fluids at this time.   #Positive hepatitis B core antibody.  On Tenofovir    Recommend patient to take Bactrim, Aciclovir and allopurinol.  No IVF today Follow-up with Dr. Janese Banks in 2 weeks for next cycle of chemotherapy.   Visit Diagnosis No diagnosis found.   Nelwyn Salisbury PA-C [facility] 11/05/2021

## 2021-11-09 ENCOUNTER — Ambulatory Visit: Payer: Medicaid Other

## 2021-11-09 ENCOUNTER — Ambulatory Visit: Payer: Medicaid Other | Admitting: Oncology

## 2021-11-09 ENCOUNTER — Other Ambulatory Visit: Payer: Medicaid Other

## 2021-11-12 ENCOUNTER — Other Ambulatory Visit: Payer: Self-pay | Admitting: Oncology

## 2021-11-12 ENCOUNTER — Other Ambulatory Visit (HOSPITAL_COMMUNITY): Payer: Self-pay

## 2021-11-12 DIAGNOSIS — C8510 Unspecified B-cell lymphoma, unspecified site: Secondary | ICD-10-CM

## 2021-11-12 NOTE — Progress Notes (Signed)
OFF PATHWAY REGIMEN - Lymphoma and CLL  No Change  Continue With Treatment as Ordered.  Original Decision Date/Time: 10/15/2021 17:25   [id number]:Polatuzumab 1.8 mg/kg IV D1 + Bendamustine 90 mg/m2 IV D1,2 + Rituximab 375 mg/m2 IV D1 q21 Days x 6 Cycles:   A cycle is every 21 days:     Rituximab-xxxx      Polatuzumab vedotin-piiq      Bendamustine   **Always confirm dose/schedule in your pharmacy ordering system**  Patient Characteristics: Double Hit Lymphoma, Second Line and Beyond Disease Type: Not Applicable Disease Type: Double Hit Lymphoma Disease Type: Not Applicable Line of therapy: Second Line and Beyond Intent of Therapy: Non-Curative / Palliative Intent, Discussed with Patient

## 2021-11-13 ENCOUNTER — Other Ambulatory Visit (HOSPITAL_COMMUNITY): Payer: Self-pay

## 2021-11-17 ENCOUNTER — Other Ambulatory Visit (HOSPITAL_COMMUNITY): Payer: Self-pay

## 2021-11-17 MED FILL — Dexamethasone Sodium Phosphate Inj 100 MG/10ML: INTRAMUSCULAR | Qty: 1 | Status: AC

## 2021-11-18 ENCOUNTER — Inpatient Hospital Stay: Payer: Self-pay | Attending: Oncology

## 2021-11-18 ENCOUNTER — Inpatient Hospital Stay (HOSPITAL_BASED_OUTPATIENT_CLINIC_OR_DEPARTMENT_OTHER): Payer: Self-pay | Admitting: Oncology

## 2021-11-18 ENCOUNTER — Inpatient Hospital Stay: Payer: Self-pay

## 2021-11-18 ENCOUNTER — Encounter: Payer: Self-pay | Admitting: Oncology

## 2021-11-18 VITALS — BP 116/65 | HR 63 | Resp 16

## 2021-11-18 VITALS — BP 119/70 | HR 69 | Temp 98.2°F | Resp 18 | Wt 184.6 lb

## 2021-11-18 DIAGNOSIS — Z79624 Long term (current) use of inhibitors of nucleotide synthesis: Secondary | ICD-10-CM | POA: Insufficient documentation

## 2021-11-18 DIAGNOSIS — Z5112 Encounter for antineoplastic immunotherapy: Secondary | ICD-10-CM | POA: Insufficient documentation

## 2021-11-18 DIAGNOSIS — C8510 Unspecified B-cell lymphoma, unspecified site: Secondary | ICD-10-CM

## 2021-11-18 DIAGNOSIS — Z8269 Family history of other diseases of the musculoskeletal system and connective tissue: Secondary | ICD-10-CM | POA: Insufficient documentation

## 2021-11-18 DIAGNOSIS — C8331 Diffuse large B-cell lymphoma, lymph nodes of head, face, and neck: Secondary | ICD-10-CM | POA: Insufficient documentation

## 2021-11-18 DIAGNOSIS — Z8249 Family history of ischemic heart disease and other diseases of the circulatory system: Secondary | ICD-10-CM | POA: Insufficient documentation

## 2021-11-18 DIAGNOSIS — Z5111 Encounter for antineoplastic chemotherapy: Secondary | ICD-10-CM

## 2021-11-18 DIAGNOSIS — Z87891 Personal history of nicotine dependence: Secondary | ICD-10-CM | POA: Insufficient documentation

## 2021-11-18 DIAGNOSIS — Z7961 Long term (current) use of immunomodulator: Secondary | ICD-10-CM | POA: Insufficient documentation

## 2021-11-18 DIAGNOSIS — R21 Rash and other nonspecific skin eruption: Secondary | ICD-10-CM | POA: Insufficient documentation

## 2021-11-18 DIAGNOSIS — Z5189 Encounter for other specified aftercare: Secondary | ICD-10-CM | POA: Insufficient documentation

## 2021-11-18 DIAGNOSIS — Z8349 Family history of other endocrine, nutritional and metabolic diseases: Secondary | ICD-10-CM | POA: Insufficient documentation

## 2021-11-18 DIAGNOSIS — Z5181 Encounter for therapeutic drug level monitoring: Secondary | ICD-10-CM

## 2021-11-18 DIAGNOSIS — Z79620 Long term (current) use of immunosuppressive biologic: Secondary | ICD-10-CM | POA: Insufficient documentation

## 2021-11-18 DIAGNOSIS — D701 Agranulocytosis secondary to cancer chemotherapy: Secondary | ICD-10-CM | POA: Insufficient documentation

## 2021-11-18 DIAGNOSIS — L739 Follicular disorder, unspecified: Secondary | ICD-10-CM | POA: Insufficient documentation

## 2021-11-18 DIAGNOSIS — Z832 Family history of diseases of the blood and blood-forming organs and certain disorders involving the immune mechanism: Secondary | ICD-10-CM | POA: Insufficient documentation

## 2021-11-18 DIAGNOSIS — Z79899 Other long term (current) drug therapy: Secondary | ICD-10-CM | POA: Insufficient documentation

## 2021-11-18 DIAGNOSIS — Z809 Family history of malignant neoplasm, unspecified: Secondary | ICD-10-CM | POA: Insufficient documentation

## 2021-11-18 DIAGNOSIS — T451X5A Adverse effect of antineoplastic and immunosuppressive drugs, initial encounter: Secondary | ICD-10-CM | POA: Insufficient documentation

## 2021-11-18 DIAGNOSIS — B1910 Unspecified viral hepatitis B without hepatic coma: Secondary | ICD-10-CM | POA: Insufficient documentation

## 2021-11-18 LAB — COMPREHENSIVE METABOLIC PANEL
ALT: 19 U/L (ref 0–44)
AST: 27 U/L (ref 15–41)
Albumin: 3.8 g/dL (ref 3.5–5.0)
Alkaline Phosphatase: 85 U/L (ref 38–126)
Anion gap: 6 (ref 5–15)
BUN: 11 mg/dL (ref 8–23)
CO2: 25 mmol/L (ref 22–32)
Calcium: 8.9 mg/dL (ref 8.9–10.3)
Chloride: 108 mmol/L (ref 98–111)
Creatinine, Ser: 1.05 mg/dL (ref 0.61–1.24)
GFR, Estimated: >60 mL/min (ref >60)
Glucose, Bld: 121 mg/dL — ABNORMAL HIGH (ref 70–99)
Potassium: 3.6 mmol/L (ref 3.5–5.1)
Sodium: 139 mmol/L (ref 135–145)
Total Bilirubin: 0.4 mg/dL (ref 0.3–1.2)
Total Protein: 6.1 g/dL — ABNORMAL LOW (ref 6.5–8.1)

## 2021-11-18 LAB — CBC WITH DIFFERENTIAL/PLATELET
Abs Immature Granulocytes: 0.02 10*3/uL (ref 0.00–0.07)
Basophils Absolute: 0 10*3/uL (ref 0.0–0.1)
Basophils Relative: 1 %
Eosinophils Absolute: 0.1 10*3/uL (ref 0.0–0.5)
Eosinophils Relative: 4 %
HCT: 34.7 % — ABNORMAL LOW (ref 39.0–52.0)
Hemoglobin: 12 g/dL — ABNORMAL LOW (ref 13.0–17.0)
Immature Granulocytes: 1 %
Lymphocytes Relative: 20 %
Lymphs Abs: 0.6 10*3/uL — ABNORMAL LOW (ref 0.7–4.0)
MCH: 31.9 pg (ref 26.0–34.0)
MCHC: 34.6 g/dL (ref 30.0–36.0)
MCV: 92.3 fL (ref 80.0–100.0)
Monocytes Absolute: 0.6 10*3/uL (ref 0.1–1.0)
Monocytes Relative: 17 %
Neutro Abs: 1.8 10*3/uL (ref 1.7–7.7)
Neutrophils Relative %: 57 %
Platelets: 202 10*3/uL (ref 150–400)
RBC: 3.76 MIL/uL — ABNORMAL LOW (ref 4.22–5.81)
RDW: 14.8 % (ref 11.5–15.5)
WBC: 3.2 10*3/uL — ABNORMAL LOW (ref 4.0–10.5)
nRBC: 0 % (ref 0.0–0.2)

## 2021-11-18 LAB — LACTATE DEHYDROGENASE: LDH: 150 U/L (ref 98–192)

## 2021-11-18 MED ORDER — PALONOSETRON HCL INJECTION 0.25 MG/5ML
0.2500 mg | Freq: Once | INTRAVENOUS | Status: AC
  Administered 2021-11-18: 0.25 mg via INTRAVENOUS
  Filled 2021-11-18: qty 5

## 2021-11-18 MED ORDER — SODIUM CHLORIDE 0.9 % IV SOLN: 375.0000 mg/m2 | Freq: Once | INTRAVENOUS | Status: DC

## 2021-11-18 MED ORDER — HEPARIN SOD (PORK) LOCK FLUSH 100 UNIT/ML IV SOLN
500.0000 [IU] | Freq: Once | INTRAVENOUS | Status: AC | PRN
  Administered 2021-11-18: 500 [IU]
  Filled 2021-11-18: qty 5

## 2021-11-18 MED ORDER — SODIUM CHLORIDE 0.9 % IV SOLN
140.0000 mg | Freq: Once | INTRAVENOUS | Status: AC
  Administered 2021-11-18: 140 mg via INTRAVENOUS
  Filled 2021-11-18: qty 7

## 2021-11-18 MED ORDER — SODIUM CHLORIDE 0.9 % IV SOLN
Freq: Once | INTRAVENOUS | Status: AC
  Filled 2021-11-18: qty 250

## 2021-11-18 MED ORDER — SODIUM CHLORIDE 0.9% FLUSH
10.0000 mL | INTRAVENOUS | Status: DC | PRN
  Administered 2021-11-18: 10 mL via INTRAVENOUS
  Filled 2021-11-18: qty 10

## 2021-11-18 MED ORDER — DIPHENHYDRAMINE HCL 25 MG PO CAPS
50.0000 mg | ORAL_CAPSULE | Freq: Once | ORAL | Status: AC
  Administered 2021-11-18: 50 mg via ORAL
  Filled 2021-11-18: qty 2

## 2021-11-18 MED ORDER — SODIUM CHLORIDE 0.9 % IV SOLN
375.0000 mg/m2 | Freq: Once | INTRAVENOUS | Status: AC
  Administered 2021-11-18: 800 mg via INTRAVENOUS
  Filled 2021-11-18: qty 30

## 2021-11-18 MED ORDER — ACETAMINOPHEN 325 MG PO TABS
650.0000 mg | ORAL_TABLET | Freq: Once | ORAL | Status: AC
  Administered 2021-11-18: 650 mg via ORAL
  Filled 2021-11-18: qty 2

## 2021-11-18 MED ORDER — SODIUM CHLORIDE 0.9 % IV SOLN
10.0000 mg | Freq: Once | INTRAVENOUS | Status: AC
  Administered 2021-11-18: 10 mg via INTRAVENOUS
  Filled 2021-11-18: qty 10

## 2021-11-18 MED ORDER — SODIUM CHLORIDE 0.9 % IV SOLN
200.0000 mg | Freq: Once | INTRAVENOUS | Status: AC
  Administered 2021-11-18: 200 mg via INTRAVENOUS
  Filled 2021-11-18: qty 8

## 2021-11-18 MED FILL — Dexamethasone Sodium Phosphate Inj 100 MG/10ML: INTRAMUSCULAR | Qty: 1 | Status: AC

## 2021-11-18 NOTE — Progress Notes (Signed)
   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  This was followed by a PET CT scan which showed extensive FDG avid adenopathy in the neck chest abdomen and pelvis.  FDG avid splenic lesions.  Solitary intramuscular FDG avid lesion in the right biceps femoris muscle.   Supraclavicular excisional lymph node biopsy showed high-grade B-cell lymphoma germinal center type Ki-67 greater than 95%.  FISH testing was positive for Bcl-2 BCL6 and MYC consistent with triple hit lymphoma.  By NCCN IPI score would be 4 based on age and elevated LDH and stage IV (intramuscular biceps femoris lesion) which puts him in the high intermediate risk group. 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   Patient received RCHOP for cycle 1 with plans for DA REPOCH with cycle 2. IT MTX for CNS prophylaxis and he has received 3 cycles but declined further cycles.  MRI brain negative for lymphoma.  Chemotherapy was not being able to escalated upwards due to neutropenia which has persisted to 3 weeks and he has  been receiving treatment every 4 weeks   PET CT scan after 3 cycles of chemotherapy showedComplete or near complete metabolic response.  Residual disease in the anterior upper right mediastinum slightly greater than background mediastinal activity   Patient found to have a clinically palpable right supraclavicular lymph node which was subsequently biopsied and was consistent with previously diagnosed lymphoma.  PET CT scan showedNew right level 5 adenopathy in the lower neck with dominant lymph node 1.6 cm [place] 5.  No other findings of acute active lymphoma in chest abdomen pelvis and bones.  MRI brain negative for leptomeningeal disease.   Patient seen for second opinion by Dr. Dittus at [facility] for consideration of CAR-T cell therapy.  Patient did not wish to proceed with CAR-T cell treatment after reviewing risks versus benefits.  Moreover patient's health insurance will also not cover CAR-T cell therapy. Patient started on second line tafasitamab/Revlimid regimen in June 2023.  He is on Revlimid 15 mg 3 weeks on and 1 week off.  Disease progression after 3 cycles and patient was switched to Pola BR regimen    Interval history-patient is doing well and tolerating treatments well so far without any significant side effects.  Reports that his right supraclavicular lymphadenopathy has resolved  ECOG PS- 0 Pain scale- 0   Review of systems- Review of Systems  Constitutional:  Negative for chills, fever, malaise/fatigue and weight loss.  HENT:  Negative for congestion, ear discharge and nosebleeds.   Eyes:  Negative for blurred vision.  Respiratory:  Negative for cough, hemoptysis, sputum production, shortness of breath and wheezing.   Cardiovascular:  Negative for chest pain, palpitations, orthopnea   and claudication.  Gastrointestinal:  Negative for abdominal pain, blood in stool, constipation, diarrhea, heartburn, melena, nausea and vomiting.  Genitourinary:  Negative for dysuria, flank pain,  frequency, hematuria and urgency.  Musculoskeletal:  Negative for back pain, joint pain and myalgias.  Skin:  Negative for rash.  Neurological:  Negative for dizziness, tingling, focal weakness, seizures, weakness and headaches.  Endo/Heme/Allergies:  Does not bruise/bleed easily.  Psychiatric/Behavioral:  Negative for depression and suicidal ideas. The patient does not have insomnia.       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     Past Surgical History:  Procedure Laterality Date   BONE MARROW BIOPSY     COLONOSCOPY     EXCISION MASS NECK Right 06/12/2020   Procedure: EXCISION MASS NECK;  Surgeon: Jules Husbands, MD;  Location: [facility] ORS;  Service: General;  Laterality: Right;   HERNIA REPAIR Right    at age 24-RIH   2 [place] Right 06/24/2020   Procedure: INSERTION PORT-A-CATH;  Surgeon: Jules Husbands, MD;  Location: [facility] ORS;  Service: General;  Laterality: Right;    Social History   Socioeconomic History   Marital status: Single    Spouse name: Not on file   Number of children: Not on file   Years of education: Not on file   Highest education level: Not on file  Occupational History   Not on file  Tobacco Use   Smoking status: Former    Types: Cigarettes    Quit date: 06/13/2020    Years since quitting: 1.4   Smokeless tobacco: Never  Vaping Use   Vaping Use: Never used  Substance and Sexual Activity   Alcohol use: Not Currently   Drug use: Not Currently    Types: Marijuana   Sexual activity: Not Currently  Other Topics Concern   Not on file  Social History Narrative   Has daughter and grandson in the home   Social Determinants of Health   Financial Resource Strain: Not on file  Food Insecurity: Not on file  Transportation Needs: Not on file  Physical Activity: Not on file   Stress: Not on file  Social Connections: Not on file  Intimate Partner Violence: Not on file    Family History  Problem Relation Age of Onset   Anemia Mother    Hypertension Mother    Goiter Mother    Cancer Father    Cancer Sister    Multiple sclerosis Sister      Current Outpatient Medications:    allopurinol (ZYLOPRIM) 300 MG tablet, TAKE 1 TABLET BY MOUTH EVERY DAY, Disp: 30 tablet, Rfl: 1   aspirin EC 81 MG tablet, Take 1 tablet (81 mg total) by mouth daily. Swallow whole., Disp: 30 tablet, Rfl: 0   atenolol (TENORMIN) 25 MG tablet, Take 25 mg by mouth daily., Disp: , Rfl:    cholecalciferol (VITAMIN D3) 25 MCG (1000 UNIT) tablet, Take 1,000 Units by mouth daily., Disp: , Rfl:    lisinopril (ZESTRIL) 10 MG tablet, Take 10 mg by mouth daily., Disp: , Rfl:    Omega-3 Fatty Acids (FISH OIL ADULT GUMMIES PO), Take by mouth., Disp: , Rfl:    Tenofovir Alafenamide Fumarate (VEMLIDY) 25 MG TABS, Take 1 tablet (25 mg total) by mouth daily. Take  with food., Disp: 30 tablet, Rfl: 2   vitamin C (ASCORBIC ACID) 500 MG tablet, Take 500 mg by mouth daily., Disp: , Rfl:  No current facility-administered medications for this visit.  Facility-Administered Medications Ordered in Other Visits:    0.9 %  sodium chloride infusion, , Intravenous, Continuous, Sindy Guadeloupe, MD, Stopped at 09/09/20 1507   0.9 %  sodium chloride infusion, , Intravenous, Continuous, Cammie Sickle, MD, Stopped at 09/11/20 1502   0.9 %  sodium chloride infusion, , Intravenous, Continuous, Sindy Guadeloupe, MD, Stopped at 11/06/20 1424   heparin lock flush 100 unit/mL, 500 Units, Intravenous, Once, Sindy Guadeloupe, MD   methotrexate (PF) 12 mg in sodium chloride (PF) 0.9 % INTRATHECAL chemo injection, , Intrathecal, Once, Sindy Guadeloupe, MD   sodium chloride flush (NS) 0.9 % injection 10 mL, 10 mL, Intravenous, PRN, Sindy Guadeloupe, MD, 10 mL at 11/18/21 0910  Physical exam:  Vitals:   11/18/21 0926  BP: 119/70   Pulse: 69  Resp: 18  Temp: 98.2 F (36.8 C)  SpO2: 100%  Weight: 184 lb 9.6 oz (83.7 kg)   Physical Exam Constitutional:      General: He is not in acute distress. Cardiovascular:     Rate and Rhythm: Normal rate and regular rhythm.     Heart sounds: Normal heart sounds.  Pulmonary:     Effort: Pulmonary effort is normal.     Breath sounds: Normal breath sounds.  Abdominal:     General: Bowel sounds are normal.     Palpations: Abdomen is soft.  Lymphadenopathy:     Comments: No palpable right supraclavicular adenopathy  Skin:    General: Skin is warm and dry.  Neurological:     Mental Status: He is alert and oriented to person, place, and time.         Latest Ref Rng & Units 11/18/2021    8:58 AM  CMP  Glucose 70 - 99 mg/dL 121   BUN 8 - 23 mg/dL 11   Creatinine 0.61 - 1.24 mg/dL 1.05   Sodium 135 - 145 mmol/L 139   Potassium 3.5 - 5.1 mmol/L 3.6   Chloride 98 - 111 mmol/L 108   CO2 22 - 32 mmol/L 25   Calcium 8.9 - 10.3 mg/dL 8.9   Total Protein 6.5 - 8.1 g/dL 6.1   Total Bilirubin 0.3 - 1.2 mg/dL 0.4   Alkaline Phos 38 - 126 U/L 85   AST 15 - 41 U/L 27   ALT 0 - 44 U/L 19       Latest Ref Rng & Units 11/18/2021    8:58 AM  CBC  WBC 4.0 - 10.5 K/uL 3.2   Hemoglobin 13.0 - 17.0 g/dL 12.0   Hematocrit 39.0 - 52.0 % 34.7   Platelets 150 - 400 K/uL 202      Assessment and plan- Patient is a 61 y.o. 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  Day 2 of Bendamustine tomorrow.  Udenyca on day 3.  I will see him back in 3 weeks for cycle 3.  Plan to get a repeat PET CT scan after 3 cycles.  Clinically patient has responded well to treatment with resolution of right supraclavicular adenopathy.  Mild leukopenia: Secondary to chemotherapy continue to monitor  Hepatitis B core antibody  positive: Continue tenofovir  He will also need to continue acyclovir allopurinol and Bactrim prophylaxis and I will have him see Allison Leonhard from oral pharmacy tomorrow for medication reconciliation to make sure he is taking all the recommended medications   Visit Diagnosis 1. High grade B-cell lymphoma (HCC)   2. Encounter for antineoplastic chemotherapy   3. Encounter for monitoring rituximab therapy      Dr.  , MD, MPH [facility] at [facility] [phone] 11/18/2021 4:36 PM                

## 2021-11-18 NOTE — Patient Instructions (Signed)
[facility]  Discharge Instructions: Thank you for choosing [facility] to provide your oncology and hematology care.  If you have a lab appointment with the Cancer Center, please go directly to the Cancer Center and check in at the registration area.  Wear comfortable clothing and clothing appropriate for easy access to any Portacath or PICC line.   We strive to give you quality time with your provider. You may need to reschedule your appointment if you arrive late (15 or more minutes).  Arriving late affects you and other patients whose appointments are after yours.  Also, if you miss three or more appointments without notifying the office, you may be dismissed from the clinic at the provider's discretion.      For prescription refill requests, have your pharmacy contact our office and allow 72 hours for refills to be completed.       To help prevent nausea and vomiting after your treatment, we encourage you to take your nausea medication as directed.  BELOW ARE SYMPTOMS THAT SHOULD BE REPORTED IMMEDIATELY: *FEVER GREATER THAN 100.4 F (38 C) OR HIGHER *CHILLS OR SWEATING *NAUSEA AND VOMITING THAT IS NOT CONTROLLED WITH YOUR NAUSEA MEDICATION *UNUSUAL SHORTNESS OF BREATH *UNUSUAL BRUISING OR BLEEDING *URINARY PROBLEMS (pain or burning when urinating, or frequent urination) *BOWEL PROBLEMS (unusual diarrhea, constipation, pain near the anus) TENDERNESS IN MOUTH AND THROAT WITH OR WITHOUT PRESENCE OF ULCERS (sore throat, sores in mouth, or a toothache) UNUSUAL RASH, SWELLING OR PAIN  UNUSUAL VAGINAL DISCHARGE OR ITCHING   Items with * indicate a potential emergency and should be followed up as soon as possible or go to the Emergency Department if any problems should occur.  Please show the CHEMOTHERAPY ALERT CARD or IMMUNOTHERAPY ALERT CARD at check-in to the Emergency Department and triage nurse.  Should you have questions after your  visit or need to cancel or reschedule your appointment, please contact [facility]  [phone] and follow the prompts.  Office hours are 8:00 a.m. to 4:30 p.m. Monday - Friday. Please note that voicemails left after 4:00 p.m. may not be returned until the following business day.  We are closed weekends and major holidays. You have access to a nurse at all times for urgent questions. Please call the main number to the clinic [phone] and follow the prompts.  For any non-urgent questions, you may also contact your provider using MyChart. We now offer e-Visits for anyone 18 and older to request care online for non-urgent symptoms. For details visit [email or web address].   Also download the MyChart app! Go to the app store, search "MyChart", open the app, select [facility], and log in with your MyChart username and password.  Masks are optional in the cancer centers. If you would like for your care team to wear a mask while they are taking care of you, please let them know. For doctor visits, patients may have with them one support person who is at least 62 years old. At this time, visitors are not allowed in the infusion area.   

## 2021-11-18 NOTE — Progress Notes (Signed)
OFF PATHWAY REGIMEN - Lymphoma and CLL  No Change  Continue With Treatment as Ordered.  Original Decision Date/Time: 10/15/2021 17:25   [id number]:Polatuzumab 1.8 mg/kg IV D1 + Bendamustine 90 mg/m2 IV D1,2 + Rituximab 375 mg/m2 IV D1 q21 Days x 6 Cycles:   A cycle is every 21 days:     Rituximab-xxxx      Polatuzumab vedotin-piiq      Bendamustine   **Always confirm dose/schedule in your pharmacy ordering system**  Patient Characteristics: Double Hit Lymphoma, Second Line and Beyond Disease Type: Not Applicable Disease Type: Double Hit Lymphoma Disease Type: Not Applicable Line of therapy: Second Line and Beyond Intent of Therapy: Non-Curative / Palliative Intent, Discussed with Patient

## 2021-11-19 ENCOUNTER — Other Ambulatory Visit: Payer: Self-pay

## 2021-11-19 ENCOUNTER — Ambulatory Visit: Payer: Medicaid Other

## 2021-11-19 ENCOUNTER — Other Ambulatory Visit (HOSPITAL_COMMUNITY): Payer: Self-pay

## 2021-11-19 ENCOUNTER — Inpatient Hospital Stay: Payer: Self-pay

## 2021-11-19 ENCOUNTER — Inpatient Hospital Stay: Payer: Self-pay | Admitting: Pharmacist

## 2021-11-19 VITALS — BP 116/67 | HR 64 | Temp 98.0°F | Resp 16

## 2021-11-19 DIAGNOSIS — C8510 Unspecified B-cell lymphoma, unspecified site: Secondary | ICD-10-CM

## 2021-11-19 MED ORDER — SODIUM CHLORIDE 0.9 % IV SOLN
200.0000 mg | Freq: Once | INTRAVENOUS | Status: AC
  Administered 2021-11-19: 200 mg via INTRAVENOUS
  Filled 2021-11-19: qty 8

## 2021-11-19 MED ORDER — SODIUM CHLORIDE 0.9 % IV SOLN: 90.0000 mg/m2 | Freq: Once | INTRAVENOUS | Status: DC

## 2021-11-19 MED ORDER — SODIUM CHLORIDE 0.9 % IV SOLN
Freq: Once | INTRAVENOUS | Status: AC
  Filled 2021-11-19: qty 250

## 2021-11-19 MED ORDER — HEPARIN SOD (PORK) LOCK FLUSH 100 UNIT/ML IV SOLN
500.0000 [IU] | Freq: Once | INTRAVENOUS | Status: AC | PRN
  Administered 2021-11-19: 500 [IU]
  Filled 2021-11-19: qty 5

## 2021-11-19 MED ORDER — HEPARIN SOD (PORK) LOCK FLUSH 100 UNIT/ML IV SOLN
INTRAVENOUS | Status: AC
  Filled 2021-11-19: qty 5

## 2021-11-19 MED ORDER — SODIUM CHLORIDE 0.9 % IV SOLN
10.0000 mg | Freq: Once | INTRAVENOUS | Status: AC
  Administered 2021-11-19: 10 mg via INTRAVENOUS
  Filled 2021-11-19: qty 10

## 2021-11-19 NOTE — Patient Instructions (Signed)
[facility]  Discharge Instructions: Thank you for choosing [facility] to provide your oncology and hematology care.  If you have a lab appointment with the Cancer Center, please go directly to the Cancer Center and check in at the registration area.  Wear comfortable clothing and clothing appropriate for easy access to any Portacath or PICC line.   We strive to give you quality time with your provider. You may need to reschedule your appointment if you arrive late (15 or more minutes).  Arriving late affects you and other patients whose appointments are after yours.  Also, if you miss three or more appointments without notifying the office, you may be dismissed from the clinic at the provider's discretion.      For prescription refill requests, have your pharmacy contact our office and allow 72 hours for refills to be completed.    Today you received the following chemotherapy and/or immunotherapy agents Bendeka      To help prevent nausea and vomiting after your treatment, we encourage you to take your nausea medication as directed.  BELOW ARE SYMPTOMS THAT SHOULD BE REPORTED IMMEDIATELY: *FEVER GREATER THAN 100.4 F (38 C) OR HIGHER *CHILLS OR SWEATING *NAUSEA AND VOMITING THAT IS NOT CONTROLLED WITH YOUR NAUSEA MEDICATION *UNUSUAL SHORTNESS OF BREATH *UNUSUAL BRUISING OR BLEEDING *URINARY PROBLEMS (pain or burning when urinating, or frequent urination) *BOWEL PROBLEMS (unusual diarrhea, constipation, pain near the anus) TENDERNESS IN MOUTH AND THROAT WITH OR WITHOUT PRESENCE OF ULCERS (sore throat, sores in mouth, or a toothache) UNUSUAL RASH, SWELLING OR PAIN  UNUSUAL VAGINAL DISCHARGE OR ITCHING   Items with * indicate a potential emergency and should be followed up as soon as possible or go to the Emergency Department if any problems should occur.  Please show the CHEMOTHERAPY ALERT CARD or IMMUNOTHERAPY ALERT CARD at check-in to the  Emergency Department and triage nurse.  Should you have questions after your visit or need to cancel or reschedule your appointment, please contact [facility]  [phone] and follow the prompts.  Office hours are 8:00 a.m. to 4:30 p.m. Monday - Friday. Please note that voicemails left after 4:00 p.m. may not be returned until the following business day.  We are closed weekends and major holidays. You have access to a nurse at all times for urgent questions. Please call the main number to the clinic [phone] and follow the prompts.  For any non-urgent questions, you may also contact your provider using MyChart. We now offer e-Visits for anyone 18 and older to request care online for non-urgent symptoms. For details visit [email or web address].   Also download the MyChart app! Go to the app store, search "MyChart", open the app, select [facility], and log in with your MyChart username and password.  Masks are optional in the cancer centers. If you would like for your care team to wear a mask while they are taking care of you, please let them know. For doctor visits, patients may have with them one support person who is at least 62 years old. At this time, visitors are not allowed in the infusion area.   

## 2021-11-19 NOTE — Progress Notes (Unsigned)
[facility]  Telephone:([phone] [phone]  Patient Care Team: [name], MD as PCP - General (Internal Medicine) Sindy Guadeloupe, MD as Consulting Physician (Hematology and Oncology) Jules Husbands, MD as Consulting Physician (General Surgery)   Name of the patient: Christopher Mills  [id number]  [birth year]   Date of visit: 11/19/21  HPI: Patient is a 61 y.o. male with relapsed/refractory triple hit diffuse large B-cell lymphoma.   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    HEMATOLOGY/ONCOLOGY HISTORY:  Oncology History  High grade B-cell lymphoma ([place])  06/18/2020 Initial Diagnosis   High grade B-cell lymphoma ([place])   06/18/2020 Cancer Staging   Staging form: Hodgkin and Non-Hodgkin Lymphoma, AJCC 8th Edition - Clinical stage from 06/18/2020: Stage IV (Diffuse large B-cell lymphoma) - Signed by Sindy Guadeloupe, MD on 06/18/2020 Stage prefix: Initial diagnosis   06/21/2020 - 06/21/2020 Chemotherapy         07/03/2020 - 07/08/2020 Chemotherapy         08/12/2020 - 12/05/2020 Chemotherapy   Patient is on Treatment Plan :  [facility] q21d     08/17/2021 - 10/12/2021 Chemotherapy   Patient is on Treatment Plan : NON-HODGKIN'S LYMPHOMA DLBCL RELAPSED/REFRACTORY Tafasitamab-cxix + Lenalidomide q28d / Tafasitamab-cxix Maintenance q28d     10/29/2021 - 11/02/2021 Chemotherapy   Patient is on Treatment Plan : NON-HODGKINS LYMPHOMA RELAPSED/ REFRACTORY DLBCL Polatuzumab + Bendamustine + Rituximab q21d x 6 cycles     11/12/2021 - 11/12/2021 Chemotherapy   Patient is on Treatment Plan : NON-HODGKINS LYMPHOMA RELAPSED/ REFRACTORY DLBCL Polatuzumab D1 + Bendamustine D1,2 + Rituximab D1 q21d x 6 cycles     11/18/2021 -  Chemotherapy   Patient  is on Treatment Plan : NON-HODGKINS LYMPHOMA RELAPSED/ REFRACTORY DLBCL Polatuzumab D1 + Bendamustine D1,2 + Rituximab D1 q21d x 6 cycles       ALLERGIES:  has No Known Allergies.  MEDICATIONS:  Current Outpatient Medications  Medication Sig Dispense Refill   allopurinol (ZYLOPRIM) 300 MG tablet TAKE 1 TABLET BY MOUTH EVERY DAY 30 tablet 1   aspirin EC 81 MG tablet Take 1 tablet (81 mg total) by mouth daily. Swallow whole. 30 tablet 0   atenolol (TENORMIN) 25 MG tablet Take 25 mg by mouth daily.     cholecalciferol (VITAMIN D3) 25 MCG (1000 UNIT) tablet Take 1,000 Units by mouth daily.     lisinopril (ZESTRIL) 10 MG tablet Take 10 mg by mouth daily.     Omega-3 Fatty Acids (FISH OIL ADULT GUMMIES PO) Take by mouth.     Tenofovir Alafenamide Fumarate (VEMLIDY) 25 MG TABS Take 1 tablet (25 mg total) by mouth daily. Take with food. 30 tablet 2   vitamin C (ASCORBIC ACID) 500 MG tablet Take 500 mg by mouth daily.     No current facility-administered medications for this visit.   Facility-Administered Medications Ordered in Other Visits  Medication Dose Route Frequency Provider Last Rate Last Admin   0.9 %  sodium chloride infusion   Intravenous Continuous Sindy Guadeloupe, MD   Stopped at 09/09/20 1507   0.9 %  sodium chloride infusion   Intravenous Continuous Cammie Sickle, MD   Stopped at 09/11/20 1502   0.9 %  sodium chloride infusion  Intravenous Continuous Sindy Guadeloupe, MD   Stopped at 11/06/20 1424   heparin lock flush 100 UNIT/ML injection            heparin lock flush 100 unit/mL  500 Units Intravenous Once Sindy Guadeloupe, MD       methotrexate (PF) 12 mg in sodium chloride (PF) 0.9 % INTRATHECAL chemo injection   Intrathecal Once Sindy Guadeloupe, MD       sodium chloride flush (NS) 0.9 % injection 10 mL  10 mL Intravenous PRN Sindy Guadeloupe, MD   10 mL at 11/18/21 0910    VITAL SIGNS: There were no vitals taken for this visit. There were no vitals filed for this visit.   Estimated body mass index is 24.36 kg/m as calculated from the following:   Height as of 10/28/21: '6\' 1"'$  (1.854 m).   Weight as of 11/18/21: 83.7 kg (184 lb 9.6 oz).  LABS: CBC:    Component Value Date/Time   WBC 3.2 (L) 11/18/2021 0858   HGB 12.0 (L) 11/18/2021 0858   HGB 13.1 07/03/2020 0935   HCT 34.7 (L) 11/18/2021 0858   HCT 40.1 02/19/2013 1716   PLT 202 11/18/2021 0858   PLT 274 02/19/2013 1716   MCV 92.3 11/18/2021 0858   MCV 86 02/19/2013 1716   NEUTROABS 1.8 11/18/2021 0858   LYMPHSABS 0.6 (L) 11/18/2021 0858   MONOABS 0.6 11/18/2021 0858   EOSABS 0.1 11/18/2021 0858   BASOSABS 0.0 11/18/2021 0858   Comprehensive Metabolic Panel:    Component Value Date/Time   NA 139 11/18/2021 0858   NA 135 (L) 02/19/2013 1716   K 3.6 11/18/2021 0858   K 3.5 02/19/2013 1716   CL 108 11/18/2021 0858   CL 102 02/19/2013 1716   CO2 25 11/18/2021 0858   CO2 27 02/19/2013 1716   BUN 11 11/18/2021 0858   BUN 16 02/19/2013 1716   CREATININE 1.05 11/18/2021 0858   CREATININE 1.12 02/19/2013 1716   GLUCOSE 121 (H) 11/18/2021 0858   GLUCOSE 94 02/19/2013 1716   CALCIUM 8.9 11/18/2021 0858   CALCIUM 9.6 02/19/2013 1716   AST 27 11/18/2021 0858   AST 33 02/19/2013 1716   ALT 19 11/18/2021 0858   ALT 41 02/19/2013 1716   ALKPHOS 85 11/18/2021 0858   ALKPHOS 79 02/19/2013 1716   BILITOT 0.4 11/18/2021 0858   BILITOT 0.4 02/19/2013 1716   PROT 6.1 (L) 11/18/2021 0858   PROT 7.9 02/19/2013 1716   ALBUMIN 3.8 11/18/2021 0858   ALBUMIN 4.2 02/19/2013 1716     Present during today's visit: patient only seen in infusion  Assessment and Plan: Patient brought in all of his medications today for me to review. He is taking all of his medications as prescribed Will f/u with patient after today's appt on the plan for Bactrim. He has not been taking his medication currently   Patient expressed understanding and was in agreement with this plan. He also understands that He can call clinic  at any time with any questions, concerns, or complaints.   Thank you for allowing me to participate in the care of this very pleasant patient.   Time Total: 15 mins  Visit consisted of counseling and education on dealing with issues of symptom management in the setting of serious and potentially life-threatening illness.Greater than 50%  of this time was spent counseling and coordinating care related to the above assessment and plan.  Signed by: Darl Pikes, PharmD, BCPS,  BCOP, CPP Hematology/Oncology Clinical Pharmacist Practitioner [place]/DB/AP [facility] [phone]  11/19/2021 5:38 PM

## 2021-11-20 ENCOUNTER — Ambulatory Visit: Payer: Medicaid Other

## 2021-11-20 ENCOUNTER — Inpatient Hospital Stay: Payer: Self-pay

## 2021-11-20 DIAGNOSIS — C8510 Unspecified B-cell lymphoma, unspecified site: Secondary | ICD-10-CM

## 2021-11-20 MED ORDER — PEGFILGRASTIM-CBQV 6 MG/0.6ML ~~LOC~~ SOSY
6.0000 mg | PREFILLED_SYRINGE | Freq: Once | SUBCUTANEOUS | Status: AC
  Administered 2021-11-20: 6 mg via SUBCUTANEOUS
  Filled 2021-11-20: qty 0.6

## 2021-11-23 ENCOUNTER — Other Ambulatory Visit (HOSPITAL_COMMUNITY): Payer: Self-pay

## 2021-11-23 ENCOUNTER — Ambulatory Visit: Payer: Medicaid Other

## 2021-11-24 MED ORDER — ACYCLOVIR 400 MG PO TABS: 400.0000 mg | ORAL_TABLET | Freq: Two times a day (BID) | ORAL | 2 refills | Status: DC

## 2021-11-24 NOTE — Progress Notes (Signed)
Oral Chemotherapy Pharmacist Encounter   Spoke with Dr. Janese Banks, per study protocol for [id number] (A Study of Polatuzumab Vedotin [phone]) in Combination With Rituximab or Obinutuzumab Plus Bendamustine in Participants With Relapsed or Refractory Follicular or Diffuse Large B-Cell Lymphoma) patients received anti-pneumocystis and antiviral prophylaxis.   Prescription for acyclovir sent to local pharmacy for Mr. Zeidman. He already have an active prescription with refills at his local pharmacy for sulfamethoxazole-trimethoprim so patient was instructed to call for a refill.   Darl Pikes, PharmD, BCPS, BCOP, CPP Hematology/Oncology Clinical Pharmacist [place]/DB/AP [facility] [phone]  11/24/2021 2:02 PM

## 2021-11-25 ENCOUNTER — Other Ambulatory Visit: Payer: Self-pay

## 2021-12-04 ENCOUNTER — Other Ambulatory Visit (HOSPITAL_COMMUNITY): Payer: Self-pay

## 2021-12-08 ENCOUNTER — Ambulatory Visit
Admission: RE | Admit: 2021-12-08 | Discharge: 2021-12-08 | Disposition: A | Discharge status: Home / Self Care | Payer: Medicaid Other | Source: Ambulatory Visit | Attending: Oncology | Admitting: Oncology

## 2021-12-08 DIAGNOSIS — C8510 Unspecified B-cell lymphoma, unspecified site: Secondary | ICD-10-CM | POA: Insufficient documentation

## 2021-12-08 LAB — GLUCOSE, CAPILLARY: Glucose-Capillary: 107 mg/dL — ABNORMAL HIGH (ref 70–99)

## 2021-12-08 MED ORDER — FLUDEOXYGLUCOSE F - 18 (FDG) INJECTION
9.5000 | Freq: Once | INTRAVENOUS | Status: AC | PRN
  Administered 2021-12-08: 10.37 via INTRAVENOUS

## 2021-12-08 MED FILL — Dexamethasone Sodium Phosphate Inj 100 MG/10ML: INTRAMUSCULAR | Qty: 1 | Status: AC

## 2021-12-09 ENCOUNTER — Inpatient Hospital Stay (HOSPITAL_BASED_OUTPATIENT_CLINIC_OR_DEPARTMENT_OTHER): Payer: Self-pay | Admitting: Oncology

## 2021-12-09 ENCOUNTER — Inpatient Hospital Stay: Payer: Self-pay

## 2021-12-09 VITALS — BP 132/86 | HR 70 | Temp 98.0°F | Resp 16 | Ht 73.0 in | Wt 180.9 lb

## 2021-12-09 DIAGNOSIS — C8510 Unspecified B-cell lymphoma, unspecified site: Secondary | ICD-10-CM

## 2021-12-09 DIAGNOSIS — Z5111 Encounter for antineoplastic chemotherapy: Secondary | ICD-10-CM

## 2021-12-09 DIAGNOSIS — Z5181 Encounter for therapeutic drug level monitoring: Secondary | ICD-10-CM

## 2021-12-09 DIAGNOSIS — Z79620 Long term (current) use of immunosuppressive biologic: Secondary | ICD-10-CM

## 2021-12-09 DIAGNOSIS — L270 Generalized skin eruption due to drugs and medicaments taken internally: Secondary | ICD-10-CM

## 2021-12-09 LAB — COMPREHENSIVE METABOLIC PANEL
ALT: 22 U/L (ref 0–44)
AST: 32 U/L (ref 15–41)
Albumin: 3.8 g/dL (ref 3.5–5.0)
Alkaline Phosphatase: 89 U/L (ref 38–126)
Anion gap: 5 (ref 5–15)
BUN: 10 mg/dL (ref 8–23)
CO2: 26 mmol/L (ref 22–32)
Calcium: 9.2 mg/dL (ref 8.9–10.3)
Chloride: 108 mmol/L (ref 98–111)
Creatinine, Ser: 1 mg/dL (ref 0.61–1.24)
GFR, Estimated: >60 mL/min (ref >60)
Glucose, Bld: 150 mg/dL — ABNORMAL HIGH (ref 70–99)
Potassium: 3.7 mmol/L (ref 3.5–5.1)
Sodium: 139 mmol/L (ref 135–145)
Total Bilirubin: 0.7 mg/dL (ref 0.3–1.2)
Total Protein: 6.6 g/dL (ref 6.5–8.1)

## 2021-12-09 LAB — CBC WITH DIFFERENTIAL/PLATELET
Abs Immature Granulocytes: 0.01 10*3/uL (ref 0.00–0.07)
Basophils Absolute: 0 10*3/uL (ref 0.0–0.1)
Basophils Relative: 1 %
Eosinophils Absolute: 0.2 10*3/uL (ref 0.0–0.5)
Eosinophils Relative: 7 %
HCT: 36.3 % — ABNORMAL LOW (ref 39.0–52.0)
Hemoglobin: 12.6 g/dL — ABNORMAL LOW (ref 13.0–17.0)
Immature Granulocytes: 0 %
Lymphocytes Relative: 47 %
Lymphs Abs: 1.5 10*3/uL (ref 0.7–4.0)
MCH: 32.2 pg (ref 26.0–34.0)
MCHC: 34.7 g/dL (ref 30.0–36.0)
MCV: 92.8 fL (ref 80.0–100.0)
Monocytes Absolute: 0.4 10*3/uL (ref 0.1–1.0)
Monocytes Relative: 12 %
Neutro Abs: 1.1 10*3/uL — ABNORMAL LOW (ref 1.7–7.7)
Neutrophils Relative %: 33 %
Platelets: 230 10*3/uL (ref 150–400)
RBC: 3.91 MIL/uL — ABNORMAL LOW (ref 4.22–5.81)
RDW: 15.9 % — ABNORMAL HIGH (ref 11.5–15.5)
WBC: 3.3 10*3/uL — ABNORMAL LOW (ref 4.0–10.5)
nRBC: 0 % (ref 0.0–0.2)

## 2021-12-09 MED ORDER — SODIUM CHLORIDE 0.9 % IV SOLN
Freq: Once | INTRAVENOUS | Status: AC
  Filled 2021-12-09: qty 250

## 2021-12-09 MED ORDER — PALONOSETRON HCL INJECTION 0.25 MG/5ML
0.2500 mg | Freq: Once | INTRAVENOUS | Status: AC
  Administered 2021-12-09: 0.25 mg via INTRAVENOUS
  Filled 2021-12-09: qty 5

## 2021-12-09 MED ORDER — SODIUM CHLORIDE 0.9 % IV SOLN
10.0000 mg | Freq: Once | INTRAVENOUS | Status: AC
  Administered 2021-12-09: 10 mg via INTRAVENOUS
  Filled 2021-12-09: qty 10

## 2021-12-09 MED ORDER — ACETAMINOPHEN 325 MG PO TABS
650.0000 mg | ORAL_TABLET | Freq: Once | ORAL | Status: AC
  Administered 2021-12-09: 650 mg via ORAL
  Filled 2021-12-09: qty 2

## 2021-12-09 MED ORDER — SODIUM CHLORIDE 0.9 % IV SOLN
90.0000 mg/m2 | Freq: Once | INTRAVENOUS | Status: AC
  Administered 2021-12-09: 175 mg via INTRAVENOUS
  Filled 2021-12-09: qty 7

## 2021-12-09 MED ORDER — SODIUM CHLORIDE 0.9% FLUSH
10.0000 mL | INTRAVENOUS | Status: DC | PRN
  Administered 2021-12-09: 10 mL
  Filled 2021-12-09: qty 10

## 2021-12-09 MED ORDER — SODIUM CHLORIDE 0.9 % IV SOLN
375.0000 mg/m2 | Freq: Once | INTRAVENOUS | Status: AC
  Administered 2021-12-09: 800 mg via INTRAVENOUS
  Filled 2021-12-09: qty 50

## 2021-12-09 MED ORDER — HEPARIN SOD (PORK) LOCK FLUSH 100 UNIT/ML IV SOLN
500.0000 [IU] | Freq: Once | INTRAVENOUS | Status: AC | PRN
  Administered 2021-12-09: 500 [IU]
  Filled 2021-12-09: qty 5

## 2021-12-09 MED ORDER — SODIUM CHLORIDE 0.9 % IV SOLN
140.0000 mg | Freq: Once | INTRAVENOUS | Status: AC
  Administered 2021-12-09: 140 mg via INTRAVENOUS
  Filled 2021-12-09: qty 7

## 2021-12-09 MED ORDER — DIPHENHYDRAMINE HCL 25 MG PO CAPS
50.0000 mg | ORAL_CAPSULE | Freq: Once | ORAL | Status: AC
  Administered 2021-12-09: 50 mg via ORAL
  Filled 2021-12-09: qty 2

## 2021-12-09 MED FILL — Dexamethasone Sodium Phosphate Inj 100 MG/10ML: INTRAMUSCULAR | Qty: 1 | Status: AC

## 2021-12-09 NOTE — Progress Notes (Signed)
Pt has itching and sores from scratching on arms and back

## 2021-12-09 NOTE — Progress Notes (Signed)
   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  This was followed by a PET CT scan which showed extensive FDG avid adenopathy in the neck chest abdomen and pelvis.  FDG avid splenic lesions.  Solitary intramuscular FDG avid lesion in the right biceps femoris muscle.   Supraclavicular excisional lymph node biopsy showed high-grade B-cell lymphoma germinal center type Ki-67 greater than 95%.  FISH testing was positive for Bcl-2 BCL6 and MYC consistent with triple hit lymphoma.  By NCCN IPI score would be 4 based on age and elevated LDH and stage IV (intramuscular biceps femoris lesion) which puts him in the high intermediate risk group. 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   Patient received RCHOP for cycle 1 with plans for DA REPOCH with cycle 2. IT MTX for CNS prophylaxis and he has received 3 cycles but declined further cycles.  MRI brain negative for lymphoma.  Chemotherapy was not being able to escalated upwards due to neutropenia which has persisted to 3 weeks and he has  been receiving treatment every 4 weeks   PET CT scan after 3 cycles of chemotherapy showedComplete or near complete metabolic response.  Residual disease in the anterior upper right mediastinum slightly greater than background mediastinal activity   Patient found to have a clinically palpable right supraclavicular lymph node which was subsequently biopsied and was consistent with previously diagnosed lymphoma.  PET CT scan showedNew right level 5 adenopathy in the lower neck with dominant lymph node 1.6 cm [place] 5.  No other findings of acute active lymphoma in chest abdomen pelvis and bones.  MRI brain negative for leptomeningeal disease.   Patient seen for second opinion by Dr. Dittus at [facility] for consideration of CAR-T cell therapy.  Patient did not wish to proceed with CAR-T cell treatment after reviewing risks versus benefits.  Moreover patient's health insurance will also not cover CAR-T cell therapy. Patient started on second line tafasitamab/Revlimid regimen in June 2023.  He is on Revlimid 15 mg 3 weeks on and 1 week off.  Disease progression after 3 cycles and patient was switched to Pola BR regimen      Interval history-patient was started on acyclovir and Bactrim prophylaxis with last treatment.  He reports developing skin rash all over his body which is gradually getting better but new areas keep coming up  ECOG PS- 0 Pain scale- 0   Review of systems- Review of Systems  Constitutional:  Negative for chills, fever, malaise/fatigue and weight loss.  HENT:  Negative for congestion, ear discharge and nosebleeds.   Eyes:  Negative for blurred vision.  Respiratory:  Negative for cough, hemoptysis, sputum production, shortness of breath   and wheezing.   Cardiovascular:  Negative for chest pain, palpitations, orthopnea and claudication.  Gastrointestinal:  Negative for abdominal pain, blood in stool, constipation, diarrhea, heartburn, melena, nausea and vomiting.  Genitourinary:   Negative for dysuria, flank pain, frequency, hematuria and urgency.  Musculoskeletal:  Negative for back pain, joint pain and myalgias.  Skin:  Positive for rash.  Neurological:  Negative for dizziness, tingling, focal weakness, seizures, weakness and headaches.  Endo/Heme/Allergies:  Does not bruise/bleed easily.  Psychiatric/Behavioral:  Negative for depression and suicidal ideas. The patient does not have insomnia.       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     Past Surgical History:  Procedure Laterality Date   BONE MARROW BIOPSY     COLONOSCOPY     EXCISION MASS NECK Right 06/12/2020   Procedure: EXCISION MASS NECK;  Surgeon: Jules Husbands, MD;  Location: [facility] ORS;  Service: General;  Laterality: Right;   HERNIA REPAIR Right    at age 68-RIH   [address] [place] Right 06/24/2020   Procedure: INSERTION PORT-A-CATH;  Surgeon: Jules Husbands, MD;  Location: [facility] ORS;  Service: General;  Laterality: Right;    Social History   Socioeconomic History   Marital status: Single    Spouse name: Not on file   Number of children: Not on file   Years of education: Not on file   Highest education level: Not on file  Occupational History   Not on file  Tobacco Use   Smoking status: Former    Types: Cigarettes    Quit date: 06/13/2020    Years since quitting: 1.4   Smokeless tobacco: Never  Vaping Use   Vaping Use: Never used  Substance and Sexual Activity   Alcohol use: Not Currently   Drug use: Not Currently    Types: Marijuana   Sexual activity: Not Currently  Other Topics Concern   Not on file  Social History Narrative   Has daughter and grandson in the home   Social Determinants of Health   Financial Resource Strain: Not on file  Food Insecurity: Not on file  Transportation Needs: Not on file   Physical Activity: Not on file  Stress: Not on file  Social Connections: Not on file  Intimate Partner Violence: Not on file    Family History  Problem Relation Age of Onset   Anemia Mother    Hypertension Mother    Goiter Mother    Cancer Father    Cancer Sister    Multiple sclerosis Sister      Current Outpatient Medications:    acyclovir (ZOVIRAX) 400 MG tablet, Take 1 tablet (400 mg total) by mouth 2 (two) times daily., Disp: 60 tablet, Rfl: 2   allopurinol (ZYLOPRIM) 300 MG tablet, TAKE 1 TABLET BY MOUTH EVERY DAY, Disp: 30 tablet, Rfl: 1   aspirin EC 81 MG tablet, Take 1 tablet (81 mg total) by mouth daily. Swallow whole., Disp: 30 tablet, Rfl: 0   atenolol (TENORMIN) 25 MG tablet, Take 25 mg by mouth daily., Disp: , Rfl:    cholecalciferol (VITAMIN D3) 25 MCG (1000 UNIT) tablet, Take 1,000 Units by mouth daily., Disp: , Rfl:    lisinopril (ZESTRIL) 10 MG tablet, Take 10 mg by mouth daily., Disp: , Rfl:  Omega-3 Fatty Acids (FISH OIL ADULT GUMMIES PO), Take by mouth., Disp: , Rfl:    sulfamethoxazole-trimethoprim (BACTRIM DS) 800-160 MG tablet, Take 1 tablet by mouth 3 (three) times a week., Disp: , Rfl:    Tenofovir Alafenamide Fumarate (VEMLIDY) 25 MG TABS, Take 1 tablet (25 mg total) by mouth daily. Take with food., Disp: 30 tablet, Rfl: 2   vitamin C (ASCORBIC ACID) 500 MG tablet, Take 500 mg by mouth daily., Disp: , Rfl:  No current facility-administered medications for this visit.  Facility-Administered Medications Ordered in Other Visits:    0.9 %  sodium chloride infusion, , Intravenous, Continuous, ,  C, MD, Stopped at 09/09/20 1507   0.9 %  sodium chloride infusion, , Intravenous, Continuous, Brahmanday, Govinda R, MD, Stopped at 09/11/20 1502   0.9 %  sodium chloride infusion, , Intravenous, Continuous, ,  C, MD, Stopped at 11/06/20 1424   bendamustine (BENDEKA) 175 mg in sodium chloride 0.9 % 50 mL (3.0702 mg/mL) chemo infusion, 90 mg/m2  (Treatment Plan Recorded), Intravenous, Once, ,  C, MD   heparin lock flush 100 unit/mL, 500 Units, Intravenous, Once, ,  C, MD   heparin lock flush 100 unit/mL, 500 Units, Intracatheter, Once PRN, ,  C, MD   methotrexate (PF) 12 mg in sodium chloride (PF) 0.9 % INTRATHECAL chemo injection, , Intrathecal, Once, ,  C, MD   polatuzumab vedotin-piiq (POLIVY) 140 mg in sodium chloride 0.9 % 50 mL (2.4561 mg/mL) chemo infusion, 140 mg, Intravenous, Once, ,  C, MD   sodium chloride flush (NS) 0.9 % injection 10 mL, 10 mL, Intravenous, PRN, ,  C, MD, 10 mL at 11/18/21 0910   sodium chloride flush (NS) 0.9 % injection 10 mL, 10 mL, Intracatheter, PRN, ,  C, MD, 10 mL at 12/09/21 0843  Physical exam:  Vitals:   12/09/21 0938  BP: 132/86  Pulse: 70  Resp: 16  Temp: 98 F (36.7 C)  TempSrc: Oral  Weight: 180 lb 14.4 oz (82.1 kg)  Height: 6' 1" (1.854 m)   Physical Exam Constitutional:      General: He is not in acute distress. Cardiovascular:     Rate and Rhythm: Normal rate and regular rhythm.     Heart sounds: Normal heart sounds.  Pulmonary:     Effort: Pulmonary effort is normal.     Breath sounds: Normal breath sounds.  Abdominal:     General: Bowel sounds are normal.     Palpations: Abdomen is soft.  Skin:    Comments: Scattered areas of follicular rash is seen mostly over the trunk and back with some areas over the forearm as well  Neurological:     Mental Status: He is alert and oriented to person, place, and time.         Latest Ref Rng & Units 12/09/2021    8:35 AM  CMP  Glucose 70 - 99 mg/dL 150   BUN 8 - 23 mg/dL 10   Creatinine 0.61 - 1.24 mg/dL 1.00   Sodium 135 - 145 mmol/L 139   Potassium 3.5 - 5.1 mmol/L 3.7   Chloride 98 - 111 mmol/L 108   CO2 22 - 32 mmol/L 26   Calcium 8.9 - 10.3 mg/dL 9.2   Total Protein 6.5 - 8.1 g/dL 6.6   Total Bilirubin 0.3 - 1.2 mg/dL 0.7   Alkaline Phos 38 - 126  U/L 89   AST 15 - 41 U/L 32   ALT 0 - 44   U/L 22       Latest Ref Rng & Units 12/09/2021    8:35 AM  CBC  WBC 4.0 - 10.5 K/uL 3.3   Hemoglobin 13.0 - 17.0 g/dL 12.6   Hematocrit 39.0 - 52.0 % 36.3   Platelets 150 - 400 K/uL 230     Assessment and plan- Patient is a 61 y.o. 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  He is here for on treatment assessment prior to cycle 4 of Pola BR chemotherapy  Counts okay to proceed with cycle 4 of Pola BR chemotherapy today with Udenyca on day 3.  He did have a PET scan yesterday and official read is pending.  On review of imaging there appears to be resolution of previously noted right supraclavicular adenopathy and no new areas of adenopathy seen.  Plan is to continue [place] BR until progression or toxicity.  Mild leukopenia/neutropenia: He will receive Udenyca on day 3  Skin rash: Does not appear as a classic erythematous drug rash.  There are scattered areas of folliculitis noted.  Unclear if it is secondary to acyclovir or Bactrim which is the only new medication started since the last treatment.  Have asked him to hold off on taking his Bactrim for this cycle and we will see how his rash does with next treatment   Visit Diagnosis 1. Drug-induced skin rash   2. High grade B-cell lymphoma ([place])   3. Encounter for antineoplastic chemotherapy   4. Encounter for monitoring rituximab therapy      Dr. Randa Evens, MD, MPH [facility] at [facility] [phone] 12/09/2021 12:22 PM

## 2021-12-09 NOTE — Patient Instructions (Signed)
[facility]CER [facility] AT [place]  Discharge Instructions: Thank you for choosing [facility] to provide your oncology and hematology care.  If you have a lab appointment with the [facility], please go directly to the [facility] and check in at the registration area.  Wear comfortable clothing and clothing appropriate for easy access to any Portacath or PICC line.   We strive to give you quality time with your provider. You may need to reschedule your appointment if you arrive late (15 or more minutes).  Arriving late affects you and other patients whose appointments are after yours.  Also, if you miss three or more appointments without notifying the office, you may be dismissed from the clinic at the provider's discretion.      For prescription refill requests, have your pharmacy contact our office and allow 72 hours for refills to be completed.    Today you received the following chemotherapy and/or immunotherapy agents Rituxin, [place]man Comp      To help prevent nausea and vomiting after your treatment, we encourage you to take your nausea medication as directed.  BELOW ARE SYMPTOMS THAT SHOULD BE REPORTED IMMEDIATELY: *FEVER GREATER THAN 100.4 F (38 C) OR HIGHER *CHILLS OR SWEATING *NAUSEA AND VOMITING THAT IS NOT CONTROLLED WITH YOUR NAUSEA MEDICATION *UNUSUAL SHORTNESS OF BREATH *UNUSUAL BRUISING OR BLEEDING *URINARY PROBLEMS (pain or burning when urinating, or frequent urination) *BOWEL PROBLEMS (unusual diarrhea, constipation, pain near the anus) TENDERNESS IN MOUTH AND THROAT WITH OR WITHOUT PRESENCE OF ULCERS (sore throat, sores in mouth, or a toothache) UNUSUAL RASH, SWELLING OR PAIN  UNUSUAL VAGINAL DISCHARGE OR ITCHING   Items with * indicate a potential emergency and should be followed up as soon as possible or go to the Emergency Department if any problems should occur.  Please show the CHEMOTHERAPY ALERT CARD or IMMUNOTHERAPY ALERT CARD  at check-in to the Emergency Department and triage nurse.  Should you have questions after your visit or need to cancel or reschedule your appointment, please contact [facility] CANCER [place] AT [place]  [phone] and follow the prompts.  Office hours are 8:00 a.m. to 4:30 p.m. Monday - Friday. Please note that voicemails left after 4:00 p.m. may not be returned until the following business day.  We are closed weekends and major holidays. You have access to a nurse at all times for urgent questions. Please call the main number to the clinic [phone] and follow the prompts.  For any non-urgent questions, you may also contact your provider using MyChart. We now offer e-Visits for anyone 61 and older to request care online for non-urgent symptoms. For details visit [email or web address].   Also download the MyChart app! Go to the app store, search "MyChart", open the app, select [facility], and log in with your MyChart username and password.  Masks are optional in the cancer centers. If you would like for your care team to wear a mask while they are taking care of you, please let them know. For doctor visits, patients may have with them one support person who is at least 62 years old. At this time, visitors are not allowed in the infusion area.

## 2021-12-10 ENCOUNTER — Inpatient Hospital Stay: Payer: Self-pay

## 2021-12-10 VITALS — BP 129/68 | HR 80 | Temp 97.7°F | Resp 20

## 2021-12-10 DIAGNOSIS — C8510 Unspecified B-cell lymphoma, unspecified site: Secondary | ICD-10-CM

## 2021-12-10 MED ORDER — SODIUM CHLORIDE 0.9 % IV SOLN
Freq: Once | INTRAVENOUS | Status: AC
  Filled 2021-12-10: qty 250

## 2021-12-10 MED ORDER — SODIUM CHLORIDE 0.9 % IV SOLN
10.0000 mg | Freq: Once | INTRAVENOUS | Status: AC
  Administered 2021-12-10: 10 mg via INTRAVENOUS
  Filled 2021-12-10: qty 10

## 2021-12-10 MED ORDER — HEPARIN SOD (PORK) LOCK FLUSH 100 UNIT/ML IV SOLN
500.0000 [IU] | Freq: Once | INTRAVENOUS | Status: AC | PRN
  Administered 2021-12-10: 500 [IU]
  Filled 2021-12-10: qty 5

## 2021-12-10 MED ORDER — SODIUM CHLORIDE 0.9 % IV SOLN
90.0000 mg/m2 | Freq: Once | INTRAVENOUS | Status: AC
  Administered 2021-12-10: 175 mg via INTRAVENOUS
  Filled 2021-12-10: qty 7

## 2021-12-10 MED ORDER — SODIUM CHLORIDE 0.9% FLUSH
10.0000 mL | INTRAVENOUS | Status: DC | PRN
  Administered 2021-12-10: 10 mL
  Filled 2021-12-10: qty 10

## 2021-12-11 ENCOUNTER — Inpatient Hospital Stay: Payer: Self-pay

## 2021-12-11 DIAGNOSIS — C8510 Unspecified B-cell lymphoma, unspecified site: Secondary | ICD-10-CM

## 2021-12-11 MED ORDER — PEGFILGRASTIM-CBQV 6 MG/0.6ML ~~LOC~~ SOSY
6.0000 mg | PREFILLED_SYRINGE | Freq: Once | SUBCUTANEOUS | Status: AC
  Administered 2021-12-11: 6 mg via SUBCUTANEOUS
  Filled 2021-12-11: qty 0.6

## 2021-12-14 ENCOUNTER — Telehealth: Payer: Self-pay | Admitting: *Deleted

## 2021-12-14 NOTE — Telephone Encounter (Signed)
Call returned to Pharmacy and informed that patient is no longer on this medicine.

## 2021-12-14 NOTE — Telephone Encounter (Signed)
He does not get monjuvi anymore. Please let pharmacy know

## 2021-12-14 NOTE — Telephone Encounter (Signed)
Monjuvi

## 2021-12-14 NOTE — Telephone Encounter (Signed)
[facility] called needing to know if patient is still on treatment or can they discontinue prescription Please return call [phone]

## 2021-12-14 NOTE — Telephone Encounter (Signed)
What prescription?

## 2021-12-17 ENCOUNTER — Other Ambulatory Visit (HOSPITAL_COMMUNITY): Payer: Self-pay

## 2021-12-21 ENCOUNTER — Other Ambulatory Visit (HOSPITAL_COMMUNITY): Payer: Self-pay

## 2021-12-29 MED FILL — Dexamethasone Sodium Phosphate Inj 100 MG/10ML: INTRAMUSCULAR | Qty: 1 | Status: AC

## 2021-12-30 ENCOUNTER — Other Ambulatory Visit: Payer: Self-pay | Admitting: *Deleted

## 2021-12-30 ENCOUNTER — Inpatient Hospital Stay: Payer: Self-pay

## 2021-12-30 ENCOUNTER — Inpatient Hospital Stay: Payer: Self-pay | Attending: Oncology

## 2021-12-30 ENCOUNTER — Inpatient Hospital Stay (HOSPITAL_BASED_OUTPATIENT_CLINIC_OR_DEPARTMENT_OTHER): Payer: Self-pay | Admitting: Oncology

## 2021-12-30 ENCOUNTER — Encounter: Payer: Self-pay | Admitting: Oncology

## 2021-12-30 VITALS — BP 140/93 | HR 70 | Temp 98.2°F | Resp 16 | Wt 180.2 lb

## 2021-12-30 VITALS — BP 147/92 | HR 68 | Temp 96.8°F | Resp 17

## 2021-12-30 DIAGNOSIS — Z5189 Encounter for other specified aftercare: Secondary | ICD-10-CM | POA: Insufficient documentation

## 2021-12-30 DIAGNOSIS — C8510 Unspecified B-cell lymphoma, unspecified site: Secondary | ICD-10-CM

## 2021-12-30 DIAGNOSIS — Z79624 Long term (current) use of inhibitors of nucleotide synthesis: Secondary | ICD-10-CM | POA: Insufficient documentation

## 2021-12-30 DIAGNOSIS — Z8349 Family history of other endocrine, nutritional and metabolic diseases: Secondary | ICD-10-CM | POA: Insufficient documentation

## 2021-12-30 DIAGNOSIS — Z79899 Other long term (current) drug therapy: Secondary | ICD-10-CM | POA: Insufficient documentation

## 2021-12-30 DIAGNOSIS — Z5111 Encounter for antineoplastic chemotherapy: Secondary | ICD-10-CM

## 2021-12-30 DIAGNOSIS — R21 Rash and other nonspecific skin eruption: Secondary | ICD-10-CM | POA: Insufficient documentation

## 2021-12-30 DIAGNOSIS — Z8269 Family history of other diseases of the musculoskeletal system and connective tissue: Secondary | ICD-10-CM | POA: Insufficient documentation

## 2021-12-30 DIAGNOSIS — D701 Agranulocytosis secondary to cancer chemotherapy: Secondary | ICD-10-CM

## 2021-12-30 DIAGNOSIS — Z7961 Long term (current) use of immunomodulator: Secondary | ICD-10-CM | POA: Insufficient documentation

## 2021-12-30 DIAGNOSIS — Z87891 Personal history of nicotine dependence: Secondary | ICD-10-CM | POA: Insufficient documentation

## 2021-12-30 DIAGNOSIS — Z809 Family history of malignant neoplasm, unspecified: Secondary | ICD-10-CM | POA: Insufficient documentation

## 2021-12-30 DIAGNOSIS — D709 Neutropenia, unspecified: Secondary | ICD-10-CM | POA: Insufficient documentation

## 2021-12-30 DIAGNOSIS — T451X5A Adverse effect of antineoplastic and immunosuppressive drugs, initial encounter: Secondary | ICD-10-CM

## 2021-12-30 DIAGNOSIS — I700 Atherosclerosis of aorta: Secondary | ICD-10-CM | POA: Insufficient documentation

## 2021-12-30 DIAGNOSIS — C8331 Diffuse large B-cell lymphoma, lymph nodes of head, face, and neck: Secondary | ICD-10-CM | POA: Insufficient documentation

## 2021-12-30 DIAGNOSIS — Z8249 Family history of ischemic heart disease and other diseases of the circulatory system: Secondary | ICD-10-CM | POA: Insufficient documentation

## 2021-12-30 DIAGNOSIS — Z5112 Encounter for antineoplastic immunotherapy: Secondary | ICD-10-CM | POA: Insufficient documentation

## 2021-12-30 DIAGNOSIS — Z832 Family history of diseases of the blood and blood-forming organs and certain disorders involving the immune mechanism: Secondary | ICD-10-CM | POA: Insufficient documentation

## 2021-12-30 DIAGNOSIS — R590 Localized enlarged lymph nodes: Secondary | ICD-10-CM | POA: Insufficient documentation

## 2021-12-30 LAB — CBC WITH DIFFERENTIAL/PLATELET
Abs Immature Granulocytes: 0.01 10*3/uL (ref 0.00–0.07)
Basophils Absolute: 0 10*3/uL (ref 0.0–0.1)
Basophils Relative: 1 %
Eosinophils Absolute: 0.1 10*3/uL (ref 0.0–0.5)
Eosinophils Relative: 4 %
HCT: 36.5 % — ABNORMAL LOW (ref 39.0–52.0)
Hemoglobin: 12.7 g/dL — ABNORMAL LOW (ref 13.0–17.0)
Immature Granulocytes: 0 %
Lymphocytes Relative: 41 %
Lymphs Abs: 1.2 10*3/uL (ref 0.7–4.0)
MCH: 32.6 pg (ref 26.0–34.0)
MCHC: 34.8 g/dL (ref 30.0–36.0)
MCV: 93.8 fL (ref 80.0–100.0)
Monocytes Absolute: 0.6 10*3/uL (ref 0.1–1.0)
Monocytes Relative: 21 %
Neutro Abs: 1 10*3/uL — ABNORMAL LOW (ref 1.7–7.7)
Neutrophils Relative %: 33 %
Platelets: 227 10*3/uL (ref 150–400)
RBC: 3.89 MIL/uL — ABNORMAL LOW (ref 4.22–5.81)
RDW: 15.2 % (ref 11.5–15.5)
WBC: 3 10*3/uL — ABNORMAL LOW (ref 4.0–10.5)
nRBC: 0 % (ref 0.0–0.2)

## 2021-12-30 LAB — COMPREHENSIVE METABOLIC PANEL
ALT: 18 U/L (ref 0–44)
AST: 33 U/L (ref 15–41)
Albumin: 4 g/dL (ref 3.5–5.0)
Alkaline Phosphatase: 94 U/L (ref 38–126)
Anion gap: 6 (ref 5–15)
BUN: 15 mg/dL (ref 8–23)
CO2: 25 mmol/L (ref 22–32)
Calcium: 9.2 mg/dL (ref 8.9–10.3)
Chloride: 109 mmol/L (ref 98–111)
Creatinine, Ser: 1.05 mg/dL (ref 0.61–1.24)
GFR, Estimated: >60 mL/min (ref >60)
Glucose, Bld: 138 mg/dL — ABNORMAL HIGH (ref 70–99)
Potassium: 3.8 mmol/L (ref 3.5–5.1)
Sodium: 140 mmol/L (ref 135–145)
Total Bilirubin: 0.6 mg/dL (ref 0.3–1.2)
Total Protein: 6.6 g/dL (ref 6.5–8.1)

## 2021-12-30 MED ORDER — TRIAMCINOLONE ACETONIDE 0.5 % EX CREA: 1.0000 | TOPICAL_CREAM | Freq: Three times a day (TID) | CUTANEOUS | 0 refills | Status: DC

## 2021-12-30 MED ORDER — DIPHENHYDRAMINE HCL 25 MG PO CAPS
50.0000 mg | ORAL_CAPSULE | Freq: Once | ORAL | Status: AC
  Administered 2021-12-30: 50 mg via ORAL
  Filled 2021-12-30: qty 2

## 2021-12-30 MED ORDER — PALONOSETRON HCL INJECTION 0.25 MG/5ML
0.2500 mg | Freq: Once | INTRAVENOUS | Status: AC
  Administered 2021-12-30: 0.25 mg via INTRAVENOUS
  Filled 2021-12-30: qty 5

## 2021-12-30 MED ORDER — SODIUM CHLORIDE 0.9 % IV SOLN
375.0000 mg/m2 | Freq: Once | INTRAVENOUS | Status: AC
  Administered 2021-12-30: 800 mg via INTRAVENOUS
  Filled 2021-12-30: qty 50

## 2021-12-30 MED ORDER — SODIUM CHLORIDE 0.9 % IV SOLN
Freq: Once | INTRAVENOUS | Status: AC
  Filled 2021-12-30: qty 250

## 2021-12-30 MED ORDER — SODIUM CHLORIDE 0.9 % IV SOLN
90.0000 mg/m2 | Freq: Once | INTRAVENOUS | Status: AC
  Administered 2021-12-30: 175 mg via INTRAVENOUS
  Filled 2021-12-30: qty 7

## 2021-12-30 MED ORDER — HEPARIN SOD (PORK) LOCK FLUSH 100 UNIT/ML IV SOLN
500.0000 [IU] | Freq: Once | INTRAVENOUS | Status: AC | PRN
  Filled 2021-12-30: qty 5

## 2021-12-30 MED ORDER — SODIUM CHLORIDE 0.9 % IV SOLN
140.0000 mg | Freq: Once | INTRAVENOUS | Status: AC
  Administered 2021-12-30: 140 mg via INTRAVENOUS
  Filled 2021-12-30: qty 7

## 2021-12-30 MED ORDER — SODIUM CHLORIDE 0.9 % IV SOLN
10.0000 mg | Freq: Once | INTRAVENOUS | Status: AC
  Administered 2021-12-30: 10 mg via INTRAVENOUS
  Filled 2021-12-30: qty 10

## 2021-12-30 MED ORDER — HEPARIN SOD (PORK) LOCK FLUSH 100 UNIT/ML IV SOLN
INTRAVENOUS | Status: AC
  Administered 2021-12-30: 500 [IU]
  Filled 2021-12-30: qty 5

## 2021-12-30 MED ORDER — ACETAMINOPHEN 325 MG PO TABS
650.0000 mg | ORAL_TABLET | Freq: Once | ORAL | Status: AC
  Administered 2021-12-30: 650 mg via ORAL
  Filled 2021-12-30: qty 2

## 2021-12-30 MED FILL — Dexamethasone Sodium Phosphate Inj 100 MG/10ML: INTRAMUSCULAR | Qty: 1 | Status: AC

## 2021-12-30 NOTE — Progress Notes (Signed)
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  This was followed by a PET CT scan which showed extensive FDG avid adenopathy in the neck chest abdomen and pelvis.  FDG avid splenic lesions.  Solitary intramuscular FDG avid lesion in the right biceps femoris muscle.   Supraclavicular excisional lymph node biopsy showed high-grade B-cell lymphoma germinal center type Ki-67 greater than 95%.  FISH testing was positive for Bcl-2 BCL6 and MYC consistent with triple hit lymphoma.  By NCCN IPI score would be 4 based on age and elevated LDH and stage IV (intramuscular biceps femoris lesion) which puts him in the high intermediate risk group. 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   Patient received RCHOP for cycle 1 with plans for [facility] with cycle 2. IT MTX for CNS prophylaxis and he has received 3 cycles but declined further cycles.  MRI brain negative for lymphoma.  Chemotherapy was not being able to escalated upwards due to neutropenia which has persisted to 3 weeks and he has been receiving treatment  every 4 weeks   PET CT scan after 3 cycles of chemotherapy showedComplete or near complete metabolic response.  Residual disease in the anterior upper right mediastinum slightly greater than background mediastinal activity   Patient found to have a clinically palpable right supraclavicular lymph node which was subsequently biopsied and was consistent with previously diagnosed lymphoma.  PET CT scan showedNew right level 5 adenopathy in the lower neck with dominant lymph node 1.6 cm Deauville 5.  No other findings of acute active lymphoma in chest abdomen pelvis and bones.  MRI brain negative for leptomeningeal disease.   Patient seen for second opinion by Dr. Lonia Blood at [facility] for consideration of CAR-T cell therapy.  Patient did not wish to proceed with CAR-T cell treatment after reviewing risks versus benefits.  Moreover patient's health insurance will also not cover CAR-T cell therapy. Patient started on second line tafasitamab/Revlimid regimen in June 2023.  He is on Revlimid 15 mg 3 weeks on and 1 week off.  Disease progression after 3 cycles and patient was switched to [facility] BR regimen      Interval history-patient is tolerating treatments well other than development of folliculitis kind of rash mainly on his trunk and back.  He has been using over-the-counter emollient creams.  Rash has not been worse as compared to last time.  We did stop his Bactrim as we are not exactly sure as to what caused the rash.  ECOG PS- 0 Pain scale- 0   Review of systems- Review of Systems  Constitutional:  Negative for chills, fever, malaise/fatigue and weight loss.  HENT:  Negative for congestion,  ear discharge and nosebleeds.   Eyes:  Negative for blurred vision.  Respiratory:  Negative for cough, hemoptysis, sputum production, shortness of breath and wheezing.   Cardiovascular:  Negative for chest pain, palpitations, orthopnea and claudication.  Gastrointestinal:  Negative for abdominal pain, blood in stool,  constipation, diarrhea, heartburn, melena, nausea and vomiting.  Genitourinary:  Negative for dysuria, flank pain, frequency, hematuria and urgency.  Musculoskeletal:  Negative for back pain, joint pain and myalgias.  Skin:  Negative for rash.  Neurological:  Negative for dizziness, tingling, focal weakness, seizures, weakness and headaches.  Endo/Heme/Allergies:  Does not bruise/bleed easily.  Psychiatric/Behavioral:  Negative for depression and suicidal ideas. The patient does not have insomnia.       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     Past Surgical History:  Procedure Laterality Date   BONE MARROW BIOPSY     COLONOSCOPY     EXCISION MASS NECK Right 06/12/2020   Procedure: EXCISION MASS NECK;  Surgeon: Jules Husbands, MD;  Location: [facility] ORS;  Service: General;  Laterality: Right;   HERNIA REPAIR Right    at age 18-RIH   60 [place] Right 06/24/2020   Procedure: INSERTION PORT-A-CATH;  Surgeon: Jules Husbands, MD;  Location: [facility] ORS;  Service: General;  Laterality: Right;    Social History   Socioeconomic History   Marital status: Single    Spouse name: Not on file   Number of children: Not on file   Years of education: Not on file   Highest education level: Not on file  Occupational History   Not on file  Tobacco Use   Smoking status: Former    Types: Cigarettes    Quit date: 06/13/2020    Years since quitting: 1.5   Smokeless tobacco: Never  Vaping Use   Vaping Use: Never used  Substance and Sexual Activity   Alcohol use: Not Currently   Drug use: Not Currently    Types: Marijuana   Sexual activity: Not Currently  Other Topics Concern   Not on file  Social History Narrative   Has daughter and grandson in the home   Social Determinants of Health   Financial Resource Strain: Not  on file  Food Insecurity: Not on file  Transportation Needs: Not on file  Physical Activity: Not on file  Stress: Not on file  Social Connections: Not on file  Intimate Partner Violence: Not on file    Family History  Problem Relation Age of Onset   Anemia Mother    Hypertension Mother    Goiter Mother    Cancer Father    Cancer Sister    Multiple sclerosis Sister      Current Outpatient Medications:    acyclovir (ZOVIRAX) 400 MG tablet, Take 1 tablet (400 mg total) by mouth 2 (two) times daily., Disp: 60 tablet, Rfl: 2   allopurinol (ZYLOPRIM) 300 MG tablet, TAKE 1 TABLET BY MOUTH EVERY DAY, Disp: 30 tablet, Rfl: 1   aspirin EC 81 MG tablet, Take 1 tablet (81 mg total) by mouth daily. Swallow whole., Disp: 30 tablet, Rfl: 0   atenolol (TENORMIN) 25 MG tablet, Take 25 mg by mouth daily., Disp: , Rfl:    cholecalciferol (VITAMIN D3) 25 MCG (1000 UNIT) tablet, Take 1,000 Units  by mouth daily., Disp: , Rfl:    lisinopril (ZESTRIL) 10 MG tablet, Take 10 mg by mouth daily., Disp: , Rfl:    Omega-3 Fatty Acids (FISH OIL ADULT GUMMIES PO), Take by mouth., Disp: , Rfl:    Tenofovir Alafenamide Fumarate (VEMLIDY) 25 MG TABS, Take 1 tablet (25 mg total) by mouth daily. Take with food., Disp: 30 tablet, Rfl: 2   vitamin C (ASCORBIC ACID) 500 MG tablet, Take 500 mg by mouth daily., Disp: , Rfl:    sulfamethoxazole-trimethoprim (BACTRIM DS) 800-160 MG tablet, Take 1 tablet by mouth 3 (three) times a week. (Patient not taking: Reported on 12/30/2021), Disp: , Rfl:  No current facility-administered medications for this visit.  Facility-Administered Medications Ordered in Other Visits:    0.9 %  sodium chloride infusion, , Intravenous, Continuous, Sindy Guadeloupe, MD, Stopped at 09/09/20 1507   0.9 %  sodium chloride infusion, , Intravenous, Continuous, Cammie Sickle, MD, Stopped at 09/11/20 1502   0.9 %  sodium chloride infusion, , Intravenous, Continuous, Sindy Guadeloupe, MD, Stopped at  11/06/20 1424   acetaminophen (TYLENOL) tablet 650 mg, 650 mg, Oral, Once, Sindy Guadeloupe, MD   bendamustine [facility]) 175 mg in sodium chloride 0.9 % 50 mL (3.0702 mg/mL) chemo infusion, 90 mg/m2 (Treatment Plan Recorded), Intravenous, Once, Sindy Guadeloupe, MD   dexamethasone (DECADRON) 10 mg in sodium chloride 0.9 % 50 mL IVPB, 10 mg, Intravenous, Once, Sindy Guadeloupe, MD   diphenhydrAMINE (BENADRYL) capsule 50 mg, 50 mg, Oral, Once, Sindy Guadeloupe, MD   heparin lock flush 100 unit/mL, 500 Units, Intravenous, Once, Sindy Guadeloupe, MD   heparin lock flush 100 unit/mL, 500 Units, Intracatheter, Once PRN, Sindy Guadeloupe, MD   methotrexate (PF) 12 mg in sodium chloride (PF) 0.9 % INTRATHECAL chemo injection, , Intrathecal, Once, Sindy Guadeloupe, MD   palonosetron (ALOXI) injection 0.25 mg, 0.25 mg, Intravenous, Once, Sindy Guadeloupe, MD   polatuzumab vedotin-piiq (POLIVY) 150 mg in sodium chloride 0.9 % 100 mL (1.3953 mg/mL) chemo infusion, 1.8 mg/kg (Treatment Plan Recorded), Intravenous, Once, Sindy Guadeloupe, MD   riTUXimab (RITUXAN) 800 mg in sodium chloride 0.9 % 170 mL infusion, 375 mg/m2 (Treatment Plan Recorded), Intravenous, Once, Sindy Guadeloupe, MD   sodium chloride flush (NS) 0.9 % injection 10 mL, 10 mL, Intravenous, PRN, Sindy Guadeloupe, MD, 10 mL at 11/18/21 0910   sodium chloride flush (NS) 0.9 % injection 10 mL, 10 mL, Intracatheter, PRN, Sindy Guadeloupe, MD, 10 mL at 12/09/21 0843  Physical exam:  Vitals:   12/30/21 0851  BP: (!) 140/93  Pulse: 70  Resp: 16  Temp: 98.2 F (36.8 C)  SpO2: 100%  Weight: 180 lb 3.2 oz (81.7 kg)   Physical Exam Cardiovascular:     Rate and Rhythm: Normal rate and regular rhythm.     Heart sounds: Normal heart sounds.  Pulmonary:     Effort: Pulmonary effort is normal.     Breath sounds: Normal breath sounds.  Abdominal:     General: Bowel sounds are normal.     Palpations: Abdomen is soft.  Skin:    Comments: Few areas of healed folliculitis  seen over the trunk and back  Neurological:     Mental Status: He is alert and oriented to person, place, and time.         Latest Ref Rng & Units 12/30/2021    8:34 AM  CMP  Glucose 70 - 99  mg/dL 138   BUN 8 - 23 mg/dL 15   Creatinine 0.61 - 1.24 mg/dL 1.05   Sodium 135 - 145 mmol/L 140   Potassium 3.5 - 5.1 mmol/L 3.8   Chloride 98 - 111 mmol/L 109   CO2 22 - 32 mmol/L 25   Calcium 8.9 - 10.3 mg/dL 9.2   Total Protein 6.5 - 8.1 g/dL 6.6   Total Bilirubin 0.3 - 1.2 mg/dL 0.6   Alkaline Phos 38 - 126 U/L 94   AST 15 - 41 U/L 33   ALT 0 - 44 U/L 18       Latest Ref Rng & Units 12/30/2021    8:34 AM  CBC  WBC 4.0 - 10.5 K/uL 3.0   Hemoglobin 13.0 - 17.0 g/dL 12.7   Hematocrit 39.0 - 52.0 % 36.5   Platelets 150 - 400 K/uL 227     No images are attached to the encounter.  NM PET Image Restag (PS) Skull Base To Thigh  Result Date: 12/09/2021 CLINICAL DATA:  Subsequent treatment strategy for B-cell lymphoma. EXAM: NUCLEAR MEDICINE PET SKULL BASE TO THIGH TECHNIQUE: 10.37 mCi F-18 FDG was injected intravenously. Full-ring PET imaging was performed from the skull base to thigh after the radiotracer. CT data was obtained and used for attenuation correction and anatomic localization. Fasting blood glucose: 107 mg/dl COMPARISON:  Multiple priors including PET-CT October 14, 2021 FINDINGS: Mediastinal blood pool activity: SUV max 1.56 Liver activity: SUV max 2.59 NECK: Decreased size and metabolic activity in the right supraclavicular/cervical lymph nodes. Index lymph nodes are as follows: -right supraclavicular lymph node now measures 16 x 12 mm on image 55/2 with a max SUV of 2.3 previously measuring 4.0 x 3.0 cm with a max SUV of 14.9. -Adjacent lymph node now measures 6 x 4 mm on image 55/2 with a max SUV of 1.4 previously measuring 2.6 x 1.9 cm with a max SUV of 12.3 -inferior to the above lymph nodes within the posterior supraclavicular region there has been resolution of 1 of the  enlarged lymph nodes with the other remaining node now measuring 4 mm in short axis on image 62/2 without significant abnormal FDG avidity previously measuring 1.2 cm with a max SUV of 9.41. No new hypermetabolic cervical adenopathy. Incidental CT findings: None. CHEST: No hypermetabolic thoracic adenopathy. No hypermetabolic pulmonary nodules or masses. Incidental CT findings: Right chest Port-A-Cath with tip at the superior cavoatrial junction. Aortic atherosclerosis. ABDOMEN/PELVIS: No abnormal hypermetabolic activity within the liver, pancreas, adrenal glands, or spleen. No hypermetabolic lymph nodes in the abdomen or pelvis. Incidental CT findings: No splenomegaly.  Aortic atherosclerosis. SKELETON: No focal hypermetabolic activity to suggest skeletal metastasis. Incidental CT findings: None. IMPRESSION: 1. Significant interval decrease in size and FDG avidity in the right supraclavicular/cervical lymph nodes, consistent with treatment response. (Deauville 3) 2. No new or progressive hypermetabolic activity in the neck, chest, abdomen or pelvis. 3. No splenomegaly or evidence of osseous lymphomatous involvement 4.  Aortic Atherosclerosis (ICD10-I70.0). Electronically Signed   By: Dahlia Bailiff M.D.   On: 12/09/2021 13:44     Assessment and plan- Patient is a 61 y.o. 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  He is here for on treatment assessment prior to cycle 4 of polatuzumab Bendamustine Rituxan chemotherapy  Counts okay to proceed with cycle 4 of polatuzumab Bendamustine Rituxan chemotherapy today.  He will receive day 2 of Bendamustine tomorrow.  He receives [place] on day 3  White cell count is 3 today with an [place] of 1.  Since we are giving him the Udenyca on day 3 I am proceeding with chemotherapy today without any dose adjustments.  Plan is to complete 6 cycles followed by repeat PET scan and surveillance  Skin rash: Appears to be folliculitis type of  rash.  I am giving him a topical steroid to see if it improves especially given that he has a lot of itching associated with it.  There are a few scattered spots noted on his trunk.  No new lesions.  He can also use over-the-counter as needed Benadryl for itching.    Visit Diagnosis 1. High grade B-cell lymphoma ([place])   2. Encounter for antineoplastic chemotherapy      Dr. Randa Evens, MD, MPH [facility] at [facility] [phone] 12/30/2021 9:12 AM

## 2021-12-30 NOTE — Patient Instructions (Signed)
[facility] AT [place]  Discharge Instructions: Thank you for choosing [facility] to provide your oncology and hematology care.  If you have a lab appointment with the [facility], please go directly to the [facility] and check in at the registration area.  Wear comfortable clothing and clothing appropriate for easy access to any Portacath or PICC line.   We strive to give you quality time with your provider. You may need to reschedule your appointment if you arrive late (15 or more minutes).  Arriving late affects you and other patients whose appointments are after yours.  Also, if you miss three or more appointments without notifying the office, you may be dismissed from the clinic at the provider's discretion.      For prescription refill requests, have your pharmacy contact our office and allow 72 hours for refills to be completed.    Today you received the following chemotherapy and/or immunotherapy agents Polatuzumab, Bendamustine and Rituximab       To help prevent nausea and vomiting after your treatment, we encourage you to take your nausea medication as directed.  BELOW ARE SYMPTOMS THAT SHOULD BE REPORTED IMMEDIATELY: *FEVER GREATER THAN 100.4 F (38 C) OR HIGHER *CHILLS OR SWEATING *NAUSEA AND VOMITING THAT IS NOT CONTROLLED WITH YOUR NAUSEA MEDICATION *UNUSUAL SHORTNESS OF BREATH *UNUSUAL BRUISING OR BLEEDING *URINARY PROBLEMS (pain or burning when urinating, or frequent urination) *BOWEL PROBLEMS (unusual diarrhea, constipation, pain near the anus) TENDERNESS IN MOUTH AND THROAT WITH OR WITHOUT PRESENCE OF ULCERS (sore throat, sores in mouth, or a toothache) UNUSUAL RASH, SWELLING OR PAIN  UNUSUAL VAGINAL DISCHARGE OR ITCHING   Items with * indicate a potential emergency and should be followed up as soon as possible or go to the Emergency Department if any problems should occur.  Please show the CHEMOTHERAPY ALERT CARD or  IMMUNOTHERAPY ALERT CARD at check-in to the Emergency Department and triage nurse.  Should you have questions after your visit or need to cancel or reschedule your appointment, please contact [facility] [place] AT [place]  [phone] and follow the prompts.  Office hours are 8:00 a.m. to 4:30 p.m. Monday - Friday. Please note that voicemails left after 4:00 p.m. may not be returned until the following business day.  We are closed weekends and major holidays. You have access to a nurse at all times for urgent questions. Please call the main number to the clinic [phone] and follow the prompts.  For any non-urgent questions, you may also contact your provider using MyChart. We now offer e-Visits for anyone 57 and older to request care online for non-urgent symptoms. For details visit [email or web address].   Also download the MyChart app! Go to the app store, search "MyChart", open the app, select [place], and log in with your MyChart username and password.  Masks are optional in the cancer centers. If you would like for your care team to wear a mask while they are taking care of you, please let them know. For doctor visits, patients may have with them one support person who is at least 62 years old. At this time, visitors are not allowed in the infusion area.

## 2021-12-31 ENCOUNTER — Inpatient Hospital Stay: Payer: Self-pay

## 2021-12-31 ENCOUNTER — Other Ambulatory Visit: Payer: Self-pay

## 2021-12-31 VITALS — BP 120/81 | HR 78 | Temp 98.9°F | Resp 20

## 2021-12-31 DIAGNOSIS — C8510 Unspecified B-cell lymphoma, unspecified site: Secondary | ICD-10-CM

## 2021-12-31 MED ORDER — HEPARIN SOD (PORK) LOCK FLUSH 100 UNIT/ML IV SOLN
500.0000 [IU] | Freq: Once | INTRAVENOUS | Status: AC | PRN
  Administered 2021-12-31: 500 [IU]
  Filled 2021-12-31: qty 5

## 2021-12-31 MED ORDER — SODIUM CHLORIDE 0.9 % IV SOLN
Freq: Once | INTRAVENOUS | Status: AC
  Filled 2021-12-31: qty 250

## 2021-12-31 MED ORDER — SODIUM CHLORIDE 0.9 % IV SOLN
10.0000 mg | Freq: Once | INTRAVENOUS | Status: AC
  Administered 2021-12-31: 10 mg via INTRAVENOUS
  Filled 2021-12-31: qty 10

## 2021-12-31 MED ORDER — SODIUM CHLORIDE 0.9% FLUSH
10.0000 mL | INTRAVENOUS | Status: DC | PRN
  Administered 2021-12-31: 10 mL
  Filled 2021-12-31: qty 10

## 2021-12-31 MED ORDER — SODIUM CHLORIDE 0.9 % IV SOLN
90.0000 mg/m2 | Freq: Once | INTRAVENOUS | Status: AC
  Administered 2021-12-31: 175 mg via INTRAVENOUS
  Filled 2021-12-31: qty 7

## 2021-12-31 NOTE — Patient Instructions (Signed)
[facility] AT [place]  Discharge Instructions: Thank you for choosing [facility] to provide your oncology and hematology care.  If you have a lab appointment with the Cancer Center, please go directly to the Cancer Center and check in at the registration area.  Wear comfortable clothing and clothing appropriate for easy access to any Portacath or PICC line.   We strive to give you quality time with your provider. You may need to reschedule your appointment if you arrive late (15 or more minutes).  Arriving late affects you and other patients whose appointments are after yours.  Also, if you miss three or more appointments without notifying the office, you may be dismissed from the clinic at the provider's discretion.      For prescription refill requests, have your pharmacy contact our office and allow 72 hours for refills to be completed.    Today you received the following chemotherapy and/or immunotherapy agents Bendeka      To help prevent nausea and vomiting after your treatment, we encourage you to take your nausea medication as directed.  BELOW ARE SYMPTOMS THAT SHOULD BE REPORTED IMMEDIATELY: *FEVER GREATER THAN 100.4 F (38 C) OR HIGHER *CHILLS OR SWEATING *NAUSEA AND VOMITING THAT IS NOT CONTROLLED WITH YOUR NAUSEA MEDICATION *UNUSUAL SHORTNESS OF BREATH *UNUSUAL BRUISING OR BLEEDING *URINARY PROBLEMS (pain or burning when urinating, or frequent urination) *BOWEL PROBLEMS (unusual diarrhea, constipation, pain near the anus) TENDERNESS IN MOUTH AND THROAT WITH OR WITHOUT PRESENCE OF ULCERS (sore throat, sores in mouth, or a toothache) UNUSUAL RASH, SWELLING OR PAIN  UNUSUAL VAGINAL DISCHARGE OR ITCHING   Items with * indicate a potential emergency and should be followed up as soon as possible or go to the Emergency Department if any problems should occur.  Please show the CHEMOTHERAPY ALERT CARD or IMMUNOTHERAPY ALERT CARD at check-in to the  Emergency Department and triage nurse.  Should you have questions after your visit or need to cancel or reschedule your appointment, please contact [facility] AT [place]  [phone] and follow the prompts.  Office hours are 8:00 a.m. to 4:30 p.m. Monday - Friday. Please note that voicemails left after 4:00 p.m. may not be returned until the following business day.  We are closed weekends and major holidays. You have access to a nurse at all times for urgent questions. Please call the main number to the clinic [phone] and follow the prompts.  For any non-urgent questions, you may also contact your provider using MyChart. We now offer e-Visits for anyone 18 and older to request care online for non-urgent symptoms. For details visit [email or web address].   Also download the MyChart app! Go to the app store, search "MyChart", open the app, select [place], and log in with your MyChart username and password.  Masks are optional in the cancer centers. If you would like for your care team to wear a mask while they are taking care of you, please let them know. For doctor visits, patients may have with them one support person who is at least 62 years old. At this time, visitors are not allowed in the infusion area.   

## 2022-01-01 ENCOUNTER — Inpatient Hospital Stay: Payer: Self-pay

## 2022-01-01 DIAGNOSIS — C8510 Unspecified B-cell lymphoma, unspecified site: Secondary | ICD-10-CM

## 2022-01-01 MED ORDER — PEGFILGRASTIM-CBQV 6 MG/0.6ML ~~LOC~~ SOSY
6.0000 mg | PREFILLED_SYRINGE | Freq: Once | SUBCUTANEOUS | Status: AC
  Administered 2022-01-01: 6 mg via SUBCUTANEOUS
  Filled 2022-01-01: qty 0.6

## 2022-01-06 ENCOUNTER — Other Ambulatory Visit: Payer: Self-pay

## 2022-01-12 ENCOUNTER — Other Ambulatory Visit (HOSPITAL_COMMUNITY): Payer: Self-pay

## 2022-01-19 MED FILL — Dexamethasone Sodium Phosphate Inj 100 MG/10ML: INTRAMUSCULAR | Qty: 1 | Status: AC

## 2022-01-20 ENCOUNTER — Inpatient Hospital Stay: Payer: Self-pay | Attending: Oncology

## 2022-01-20 ENCOUNTER — Encounter: Payer: Self-pay | Admitting: Oncology

## 2022-01-20 ENCOUNTER — Inpatient Hospital Stay: Payer: Self-pay

## 2022-01-20 ENCOUNTER — Inpatient Hospital Stay (HOSPITAL_BASED_OUTPATIENT_CLINIC_OR_DEPARTMENT_OTHER): Payer: Self-pay | Admitting: Oncology

## 2022-01-20 VITALS — BP 132/85 | HR 76 | Resp 18 | Wt 177.9 lb

## 2022-01-20 DIAGNOSIS — C8510 Unspecified B-cell lymphoma, unspecified site: Secondary | ICD-10-CM

## 2022-01-20 DIAGNOSIS — R21 Rash and other nonspecific skin eruption: Secondary | ICD-10-CM | POA: Insufficient documentation

## 2022-01-20 DIAGNOSIS — Z79899 Other long term (current) drug therapy: Secondary | ICD-10-CM | POA: Insufficient documentation

## 2022-01-20 DIAGNOSIS — D701 Agranulocytosis secondary to cancer chemotherapy: Secondary | ICD-10-CM | POA: Insufficient documentation

## 2022-01-20 DIAGNOSIS — Z87891 Personal history of nicotine dependence: Secondary | ICD-10-CM | POA: Insufficient documentation

## 2022-01-20 DIAGNOSIS — D709 Neutropenia, unspecified: Secondary | ICD-10-CM | POA: Insufficient documentation

## 2022-01-20 DIAGNOSIS — T451X5A Adverse effect of antineoplastic and immunosuppressive drugs, initial encounter: Secondary | ICD-10-CM

## 2022-01-20 DIAGNOSIS — Z809 Family history of malignant neoplasm, unspecified: Secondary | ICD-10-CM | POA: Insufficient documentation

## 2022-01-20 DIAGNOSIS — R200 Anesthesia of skin: Secondary | ICD-10-CM | POA: Insufficient documentation

## 2022-01-20 DIAGNOSIS — Z832 Family history of diseases of the blood and blood-forming organs and certain disorders involving the immune mechanism: Secondary | ICD-10-CM | POA: Insufficient documentation

## 2022-01-20 DIAGNOSIS — Z5111 Encounter for antineoplastic chemotherapy: Secondary | ICD-10-CM

## 2022-01-20 DIAGNOSIS — Z8349 Family history of other endocrine, nutritional and metabolic diseases: Secondary | ICD-10-CM | POA: Insufficient documentation

## 2022-01-20 DIAGNOSIS — C8331 Diffuse large B-cell lymphoma, lymph nodes of head, face, and neck: Secondary | ICD-10-CM | POA: Insufficient documentation

## 2022-01-20 DIAGNOSIS — Z8249 Family history of ischemic heart disease and other diseases of the circulatory system: Secondary | ICD-10-CM | POA: Insufficient documentation

## 2022-01-20 DIAGNOSIS — Z8269 Family history of other diseases of the musculoskeletal system and connective tissue: Secondary | ICD-10-CM | POA: Insufficient documentation

## 2022-01-20 DIAGNOSIS — R202 Paresthesia of skin: Secondary | ICD-10-CM | POA: Insufficient documentation

## 2022-01-20 DIAGNOSIS — Z7961 Long term (current) use of immunomodulator: Secondary | ICD-10-CM | POA: Insufficient documentation

## 2022-01-20 LAB — CBC WITH DIFFERENTIAL/PLATELET
Abs Immature Granulocytes: 0.01 10*3/uL (ref 0.00–0.07)
Basophils Absolute: 0 10*3/uL (ref 0.0–0.1)
Basophils Relative: 1 %
Eosinophils Absolute: 0.1 10*3/uL (ref 0.0–0.5)
Eosinophils Relative: 7 %
HCT: 35.1 % — ABNORMAL LOW (ref 39.0–52.0)
Hemoglobin: 12.4 g/dL — ABNORMAL LOW (ref 13.0–17.0)
Immature Granulocytes: 1 %
Lymphocytes Relative: 34 %
Lymphs Abs: 0.7 10*3/uL (ref 0.7–4.0)
MCH: 32.6 pg (ref 26.0–34.0)
MCHC: 35.3 g/dL (ref 30.0–36.0)
MCV: 92.4 fL (ref 80.0–100.0)
Monocytes Absolute: 0.6 10*3/uL (ref 0.1–1.0)
Monocytes Relative: 28 %
Neutro Abs: 0.6 10*3/uL — ABNORMAL LOW (ref 1.7–7.7)
Neutrophils Relative %: 29 %
Platelets: 204 10*3/uL (ref 150–400)
RBC: 3.8 MIL/uL — ABNORMAL LOW (ref 4.22–5.81)
RDW: 14.8 % (ref 11.5–15.5)
WBC: 2 10*3/uL — ABNORMAL LOW (ref 4.0–10.5)
nRBC: 0 % (ref 0.0–0.2)

## 2022-01-20 LAB — COMPREHENSIVE METABOLIC PANEL
ALT: 23 U/L (ref 0–44)
AST: 32 U/L (ref 15–41)
Albumin: 4 g/dL (ref 3.5–5.0)
Alkaline Phosphatase: 82 U/L (ref 38–126)
Anion gap: 6 (ref 5–15)
BUN: 10 mg/dL (ref 8–23)
CO2: 26 mmol/L (ref 22–32)
Calcium: 8.9 mg/dL (ref 8.9–10.3)
Chloride: 107 mmol/L (ref 98–111)
Creatinine, Ser: 0.95 mg/dL (ref 0.61–1.24)
GFR, Estimated: >60 mL/min (ref >60)
Glucose, Bld: 103 mg/dL — ABNORMAL HIGH (ref 70–99)
Potassium: 3.7 mmol/L (ref 3.5–5.1)
Sodium: 139 mmol/L (ref 135–145)
Total Bilirubin: 0.6 mg/dL (ref 0.3–1.2)
Total Protein: 6.6 g/dL (ref 6.5–8.1)

## 2022-01-20 LAB — LACTATE DEHYDROGENASE: LDH: 172 U/L (ref 98–192)

## 2022-01-20 MED ORDER — HEPARIN SOD (PORK) LOCK FLUSH 100 UNIT/ML IV SOLN
500.0000 [IU] | Freq: Once | INTRAVENOUS | Status: AC
  Administered 2022-01-20: 500 [IU] via INTRAVENOUS
  Filled 2022-01-20: qty 5

## 2022-01-20 NOTE — Progress Notes (Signed)
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  This was followed by a PET CT scan which showed extensive FDG avid adenopathy in the neck chest abdomen and pelvis.  FDG avid splenic lesions.  Solitary intramuscular FDG avid lesion in the right biceps femoris muscle.   Supraclavicular excisional lymph node biopsy showed high-grade B-cell lymphoma germinal center type Ki-67 greater than 95%.  FISH testing was positive for Bcl-2 BCL6 and MYC consistent with triple hit lymphoma.  By NCCN IPI score would be 4 based on age and elevated LDH and stage IV (intramuscular biceps femoris lesion) which puts him in the high intermediate risk group. 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   Patient received RCHOP for cycle 1 with plans for DA [facility] with cycle 2. IT MTX for CNS prophylaxis and he has received 3 cycles but declined further cycles.  MRI brain negative for lymphoma.  Chemotherapy was not being able to escalated upwards due to neutropenia which has persisted to 3 weeks and he has been receiving treatment  every 4 weeks   PET CT scan after 3 cycles of chemotherapy showedComplete or near complete metabolic response.  Residual disease in the anterior upper right mediastinum slightly greater than background mediastinal activity   Patient found to have a clinically palpable right supraclavicular lymph node which was subsequently biopsied and was consistent with previously diagnosed lymphoma.  PET CT scan showedNew right level 5 adenopathy in the lower neck with dominant lymph node 1.6 cm Deauville 5.  No other findings of acute active lymphoma in chest abdomen pelvis and bones.  MRI brain negative for leptomeningeal disease.   Patient seen for second opinion by Dr. Lonia Blood at [facility] for consideration of CAR-T cell therapy.  Patient did not wish to proceed with CAR-T cell treatment after reviewing risks versus benefits.  Moreover patient's health insurance will also not cover CAR-T cell therapy. Patient started on second line tafasitamab/Revlimid regimen in June 2023.  He is on Revlimid 15 mg 3 weeks on and 1 week off.  Disease progression after 3 cycles and patient was switched to [facility] BR regimen    Interval history-patient feels well overall and tolerating chemotherapy well.  Occasional tingling numbness in his feet which comes and goes.  Skin rash has remained stable with no new lesions.  It occasionally flares up when it is hot outside.  ECOG PS- 0 Pain scale- 0   Review of systems- Review of Systems  Constitutional:  Negative for chills, fever, malaise/fatigue and weight loss.  HENT:  Negative for congestion, ear discharge and nosebleeds.   Eyes:  Negative for blurred vision.  Respiratory:  Negative for cough, hemoptysis, sputum  production, shortness of breath and wheezing.   Cardiovascular:  Negative for chest pain, palpitations, orthopnea and claudication.  Gastrointestinal:  Negative for abdominal pain, blood in stool, constipation, diarrhea, heartburn, melena, nausea and vomiting.   Genitourinary:  Negative for dysuria, flank pain, frequency, hematuria and urgency.  Musculoskeletal:  Negative for back pain, joint pain and myalgias.  Skin:  Negative for rash.  Neurological:  Negative for dizziness, tingling, focal weakness, seizures, weakness and headaches.  Endo/Heme/Allergies:  Does not bruise/bleed easily.  Psychiatric/Behavioral:  Negative for depression and suicidal ideas. The patient does not have insomnia.       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     Past Surgical History:  Procedure Laterality Date   BONE MARROW BIOPSY     COLONOSCOPY     EXCISION MASS NECK Right 06/12/2020   Procedure: EXCISION MASS NECK;  Surgeon: Jules Husbands, MD;  Location: [facility] ORS;  Service: General;  Laterality: Right;   HERNIA REPAIR Right    at age 41-RIH   39 [place] Right 06/24/2020   Procedure: INSERTION PORT-A-CATH;  Surgeon: Jules Husbands, MD;  Location: [facility] ORS;  Service: General;  Laterality: Right;    Social History   Socioeconomic History   Marital status: Single    Spouse name: Not on file   Number of children: Not on file   Years of education: Not on file   Highest education level: Not on file  Occupational History   Not on file  Tobacco Use   Smoking status: Former    Types: Cigarettes    Quit date: 06/13/2020    Years since quitting: 1.6   Smokeless tobacco: Never  Vaping Use   Vaping Use: Never used  Substance and Sexual Activity   Alcohol use: Not Currently   Drug use: Not Currently    Types: Marijuana   Sexual activity: Not Currently  Other Topics Concern   Not on file  Social History Narrative   Has daughter and grandson in the home   Social Determinants of Health   Financial Resource Strain: Not on file  Food Insecurity: Not on file  Transportation Needs:  Not on file  Physical Activity: Not on file  Stress: Not on file  Social Connections: Not on file  Intimate Partner Violence: Not on file    Family History  Problem Relation Age of Onset   Anemia Mother    Hypertension Mother    Goiter Mother    Cancer Father    Cancer Sister    Multiple sclerosis Sister      Current Outpatient Medications:    acyclovir (ZOVIRAX) 400 MG tablet, Take 1 tablet (400 mg total) by mouth 2 (two) times daily., Disp: 60 tablet, Rfl: 2   allopurinol (ZYLOPRIM) 300 MG tablet, TAKE 1 TABLET BY MOUTH EVERY DAY, Disp: 30 tablet, Rfl: 1   aspirin EC 81 MG tablet, Take 1 tablet (81 mg total) by mouth daily. Swallow whole., Disp: 30 tablet, Rfl: 0   atenolol (TENORMIN) 25 MG tablet, Take 25 mg by mouth daily., Disp: , Rfl:    cholecalciferol (VITAMIN D3) 25 MCG (1000 UNIT) tablet, Take 1,000 Units by mouth daily., Disp: , Rfl:    lisinopril (ZESTRIL) 10 MG tablet, Take 10 mg by mouth daily.,  Disp: , Rfl:    Tenofovir Alafenamide Fumarate (VEMLIDY) 25 MG TABS, Take 1 tablet (25 mg total) by mouth daily. Take with food., Disp: 30 tablet, Rfl: 2   triamcinolone cream (KENALOG) 0.5 %, Apply 1 Application topically 3 (three) times daily. For the spots you have on body, Disp: 30 g, Rfl: 0   vitamin C (ASCORBIC ACID) 500 MG tablet, Take 500 mg by mouth daily., Disp: , Rfl:    Omega-3 Fatty Acids (FISH OIL ADULT GUMMIES PO), Take by mouth., Disp: , Rfl:    sulfamethoxazole-trimethoprim (BACTRIM DS) 800-160 MG tablet, Take 1 tablet by mouth 3 (three) times a week. (Patient not taking: Reported on 12/30/2021), Disp: , Rfl:  No current facility-administered medications for this visit.  Facility-Administered Medications Ordered in Other Visits:    0.9 %  sodium chloride infusion, , Intravenous, Continuous, Sindy Guadeloupe, MD, Stopped at 09/09/20 1507   0.9 %  sodium chloride infusion, , Intravenous, Continuous, Cammie Sickle, MD, Stopped at 09/11/20 1502   0.9 %   sodium chloride infusion, , Intravenous, Continuous, Sindy Guadeloupe, MD, Stopped at 11/06/20 1424   heparin lock flush 100 unit/mL, 500 Units, Intravenous, Once, Sindy Guadeloupe, MD   methotrexate (PF) 12 mg in sodium chloride (PF) 0.9 % INTRATHECAL chemo injection, , Intrathecal, Once, Sindy Guadeloupe, MD   sodium chloride flush (NS) 0.9 % injection 10 mL, 10 mL, Intravenous, PRN, Sindy Guadeloupe, MD, 10 mL at 11/18/21 0910   sodium chloride flush (NS) 0.9 % injection 10 mL, 10 mL, Intracatheter, PRN, Sindy Guadeloupe, MD, 10 mL at 12/09/21 0843  Physical exam:  Vitals:   01/20/22 0835  BP: 132/85  Pulse: 76  Resp: 18  SpO2: 100%  Weight: 177 lb 14.4 oz (80.7 kg)   Physical Exam Cardiovascular:     Rate and Rhythm: Normal rate and regular rhythm.     Heart sounds: Normal heart sounds.  Pulmonary:     Effort: Pulmonary effort is normal.     Breath sounds: Normal breath sounds.  Abdominal:     General: Bowel sounds are normal.     Palpations: Abdomen is soft.  Skin:    General: Skin is warm and dry.  Neurological:     Mental Status: He is alert and oriented to person, place, and time.         Latest Ref Rng & Units 01/20/2022    8:14 AM  CMP  Glucose 70 - 99 mg/dL 103   BUN 8 - 23 mg/dL 10   Creatinine 0.61 - 1.24 mg/dL 0.95   Sodium 135 - 145 mmol/L 139   Potassium 3.5 - 5.1 mmol/L 3.7   Chloride 98 - 111 mmol/L 107   CO2 22 - 32 mmol/L 26   Calcium 8.9 - 10.3 mg/dL 8.9   Total Protein 6.5 - 8.1 g/dL 6.6   Total Bilirubin 0.3 - 1.2 mg/dL 0.6   Alkaline Phos 38 - 126 U/L 82   AST 15 - 41 U/L 32   ALT 0 - 44 U/L 23       Latest Ref Rng & Units 01/20/2022    8:14 AM  CBC  WBC 4.0 - 10.5 K/uL 2.0   Hemoglobin 13.0 - 17.0 g/dL 12.4   Hematocrit 39.0 - 52.0 % 35.1   Platelets 150 - 400 K/uL 204     No images are attached to the encounter.  No results found.   Assessment and plan- Patient  is a 61 y.o. male with relapsed/primary refractory triple hit diffuse large  B-cell lymphoma .  He is here for on treatment assessment prior to cycle 5 of polatuzumab Bendamustine Rituxan chemotherapy  White cell count is 2 today with an ANC0.6 despite receiving Neulasta with last cycle.  He will therefore not get chemotherapy today and it will be deferred by 1 week.  He will directly proceed with Pola BR cycle 5 in 1 week and I will see him back 4 weeks from now for cycle 6.  I will repeat a PET scan after 6 cycles followed by continued surveillance.  Neutropenic precautions reviewed.  He will continue acyclovir prophylaxis.   Visit Diagnosis 1. High grade B-cell lymphoma ([place])   2. Encounter for antineoplastic chemotherapy   3. Chemotherapy induced neutropenia (H[facility])      Dr. Randa Evens, MD, MPH [facility] at [facility] [phone] 01/20/2022 8:56 AM

## 2022-01-20 NOTE — Progress Notes (Signed)
Pt states he has noticed when standing up to long his legs cause discomfort.

## 2022-01-21 ENCOUNTER — Other Ambulatory Visit: Payer: Self-pay | Admitting: Oncology

## 2022-01-21 ENCOUNTER — Inpatient Hospital Stay: Payer: Self-pay

## 2022-01-21 ENCOUNTER — Inpatient Hospital Stay: Payer: Self-pay | Admitting: Pharmacist

## 2022-01-21 ENCOUNTER — Other Ambulatory Visit: Payer: Self-pay

## 2022-01-21 DIAGNOSIS — C8510 Unspecified B-cell lymphoma, unspecified site: Secondary | ICD-10-CM

## 2022-01-22 ENCOUNTER — Inpatient Hospital Stay: Payer: Self-pay

## 2022-01-26 MED FILL — Dexamethasone Sodium Phosphate Inj 100 MG/10ML: INTRAMUSCULAR | Qty: 1 | Status: AC

## 2022-01-27 ENCOUNTER — Inpatient Hospital Stay: Payer: Self-pay

## 2022-01-27 ENCOUNTER — Other Ambulatory Visit: Payer: Self-pay | Admitting: Oncology

## 2022-01-27 VITALS — BP 132/84 | HR 65 | Temp 96.7°F | Resp 18

## 2022-01-27 DIAGNOSIS — C8510 Unspecified B-cell lymphoma, unspecified site: Secondary | ICD-10-CM

## 2022-01-27 DIAGNOSIS — Z95828 Presence of other vascular implants and grafts: Secondary | ICD-10-CM

## 2022-01-27 LAB — CBC WITH DIFFERENTIAL/PLATELET
Abs Immature Granulocytes: 0 10*3/uL (ref 0.00–0.07)
Basophils Absolute: 0 10*3/uL (ref 0.0–0.1)
Basophils Relative: 1 %
Eosinophils Absolute: 0.2 10*3/uL (ref 0.0–0.5)
Eosinophils Relative: 9 %
HCT: 36.3 % — ABNORMAL LOW (ref 39.0–52.0)
Hemoglobin: 12.8 g/dL — ABNORMAL LOW (ref 13.0–17.0)
Immature Granulocytes: 0 %
Lymphocytes Relative: 32 %
Lymphs Abs: 0.6 10*3/uL — ABNORMAL LOW (ref 0.7–4.0)
MCH: 32.2 pg (ref 26.0–34.0)
MCHC: 35.3 g/dL (ref 30.0–36.0)
MCV: 91.2 fL (ref 80.0–100.0)
Monocytes Absolute: 0.4 10*3/uL (ref 0.1–1.0)
Monocytes Relative: 19 %
Neutro Abs: 0.8 10*3/uL — ABNORMAL LOW (ref 1.7–7.7)
Neutrophils Relative %: 39 %
Platelets: 218 10*3/uL (ref 150–400)
RBC: 3.98 MIL/uL — ABNORMAL LOW (ref 4.22–5.81)
RDW: 14 % (ref 11.5–15.5)
WBC: 1.9 10*3/uL — ABNORMAL LOW (ref 4.0–10.5)
nRBC: 0 % (ref 0.0–0.2)

## 2022-01-27 LAB — COMPREHENSIVE METABOLIC PANEL
ALT: 22 U/L (ref 0–44)
AST: 36 U/L (ref 15–41)
Albumin: 4 g/dL (ref 3.5–5.0)
Alkaline Phosphatase: 84 U/L (ref 38–126)
Anion gap: 5 (ref 5–15)
BUN: 10 mg/dL (ref 8–23)
CO2: 26 mmol/L (ref 22–32)
Calcium: 9 mg/dL (ref 8.9–10.3)
Chloride: 106 mmol/L (ref 98–111)
Creatinine, Ser: 0.93 mg/dL (ref 0.61–1.24)
GFR, Estimated: >60 mL/min (ref >60)
Glucose, Bld: 130 mg/dL — ABNORMAL HIGH (ref 70–99)
Potassium: 3.7 mmol/L (ref 3.5–5.1)
Sodium: 137 mmol/L (ref 135–145)
Total Bilirubin: 0.6 mg/dL (ref 0.3–1.2)
Total Protein: 6.5 g/dL (ref 6.5–8.1)

## 2022-01-27 MED ORDER — HEPARIN SOD (PORK) LOCK FLUSH 100 UNIT/ML IV SOLN
500.0000 [IU] | Freq: Once | INTRAVENOUS | Status: AC
  Administered 2022-01-27: 500 [IU] via INTRAVENOUS
  Filled 2022-01-27: qty 5

## 2022-01-27 MED ORDER — SODIUM CHLORIDE 0.9% FLUSH
10.0000 mL | INTRAVENOUS | Status: DC | PRN
  Administered 2022-01-27: 10 mL via INTRAVENOUS
  Filled 2022-01-27: qty 10

## 2022-01-27 NOTE — Progress Notes (Signed)
MD notified. No treatment today. Educated pt. and understands. Parameters was not met for the below.   Latest Reference Range & Units 01/27/22 08:13  NEUT# 1.7 - 7.7 K/uL 0.8 (L)  (L): Data is abnormally low

## 2022-01-28 ENCOUNTER — Other Ambulatory Visit: Payer: Self-pay

## 2022-01-28 ENCOUNTER — Inpatient Hospital Stay: Payer: Self-pay

## 2022-01-29 ENCOUNTER — Inpatient Hospital Stay: Payer: Self-pay

## 2022-02-01 ENCOUNTER — Other Ambulatory Visit (HOSPITAL_COMMUNITY): Payer: Self-pay

## 2022-02-03 ENCOUNTER — Other Ambulatory Visit (HOSPITAL_COMMUNITY): Payer: Self-pay

## 2022-02-05 ENCOUNTER — Other Ambulatory Visit (HOSPITAL_COMMUNITY): Payer: Self-pay

## 2022-02-09 ENCOUNTER — Other Ambulatory Visit: Payer: Self-pay

## 2022-02-10 ENCOUNTER — Ambulatory Visit: Payer: Medicaid Other | Admitting: Oncology

## 2022-02-10 ENCOUNTER — Other Ambulatory Visit: Payer: Medicaid Other

## 2022-02-10 ENCOUNTER — Ambulatory Visit: Payer: Medicaid Other

## 2022-02-10 MED FILL — Dexamethasone Sodium Phosphate Inj 100 MG/10ML: INTRAMUSCULAR | Qty: 1 | Status: AC

## 2022-02-11 ENCOUNTER — Other Ambulatory Visit: Payer: Self-pay | Admitting: Oncology

## 2022-02-11 ENCOUNTER — Inpatient Hospital Stay: Payer: Self-pay

## 2022-02-11 ENCOUNTER — Ambulatory Visit: Payer: Medicaid Other

## 2022-02-11 DIAGNOSIS — C8510 Unspecified B-cell lymphoma, unspecified site: Secondary | ICD-10-CM

## 2022-02-11 LAB — CBC WITH DIFFERENTIAL/PLATELET
Abs Immature Granulocytes: 0.07 10*3/uL (ref 0.00–0.07)
Basophils Absolute: 0 10*3/uL (ref 0.0–0.1)
Basophils Relative: 1 %
Eosinophils Absolute: 0.1 10*3/uL (ref 0.0–0.5)
Eosinophils Relative: 5 %
HCT: 36.7 % — ABNORMAL LOW (ref 39.0–52.0)
Hemoglobin: 12.9 g/dL — ABNORMAL LOW (ref 13.0–17.0)
Immature Granulocytes: 4 %
Lymphocytes Relative: 27 %
Lymphs Abs: 0.5 10*3/uL — ABNORMAL LOW (ref 0.7–4.0)
MCH: 31.7 pg (ref 26.0–34.0)
MCHC: 35.1 g/dL (ref 30.0–36.0)
MCV: 90.2 fL (ref 80.0–100.0)
Monocytes Absolute: 0.3 10*3/uL (ref 0.1–1.0)
Monocytes Relative: 15 %
Neutro Abs: 0.9 10*3/uL — ABNORMAL LOW (ref 1.7–7.7)
Neutrophils Relative %: 48 %
Platelets: 160 10*3/uL (ref 150–400)
RBC: 4.07 MIL/uL — ABNORMAL LOW (ref 4.22–5.81)
RDW: 13.5 % (ref 11.5–15.5)
WBC: 1.9 10*3/uL — ABNORMAL LOW (ref 4.0–10.5)
nRBC: 0 % (ref 0.0–0.2)

## 2022-02-11 LAB — COMPREHENSIVE METABOLIC PANEL
ALT: 20 U/L (ref 0–44)
AST: 36 U/L (ref 15–41)
Albumin: 4 g/dL (ref 3.5–5.0)
Alkaline Phosphatase: 82 U/L (ref 38–126)
Anion gap: 8 (ref 5–15)
BUN: 12 mg/dL (ref 8–23)
CO2: 24 mmol/L (ref 22–32)
Calcium: 8.9 mg/dL (ref 8.9–10.3)
Chloride: 106 mmol/L (ref 98–111)
Creatinine, Ser: 0.96 mg/dL (ref 0.61–1.24)
GFR, Estimated: >60 mL/min (ref >60)
Glucose, Bld: 135 mg/dL — ABNORMAL HIGH (ref 70–99)
Potassium: 3.3 mmol/L — ABNORMAL LOW (ref 3.5–5.1)
Sodium: 138 mmol/L (ref 135–145)
Total Bilirubin: 0.7 mg/dL (ref 0.3–1.2)
Total Protein: 6.6 g/dL (ref 6.5–8.1)

## 2022-02-11 LAB — LACTATE DEHYDROGENASE: LDH: 197 U/L — ABNORMAL HIGH (ref 98–192)

## 2022-02-11 MED ORDER — HEPARIN SOD (PORK) LOCK FLUSH 100 UNIT/ML IV SOLN
500.0000 [IU] | Freq: Once | INTRAVENOUS | Status: AC
  Administered 2022-02-11: 500 [IU] via INTRAVENOUS
  Filled 2022-02-11: qty 5

## 2022-02-11 NOTE — Patient Instructions (Signed)

## 2022-02-12 ENCOUNTER — Other Ambulatory Visit: Payer: Self-pay | Admitting: Oncology

## 2022-02-12 ENCOUNTER — Inpatient Hospital Stay: Payer: Medicaid Other

## 2022-02-12 ENCOUNTER — Telehealth: Payer: Self-pay | Admitting: *Deleted

## 2022-02-12 ENCOUNTER — Ambulatory Visit: Payer: Medicaid Other

## 2022-02-12 ENCOUNTER — Encounter: Payer: Self-pay | Admitting: Oncology

## 2022-02-12 NOTE — Telephone Encounter (Signed)
Pt came in earlier this week and said that he was told by staff in pharmacy that he could get the med for itching cheaper at [place]. I looked everywhere in chart for meds and  could not find a pill for itching. I then called [facility] and talk to pharmacy and he told me the thing that he has not picked up and it cost litlle over 100 dollars and they put it back on the rack because he never came to get it and he assumed it was for itching. I then called pt and left message that the only thing that was missing is the acyclovir and wanted

## 2022-02-15 ENCOUNTER — Other Ambulatory Visit: Payer: Self-pay | Admitting: *Deleted

## 2022-02-15 ENCOUNTER — Telehealth: Payer: Self-pay | Admitting: *Deleted

## 2022-02-15 ENCOUNTER — Inpatient Hospital Stay: Payer: Medicaid Other

## 2022-02-15 DIAGNOSIS — C8510 Unspecified B-cell lymphoma, unspecified site: Secondary | ICD-10-CM

## 2022-02-15 MED ORDER — ACYCLOVIR 400 MG PO TABS: 400.0000 mg | ORAL_TABLET | Freq: Two times a day (BID) | ORAL | 1 refills | Status: DC

## 2022-02-15 NOTE — Telephone Encounter (Signed)
Patient called me to say that he got my message and he would like me to put in rx for the acyclovir to [facility] [place]. He says that is what the [facility] staff said he cna get it there that is cheaper for him

## 2022-02-17 ENCOUNTER — Ambulatory Visit: Payer: Medicaid Other

## 2022-02-17 ENCOUNTER — Other Ambulatory Visit: Payer: Medicaid Other

## 2022-02-17 ENCOUNTER — Other Ambulatory Visit: Payer: Self-pay

## 2022-02-17 ENCOUNTER — Ambulatory Visit: Payer: Medicaid Other | Admitting: Oncology

## 2022-02-18 ENCOUNTER — Ambulatory Visit: Payer: Medicaid Other

## 2022-02-19 ENCOUNTER — Ambulatory Visit: Payer: Medicaid Other

## 2022-02-24 MED FILL — Dexamethasone Sodium Phosphate Inj 100 MG/10ML: INTRAMUSCULAR | Qty: 1 | Status: AC

## 2022-02-25 ENCOUNTER — Encounter: Payer: Self-pay | Admitting: Oncology

## 2022-02-25 ENCOUNTER — Inpatient Hospital Stay: Payer: Medicaid Other

## 2022-02-25 ENCOUNTER — Inpatient Hospital Stay: Payer: Medicaid Other | Attending: Oncology

## 2022-02-25 ENCOUNTER — Inpatient Hospital Stay (HOSPITAL_BASED_OUTPATIENT_CLINIC_OR_DEPARTMENT_OTHER): Payer: Medicaid Other | Admitting: Oncology

## 2022-02-25 ENCOUNTER — Telehealth: Payer: Self-pay

## 2022-02-25 VITALS — BP 147/90 | HR 71 | Temp 96.0°F | Wt 182.2 lb

## 2022-02-25 DIAGNOSIS — Z95828 Presence of other vascular implants and grafts: Secondary | ICD-10-CM

## 2022-02-25 DIAGNOSIS — Z809 Family history of malignant neoplasm, unspecified: Secondary | ICD-10-CM | POA: Insufficient documentation

## 2022-02-25 DIAGNOSIS — Z79899 Other long term (current) drug therapy: Secondary | ICD-10-CM | POA: Insufficient documentation

## 2022-02-25 DIAGNOSIS — Z79624 Long term (current) use of inhibitors of nucleotide synthesis: Secondary | ICD-10-CM | POA: Insufficient documentation

## 2022-02-25 DIAGNOSIS — Z87891 Personal history of nicotine dependence: Secondary | ICD-10-CM | POA: Diagnosis not present

## 2022-02-25 DIAGNOSIS — C8331 Diffuse large B-cell lymphoma, lymph nodes of head, face, and neck: Secondary | ICD-10-CM | POA: Insufficient documentation

## 2022-02-25 DIAGNOSIS — C8510 Unspecified B-cell lymphoma, unspecified site: Secondary | ICD-10-CM

## 2022-02-25 DIAGNOSIS — Z832 Family history of diseases of the blood and blood-forming organs and certain disorders involving the immune mechanism: Secondary | ICD-10-CM | POA: Insufficient documentation

## 2022-02-25 DIAGNOSIS — D708 Other neutropenia: Secondary | ICD-10-CM | POA: Diagnosis not present

## 2022-02-25 DIAGNOSIS — R202 Paresthesia of skin: Secondary | ICD-10-CM | POA: Diagnosis not present

## 2022-02-25 DIAGNOSIS — Z8269 Family history of other diseases of the musculoskeletal system and connective tissue: Secondary | ICD-10-CM | POA: Insufficient documentation

## 2022-02-25 DIAGNOSIS — Z8249 Family history of ischemic heart disease and other diseases of the circulatory system: Secondary | ICD-10-CM | POA: Diagnosis not present

## 2022-02-25 DIAGNOSIS — R200 Anesthesia of skin: Secondary | ICD-10-CM | POA: Diagnosis not present

## 2022-02-25 DIAGNOSIS — Z7961 Long term (current) use of immunomodulator: Secondary | ICD-10-CM | POA: Insufficient documentation

## 2022-02-25 DIAGNOSIS — Z8349 Family history of other endocrine, nutritional and metabolic diseases: Secondary | ICD-10-CM | POA: Insufficient documentation

## 2022-02-25 DIAGNOSIS — R21 Rash and other nonspecific skin eruption: Secondary | ICD-10-CM | POA: Diagnosis not present

## 2022-02-25 LAB — CBC WITH DIFFERENTIAL/PLATELET
Abs Immature Granulocytes: 0.01 10*3/uL (ref 0.00–0.07)
Basophils Absolute: 0 10*3/uL (ref 0.0–0.1)
Basophils Relative: 1 %
Eosinophils Absolute: 0.1 10*3/uL (ref 0.0–0.5)
Eosinophils Relative: 3 %
HCT: 36.8 % — ABNORMAL LOW (ref 39.0–52.0)
Hemoglobin: 13.1 g/dL (ref 13.0–17.0)
Immature Granulocytes: 1 %
Lymphocytes Relative: 34 %
Lymphs Abs: 0.6 10*3/uL — ABNORMAL LOW (ref 0.7–4.0)
MCH: 31.9 pg (ref 26.0–34.0)
MCHC: 35.6 g/dL (ref 30.0–36.0)
MCV: 89.5 fL (ref 80.0–100.0)
Monocytes Absolute: 0.2 10*3/uL (ref 0.1–1.0)
Monocytes Relative: 9 %
Neutro Abs: 0.9 10*3/uL — ABNORMAL LOW (ref 1.7–7.7)
Neutrophils Relative %: 52 %
Platelets: 192 10*3/uL (ref 150–400)
RBC: 4.11 MIL/uL — ABNORMAL LOW (ref 4.22–5.81)
RDW: 13.4 % (ref 11.5–15.5)
WBC: 1.8 10*3/uL — ABNORMAL LOW (ref 4.0–10.5)
nRBC: 0 % (ref 0.0–0.2)

## 2022-02-25 LAB — COMPREHENSIVE METABOLIC PANEL
ALT: 22 U/L (ref 0–44)
AST: 36 U/L (ref 15–41)
Albumin: 4.1 g/dL (ref 3.5–5.0)
Alkaline Phosphatase: 75 U/L (ref 38–126)
Anion gap: 8 (ref 5–15)
BUN: 14 mg/dL (ref 8–23)
CO2: 26 mmol/L (ref 22–32)
Calcium: 9.1 mg/dL (ref 8.9–10.3)
Chloride: 106 mmol/L (ref 98–111)
Creatinine, Ser: 1.03 mg/dL (ref 0.61–1.24)
GFR, Estimated: >60 mL/min (ref >60)
Glucose, Bld: 109 mg/dL — ABNORMAL HIGH (ref 70–99)
Potassium: 3.9 mmol/L (ref 3.5–5.1)
Sodium: 140 mmol/L (ref 135–145)
Total Bilirubin: 0.5 mg/dL (ref 0.3–1.2)
Total Protein: 6.7 g/dL (ref 6.5–8.1)

## 2022-02-25 LAB — LACTATE DEHYDROGENASE: LDH: 240 U/L — ABNORMAL HIGH (ref 98–192)

## 2022-02-25 MED ORDER — HEPARIN SOD (PORK) LOCK FLUSH 100 UNIT/ML IV SOLN
500.0000 [IU] | Freq: Once | INTRAVENOUS | Status: AC
  Administered 2022-02-25: 500 [IU] via INTRAVENOUS
  Filled 2022-02-25: qty 5

## 2022-02-25 NOTE — Assessment & Plan Note (Addendum)
Last chemotherapy was 12/31/21 He has persistent neutropenia despite holding chemo and GCSF Discussed with patient about obtaining bone marrow biopsy. He agrees.

## 2022-02-25 NOTE — Telephone Encounter (Signed)
Patient is scheduled for Bone Marrow Bx on 03/09/22 at 8:30. Dr. Tasia Catchings would like for patient to follow-up with Dr. Janese Banks one week after Biopsy. Can you please schedule patient to see Dr. Janese Banks one week after 03/09/22 and notify patient of appointment date and time.

## 2022-02-25 NOTE — Assessment & Plan Note (Signed)
Labs are reviewed and discussed with patient.  ANC is persistently low.  I will hold off chemotherapy today.  Repeat PET scan for restaging.

## 2022-02-25 NOTE — Progress Notes (Signed)
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  This was followed by a PET CT scan which showed extensive FDG avid adenopathy in the neck chest abdomen and pelvis.  FDG avid splenic lesions.  Solitary intramuscular FDG avid lesion in the right biceps femoris muscle.   Supraclavicular excisional lymph node biopsy showed high-grade B-cell lymphoma germinal center type Ki-67 greater than 95%.  FISH testing was positive for Bcl-2 BCL6 and MYC consistent with triple hit lymphoma.  By NCCN IPI score would be 4 based on age and elevated LDH and stage IV (intramuscular biceps femoris lesion) which puts him in the high intermediate risk group. 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   Patient received RCHOP for cycle 1 with plans for DA [facility] with cycle 2. IT MTX for CNS prophylaxis and he has received 3 cycles but declined further cycles.  MRI brain negative for lymphoma.  Chemotherapy was not being able to escalated upwards due to neutropenia which has persisted to 3 weeks and he has been receiving treatment every 4 weeks   PET CT scan  after 3 cycles of chemotherapy showedComplete or near complete metabolic response.  Residual disease in the anterior upper right mediastinum slightly greater than background mediastinal activity   Patient found to have a clinically palpable right supraclavicular lymph node which was subsequently biopsied and was consistent with previously diagnosed lymphoma.  PET CT scan showedNew right level 5 adenopathy in the lower neck with dominant lymph node 1.6 cm Deauville 5.  No other findings of acute active lymphoma in chest abdomen pelvis and bones.  MRI brain negative for leptomeningeal disease.   Patient seen for second opinion by Dr. Lonia Blood at [facility] for consideration of CAR-T cell therapy.  Patient did not wish to proceed with CAR-T cell treatment after reviewing risks versus benefits.  Moreover patient's health insurance will also not cover CAR-T cell therapy. Patient started on second line tafasitamab/Revlimid regimen in June 2023.  He is on Revlimid 15 mg 3 weeks on and 1 week off.  Disease progression after 3 cycles and patient was switched to [facility] BR regimen    Interval history- Patient follows up with Dr.Rao, I am covering her to see this patient today. Occasional tingling numbness in his feet which comes and goes.  Skin rash has remained stable with no new lesions.   Patient feels well today. Denies nausea vomiting, diarrhea, fever. He has good appetite and he has gained weight.   ECOG PS- 0 Pain scale- 0   Review of systems- Review of Systems  Constitutional:  Negative for chills, fever, malaise/fatigue and weight loss.  HENT:  Negative for congestion, ear discharge and nosebleeds.  Eyes:  Negative for blurred vision.  Respiratory:  Negative for cough, hemoptysis, sputum production, shortness of breath and wheezing.   Cardiovascular:  Negative for chest pain, palpitations, orthopnea and claudication.  Gastrointestinal:  Negative for abdominal pain, blood in stool, constipation, diarrhea,  heartburn, melena, nausea and vomiting.  Genitourinary:  Negative for dysuria, flank pain, frequency, hematuria and urgency.  Musculoskeletal:  Negative for back pain, joint pain and myalgias.  Skin:  Negative for rash.  Neurological:  Negative for dizziness, tingling, focal weakness, seizures, weakness and headaches.  Endo/Heme/Allergies:  Does not bruise/bleed easily.  Psychiatric/Behavioral:  Negative for depression and suicidal ideas. The patient does not have insomnia.       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     Past Surgical History:  Procedure Laterality Date   BONE MARROW BIOPSY     COLONOSCOPY     EXCISION MASS NECK Right 06/12/2020   Procedure: EXCISION MASS NECK;  Surgeon: Jules Husbands, MD;  Location: [facility] ORS;  Service: General;  Laterality: Right;   HERNIA REPAIR Right    at age 32-RIH   24 [place] Right 06/24/2020   Procedure: INSERTION PORT-A-CATH;  Surgeon: Jules Husbands, MD;  Location: [facility] ORS;  Service: General;  Laterality: Right;    Social History   Socioeconomic History   Marital status: Single    Spouse name: Not on file   Number of children: Not on file   Years of education: Not on file   Highest education level: Not on file  Occupational History   Not on file  Tobacco Use   Smoking status: Former    Types: Cigarettes    Quit date: 06/13/2020    Years since quitting: 1.7   Smokeless tobacco: Never  Vaping Use   Vaping Use: Never used  Substance and Sexual Activity   Alcohol use: Not Currently   Drug use: Not Currently    Types: Marijuana   Sexual activity: Not Currently  Other Topics Concern   Not on file  Social History Narrative   Has daughter and grandson in the home   Social Determinants of Health   Financial Resource Strain: Not on file  Food  Insecurity: Not on file  Transportation Needs: Not on file  Physical Activity: Not on file  Stress: Not on file  Social Connections: Not on file  Intimate Partner Violence: Not on file    Family History  Problem Relation Age of Onset   Anemia Mother    Hypertension Mother    Goiter Mother    Cancer Father    Cancer Sister    Multiple sclerosis Sister      Current Outpatient Medications:    acyclovir (ZOVIRAX) 400 MG tablet, Take 1 tablet (400 mg total) by mouth 2 (two) times daily., Disp: 180 tablet, Rfl: 1   allopurinol (ZYLOPRIM) 300 MG tablet, TAKE 1 TABLET BY MOUTH EVERY DAY, Disp: 30 tablet, Rfl: 1   aspirin EC 81 MG tablet, Take 1 tablet (81 mg total) by mouth daily. Swallow whole., Disp: 30 tablet, Rfl: 0   atenolol (TENORMIN) 25 MG tablet, Take 25 mg by mouth daily., Disp: , Rfl:    cholecalciferol (VITAMIN D3) 25 MCG (1000 UNIT) tablet, Take 1,000 Units by mouth daily., Disp: , Rfl:  lisinopril (ZESTRIL) 10 MG tablet, Take 10 mg by mouth daily., Disp: , Rfl:    Omega-3 Fatty Acids (FISH OIL ADULT GUMMIES PO), Take by mouth., Disp: , Rfl:    Tenofovir Alafenamide Fumarate (VEMLIDY) 25 MG TABS, Take 1 tablet (25 mg total) by mouth daily. Take with food., Disp: 30 tablet, Rfl: 2   vitamin C (ASCORBIC ACID) 500 MG tablet, Take 500 mg by mouth daily., Disp: , Rfl:  No current facility-administered medications for this visit.  Facility-Administered Medications Ordered in Other Visits:    0.9 %  sodium chloride infusion, , Intravenous, Continuous, Sindy Guadeloupe, MD, Stopped at 09/09/20 1507   0.9 %  sodium chloride infusion, , Intravenous, Continuous, Cammie Sickle, MD, Stopped at 09/11/20 1502   0.9 %  sodium chloride infusion, , Intravenous, Continuous, Sindy Guadeloupe, MD, Stopped at 11/06/20 1424   heparin lock flush 100 unit/mL, 500 Units, Intravenous, Once, Sindy Guadeloupe, MD   methotrexate (PF) 12 mg in sodium chloride (PF) 0.9 % INTRATHECAL chemo injection, ,  Intrathecal, Once, Sindy Guadeloupe, MD   sodium chloride flush (NS) 0.9 % injection 10 mL, 10 mL, Intravenous, PRN, Sindy Guadeloupe, MD, 10 mL at 11/18/21 0910   sodium chloride flush (NS) 0.9 % injection 10 mL, 10 mL, Intracatheter, PRN, Sindy Guadeloupe, MD, 10 mL at 12/09/21 0843  Physical exam:  Vitals:   02/25/22 0927  BP: (!) 147/90  Pulse: 71  Temp: (!) 96 F (35.6 C)  TempSrc: Tympanic  SpO2: 100%  Weight: 182 lb 3.2 oz (82.6 kg)   Physical Exam Cardiovascular:     Rate and Rhythm: Normal rate and regular rhythm.     Heart sounds: Normal heart sounds.  Pulmonary:     Effort: Pulmonary effort is normal.     Breath sounds: Normal breath sounds.  Abdominal:     General: Bowel sounds are normal.     Palpations: Abdomen is soft.  Skin:    General: Skin is warm and dry.  Neurological:     Mental Status: He is alert and oriented to person, place, and time.         Latest Ref Rng & Units 02/25/2022    8:51 AM  CMP  Glucose 70 - 99 mg/dL 109   BUN 8 - 23 mg/dL 14   Creatinine 0.61 - 1.24 mg/dL 1.03   Sodium 135 - 145 mmol/L 140   Potassium 3.5 - 5.1 mmol/L 3.9   Chloride 98 - 111 mmol/L 106   CO2 22 - 32 mmol/L 26   Calcium 8.9 - 10.3 mg/dL 9.1   Total Protein 6.5 - 8.1 g/dL 6.7   Total Bilirubin 0.3 - 1.2 mg/dL 0.5   Alkaline Phos 38 - 126 U/L 75   AST 15 - 41 U/L 36   ALT 0 - 44 U/L 22       Latest Ref Rng & Units 02/25/2022    8:51 AM  CBC  WBC 4.0 - 10.5 K/uL 1.8   Hemoglobin 13.0 - 17.0 g/dL 13.1   Hematocrit 39.0 - 52.0 % 36.8   Platelets 150 - 400 K/uL 192     No images are attached to the encounter.  No results found.  ASSESSMENT & PLAN:  Patient is a 60 y.o. 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  He is here for on treatment assessment prior to cycle 5 of polatuzumab Bendamustine Rituxan chemotherapy  High grade B-cell  lymphoma [facility]) Labs are reviewed and discussed with patient.  ANC is persistently  low.  I will hold off chemotherapy today.  Repeat PET scan for restaging.   Neutropenia ([place]) Last chemotherapy was 12/31/21 He has persistent neutropenia despite holding chemo and GCSF Discussed with patient about obtaining bone marrow biopsy. He agrees.    Orders Placed This Encounter  Procedures   NM PET Image Restag (PS) Skull Base To Thigh    ASAP    Standing Status:   Future    Standing Expiration Date:   02/25/2023    Order Specific Question:   If indicated for the ordered procedure, I authorize the administration of a radiopharmaceutical per Radiology protocol    Answer:   Yes    Order Specific Question:   Preferred imaging location?    Answer:   [facility]   CT BONE MARROW BIOPSY & ASPIRATION    ASAP    Standing Status:   Future    Standing Expiration Date:   02/26/2023    Order Specific Question:   Reason for Exam (SYMPTOM  OR DIAGNOSIS REQUIRED)    Answer:   Large B-cell Lymphoma    Order Specific Question:   Preferred location?    Answer:   [facility]   Follow up  with Dr. Janese Banks 1 week after bone marrow biopsy.  All questions were answered. The patient knows to call the clinic with any problems, questions or concerns.  Earlie Server, MD, PhD [facility] Hematology Oncology 02/25/2022

## 2022-02-26 ENCOUNTER — Inpatient Hospital Stay: Payer: Medicaid Other

## 2022-03-01 ENCOUNTER — Inpatient Hospital Stay: Payer: Medicaid Other

## 2022-03-02 ENCOUNTER — Ambulatory Visit
Admission: RE | Admit: 2022-03-02 | Discharge: 2022-03-02 | Disposition: A | Discharge status: Home / Self Care | Payer: Medicaid Other | Source: Ambulatory Visit | Attending: Oncology | Admitting: Oncology

## 2022-03-02 DIAGNOSIS — I700 Atherosclerosis of aorta: Secondary | ICD-10-CM | POA: Diagnosis not present

## 2022-03-02 DIAGNOSIS — C8510 Unspecified B-cell lymphoma, unspecified site: Secondary | ICD-10-CM | POA: Diagnosis not present

## 2022-03-02 LAB — GLUCOSE, CAPILLARY: Glucose-Capillary: 80 mg/dL (ref 70–99)

## 2022-03-02 MED ORDER — FLUDEOXYGLUCOSE F - 18 (FDG) INJECTION
9.5200 | Freq: Once | INTRAVENOUS | Status: AC | PRN
  Administered 2022-03-02: 9.52 via INTRAVENOUS

## 2022-03-03 ENCOUNTER — Ambulatory Visit: Payer: Medicaid Other

## 2022-03-03 ENCOUNTER — Ambulatory Visit: Payer: Medicaid Other | Admitting: Oncology

## 2022-03-03 ENCOUNTER — Other Ambulatory Visit: Payer: Medicaid Other

## 2022-03-04 ENCOUNTER — Ambulatory Visit: Payer: Medicaid Other

## 2022-03-05 ENCOUNTER — Ambulatory Visit: Payer: Medicaid Other

## 2022-03-05 NOTE — Progress Notes (Signed)
Patient for Bone Marrow Biopsy on Tues 03/09/22, I called and spoke with the patient on the phone and gave pre-procedure instructions. Pt was made aware to be here at 7:30a at the new entrance, NPO after MN prior to procedure as well as driver post procedure/recovery/discharge. Pt stated understanding.  Called 03/05/2022

## 2022-03-09 ENCOUNTER — Ambulatory Visit
Admission: RE | Admit: 2022-03-09 | Discharge: 2022-03-09 | Disposition: A | Discharge status: Home / Self Care | Payer: Medicaid Other | Source: Ambulatory Visit | Attending: Oncology | Admitting: Oncology

## 2022-03-09 DIAGNOSIS — D72819 Decreased white blood cell count, unspecified: Secondary | ICD-10-CM | POA: Insufficient documentation

## 2022-03-09 DIAGNOSIS — C8330 Diffuse large B-cell lymphoma, unspecified site: Secondary | ICD-10-CM | POA: Insufficient documentation

## 2022-03-09 DIAGNOSIS — C8510 Unspecified B-cell lymphoma, unspecified site: Secondary | ICD-10-CM

## 2022-03-09 LAB — CBC
HCT: 38.5 % — ABNORMAL LOW (ref 39.0–52.0)
Hemoglobin: 13.4 g/dL (ref 13.0–17.0)
MCH: 31.1 pg (ref 26.0–34.0)
MCHC: 34.8 g/dL (ref 30.0–36.0)
MCV: 89.3 fL (ref 80.0–100.0)
Platelets: 171 10*3/uL (ref 150–400)
RBC: 4.31 MIL/uL (ref 4.22–5.81)
RDW: 12.9 % (ref 11.5–15.5)
WBC: 2.1 10*3/uL — ABNORMAL LOW (ref 4.0–10.5)
nRBC: 0 % (ref 0.0–0.2)

## 2022-03-09 MED ORDER — FENTANYL CITRATE (PF) 100 MCG/2ML IJ SOLN
INTRAMUSCULAR | Status: AC | PRN
  Administered 2022-03-09: 25 ug via INTRAVENOUS

## 2022-03-09 MED ORDER — MIDAZOLAM HCL 2 MG/2ML IJ SOLN
INTRAMUSCULAR | Status: AC
  Filled 2022-03-09: qty 2

## 2022-03-09 MED ORDER — FENTANYL CITRATE (PF) 100 MCG/2ML IJ SOLN
INTRAMUSCULAR | Status: AC
  Filled 2022-03-09: qty 2

## 2022-03-09 MED ORDER — MIDAZOLAM HCL 2 MG/2ML IJ SOLN
INTRAMUSCULAR | Status: AC | PRN
  Administered 2022-03-09: 1 mg via INTRAVENOUS

## 2022-03-09 MED ORDER — HEPARIN SOD (PORK) LOCK FLUSH 100 UNIT/ML IV SOLN
INTRAVENOUS | Status: AC
  Filled 2022-03-09: qty 5

## 2022-03-09 NOTE — H&P (Signed)
Vascular and Interventional Radiology  PRE PROCEDURE H&P  Assessment  Plan:   Mr. MATAIO MELE is a 61 y.o. year old male who will undergo BONE MARROW BIOPSY in Interventional Radiology.  The procedure has been fully reviewed with the patient/patient's authorized representative. The risks, benefits and alternatives have been explained, and the patient/patient's authorized representative has consented to the procedure. -- The patient will accept blood products in an emergent situation. -- The patient does not have a Do Not Resuscitate order in effect.  HPI: Mr. RAMIE ERMAN is a 61 y.o. year old male w PMHx significant for DLBCL, relapsed with cervical adenopathy. Pt known to [place] service s/p initial BMBx in 06/25/20 [facility]) and [facility] [place] LNBx on 07/17/21 ([place]). He presents today for repeat BMBx for restaging. Informed consent was obtained, witnessed and placed in the patient's chart.  Allergies: No Known Allergies  Medications:   Current Outpatient Medications on File Prior to Encounter  Medication Sig Dispense Refill   acyclovir (ZOVIRAX) 400 MG tablet Take 1 tablet (400 mg total) by mouth 2 (two) times daily. 180 tablet 1   aspirin EC 81 MG tablet Take 1 tablet (81 mg total) by mouth daily. Swallow whole. 30 tablet 0   atenolol (TENORMIN) 25 MG tablet Take 25 mg by mouth daily.     cholecalciferol (VITAMIN D3) 25 MCG (1000 UNIT) tablet Take 1,000 Units by mouth daily.     lisinopril (ZESTRIL) 10 MG tablet Take 10 mg by mouth daily.     Omega-3 Fatty Acids (FISH OIL ADULT GUMMIES PO) Take by mouth.     vitamin C (ASCORBIC ACID) 500 MG tablet Take 500 mg by mouth daily.     allopurinol (ZYLOPRIM) 300 MG tablet TAKE 1 TABLET BY MOUTH EVERY DAY (Patient not taking: Reported on 03/09/2022) 30 tablet 1   Tenofovir Alafenamide Fumarate (VEMLIDY) 25 MG TABS Take 1 tablet (25 mg total) by mouth daily. Take with food. (Patient not taking: Reported on 03/09/2022) 30 tablet 2   [DISCONTINUED]  prochlorperazine (COMPAZINE) 10 MG tablet Take 1 tablet (10 mg total) by mouth every 6 (six) hours as needed (Nausea or vomiting). (Patient not taking: Reported on 08/28/2021) 30 tablet 1   Current Facility-Administered Medications on File Prior to Encounter  Medication Dose Route Frequency Provider Last Rate Last Admin   0.9 %  sodium chloride infusion   Intravenous Continuous Sindy Guadeloupe, MD   Stopped at 09/09/20 1507   0.9 %  sodium chloride infusion   Intravenous Continuous Cammie Sickle, MD   Stopped at 09/11/20 1502   0.9 %  sodium chloride infusion   Intravenous Continuous Sindy Guadeloupe, MD   Stopped at 11/06/20 1424   heparin lock flush 100 unit/mL  500 Units Intravenous Once Sindy Guadeloupe, MD       methotrexate (PF) 12 mg in sodium chloride (PF) 0.9 % INTRATHECAL chemo injection   Intrathecal Once Sindy Guadeloupe, MD       sodium chloride flush (NS) 0.9 % injection 10 mL  10 mL Intravenous PRN Sindy Guadeloupe, MD   10 mL at 11/18/21 0910   sodium chloride flush (NS) 0.9 % injection 10 mL  10 mL Intracatheter PRN Sindy Guadeloupe, MD   10 mL at 12/09/21 0843    PSH:  Past Surgical History:  Procedure Laterality Date   BONE MARROW BIOPSY     COLONOSCOPY     EXCISION MASS NECK Right 06/12/2020  Procedure: EXCISION MASS NECK;  Surgeon: Jules Husbands, MD;  Location: [facility] ORS;  Service: General;  Laterality: Right;   HERNIA REPAIR Right    at age 40-RIH   PORTA [place] Right 06/24/2020   Procedure: INSERTION PORT-A-CATH;  Surgeon: Jules Husbands, MD;  Location: [facility] ORS;  Service: General;  Laterality: Right;    PMH:  Past Medical History:  Diagnosis Date   Acute kidney injury ([place]) 07/12/2020   Dyspnea    Hypertension    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 [phone] Clinic. [phone]   I spent a total of 25 Minutes of face-to-face time in clinical consultation, greater than 50% of which was counseling/coordinating care for Mr WOODY KRONBERG evaluation for bone marrow biopsy for DLBCL.

## 2022-03-09 NOTE — Procedures (Signed)
Vascular and Interventional Radiology Procedure Note  Patient: ELISEO WITHERS DOB: [birth year] Medical Record Number: [id number] Note Date/Time: 03/09/22 8:34 AM   Performing Physician: Michaelle Birks, MD Assistant(s): None  Diagnosis: DLBCL. Restaging.   Procedure: BONE MARROW ASPIRATION and BIOPSY  Anesthesia: Conscious Sedation Complications: None Estimated Blood Loss: Minimal Specimens: Sent for Pathology  Findings:  Successful CT-guided bone marrow aspiration and biopsy A total of 2 cores were obtained. Hemostasis of the tract was achieved using Manual Pressure.  Plan: Bed rest for 1 hours.  See detailed procedure note with images in PACS. The patient tolerated the procedure well without incident or complication and was returned to Recovery in stable condition.    Michaelle Birks, MD Vascular and Interventional Radiology Specialists [facility] Radiology   Pager. [place]

## 2022-03-09 NOTE — Discharge Instructions (Signed)
Bone Marrow Aspiration and Bone Marrow Biopsy, Adult, Care After This sheet gives you information about how to care for yourself after your procedure. If you have problems or questions, contact your health care provider.  What can I expect after the procedure?  After the procedure, it is common to have: Mild pain and tenderness. Swelling. Bruising.  Follow these instructions at home: Take over-the-counter or prescription medicines only as told by your health care provider. You may shower tomorrow Remove band aid tomorrow, replace with another bandaid if  site has any drainage from biopsy site. Wash your hands with soap and water before you touch your biopsy site  If soap and water are not available, use hand sanitizer. Change your dressing frequently for bleeding and/or drainage. Check your puncture site every day for signs of infection. Check for: More redness, swelling, or pain. More fluid or blood. Warmth. Pus or a bad smell. Return to your normal activities in 24hours.  Do not drive for 24 hours if you were given a medicine to help you relax (sedative). Keep all follow-up visits as told by your health care provider. This is important. Contact a health care provider if: You have more redness, swelling, or pain around the puncture site. You have more fluid or blood coming from the puncture site. Your puncture site feels warm to the touch. You have pus or a bad smell coming from the puncture site. You have a fever. Your pain is not controlled with medicine. This information is not intended to replace advice given to you by your health care provider. Make sure you discuss any questions you have with your health care provider. Document Released: 09/18/2004 Document Revised: 09/19/2015 Document Reviewed: 08/13/2015 Elsevier Interactive Patient Education  2018 [employer] 

## 2022-03-10 ENCOUNTER — Telehealth: Payer: Self-pay

## 2022-03-10 ENCOUNTER — Encounter: Payer: Self-pay | Admitting: Oncology

## 2022-03-10 ENCOUNTER — Other Ambulatory Visit (HOSPITAL_COMMUNITY): Payer: Self-pay

## 2022-03-10 LAB — SURGICAL PATHOLOGY

## 2022-03-10 NOTE — Telephone Encounter (Signed)
Oral Oncology Patient Advocate Encounter   Received notification that prior authorization for Shirlee Latch is required.   PA submitted on 03/10/22  Key [id number]  Status is pending     Berdine Addison, [place] Patient [place]  [phone] (phone) [phone] (fax) 03/10/2022 4:01 PM

## 2022-03-11 ENCOUNTER — Institutional Professional Consult (permissible substitution): Payer: Medicaid Other | Admitting: Radiation Oncology

## 2022-03-11 NOTE — Telephone Encounter (Signed)
Oral Oncology Patient Advocate Encounter  Received notification that the request for prior authorization for [facility] Mills has been denied due to patient not trying and failing two other preferred alternatives.     Berdine Addison, [facility] [facility] Patient [place]  [phone] (phone) [phone] (fax) 03/11/2022 8:28 AM

## 2022-03-16 ENCOUNTER — Encounter (HOSPITAL_COMMUNITY): Payer: Self-pay | Admitting: Oncology

## 2022-03-16 MED FILL — Dexamethasone Sodium Phosphate Inj 100 MG/10ML: INTRAMUSCULAR | Qty: 1 | Status: AC

## 2022-03-17 ENCOUNTER — Inpatient Hospital Stay (HOSPITAL_BASED_OUTPATIENT_CLINIC_OR_DEPARTMENT_OTHER): Payer: Medicaid Other | Admitting: Oncology

## 2022-03-17 ENCOUNTER — Inpatient Hospital Stay: Payer: Medicaid Other | Attending: Oncology

## 2022-03-17 ENCOUNTER — Encounter: Payer: Self-pay | Admitting: Oncology

## 2022-03-17 ENCOUNTER — Inpatient Hospital Stay: Payer: Medicaid Other

## 2022-03-17 VITALS — BP 120/90 | HR 70 | Temp 96.0°F | Resp 16 | Wt 182.5 lb

## 2022-03-17 DIAGNOSIS — D701 Agranulocytosis secondary to cancer chemotherapy: Secondary | ICD-10-CM

## 2022-03-17 DIAGNOSIS — Z809 Family history of malignant neoplasm, unspecified: Secondary | ICD-10-CM | POA: Diagnosis not present

## 2022-03-17 DIAGNOSIS — Z87891 Personal history of nicotine dependence: Secondary | ICD-10-CM | POA: Insufficient documentation

## 2022-03-17 DIAGNOSIS — R0602 Shortness of breath: Secondary | ICD-10-CM | POA: Insufficient documentation

## 2022-03-17 DIAGNOSIS — Z7961 Long term (current) use of immunomodulator: Secondary | ICD-10-CM | POA: Diagnosis not present

## 2022-03-17 DIAGNOSIS — Z832 Family history of diseases of the blood and blood-forming organs and certain disorders involving the immune mechanism: Secondary | ICD-10-CM | POA: Insufficient documentation

## 2022-03-17 DIAGNOSIS — Z8269 Family history of other diseases of the musculoskeletal system and connective tissue: Secondary | ICD-10-CM | POA: Diagnosis not present

## 2022-03-17 DIAGNOSIS — C8331 Diffuse large B-cell lymphoma, lymph nodes of head, face, and neck: Secondary | ICD-10-CM | POA: Insufficient documentation

## 2022-03-17 DIAGNOSIS — C8510 Unspecified B-cell lymphoma, unspecified site: Secondary | ICD-10-CM

## 2022-03-17 DIAGNOSIS — T451X5A Adverse effect of antineoplastic and immunosuppressive drugs, initial encounter: Secondary | ICD-10-CM

## 2022-03-17 DIAGNOSIS — Z8249 Family history of ischemic heart disease and other diseases of the circulatory system: Secondary | ICD-10-CM | POA: Diagnosis not present

## 2022-03-17 DIAGNOSIS — Z8349 Family history of other endocrine, nutritional and metabolic diseases: Secondary | ICD-10-CM | POA: Diagnosis not present

## 2022-03-17 DIAGNOSIS — Z79899 Other long term (current) drug therapy: Secondary | ICD-10-CM | POA: Diagnosis not present

## 2022-03-17 LAB — CBC WITH DIFFERENTIAL/PLATELET
Abs Immature Granulocytes: 0.02 10*3/uL (ref 0.00–0.07)
Basophils Absolute: 0 10*3/uL (ref 0.0–0.1)
Basophils Relative: 2 %
Eosinophils Absolute: 0.1 10*3/uL (ref 0.0–0.5)
Eosinophils Relative: 5 %
HCT: 36.9 % — ABNORMAL LOW (ref 39.0–52.0)
Hemoglobin: 13.3 g/dL (ref 13.0–17.0)
Immature Granulocytes: 1 %
Lymphocytes Relative: 27 %
Lymphs Abs: 0.6 10*3/uL — ABNORMAL LOW (ref 0.7–4.0)
MCH: 31.4 pg (ref 26.0–34.0)
MCHC: 36 g/dL (ref 30.0–36.0)
MCV: 87.2 fL (ref 80.0–100.0)
Monocytes Absolute: 0.2 10*3/uL (ref 0.1–1.0)
Monocytes Relative: 9 %
Neutro Abs: 1.3 10*3/uL — ABNORMAL LOW (ref 1.7–7.7)
Neutrophils Relative %: 56 %
Platelets: 215 10*3/uL (ref 150–400)
RBC: 4.23 MIL/uL (ref 4.22–5.81)
RDW: 12.4 % (ref 11.5–15.5)
WBC: 2.4 10*3/uL — ABNORMAL LOW (ref 4.0–10.5)
nRBC: 0 % (ref 0.0–0.2)

## 2022-03-17 LAB — COMPREHENSIVE METABOLIC PANEL
ALT: 20 U/L (ref 0–44)
AST: 31 U/L (ref 15–41)
Albumin: 3.9 g/dL (ref 3.5–5.0)
Alkaline Phosphatase: 77 U/L (ref 38–126)
Anion gap: 7 (ref 5–15)
BUN: 13 mg/dL (ref 8–23)
CO2: 26 mmol/L (ref 22–32)
Calcium: 8.8 mg/dL — ABNORMAL LOW (ref 8.9–10.3)
Chloride: 104 mmol/L (ref 98–111)
Creatinine, Ser: 0.86 mg/dL (ref 0.61–1.24)
GFR, Estimated: >60 mL/min (ref >60)
Glucose, Bld: 110 mg/dL — ABNORMAL HIGH (ref 70–99)
Potassium: 3.5 mmol/L (ref 3.5–5.1)
Sodium: 137 mmol/L (ref 135–145)
Total Bilirubin: 0.5 mg/dL (ref 0.3–1.2)
Total Protein: 6.7 g/dL (ref 6.5–8.1)

## 2022-03-17 LAB — LACTATE DEHYDROGENASE: LDH: 241 U/L — ABNORMAL HIGH (ref 98–192)

## 2022-03-17 MED ORDER — HEPARIN SOD (PORK) LOCK FLUSH 100 UNIT/ML IV SOLN
500.0000 [IU] | Freq: Once | INTRAVENOUS | Status: AC
  Administered 2022-03-17: 500 [IU] via INTRAVENOUS
  Filled 2022-03-17: qty 5

## 2022-03-17 NOTE — Progress Notes (Signed)
Pt in for follow up, denies any concerns or difficulties today. 

## 2022-03-17 NOTE — Progress Notes (Signed)
No treatment today, port deaccessed. Discharged, stable  

## 2022-03-17 NOTE — Progress Notes (Signed)
Hematology/Oncology Consult note 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  This was followed by a PET CT scan which showed extensive FDG avid adenopathy in the neck chest abdomen and pelvis.  FDG avid splenic lesions.  Solitary intramuscular FDG avid lesion in the right biceps femoris muscle.   Supraclavicular excisional lymph node biopsy showed high-grade B-cell lymphoma germinal center type Ki-67 greater than 95%.  FISH testing was positive for Bcl-2 BCL6 and MYC consistent with triple hit lymphoma.  By NCCN IPI score would be 4 based on age and elevated LDH and stage IV (intramuscular biceps femoris lesion) which puts him in the high intermediate risk group. 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   Patient received RCHOP for cycle 1 with plans for DA [facility] with cycle 2. IT MTX for CNS prophylaxis and he has received 3 cycles but declined further cycles.  MRI brain negative for lymphoma.  Chemotherapy was not being able to escalated upwards due to neutropenia which has persisted to 3 weeks and he has been receiving treatment  every 4 weeks   PET CT scan after 3 cycles of chemotherapy showedComplete or near complete metabolic response.  Residual disease in the anterior upper right mediastinum slightly greater than background mediastinal activity   Patient found to have a clinically palpable right supraclavicular lymph node which was subsequently biopsied and was consistent with previously diagnosed lymphoma.  PET CT scan showedNew right level 5 adenopathy in the lower neck with dominant lymph node 1.6 cm Deauville 5.  No other findings of acute active lymphoma in chest abdomen pelvis and bones.  MRI brain negative for leptomeningeal disease.   Patient seen for second opinion by Dr. Lonia Blood at [facility] for consideration of CAR-T cell therapy.  Patient did not wish to proceed with CAR-T cell treatment after reviewing risks versus benefits.  Moreover patient's health insurance will also not cover CAR-T cell therapy. Patient started on second line tafasitamab/Revlimid regimen in June 2023.  He is on Revlimid 15 mg 3 weeks on and 1 week off.  Disease progression after 3 cycles and patient was switched to [facility] regimen     Patient was noted to haveSignificant decrease in the size of supraclavicular lymph nodes consistent with treatment response Deauville 3 after 3 cycles of Pola BR chemotherapy.  Patient has only received 4 cycles of chemotherapy so far due to persistent neutropenia despite receiving growth factor support.  Interval history-patient feels well overall and denies any specific complaints at this time.  Appetite and weight have remained stable.  He has not had any repeated infections.  ECOG PS- 0 Pain scale- 0   Review of systems- Review of Systems  Constitutional:  Negative for chills, fever, malaise/fatigue and weight loss.  HENT:  Negative for congestion, ear discharge and nosebleeds.   Eyes:  Negative for blurred vision.  Respiratory:  Negative for cough, hemoptysis, sputum production, shortness of breath and  wheezing.   Cardiovascular:  Negative for chest pain, palpitations, orthopnea and claudication.  Gastrointestinal:  Negative for abdominal pain, blood in stool, constipation, diarrhea, heartburn, melena, nausea and vomiting.  Genitourinary:  Negative for dysuria, flank pain, frequency, hematuria and urgency.  Musculoskeletal:  Negative for back pain, joint pain and myalgias.  Skin:  Negative for rash.  Neurological:  Negative for dizziness, tingling, focal weakness, seizures, weakness and headaches.  Endo/Heme/Allergies:  Does not bruise/bleed easily.  Psychiatric/Behavioral:  Negative for depression and suicidal ideas. The patient does not have insomnia.       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     Past Surgical History:  Procedure Laterality Date   BONE MARROW BIOPSY     COLONOSCOPY     EXCISION MASS NECK Right 06/12/2020   Procedure: EXCISION MASS NECK;  Surgeon: Jules Husbands, MD;  Location: [facility] ORS;  Service: General;  Laterality: Right;   HERNIA REPAIR Right    at age 20-RIH   74 [place] Right 06/24/2020   Procedure: INSERTION PORT-A-CATH;  Surgeon: Jules Husbands, MD;  Location: [facility] ORS;  Service: General;  Laterality: Right;    Social History   Socioeconomic History   Marital status: Single    Spouse name: Not on file   Number of children: Not on file   Years of education: Not on file   Highest education level: Not on file  Occupational History   Not on file  Tobacco Use   Smoking status: Former    Types: Cigarettes    Quit date: 06/13/2020    Years since quitting: 1.7   Smokeless tobacco: Never  Vaping Use   Vaping Use: Never used  Substance and Sexual Activity   Alcohol use: Not Currently   Drug use: Not Currently    Types: Marijuana   Sexual activity: Not Currently  Other  Topics Concern   Not on file  Social History Narrative   Has daughter and grandson in the home   Social Determinants of Health   Financial Resource Strain: Not on file  Food Insecurity: Not on file  Transportation Needs: Not on file  Physical Activity: Not on file  Stress: Not on file  Social Connections: Not on file  Intimate Partner Violence: Not on file    Family History  Problem Relation Age of Onset   Anemia Mother    Hypertension Mother    Goiter Mother    Cancer Father    Cancer Sister    Multiple sclerosis Sister      Current Outpatient Medications:    acyclovir (ZOVIRAX) 400 MG tablet, Take 1 tablet (400 mg total) by mouth 2 (two) times daily., Disp: 180 tablet, Rfl: 1   aspirin EC 81 MG tablet, Take 1 tablet (81 mg total) by mouth daily. Swallow whole., Disp: 30 tablet, Rfl: 0   atenolol (TENORMIN) 25 MG tablet, Take 25 mg by mouth daily., Disp: , Rfl:    cholecalciferol (VITAMIN D3) 25 MCG (1000 UNIT) tablet, Take 1,000 Units by mouth daily., Disp: ,  Rfl:    lisinopril (ZESTRIL) 10 MG tablet, Take 10 mg by mouth daily., Disp: , Rfl:    Omega-3 Fatty Acids (FISH OIL ADULT GUMMIES PO), Take by mouth., Disp: , Rfl:    vitamin C (ASCORBIC ACID) 500 MG tablet, Take 500 mg by mouth daily., Disp: , Rfl:    allopurinol (ZYLOPRIM) 300 MG tablet, TAKE 1 TABLET BY MOUTH EVERY DAY (Patient not taking: Reported on 03/09/2022), Disp: 30 tablet, Rfl: 1   Tenofovir Alafenamide Fumarate (VEMLIDY) 25 MG TABS, Take 1 tablet (25 mg total) by mouth daily. Take with food. (Patient not taking: Reported on 03/09/2022), Disp: 30 tablet, Rfl: 2 No current facility-administered medications for this visit.  Facility-Administered Medications Ordered in Other Visits:    0.9 %  sodium chloride infusion, , Intravenous, Continuous, Sindy Guadeloupe, MD, Stopped at 09/09/20 1507   0.9 %  sodium chloride infusion, , Intravenous, Continuous, Cammie Sickle, MD, Stopped at 09/11/20 1502   0.9 %   sodium chloride infusion, , Intravenous, Continuous, Sindy Guadeloupe, MD, Stopped at 11/06/20 1424   heparin lock flush 100 unit/mL, 500 Units, Intravenous, Once, Sindy Guadeloupe, MD   methotrexate (PF) 12 mg in sodium chloride (PF) 0.9 % INTRATHECAL chemo injection, , Intrathecal, Once, Sindy Guadeloupe, MD   sodium chloride flush (NS) 0.9 % injection 10 mL, 10 mL, Intravenous, PRN, Sindy Guadeloupe, MD, 10 mL at 11/18/21 0910   sodium chloride flush (NS) 0.9 % injection 10 mL, 10 mL, Intracatheter, PRN, Sindy Guadeloupe, MD, 10 mL at 12/09/21 0843  Physical exam:  Vitals:   03/17/22 0849  BP: (!) 120/90  Pulse: 70  Resp: 16  Temp: (!) 96 F (35.6 C)  TempSrc: Tympanic  SpO2: 100%  Weight: 182 lb 8 oz (82.8 kg)   Physical Exam Cardiovascular:     Rate and Rhythm: Normal rate and regular rhythm.     Heart sounds: Normal heart sounds.  Pulmonary:     Effort: Pulmonary effort is normal.     Breath sounds: Normal breath sounds.  Abdominal:     General: Bowel sounds are normal.     Palpations: Abdomen is soft.  Skin:    General: Skin is warm and dry.  Neurological:     Mental Status: He is alert and oriented to person, place, and time.         Latest Ref Rng & Units 03/17/2022    8:05 AM  CMP  Glucose 70 - 99 mg/dL 110   BUN 8 - 23 mg/dL 13   Creatinine 0.61 - 1.24 mg/dL 0.86   Sodium 135 - 145 mmol/L 137   Potassium 3.5 - 5.1 mmol/L 3.5   Chloride 98 - 111 mmol/L 104   CO2 22 - 32 mmol/L 26   Calcium 8.9 - 10.3 mg/dL 8.8   Total Protein 6.5 - 8.1 g/dL 6.7   Total Bilirubin 0.3 - 1.2 mg/dL 0.5   Alkaline Phos 38 - 126 U/L 77   AST 15 - 41 U/L 31   ALT 0 - 44 U/L 20       Latest Ref Rng & Units 03/17/2022    8:05 AM  CBC  WBC 4.0 - 10.5 K/uL 2.4   Hemoglobin 13.0 - 17.0 g/dL 13.3   Hematocrit 39.0 - 52.0 % 36.9   Platelets 150 - 400 K/uL 215     No images are attached to the encounter.  CT BONE MARROW BIOPSY & ASPIRATION  Result Date: 03/09/2022 INDICATION: Diffuse  large B-cell lymphoma. Restaging. Prior bone marrow biopsy 06/25/2020. EXAM: CT GUIDED BONE MARROW ASPIRATION AND CORE BIOPSY MEDICATIONS: None. ANESTHESIA/SEDATION: Moderate (conscious) sedation was employed during this procedure. A total of Versed 1 mg and Fentanyl 25 mcg was administered intravenously. Moderate Sedation Time: 14 minutes. The patient's level of consciousness and vital signs were monitored continuously by radiology nursing throughout the procedure under my direct supervision. FLUOROSCOPY TIME:  CT dose in mGy was not provided. COMPLICATIONS: None immediate. Estimated blood loss: <5 mL PROCEDURE: RADIATION DOSE REDUCTION: This exam was performed according to the departmental dose-optimization program which includes automated exposure control, adjustment of the mA and/or kV according to patient size and/or use of iterative reconstruction technique. Informed written consent was obtained from the patient after a thorough discussion of the procedural risks, benefits and alternatives. All questions were addressed. Maximal Sterile Barrier Technique was utilized including caps, mask, sterile gowns, sterile gloves, sterile drape, hand hygiene and skin antiseptic. A timeout was performed prior to the initiation of the procedure. The patient was positioned prone and non-contrast localization CT was performed of the pelvis to demonstrate the iliac marrow spaces. Maximal barrier sterile technique utilized including caps, mask, sterile gowns, sterile gloves, large sterile drape, hand hygiene, and chlorhexidine prep. Under sterile conditions and local anesthesia, an 11 gauge coaxial bone biopsy needle was advanced into the RIGHT iliac marrow space. Needle position was confirmed with CT imaging. Initially, bone marrow aspiration was performed. Next, the 11 gauge outer cannula was utilized to obtain a 2 iliac bone marrow core biopsy. Needle was removed. Hemostasis was obtained with compression. The patient  tolerated the procedure well. Samples were prepared with the cytotechnologist. IMPRESSION: Successful CT-guided bone marrow aspiration and biopsy, as above. Michaelle Birks, MD [facility] Electronically Signed   By: Michaelle Birks M.D.   On: 03/09/2022 16:27   NM PET Image Restag (PS) Skull Base To Thigh  Addendum Date: 03/05/2022   ADDENDUM REPORT: 03/05/2022 17:02 ADDENDUM: Correction. New / recurrent hypermetabolic subcutaneous tissue in the high RIGHT back/inferior neck. Finding mis identified as LEFT neck in the finding and impression section of the original report. Electronically Signed   By: Suzy Bouchard M.D.   On: 03/05/2022 17:02   Result Date: 03/05/2022 CLINICAL DATA:  Subsequent treatment strategy for B-cell lymphoma. EXAM: NUCLEAR MEDICINE PET SKULL BASE TO THIGH TECHNIQUE: 9.52 mCi F-18 FDG was injected intravenously. Full-ring PET imaging was performed from the skull base to thigh after the radiotracer. CT data was obtained and used for attenuation correction and anatomic localization. Fasting blood glucose: 80 mg/dl COMPARISON:  None Available. FINDINGS: Mediastinal blood pool activity: SUV max 1.7 Liver activity: SUV max 2.7 NECK: There is a new/increased hypermetabolic focus in the posterior LEFT neck just above the trapezius musculature with SUV max equal 5.6 (image 52). There is a small subcutaneous lesion at this level measuring 11 mm (51/series 2) This tissue appears to correspond to soft tissue nodule measuring 12 mm on comparison exam with very low metabolic activity (SUV max equal 1.2) No additional hypermetabolic lymph nodes in LEFT or RIGHT neck. No hypermetabolic axillary or mediastinal adenopathy. No suspicious pulmonary nodules. Incidental CT findings: None. CHEST: No hypermetabolic mediastinal or hilar nodes. No suspicious pulmonary nodules on the CT scan. Incidental CT findings: None. ABDOMEN/PELVIS: No abnormal  hypermetabolic activity within the liver, pancreas, adrenal glands, or spleen. No hypermetabolic lymph nodes in the abdomen or pelvis. Incidental CT  findings: None. SKELETON: No focal hypermetabolic activity to suggest skeletal metastasis. Incidental CT findings: None. IMPRESSION: 1. New or recurrent hypermetabolic subcutaneous tissue in the high LEFT back/inferior neck. Findings most consistent with lymphoma recurrence. Deauville 5 2. No evidence of additional lymphoma recurrence in the RIGHT neck. 3. No distant lymphoma recurrence in the chest abdomen pelvis. These results will be called to the ordering clinician or representative by the Radiologist Assistant, and communication documented in the PACS or [employer]. Electronically Signed: By: Suzy Bouchard M.D. On: 03/05/2022 09:06     Assessment and plan- Patient is a 62 y.o. 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  He is here to discuss PET scan and bone marrow biopsy results and further management  Patient has not received cycle 5 of Pola BR chemotherapy for about 2 months now.  White cell count is 2.4 today and finally his ANC is 1.3 despite receiving Udenyca with cycle 4.  Bone marrow biopsy does not show any evidence of lymphoma.  I have reviewed PET CT scan images independently and discussed findings with the patient which does not show any evidence of hypermetabolic lymph nodes in his bilateral neck or elsewhere.  There was a new hypermetabolic focus in the posterior left neck about the trapezius muscle with an SUV of 5.6 measuring 11 mm which probably corresponds to a soft tissue nodule on the prior PET measuring 12 mm.  At this time although I could proceed with [facility] BR chemotherapy at a reduced dose given that his [place] is greater than 1, I am concerned about prolonged cytopenias and potential infectious complications.  I am referring him to radiation oncology to offer SBRT to the hypermetabolic  area noted on the PET scan.  I will plan to repeat his PET scan again in 3 months time.  If he has good response to treatment I will hold off on Pola BR chemotherapy for now given his ongoing cytopenias.  Patient verbalized understanding of the plan.  I will see him back in 6 weeks with labs   Visit Diagnosis 1. Chemotherapy induced neutropenia (HCC)   2. High grade B-cell lymphoma ([place])      Dr. Randa Evens, MD, MPH [facility] at [facility] [phone] 03/17/2022 12:27 PM

## 2022-03-18 ENCOUNTER — Ambulatory Visit
Admission: RE | Admit: 2022-03-18 | Discharge: 2022-03-18 | Disposition: A | Discharge status: Home / Self Care | Payer: Medicaid Other | Source: Ambulatory Visit | Attending: Radiation Oncology | Admitting: Radiation Oncology

## 2022-03-18 ENCOUNTER — Encounter: Payer: Self-pay | Admitting: Radiation Oncology

## 2022-03-18 ENCOUNTER — Inpatient Hospital Stay: Payer: Medicaid Other

## 2022-03-18 ENCOUNTER — Inpatient Hospital Stay: Payer: Medicaid Other | Admitting: Oncology

## 2022-03-18 ENCOUNTER — Other Ambulatory Visit: Payer: Self-pay

## 2022-03-18 VITALS — BP 129/74 | HR 81 | Temp 95.8°F | Resp 14 | Ht 73.0 in | Wt 183.3 lb

## 2022-03-18 DIAGNOSIS — Z510 Encounter for antineoplastic radiation therapy: Secondary | ICD-10-CM | POA: Insufficient documentation

## 2022-03-18 DIAGNOSIS — C8510 Unspecified B-cell lymphoma, unspecified site: Secondary | ICD-10-CM | POA: Insufficient documentation

## 2022-03-18 DIAGNOSIS — C8338 Diffuse large B-cell lymphoma, lymph nodes of multiple sites: Secondary | ICD-10-CM

## 2022-03-18 NOTE — Consult Note (Signed)
NEW PATIENT EVALUATION  Name: Christopher Mills  MRN: [id number]  Date:   03/18/2022     DOB: [birth year]   This 62 y.o. 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  REFERRING PHYSICIAN: [name], MD  CHIEF COMPLAINT:  Chief Complaint  Patient presents with   Lymphoma    Consult    DIAGNOSIS: There were no encounter diagnoses.   PREVIOUS INVESTIGATIONS:  PET CT scans reviewed Clinical notes reviewed Histology reviewed  HPI: Patient is a 62 year old male originally presented with shortness of breath had a right paratracheal mass measuring 6.4 x 4.7 cm along with adenopathy in the upper abdomen concerning for lymphoma.  Extensive PET CT scan showed extensive hypermetabolic activity within the neck chest abdomen and pelvis with also hypermetabolic splenic lesions consistent with left low proliferative disorder.  Supraclavicular lymph node biopsy showed high-grade B-cell lymphoma Ki-67 greater than 95% with all markers consistent with triple hit lymphoma.  Based on these factors he was a high intermediate risk group.  Bone marrow biopsy showed involvement low-grade B-cell lymphoma left low proliferative disorder patient is undergone R-CHOP and after 3 cycles showed complete or near complete response.  He developed right supraclavicular adenopathy PET scan showed right level 5 adenopathy in the lower neck and right supraclavicular fossa with biopsy consistent with previously diagnosed lymphoma.  Patient has been onsecond line tafasitamab/Revlimid regimen in June 2023.  He is on Revlimid 15 mg 3 weeks on and 1 week off.  Disease progression after 3 cycles and patient was switched to [facility] BR regimen.  He is now referred to radiation collagen for consideration of involved field treatment.  He is having no B symptoms at this time.  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    PAST SURGICAL HISTORY:  Past Surgical History:  Procedure Laterality Date   BONE MARROW BIOPSY     COLONOSCOPY     EXCISION MASS NECK Right 06/12/2020   Procedure: EXCISION MASS NECK;  Surgeon: Jules Husbands, MD;  Location: [facility] ORS;  Service: General;  Laterality: Right;   HERNIA REPAIR Right    at age 54-RIH   PORTA [place] Right 06/24/2020   Procedure: INSERTION PORT-A-CATH;  Surgeon: Jules Husbands, MD;  Location: [facility] ORS;  Service: General;  Laterality: Right;    FAMILY HISTORY: family history includes Anemia in his mother; Cancer in his father and sister; Goiter in his mother; Hypertension in his mother; Multiple sclerosis in his sister.  SOCIAL HISTORY:  reports that he quit smoking about 21 months ago. His smoking use included cigarettes. He has never used smokeless tobacco. He reports that he does not currently use alcohol. He reports that he does not currently use drugs after having used the following drugs: Marijuana.  ALLERGIES: Patient has no known allergies.  MEDICATIONS:  Current Outpatient Medications  Medication Sig Dispense Refill   acyclovir (ZOVIRAX) 400 MG tablet Take 1 tablet (400 mg total) by mouth 2 (two) times daily. 180 tablet 1   allopurinol (ZYLOPRIM) 300 MG tablet TAKE 1 TABLET BY MOUTH EVERY DAY (Patient not taking: Reported on 03/09/2022) 30 tablet 1  aspirin EC 81 MG tablet Take 1 tablet (81 mg total) by mouth daily. Swallow whole. 30 tablet 0   atenolol (TENORMIN) 25 MG tablet Take 25 mg by mouth daily.     cholecalciferol (VITAMIN D3) 25 MCG (1000 UNIT) tablet Take 1,000 Units by mouth daily.     lisinopril (ZESTRIL) 10 MG tablet Take 10 mg by mouth daily.      Omega-3 Fatty Acids (FISH OIL ADULT GUMMIES PO) Take by mouth.     Tenofovir Alafenamide Fumarate (VEMLIDY) 25 MG TABS Take 1 tablet (25 mg total) by mouth daily. Take with food. (Patient not taking: Reported on 03/09/2022) 30 tablet 2   vitamin C (ASCORBIC ACID) 500 MG tablet Take 500 mg by mouth daily.     No current facility-administered medications for this encounter.   Facility-Administered Medications Ordered in Other Encounters  Medication Dose Route Frequency Provider Last Rate Last Admin   0.9 %  sodium chloride infusion   Intravenous Continuous Sindy Guadeloupe, MD   Stopped at 09/09/20 1507   0.9 %  sodium chloride infusion   Intravenous Continuous Cammie Sickle, MD   Stopped at 09/11/20 1502   0.9 %  sodium chloride infusion   Intravenous Continuous Sindy Guadeloupe, MD   Stopped at 11/06/20 1424   heparin lock flush 100 unit/mL  500 Units Intravenous Once Sindy Guadeloupe, MD       methotrexate (PF) 12 mg in sodium chloride (PF) 0.9 % INTRATHECAL chemo injection   Intrathecal Once Sindy Guadeloupe, MD       sodium chloride flush (NS) 0.9 % injection 10 mL  10 mL Intravenous PRN Sindy Guadeloupe, MD   10 mL at 11/18/21 0910   sodium chloride flush (NS) 0.9 % injection 10 mL  10 mL Intracatheter PRN Sindy Guadeloupe, MD   10 mL at 12/09/21 0843    ECOG PERFORMANCE STATUS:  0 - Asymptomatic  REVIEW OF SYSTEMS: Patient denies any weight loss, fatigue, weakness, fever, chills or night sweats. Patient denies any loss of vision, blurred vision. Patient denies any ringing  of the ears or hearing loss. No irregular heartbeat. Patient denies heart murmur or history of fainting. Patient denies any chest pain or pain radiating to her upper extremities. Patient denies any shortness of breath, difficulty breathing at night, cough or hemoptysis. Patient denies any swelling in the lower legs. Patient denies any nausea vomiting, vomiting of blood, or coffee ground material in the vomitus. Patient denies  any stomach pain. Patient states has had normal bowel movements no significant constipation or diarrhea. Patient denies any dysuria, hematuria or significant nocturia. Patient denies any problems walking, swelling in the joints or loss of balance. Patient denies any skin changes, loss of hair or loss of weight. Patient denies any excessive worrying or anxiety or significant depression. Patient denies any problems with insomnia. Patient denies excessive thirst, polyuria, polydipsia. Patient denies any swollen glands, patient denies easy bruising or easy bleeding. Patient denies any recent infections, allergies or URI. Patient "s visual fields have not changed significantly in recent time.   PHYSICAL EXAM: BP 129/74   Pulse 81   Temp (!) 95.8 F (35.4 C)   Resp 14   Ht 6' 1" (1.854 m)   Wt 183 lb 4.8 oz (83.1 kg)   BMI 24.18 kg/m  Patient has palpable nodule in the right posterior lower neck as well as shotty adenopathy in the right supraclavicular fossa.  Well-developed well-nourished patient  in NAD. HEENT reveals PERLA, EOMI, discs not visualized.  Oral cavity is clear. No oral mucosal lesions are identified. Neck is clear without evidence of cervical or supraclavicular adenopathy. Lungs are clear to A&P. Cardiac examination is essentially unremarkable with regular rate and rhythm without murmur rub or thrill. Abdomen is benign with no organomegaly or masses noted. Motor sensory and DTR levels are equal and symmetric in the upper and lower extremities. Cranial nerves II through XII are grossly intact. Proprioception is intact. No peripheral adenopathy or edema is identified. No motor or sensory levels are noted. Crude visual fields are within normal range.  LABORATORY DATA: Pathology reports reviewed    RADIOLOGY RESULTS: Serial PET CT scans reviewed compatible with above-stated findings   IMPRESSION: Triple hit diffuse B-cell lymphoma with only area of persistent disease in right lower neck  subcutaneous nodule in 62 year old male  PLAN: This time elected ahead with involved field radiation therapy would cover the entire right supraclavicular fossa's right well as a new large nodule and treat to 30 Gray in 10 fractions.  Based the initial large size of tumor in this area feel should cover the entire right supraclavicular region based on his initial PET CT scan findings.  Risks and benefits of treatment including skin reaction fatigue alteration of blood counts all were discussed in detail with the patient I personally set up and ordered CT simulation for early next week.  Patient comprehends my recommendations well.  I would like to take this opportunity to thank you for allowing me to participate in the care of your patient.Noreene Filbert, MD

## 2022-03-19 ENCOUNTER — Inpatient Hospital Stay: Payer: Medicaid Other

## 2022-03-22 ENCOUNTER — Ambulatory Visit
Admission: RE | Admit: 2022-03-22 | Discharge: 2022-03-22 | Disposition: A | Discharge status: Home / Self Care | Payer: Medicaid Other | Source: Ambulatory Visit | Attending: Radiation Oncology | Admitting: Radiation Oncology

## 2022-03-22 DIAGNOSIS — Z510 Encounter for antineoplastic radiation therapy: Secondary | ICD-10-CM | POA: Diagnosis present

## 2022-03-22 DIAGNOSIS — C8510 Unspecified B-cell lymphoma, unspecified site: Secondary | ICD-10-CM | POA: Diagnosis present

## 2022-03-24 DIAGNOSIS — Z510 Encounter for antineoplastic radiation therapy: Secondary | ICD-10-CM | POA: Diagnosis not present

## 2022-03-26 ENCOUNTER — Other Ambulatory Visit (HOSPITAL_COMMUNITY): Payer: Self-pay

## 2022-03-26 ENCOUNTER — Telehealth: Payer: Self-pay | Admitting: *Deleted

## 2022-03-26 NOTE — Telephone Encounter (Signed)
Pt called to say that the lymph node that lit up on right neck, today he saw a red spot in that area on the outside of skin. He wanted to see if someone to look at it. He wants to know if maybe it is the node and it is coming outer part of skin or is it something else and the pt. Might not get radiation. I spoke to Janese Banks and she feels that he will still need radiation but getting a provider to see it in the absence of Dr. Janese Banks being out til Thursday of next week. I spoke to Dr. Tasia Catchings and she has an appt with Dr. Tasia Catchings on Tuesday 1/16 at 2:45. I did speak to Ranelle Oyster about this also. By the time I got everthing set up. Radiation had left. I spoke to [place] and told her about the above info. She cancelled the appt. For Mon, and tues. And made dry run for wed. I did tell Margreta Journey that if any thing changes after tues. appt. I would let them know. Pt aware that I have cancelled the radiation til we know about this spot.

## 2022-03-29 ENCOUNTER — Ambulatory Visit: Payer: Medicaid Other

## 2022-03-30 ENCOUNTER — Other Ambulatory Visit: Payer: Self-pay | Admitting: *Deleted

## 2022-03-30 ENCOUNTER — Ambulatory Visit: Payer: Medicaid Other

## 2022-03-30 ENCOUNTER — Encounter: Payer: Self-pay | Admitting: Oncology

## 2022-03-30 ENCOUNTER — Inpatient Hospital Stay (HOSPITAL_BASED_OUTPATIENT_CLINIC_OR_DEPARTMENT_OTHER): Payer: Medicaid Other | Admitting: Oncology

## 2022-03-30 DIAGNOSIS — C8331 Diffuse large B-cell lymphoma, lymph nodes of head, face, and neck: Secondary | ICD-10-CM | POA: Diagnosis not present

## 2022-03-30 DIAGNOSIS — C8510 Unspecified B-cell lymphoma, unspecified site: Secondary | ICD-10-CM | POA: Diagnosis not present

## 2022-03-30 NOTE — Assessment & Plan Note (Addendum)
PET scan results were reviewed and discussed with patient.  New Right posterior neck nodule with hypermetabolic activity- [place] 5 PET images were reviewed with patient.  Discussed with patient that I agree with Dr. Janese Banks that physical examination findings and PET scan results are concerning for recurrent lymphoma.  Dr. Janese Banks recommends radiation due to previous prolonged neutropenia after chemotherapy.  Patient appreicates explanation and will keep his radiation appointment.

## 2022-03-30 NOTE — Telephone Encounter (Signed)
I left a message for Christopher Mills to call me if he wants to but I had already spoke to him about if he was good with radiation then we will start tom. He will need to be at radiation area at 3:15.

## 2022-03-30 NOTE — Progress Notes (Signed)
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  This was followed by a PET CT scan which showed extensive FDG avid adenopathy in the neck chest abdomen and pelvis.  FDG avid splenic lesions.  Solitary intramuscular FDG avid lesion in the right biceps femoris muscle.   Supraclavicular excisional lymph node biopsy showed high-grade B-cell lymphoma germinal center type Ki-67 greater than 95%.  FISH testing was positive for Bcl-2 BCL6 and MYC consistent with triple hit lymphoma.  By NCCN IPI score would be 4 based on age and elevated LDH and stage IV (intramuscular biceps femoris lesion) which puts him in the high intermediate risk group. 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   Patient received [facility]CHOP for cycle 1 with plans for [facility] with cycle 2. IT MTX for CNS prophylaxis and he has received 3 cycles but declined further cycles.  MRI brain negative for lymphoma.  Chemotherapy was not being able to escalated upwards due to neutropenia which has persisted to 3 weeks and he has been receiving treatment every 4 weeks   PET CT scan  after 3 cycles of chemotherapy showedComplete or near complete metabolic response.  Residual disease in the anterior upper right mediastinum slightly greater than background mediastinal activity   Patient found to have a clinically palpable right supraclavicular lymph node which was subsequently biopsied and was consistent with previously diagnosed lymphoma.  PET CT scan showedNew right level 5 adenopathy in the lower neck with dominant lymph node 1.6 cm Deauville 5.  No other findings of acute active lymphoma in chest abdomen pelvis and bones.  MRI brain negative for leptomeningeal disease.   Patient seen for second opinion by Dr. Lonia Blood at [facility] for consideration of CAR-T cell therapy.  Patient did not wish to proceed with CAR-T cell treatment after reviewing risks versus benefits.  Moreover patient's health insurance will also not cover CAR-T cell therapy. Patient started on second line tafasitamab/Revlimid regimen in June 2023.  He is on Revlimid 15 mg 3 weeks on and 1 week off.  Disease progression after 3 cycles and patient was switched to [facility] BR regimen    Interval history- Patient follows up with Dr.Rao, I am covering her to see this patient today. Patient has right neck mass concerning for recurrent lymphoma. He has been recommended to start radiation. Patient called and would like to be re-evaluated prior to radiation. He feels that the neck mass may not be lymphoma.  Denies fever chills. He feels well today  ECOG PS- 0 Pain scale- 0   Review of systems- Review of Systems  Constitutional:  Negative for chills, fever, malaise/fatigue and weight loss.  HENT:  Negative for congestion, ear  discharge and nosebleeds.   Eyes:  Negative for blurred vision.  Respiratory:  Negative for cough, hemoptysis, sputum production, shortness of breath and wheezing.   Cardiovascular:  Negative for chest pain, palpitations, orthopnea and claudication.  Gastrointestinal:  Negative for abdominal pain, blood  in stool, constipation, diarrhea, heartburn, melena, nausea and vomiting.  Genitourinary:  Negative for dysuria, flank pain, frequency, hematuria and urgency.  Musculoskeletal:  Negative for back pain, joint pain and myalgias.  Skin:  Negative for rash.  Neurological:  Negative for dizziness, tingling, focal weakness, seizures, weakness and headaches.  Endo/Heme/Allergies:  Does not bruise/bleed easily.  Psychiatric/Behavioral:  Negative for depression and suicidal ideas. The patient does not have insomnia.       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     Past Surgical History:  Procedure Laterality Date   BONE MARROW BIOPSY     COLONOSCOPY     EXCISION MASS NECK Right 06/12/2020   Procedure: EXCISION MASS NECK;  Surgeon: Jules Husbands, MD;  Location: [facility] ORS;  Service: General;  Laterality: Right;   HERNIA REPAIR Right    at age 45-RIH   44 [place] Right 06/24/2020   Procedure: INSERTION PORT-A-CATH;  Surgeon: Jules Husbands, MD;  Location: [facility] ORS;  Service: General;  Laterality: Right;    Social History   Socioeconomic History   Marital status: Single    Spouse name: Not on file   Number of children: Not on file   Years of education: Not on file   Highest education level: Not on file  Occupational History   Not on file  Tobacco Use   Smoking status: Former    Types: Cigarettes    Quit date: 06/13/2020    Years since quitting: 1.7   Smokeless tobacco: Never  Vaping Use   Vaping Use: Never used  Substance and Sexual Activity   Alcohol use: Not Currently   Drug use: Not Currently    Types: Marijuana   Sexual activity: Not Currently  Other Topics Concern   Not on file  Social History Narrative   Has daughter and grandson in the home   Social Determinants of Health   Financial Resource  Strain: Not on file  Food Insecurity: Not on file  Transportation Needs: Not on file  Physical Activity: Not on file  Stress: Not on file  Social Connections: Not on file  Intimate Partner Violence: Not on file    Family History  Problem Relation Age of Onset   Anemia Mother    Hypertension Mother    Goiter Mother    Cancer Father    Cancer Sister    Multiple sclerosis Sister      Current Outpatient Medications:    acyclovir (ZOVIRAX) 400 MG tablet, Take 1 tablet (400 mg total) by mouth 2 (two) times daily., Disp: 180 tablet, Rfl: 1   allopurinol (ZYLOPRIM) 300 MG tablet, TAKE 1 TABLET BY MOUTH EVERY DAY, Disp: 30 tablet, Rfl: 1   aspirin EC 81 MG tablet, Take 1 tablet (81 mg total) by mouth daily. Swallow whole., Disp: 30 tablet, Rfl: 0   atenolol (TENORMIN) 25 MG tablet, Take 25 mg by mouth daily., Disp: , Rfl:    cholecalciferol (VITAMIN D3) 25 MCG (1000 UNIT) tablet, Take 1,000 Units by  mouth daily., Disp: , Rfl:    lisinopril (ZESTRIL) 10 MG tablet, Take 10 mg by mouth daily., Disp: , Rfl:    Omega-3 Fatty Acids (FISH OIL ADULT GUMMIES PO), Take by mouth., Disp: , Rfl:    vitamin C (ASCORBIC ACID) 500 MG tablet, Take 500 mg by mouth daily., Disp: , Rfl:    Tenofovir Alafenamide Fumarate (VEMLIDY) 25 MG TABS, Take 1 tablet (25 mg total) by mouth daily. Take with food. (Patient not taking: Reported on 03/09/2022), Disp: 30 tablet, Rfl: 2 No current facility-administered medications for this visit.  Facility-Administered Medications Ordered in Other Visits:    0.9 %  sodium chloride infusion, , Intravenous, Continuous, Sindy Guadeloupe, MD, Stopped at 09/09/20 1507   0.9 %  sodium chloride infusion, , Intravenous, Continuous, Cammie Sickle, MD, Stopped at 09/11/20 1502   0.9 %  sodium chloride infusion, , Intravenous, Continuous, Sindy Guadeloupe, MD, Stopped at 11/06/20 1424   heparin lock flush 100 unit/mL, 500 Units, Intravenous, Once, Sindy Guadeloupe, MD   methotrexate  (PF) 12 mg in sodium chloride (PF) 0.9 % INTRATHECAL chemo injection, , Intrathecal, Once, Sindy Guadeloupe, MD   sodium chloride flush (NS) 0.9 % injection 10 mL, 10 mL, Intravenous, PRN, Sindy Guadeloupe, MD, 10 mL at 11/18/21 0910   sodium chloride flush (NS) 0.9 % injection 10 mL, 10 mL, Intracatheter, PRN, Sindy Guadeloupe, MD, 10 mL at 12/09/21 0843  Physical exam:  Vitals:   03/30/22 1509  BP: 138/85  Pulse: 69  Resp: 18  Temp: (!) 97.1 F (36.2 C)  Weight: 183 lb 8 oz (83.2 kg)   Physical Exam Neck:     Comments: Right cervical mass, hard, non-fluctuating, non tender. There is another palpable small lymph node next to this mass. Palpable small supraclavicular lymphadenopathy  Cardiovascular:     Rate and Rhythm: Normal rate and regular rhythm.     Heart sounds: Normal heart sounds.  Pulmonary:     Effort: Pulmonary effort is normal.     Breath sounds: Normal breath sounds.  Abdominal:     General: Bowel sounds are normal.     Palpations: Abdomen is soft.     Hernia: No hernia is present.  Musculoskeletal:     Cervical back: Normal range of motion.  Lymphadenopathy:     Cervical: Cervical adenopathy present.  Skin:    General: Skin is warm and dry.  Neurological:     Mental Status: He is alert and oriented to person, place, and time.         Latest Ref Rng & Units 03/17/2022    8:05 AM  CMP  Glucose 70 - 99 mg/dL 110   BUN 8 - 23 mg/dL 13   Creatinine 0.61 - 1.24 mg/dL 0.86   Sodium 135 - 145 mmol/L 137   Potassium 3.5 - 5.1 mmol/L 3.5   Chloride 98 - 111 mmol/L 104   CO2 22 - 32 mmol/L 26   Calcium 8.9 - 10.3 mg/dL 8.8   Total Protein 6.5 - 8.1 g/dL 6.7   Total Bilirubin 0.3 - 1.2 mg/dL 0.5   Alkaline Phos 38 - 126 U/L 77   AST 15 - 41 U/L 31   ALT 0 - 44 U/L 20       Latest Ref Rng & Units 03/17/2022    8:05 AM  CBC  WBC 4.0 - 10.5 K/uL 2.4   Hemoglobin 13.0 - 17.0 g/dL 13.3   Hematocrit  39.0 - 52.0 % 36.9   Platelets 150 - 400 K/uL 215     No images  are attached to the encounter.  CT BONE MARROW BIOPSY & ASPIRATION  Result Date: 03/09/2022 INDICATION: Diffuse large B-cell lymphoma. Restaging. Prior bone marrow biopsy 06/25/2020. EXAM: CT GUIDED BONE MARROW ASPIRATION AND CORE BIOPSY MEDICATIONS: None. ANESTHESIA/SEDATION: Moderate (conscious) sedation was employed during this procedure. A total of Versed 1 mg and Fentanyl 25 mcg was administered intravenously. Moderate Sedation Time: 14 minutes. The patient's level of consciousness and vital signs were monitored continuously by radiology nursing throughout the procedure under my direct supervision. FLUOROSCOPY TIME:  CT dose in mGy was not provided. COMPLICATIONS: None immediate. Estimated blood loss: <5 mL PROCEDURE: RADIATION DOSE REDUCTION: This exam was performed according to the departmental dose-optimization program which includes automated exposure control, adjustment of the mA and/or kV according to patient size and/or use of iterative reconstruction technique. Informed written consent was obtained from the patient after a thorough discussion of the procedural risks, benefits and alternatives. All questions were addressed. Maximal Sterile Barrier Technique was utilized including caps, mask, sterile gowns, sterile gloves, sterile drape, hand hygiene and skin antiseptic. A timeout was performed prior to the initiation of the procedure. The patient was positioned prone and non-contrast localization CT was performed of the pelvis to demonstrate the iliac marrow spaces. Maximal barrier sterile technique utilized including caps, mask, sterile gowns, sterile gloves, large sterile drape, hand hygiene, and chlorhexidine prep. Under sterile conditions and local anesthesia, an 11 gauge coaxial bone biopsy needle was advanced into the RIGHT iliac marrow space. Needle position was confirmed with CT imaging. Initially, bone marrow aspiration was performed. Next, the 11 gauge outer cannula was utilized to obtain  a 2 iliac bone marrow core biopsy. Needle was removed. Hemostasis was obtained with compression. The patient tolerated the procedure well. Samples were prepared with the cytotechnologist. IMPRESSION: Successful CT-guided bone marrow aspiration and biopsy, as above. Michaelle Birks, MD [facility] Radiology Electronically Signed   By: Michaelle Birks M.D.   On: 03/09/2022 16:27   NM PET Image Restag (PS) Skull Base To Thigh  Addendum Date: 03/05/2022   ADDENDUM REPORT: 03/05/2022 17:02 ADDENDUM: Correction. New / recurrent hypermetabolic subcutaneous tissue in the high RIGHT back/inferior neck. Finding mis identified as LEFT neck in the finding and impression section of the original report. Electronically Signed   By: Suzy Bouchard M.D.   On: 03/05/2022 17:02   Result Date: 03/05/2022 CLINICAL DATA:  Subsequent treatment strategy for B-cell lymphoma. EXAM: NUCLEAR MEDICINE PET SKULL BASE TO THIGH TECHNIQUE: 9.52 mCi F-18 FDG was injected intravenously. Full-ring PET imaging was performed from the skull base to thigh after the radiotracer. CT data was obtained and used for attenuation correction and anatomic localization. Fasting blood glucose: 80 mg/dl COMPARISON:  None Available. FINDINGS: Mediastinal blood pool activity: SUV max 1.7 Liver activity: SUV max 2.7 NECK: There is a new/increased hypermetabolic focus in the posterior LEFT neck just above the trapezius musculature with SUV max equal 5.6 (image 52). There is a small subcutaneous lesion at this level measuring 11 mm (51/series 2) This tissue appears to correspond to soft tissue nodule measuring 12 mm on comparison exam with very low metabolic activity (SUV max equal 1.2) No additional hypermetabolic lymph nodes in LEFT or RIGHT neck. No hypermetabolic axillary or mediastinal adenopathy. No suspicious pulmonary nodules. Incidental CT findings: None. CHEST: No hypermetabolic mediastinal or hilar nodes. No  suspicious pulmonary nodules  on the CT scan. Incidental CT findings: None. ABDOMEN/PELVIS: No abnormal hypermetabolic activity within the liver, pancreas, adrenal glands, or spleen. No hypermetabolic lymph nodes in the abdomen or pelvis. Incidental CT findings: None. SKELETON: No focal hypermetabolic activity to suggest skeletal metastasis. Incidental CT findings: None. IMPRESSION: 1. New or recurrent hypermetabolic subcutaneous tissue in the high LEFT back/inferior neck. Findings most consistent with lymphoma recurrence. Deauville 5 2. No evidence of additional lymphoma recurrence in the RIGHT neck. 3. No distant lymphoma recurrence in the chest abdomen pelvis. These results will be called to the ordering clinician or representative by the Radiologist Assistant, and communication documented in the PACS or [employer]. Electronically Signed: By: Suzy Bouchard M.D. On: 03/05/2022 09:06    ASSESSMENT & PLAN:  Patient is a 62 y.o. 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  High grade B-cell lymphoma ([place]) PET scan results were reviewed and discussed with patient.  New Right posterior neck nodule with hypermetabolic activity- Deauville 5 PET images were reviewed with patient.  Discussed with patient that I agree with Dr. Janese Banks that physical examination findings and PET scan results are concerning for recurrent lymphoma.  Dr. Janese Banks recommends radiation due to previous prolonged neutropenia after chemotherapy.  Patient appreicates explanation and will keep his radiation appointment.   Follow up  with Dr. Janese Banks  All questions were answered. The patient knows to call the clinic with any problems, questions or concerns. We spent sufficient time to discuss many aspect of care, questions were answered to patient's satisfaction.  A total of 25 minutes was spent on this visit.  With 10 minutes spent reviewing image findings, 10 minutes counseling the patient on the  diagnosis, treatment plan and management of symptoms.  Additional 5 minutes was spent on answering patient's questions.   Earlie Server, MD, PhD [facility] Hematology Oncology 03/30/2022

## 2022-03-31 ENCOUNTER — Ambulatory Visit
Admission: RE | Admit: 2022-03-31 | Discharge: 2022-03-31 | Disposition: A | Discharge status: Home / Self Care | Payer: Medicaid Other | Source: Ambulatory Visit | Attending: Radiation Oncology | Admitting: Radiation Oncology

## 2022-03-31 ENCOUNTER — Encounter: Payer: Self-pay | Admitting: Oncology

## 2022-03-31 ENCOUNTER — Ambulatory Visit: Payer: Medicaid Other

## 2022-03-31 DIAGNOSIS — Z510 Encounter for antineoplastic radiation therapy: Secondary | ICD-10-CM | POA: Diagnosis not present

## 2022-04-01 ENCOUNTER — Ambulatory Visit
Admission: RE | Admit: 2022-04-01 | Discharge: 2022-04-01 | Disposition: A | Discharge status: Home / Self Care | Payer: Medicaid Other | Source: Ambulatory Visit | Attending: Radiation Oncology | Admitting: Radiation Oncology

## 2022-04-01 ENCOUNTER — Other Ambulatory Visit: Payer: Self-pay

## 2022-04-01 DIAGNOSIS — Z510 Encounter for antineoplastic radiation therapy: Secondary | ICD-10-CM | POA: Diagnosis not present

## 2022-04-01 LAB — RAD ONC ARIA SESSION SUMMARY
Course Elapsed Days: 0
Plan Fractions Treated to Date: 1
Plan Prescribed Dose Per Fraction: 2 Gy
Plan Total Fractions Prescribed: 15
Plan Total Prescribed Dose: 30 Gy
Reference Point Dosage Given to Date: 2 Gy
Reference Point Session Dosage Given: 2 Gy
Session Number: 1

## 2022-04-02 ENCOUNTER — Ambulatory Visit
Admission: RE | Admit: 2022-04-02 | Discharge: 2022-04-02 | Disposition: A | Discharge status: Home / Self Care | Payer: Medicaid Other | Source: Ambulatory Visit | Attending: Radiation Oncology | Admitting: Radiation Oncology

## 2022-04-02 ENCOUNTER — Other Ambulatory Visit: Payer: Self-pay

## 2022-04-02 DIAGNOSIS — Z510 Encounter for antineoplastic radiation therapy: Secondary | ICD-10-CM | POA: Diagnosis not present

## 2022-04-02 LAB — RAD ONC ARIA SESSION SUMMARY
Course Elapsed Days: 1
Plan Fractions Treated to Date: 2
Plan Prescribed Dose Per Fraction: 2 Gy
Plan Total Fractions Prescribed: 15
Plan Total Prescribed Dose: 30 Gy
Reference Point Dosage Given to Date: 4 Gy
Reference Point Session Dosage Given: 2 Gy
Session Number: 2

## 2022-04-05 ENCOUNTER — Other Ambulatory Visit: Payer: Self-pay

## 2022-04-05 ENCOUNTER — Ambulatory Visit
Admission: RE | Admit: 2022-04-05 | Discharge: 2022-04-05 | Disposition: A | Discharge status: Home / Self Care | Payer: Medicaid Other | Source: Ambulatory Visit | Attending: Radiation Oncology | Admitting: Radiation Oncology

## 2022-04-05 DIAGNOSIS — Z510 Encounter for antineoplastic radiation therapy: Secondary | ICD-10-CM | POA: Diagnosis not present

## 2022-04-05 LAB — RAD ONC ARIA SESSION SUMMARY
Course Elapsed Days: 4
Plan Fractions Treated to Date: 3
Plan Prescribed Dose Per Fraction: 2 Gy
Plan Total Fractions Prescribed: 15
Plan Total Prescribed Dose: 30 Gy
Reference Point Dosage Given to Date: 6 Gy
Reference Point Session Dosage Given: 2 Gy
Session Number: 3

## 2022-04-06 ENCOUNTER — Other Ambulatory Visit: Payer: Self-pay

## 2022-04-06 ENCOUNTER — Ambulatory Visit
Admission: RE | Admit: 2022-04-06 | Discharge: 2022-04-06 | Disposition: A | Discharge status: Home / Self Care | Payer: Medicaid Other | Source: Ambulatory Visit | Attending: Radiation Oncology | Admitting: Radiation Oncology

## 2022-04-06 DIAGNOSIS — Z510 Encounter for antineoplastic radiation therapy: Secondary | ICD-10-CM | POA: Diagnosis not present

## 2022-04-06 LAB — RAD ONC ARIA SESSION SUMMARY
Course Elapsed Days: 5
Plan Fractions Treated to Date: 4
Plan Prescribed Dose Per Fraction: 2 Gy
Plan Total Fractions Prescribed: 15
Plan Total Prescribed Dose: 30 Gy
Reference Point Dosage Given to Date: 8 Gy
Reference Point Session Dosage Given: 2 Gy
Session Number: 4

## 2022-04-07 ENCOUNTER — Ambulatory Visit
Admission: RE | Admit: 2022-04-07 | Discharge: 2022-04-07 | Disposition: A | Discharge status: Home / Self Care | Payer: Medicaid Other | Source: Ambulatory Visit | Attending: Radiation Oncology | Admitting: Radiation Oncology

## 2022-04-07 ENCOUNTER — Other Ambulatory Visit: Payer: Self-pay

## 2022-04-07 DIAGNOSIS — Z510 Encounter for antineoplastic radiation therapy: Secondary | ICD-10-CM | POA: Diagnosis not present

## 2022-04-07 LAB — RAD ONC ARIA SESSION SUMMARY
Course Elapsed Days: 6
Plan Fractions Treated to Date: 5
Plan Prescribed Dose Per Fraction: 2 Gy
Plan Total Fractions Prescribed: 15
Plan Total Prescribed Dose: 30 Gy
Reference Point Dosage Given to Date: 10 Gy
Reference Point Session Dosage Given: 2 Gy
Session Number: 5

## 2022-04-08 ENCOUNTER — Other Ambulatory Visit: Payer: Self-pay

## 2022-04-08 ENCOUNTER — Ambulatory Visit
Admission: RE | Admit: 2022-04-08 | Discharge: 2022-04-08 | Disposition: A | Discharge status: Home / Self Care | Payer: Medicaid Other | Source: Ambulatory Visit | Attending: Radiation Oncology | Admitting: Radiation Oncology

## 2022-04-08 DIAGNOSIS — Z510 Encounter for antineoplastic radiation therapy: Secondary | ICD-10-CM | POA: Diagnosis not present

## 2022-04-08 LAB — RAD ONC ARIA SESSION SUMMARY
Course Elapsed Days: 7
Plan Fractions Treated to Date: 6
Plan Prescribed Dose Per Fraction: 2 Gy
Plan Total Fractions Prescribed: 15
Plan Total Prescribed Dose: 30 Gy
Reference Point Dosage Given to Date: 12 Gy
Reference Point Session Dosage Given: 2 Gy
Session Number: 6

## 2022-04-09 ENCOUNTER — Other Ambulatory Visit: Payer: Self-pay

## 2022-04-09 ENCOUNTER — Ambulatory Visit
Admission: RE | Admit: 2022-04-09 | Discharge: 2022-04-09 | Disposition: A | Discharge status: Home / Self Care | Payer: Medicaid Other | Source: Ambulatory Visit | Attending: Radiation Oncology | Admitting: Radiation Oncology

## 2022-04-09 DIAGNOSIS — Z510 Encounter for antineoplastic radiation therapy: Secondary | ICD-10-CM | POA: Diagnosis not present

## 2022-04-09 LAB — RAD ONC ARIA SESSION SUMMARY
Course Elapsed Days: 8
Plan Fractions Treated to Date: 7
Plan Prescribed Dose Per Fraction: 2 Gy
Plan Total Fractions Prescribed: 15
Plan Total Prescribed Dose: 30 Gy
Reference Point Dosage Given to Date: 14 Gy
Reference Point Session Dosage Given: 2 Gy
Session Number: 7

## 2022-04-12 ENCOUNTER — Ambulatory Visit: Admission: RE | Admit: 2022-04-12 | Payer: Medicaid Other | Source: Ambulatory Visit

## 2022-04-12 ENCOUNTER — Ambulatory Visit: Payer: Medicaid Other

## 2022-04-13 ENCOUNTER — Ambulatory Visit
Admission: RE | Admit: 2022-04-13 | Discharge: 2022-04-13 | Disposition: A | Discharge status: Home / Self Care | Payer: Medicaid Other | Source: Ambulatory Visit | Attending: Radiation Oncology | Admitting: Radiation Oncology

## 2022-04-13 ENCOUNTER — Other Ambulatory Visit: Payer: Self-pay

## 2022-04-13 DIAGNOSIS — Z510 Encounter for antineoplastic radiation therapy: Secondary | ICD-10-CM | POA: Diagnosis not present

## 2022-04-13 LAB — RAD ONC ARIA SESSION SUMMARY
Course Elapsed Days: 12
Plan Fractions Treated to Date: 8
Plan Prescribed Dose Per Fraction: 2 Gy
Plan Total Fractions Prescribed: 15
Plan Total Prescribed Dose: 30 Gy
Reference Point Dosage Given to Date: 16 Gy
Reference Point Session Dosage Given: 2 Gy
Session Number: 8

## 2022-04-14 ENCOUNTER — Ambulatory Visit
Admission: RE | Admit: 2022-04-14 | Discharge: 2022-04-14 | Disposition: A | Discharge status: Home / Self Care | Payer: Medicaid Other | Source: Ambulatory Visit | Attending: Radiation Oncology | Admitting: Radiation Oncology

## 2022-04-14 ENCOUNTER — Other Ambulatory Visit: Payer: Self-pay

## 2022-04-14 DIAGNOSIS — Z510 Encounter for antineoplastic radiation therapy: Secondary | ICD-10-CM | POA: Diagnosis not present

## 2022-04-14 LAB — RAD ONC ARIA SESSION SUMMARY
Course Elapsed Days: 13
Plan Fractions Treated to Date: 9
Plan Prescribed Dose Per Fraction: 2 Gy
Plan Total Fractions Prescribed: 15
Plan Total Prescribed Dose: 30 Gy
Reference Point Dosage Given to Date: 18 Gy
Reference Point Session Dosage Given: 2 Gy
Session Number: 9

## 2022-04-15 ENCOUNTER — Other Ambulatory Visit: Payer: Self-pay

## 2022-04-15 ENCOUNTER — Ambulatory Visit
Admission: RE | Admit: 2022-04-15 | Discharge: 2022-04-15 | Disposition: A | Discharge status: Home / Self Care | Payer: Medicaid Other | Source: Ambulatory Visit | Attending: Radiation Oncology | Admitting: Radiation Oncology

## 2022-04-15 DIAGNOSIS — C8510 Unspecified B-cell lymphoma, unspecified site: Secondary | ICD-10-CM | POA: Diagnosis present

## 2022-04-15 DIAGNOSIS — Z510 Encounter for antineoplastic radiation therapy: Secondary | ICD-10-CM | POA: Diagnosis present

## 2022-04-15 LAB — RAD ONC ARIA SESSION SUMMARY
Course Elapsed Days: 14
Plan Fractions Treated to Date: 10
Plan Prescribed Dose Per Fraction: 2 Gy
Plan Total Fractions Prescribed: 15
Plan Total Prescribed Dose: 30 Gy
Reference Point Dosage Given to Date: 20 Gy
Reference Point Session Dosage Given: 2 Gy
Session Number: 10

## 2022-04-16 ENCOUNTER — Other Ambulatory Visit (HOSPITAL_COMMUNITY): Payer: Self-pay

## 2022-04-16 ENCOUNTER — Other Ambulatory Visit: Payer: Self-pay

## 2022-04-16 ENCOUNTER — Encounter: Payer: Self-pay | Admitting: Oncology

## 2022-04-16 ENCOUNTER — Ambulatory Visit
Admission: RE | Admit: 2022-04-16 | Discharge: 2022-04-16 | Disposition: A | Discharge status: Home / Self Care | Payer: Medicaid Other | Source: Ambulatory Visit | Attending: Radiation Oncology | Admitting: Radiation Oncology

## 2022-04-16 DIAGNOSIS — Z510 Encounter for antineoplastic radiation therapy: Secondary | ICD-10-CM | POA: Diagnosis not present

## 2022-04-16 LAB — RAD ONC ARIA SESSION SUMMARY
Course Elapsed Days: 15
Plan Fractions Treated to Date: 11
Plan Prescribed Dose Per Fraction: 2 Gy
Plan Total Fractions Prescribed: 15
Plan Total Prescribed Dose: 30 Gy
Reference Point Dosage Given to Date: 22 Gy
Reference Point Session Dosage Given: 2 Gy
Session Number: 11

## 2022-04-19 ENCOUNTER — Ambulatory Visit
Admission: RE | Admit: 2022-04-19 | Discharge: 2022-04-19 | Disposition: A | Discharge status: Home / Self Care | Payer: Medicaid Other | Source: Ambulatory Visit | Attending: Radiation Oncology | Admitting: Radiation Oncology

## 2022-04-19 ENCOUNTER — Other Ambulatory Visit: Payer: Self-pay

## 2022-04-19 ENCOUNTER — Ambulatory Visit: Payer: Medicaid Other

## 2022-04-19 DIAGNOSIS — Z510 Encounter for antineoplastic radiation therapy: Secondary | ICD-10-CM | POA: Diagnosis not present

## 2022-04-19 LAB — RAD ONC ARIA SESSION SUMMARY
Course Elapsed Days: 18
Plan Fractions Treated to Date: 12
Plan Prescribed Dose Per Fraction: 2 Gy
Plan Total Fractions Prescribed: 15
Plan Total Prescribed Dose: 30 Gy
Reference Point Dosage Given to Date: 24 Gy
Reference Point Session Dosage Given: 2 Gy
Session Number: 12

## 2022-04-20 ENCOUNTER — Ambulatory Visit
Admission: RE | Admit: 2022-04-20 | Discharge: 2022-04-20 | Disposition: A | Discharge status: Home / Self Care | Payer: Medicaid Other | Source: Ambulatory Visit | Attending: Radiation Oncology | Admitting: Radiation Oncology

## 2022-04-20 ENCOUNTER — Other Ambulatory Visit: Payer: Self-pay

## 2022-04-20 DIAGNOSIS — Z510 Encounter for antineoplastic radiation therapy: Secondary | ICD-10-CM | POA: Diagnosis not present

## 2022-04-20 LAB — RAD ONC ARIA SESSION SUMMARY
Course Elapsed Days: 19
Plan Fractions Treated to Date: 13
Plan Prescribed Dose Per Fraction: 2 Gy
Plan Total Fractions Prescribed: 15
Plan Total Prescribed Dose: 30 Gy
Reference Point Dosage Given to Date: 26 Gy
Reference Point Session Dosage Given: 2 Gy
Session Number: 13

## 2022-04-21 ENCOUNTER — Ambulatory Visit: Payer: Medicaid Other

## 2022-04-21 ENCOUNTER — Ambulatory Visit
Admission: RE | Admit: 2022-04-21 | Discharge: 2022-04-21 | Disposition: A | Discharge status: Home / Self Care | Payer: Medicaid Other | Source: Ambulatory Visit | Attending: Radiation Oncology | Admitting: Radiation Oncology

## 2022-04-21 ENCOUNTER — Other Ambulatory Visit: Payer: Self-pay

## 2022-04-21 DIAGNOSIS — Z510 Encounter for antineoplastic radiation therapy: Secondary | ICD-10-CM | POA: Diagnosis not present

## 2022-04-21 LAB — RAD ONC ARIA SESSION SUMMARY
Course Elapsed Days: 20
Plan Fractions Treated to Date: 14
Plan Prescribed Dose Per Fraction: 2 Gy
Plan Total Fractions Prescribed: 15
Plan Total Prescribed Dose: 30 Gy
Reference Point Dosage Given to Date: 28 Gy
Reference Point Session Dosage Given: 2 Gy
Session Number: 14

## 2022-04-22 ENCOUNTER — Telehealth: Payer: Self-pay | Admitting: Pharmacist

## 2022-04-22 ENCOUNTER — Telehealth: Payer: Self-pay

## 2022-04-22 ENCOUNTER — Other Ambulatory Visit: Payer: Self-pay

## 2022-04-22 ENCOUNTER — Ambulatory Visit
Admission: RE | Admit: 2022-04-22 | Discharge: 2022-04-22 | Disposition: A | Discharge status: Home / Self Care | Payer: Medicaid Other | Source: Ambulatory Visit | Attending: Radiation Oncology | Admitting: Radiation Oncology

## 2022-04-22 ENCOUNTER — Encounter: Payer: Self-pay | Admitting: Oncology

## 2022-04-22 ENCOUNTER — Other Ambulatory Visit (HOSPITAL_COMMUNITY): Payer: Self-pay

## 2022-04-22 DIAGNOSIS — Z510 Encounter for antineoplastic radiation therapy: Secondary | ICD-10-CM | POA: Diagnosis not present

## 2022-04-22 DIAGNOSIS — R768 Other specified abnormal immunological findings in serum: Secondary | ICD-10-CM

## 2022-04-22 LAB — RAD ONC ARIA SESSION SUMMARY
Course Elapsed Days: 21
Plan Fractions Treated to Date: 15
Plan Prescribed Dose Per Fraction: 2 Gy
Plan Total Fractions Prescribed: 15
Plan Total Prescribed Dose: 30 Gy
Reference Point Dosage Given to Date: 30 Gy
Reference Point Session Dosage Given: 2 Gy
Session Number: 15

## 2022-04-22 MED ORDER — VEMLIDY 25 MG PO TABS
25.0000 mg | ORAL_TABLET | Freq: Every day | ORAL | 2 refills | Status: DC
  Filled 2022-04-22 (×2): qty 30, 30d supply, fill #0
  Filled 2022-08-03: qty 30, 30d supply, fill #1
  Filled 2022-09-01: qty 30, 30d supply, fill #2

## 2022-04-22 NOTE — Telephone Encounter (Signed)
Mr. Gigante agreed to restart [facility] for Hep B prophylaxis, reviewed dosing/administration and side effected. Patient will have medication delivered on 04/26/22.

## 2022-04-22 NOTE — Telephone Encounter (Signed)
Oral Oncology Patient Advocate Encounter  Prior Authorization for Shirlee Latch has been approved.    PA# [id number]  Effective dates: 04/21/22 through 10/18/22  Patients co-pay is $4.00.    Berdine Addison, [facility] Patient [place]  [phone] (phone) [phone] (fax) 04/22/2022 8:21 AM

## 2022-04-27 ENCOUNTER — Other Ambulatory Visit (HOSPITAL_COMMUNITY): Payer: Self-pay

## 2022-04-28 ENCOUNTER — Inpatient Hospital Stay: Payer: Medicaid Other | Attending: Oncology

## 2022-04-28 ENCOUNTER — Encounter: Payer: Self-pay | Admitting: Oncology

## 2022-04-28 ENCOUNTER — Inpatient Hospital Stay (HOSPITAL_BASED_OUTPATIENT_CLINIC_OR_DEPARTMENT_OTHER): Payer: Medicaid Other | Admitting: Oncology

## 2022-04-28 VITALS — BP 123/76 | HR 73 | Temp 96.0°F | Resp 16 | Wt 177.0 lb

## 2022-04-28 DIAGNOSIS — C8510 Unspecified B-cell lymphoma, unspecified site: Secondary | ICD-10-CM

## 2022-04-28 DIAGNOSIS — Z809 Family history of malignant neoplasm, unspecified: Secondary | ICD-10-CM | POA: Diagnosis not present

## 2022-04-28 DIAGNOSIS — Z7961 Long term (current) use of immunomodulator: Secondary | ICD-10-CM | POA: Diagnosis not present

## 2022-04-28 DIAGNOSIS — Z8269 Family history of other diseases of the musculoskeletal system and connective tissue: Secondary | ICD-10-CM | POA: Insufficient documentation

## 2022-04-28 DIAGNOSIS — Z87891 Personal history of nicotine dependence: Secondary | ICD-10-CM | POA: Diagnosis not present

## 2022-04-28 DIAGNOSIS — C8331 Diffuse large B-cell lymphoma, lymph nodes of head, face, and neck: Secondary | ICD-10-CM | POA: Diagnosis present

## 2022-04-28 DIAGNOSIS — Z832 Family history of diseases of the blood and blood-forming organs and certain disorders involving the immune mechanism: Secondary | ICD-10-CM | POA: Insufficient documentation

## 2022-04-28 DIAGNOSIS — Z8349 Family history of other endocrine, nutritional and metabolic diseases: Secondary | ICD-10-CM | POA: Insufficient documentation

## 2022-04-28 DIAGNOSIS — R590 Localized enlarged lymph nodes: Secondary | ICD-10-CM | POA: Insufficient documentation

## 2022-04-28 DIAGNOSIS — D709 Neutropenia, unspecified: Secondary | ICD-10-CM | POA: Diagnosis not present

## 2022-04-28 DIAGNOSIS — Z79899 Other long term (current) drug therapy: Secondary | ICD-10-CM | POA: Diagnosis not present

## 2022-04-28 DIAGNOSIS — Z8249 Family history of ischemic heart disease and other diseases of the circulatory system: Secondary | ICD-10-CM | POA: Insufficient documentation

## 2022-04-28 LAB — CBC WITH DIFFERENTIAL/PLATELET
Abs Immature Granulocytes: 0.02 10*3/uL (ref 0.00–0.07)
Basophils Absolute: 0 10*3/uL (ref 0.0–0.1)
Basophils Relative: 1 %
Eosinophils Absolute: 0.1 10*3/uL (ref 0.0–0.5)
Eosinophils Relative: 2 %
HCT: 38.2 % — ABNORMAL LOW (ref 39.0–52.0)
Hemoglobin: 13.6 g/dL (ref 13.0–17.0)
Immature Granulocytes: 1 %
Lymphocytes Relative: 21 %
Lymphs Abs: 0.5 10*3/uL — ABNORMAL LOW (ref 0.7–4.0)
MCH: 31.5 pg (ref 26.0–34.0)
MCHC: 35.6 g/dL (ref 30.0–36.0)
MCV: 88.4 fL (ref 80.0–100.0)
Monocytes Absolute: 0.3 10*3/uL (ref 0.1–1.0)
Monocytes Relative: 12 %
Neutro Abs: 1.4 10*3/uL — ABNORMAL LOW (ref 1.7–7.7)
Neutrophils Relative %: 63 %
Platelets: 185 10*3/uL (ref 150–400)
RBC: 4.32 MIL/uL (ref 4.22–5.81)
RDW: 13.5 % (ref 11.5–15.5)
WBC: 2.3 10*3/uL — ABNORMAL LOW (ref 4.0–10.5)
nRBC: 0 % (ref 0.0–0.2)

## 2022-04-28 LAB — COMPREHENSIVE METABOLIC PANEL
ALT: 19 U/L (ref 0–44)
AST: 31 U/L (ref 15–41)
Albumin: 4.1 g/dL (ref 3.5–5.0)
Alkaline Phosphatase: 85 U/L (ref 38–126)
Anion gap: 9 (ref 5–15)
BUN: 11 mg/dL (ref 8–23)
CO2: 26 mmol/L (ref 22–32)
Calcium: 9 mg/dL (ref 8.9–10.3)
Chloride: 104 mmol/L (ref 98–111)
Creatinine, Ser: 0.92 mg/dL (ref 0.61–1.24)
GFR, Estimated: >60 mL/min (ref >60)
Glucose, Bld: 132 mg/dL — ABNORMAL HIGH (ref 70–99)
Potassium: 3.7 mmol/L (ref 3.5–5.1)
Sodium: 139 mmol/L (ref 135–145)
Total Bilirubin: 0.7 mg/dL (ref 0.3–1.2)
Total Protein: 6.7 g/dL (ref 6.5–8.1)

## 2022-04-28 LAB — LACTATE DEHYDROGENASE: LDH: 244 U/L — ABNORMAL HIGH (ref 98–192)

## 2022-04-28 NOTE — Progress Notes (Signed)
Pt in for follow up reports he has not started taking Vemlidy, wanted to discuss with Dr Janese Banks first.  Pt has had a weight decrease of 6 lbs but reports he eats well, has good appetite and likes his current weight.  Pt denies need for dietician referral.

## 2022-04-28 NOTE — Progress Notes (Signed)
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  This was followed by a PET CT scan which showed extensive FDG avid adenopathy in the neck chest abdomen and pelvis.  FDG avid splenic lesions.  Solitary intramuscular FDG avid lesion in the right biceps femoris muscle.   Supraclavicular excisional lymph node biopsy showed high-grade B-cell lymphoma germinal center type Ki-67 greater than 95%.  FISH testing was positive for Bcl-2 BCL6 and MYC consistent with triple hit lymphoma.  By NCCN IPI score would be 4 based on age and elevated LDH and stage IV (intramuscular biceps femoris lesion) which puts him in the high intermediate risk group. 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   Patient received RCHOP for cycle 1 with plans for [facility] with cycle 2. IT MTX for CNS prophylaxis and he has received 3 cycles but declined further cycles.  MRI brain negative for lymphoma.  Chemotherapy was not being able to escalated upwards due to neutropenia which has persisted to 3 weeks and he has been receiving treatment every 4 weeks    PET CT scan after 3 cycles of chemotherapy showedComplete or near complete metabolic response.  Residual disease in the anterior upper right mediastinum slightly greater than background mediastinal activity   Patient found to have a clinically palpable right supraclavicular lymph node which was subsequently biopsied and was consistent with previously diagnosed lymphoma.  PET CT scan showedNew right level 5 adenopathy in the lower neck with dominant lymph node 1.6 cm Deauville 5.  No other findings of acute active lymphoma in chest abdomen pelvis and bones.  MRI brain negative for leptomeningeal disease.   Patient seen for second opinion by Dr. Lonia Blood at [facility] for consideration of CAR-T cell therapy.  Patient did not wish to proceed with CAR-T cell treatment after reviewing risks versus benefits.  Moreover patient's health insurance will also not cover CAR-T cell therapy. Patient started on second line tafasitamab/Revlimid regimen in June 2023.  He is on Revlimid 15 mg 3 weeks on and 1 week off.  Disease progression after 3 cycles and patient was switched to [facility] regimen     Patient was noted to haveSignificant decrease in the size of supraclavicular lymph nodes consistent with treatment response Deauville 3 after 3 cycles of Pola BR chemotherapy.  Patient has only received 4 cycles of chemotherapy so far due to persistent neutropenia despite receiving growth factor support.  Repeat PET scan showed new/recurrent hypermetabolic subcutaneous tissue in the high right back/inferior neck consistent with recurrent lymphoma Deauville 5.  He received palliative radiation to that area.  Interval history-patient currently feels well and denies any complaints at this time.  Mass at the base of  the right neck has resolved.  He has lost 6 pounds over the last 1 month which she says is intentional because of being active.  Appetite is good.  ECOG PS- 0 Pain scale- 0 Opioid associated constipation- no  Review of  systems- Review of Systems  Constitutional:  Negative for chills, fever, malaise/fatigue and weight loss.  HENT:  Negative for congestion, ear discharge and nosebleeds.   Eyes:  Negative for blurred vision.  Respiratory:  Negative for cough, hemoptysis, sputum production, shortness of breath and wheezing.   Cardiovascular:  Negative for chest pain, palpitations, orthopnea and claudication.  Gastrointestinal:  Negative for abdominal pain, blood in stool, constipation, diarrhea, heartburn, melena, nausea and vomiting.  Genitourinary:  Negative for dysuria, flank pain, frequency, hematuria and urgency.  Musculoskeletal:  Negative for back pain, joint pain and myalgias.  Skin:  Negative for rash.  Neurological:  Negative for dizziness, tingling, focal weakness, seizures, weakness and headaches.  Endo/Heme/Allergies:  Does not bruise/bleed easily.  Psychiatric/Behavioral:  Negative for depression and suicidal ideas. The patient does not have insomnia.       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     Past Surgical History:  Procedure Laterality Date   BONE MARROW BIOPSY     COLONOSCOPY     EXCISION MASS NECK Right 06/12/2020   Procedure: EXCISION MASS NECK;  Surgeon: Jules Husbands, MD;  Location: [facility] ORS;  Service: General;  Laterality: Right;   HERNIA REPAIR Right    at age 1-RIH   43 [place] Right 06/24/2020   Procedure: INSERTION PORT-A-CATH;  Surgeon: Jules Husbands, MD;  Location: [facility] ORS;  Service: General;  Laterality: Right;    Social History   Socioeconomic History   Marital status: Single    Spouse name: Not on file   Number of children: Not on file   Years of education: Not on file   Highest education level: Not on file  Occupational History   Not on file  Tobacco Use   Smoking status:  Former    Types: Cigarettes    Quit date: 06/13/2020    Years since quitting: 1.8   Smokeless tobacco: Never  Vaping Use   Vaping Use: Never used  Substance and Sexual Activity   Alcohol use: Not Currently   Drug use: Not Currently    Types: Marijuana   Sexual activity: Not Currently  Other Topics Concern   Not on file  Social History Narrative   Has daughter and grandson in the home   Social Determinants of Health   Financial Resource Strain: Not on file  Food Insecurity: Not on file  Transportation Needs: Not on file  Physical Activity: Not on file  Stress: Not on file  Social Connections: Not on file  Intimate Partner Violence: Not on file    Family History  Problem Relation Age of Onset   Anemia Mother    Hypertension Mother    Goiter Mother    Cancer Father    Cancer Sister    Multiple sclerosis Sister      Current Outpatient Medications:    acyclovir (ZOVIRAX) 400 MG tablet, Take 1 tablet (400 mg total) by mouth 2 (two) times daily., Disp: 180 tablet, Rfl: 1   allopurinol (ZYLOPRIM) 300 MG tablet, TAKE  1 TABLET BY MOUTH EVERY DAY, Disp: 30 tablet, Rfl: 1   aspirin EC 81 MG tablet, Take 1 tablet (81 mg total) by mouth daily. Swallow whole., Disp: 30 tablet, Rfl: 0   atenolol (TENORMIN) 25 MG tablet, Take 25 mg by mouth daily., Disp: , Rfl:    cholecalciferol (VITAMIN D3) 25 MCG (1000 UNIT) tablet, Take 1,000 Units by mouth daily., Disp: , Rfl:    lisinopril (ZESTRIL) 10 MG tablet, Take 10 mg by mouth daily., Disp: , Rfl:    Omega-3 Fatty Acids (FISH OIL ADULT GUMMIES PO), Take by mouth., Disp: , Rfl:    vitamin C (ASCORBIC ACID) 500 MG tablet, Take 500 mg by mouth daily., Disp: , Rfl:    Tenofovir Alafenamide Fumarate (VEMLIDY) 25 MG TABS, Take 1 tablet (25 mg total) by mouth daily. Take with food. (Patient not taking: Reported on 04/28/2022), Disp: 30 tablet, Rfl: 2 No current facility-administered medications for this visit.  Facility-Administered Medications  Ordered in Other Visits:    0.9 %  sodium chloride infusion, , Intravenous, Continuous, Sindy Guadeloupe, MD, Stopped at 09/09/20 1507   0.9 %  sodium chloride infusion, , Intravenous, Continuous, Cammie Sickle, MD, Stopped at 09/11/20 1502   0.9 %  sodium chloride infusion, , Intravenous, Continuous, Sindy Guadeloupe, MD, Stopped at 11/06/20 1424   heparin lock flush 100 unit/mL, 500 Units, Intravenous, Once, Sindy Guadeloupe, MD   methotrexate (PF) 12 mg in sodium chloride (PF) 0.9 % INTRATHECAL chemo injection, , Intrathecal, Once, Sindy Guadeloupe, MD   sodium chloride flush (NS) 0.9 % injection 10 mL, 10 mL, Intravenous, PRN, Sindy Guadeloupe, MD, 10 mL at 11/18/21 0910   sodium chloride flush (NS) 0.9 % injection 10 mL, 10 mL, Intracatheter, PRN, Sindy Guadeloupe, MD, 10 mL at 12/09/21 0843  Physical exam:  Vitals:   04/28/22 1051  BP: 123/76  Pulse: 73  Resp: 16  Temp: (!) 96 F (35.6 C)  TempSrc: Tympanic  Weight: 177 lb (80.3 kg)   Physical Exam Cardiovascular:     Rate and Rhythm: Normal rate and regular rhythm.     Heart sounds: Normal heart sounds.  Pulmonary:     Effort: Pulmonary effort is normal.     Breath sounds: Normal breath sounds.  Abdominal:     General: Bowel sounds are normal.     Palpations: Abdomen is soft.     Comments: No palpable hepatosplenomegaly  Lymphadenopathy:     Comments: No palpable cervical, supraclavicular, axillary or inguinal adenopathy    Skin:    General: Skin is warm and dry.  Neurological:     Mental Status: He is alert and oriented to person, place, and time.         Latest Ref Rng & Units 04/28/2022   10:11 AM  CMP  Glucose 70 - 99 mg/dL 132   BUN 8 - 23 mg/dL 11   Creatinine 0.61 - 1.24 mg/dL 0.92   Sodium 135 - 145 mmol/L 139   Potassium 3.5 - 5.1 mmol/L 3.7   Chloride 98 - 111 mmol/L 104   CO2 22 - 32 mmol/L 26   Calcium 8.9 - 10.3 mg/dL 9.0   Total Protein 6.5 - 8.1 g/dL 6.7   Total Bilirubin 0.3 - 1.2 mg/dL 0.7    Alkaline Phos 38 - 126 U/L 85   AST 15 - 41 U/L 31   ALT 0 - 44 U/L 19  Latest Ref Rng & Units 04/28/2022   10:11 AM  CBC  WBC 4.0 - 10.5 K/uL 2.3   Hemoglobin 13.0 - 17.0 g/dL 13.6   Hematocrit 39.0 - 52.0 % 38.2   Platelets 150 - 400 K/uL 185     Assessment and plan- Patient is a 62 y.o. 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  He is here for routine follow-up  Patient has persistent leukopenia/neutropenia despite not receiving Pola BR treatment for 3 months now.  Clinically his right supraclavicular/base of neck adenopathy has resolved after palliative radiation.  I plan is to get a repeat PET/CT scan sometime in the next 2 weeks and see him thereafter.  If he has no evidence of recurrent lymphoma on the PET scan I will continue to observe him off treatment.  If there is evidence of recurrent disease I will consider offering him Zynlonta at that time.  He will continue to keep his port in place.  Patient has hepatitis B core antibody positive but ideally I would like him to continue tenofovir up to 6 months after completing Rituxan based treatment and ideally while he is on Zynlonta as well.  Patient is concerned that he may get a skin rash with this drug.  He had a rash when he was on tenofovir along with allopurinol and Bactrim and it was unclear as to which drug caused the rash.  I have encouraged him to take the drug      Visit Diagnosis 1. High grade B-cell lymphoma ([place])   2. Chronic neutropenia (HCC)      Dr. Randa Evens, MD, MPH [facility] [id number] 04/28/2022 3:20 PM

## 2022-05-12 ENCOUNTER — Ambulatory Visit
Admission: RE | Admit: 2022-05-12 | Discharge: 2022-05-12 | Disposition: A | Discharge status: Home / Self Care | Payer: Medicaid Other | Source: Ambulatory Visit | Attending: Oncology | Admitting: Oncology

## 2022-05-12 DIAGNOSIS — C8510 Unspecified B-cell lymphoma, unspecified site: Secondary | ICD-10-CM | POA: Insufficient documentation

## 2022-05-12 DIAGNOSIS — Z923 Personal history of irradiation: Secondary | ICD-10-CM | POA: Diagnosis not present

## 2022-05-12 LAB — GLUCOSE, CAPILLARY: Glucose-Capillary: 96 mg/dL (ref 70–99)

## 2022-05-12 MED ORDER — FLUDEOXYGLUCOSE F - 18 (FDG) INJECTION
9.2000 | Freq: Once | INTRAVENOUS | Status: AC | PRN
  Administered 2022-05-12: 9.94 via INTRAVENOUS

## 2022-05-13 ENCOUNTER — Other Ambulatory Visit (HOSPITAL_COMMUNITY): Payer: Self-pay

## 2022-05-17 ENCOUNTER — Other Ambulatory Visit (HOSPITAL_COMMUNITY): Payer: Self-pay

## 2022-05-17 ENCOUNTER — Encounter: Payer: Self-pay | Admitting: Oncology

## 2022-05-17 ENCOUNTER — Inpatient Hospital Stay (HOSPITAL_BASED_OUTPATIENT_CLINIC_OR_DEPARTMENT_OTHER): Payer: Medicaid Other | Admitting: Oncology

## 2022-05-17 ENCOUNTER — Inpatient Hospital Stay: Payer: Medicaid Other | Attending: Oncology

## 2022-05-17 VITALS — BP 115/73 | HR 83 | Temp 97.6°F | Ht 73.0 in | Wt 179.1 lb

## 2022-05-17 DIAGNOSIS — C8510 Unspecified B-cell lymphoma, unspecified site: Secondary | ICD-10-CM

## 2022-05-17 DIAGNOSIS — Z7961 Long term (current) use of immunomodulator: Secondary | ICD-10-CM | POA: Diagnosis not present

## 2022-05-17 DIAGNOSIS — Z8269 Family history of other diseases of the musculoskeletal system and connective tissue: Secondary | ICD-10-CM | POA: Insufficient documentation

## 2022-05-17 DIAGNOSIS — Z809 Family history of malignant neoplasm, unspecified: Secondary | ICD-10-CM | POA: Insufficient documentation

## 2022-05-17 DIAGNOSIS — C8331 Diffuse large B-cell lymphoma, lymph nodes of head, face, and neck: Secondary | ICD-10-CM | POA: Insufficient documentation

## 2022-05-17 DIAGNOSIS — Z832 Family history of diseases of the blood and blood-forming organs and certain disorders involving the immune mechanism: Secondary | ICD-10-CM | POA: Diagnosis not present

## 2022-05-17 DIAGNOSIS — Z79624 Long term (current) use of inhibitors of nucleotide synthesis: Secondary | ICD-10-CM | POA: Diagnosis not present

## 2022-05-17 DIAGNOSIS — Z8249 Family history of ischemic heart disease and other diseases of the circulatory system: Secondary | ICD-10-CM | POA: Insufficient documentation

## 2022-05-17 DIAGNOSIS — D701 Agranulocytosis secondary to cancer chemotherapy: Secondary | ICD-10-CM

## 2022-05-17 DIAGNOSIS — Z79899 Other long term (current) drug therapy: Secondary | ICD-10-CM | POA: Insufficient documentation

## 2022-05-17 DIAGNOSIS — T451X5A Adverse effect of antineoplastic and immunosuppressive drugs, initial encounter: Secondary | ICD-10-CM

## 2022-05-17 DIAGNOSIS — Z95828 Presence of other vascular implants and grafts: Secondary | ICD-10-CM

## 2022-05-17 DIAGNOSIS — Z87891 Personal history of nicotine dependence: Secondary | ICD-10-CM | POA: Insufficient documentation

## 2022-05-17 DIAGNOSIS — Z8349 Family history of other endocrine, nutritional and metabolic diseases: Secondary | ICD-10-CM | POA: Diagnosis not present

## 2022-05-17 LAB — COMPREHENSIVE METABOLIC PANEL
ALT: 18 U/L (ref 0–44)
AST: 33 U/L (ref 15–41)
Albumin: 4.1 g/dL (ref 3.5–5.0)
Alkaline Phosphatase: 91 U/L (ref 38–126)
Anion gap: 9 (ref 5–15)
BUN: 15 mg/dL (ref 8–23)
CO2: 25 mmol/L (ref 22–32)
Calcium: 8.9 mg/dL (ref 8.9–10.3)
Chloride: 103 mmol/L (ref 98–111)
Creatinine, Ser: 1.17 mg/dL (ref 0.61–1.24)
GFR, Estimated: >60 mL/min (ref >60)
Glucose, Bld: 118 mg/dL — ABNORMAL HIGH (ref 70–99)
Potassium: 3.4 mmol/L — ABNORMAL LOW (ref 3.5–5.1)
Sodium: 137 mmol/L (ref 135–145)
Total Bilirubin: 0.8 mg/dL (ref 0.3–1.2)
Total Protein: 6.6 g/dL (ref 6.5–8.1)

## 2022-05-17 LAB — CBC WITH DIFFERENTIAL/PLATELET
Abs Immature Granulocytes: 0.03 10*3/uL (ref 0.00–0.07)
Basophils Absolute: 0.1 10*3/uL (ref 0.0–0.1)
Basophils Relative: 2 %
Eosinophils Absolute: 0.2 10*3/uL (ref 0.0–0.5)
Eosinophils Relative: 5 %
HCT: 38.2 % — ABNORMAL LOW (ref 39.0–52.0)
Hemoglobin: 13.2 g/dL (ref 13.0–17.0)
Immature Granulocytes: 1 %
Lymphocytes Relative: 29 %
Lymphs Abs: 0.8 10*3/uL (ref 0.7–4.0)
MCH: 31.1 pg (ref 26.0–34.0)
MCHC: 34.6 g/dL (ref 30.0–36.0)
MCV: 90.1 fL (ref 80.0–100.0)
Monocytes Absolute: 0.3 10*3/uL (ref 0.1–1.0)
Monocytes Relative: 9 %
Neutro Abs: 1.5 10*3/uL — ABNORMAL LOW (ref 1.7–7.7)
Neutrophils Relative %: 54 %
Platelets: 194 10*3/uL (ref 150–400)
RBC: 4.24 MIL/uL (ref 4.22–5.81)
RDW: 13.8 % (ref 11.5–15.5)
WBC: 2.9 10*3/uL — ABNORMAL LOW (ref 4.0–10.5)
nRBC: 0 % (ref 0.0–0.2)

## 2022-05-17 MED ORDER — SODIUM CHLORIDE 0.9% FLUSH
10.0000 mL | Freq: Once | INTRAVENOUS | Status: AC
  Administered 2022-05-17: 10 mL via INTRAVENOUS
  Filled 2022-05-17: qty 10

## 2022-05-17 MED ORDER — HEPARIN SOD (PORK) LOCK FLUSH 100 UNIT/ML IV SOLN
500.0000 [IU] | Freq: Once | INTRAVENOUS | Status: AC
  Administered 2022-05-17: 500 [IU] via INTRAVENOUS
  Filled 2022-05-17: qty 5

## 2022-05-17 NOTE — Progress Notes (Signed)
No concerns today 

## 2022-05-17 NOTE — Progress Notes (Signed)
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  This was followed by a PET CT scan which showed extensive FDG avid adenopathy in the neck chest abdomen and pelvis.  FDG avid splenic lesions.  Solitary intramuscular FDG avid lesion in the right biceps femoris muscle.   Supraclavicular excisional lymph node biopsy showed high-grade B-cell lymphoma germinal center type Ki-67 greater than 95%.  FISH testing was positive for Bcl-2 BCL6 and MYC consistent with triple hit lymphoma.  By NCCN IPI score would be 4 based on age and elevated LDH and stage IV (intramuscular biceps femoris lesion) which puts him in the high intermediate risk group. 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   Patient received RCHOP for cycle 1 with plans for DA [facility] with cycle 2. IT MTX for CNS prophylaxis and he has received 3 cycles but declined further cycles.  MRI brain negative for lymphoma.  Chemotherapy was not being able to escalated upwards due to neutropenia which has persisted to 3 weeks and he has been receiving treatment every 4  weeks   PET CT scan after 3 cycles of chemotherapy showedComplete or near complete metabolic response.  Residual disease in the anterior upper right mediastinum slightly greater than background mediastinal activity   Patient found to have a clinically palpable right supraclavicular lymph node which was subsequently biopsied and was consistent with previously diagnosed lymphoma.  PET CT scan showedNew right level 5 adenopathy in the lower neck with dominant lymph node 1.6 cm Deauville 5.  No other findings of acute active lymphoma in chest abdomen pelvis and bones.  MRI brain negative for leptomeningeal disease.   Patient seen for second opinion by Dr. Lonia Blood at [facility] for consideration of CAR-T cell therapy.  Patient did not wish to proceed with CAR-T cell treatment after reviewing risks versus benefits.  Moreover patient's health insurance will also not cover CAR-T cell therapy. Patient started on second line tafasitamab/Revlimid regimen in June 2023.  He is on Revlimid 15 mg 3 weeks on and 1 week off.  Disease progression after 3 cycles and patient was switched to [facility] regimen     Patient was noted to haveSignificant decrease in the size of supraclavicular lymph nodes consistent with treatment response Deauville 3 after 3 cycles of Pola BR chemotherapy.  Patient has only received 4 cycles of chemotherapy so far due to persistent neutropenia despite receiving growth factor support.  Repeat PET scan showed new/recurrent hypermetabolic subcutaneous tissue in the high right back/inferior neck consistent with recurrent lymphoma Deauville 5.  He received palliative radiation to that area.    Interval history-patient feels well today and denies any specific complaints at this time.  Appetite  and weightRemained stable  ECOG PS- 0 Pain scale- 0 Opioid associated constipation- no  Review of systems- Review of Systems  Constitutional:  Negative for chills, fever, malaise/fatigue and weight loss.  HENT:   Negative for congestion, ear discharge and nosebleeds.   Eyes:  Negative for blurred vision.  Respiratory:  Negative for cough, hemoptysis, sputum production, shortness of breath and wheezing.   Cardiovascular:  Negative for chest pain, palpitations, orthopnea and claudication.  Gastrointestinal:  Negative for abdominal pain, blood in stool, constipation, diarrhea, heartburn, melena, nausea and vomiting.  Genitourinary:  Negative for dysuria, flank pain, frequency, hematuria and urgency.  Musculoskeletal:  Negative for back pain, joint pain and myalgias.  Skin:  Negative for rash.  Neurological:  Negative for dizziness, tingling, focal weakness, seizures, weakness and headaches.  Endo/Heme/Allergies:  Does not bruise/bleed easily.  Psychiatric/Behavioral:  Negative for depression and suicidal ideas. The patient does not have insomnia.       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     Past Surgical History:  Procedure Laterality Date   BONE MARROW BIOPSY     COLONOSCOPY     EXCISION MASS NECK Right 06/12/2020   Procedure: EXCISION MASS NECK;  Surgeon: Jules Husbands, MD;  Location: [facility] ORS;  Service: General;  Laterality: Right;   HERNIA REPAIR Right    at age 77-RIH   34 [place] Right 06/24/2020   Procedure: INSERTION PORT-A-CATH;  Surgeon: Jules Husbands, MD;  Location: [facility] ORS;  Service: General;  Laterality: Right;    Social History   Socioeconomic History   Marital status: Single    Spouse name: Not on file   Number of children: Not on file   Years of education: Not on file   Highest education level: Not on file  Occupational History   Not on file  Tobacco Use   Smoking status: Former    Types: Cigarettes    Quit date: 06/13/2020    Years since quitting: 1.9   Smokeless tobacco: Never  Vaping  Use   Vaping Use: Never used  Substance and Sexual Activity   Alcohol use: Not Currently   Drug use: Not Currently    Types: Marijuana   Sexual activity: Not Currently  Other Topics Concern   Not on file  Social History Narrative   Has daughter and grandson in the home   Social Determinants of Health   Financial Resource Strain: Not on file  Food Insecurity: Not on file  Transportation Needs: Not on file  Physical Activity: Not on file  Stress: Not on file  Social Connections: Not on file  Intimate Partner Violence: Not on file    Family History  Problem Relation Age of Onset   Anemia Mother    Hypertension Mother    Goiter Mother    Cancer Father    Cancer Sister    Multiple sclerosis Sister      Current Outpatient Medications:    acyclovir (ZOVIRAX) 400 MG tablet, Take 1 tablet (400 mg total) by mouth 2 (two) times daily., Disp: 180 tablet, Rfl: 1   allopurinol (ZYLOPRIM) 300 MG tablet, TAKE 1 TABLET BY MOUTH EVERY DAY, Disp: 30 tablet, Rfl: 1   aspirin EC 81 MG tablet, Take 1 tablet (81 mg total) by mouth  daily. Swallow whole., Disp: 30 tablet, Rfl: 0   atenolol (TENORMIN) 25 MG tablet, Take 25 mg by mouth daily., Disp: , Rfl:    cholecalciferol (VITAMIN D3) 25 MCG (1000 UNIT) tablet, Take 1,000 Units by mouth daily., Disp: , Rfl:    lisinopril (ZESTRIL) 10 MG tablet, Take 10 mg by mouth daily., Disp: , Rfl:    Omega-3 Fatty Acids (FISH OIL ADULT GUMMIES PO), Take by mouth., Disp: , Rfl:    Tenofovir Alafenamide Fumarate (VEMLIDY) 25 MG TABS, Take 1 tablet (25 mg total) by mouth daily. Take with food., Disp: 30 tablet, Rfl: 2   vitamin C (ASCORBIC ACID) 500 MG tablet, Take 500 mg by mouth daily., Disp: , Rfl:  No current facility-administered medications for this visit.  Facility-Administered Medications Ordered in Other Visits:    0.9 %  sodium chloride infusion, , Intravenous, Continuous, Sindy Guadeloupe, MD, Stopped at 09/09/20 1507   0.9 %  sodium chloride  infusion, , Intravenous, Continuous, Cammie Sickle, MD, Stopped at 09/11/20 1502   0.9 %  sodium chloride infusion, , Intravenous, Continuous, Sindy Guadeloupe, MD, Stopped at 11/06/20 1424   heparin lock flush 100 unit/mL, 500 Units, Intravenous, Once, Sindy Guadeloupe, MD   methotrexate (PF) 12 mg in sodium chloride (PF) 0.9 % INTRATHECAL chemo injection, , Intrathecal, Once, Sindy Guadeloupe, MD   sodium chloride flush (NS) 0.9 % injection 10 mL, 10 mL, Intravenous, PRN, Sindy Guadeloupe, MD, 10 mL at 11/18/21 0910   sodium chloride flush (NS) 0.9 % injection 10 mL, 10 mL, Intracatheter, PRN, Sindy Guadeloupe, MD, 10 mL at 12/09/21 0843  Physical exam:  Vitals:   05/17/22 1341  BP: 115/73  Pulse: 83  Temp: 97.6 F (36.4 C)  TempSrc: Tympanic  SpO2: 100%  Weight: 179 lb 1.6 oz (81.2 kg)  Height: '6\' 1"'$  (1.854 m)   Physical Exam Cardiovascular:     Rate and Rhythm: Normal rate and regular rhythm.     Heart sounds: Normal heart sounds.  Pulmonary:     Effort: Pulmonary effort is normal.     Breath sounds: Normal breath sounds.  Skin:    General: Skin is warm and dry.  Neurological:     Mental Status: He is alert and oriented to person, place, and time.         Latest Ref Rng & Units 05/17/2022    1:18 PM  CMP  Glucose 70 - 99 mg/dL 118   BUN 8 - 23 mg/dL 15   Creatinine 0.61 - 1.24 mg/dL 1.17   Sodium 135 - 145 mmol/L 137   Potassium 3.5 - 5.1 mmol/L 3.4   Chloride 98 - 111 mmol/L 103   CO2 22 - 32 mmol/L 25   Calcium 8.9 - 10.3 mg/dL 8.9   Total Protein 6.5 - 8.1 g/dL 6.6   Total Bilirubin 0.3 - 1.2 mg/dL 0.8   Alkaline Phos 38 - 126 U/L 91   AST 15 - 41 U/L 33   ALT 0 - 44 U/L 18       Latest Ref Rng & Units 05/17/2022    1:18 PM  CBC  WBC 4.0 - 10.5 K/uL 2.9   Hemoglobin 13.0 - 17.0 g/dL 13.2   Hematocrit 39.0 - 52.0 % 38.2   Platelets 150 - 400 K/uL 194     No images are attached to the encounter.  NM PET Image Restag (PS) Skull Base To Thigh  Result  Date:  05/12/2022 CLINICAL DATA:  Initial treatment strategy for high-grade B-cell lymphoma. Radiation therapy complete. Recent palliative radiation therapy. EXAM: NUCLEAR MEDICINE PET SKULL BASE TO THIGH TECHNIQUE: 9.94 mCi F-18 FDG was injected intravenously. Full-ring PET imaging was performed from the skull base to thigh after the radiotracer. CT data was obtained and used for attenuation correction and anatomic localization. Fasting blood glucose: 96 mg/dl COMPARISON:  PET-CT 03/02/2022. FINDINGS: Mediastinal blood pool activity: SUV max 2.1 Liver activity: SUV max NA NECK: Near complete resolution of metabolic activity in the subcutaneous soft tissue of the RIGHT neck following radiation therapy. Residual activity is less than background blood pool activity. New new enlarged or radiotracer avid lymph nodes in the neck. Incidental CT findings: None. CHEST: Choose one Diffuse activity through the esophagus is favored related inflammation/esophagitis. Incidental CT findings: None. ABDOMEN/PELVIS: No enlarged or hypermetabolic lymph nodes in the abdomen pelvis. Normal spleen. Diffuse hypermetabolic activity in the gastric fundus and body (image 81). This accompanies diffuse activity in the esophagus. No CT lesion present. Mild mucosal thickening is within normal limits. Incidental CT findings: None. SKELETON: No focal hypermetabolic activity to suggest skeletal metastasis. Incidental CT findings: None. IMPRESSION: 1. Near complete resolution of metabolic activity associated with the subcutaneous tissue in the high RIGHT back/neck. ( Deauville 1) 2. No evidence of recurrent lymphoma. 3. Diffuse metabolic activity in esophagus and stomach is favored physiologic/inflammatory. Similar activity in the stomach on comparison exam. Recommend correlation for gastritis. Electronically Signed   By: Suzy Bouchard M.D.   On: 05/12/2022 11:02     Assessment and plan- Patient is a 62 y.o. 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  He is here for routine follow-up and discuss PET CT scan results and further management   I have reviewed PET CT scan images independently and discussed findings with the patient which shows resolution of previously noted right supraclavicular adenopathy.  Deauville 1.  Patient last received Bendamustine Rituxan chemotherapy in October 2023.  He had some residual disease in his high right neck which was treated with radiation treatment.  Patient still has some residual neutropenia likely secondary to slow bone marrow recovery from prior chemotherapy.  He also has some benign ethnic neutropenia to begin with at baseline.  Bone marrow biopsy did not show any evidence of high-grade lymphoma.  My plan is to monitor him clinically as well as with surveillance PET scans every 3 months for now.  Port will remain in place and will be flushed every 3 months.  I will see him back in 3 months with labs and scans prior    Visit Diagnosis 1. High grade B-cell lymphoma ([place])      Dr. Randa Evens, MD, MPH [facility] [id number] 05/17/2022 3:55 PM

## 2022-05-19 ENCOUNTER — Other Ambulatory Visit (HOSPITAL_COMMUNITY): Payer: Self-pay

## 2022-05-19 ENCOUNTER — Other Ambulatory Visit: Payer: Self-pay

## 2022-05-26 ENCOUNTER — Ambulatory Visit
Admission: RE | Admit: 2022-05-26 | Discharge: 2022-05-26 | Disposition: A | Discharge status: Home / Self Care | Payer: Medicaid Other | Source: Ambulatory Visit | Attending: Radiation Oncology | Admitting: Radiation Oncology

## 2022-05-26 ENCOUNTER — Encounter: Payer: Self-pay | Admitting: Radiation Oncology

## 2022-05-26 VITALS — BP 119/72 | HR 78 | Temp 97.0°F | Resp 20 | Ht 73.0 in | Wt 181.0 lb

## 2022-05-26 DIAGNOSIS — C8338 Diffuse large B-cell lymphoma, lymph nodes of multiple sites: Secondary | ICD-10-CM | POA: Diagnosis present

## 2022-05-26 NOTE — Progress Notes (Signed)
Radiation Oncology Follow up Note  Name: Christopher Mills   Date:   05/26/2022 MRN:  [id number] DOB: 1960-07-14    This 62 y.o. 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  REFERRING PROVIDER: [name], MD  HPI: Patient is a 62 year old male.  Now out 1 month having completed involved field radiation therapy to the right supraclavicular region right lower neck and occipital lesion all consistent with progression of refractory triple hit diffuse B-cell lymphoma.  He is seen today and doing well specifically denies any symptoms.  He had a recent PET scan showing near complete resolution of all metabolic activity as assessed with the subcutaneous tissue in the right neck and back involved 1.  COMPLICATIONS OF TREATMENT: none  FOLLOW UP COMPLIANCE: keeps appointments   PHYSICAL EXAM:  BP 119/72 (BP Location: Right Arm, Patient Position: Sitting, Cuff Size: Normal)   Pulse 78   Temp (!) 97 F (36.1 C) (Tympanic)   Resp 20   Ht '6\' 1"'$  (1.854 m)   Wt 181 lb (82.1 kg)   BMI 23.88 kg/m  No evidence of adenopathy in the head and neck region or supraclavicular region.  Well-developed well-nourished patient in NAD. HEENT reveals PERLA, EOMI, discs not visualized.  Oral cavity is clear. No oral mucosal lesions are identified. Neck is clear without evidence of cervical or supraclavicular adenopathy. Lungs are clear to A&P. Cardiac examination is essentially unremarkable with regular rate and rhythm without murmur rub or thrill. Abdomen is benign with no organomegaly or masses noted. Motor sensory and DTR levels are equal and symmetric in the upper and lower extremities. Cranial nerves II through XII are grossly intact. Proprioception is intact. No peripheral adenopathy or edema is identified. No motor or sensory levels are noted. Crude visual fields are within normal  range.  RADIOLOGY RESULTS: PET scan reviewed compatible with above-stated findings  PLAN: Present time patient has had a complete response to radiation therapy.  At this time he remains under observation with medical oncology with repeat PET CT scans every 3 months.  I am going to turn follow-up care over to medical oncology I be happy to reevaluate the patient anytime should that be indicated.  I would like to take this opportunity to thank you for allowing me to participate in the care of your patient.Noreene Filbert, MD

## 2022-06-15 ENCOUNTER — Other Ambulatory Visit: Payer: Self-pay

## 2022-06-16 ENCOUNTER — Encounter: Payer: Self-pay | Admitting: Oncology

## 2022-07-12 ENCOUNTER — Telehealth: Payer: Self-pay | Admitting: *Deleted

## 2022-07-12 NOTE — Telephone Encounter (Signed)
Pt called that he had sinus issues and used mucinex, that part from nasal area got better and now feels like it is in his chest. He wanted to see if Smith Robert needs to see him or the PCP. Per Smith Robert he can go see PCP. He will call pcp.

## 2022-07-19 ENCOUNTER — Other Ambulatory Visit: Payer: Self-pay

## 2022-07-21 ENCOUNTER — Other Ambulatory Visit: Payer: Self-pay

## 2022-07-23 ENCOUNTER — Other Ambulatory Visit: Payer: Self-pay

## 2022-07-30 ENCOUNTER — Other Ambulatory Visit: Payer: Self-pay

## 2022-08-03 ENCOUNTER — Other Ambulatory Visit: Payer: Self-pay

## 2022-08-03 ENCOUNTER — Other Ambulatory Visit (HOSPITAL_COMMUNITY): Payer: Self-pay

## 2022-08-12 ENCOUNTER — Ambulatory Visit
Admission: RE | Admit: 2022-08-12 | Discharge: 2022-08-12 | Disposition: A | Discharge status: Home / Self Care | Payer: Medicaid Other | Source: Ambulatory Visit | Attending: Oncology | Admitting: Oncology

## 2022-08-12 DIAGNOSIS — R9389 Abnormal findings on diagnostic imaging of other specified body structures: Secondary | ICD-10-CM | POA: Diagnosis not present

## 2022-08-12 DIAGNOSIS — C8510 Unspecified B-cell lymphoma, unspecified site: Secondary | ICD-10-CM | POA: Insufficient documentation

## 2022-08-12 LAB — GLUCOSE, CAPILLARY: Glucose-Capillary: 86 mg/dL (ref 70–99)

## 2022-08-12 MED ORDER — FLUDEOXYGLUCOSE F - 18 (FDG) INJECTION
9.4000 | Freq: Once | INTRAVENOUS | Status: AC | PRN
  Administered 2022-08-12: 10.27 via INTRAVENOUS

## 2022-08-17 ENCOUNTER — Encounter: Payer: Self-pay | Admitting: Oncology

## 2022-08-17 ENCOUNTER — Other Ambulatory Visit: Payer: Self-pay

## 2022-08-17 ENCOUNTER — Inpatient Hospital Stay (HOSPITAL_BASED_OUTPATIENT_CLINIC_OR_DEPARTMENT_OTHER): Payer: Medicaid Other | Admitting: Oncology

## 2022-08-17 ENCOUNTER — Inpatient Hospital Stay: Payer: Medicaid Other | Attending: Oncology

## 2022-08-17 VITALS — BP 127/91 | HR 64 | Temp 97.3°F | Resp 18 | Ht 73.2 in | Wt 178.9 lb

## 2022-08-17 DIAGNOSIS — Z923 Personal history of irradiation: Secondary | ICD-10-CM | POA: Diagnosis not present

## 2022-08-17 DIAGNOSIS — Z7952 Long term (current) use of systemic steroids: Secondary | ICD-10-CM | POA: Insufficient documentation

## 2022-08-17 DIAGNOSIS — Z87891 Personal history of nicotine dependence: Secondary | ICD-10-CM | POA: Insufficient documentation

## 2022-08-17 DIAGNOSIS — D72819 Decreased white blood cell count, unspecified: Secondary | ICD-10-CM | POA: Diagnosis not present

## 2022-08-17 DIAGNOSIS — R590 Localized enlarged lymph nodes: Secondary | ICD-10-CM | POA: Insufficient documentation

## 2022-08-17 DIAGNOSIS — C8510 Unspecified B-cell lymphoma, unspecified site: Secondary | ICD-10-CM

## 2022-08-17 DIAGNOSIS — Z7189 Other specified counseling: Secondary | ICD-10-CM

## 2022-08-17 DIAGNOSIS — Z79899 Other long term (current) drug therapy: Secondary | ICD-10-CM | POA: Insufficient documentation

## 2022-08-17 DIAGNOSIS — C8331 Diffuse large B-cell lymphoma, lymph nodes of head, face, and neck: Secondary | ICD-10-CM | POA: Diagnosis present

## 2022-08-17 DIAGNOSIS — Z5112 Encounter for antineoplastic immunotherapy: Secondary | ICD-10-CM | POA: Insufficient documentation

## 2022-08-17 DIAGNOSIS — Z7961 Long term (current) use of immunomodulator: Secondary | ICD-10-CM | POA: Diagnosis not present

## 2022-08-17 DIAGNOSIS — Z95828 Presence of other vascular implants and grafts: Secondary | ICD-10-CM

## 2022-08-17 LAB — CBC WITH DIFFERENTIAL/PLATELET
Abs Immature Granulocytes: 0.01 10*3/uL (ref 0.00–0.07)
Basophils Absolute: 0 10*3/uL (ref 0.0–0.1)
Basophils Relative: 0 %
Eosinophils Absolute: 0.2 10*3/uL (ref 0.0–0.5)
Eosinophils Relative: 8 %
HCT: 33.8 % — ABNORMAL LOW (ref 39.0–52.0)
Hemoglobin: 12 g/dL — ABNORMAL LOW (ref 13.0–17.0)
Immature Granulocytes: 0 %
Lymphocytes Relative: 33 %
Lymphs Abs: 0.8 10*3/uL (ref 0.7–4.0)
MCH: 32.7 pg (ref 26.0–34.0)
MCHC: 35.5 g/dL (ref 30.0–36.0)
MCV: 92.1 fL (ref 80.0–100.0)
Monocytes Absolute: 0.4 10*3/uL (ref 0.1–1.0)
Monocytes Relative: 15 %
Neutro Abs: 1.1 10*3/uL — ABNORMAL LOW (ref 1.7–7.7)
Neutrophils Relative %: 44 %
Platelets: 180 10*3/uL (ref 150–400)
RBC: 3.67 MIL/uL — ABNORMAL LOW (ref 4.22–5.81)
RDW: 14.4 % (ref 11.5–15.5)
WBC: 2.4 10*3/uL — ABNORMAL LOW (ref 4.0–10.5)
nRBC: 0 % (ref 0.0–0.2)

## 2022-08-17 LAB — COMPREHENSIVE METABOLIC PANEL
ALT: 16 U/L (ref 0–44)
AST: 21 U/L (ref 15–41)
Albumin: 4 g/dL (ref 3.5–5.0)
Alkaline Phosphatase: 73 U/L (ref 38–126)
Anion gap: 6 (ref 5–15)
BUN: 11 mg/dL (ref 8–23)
CO2: 24 mmol/L (ref 22–32)
Calcium: 9 mg/dL (ref 8.9–10.3)
Chloride: 107 mmol/L (ref 98–111)
Creatinine, Ser: 0.92 mg/dL (ref 0.61–1.24)
GFR, Estimated: >60 mL/min (ref >60)
Glucose, Bld: 89 mg/dL (ref 70–99)
Potassium: 3.9 mmol/L (ref 3.5–5.1)
Sodium: 137 mmol/L (ref 135–145)
Total Bilirubin: 0.2 mg/dL — ABNORMAL LOW (ref 0.3–1.2)
Total Protein: 6.4 g/dL — ABNORMAL LOW (ref 6.5–8.1)

## 2022-08-17 MED ORDER — DEXAMETHASONE 4 MG PO TABS
4.0000 mg | ORAL_TABLET | Freq: Two times a day (BID) | ORAL | 11 refills | Status: DC
Start: 2022-08-17 — End: 2022-10-01
  Filled 2022-08-17: qty 6, 3d supply, fill #0
  Filled 2022-10-01: qty 6, 3d supply, fill #1

## 2022-08-17 MED ORDER — LIDOCAINE-PRILOCAINE 2.5-2.5 % EX CREA
1.0000 | TOPICAL_CREAM | Freq: Once | CUTANEOUS | 3 refills | Status: AC
Start: 2022-08-17 — End: 2022-08-25
  Filled 2022-08-17: qty 30, 30d supply, fill #0

## 2022-08-17 MED ORDER — ONDANSETRON HCL 8 MG PO TABS
8.0000 mg | ORAL_TABLET | Freq: Three times a day (TID) | ORAL | 1 refills | Status: DC | PRN
Start: 2022-08-17 — End: 2024-01-03
  Filled 2022-08-17: qty 30, 10d supply, fill #0

## 2022-08-17 MED ORDER — HEPARIN SOD (PORK) LOCK FLUSH 100 UNIT/ML IV SOLN
500.0000 [IU] | Freq: Once | INTRAVENOUS | Status: AC
  Administered 2022-08-17: 500 [IU] via INTRAVENOUS
  Filled 2022-08-17: qty 5

## 2022-08-17 MED ORDER — SODIUM CHLORIDE 0.9% FLUSH
10.0000 mL | Freq: Once | INTRAVENOUS | Status: AC
  Administered 2022-08-17: 10 mL via INTRAVENOUS
  Filled 2022-08-17: qty 10

## 2022-08-17 MED ORDER — PROCHLORPERAZINE MALEATE 10 MG PO TABS
10.0000 mg | ORAL_TABLET | Freq: Four times a day (QID) | ORAL | 1 refills | Status: DC | PRN
Start: 2022-08-17 — End: 2024-01-03
  Filled 2022-08-17: qty 30, 8d supply, fill #0

## 2022-08-17 NOTE — Progress Notes (Signed)
DISCONTINUE OFF PATHWAY REGIMEN - Lymphoma and CLL   [id number]:Polatuzumab 1.8 mg/kg IV D1 + Bendamustine 90 mg/m2 IV D1,2 + Rituximab 375 mg/m2 IV D1 q21 Days x 6 Cycles:   A cycle is every 21 days:     Rituximab-xxxx      Polatuzumab vedotin-piiq      Bendamustine   **Always confirm dose/schedule in your pharmacy ordering system**  REASON: Disease Progression PRIOR TREATMENT: Off Pathway: Polatuzumab 1.8 mg/kg IV D1 + Bendamustine 90 mg/m2 IV D1,2 + Rituximab 375 mg/m2 IV D1 q21 Days x 6 Cycles TREATMENT RESPONSE: Progressive Disease (PD)  START OFF PATHWAY REGIMEN - Lymphoma and CLL   [id number]:Loncastuximab tesirine 0.15/0.075 mg/kg IV D1 q21 Days:   Cycles 1 and 2: A cycle is every 21 days:     Loncastuximab tesirine-lpyl    Cycles 3 and beyond: A cycle is every 21 days:     Loncastuximab tesirine-lpyl   **Always confirm dose/schedule in your pharmacy ordering system**  Patient Characteristics: Double Hit Lymphoma, Second Line and Beyond Disease Type: Not Applicable Disease Type: Double Hit Lymphoma Disease Type: Not Applicable Line of therapy: Second Line and Beyond Intent of Therapy: Non-Curative / Palliative Intent, Discussed with Patient

## 2022-08-18 ENCOUNTER — Encounter: Payer: Self-pay | Admitting: Oncology

## 2022-08-18 NOTE — Progress Notes (Signed)
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  This was followed by a PET CT scan which showed extensive FDG avid adenopathy in the neck chest abdomen and pelvis.  FDG avid splenic lesions.  Solitary intramuscular FDG avid lesion in the right biceps femoris muscle.   Supraclavicular excisional lymph node biopsy showed high-grade B-cell lymphoma germinal center type Ki-67 greater than 95%.  FISH testing was positive for Bcl-2 BCL6 and MYC consistent with triple hit lymphoma.  By NCCN IPI score would be 4 based on age and elevated LDH and stage IV (intramuscular biceps femoris lesion) which puts him in the high intermediate risk group. 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   Patient received RCHOP for cycle 1 with plans for DA [place] with cycle 2. IT MTX for CNS prophylaxis and he has received 3 cycles but declined further cycles.  MRI brain negative for lymphoma.  Chemotherapy was not being able to escalated upwards due to neutropenia which has persisted to 3 weeks and he has been  receiving treatment every 4 weeks   PET CT scan after 3 cycles of chemotherapy showedComplete or near complete metabolic response.  Residual disease in the anterior upper right mediastinum slightly greater than background mediastinal activity   Patient found to have a clinically palpable right supraclavicular lymph node which was subsequently biopsied and was consistent with previously diagnosed lymphoma.  PET CT scan showedNew right level 5 adenopathy in the lower neck with dominant lymph node 1.6 cm Deauville 5.  No other findings of acute active lymphoma in chest abdomen pelvis and bones.  MRI brain negative for leptomeningeal disease.   Patient seen for second opinion by Dr. Aviva Kluver at [facility] for consideration of CAR-T cell therapy.  Patient did not wish to proceed with CAR-T cell treatment after reviewing risks versus benefits.  Moreover patient's health insurance will also not cover CAR-T cell therapy. Patient started on second line tafasitamab/Revlimid regimen in June 2023.  He is on Revlimid 15 mg 3 weeks on and 1 week off.  Disease progression after 3 cycles and patient was switched to [facility] regimen     Patient was noted to haveSignificant decrease in the size of supraclavicular lymph nodes consistent with treatment response Deauville 3 after 3 cycles of Pola BR chemotherapy.  Patient has only received 4 cycles of chemotherapy so far due to persistent neutropenia despite receiving growth factor support.  Repeat PET scan showed new/recurrent hypermetabolic subcutaneous tissue in the high right back/inferior neck consistent with recurrent lymphoma Deauville 5.  He received palliative radiation to that area.    Interval history-patient has noticed subcutaneous swelling in his undersurface of the right  arm as well as around the right scapula.  He feels well overall.  Denies any complaints at this time  ECOG PS- 0 Pain scale- 0 Opioid associated constipation- no  Review of systems- Review of  Systems  Constitutional:  Negative for chills, fever, malaise/fatigue and weight loss.  HENT:  Negative for congestion, ear discharge and nosebleeds.   Eyes:  Negative for blurred vision.  Respiratory:  Negative for cough, hemoptysis, sputum production, shortness of breath and wheezing.   Cardiovascular:  Negative for chest pain, palpitations, orthopnea and claudication.  Gastrointestinal:  Negative for abdominal pain, blood in stool, constipation, diarrhea, heartburn, melena, nausea and vomiting.  Genitourinary:  Negative for dysuria, flank pain, frequency, hematuria and urgency.  Musculoskeletal:  Negative for back pain, joint pain and myalgias.  Skin:  Negative for rash.  Neurological:  Negative for dizziness, tingling, focal weakness, seizures, weakness and headaches.  Endo/Heme/Allergies:  Does not bruise/bleed easily.  Psychiatric/Behavioral:  Negative for depression and suicidal ideas. The patient does not have insomnia.       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     Past Surgical History:  Procedure Laterality Date   BONE MARROW BIOPSY     COLONOSCOPY     EXCISION MASS NECK Right 06/12/2020   Procedure: EXCISION MASS NECK;  Surgeon: Leafy Ro, MD;  Location: [facility] ORS;  Service: General;  Laterality: Right;   HERNIA REPAIR Right    at age 22-RIH   36 CATH INSERTION     PORTACATH PLACEMENT Right 06/24/2020   Procedure: INSERTION PORT-A-CATH;  Surgeon: Leafy Ro, MD;  Location: [facility] ORS;  Service: General;  Laterality: Right;    Social History   Socioeconomic History   Marital status: Single    Spouse name: Not on file   Number of children: Not on file   Years of education: Not on file   Highest education level: Not on file  Occupational History   Not on file  Tobacco Use   Smoking status: Former    Types:  Cigarettes    Quit date: 06/13/2020    Years since quitting: 2.1   Smokeless tobacco: Never  Vaping Use   Vaping Use: Never used  Substance and Sexual Activity   Alcohol use: Not Currently   Drug use: Not Currently    Types: Marijuana   Sexual activity: Not Currently  Other Topics Concern   Not on file  Social History Narrative   Has daughter and grandson in the home   Social Determinants of Health   Financial Resource Strain: Not on file  Food Insecurity: Not on file  Transportation Needs: Not on file  Physical Activity: Not on file  Stress: Not on file  Social Connections: Not on file  Intimate Partner Violence: Not on file    Family History  Problem Relation Age of Onset   Anemia Mother    Hypertension Mother    Goiter Mother    Cancer Father    Cancer Sister    Multiple sclerosis Sister      Current Outpatient Medications:    allopurinol (ZYLOPRIM) 300 MG tablet, TAKE 1 TABLET BY MOUTH EVERY DAY, Disp: 30 tablet, Rfl: 1   aspirin EC 81 MG tablet, Take 1 tablet (81 mg total) by mouth daily. Swallow whole., Disp: 30 tablet, Rfl:  0   atenolol (TENORMIN) 25 MG tablet, Take 25 mg by mouth daily., Disp: , Rfl:    cholecalciferol (VITAMIN D3) 25 MCG (1000 UNIT) tablet, Take 1,000 Units by mouth daily., Disp: , Rfl:    lisinopril (ZESTRIL) 10 MG tablet, Take 10 mg by mouth daily., Disp: , Rfl:    Omega-3 Fatty Acids (FISH OIL ADULT GUMMIES PO), Take by mouth., Disp: , Rfl:    Tenofovir Alafenamide Fumarate (VEMLIDY) 25 MG TABS, Take 1 tablet (25 mg total) by mouth daily. Take with food., Disp: 30 tablet, Rfl: 2   vitamin C (ASCORBIC ACID) 500 MG tablet, Take 500 mg by mouth daily., Disp: , Rfl:    dexamethasone (DECADRON) 4 MG tablet, Take 1 tablet (4 mg total) by mouth 2 (two) times daily. Take for 1 day before Loncastuximab, and for 1 day after., Disp: 6 tablet, Rfl: 11   lidocaine-prilocaine (EMLA) cream, Apply 1 Application topically once as directed, Disp: 30 g, Rfl:  3   ondansetron (ZOFRAN) 8 MG tablet, Take 1 tablet (8 mg total) by mouth every 8 (eight) hours as needed for nausea or vomiting., Disp: 30 tablet, Rfl: 1   prochlorperazine (COMPAZINE) 10 MG tablet, Take 1 tablet (10 mg total) by mouth every 6 (six) hours as needed for nausea or vomiting., Disp: 30 tablet, Rfl: 1 No current facility-administered medications for this visit.  Facility-Administered Medications Ordered in Other Visits:    0.9 %  sodium chloride infusion, , Intravenous, Continuous, Creig Hines, MD, Stopped at 09/09/20 1507   0.9 %  sodium chloride infusion, , Intravenous, Continuous, Earna Coder, MD, Stopped at 09/11/20 1502   0.9 %  sodium chloride infusion, , Intravenous, Continuous, Creig Hines, MD, Stopped at 11/06/20 1424   heparin lock flush 100 unit/mL, 500 Units, Intravenous, Once, Creig Hines, MD   methotrexate (PF) 12 mg in sodium chloride (PF) 0.9 % INTRATHECAL chemo injection, , Intrathecal, Once, Creig Hines, MD  Physical exam:  Vitals:   08/17/22 1320  BP: (!) 127/91  Pulse: 64  Resp: 18  Temp: (!) 97.3 F (36.3 C)  TempSrc: Tympanic  SpO2: 100%  Weight: 178 lb 14.4 oz (81.1 kg)  Height: 6' 1.2" (1.859 m)   Physical Exam Cardiovascular:     Rate and Rhythm: Normal rate and regular rhythm.     Heart sounds: Normal heart sounds.  Pulmonary:     Effort: Pulmonary effort is normal.     Breath sounds: Normal breath sounds.  Abdominal:     General: Bowel sounds are normal.     Palpations: Abdomen is soft.  Musculoskeletal:     Comments: Ill-defined subcutaneous mass about 3 cm felt over the right scapula and a 2 cm mass palpable under his right arm  Skin:    General: Skin is warm and dry.  Neurological:     Mental Status: He is alert and oriented to person, place, and time.         Latest Ref Rng & Units 08/17/2022    1:03 PM  CMP  Glucose 70 - 99 mg/dL 89   BUN 8 - 23 mg/dL 11   Creatinine 1.61 - 1.24 mg/dL 0.96   Sodium 045  - 409 mmol/L 137   Potassium 3.5 - 5.1 mmol/L 3.9   Chloride 98 - 111 mmol/L 107   CO2 22 - 32 mmol/L 24   Calcium 8.9 - 10.3 mg/dL 9.0   Total Protein 6.5 - 8.1 g/dL  6.4   Total Bilirubin 0.3 - 1.2 mg/dL 0.2   Alkaline Phos 38 - 126 U/L 73   AST 15 - 41 U/L 21   ALT 0 - 44 U/L 16       Latest Ref Rng & Units 08/17/2022    1:03 PM  CBC  WBC 4.0 - 10.5 K/uL 2.4   Hemoglobin 13.0 - 17.0 g/dL 16.1   Hematocrit 09.6 - 52.0 % 33.8   Platelets 150 - 400 K/uL 180       NM PET Image Restag (PS) Skull Base To Thigh  Result Date: 08/16/2022 CLINICAL DATA:  Subsequent treatment strategy for high-grade B-cell lymphoma. EXAM: NUCLEAR MEDICINE PET SKULL BASE TO THIGH TECHNIQUE: 10.3 mCi F-18 FDG was injected intravenously. Full-ring PET imaging was performed from the skull base to thigh after the radiotracer. CT data was obtained and used for attenuation correction and anatomic localization. Fasting blood glucose: 86 mg/dl COMPARISON:  None Available. FINDINGS: Mediastinal blood pool activity: SUV max 2.1 Liver activity: SUV max NA NECK: No residual hypermetabolic activity in the RIGHT neck at site of prior recurrence Incidental CT findings: None. CHEST: Unfortunately, there is intense metabolic activity in the subcutaneous tissue superficial to the RIGHT shoulder musculature. There is measurable subcutaneous tissue measuring 3.0 by 1.8 cm on image 42/4) with intense metabolic activity (SUV max equal 11.7 on image 42). Slightly more anterior there is subcutaneous tissue measuring 2.3 cm (image 38) with SUV max equal 8.7. Finally slightly more anterior a subcutaneous nodule measuring 0.8 cm on image 35 with SUV max equal 6.6. Incidental CT findings: No suspicious pulmonary nodules. No mediastinal lymphadenopathy. ABDOMEN/PELVIS: No abnormal hypermetabolic activity within the liver, pancreas, adrenal glands, or spleen. No hypermetabolic lymph nodes in the abdomen or pelvis. Incidental CT findings: None.  SKELETON: No focal hypermetabolic activity to suggest skeletal metastasis. Incidental CT findings: None. IMPRESSION: 1. Three hypermetabolic subcutaneous nodules superficial to the RIGHT shoulder musculature Lesions highly concerning for lymphoma recurrence (Deauville 5). Vaccination reaction seems unlikely with this pattern; however, recommend clinical correlation 2. No evidence of residual hypermetabolic activity in the RIGHT neck at site of prior recurrence. 3. No evidence of lymphoma elsewhere on whole-body FDG PET scan. Electronically Signed   By: Genevive Bi M.D.   On: 08/16/2022 14:53     Assessment and plan- Patient is a 62 y.o. 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  He is here to discuss further management  Patient last received for cycle 4 of Pola BR chemotherapy in October 2023.  Plan was to give him 6 cycles but due to prolonged neutropenia this could not be continued.  Bone marrow biopsy did not reveal any evidence of lymphoma or other cause of neutropenia and this was likely secondary to chemotherapy.  Moreover patient has baseline benign ethnic neutropenia even prior to starting treatment.  He was found to have recurrent disease in his right neck in December 2023 and therefore underwent palliative radiation to that area while we were awaiting count recovery.  PET scan in February 2024 showed resolution of these areas with a Deauville score of 1.  I have reviewed recent PET CT scan images independently and discussed findings with the patient that unfortunately recurrent disease in the right shoulder musculature and clinically this consistent with recurrent lymphoma.  Patient has small ulcerated nodules in his bilateral forearms which do not appear clinically as lymphoma but we will continue to keep an eye on these.  Patient has  now had disease progression on R-EPOCH chemotherapy, to facet nerve and more recently on Pola BR chemotherapy.  Options at  this time include discussion of by specific antibody therapy with drug such as glofitamab.  This is usually given as an inpatient and can be associated with cytokine release syndrome and has to be done under [facility].  This drug has been shown to have about 52% overall response rate including 49% with complete response and at 49-month progression free survival of 37%.  This is given intravenously for a fixed duration of 12 cycles.  However patient is not interested in going to [facility] at this time and prefers to treatment here at [facility].  I therefore recommend fourth line loncastuximab for him. A multicenter study (LOTIS-2) reported 48 percent ORR (24 percent CR) among 145 patients with refractory DLBCL or higher-risk disease (eg, double-hit, triple-hit, or transformed DLBCL) . Grade ?3 AEs included neutropenia (26 percent), thrombocytopenia (18 percent), and liver enzyme abnormalities (17 percent); no fatal events were considered related to treatment. A phase 1 study reported 42 percent ORR and five-month DOR in patients with DLBCL.   Given that he has baseline leukopenia with an ANC presently at 1.1, I plan to start Zynlonta at a dose of 0.075 mg/kg instead of 0.15 mg/kg and adjust dosing based on counts.  Discussed risks and benefits of Zynlonta including all but not limited to nausea, vomiting, low blood counts, risk of infections and hospitalization.  We will plan to give him growth factor support with this drug.  He will directly proceed with cycle 1 next week.  He will be seen by covering provider 4 weeks from now for cycle 2 and I will see him back 7 weeks from now for cycle 3   Visit Diagnosis 1. High grade B-cell lymphoma (HCC)   2. Goals of care, counseling/discussion      Dr. Owens Shark, MD, MPH [facility] at [facility] [phone] 08/18/2022 8:16 AM

## 2022-08-19 ENCOUNTER — Other Ambulatory Visit: Payer: Self-pay

## 2022-08-23 MED FILL — Dexamethasone Sodium Phosphate Inj 100 MG/10ML: INTRAMUSCULAR | Qty: 1 | Status: AC

## 2022-08-24 ENCOUNTER — Other Ambulatory Visit (HOSPITAL_COMMUNITY): Payer: Self-pay

## 2022-08-24 ENCOUNTER — Inpatient Hospital Stay: Payer: Medicaid Other

## 2022-08-24 VITALS — BP 136/82 | HR 70 | Temp 96.1°F | Resp 18 | Wt 178.4 lb

## 2022-08-24 DIAGNOSIS — Z5112 Encounter for antineoplastic immunotherapy: Secondary | ICD-10-CM | POA: Diagnosis not present

## 2022-08-24 DIAGNOSIS — C8510 Unspecified B-cell lymphoma, unspecified site: Secondary | ICD-10-CM

## 2022-08-24 LAB — CBC WITH DIFFERENTIAL (CANCER CENTER ONLY)
Abs Immature Granulocytes: 0.02 10*3/uL (ref 0.00–0.07)
Basophils Absolute: 0 10*3/uL (ref 0.0–0.1)
Basophils Relative: 1 %
Eosinophils Absolute: 0.2 10*3/uL (ref 0.0–0.5)
Eosinophils Relative: 6 %
HCT: 37 % — ABNORMAL LOW (ref 39.0–52.0)
Hemoglobin: 13 g/dL (ref 13.0–17.0)
Immature Granulocytes: 1 %
Lymphocytes Relative: 27 %
Lymphs Abs: 1.1 10*3/uL (ref 0.7–4.0)
MCH: 32 pg (ref 26.0–34.0)
MCHC: 35.1 g/dL (ref 30.0–36.0)
MCV: 91.1 fL (ref 80.0–100.0)
Monocytes Absolute: 0.5 10*3/uL (ref 0.1–1.0)
Monocytes Relative: 12 %
Neutro Abs: 2.1 10*3/uL (ref 1.7–7.7)
Neutrophils Relative %: 53 %
Platelet Count: 205 10*3/uL (ref 150–400)
RBC: 4.06 MIL/uL — ABNORMAL LOW (ref 4.22–5.81)
RDW: 13.8 % (ref 11.5–15.5)
WBC Count: 3.9 10*3/uL — ABNORMAL LOW (ref 4.0–10.5)
nRBC: 0 % (ref 0.0–0.2)

## 2022-08-24 LAB — CMP (CANCER CENTER ONLY)
ALT: 20 U/L (ref 0–44)
AST: 29 U/L (ref 15–41)
Albumin: 4 g/dL (ref 3.5–5.0)
Alkaline Phosphatase: 79 U/L (ref 38–126)
Anion gap: 7 (ref 5–15)
BUN: 11 mg/dL (ref 8–23)
CO2: 23 mmol/L (ref 22–32)
Calcium: 8.9 mg/dL (ref 8.9–10.3)
Chloride: 106 mmol/L (ref 98–111)
Creatinine: 1.08 mg/dL (ref 0.61–1.24)
GFR, Estimated: >60 mL/min (ref >60)
Glucose, Bld: 110 mg/dL — ABNORMAL HIGH (ref 70–99)
Potassium: 3.3 mmol/L — ABNORMAL LOW (ref 3.5–5.1)
Sodium: 136 mmol/L (ref 135–145)
Total Bilirubin: 0.9 mg/dL (ref 0.3–1.2)
Total Protein: 6.6 g/dL (ref 6.5–8.1)

## 2022-08-24 MED ORDER — DEXTROSE 5 % IV SOLN
Freq: Once | INTRAVENOUS | Status: AC
  Filled 2022-08-24: qty 250

## 2022-08-24 MED ORDER — LONCASTUXIMAB TESIRINE-LPYL CHEMO INJECTION 10 MG
0.0750 mg/kg | Freq: Once | INTRAVENOUS | Status: AC
  Administered 2022-08-24: 6.1 mg via INTRAVENOUS
  Filled 2022-08-24: qty 1.22

## 2022-08-24 MED ORDER — HEPARIN SOD (PORK) LOCK FLUSH 100 UNIT/ML IV SOLN
500.0000 [IU] | Freq: Once | INTRAVENOUS | Status: AC | PRN
  Administered 2022-08-24: 500 [IU]
  Filled 2022-08-24: qty 5

## 2022-08-24 MED ORDER — SODIUM CHLORIDE 0.9 % IV SOLN
10.0000 mg | Freq: Once | INTRAVENOUS | Status: AC
  Administered 2022-08-24: 10 mg via INTRAVENOUS
  Filled 2022-08-24: qty 10

## 2022-08-24 MED ORDER — SODIUM CHLORIDE 0.9% FLUSH
10.0000 mL | INTRAVENOUS | Status: DC | PRN
  Administered 2022-08-24: 10 mL
  Filled 2022-08-24: qty 10

## 2022-08-24 NOTE — Patient Instructions (Signed)
[facility]  Discharge Instructions: Thank you for choosing [facility] to provide your oncology and hematology care.  If you have a lab appointment with the Cancer Center, please go directly to the Cancer Center and check in at the registration area.  Wear comfortable clothing and clothing appropriate for easy access to any Portacath or PICC line.   We strive to give you quality time with your provider. You may need to reschedule your appointment if you arrive late (15 or more minutes).  Arriving late affects you and other patients whose appointments are after yours.  Also, if you miss three or more appointments without notifying the office, you may be dismissed from the clinic at the provider's discretion.      For prescription refill requests, have your pharmacy contact our office and allow 72 hours for refills to be completed.    Today you received the following chemotherapy and/or immunotherapy agents- Zynlonta       To help prevent nausea and vomiting after your treatment, we encourage you to take your nausea medication as directed.  BELOW ARE SYMPTOMS THAT SHOULD BE REPORTED IMMEDIATELY: *FEVER GREATER THAN 100.4 F (38 C) OR HIGHER *CHILLS OR SWEATING *NAUSEA AND VOMITING THAT IS NOT CONTROLLED WITH YOUR NAUSEA MEDICATION *UNUSUAL SHORTNESS OF BREATH *UNUSUAL BRUISING OR BLEEDING *URINARY PROBLEMS (pain or burning when urinating, or frequent urination) *BOWEL PROBLEMS (unusual diarrhea, constipation, pain near the anus) TENDERNESS IN MOUTH AND THROAT WITH OR WITHOUT PRESENCE OF ULCERS (sore throat, sores in mouth, or a toothache) UNUSUAL RASH, SWELLING OR PAIN  UNUSUAL VAGINAL DISCHARGE OR ITCHING   Items with * indicate a potential emergency and should be followed up as soon as possible or go to the Emergency Department if any problems should occur.  Please show the CHEMOTHERAPY ALERT CARD or IMMUNOTHERAPY ALERT CARD at check-in  to the Emergency Department and triage nurse.  Should you have questions after your visit or need to cancel or reschedule your appointment, please contact [place] [facility]  [phone] and follow the prompts.  Office hours are 8:00 a.m. to 4:30 p.m. Monday - Friday. Please note that voicemails left after 4:00 p.m. may not be returned until the following business day.  We are closed weekends and major holidays. You have access to a nurse at all times for urgent questions. Please call the main number to the clinic [phone] and follow the prompts.  For any non-urgent questions, you may also contact your provider using MyChart. We now offer e-Visits for anyone 2 and older to request care online for non-urgent symptoms. For details visit [email or web address].   Also download the MyChart app! Go to the app store, search "MyChart", open the app, select [place], and log in with your MyChart username and password.  Loncastuximab Tesirine Injection What is this medication? LONCASTUXIMAB TESIRINE (LON kas TUX i mab TES ir een) treats lymphoma. It works by blocking a protein that causes cancer cells to grow and multiply. This helps to slow or stop the spread of cancer cells. This medicine may be used for other purposes; ask your health care provider or pharmacist if you have questions. COMMON BRAND NAME(S): ZYNLONTA What should I tell my care team before I take this medication? They need to know if you have any of these conditions: Infection Liver disease An unusual or allergic reaction to loncastuximab tesirine, other medications, foods, dyes, or preservatives If you or your partner are  pregnant or trying to get pregnant Breast-feeding How should I use this medication? This medication is injected into a vein. It is given by your care team in a hospital or clinic setting. Talk to your care team about the use of this medication in children. Special care may be  needed. Overdosage: If you think you have taken too much of this medicine contact a poison control center or emergency room at once. NOTE: This medicine is only for you. Do not share this medicine with others. What if I miss a dose? Keep appointments for follow-up doses. It is important not to miss your dose. Call your care team if you are unable to keep an appointment. What may interact with this medication? Interactions have not been studied. This list may not describe all possible interactions. Give your health care provider a list of all the medicines, herbs, non-prescription drugs, or dietary supplements you use. Also tell them if you smoke, drink alcohol, or use illegal drugs. Some items may interact with your medicine. What should I watch for while using this medication? Your condition will be monitored carefully while you are receiving this medication. You may need blood work while taking this medication. This medication can make you more sensitive to the sun. Keep out of the sun. If you cannot avoid being in the sun, wear protective clothing and sunscreen. Do not use sun lamps, tanning beds, or tanning booths. Talk to your care team if you or your partner wish to become pregnant or think either of you might be pregnant. This medication can cause serious birth defects if taken during pregnancy and for 10 months after the last dose. A negative pregnancy test is required before starting this medication. A reliable form of contraception is recommended while taking this medication and for 10 months after the last dose. Talk to your care team about effective forms of contraception. Do not father a child while taking this medication and for 7 months after the last dose. Use a condom while having sex during this time period. Do not breastfeed while taking this medication and for 3 months after the last dose. This medication may cause infertility. Talk to your care team if you are concerned about your  fertility. What side effects may I notice from receiving this medication? Side effects that you should report to your care team as soon as possible: Allergic reactions--skin rash, itching, hives, swelling of the face, lips, tongue, or throat Infection--fever, chills, cough, sore throat, wounds that don't heal, pain or trouble when passing urine, general feeling of discomfort or being unwell Low red blood cell level--unusual weakness or fatigue, dizziness, headache, trouble breathing Skin reactions on sun-exposed areas Swelling of the ankles, hands, or feet, shortness of breath or trouble breathing, sudden weight gain Unusual bruising or bleeding Side effects that usually do not require medical attention (report to your care team if they continue or are bothersome): Fatigue Muscle pain Nausea Skin rash This list may not describe all possible side effects. Call your doctor for medical advice about side effects. You may report side effects to FDA at 1-800-FDA-1088. Where should I keep my medication? This medication is given in a hospital or clinic. It will not be stored at home. NOTE: This sheet is a summary. It may not cover all possible information. If you have questions about this medicine, talk to your doctor, pharmacist, or health care provider.  2024 Elsevier/Gold Standard (2021-07-07 00:00:00)

## 2022-08-26 ENCOUNTER — Inpatient Hospital Stay: Payer: Medicaid Other

## 2022-08-26 DIAGNOSIS — C8510 Unspecified B-cell lymphoma, unspecified site: Secondary | ICD-10-CM

## 2022-08-26 DIAGNOSIS — Z5112 Encounter for antineoplastic immunotherapy: Secondary | ICD-10-CM | POA: Diagnosis not present

## 2022-08-26 MED ORDER — PEGFILGRASTIM-CBQV 6 MG/0.6ML ~~LOC~~ SOSY
6.0000 mg | PREFILLED_SYRINGE | Freq: Once | SUBCUTANEOUS | Status: AC
  Administered 2022-08-26: 6 mg via SUBCUTANEOUS
  Filled 2022-08-26: qty 0.6

## 2022-08-27 ENCOUNTER — Other Ambulatory Visit (HOSPITAL_COMMUNITY): Payer: Self-pay

## 2022-08-30 ENCOUNTER — Encounter (HOSPITAL_COMMUNITY): Payer: Self-pay

## 2022-08-30 ENCOUNTER — Other Ambulatory Visit (HOSPITAL_COMMUNITY): Payer: Self-pay

## 2022-09-01 ENCOUNTER — Other Ambulatory Visit (HOSPITAL_COMMUNITY): Payer: Self-pay

## 2022-09-10 ENCOUNTER — Telehealth: Payer: Self-pay | Admitting: *Deleted

## 2022-09-10 NOTE — Telephone Encounter (Signed)
Pt called about  

## 2022-09-10 NOTE — Telephone Encounter (Signed)
Pt called the office and says he has itching area on skin and then it becomes a bump and some of them burst and drain. He puts ice on sometime. He wants to have MD to look at it like it is a rash and is it from chemo or something else going on. He has appt on Monday 7/1 at 9:30. Pt agreeable to this

## 2022-09-13 ENCOUNTER — Inpatient Hospital Stay: Payer: Medicaid Other | Attending: Oncology | Admitting: Hospice and Palliative Medicine

## 2022-09-13 ENCOUNTER — Other Ambulatory Visit: Payer: Self-pay

## 2022-09-13 ENCOUNTER — Encounter: Payer: Self-pay | Admitting: Hospice and Palliative Medicine

## 2022-09-13 ENCOUNTER — Encounter: Payer: Self-pay | Admitting: Oncology

## 2022-09-13 VITALS — BP 132/87 | HR 67 | Temp 96.9°F | Resp 18 | Ht 73.2 in | Wt 177.0 lb

## 2022-09-13 DIAGNOSIS — Z5112 Encounter for antineoplastic immunotherapy: Secondary | ICD-10-CM | POA: Insufficient documentation

## 2022-09-13 DIAGNOSIS — Z832 Family history of diseases of the blood and blood-forming organs and certain disorders involving the immune mechanism: Secondary | ICD-10-CM | POA: Diagnosis not present

## 2022-09-13 DIAGNOSIS — Z7952 Long term (current) use of systemic steroids: Secondary | ICD-10-CM | POA: Insufficient documentation

## 2022-09-13 DIAGNOSIS — Z515 Encounter for palliative care: Secondary | ICD-10-CM | POA: Insufficient documentation

## 2022-09-13 DIAGNOSIS — Z5189 Encounter for other specified aftercare: Secondary | ICD-10-CM | POA: Diagnosis not present

## 2022-09-13 DIAGNOSIS — R590 Localized enlarged lymph nodes: Secondary | ICD-10-CM | POA: Insufficient documentation

## 2022-09-13 DIAGNOSIS — Z87891 Personal history of nicotine dependence: Secondary | ICD-10-CM | POA: Diagnosis not present

## 2022-09-13 DIAGNOSIS — Z923 Personal history of irradiation: Secondary | ICD-10-CM | POA: Insufficient documentation

## 2022-09-13 DIAGNOSIS — Z79899 Other long term (current) drug therapy: Secondary | ICD-10-CM | POA: Diagnosis not present

## 2022-09-13 DIAGNOSIS — Z8249 Family history of ischemic heart disease and other diseases of the circulatory system: Secondary | ICD-10-CM | POA: Diagnosis not present

## 2022-09-13 DIAGNOSIS — Z7961 Long term (current) use of immunomodulator: Secondary | ICD-10-CM | POA: Diagnosis not present

## 2022-09-13 DIAGNOSIS — Z820 Family history of epilepsy and other diseases of the nervous system: Secondary | ICD-10-CM | POA: Insufficient documentation

## 2022-09-13 DIAGNOSIS — R21 Rash and other nonspecific skin eruption: Secondary | ICD-10-CM

## 2022-09-13 DIAGNOSIS — Z5986 Financial insecurity: Secondary | ICD-10-CM | POA: Diagnosis not present

## 2022-09-13 DIAGNOSIS — C8331 Diffuse large B-cell lymphoma, lymph nodes of head, face, and neck: Secondary | ICD-10-CM | POA: Insufficient documentation

## 2022-09-13 DIAGNOSIS — Z8349 Family history of other endocrine, nutritional and metabolic diseases: Secondary | ICD-10-CM | POA: Insufficient documentation

## 2022-09-13 DIAGNOSIS — C8510 Unspecified B-cell lymphoma, unspecified site: Secondary | ICD-10-CM

## 2022-09-13 DIAGNOSIS — Z809 Family history of malignant neoplasm, unspecified: Secondary | ICD-10-CM | POA: Diagnosis not present

## 2022-09-13 DIAGNOSIS — D709 Neutropenia, unspecified: Secondary | ICD-10-CM | POA: Insufficient documentation

## 2022-09-13 MED ORDER — TRIAMCINOLONE ACETONIDE 0.5 % EX OINT
1.0000 | TOPICAL_OINTMENT | Freq: Two times a day (BID) | CUTANEOUS | 0 refills | Status: DC
  Filled 2022-09-13: qty 30, 15d supply, fill #0

## 2022-09-13 MED ORDER — DOXYCYCLINE HYCLATE 100 MG PO TABS
100.0000 mg | ORAL_TABLET | Freq: Two times a day (BID) | ORAL | 0 refills | Status: DC
  Filled 2022-09-13: qty 14, 7d supply, fill #0

## 2022-09-13 MED FILL — Dexamethasone Sodium Phosphate Inj 100 MG/10ML: INTRAMUSCULAR | Qty: 1 | Status: AC

## 2022-09-13 NOTE — Progress Notes (Signed)
Symptom Management Clinic [facility] at [facility] [phone] [phone]  Patient Care Team: Sherrie Mustache, MD as PCP - General (Internal Medicine) Creig Hines, MD as Consulting Physician (Hematology and Oncology) Leafy Ro, MD as Consulting Physician (General Surgery)   NAME OF PATIENT: Christopher Mills  [id number]  04/14/1960   DATE OF VISIT: 09/13/22  REASON FOR CONSULT: KILE JUETT is a 62 y.o. 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  Patient has had progression on previous lines of chemotherapy.  Patient most recently started on fourth line loncastuximab.   INTERVAL HISTORY: Patient saw Dr. Smith Robert on 08/17/22 and was started on dose reduced Zynlonta due to disease progression.  Patient received cycle 1 on 08/24/2022.  Patient presents [facility] today for evaluation small pruritic ulcers on forearms and legs.  He states that these have been present with varying degrees of healing over the last 6 months or so.  He states they come and go.  He feels like they are at least partially worsened by sun exposure.  He is taking Benadryl and applying topical hydrocortisone cream, which he states helps.  He also obtained an over-the-counter psoriasis cream which she says helps some.  He also uses ice packs.  Denies any neurologic complaints. Denies recent fevers or illnesses. Denies any easy bleeding or bruising. Reports good appetite and denies weight loss. Denies chest pain. Denies any nausea, vomiting, constipation, or diarrhea. Denies urinary complaints. Patient offers no further specific complaints today.   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    PAST SURGICAL HISTORY:  Past Surgical History:  Procedure Laterality Date   BONE  MARROW BIOPSY     COLONOSCOPY     EXCISION MASS NECK Right 06/12/2020   Procedure: EXCISION MASS NECK;  Surgeon: Leafy Ro, MD;  Location: [facility] ORS;  Service: General;  Laterality: Right;   HERNIA REPAIR Right    at age 22-RIH   69 CATH INSERTION     PORTACATH PLACEMENT Right 06/24/2020   Procedure: INSERTION PORT-A-CATH;  Surgeon: Leafy Ro, MD;  Location: [facility] ORS;  Service: General;  Laterality: Right;    HEMATOLOGY/ONCOLOGY HISTORY:  Oncology History  High grade B-cell lymphoma (HCC)  06/18/2020 Initial Diagnosis   High grade B-cell lymphoma (HCC)   06/18/2020 Cancer Staging   Staging form: Hodgkin and Non-Hodgkin Lymphoma, AJCC 8th Edition - Clinical stage from 06/18/2020: Stage IV (Diffuse large B-cell lymphoma) - Signed by Creig Hines, MD on 06/18/2020 Stage prefix: Initial diagnosis   06/21/2020 - 06/21/2020 Chemotherapy         07/03/2020 - 07/08/2020 Chemotherapy         08/12/2020 - 12/05/2020 Chemotherapy   Patient is on Treatment Plan :  NON-HODGKINS LYMPHOMA R-EPOCH q21d     08/17/2021 - 10/12/2021 Chemotherapy   Patient is on Treatment Plan : NON-HODGKIN'S LYMPHOMA DLBCL RELAPSED/REFRACTORY Tafasitamab-cxix + Lenalidomide q28d / Tafasitamab-cxix Maintenance q28d     10/29/2021 - 11/02/2021 Chemotherapy   Patient is on Treatment Plan : NON-HODGKINS LYMPHOMA RELAPSED/ REFRACTORY DLBCL Polatuzumab + Bendamustine + Rituximab q21d x 6 cycles     11/12/2021 - 11/12/2021 Chemotherapy   Patient is on Treatment Plan : NON-HODGKINS LYMPHOMA RELAPSED/ REFRACTORY DLBCL Polatuzumab D1 + Bendamustine D1,2 + Rituximab D1 q21d x  6 cycles     11/18/2021 - 01/01/2022 Chemotherapy   Patient is on Treatment Plan : NON-HODGKINS LYMPHOMA RELAPSED/ REFRACTORY DLBCL Polatuzumab D1 + Bendamustine D1,2 + Rituximab D1 q21d x 6 cycles     08/24/2022 -  Chemotherapy   Patient is on Treatment Plan : LYMPHOMA Loncastuximab tesirine D1 q21d       ALLERGIES:  has No Known  Allergies.  MEDICATIONS:  Current Outpatient Medications  Medication Sig Dispense Refill   allopurinol (ZYLOPRIM) 300 MG tablet TAKE 1 TABLET BY MOUTH EVERY DAY 30 tablet 1   aspirin EC 81 MG tablet Take 1 tablet (81 mg total) by mouth daily. Swallow whole. 30 tablet 0   atenolol (TENORMIN) 25 MG tablet Take 25 mg by mouth daily.     cholecalciferol (VITAMIN D3) 25 MCG (1000 UNIT) tablet Take 1,000 Units by mouth daily.     dexamethasone (DECADRON) 4 MG tablet Take 1 tablet (4 mg total) by mouth 2 (two) times daily. Take for 1 day before Loncastuximab, and for 1 day after. 6 tablet 11   diphenhydrAMINE (BENADRYL) 2 % cream Apply 1 Application topically 3 (three) times daily as needed for itching.     diphenhydrAMINE (BENADRYL) 25 mg capsule Take 25 mg by mouth every 6 (six) hours as needed for itching or allergies.     lisinopril (ZESTRIL) 10 MG tablet Take 10 mg by mouth daily.     Omega-3 Fatty Acids (FISH OIL ADULT GUMMIES PO) Take by mouth.     ondansetron (ZOFRAN) 8 MG tablet Take 1 tablet (8 mg total) by mouth every 8 (eight) hours as needed for nausea or vomiting. 30 tablet 1   prochlorperazine (COMPAZINE) 10 MG tablet Take 1 tablet (10 mg total) by mouth every 6 (six) hours as needed for nausea or vomiting. 30 tablet 1   Tenofovir Alafenamide Fumarate (VEMLIDY) 25 MG TABS Take 1 tablet (25 mg total) by mouth daily. Take with food. 30 tablet 2   vitamin C (ASCORBIC ACID) 500 MG tablet Take 500 mg by mouth daily.     No current facility-administered medications for this visit.   Facility-Administered Medications Ordered in Other Visits  Medication Dose Route Frequency Provider Last Rate Last Admin   0.9 %  sodium chloride infusion   Intravenous Continuous Creig Hines, MD   Stopped at 09/09/20 1507   0.9 %  sodium chloride infusion   Intravenous Continuous Earna Coder, MD   Stopped at 09/11/20 1502   0.9 %  sodium chloride infusion   Intravenous Continuous Creig Hines,  MD   Stopped at 11/06/20 1424   heparin lock flush 100 unit/mL  500 Units Intravenous Once Creig Hines, MD       methotrexate (PF) 12 mg in sodium chloride (PF) 0.9 % INTRATHECAL chemo injection   Intrathecal Once Creig Hines, MD        VITAL SIGNS: BP 132/87   Pulse 67   Temp (!) 96.9 F (36.1 C) (Tympanic)   Resp 18   Ht 6' 1.2" (1.859 m)   Wt 177 lb (80.3 kg)   BMI 23.22 kg/m  Filed Weights   09/13/22 0930  Weight: 177 lb (80.3 kg)    Estimated body mass index is 23.22 kg/m as calculated from the following:   Height as of this encounter: 6' 1.2" (1.859 m).   Weight as of this encounter: 177 lb (80.3 kg).  LABS: CBC:    Component Value Date/Time  WBC 3.9 (L) 08/24/2022 0913   WBC 2.4 (L) 08/17/2022 1303   HGB 13.0 08/24/2022 0913   HGB 13.1 07/03/2020 0935   HCT 37.0 (L) 08/24/2022 0913   HCT 40.1 02/19/2013 1716   PLT 205 08/24/2022 0913   PLT 274 02/19/2013 1716   MCV 91.1 08/24/2022 0913   MCV 86 02/19/2013 1716   NEUTROABS 2.1 08/24/2022 0913   LYMPHSABS 1.1 08/24/2022 0913   MONOABS 0.5 08/24/2022 0913   EOSABS 0.2 08/24/2022 0913   BASOSABS 0.0 08/24/2022 0913   Comprehensive Metabolic Panel:    Component Value Date/Time   NA 136 08/24/2022 0913   NA 135 (L) 02/19/2013 1716   K 3.3 (L) 08/24/2022 0913   K 3.5 02/19/2013 1716   CL 106 08/24/2022 0913   CL 102 02/19/2013 1716   CO2 23 08/24/2022 0913   CO2 27 02/19/2013 1716   BUN 11 08/24/2022 0913   BUN 16 02/19/2013 1716   CREATININE 1.08 08/24/2022 0913   CREATININE 1.12 02/19/2013 1716   GLUCOSE 110 (H) 08/24/2022 0913   GLUCOSE 94 02/19/2013 1716   CALCIUM 8.9 08/24/2022 0913   CALCIUM 9.6 02/19/2013 1716   AST 29 08/24/2022 0913   ALT 20 08/24/2022 0913   ALT 41 02/19/2013 1716   ALKPHOS 79 08/24/2022 0913   ALKPHOS 79 02/19/2013 1716   BILITOT 0.9 08/24/2022 0913   PROT 6.6 08/24/2022 0913   PROT 7.9 02/19/2013 1716   ALBUMIN 4.0 08/24/2022 0913   ALBUMIN 4.2 02/19/2013 1716     RADIOGRAPHIC STUDIES: No results found.  PERFORMANCE STATUS (ECOG) : 1 - Symptomatic but completely ambulatory  Review of Systems Unless otherwise noted, a complete review of systems is negative.  Physical Exam General: NAD Cardiovascular: regular rate and rhythm Pulmonary: clear ant fields Abdomen: soft, nontender, + bowel sounds GU: no suprapubic tenderness Extremities: no edema, no joint deformities Skin: small ulcers to bilateral forearms Neurological: Nonfocal         IMPRESSION/PLAN: Rash -unclear etiology.  Chart review shows that this has been a chronic problem, which Dr. Smith Robert noted at time of last visit.  Additionally, there was previous concern for possible drug rash.  However, this seems less likely now given chronicity of symptoms.  Question if this could be related to his lymphoma given recent progression.  Discussed with Dr. Alena Bills who is seeing patient tomorrow for Dr. Smith Robert.  Will start patient on short course of doxycycline and triamcinolone cream.  If no improvement, could consider referral to dermatology for biopsy.   Patient expressed understanding and was in agreement with this plan. He also understands that He can call clinic at any time with any questions, concerns, or complaints.   Thank you for allowing me to participate in the care of this very pleasant patient.   Time Total: 15 minutes  Visit consisted of counseling and education dealing with the complex and emotionally intense issues of symptom management in the setting of serious illness.Greater than 50%  of this time was spent counseling and coordinating care related to the above assessment and plan.  Signed by: Laurette Schimke, PhD, NP-C

## 2022-09-14 ENCOUNTER — Inpatient Hospital Stay: Payer: Medicaid Other

## 2022-09-14 ENCOUNTER — Inpatient Hospital Stay (HOSPITAL_BASED_OUTPATIENT_CLINIC_OR_DEPARTMENT_OTHER): Payer: Medicaid Other | Admitting: Internal Medicine

## 2022-09-14 DIAGNOSIS — C8510 Unspecified B-cell lymphoma, unspecified site: Secondary | ICD-10-CM

## 2022-09-14 DIAGNOSIS — Z5112 Encounter for antineoplastic immunotherapy: Secondary | ICD-10-CM | POA: Diagnosis not present

## 2022-09-14 LAB — CMP (CANCER CENTER ONLY)
ALT: 21 U/L (ref 0–44)
AST: 35 U/L (ref 15–41)
Albumin: 4.1 g/dL (ref 3.5–5.0)
Alkaline Phosphatase: 98 U/L (ref 38–126)
Anion gap: 9 (ref 5–15)
BUN: 11 mg/dL (ref 8–23)
CO2: 24 mmol/L (ref 22–32)
Calcium: 9.3 mg/dL (ref 8.9–10.3)
Chloride: 104 mmol/L (ref 98–111)
Creatinine: 0.96 mg/dL (ref 0.61–1.24)
GFR, Estimated: >60 mL/min (ref >60)
Glucose, Bld: 102 mg/dL — ABNORMAL HIGH (ref 70–99)
Potassium: 3.9 mmol/L (ref 3.5–5.1)
Sodium: 137 mmol/L (ref 135–145)
Total Bilirubin: 0.4 mg/dL (ref 0.3–1.2)
Total Protein: 7.1 g/dL (ref 6.5–8.1)

## 2022-09-14 LAB — CBC WITH DIFFERENTIAL (CANCER CENTER ONLY)
Abs Immature Granulocytes: 0.06 10*3/uL (ref 0.00–0.07)
Basophils Absolute: 0 10*3/uL (ref 0.0–0.1)
Basophils Relative: 0 %
Eosinophils Absolute: 0 10*3/uL (ref 0.0–0.5)
Eosinophils Relative: 0 %
HCT: 36.3 % — ABNORMAL LOW (ref 39.0–52.0)
Hemoglobin: 12.5 g/dL — ABNORMAL LOW (ref 13.0–17.0)
Immature Granulocytes: 1 %
Lymphocytes Relative: 13 %
Lymphs Abs: 0.7 10*3/uL (ref 0.7–4.0)
MCH: 32.1 pg (ref 26.0–34.0)
MCHC: 34.4 g/dL (ref 30.0–36.0)
MCV: 93.3 fL (ref 80.0–100.0)
Monocytes Absolute: 0.5 10*3/uL (ref 0.1–1.0)
Monocytes Relative: 8 %
Neutro Abs: 4.4 10*3/uL (ref 1.7–7.7)
Neutrophils Relative %: 78 %
Platelet Count: 229 10*3/uL (ref 150–400)
RBC: 3.89 MIL/uL — ABNORMAL LOW (ref 4.22–5.81)
RDW: 15.5 % (ref 11.5–15.5)
WBC Count: 5.6 10*3/uL (ref 4.0–10.5)
nRBC: 0 % (ref 0.0–0.2)

## 2022-09-14 MED ORDER — HEPARIN SOD (PORK) LOCK FLUSH 100 UNIT/ML IV SOLN
500.0000 [IU] | Freq: Once | INTRAVENOUS | Status: AC | PRN
  Administered 2022-09-14: 500 [IU]
  Filled 2022-09-14: qty 5

## 2022-09-14 MED ORDER — LONCASTUXIMAB TESIRINE-LPYL CHEMO INJECTION 10 MG
0.0750 mg/kg | Freq: Once | INTRAVENOUS | Status: AC
  Administered 2022-09-14: 6.1 mg via INTRAVENOUS
  Filled 2022-09-14: qty 1.22

## 2022-09-14 MED ORDER — SODIUM CHLORIDE 0.9 % IV SOLN
10.0000 mg | Freq: Once | INTRAVENOUS | Status: AC
  Administered 2022-09-14: 10 mg via INTRAVENOUS
  Filled 2022-09-14: qty 10

## 2022-09-14 MED ORDER — DEXTROSE 5 % IV SOLN
Freq: Once | INTRAVENOUS | Status: AC
  Filled 2022-09-14: qty 250

## 2022-09-14 NOTE — Progress Notes (Signed)
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  This was followed by a PET CT scan which showed extensive FDG avid adenopathy in the neck chest abdomen and pelvis.  FDG avid splenic lesions.  Solitary intramuscular FDG avid lesion in the right biceps femoris muscle.   Supraclavicular excisional lymph node biopsy showed high-grade B-cell lymphoma germinal center type Ki-67 greater than 95%.  FISH testing was positive for Bcl-2 BCL6 and MYC consistent with triple hit lymphoma.  By NCCN IPI score would be 4 based on age and elevated LDH and stage IV (intramuscular biceps femoris lesion) which puts him in the high intermediate risk group. CNS IPI score 4    Bone marrow biopsy showed involvement with low-grade B-cell lymphoproliferative disorder with no evidence of High-grade B-cell lymphoma.   Patient received RCHOP for cycle 1 with plans for DA [facility] with cycle 2. IT MTX for CNS prophylaxis and he has received 3 cycles but declined further cycles.  MRI brain negative for lymphoma.  Chemotherapy was not being able to escalated upwards due to neutropenia which has persisted to 3 weeks and he has been receiving treatment every 4 weeks   PET CT scan after 3 cycles of  chemotherapy showedComplete or near complete metabolic response.  Residual disease in the anterior upper right mediastinum slightly greater than background mediastinal activity   Patient found to have a clinically palpable right supraclavicular lymph node which was subsequently biopsied and was consistent with previously diagnosed lymphoma.  PET CT scan showedNew right level 5 adenopathy in the lower neck with dominant lymph node 1.6 cm Deauville 5.  No other findings of acute active lymphoma in chest abdomen pelvis and bones.  MRI brain negative for leptomeningeal disease.   Patient seen for second opinion by Dr. Aviva Kluver at [facility] for consideration of CAR-T cell therapy.  Patient did not wish to proceed with CAR-T cell treatment after reviewing risks versus benefits.  Moreover patient's health insurance will also not cover CAR-T cell therapy. Patient started on second line tafasitamab/Revlimid regimen in June 2023.  He is on Revlimid 15 mg 3 weeks on and 1 week off.  Disease progression after 3 cycles and patient was switched to [facility] regimen     Patient was noted to haveSignificant decrease in the size of supraclavicular lymph nodes consistent with treatment response Deauville 3 after 3 cycles of Pola BR chemotherapy.  Patient has only received 4 cycles of chemotherapy so far due to persistent neutropenia despite receiving growth factor support.  Repeat PET scan showed new/recurrent hypermetabolic subcutaneous tissue in the high right back/inferior neck consistent with recurrent lymphoma Deauville 5.  He received palliative radiation to that area.    Interval history-patient seen today prior to cycle 2 of the treatment. He was seen in symptom management yesterday with complaints of maculopapular  rash on his arms and legs.  Causing itching.  He was given doxycycline and triamcinolone cream.  Reports his rash feels better.  Reports the swelling in his right scapular area is no longer there.  Has been  feeling good overall.  ECOG PS- 0 Pain scale- 0 Opioid associated constipation- no  Review of systems- Review of Systems  Constitutional:  Negative for chills, fever, malaise/fatigue and weight loss.  HENT:  Negative for congestion, ear discharge and nosebleeds.   Eyes:  Negative for blurred vision.  Respiratory:  Negative for cough, hemoptysis, sputum production, shortness of breath and wheezing.   Cardiovascular:  Negative for chest pain, palpitations, orthopnea and claudication.  Gastrointestinal:  Negative for abdominal pain, blood in stool, constipation, diarrhea, heartburn, melena, nausea and vomiting.  Genitourinary:  Negative for dysuria, flank pain, frequency, hematuria and urgency.  Musculoskeletal:  Negative for back pain, joint pain and myalgias.  Skin:  Negative for rash.  Neurological:  Negative for dizziness, tingling, focal weakness, seizures, weakness and headaches.  Endo/Heme/Allergies:  Does not bruise/bleed easily.  Psychiatric/Behavioral:  Negative for depression and suicidal ideas. The patient does not have insomnia.       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     Past Surgical History:  Procedure Laterality Date   BONE MARROW BIOPSY     COLONOSCOPY     EXCISION MASS NECK Right 06/12/2020   Procedure: EXCISION MASS NECK;  Surgeon: Leafy Ro, MD;  Location: [facility] ORS;  Service: General;  Laterality: Right;   HERNIA REPAIR Right    at age 78-RIH   72 CATH INSERTION     PORTACATH PLACEMENT Right 06/24/2020   Procedure: INSERTION PORT-A-CATH;  Surgeon: Leafy Ro, MD;  Location: [facility] ORS;  Service: General;  Laterality: Right;    Social History   Socioeconomic History   Marital status: Single    Spouse name: Not on file   Number of children: Not on file   Years of education: Not on file   Highest  education level: Not on file  Occupational History   Not on file  Tobacco Use   Smoking status: Former    Types: Cigarettes    Quit date: 06/13/2020    Years since quitting: 2.2   Smokeless tobacco: Never  Vaping Use   Vaping Use: Never used  Substance and Sexual Activity   Alcohol use: Not Currently   Drug use: Not Currently    Types: Marijuana   Sexual activity: Not Currently  Other Topics Concern   Not on file  Social History Narrative   Has daughter and grandson in the home   Social Determinants of Health   Financial Resource Strain: Not on file  Food Insecurity: Not on file  Transportation Needs: Not on file  Physical Activity: Not on file  Stress: Not on file  Social Connections: Not on file  Intimate Partner Violence: Not on file    Family History  Problem Relation Age of Onset   Anemia Mother    Hypertension Mother    Goiter Mother    Cancer Father    Cancer Sister    Multiple sclerosis Sister      Current Outpatient Medications:    triamcinolone ointment (KENALOG) 0.5 %, Apply 1 Application topically 2 (two) times daily., Disp: 30 g, Rfl:  0   vitamin C (ASCORBIC ACID) 500 MG tablet, Take 500 mg by mouth daily., Disp: , Rfl:    allopurinol (ZYLOPRIM) 300 MG tablet, TAKE 1 TABLET BY MOUTH EVERY DAY, Disp: 30 tablet, Rfl: 1   aspirin EC 81 MG tablet, Take 1 tablet (81 mg total) by mouth daily. Swallow whole., Disp: 30 tablet, Rfl: 0   atenolol (TENORMIN) 25 MG tablet, Take 25 mg by mouth daily., Disp: , Rfl:    cholecalciferol (VITAMIN D3) 25 MCG (1000 UNIT) tablet, Take 1,000 Units by mouth daily., Disp: , Rfl:    dexamethasone (DECADRON) 4 MG tablet, Take 1 tablet (4 mg total) by mouth 2 (two) times daily. Take for 1 day before Loncastuximab, and for 1 day after., Disp: 6 tablet, Rfl: 11   diphenhydrAMINE (BENADRYL) 2 % cream, Apply 1 Application topically 3 (three) times daily as needed for itching., Disp: , Rfl:    diphenhydrAMINE (BENADRYL) 25 mg capsule,  Take 25 mg by mouth every 6 (six) hours as needed for itching or allergies., Disp: , Rfl:    doxycycline (VIBRA-TABS) 100 MG tablet, Take 1 tablet (100 mg total) by mouth 2 (two) times daily., Disp: 14 tablet, Rfl: 0   lisinopril (ZESTRIL) 10 MG tablet, Take 10 mg by mouth daily., Disp: , Rfl:    Omega-3 Fatty Acids (FISH OIL ADULT GUMMIES PO), Take by mouth., Disp: , Rfl:    ondansetron (ZOFRAN) 8 MG tablet, Take 1 tablet (8 mg total) by mouth every 8 (eight) hours as needed for nausea or vomiting., Disp: 30 tablet, Rfl: 1   prochlorperazine (COMPAZINE) 10 MG tablet, Take 1 tablet (10 mg total) by mouth every 6 (six) hours as needed for nausea or vomiting., Disp: 30 tablet, Rfl: 1   Tenofovir Alafenamide Fumarate (VEMLIDY) 25 MG TABS, Take 1 tablet (25 mg total) by mouth daily. Take with food., Disp: 30 tablet, Rfl: 2 No current facility-administered medications for this visit.  Facility-Administered Medications Ordered in Other Visits:    0.9 %  sodium chloride infusion, , Intravenous, Continuous, Creig Hines, MD, Stopped at 09/09/20 1507   0.9 %  sodium chloride infusion, , Intravenous, Continuous, Earna Coder, MD, Stopped at 09/11/20 1502   0.9 %  sodium chloride infusion, , Intravenous, Continuous, Creig Hines, MD, Stopped at 11/06/20 1424   heparin lock flush 100 unit/mL, 500 Units, Intravenous, Once, Creig Hines, MD   [COMPLETED] heparin lock flush 100 unit/mL, 500 Units, Intracatheter, Once PRN, Creig Hines, MD, 500 Units at 09/14/22 1231   loncastuximab tesirine-lpyl (ZYNLONTA) 6.1 mg in dextrose 5 % 50 mL chemo infusion, 0.075 mg/kg (Treatment Plan Recorded), Intravenous, Once, Creig Hines, MD, Last Rate: 102.4 mL/hr at 09/14/22 1109, 6.1 mg at 09/14/22 1109   methotrexate (PF) 12 mg in sodium chloride (PF) 0.9 % INTRATHECAL chemo injection, , Intrathecal, Once, Creig Hines, MD  Physical exam:  Vitals:   09/14/22 1004  BP: 122/80  Pulse: 68  Temp: 97.7 F  (36.5 C)  TempSrc: Tympanic  SpO2: 100%  Weight: 172 lb 9.6 oz (78.3 kg)   Physical Exam Cardiovascular:     Rate and Rhythm: Normal rate and regular rhythm.     Heart sounds: Normal heart sounds.  Pulmonary:     Effort: Pulmonary effort is normal.     Breath sounds: Normal breath sounds.  Abdominal:     General: Bowel sounds are normal.     Palpations: Abdomen is soft.  Musculoskeletal:  Comments: Ill-defined subcutaneous mass about 3 cm felt over the right scapula and a 2 cm mass palpable under his right arm  Skin:    General: Skin is warm and dry.  Neurological:     Mental Status: He is alert and oriented to person, place, and time.         Latest Ref Rng & Units 09/14/2022    9:38 AM  CMP  Glucose 70 - 99 mg/dL 161   BUN 8 - 23 mg/dL 11   Creatinine 0.96 - 1.24 mg/dL 0.45   Sodium 409 - 811 mmol/L 137   Potassium 3.5 - 5.1 mmol/L 3.9   Chloride 98 - 111 mmol/L 104   CO2 22 - 32 mmol/L 24   Calcium 8.9 - 10.3 mg/dL 9.3   Total Protein 6.5 - 8.1 g/dL 7.1   Total Bilirubin 0.3 - 1.2 mg/dL 0.4   Alkaline Phos 38 - 126 U/L 98   AST 15 - 41 U/L 35   ALT 0 - 44 U/L 21       Latest Ref Rng & Units 09/14/2022    9:38 AM  CBC  WBC 4.0 - 10.5 K/uL 5.6   Hemoglobin 13.0 - 17.0 g/dL 91.4   Hematocrit 78.2 - 52.0 % 36.3   Platelets 150 - 400 K/uL 229       No results found.   Assessment and plan- Patient is a 62 y.o. 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  He is here to discuss further management  Patient last received for cycle 4 of Pola BR chemotherapy in October 2023.  Plan was to give him 6 cycles but due to prolonged neutropenia this could not be continued.  Bone marrow biopsy did not reveal any evidence of lymphoma or other cause of neutropenia and this was likely secondary to chemotherapy.  Moreover patient has baseline benign ethnic neutropenia even prior to starting treatment.  He was found to have recurrent disease in  his right neck in December 2023 and therefore underwent palliative radiation to that area while we were awaiting count recovery.  PET scan in February 2024 showed resolution of these areas with a Deauville score of 1.  I have reviewed recent PET CT scan images independently and discussed findings with the patient that unfortunately recurrent disease in the right shoulder musculature and clinically this consistent with recurrent lymphoma.  Patient has small ulcerated nodules in his bilateral forearms which do not appear clinically as lymphoma but we will continue to keep an eye on these.  Patient has now had disease progression on R-EPOCH chemotherapy, to facet nerve and more recently on Pola BR chemotherapy.    Labs reviewed and acceptable for treatment.  Will proceed with cycle 2 of loncastuximab.  He will return on Friday for G-CSF injection.  He is scheduled to follow-up with Dr. Daleen Squibb in 3 weeks.  Rash -on bilateral arms and legs.  Was seen in [facility] yesterday.  Was given doxycycline and triamcinolone cream.  Reports that the rash is improving on it.  Will continue to monitor.  Visit Diagnosis 1. High grade B-cell lymphoma (HCC)      09/14/2022 11:13 AM

## 2022-09-14 NOTE — Patient Instructions (Signed)
[facility]  Discharge Instructions: Thank you for choosing [facility] to provide your oncology and hematology care.  If you have a lab appointment with the Cancer Center, please go directly to the Cancer Center and check in at the registration area.  Wear comfortable clothing and clothing appropriate for easy access to any Portacath or PICC line.   We strive to give you quality time with your provider. You may need to reschedule your appointment if you arrive late (15 or more minutes).  Arriving late affects you and other patients whose appointments are after yours.  Also, if you miss three or more appointments without notifying the office, you may be dismissed from the clinic at the provider's discretion.      For prescription refill requests, have your pharmacy contact our office and allow 72 hours for refills to be completed.    Today you received the following chemotherapy and/or immunotherapy agents Zynlonta      To help prevent nausea and vomiting after your treatment, we encourage you to take your nausea medication as directed.  BELOW ARE SYMPTOMS THAT SHOULD BE REPORTED IMMEDIATELY: *FEVER GREATER THAN 100.4 F (38 C) OR HIGHER *CHILLS OR SWEATING *NAUSEA AND VOMITING THAT IS NOT CONTROLLED WITH YOUR NAUSEA MEDICATION *UNUSUAL SHORTNESS OF BREATH *UNUSUAL BRUISING OR BLEEDING *URINARY PROBLEMS (pain or burning when urinating, or frequent urination) *BOWEL PROBLEMS (unusual diarrhea, constipation, pain near the anus) TENDERNESS IN MOUTH AND THROAT WITH OR WITHOUT PRESENCE OF ULCERS (sore throat, sores in mouth, or a toothache) UNUSUAL RASH, SWELLING OR PAIN  UNUSUAL VAGINAL DISCHARGE OR ITCHING   Items with * indicate a potential emergency and should be followed up as soon as possible or go to the Emergency Department if any problems should occur.  Please show the CHEMOTHERAPY ALERT CARD or IMMUNOTHERAPY ALERT CARD at check-in to  the Emergency Department and triage nurse.  Should you have questions after your visit or need to cancel or reschedule your appointment, please contact [facility]  [phone] and follow the prompts.  Office hours are 8:00 a.m. to 4:30 p.m. Monday - Friday. Please note that voicemails left after 4:00 p.m. may not be returned until the following business day.  We are closed weekends and major holidays. You have access to a nurse at all times for urgent questions. Please call the main number to the clinic [phone] and follow the prompts.  For any non-urgent questions, you may also contact your provider using MyChart. We now offer e-Visits for anyone 18 and older to request care online for non-urgent symptoms. For details visit [email or web address].   Also download the MyChart app! Go to the app store, search "MyChart", open the app, select , and log in with your MyChart username and password.    

## 2022-09-17 ENCOUNTER — Inpatient Hospital Stay: Payer: Medicaid Other

## 2022-09-17 DIAGNOSIS — C8510 Unspecified B-cell lymphoma, unspecified site: Secondary | ICD-10-CM

## 2022-09-17 DIAGNOSIS — Z5112 Encounter for antineoplastic immunotherapy: Secondary | ICD-10-CM | POA: Diagnosis not present

## 2022-09-17 MED ORDER — PEGFILGRASTIM-CBQV 6 MG/0.6ML ~~LOC~~ SOSY
6.0000 mg | PREFILLED_SYRINGE | Freq: Once | SUBCUTANEOUS | Status: AC
  Administered 2022-09-17: 6 mg via SUBCUTANEOUS
  Filled 2022-09-17: qty 0.6

## 2022-09-21 ENCOUNTER — Other Ambulatory Visit (HOSPITAL_COMMUNITY): Payer: Self-pay

## 2022-09-22 ENCOUNTER — Other Ambulatory Visit (HOSPITAL_COMMUNITY): Payer: Self-pay

## 2022-10-01 ENCOUNTER — Encounter: Payer: Self-pay | Admitting: Oncology

## 2022-10-01 ENCOUNTER — Other Ambulatory Visit: Payer: Self-pay

## 2022-10-01 ENCOUNTER — Other Ambulatory Visit: Payer: Self-pay | Admitting: *Deleted

## 2022-10-01 MED ORDER — DEXAMETHASONE 4 MG PO TABS: ORAL_TABLET | ORAL | 3 refills | Status: DC

## 2022-10-04 ENCOUNTER — Encounter: Payer: Self-pay | Admitting: Oncology

## 2022-10-04 MED FILL — Dexamethasone Sodium Phosphate Inj 100 MG/10ML: INTRAMUSCULAR | Qty: 1 | Status: AC

## 2022-10-05 ENCOUNTER — Other Ambulatory Visit: Payer: Self-pay

## 2022-10-05 ENCOUNTER — Inpatient Hospital Stay (HOSPITAL_BASED_OUTPATIENT_CLINIC_OR_DEPARTMENT_OTHER): Payer: Medicaid Other | Admitting: Oncology

## 2022-10-05 ENCOUNTER — Encounter: Payer: Self-pay | Admitting: Oncology

## 2022-10-05 ENCOUNTER — Inpatient Hospital Stay: Payer: Medicaid Other

## 2022-10-05 ENCOUNTER — Other Ambulatory Visit: Payer: Self-pay | Admitting: *Deleted

## 2022-10-05 VITALS — BP 147/91 | HR 61 | Temp 97.5°F | Resp 18 | Ht 73.2 in | Wt 175.7 lb

## 2022-10-05 DIAGNOSIS — C8510 Unspecified B-cell lymphoma, unspecified site: Secondary | ICD-10-CM

## 2022-10-05 DIAGNOSIS — Z5111 Encounter for antineoplastic chemotherapy: Secondary | ICD-10-CM | POA: Diagnosis not present

## 2022-10-05 DIAGNOSIS — Z5112 Encounter for antineoplastic immunotherapy: Secondary | ICD-10-CM | POA: Diagnosis not present

## 2022-10-05 LAB — CMP (CANCER CENTER ONLY)
ALT: 26 U/L (ref 0–44)
AST: 44 U/L — ABNORMAL HIGH (ref 15–41)
Albumin: 3.8 g/dL (ref 3.5–5.0)
Alkaline Phosphatase: 94 U/L (ref 38–126)
Anion gap: 7 (ref 5–15)
BUN: 13 mg/dL (ref 8–23)
CO2: 25 mmol/L (ref 22–32)
Calcium: 9 mg/dL (ref 8.9–10.3)
Chloride: 107 mmol/L (ref 98–111)
Creatinine: 0.91 mg/dL (ref 0.61–1.24)
GFR, Estimated: >60 mL/min (ref >60)
Glucose, Bld: 106 mg/dL — ABNORMAL HIGH (ref 70–99)
Potassium: 3.5 mmol/L (ref 3.5–5.1)
Sodium: 139 mmol/L (ref 135–145)
Total Bilirubin: 0.4 mg/dL (ref 0.3–1.2)
Total Protein: 6.7 g/dL (ref 6.5–8.1)

## 2022-10-05 LAB — CBC WITH DIFFERENTIAL (CANCER CENTER ONLY)
Abs Immature Granulocytes: 0.12 10*3/uL — ABNORMAL HIGH (ref 0.00–0.07)
Basophils Absolute: 0.1 10*3/uL (ref 0.0–0.1)
Basophils Relative: 1 %
Eosinophils Absolute: 0 10*3/uL (ref 0.0–0.5)
Eosinophils Relative: 1 %
HCT: 36.9 % — ABNORMAL LOW (ref 39.0–52.0)
Hemoglobin: 12.9 g/dL — ABNORMAL LOW (ref 13.0–17.0)
Immature Granulocytes: 2 %
Lymphocytes Relative: 18 %
Lymphs Abs: 1.2 10*3/uL (ref 0.7–4.0)
MCH: 33.3 pg (ref 26.0–34.0)
MCHC: 35 g/dL (ref 30.0–36.0)
MCV: 95.3 fL (ref 80.0–100.0)
Monocytes Absolute: 0.7 10*3/uL (ref 0.1–1.0)
Monocytes Relative: 11 %
Neutro Abs: 4.4 10*3/uL (ref 1.7–7.7)
Neutrophils Relative %: 67 %
Platelet Count: 170 10*3/uL (ref 150–400)
RBC: 3.87 MIL/uL — ABNORMAL LOW (ref 4.22–5.81)
RDW: 15.3 % (ref 11.5–15.5)
WBC Count: 6.5 10*3/uL (ref 4.0–10.5)
nRBC: 0 % (ref 0.0–0.2)

## 2022-10-05 MED ORDER — TRIAMCINOLONE ACETONIDE 0.5 % EX OINT
1.0000 | TOPICAL_OINTMENT | Freq: Two times a day (BID) | CUTANEOUS | 1 refills | Status: DC
  Filled 2022-10-05: qty 30, 15d supply, fill #0

## 2022-10-05 MED ORDER — LONCASTUXIMAB TESIRINE-LPYL CHEMO INJECTION 10 MG
0.0750 mg/kg | Freq: Once | INTRAVENOUS | Status: AC
  Administered 2022-10-05: 6.1 mg via INTRAVENOUS
  Filled 2022-10-05: qty 1.22

## 2022-10-05 MED ORDER — SODIUM CHLORIDE 0.9% FLUSH
10.0000 mL | INTRAVENOUS | Status: DC | PRN
  Administered 2022-10-05: 10 mL
  Filled 2022-10-05: qty 10

## 2022-10-05 MED ORDER — DEXTROSE 5 % IV SOLN
Freq: Once | INTRAVENOUS | Status: AC
  Filled 2022-10-05: qty 250

## 2022-10-05 MED ORDER — SODIUM CHLORIDE 0.9 % IV SOLN
10.0000 mg | Freq: Once | INTRAVENOUS | Status: AC
  Administered 2022-10-05: 10 mg via INTRAVENOUS
  Filled 2022-10-05: qty 10

## 2022-10-05 MED ORDER — HEPARIN SOD (PORK) LOCK FLUSH 100 UNIT/ML IV SOLN
500.0000 [IU] | Freq: Once | INTRAVENOUS | Status: AC | PRN
  Administered 2022-10-05: 500 [IU]
  Filled 2022-10-05: qty 5

## 2022-10-05 NOTE — Patient Instructions (Signed)
[facility]  Discharge Instructions: Thank you for choosing [facility] to provide your oncology and hematology care.  If you have a lab appointment with the [facility], please go directly to the [facility] and check in at the registration area.  Wear comfortable clothing and clothing appropriate for easy access to any Portacath or PICC line.   We strive to give you quality time with your provider. You may need to reschedule your appointment if you arrive late (15 or more minutes).  Arriving late affects you and other patients whose appointments are after yours.  Also, if you miss three or more appointments without notifying the office, you may be dismissed from the clinic at the provider's discretion.      For prescription refill requests, have your pharmacy contact our office and allow 72 hours for refills to be completed.    Today you received the following chemotherapy and/or immunotherapy agents Zynlonta      To help prevent nausea and vomiting after your treatment, we encourage you to take your nausea medication as directed.  BELOW ARE SYMPTOMS THAT SHOULD BE REPORTED IMMEDIATELY: *FEVER GREATER THAN 100.4 F (38 C) OR HIGHER *CHILLS OR SWEATING *NAUSEA AND VOMITING THAT IS NOT CONTROLLED WITH YOUR NAUSEA MEDICATION *UNUSUAL SHORTNESS OF BREATH *UNUSUAL BRUISING OR BLEEDING *URINARY PROBLEMS (pain or burning when urinating, or frequent urination) *BOWEL PROBLEMS (unusual diarrhea, constipation, pain near the anus) TENDERNESS IN MOUTH AND THROAT WITH OR WITHOUT PRESENCE OF ULCERS (sore throat, sores in mouth, or a toothache) UNUSUAL RASH, SWELLING OR PAIN  UNUSUAL VAGINAL DISCHARGE OR ITCHING   Items with * indicate a potential emergency and should be followed up as soon as possible or go to the Emergency Department if any problems should occur.  Please show the CHEMOTHERAPY ALERT CARD or IMMUNOTHERAPY ALERT CARD at check-in to  the Emergency Department and triage nurse.  Should you have questions after your visit or need to cancel or reschedule your appointment, please contact [facility]  [phone] and follow the prompts.  Office hours are 8:00 a.m. to 4:30 p.m. Monday - Friday. Please note that voicemails left after 4:00 p.m. may not be returned until the following business day.  We are closed weekends and major holidays. You have access to a nurse at all times for urgent questions. Please call the main number to the clinic [phone] and follow the prompts.  For any non-urgent questions, you may also contact your provider using MyChart. We now offer e-Visits for anyone 47 and older to request care online for non-urgent symptoms. For details visit [email or web address].   Also download the MyChart app! Go to the app store, search "MyChart", open the app, select [facility], and log in with your MyChart username and password.

## 2022-10-05 NOTE — Progress Notes (Signed)
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  This was followed by a PET CT scan which showed extensive FDG avid adenopathy in the neck chest abdomen and pelvis.  FDG avid splenic lesions.  Solitary intramuscular FDG avid lesion in the right biceps femoris muscle.   Supraclavicular excisional lymph node biopsy showed high-grade B-cell lymphoma germinal center type Ki-67 greater than 95%.  FISH testing was positive for Bcl-2 BCL6 and MYC consistent with triple hit lymphoma.  By NCCN IPI score would be 4 based on age and elevated LDH and stage IV (intramuscular biceps femoris lesion) which puts him in the high intermediate risk group. 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   Patient received RCHOP for cycle 1 with plans for [facility] with cycle 2. IT MTX for CNS prophylaxis and he has received 3 cycles but declined further cycles.  MRI brain negative for lymphoma.  Chemotherapy was not being able to escalated upwards due to neutropenia which has persisted to 3 weeks and he has been receiving treatment every 4  weeks   PET CT scan after 3 cycles of chemotherapy showedComplete or near complete metabolic response.  Residual disease in the anterior upper right mediastinum slightly greater than background mediastinal activity   Patient found to have a clinically palpable right supraclavicular lymph node which was subsequently biopsied and was consistent with previously diagnosed lymphoma.  PET CT scan showedNew right level 5 adenopathy in the lower neck with dominant lymph node 1.6 cm Deauville 5.  No other findings of acute active lymphoma in chest abdomen pelvis and bones.  MRI brain negative for leptomeningeal disease.   Patient seen for second opinion by Dr. Aviva Kluver at [facility] for consideration of CAR-T cell therapy.  Patient did not wish to proceed with CAR-T cell treatment after reviewing risks versus benefits.  Moreover patient's health insurance will also not cover CAR-T cell therapy. Patient started on second line tafasitamab/Revlimid regimen in June 2023.  He is on Revlimid 15 mg 3 weeks on and 1 week off.  Disease progression after 3 cycles and patient was switched to [facility] regimen     Patient was noted to haveSignificant decrease in the size of supraclavicular lymph nodes consistent with treatment response Deauville 3 after 3 cycles of Pola BR chemotherapy.  Patient has only received 4 cycles of chemotherapy so far due to persistent neutropenia despite receiving growth factor support.  Repeat PET scan showed new/recurrent hypermetabolic subcutaneous tissue in the high right back/inferior neck consistent with recurrent lymphoma Deauville 5.  He received palliative radiation to that area.    Interval history-patient is tolerating Zynlonta well but he does develop folliculitis type lesions over  his forearms and sometimes the back of his neck.  He was given oral doxycycline and topical triamcinolone.  States that the triamcinolone cream has been helping him.  Otherwise denies other side effects.  Palpable  subcutaneous lesion around his right scapula has resolved  ECOG PS- 0 Pain scale- 0   Review of systems- Review of Systems  Constitutional:  Negative for chills, fever, malaise/fatigue and weight loss.  HENT:  Negative for congestion, ear discharge and nosebleeds.   Eyes:  Negative for blurred vision.  Respiratory:  Negative for cough, hemoptysis, sputum production, shortness of breath and wheezing.   Cardiovascular:  Negative for chest pain, palpitations, orthopnea and claudication.  Gastrointestinal:  Negative for abdominal pain, blood in stool, constipation, diarrhea, heartburn, melena, nausea and vomiting.  Genitourinary:  Negative for dysuria, flank pain, frequency, hematuria and urgency.  Musculoskeletal:  Negative for back pain, joint pain and myalgias.  Skin:  Positive for rash.  Neurological:  Negative for dizziness, tingling, focal weakness, seizures, weakness and headaches.  Endo/Heme/Allergies:  Does not bruise/bleed easily.  Psychiatric/Behavioral:  Negative for depression and suicidal ideas. The patient does not have insomnia.       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     Past Surgical History:  Procedure Laterality Date   BONE MARROW BIOPSY     COLONOSCOPY     EXCISION MASS NECK Right 06/12/2020   Procedure: EXCISION MASS NECK;  Surgeon: Leafy Ro, MD;  Location: [facility] ORS;  Service: General;  Laterality: Right;   HERNIA REPAIR Right    at age 67-RIH   62 CATH INSERTION     PORTACATH PLACEMENT Right 06/24/2020   Procedure: INSERTION PORT-A-CATH;  Surgeon: Leafy Ro, MD;  Location: [facility] ORS;  Service: General;  Laterality: Right;    Social History   Socioeconomic History   Marital status: Single    Spouse name: Not on file   Number of children: Not on file   Years of education: Not on file   Highest  education level: Not on file  Occupational History   Not on file  Tobacco Use   Smoking status: Former    Current packs/day: 0.00    Types: Cigarettes    Quit date: 06/13/2020    Years since quitting: 2.3   Smokeless tobacco: Never  Vaping Use   Vaping status: Never Used  Substance and Sexual Activity   Alcohol use: Not Currently   Drug use: Not Currently    Types: Marijuana   Sexual activity: Not Currently  Other Topics Concern   Not on file  Social History Narrative   Has daughter and grandson in the home   Social Determinants of Health   Financial Resource Strain: Medium Risk (07/30/2021)   Received from [facility], [facility]   Overall Financial Resource Strain (CARDIA)    Difficulty of Paying Living Expenses: Somewhat hard  Food Insecurity: No Food Insecurity (07/30/2021)   Received from [facility], [facility]   Hunger Vital Sign    Worried About Running Out of Food in the Last Year: Never true    Ran Out of Food in the Last Year: Never true  Transportation Needs: No Transportation Needs (07/30/2021)   Received from [facility], [facility]   [facility] - Transportation  Lack of Transportation (Medical): No    Lack of Transportation (Non-Medical): No  Physical Activity: Not on file  Stress: No Stress Concern Present (07/30/2021)   Received from [facility], [facility] - Occupational Stress Questionnaire    Feeling of Stress : Not at all  Social Connections: Not on file  Intimate Partner Violence: Not on file    Family History  Problem Relation Age of Onset   Anemia Mother    Hypertension Mother    Goiter Mother    Cancer Father    Cancer Sister    Multiple sclerosis Sister      Current Outpatient Medications:    allopurinol (ZYLOPRIM) 300 MG tablet, TAKE 1 TABLET BY MOUTH EVERY DAY, Disp: 30 tablet, Rfl: 1   aspirin EC 81 MG tablet, Take 1 tablet (81 mg total) by mouth daily.  Swallow whole., Disp: 30 tablet, Rfl: 0   atenolol (TENORMIN) 25 MG tablet, Take 25 mg by mouth daily., Disp: , Rfl:    cholecalciferol (VITAMIN D3) 25 MCG (1000 UNIT) tablet, Take 1,000 Units by mouth daily., Disp: , Rfl:    dexamethasone (DECADRON) 4 MG tablet, Take 4 mg tablet at  8 am and  4pm day before chemo, day 2 take 1 tablet at 4 pm only, day 3 take 1 tablet in am and 4 pm. Need to eat before taking med. Repeat the above every 3 weeks, Disp: 10 tablet, Rfl: 3   diphenhydrAMINE (BENADRYL) 2 % cream, Apply 1 Application topically 3 (three) times daily as needed for itching., Disp: , Rfl:    diphenhydrAMINE (BENADRYL) 25 mg capsule, Take 25 mg by mouth every 6 (six) hours as needed for itching or allergies., Disp: , Rfl:    doxycycline (VIBRA-TABS) 100 MG tablet, Take 1 tablet (100 mg total) by mouth 2 (two) times daily., Disp: 14 tablet, Rfl: 0   lisinopril (ZESTRIL) 10 MG tablet, Take 10 mg by mouth daily., Disp: , Rfl:    Omega-3 Fatty Acids (FISH OIL ADULT GUMMIES PO), Take by mouth., Disp: , Rfl:    ondansetron (ZOFRAN) 8 MG tablet, Take 1 tablet (8 mg total) by mouth every 8 (eight) hours as needed for nausea or vomiting., Disp: 30 tablet, Rfl: 1   prochlorperazine (COMPAZINE) 10 MG tablet, Take 1 tablet (10 mg total) by mouth every 6 (six) hours as needed for nausea or vomiting., Disp: 30 tablet, Rfl: 1   Tenofovir Alafenamide Fumarate (VEMLIDY) 25 MG TABS, Take 1 tablet (25 mg total) by mouth daily. Take with food., Disp: 30 tablet, Rfl: 2   vitamin C (ASCORBIC ACID) 500 MG tablet, Take 500 mg by mouth daily., Disp: , Rfl:    triamcinolone ointment (KENALOG) 0.5 %, Apply 1 Application topically 2 (two) times daily., Disp: 30 g, Rfl: 1 No current facility-administered medications for this visit.  Facility-Administered Medications Ordered in Other Visits:    0.9 %  sodium chloride infusion, , Intravenous, Continuous, Creig Hines, MD, Stopped at 09/09/20 1507   0.9 %  sodium chloride  infusion, , Intravenous, Continuous, Donneta Romberg, Worthy Flank, MD, Stopped at 09/11/20 1502   0.9 %  sodium chloride infusion, , Intravenous, Continuous, Creig Hines, MD, Stopped at 11/06/20 1424   heparin lock flush 100 unit/mL, 500 Units, Intravenous, Once, Creig Hines, MD   methotrexate (PF) 12 mg in sodium chloride (PF) 0.9 % INTRATHECAL chemo injection, , Intrathecal, Once, Creig Hines, MD  sodium chloride flush (NS) 0.9 % injection 10 mL, 10 mL, Intracatheter, PRN, Creig Hines, MD, 10 mL at 10/05/22 1200  Physical exam:  Vitals:   10/05/22 0851 10/05/22 0859  BP: (!) 157/92 (!) 147/91  Pulse: 64 61  Resp: 18   Temp: (!) 97.5 F (36.4 C)   TempSrc: Tympanic   SpO2: 100% 100%  Weight: 175 lb 11.2 oz (79.7 kg)   Height: 6' 1.2" (1.859 m)    Physical Exam Cardiovascular:     Rate and Rhythm: Normal rate and regular rhythm.     Heart sounds: Normal heart sounds.  Pulmonary:     Effort: Pulmonary effort is normal.     Breath sounds: Normal breath sounds.  Abdominal:     General: Bowel sounds are normal.     Palpations: Abdomen is soft.  Skin:    General: Skin is warm and dry.  Neurological:     Mental Status: He is alert and oriented to person, place, and time.   There is no palpable lymphoma around the right scapula.  No palpable right cervical adenopathy     Latest Ref Rng & Units 10/05/2022    8:38 AM  CMP  Glucose 70 - 99 mg/dL 960   BUN 8 - 23 mg/dL 13   Creatinine 4.54 - 1.24 mg/dL 0.98   Sodium 119 - 147 mmol/L 139   Potassium 3.5 - 5.1 mmol/L 3.5   Chloride 98 - 111 mmol/L 107   CO2 22 - 32 mmol/L 25   Calcium 8.9 - 10.3 mg/dL 9.0   Total Protein 6.5 - 8.1 g/dL 6.7   Total Bilirubin 0.3 - 1.2 mg/dL 0.4   Alkaline Phos 38 - 126 U/L 94   AST 15 - 41 U/L 44   ALT 0 - 44 U/L 26       Latest Ref Rng & Units 10/05/2022    8:38 AM  CBC  WBC 4.0 - 10.5 K/uL 6.5   Hemoglobin 13.0 - 17.0 g/dL 82.9   Hematocrit 56.2 - 52.0 % 36.9   Platelets 150 - 400  K/uL 170      Assessment and plan- Patient is a 62 y.o. 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  He is here for on treatment assessment prior to cycle 3 of Zynlonta  Counts okay to proceed with cycle 3 of Zynlonta today.  I will see him back in 3 weeks for cycle 4.  We will plan to get PET CT scan in about 4 to 5 weeks time.  Clinically has responded well and the subcutaneous lymphoma lesion around his right scapula is no longer palpable.  I am inclined towards at least giving him 6 months of treatment before considering a treatment break.  Skin rash: Likely secondary to Zynlonta.  Patient states that topical triamcinolone cream has been helping him and he will continue with that.  We will restart doxycycline if topicals do not help   Visit Diagnosis 1. High grade B-cell lymphoma (HCC)   2. Encounter for antineoplastic chemotherapy      Dr. Owens Shark, MD, MPH [facility] at [facility] [phone] 10/05/2022 12:58 PM

## 2022-10-06 ENCOUNTER — Other Ambulatory Visit: Payer: Self-pay

## 2022-10-07 ENCOUNTER — Inpatient Hospital Stay: Payer: Medicaid Other

## 2022-10-07 DIAGNOSIS — Z5112 Encounter for antineoplastic immunotherapy: Secondary | ICD-10-CM | POA: Diagnosis not present

## 2022-10-07 DIAGNOSIS — C8510 Unspecified B-cell lymphoma, unspecified site: Secondary | ICD-10-CM

## 2022-10-07 MED ORDER — PEGFILGRASTIM-CBQV 6 MG/0.6ML ~~LOC~~ SOSY
6.0000 mg | PREFILLED_SYRINGE | Freq: Once | SUBCUTANEOUS | Status: AC
  Administered 2022-10-07: 6 mg via SUBCUTANEOUS
  Filled 2022-10-07: qty 0.6

## 2022-10-11 ENCOUNTER — Other Ambulatory Visit: Payer: Self-pay

## 2022-10-13 ENCOUNTER — Ambulatory Visit
Admission: RE | Admit: 2022-10-13 | Discharge: 2022-10-13 | Disposition: A | Discharge status: Home / Self Care | Payer: Medicaid Other | Source: Ambulatory Visit | Attending: Oncology | Admitting: Oncology

## 2022-10-13 DIAGNOSIS — I700 Atherosclerosis of aorta: Secondary | ICD-10-CM | POA: Diagnosis not present

## 2022-10-13 DIAGNOSIS — R918 Other nonspecific abnormal finding of lung field: Secondary | ICD-10-CM | POA: Diagnosis not present

## 2022-10-13 DIAGNOSIS — C8510 Unspecified B-cell lymphoma, unspecified site: Secondary | ICD-10-CM | POA: Diagnosis present

## 2022-10-13 LAB — GLUCOSE, CAPILLARY: Glucose-Capillary: 91 mg/dL (ref 70–99)

## 2022-10-13 MED ORDER — FLUDEOXYGLUCOSE F - 18 (FDG) INJECTION
9.1000 | Freq: Once | INTRAVENOUS | Status: AC | PRN
  Administered 2022-10-13: 9.78 via INTRAVENOUS

## 2022-10-18 ENCOUNTER — Other Ambulatory Visit (HOSPITAL_COMMUNITY): Payer: Self-pay

## 2022-10-19 ENCOUNTER — Encounter: Payer: Self-pay | Admitting: Oncology

## 2022-10-25 MED FILL — Dexamethasone Sodium Phosphate Inj 100 MG/10ML: INTRAMUSCULAR | Qty: 1 | Status: AC

## 2022-10-26 ENCOUNTER — Encounter: Payer: Self-pay | Admitting: Oncology

## 2022-10-26 ENCOUNTER — Inpatient Hospital Stay (HOSPITAL_BASED_OUTPATIENT_CLINIC_OR_DEPARTMENT_OTHER): Payer: Medicaid Other | Admitting: Oncology

## 2022-10-26 ENCOUNTER — Inpatient Hospital Stay: Payer: Medicaid Other

## 2022-10-26 ENCOUNTER — Inpatient Hospital Stay: Payer: Medicaid Other | Attending: Oncology

## 2022-10-26 VITALS — BP 133/81 | HR 67 | Temp 97.5°F | Resp 18 | Ht 73.2 in | Wt 174.2 lb

## 2022-10-26 DIAGNOSIS — Z7961 Long term (current) use of immunomodulator: Secondary | ICD-10-CM | POA: Diagnosis not present

## 2022-10-26 DIAGNOSIS — Z5986 Financial insecurity: Secondary | ICD-10-CM | POA: Diagnosis not present

## 2022-10-26 DIAGNOSIS — C8510 Unspecified B-cell lymphoma, unspecified site: Secondary | ICD-10-CM

## 2022-10-26 DIAGNOSIS — Z5111 Encounter for antineoplastic chemotherapy: Secondary | ICD-10-CM | POA: Diagnosis not present

## 2022-10-26 DIAGNOSIS — C8331 Diffuse large B-cell lymphoma, lymph nodes of head, face, and neck: Secondary | ICD-10-CM | POA: Diagnosis present

## 2022-10-26 DIAGNOSIS — Z79899 Other long term (current) drug therapy: Secondary | ICD-10-CM | POA: Insufficient documentation

## 2022-10-26 DIAGNOSIS — Z5112 Encounter for antineoplastic immunotherapy: Secondary | ICD-10-CM | POA: Insufficient documentation

## 2022-10-26 DIAGNOSIS — R21 Rash and other nonspecific skin eruption: Secondary | ICD-10-CM | POA: Diagnosis not present

## 2022-10-26 DIAGNOSIS — Z8249 Family history of ischemic heart disease and other diseases of the circulatory system: Secondary | ICD-10-CM | POA: Insufficient documentation

## 2022-10-26 DIAGNOSIS — Z8269 Family history of other diseases of the musculoskeletal system and connective tissue: Secondary | ICD-10-CM | POA: Diagnosis not present

## 2022-10-26 DIAGNOSIS — Z87891 Personal history of nicotine dependence: Secondary | ICD-10-CM | POA: Insufficient documentation

## 2022-10-26 DIAGNOSIS — Z832 Family history of diseases of the blood and blood-forming organs and certain disorders involving the immune mechanism: Secondary | ICD-10-CM | POA: Insufficient documentation

## 2022-10-26 DIAGNOSIS — I700 Atherosclerosis of aorta: Secondary | ICD-10-CM | POA: Insufficient documentation

## 2022-10-26 DIAGNOSIS — Z8349 Family history of other endocrine, nutritional and metabolic diseases: Secondary | ICD-10-CM | POA: Diagnosis not present

## 2022-10-26 DIAGNOSIS — Z809 Family history of malignant neoplasm, unspecified: Secondary | ICD-10-CM | POA: Insufficient documentation

## 2022-10-26 DIAGNOSIS — L739 Follicular disorder, unspecified: Secondary | ICD-10-CM | POA: Insufficient documentation

## 2022-10-26 LAB — CMP (CANCER CENTER ONLY)
ALT: 19 U/L (ref 0–44)
AST: 48 U/L — ABNORMAL HIGH (ref 15–41)
Albumin: 3.9 g/dL (ref 3.5–5.0)
Alkaline Phosphatase: 102 U/L (ref 38–126)
Anion gap: 9 (ref 5–15)
BUN: 14 mg/dL (ref 8–23)
CO2: 23 mmol/L (ref 22–32)
Calcium: 9.3 mg/dL (ref 8.9–10.3)
Chloride: 105 mmol/L (ref 98–111)
Creatinine: 0.87 mg/dL (ref 0.61–1.24)
GFR, Estimated: >60 mL/min (ref >60)
Glucose, Bld: 95 mg/dL (ref 70–99)
Potassium: 3.6 mmol/L (ref 3.5–5.1)
Sodium: 137 mmol/L (ref 135–145)
Total Bilirubin: 0.6 mg/dL (ref 0.3–1.2)
Total Protein: 7 g/dL (ref 6.5–8.1)

## 2022-10-26 LAB — CBC WITH DIFFERENTIAL (CANCER CENTER ONLY)
Abs Immature Granulocytes: 0.42 10*3/uL — ABNORMAL HIGH (ref 0.00–0.07)
Basophils Absolute: 0.1 10*3/uL (ref 0.0–0.1)
Basophils Relative: 1 %
Eosinophils Absolute: 0 10*3/uL (ref 0.0–0.5)
Eosinophils Relative: 0 %
HCT: 37.5 % — ABNORMAL LOW (ref 39.0–52.0)
Hemoglobin: 12.7 g/dL — ABNORMAL LOW (ref 13.0–17.0)
Immature Granulocytes: 6 %
Lymphocytes Relative: 14 %
Lymphs Abs: 0.9 10*3/uL (ref 0.7–4.0)
MCH: 32.7 pg (ref 26.0–34.0)
MCHC: 33.9 g/dL (ref 30.0–36.0)
MCV: 96.6 fL (ref 80.0–100.0)
Monocytes Absolute: 0.6 10*3/uL (ref 0.1–1.0)
Monocytes Relative: 9 %
Neutro Abs: 4.8 10*3/uL (ref 1.7–7.7)
Neutrophils Relative %: 70 %
Platelet Count: 214 10*3/uL (ref 150–400)
RBC: 3.88 MIL/uL — ABNORMAL LOW (ref 4.22–5.81)
RDW: 14.7 % (ref 11.5–15.5)
Smear Review: NORMAL
WBC Count: 6.8 10*3/uL (ref 4.0–10.5)
nRBC: 0 % (ref 0.0–0.2)

## 2022-10-26 MED ORDER — SODIUM CHLORIDE 0.9 % IV SOLN
10.0000 mg | Freq: Once | INTRAVENOUS | Status: AC
  Administered 2022-10-26: 10 mg via INTRAVENOUS
  Filled 2022-10-26: qty 10

## 2022-10-26 MED ORDER — HEPARIN SOD (PORK) LOCK FLUSH 100 UNIT/ML IV SOLN
500.0000 [IU] | Freq: Once | INTRAVENOUS | Status: AC | PRN
  Administered 2022-10-26: 500 [IU]
  Filled 2022-10-26: qty 5

## 2022-10-26 MED ORDER — SODIUM CHLORIDE 0.9% FLUSH
10.0000 mL | INTRAVENOUS | Status: DC | PRN
  Filled 2022-10-26: qty 10

## 2022-10-26 MED ORDER — LONCASTUXIMAB TESIRINE-LPYL CHEMO INJECTION 10 MG
0.0750 mg/kg | Freq: Once | INTRAVENOUS | Status: AC
  Administered 2022-10-26: 6.1 mg via INTRAVENOUS
  Filled 2022-10-26: qty 1.22

## 2022-10-26 MED ORDER — DEXTROSE 5 % IV SOLN
Freq: Once | INTRAVENOUS | Status: AC
  Filled 2022-10-26: qty 250

## 2022-10-26 NOTE — Patient Instructions (Signed)
[facility]  Discharge Instructions: Thank you for choosing [facility] to provide your oncology and hematology care.  If you have a lab appointment with the [facility], please go directly to the [facility] and check in at the registration area.  Wear comfortable clothing and clothing appropriate for easy access to any Portacath or PICC line.   We strive to give you quality time with your provider. You may need to reschedule your appointment if you arrive late (15 or more minutes).  Arriving late affects you and other patients whose appointments are after yours.  Also, if you miss three or more appointments without notifying the office, you may be dismissed from the clinic at the provider's discretion.      For prescription refill requests, have your pharmacy contact our office and allow 72 hours for refills to be completed.    Today you received the following chemotherapy and/or immunotherapy agents Zynlonta      To help prevent nausea and vomiting after your treatment, we encourage you to take your nausea medication as directed.  BELOW ARE SYMPTOMS THAT SHOULD BE REPORTED IMMEDIATELY: *FEVER GREATER THAN 100.4 F (38 C) OR HIGHER *CHILLS OR SWEATING *NAUSEA AND VOMITING THAT IS NOT CONTROLLED WITH YOUR NAUSEA MEDICATION *UNUSUAL SHORTNESS OF BREATH *UNUSUAL BRUISING OR BLEEDING *URINARY PROBLEMS (pain or burning when urinating, or frequent urination) *BOWEL PROBLEMS (unusual diarrhea, constipation, pain near the anus) TENDERNESS IN MOUTH AND THROAT WITH OR WITHOUT PRESENCE OF ULCERS (sore throat, sores in mouth, or a toothache) UNUSUAL RASH, SWELLING OR PAIN  UNUSUAL VAGINAL DISCHARGE OR ITCHING   Items with * indicate a potential emergency and should be followed up as soon as possible or go to the Emergency Department if any problems should occur.  Please show the CHEMOTHERAPY ALERT CARD or IMMUNOTHERAPY ALERT CARD at check-in to  the Emergency Department and triage nurse.  Should you have questions after your visit or need to cancel or reschedule your appointment, please contact [facility]  [phone] and follow the prompts.  Office hours are 8:00 a.m. to 4:30 p.m. Monday - Friday. Please note that voicemails left after 4:00 p.m. may not be returned until the following business day.  We are closed weekends and major holidays. You have access to a nurse at all times for urgent questions. Please call the main number to the clinic [phone] and follow the prompts.  For any non-urgent questions, you may also contact your provider using MyChart. We now offer e-Visits for anyone 47 and older to request care online for non-urgent symptoms. For details visit [email or web address].   Also download the MyChart app! Go to the app store, search "MyChart", open the app, select [facility], and log in with your MyChart username and password.

## 2022-10-26 NOTE — Progress Notes (Signed)
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  This was followed by a PET CT scan which showed extensive FDG avid adenopathy in the neck chest abdomen and pelvis.  FDG avid splenic lesions.  Solitary intramuscular FDG avid lesion in the right biceps femoris muscle.   Supraclavicular excisional lymph node biopsy showed high-grade B-cell lymphoma germinal center type Ki-67 greater than 95%.  FISH testing was positive for Bcl-2 BCL6 and MYC consistent with triple hit lymphoma.  By NCCN IPI score would be 4 based on age and elevated LDH and stage IV (intramuscular biceps femoris lesion) which puts him in the high intermediate risk group. 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   Patient received RCHOP for cycle 1 with plans for DA [facility] with cycle 2. IT MTX for CNS prophylaxis and he has received 3 cycles but declined further cycles.  MRI brain negative for lymphoma.  Chemotherapy was not being able to escalated upwards due to neutropenia which has persisted to 3 weeks and he has been receiving treatment every 4  weeks   PET CT scan after 3 cycles of chemotherapy showedComplete or near complete metabolic response.  Residual disease in the anterior upper right mediastinum slightly greater than background mediastinal activity   Patient found to have a clinically palpable right supraclavicular lymph node which was subsequently biopsied and was consistent with previously diagnosed lymphoma.  PET CT scan showedNew right level 5 adenopathy in the lower neck with dominant lymph node 1.6 cm Deauville 5.  No other findings of acute active lymphoma in chest abdomen pelvis and bones.  MRI brain negative for leptomeningeal disease.   Patient seen for second opinion by Dr. Aviva Kluver at [facility] for consideration of CAR-T cell therapy.  Patient did not wish to proceed with CAR-T cell treatment after reviewing risks versus benefits.  Moreover patient's health insurance will also not cover CAR-T cell therapy. Patient started on second line tafasitamab/Revlimid regimen in June 2023.  He is on Revlimid 15 mg 3 weeks on and 1 week off.  Disease progression after 3 cycles and patient was switched to [facility] regimen     Patient was noted to haveSignificant decrease in the size of supraclavicular lymph nodes consistent with treatment response Deauville 3 after 3 cycles of Pola BR chemotherapy.  Patient has only received 4 cycles of chemotherapy so far due to persistent neutropenia despite receiving growth factor support.  Repeat PET scan showed new/recurrent hypermetabolic subcutaneous tissue in the high right back/inferior neck consistent with recurrent lymphoma Deauville 5.  He received palliative radiation to that area.      Interval history-tolerating treatments well.  He does get folliculitis type of rash  especially over his bilateral forearms which are exposed to sun.  These are self-limited.  Denies other complaints at this time  ECOG PS- 0 Pain scale- 0   Review of systems- Review of Systems  Constitutional:  Negative for  chills, fever, malaise/fatigue and weight loss.  HENT:  Negative for congestion, ear discharge and nosebleeds.   Eyes:  Negative for blurred vision.  Respiratory:  Negative for cough, hemoptysis, sputum production, shortness of breath and wheezing.   Cardiovascular:  Negative for chest pain, palpitations, orthopnea and claudication.  Gastrointestinal:  Negative for abdominal pain, blood in stool, constipation, diarrhea, heartburn, melena, nausea and vomiting.  Genitourinary:  Negative for dysuria, flank pain, frequency, hematuria and urgency.  Musculoskeletal:  Negative for back pain, joint pain and myalgias.  Skin:  Negative for rash.  Neurological:  Negative for dizziness, tingling, focal weakness, seizures, weakness and headaches.  Endo/Heme/Allergies:  Does not bruise/bleed easily.  Psychiatric/Behavioral:  Negative for depression and suicidal ideas. The patient does not have insomnia.       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     Past Surgical History:  Procedure Laterality Date   BONE MARROW BIOPSY     COLONOSCOPY     EXCISION MASS NECK Right 06/12/2020   Procedure: EXCISION MASS NECK;  Surgeon: Leafy Ro, MD;  Location: [facility] ORS;  Service: General;  Laterality: Right;   HERNIA REPAIR Right    at age 50-RIH   32 CATH INSERTION     PORTACATH PLACEMENT Right 06/24/2020   Procedure: INSERTION PORT-A-CATH;  Surgeon: Leafy Ro, MD;  Location: [facility] ORS;  Service: General;  Laterality: Right;    Social History   Socioeconomic History   Marital status: Single    Spouse name: Not on file   Number of children: Not on file   Years of education: Not on file   Highest education level: Not on file  Occupational History   Not on file  Tobacco Use   Smoking status: Former    Current packs/day: 0.00    Types: Cigarettes     Quit date: 06/13/2020    Years since quitting: 2.3   Smokeless tobacco: Never  Vaping Use   Vaping status: Never Used  Substance and Sexual Activity   Alcohol use: Not Currently   Drug use: Not Currently    Types: Marijuana   Sexual activity: Not Currently  Other Topics Concern   Not on file  Social History Narrative   Has daughter and grandson in the home   Social Determinants of Health   Financial Resource Strain: Medium Risk (07/30/2021)   Received [facility], [facility]   Overall Financial Resource Strain (CARDIA)    Difficulty of Paying Living Expenses: Somewhat hard  Food Insecurity: No Food Insecurity (07/30/2021)   Received from [facility], [facility]   Hunger Vital Sign    Worried About Running Out of Food in the Last Year: Never true    Ran Out of Food in the Last Year: Never true  Transportation Needs: No Transportation Needs (07/30/2021)   Received from [facility], [facility]   [facility] - Transportation    Lack of Transportation (Medical): No    Lack of Transportation (Non-Medical): No  Physical Activity: Not on file  Stress:  No Stress Concern Present (07/30/2021)   Received from [facility], [facility]   [facility] of Occupational Health - Occupational Stress Questionnaire    Feeling of Stress : Not at all  Social Connections: Not on file  Intimate Partner Violence: Not on file    Family History  Problem Relation Age of Onset   Anemia Mother    Hypertension Mother    Goiter Mother    Cancer Father    Cancer Sister    Multiple sclerosis Sister      Current Outpatient Medications:    allopurinol (ZYLOPRIM) 300 MG tablet, TAKE 1 TABLET BY MOUTH EVERY DAY, Disp: 30 tablet, Rfl: 1   aspirin EC 81 MG tablet, Take 1 tablet (81 mg total) by mouth daily. Swallow whole., Disp: 30 tablet, Rfl: 0   atenolol (TENORMIN) 25 MG tablet, Take 25 mg by mouth daily., Disp: , Rfl:    cholecalciferol (VITAMIN D3) 25 MCG (1000  UNIT) tablet, Take 1,000 Units by mouth daily., Disp: , Rfl:    dexamethasone (DECADRON) 4 MG tablet, Take 4 mg tablet at  8 am and  4pm day before chemo, day 2 take 1 tablet at 4 pm only, day 3 take 1 tablet in am and 4 pm. Need to eat before taking med. Repeat the above every 3 weeks, Disp: 10 tablet, Rfl: 3   diphenhydrAMINE (BENADRYL) 2 % cream, Apply 1 Application topically 3 (three) times daily as needed for itching., Disp: , Rfl:    diphenhydrAMINE (BENADRYL) 25 mg capsule, Take 25 mg by mouth every 6 (six) hours as needed for itching or allergies., Disp: , Rfl:    doxycycline (VIBRA-TABS) 100 MG tablet, Take 1 tablet (100 mg total) by mouth 2 (two) times daily., Disp: 14 tablet, Rfl: 0   lisinopril (ZESTRIL) 10 MG tablet, Take 10 mg by mouth daily., Disp: , Rfl:    Omega-3 Fatty Acids (FISH OIL ADULT GUMMIES PO), Take by mouth., Disp: , Rfl:    ondansetron (ZOFRAN) 8 MG tablet, Take 1 tablet (8 mg total) by mouth every 8 (eight) hours as needed for nausea or vomiting., Disp: 30 tablet, Rfl: 1   prochlorperazine (COMPAZINE) 10 MG tablet, Take 1 tablet (10 mg total) by mouth every 6 (six) hours as needed for nausea or vomiting., Disp: 30 tablet, Rfl: 1   Tenofovir Alafenamide Fumarate (VEMLIDY) 25 MG TABS, Take 1 tablet (25 mg total) by mouth daily. Take with food., Disp: 30 tablet, Rfl: 2   triamcinolone ointment (KENALOG) 0.5 %, Apply 1 Application topically 2 (two) times daily., Disp: 30 g, Rfl: 1   vitamin C (ASCORBIC ACID) 500 MG tablet, Take 500 mg by mouth daily., Disp: , Rfl:  No current facility-administered medications for this visit.  Facility-Administered Medications Ordered in Other Visits:    0.9 %  sodium chloride infusion, , Intravenous, Continuous, Creig Hines, MD, Stopped at 09/09/20 1507   0.9 %  sodium chloride infusion, , Intravenous, Continuous, Donneta Romberg, Worthy Flank, MD, Stopped at 09/11/20 1502   0.9 %  sodium chloride infusion, , Intravenous, Continuous, Creig Hines, MD, Stopped at 11/06/20 1424   heparin lock flush 100 unit/mL, 500 Units, Intravenous, Once, Creig Hines, MD   methotrexate (PF) 12 mg in sodium chloride (PF) 0.9 % INTRATHECAL chemo injection, , Intrathecal, Once, Creig Hines, MD  Physical exam:  Vitals:   10/26/22 0839  BP: 133/81  Pulse: 67  Resp: 18  Temp: (!) 97.5  F (36.4 C)  TempSrc: Tympanic  SpO2: 100%  Weight: 174 lb 3.2 oz (79 kg)  Height: 6' 1.2" (1.859 m)   Physical Exam Cardiovascular:     Rate and Rhythm: Normal rate and regular rhythm.     Heart sounds: Normal heart sounds.  Pulmonary:     Effort: Pulmonary effort is normal.     Breath sounds: Normal breath sounds.  Abdominal:     General: Bowel sounds are normal.     Palpations: Abdomen is soft.  Skin:    General: Skin is warm and dry.  Neurological:     Mental Status: He is alert and oriented to person, place, and time.         Latest Ref Rng & Units 10/26/2022    8:30 AM  CMP  Glucose 70 - 99 mg/dL 95   BUN 8 - 23 mg/dL 14   Creatinine 4.54 - 1.24 mg/dL 0.98   Sodium 119 - 147 mmol/L 137   Potassium 3.5 - 5.1 mmol/L 3.6   Chloride 98 - 111 mmol/L 105   CO2 22 - 32 mmol/L 23   Calcium 8.9 - 10.3 mg/dL 9.3   Total Protein 6.5 - 8.1 g/dL 7.0   Total Bilirubin 0.3 - 1.2 mg/dL 0.6   Alkaline Phos 38 - 126 U/L 102   AST 15 - 41 U/L 48   ALT 0 - 44 U/L 19       Latest Ref Rng & Units 10/26/2022    8:30 AM  CBC  WBC 4.0 - 10.5 K/uL 6.8   Hemoglobin 13.0 - 17.0 g/dL 82.9   Hematocrit 56.2 - 52.0 % 37.5   Platelets 150 - 400 K/uL 214     No images are attached to the encounter.  NM PET Image Restag (PS) Skull Base To Thigh  Result Date: 10/19/2022 CLINICAL DATA:  Subsequent treatment strategy for high-grade B-cell lymphoma. EXAM: NUCLEAR MEDICINE PET SKULL BASE TO THIGH TECHNIQUE: 9.8 mCi F-18 FDG was injected intravenously. Full-ring PET imaging was performed from the skull base to thigh after the radiotracer. CT data was  obtained and used for attenuation correction and anatomic localization. Fasting blood glucose: 91 mg/dl COMPARISON:  13/10/6576 PET-CT. FINDINGS: Mediastinal blood pool activity: SUV max 2.2 Liver activity: SUV max 3.1 NECK: No enlarged or hypermetabolic lymph nodes in the neck. Incidental CT findings: Right internal jugular Port-A-Cath terminates in the upper third of the SVC at the junction with the brachiocephalic veins, unchanged. CHEST: The 3 previously described hypermetabolic subcutaneous lesions in the posterolateral right shoulder have substantially decreased in size and metabolism. The anterior-most right shoulder superficial lesion measures 0.7 cm with max SUV 2.8 (series 4/image 39), previously 1.1 cm with max SUV 6.6. The middle superficial right shoulder lesion measures 0.7 cm with max SUV 0.9 (series 4/image 42), previously 2.3 cm with max SUV 8.8. The posterior-most superficial right shoulder lesion measures 1.0 cm with max SUV 1.5 (series 4/image 45), previously 3.0 cm with max SUV 11.7. No new hypermetabolic focal superficial lesions in the chest. No enlarged or hypermetabolic axillary, mediastinal or hilar lymph nodes. No hypermetabolic pulmonary nodules. Faint nonfocal FDG uptake associated with largely new patchy centrilobular micronodularity in the posterior right upper lobe. Incidental CT findings: Atherosclerotic nonaneurysmal thoracic aorta. ABDOMEN/PELVIS: No abnormal hypermetabolic activity within the liver, pancreas, adrenal glands, or spleen. No hypermetabolic lymph nodes in the abdomen or pelvis. Incidental CT findings: Atherosclerotic nonaneurysmal abdominal aorta. SKELETON: Intense FDG uptake throughout the axial and  visualized proximal appendicular spine most compatible with treatment related reactive marrow state. No focal hypermetabolic activity to suggest skeletal metastasis. Incidental CT findings: None. IMPRESSION: 1. Significant interval positive response to therapy. The 3  previously described hypermetabolic subcutaneous lesions in the posterolateral right shoulder have substantially decreased in size and metabolism. Deauville score 3. No new sites of hypermetabolic disease. 2. Faint nonfocal FDG uptake associated with largely new patchy centrilobular micronodularity in the posterior right upper lobe, most compatible with a nonspecific infectious or inflammatory bronchiolitis. Recommend attention on future follow-up chest CT or PET-CT studies. 3. Intense FDG uptake throughout the axial and visualized proximal appendicular spine, most compatible with treatment related reactive marrow state. No focal hypermetabolic osseous lesions to suggest skeletal metastasis. 4.  Aortic Atherosclerosis (ICD10-I70.0). Electronically Signed   By: Delbert Phenix M.D.   On: 10/19/2022 17:02     Assessment and plan- Patient is a 62 y.o. 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  He is here for on treatment assessment prior to cycle 4 of Zynlonta  I have reviewed PET CT scan images independently and discussedFindings with the patient which shows positive response to treatment so far and decrease in the size of subcutaneous lesions over the posterior lateral right shoulder.  Even clinically these lesions are no longer palpable.  They are now subcentimeters in size and Deauville score of 3.  My plan is to continue Zynlonta at least until mid December before considering treatment breaks as long as he does not have any toxicity.  Typically Wynelle Link is given until progression or toxicity  Counts okay to proceed with cycle 4 of Zynlonta today and I will see him back in 3 weeks for cycle 5.  He does get growth factor support with each cycle due to his history of benign ethnic neutropenia   Visit Diagnosis 1. Encounter for antineoplastic chemotherapy   2. High grade B-cell lymphoma (HCC)      Dr. Owens Shark, MD, MPH [facility] [phone] 10/26/2022 9:05 AM

## 2022-10-28 ENCOUNTER — Other Ambulatory Visit: Payer: Self-pay

## 2022-10-28 ENCOUNTER — Inpatient Hospital Stay: Payer: Medicaid Other

## 2022-10-28 DIAGNOSIS — C8510 Unspecified B-cell lymphoma, unspecified site: Secondary | ICD-10-CM

## 2022-10-28 DIAGNOSIS — Z5112 Encounter for antineoplastic immunotherapy: Secondary | ICD-10-CM | POA: Diagnosis not present

## 2022-10-28 MED ORDER — PEGFILGRASTIM-CBQV 6 MG/0.6ML ~~LOC~~ SOSY
6.0000 mg | PREFILLED_SYRINGE | Freq: Once | SUBCUTANEOUS | Status: AC
  Administered 2022-10-28: 6 mg via SUBCUTANEOUS
  Filled 2022-10-28: qty 0.6

## 2022-11-04 ENCOUNTER — Other Ambulatory Visit: Payer: Self-pay

## 2022-11-05 ENCOUNTER — Other Ambulatory Visit: Payer: Self-pay

## 2022-11-08 ENCOUNTER — Other Ambulatory Visit (HOSPITAL_COMMUNITY): Payer: Self-pay

## 2022-11-08 ENCOUNTER — Encounter (HOSPITAL_COMMUNITY): Payer: Self-pay

## 2022-11-09 ENCOUNTER — Encounter: Payer: Self-pay | Admitting: Oncology

## 2022-11-11 ENCOUNTER — Other Ambulatory Visit (HOSPITAL_COMMUNITY): Payer: Self-pay

## 2022-11-12 ENCOUNTER — Other Ambulatory Visit: Payer: Self-pay | Admitting: Pharmacist

## 2022-11-12 ENCOUNTER — Other Ambulatory Visit (HOSPITAL_COMMUNITY): Payer: Self-pay

## 2022-11-12 DIAGNOSIS — R768 Other specified abnormal immunological findings in serum: Secondary | ICD-10-CM

## 2022-11-14 MED ORDER — VEMLIDY 25 MG PO TABS
25.0000 mg | ORAL_TABLET | Freq: Every day | ORAL | 2 refills | Status: DC
  Filled 2022-11-14: qty 30, 30d supply, fill #0

## 2022-11-16 ENCOUNTER — Telehealth: Payer: Self-pay

## 2022-11-16 ENCOUNTER — Encounter: Payer: Self-pay | Admitting: Oncology

## 2022-11-16 ENCOUNTER — Other Ambulatory Visit: Payer: Self-pay

## 2022-11-16 ENCOUNTER — Inpatient Hospital Stay (HOSPITAL_BASED_OUTPATIENT_CLINIC_OR_DEPARTMENT_OTHER): Payer: Medicaid Other | Admitting: Oncology

## 2022-11-16 ENCOUNTER — Inpatient Hospital Stay: Payer: Medicaid Other | Attending: Oncology

## 2022-11-16 ENCOUNTER — Inpatient Hospital Stay: Payer: Medicaid Other

## 2022-11-16 ENCOUNTER — Telehealth: Payer: Self-pay | Admitting: *Deleted

## 2022-11-16 ENCOUNTER — Other Ambulatory Visit (HOSPITAL_COMMUNITY): Payer: Self-pay

## 2022-11-16 VITALS — BP 136/91 | HR 64 | Temp 95.6°F | Resp 18 | Ht 73.2 in | Wt 171.2 lb

## 2022-11-16 DIAGNOSIS — Z79624 Long term (current) use of inhibitors of nucleotide synthesis: Secondary | ICD-10-CM | POA: Diagnosis not present

## 2022-11-16 DIAGNOSIS — C8331 Diffuse large B-cell lymphoma, lymph nodes of head, face, and neck: Secondary | ICD-10-CM | POA: Diagnosis present

## 2022-11-16 DIAGNOSIS — Z7961 Long term (current) use of immunomodulator: Secondary | ICD-10-CM | POA: Insufficient documentation

## 2022-11-16 DIAGNOSIS — Z8249 Family history of ischemic heart disease and other diseases of the circulatory system: Secondary | ICD-10-CM | POA: Diagnosis not present

## 2022-11-16 DIAGNOSIS — L270 Generalized skin eruption due to drugs and medicaments taken internally: Secondary | ICD-10-CM | POA: Insufficient documentation

## 2022-11-16 DIAGNOSIS — Z5112 Encounter for antineoplastic immunotherapy: Secondary | ICD-10-CM | POA: Insufficient documentation

## 2022-11-16 DIAGNOSIS — Z79899 Other long term (current) drug therapy: Secondary | ICD-10-CM | POA: Insufficient documentation

## 2022-11-16 DIAGNOSIS — Z5986 Financial insecurity: Secondary | ICD-10-CM | POA: Insufficient documentation

## 2022-11-16 DIAGNOSIS — Z809 Family history of malignant neoplasm, unspecified: Secondary | ICD-10-CM | POA: Insufficient documentation

## 2022-11-16 DIAGNOSIS — Z5111 Encounter for antineoplastic chemotherapy: Secondary | ICD-10-CM | POA: Diagnosis not present

## 2022-11-16 DIAGNOSIS — C8510 Unspecified B-cell lymphoma, unspecified site: Secondary | ICD-10-CM

## 2022-11-16 DIAGNOSIS — Z8269 Family history of other diseases of the musculoskeletal system and connective tissue: Secondary | ICD-10-CM | POA: Insufficient documentation

## 2022-11-16 DIAGNOSIS — Z87891 Personal history of nicotine dependence: Secondary | ICD-10-CM | POA: Insufficient documentation

## 2022-11-16 DIAGNOSIS — Z5189 Encounter for other specified aftercare: Secondary | ICD-10-CM | POA: Diagnosis not present

## 2022-11-16 DIAGNOSIS — Z832 Family history of diseases of the blood and blood-forming organs and certain disorders involving the immune mechanism: Secondary | ICD-10-CM | POA: Diagnosis not present

## 2022-11-16 DIAGNOSIS — R21 Rash and other nonspecific skin eruption: Secondary | ICD-10-CM | POA: Diagnosis not present

## 2022-11-16 DIAGNOSIS — Z8349 Family history of other endocrine, nutritional and metabolic diseases: Secondary | ICD-10-CM | POA: Insufficient documentation

## 2022-11-16 DIAGNOSIS — E876 Hypokalemia: Secondary | ICD-10-CM | POA: Diagnosis not present

## 2022-11-16 DIAGNOSIS — Z79631 Long term (current) use of antimetabolite agent: Secondary | ICD-10-CM | POA: Diagnosis not present

## 2022-11-16 DIAGNOSIS — T451X5A Adverse effect of antineoplastic and immunosuppressive drugs, initial encounter: Secondary | ICD-10-CM | POA: Diagnosis not present

## 2022-11-16 DIAGNOSIS — L739 Follicular disorder, unspecified: Secondary | ICD-10-CM | POA: Insufficient documentation

## 2022-11-16 LAB — CBC WITH DIFFERENTIAL (CANCER CENTER ONLY)
Abs Immature Granulocytes: 0.09 10*3/uL — ABNORMAL HIGH (ref 0.00–0.07)
Basophils Absolute: 0 10*3/uL (ref 0.0–0.1)
Basophils Relative: 1 %
Eosinophils Absolute: 0 10*3/uL (ref 0.0–0.5)
Eosinophils Relative: 1 %
HCT: 37.4 % — ABNORMAL LOW (ref 39.0–52.0)
Hemoglobin: 12.8 g/dL — ABNORMAL LOW (ref 13.0–17.0)
Immature Granulocytes: 2 %
Lymphocytes Relative: 25 %
Lymphs Abs: 1.2 10*3/uL (ref 0.7–4.0)
MCH: 32.7 pg (ref 26.0–34.0)
MCHC: 34.2 g/dL (ref 30.0–36.0)
MCV: 95.7 fL (ref 80.0–100.0)
Monocytes Absolute: 0.5 10*3/uL (ref 0.1–1.0)
Monocytes Relative: 9 %
Neutro Abs: 3.2 10*3/uL (ref 1.7–7.7)
Neutrophils Relative %: 62 %
Platelet Count: 161 10*3/uL (ref 150–400)
RBC: 3.91 MIL/uL — ABNORMAL LOW (ref 4.22–5.81)
RDW: 13.5 % (ref 11.5–15.5)
WBC Count: 5 10*3/uL (ref 4.0–10.5)
nRBC: 0 % (ref 0.0–0.2)

## 2022-11-16 LAB — CMP (CANCER CENTER ONLY)
ALT: 23 U/L (ref 0–44)
AST: 48 U/L — ABNORMAL HIGH (ref 15–41)
Albumin: 3.6 g/dL (ref 3.5–5.0)
Alkaline Phosphatase: 103 U/L (ref 38–126)
Anion gap: 8 (ref 5–15)
BUN: 14 mg/dL (ref 8–23)
CO2: 25 mmol/L (ref 22–32)
Calcium: 9.3 mg/dL (ref 8.9–10.3)
Chloride: 103 mmol/L (ref 98–111)
Creatinine: 0.88 mg/dL (ref 0.61–1.24)
GFR, Estimated: >60 mL/min (ref >60)
Glucose, Bld: 130 mg/dL — ABNORMAL HIGH (ref 70–99)
Potassium: 3 mmol/L — ABNORMAL LOW (ref 3.5–5.1)
Sodium: 136 mmol/L (ref 135–145)
Total Bilirubin: 0.6 mg/dL (ref 0.3–1.2)
Total Protein: 6.5 g/dL (ref 6.5–8.1)

## 2022-11-16 MED ORDER — DEXTROSE 5 % IV SOLN
Freq: Once | INTRAVENOUS | Status: AC
  Filled 2022-11-16: qty 250

## 2022-11-16 MED ORDER — POTASSIUM CHLORIDE CRYS ER 20 MEQ PO TBCR
20.0000 meq | EXTENDED_RELEASE_TABLET | Freq: Every day | ORAL | 0 refills | Status: DC
  Filled 2022-11-16: qty 14, 14d supply, fill #0

## 2022-11-16 MED ORDER — LONCASTUXIMAB TESIRINE-LPYL CHEMO INJECTION 10 MG
0.0750 mg/kg | Freq: Once | INTRAVENOUS | Status: AC
  Administered 2022-11-16: 6.1 mg via INTRAVENOUS
  Filled 2022-11-16: qty 1.22

## 2022-11-16 MED ORDER — SODIUM CHLORIDE 0.9% FLUSH
10.0000 mL | INTRAVENOUS | Status: DC | PRN
  Administered 2022-11-16: 10 mL
  Filled 2022-11-16: qty 10

## 2022-11-16 MED ORDER — SODIUM CHLORIDE 0.9 % IV SOLN
10.0000 mg | Freq: Once | INTRAVENOUS | Status: AC
  Administered 2022-11-16: 10 mg via INTRAVENOUS
  Filled 2022-11-16: qty 10

## 2022-11-16 MED ORDER — HEPARIN SOD (PORK) LOCK FLUSH 100 UNIT/ML IV SOLN
500.0000 [IU] | Freq: Once | INTRAVENOUS | Status: AC | PRN
  Administered 2022-11-16: 500 [IU]
  Filled 2022-11-16: qty 5

## 2022-11-16 NOTE — Progress Notes (Signed)
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  This was followed by a PET CT scan which showed extensive FDG avid adenopathy in the neck chest abdomen and pelvis.  FDG avid splenic lesions.  Solitary intramuscular FDG avid lesion in the right biceps femoris muscle.   Supraclavicular excisional lymph node biopsy showed high-grade B-cell lymphoma germinal center type Ki-67 greater than 95%.  FISH testing was positive for Bcl-2 BCL6 and MYC consistent with triple hit lymphoma.  By NCCN IPI score would be 4 based on age and elevated LDH and stage IV (intramuscular biceps femoris lesion) which puts him in the high intermediate risk group. 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   Patient received RCHOP for cycle 1 with plans for [facility] with cycle 2. IT MTX for CNS prophylaxis and he has received 3 cycles but declined further cycles.  MRI brain negative for lymphoma.  Chemotherapy was not being able to escalated upwards due to neutropenia which has persisted to 3 weeks and he has been receiving treatment every 4  weeks   PET CT scan after 3 cycles of chemotherapy showedComplete or near complete metabolic response.  Residual disease in the anterior upper right mediastinum slightly greater than background mediastinal activity   Patient found to have a clinically palpable right supraclavicular lymph node which was subsequently biopsied and was consistent with previously diagnosed lymphoma.  PET CT scan showedNew right level 5 adenopathy in the lower neck with dominant lymph node 1.6 cm Deauville 5.  No other findings of acute active lymphoma in chest abdomen pelvis and bones.  MRI brain negative for leptomeningeal disease.   Patient seen for second opinion by Dr. Aviva Kluver at [facility] for consideration of CAR-T cell therapy.  Patient did not wish to proceed with CAR-T cell treatment after reviewing risks versus benefits.  Moreover patient's health insurance will also not cover CAR-T cell therapy. Patient started on second line tafasitamab/Revlimid regimen in June 2023.  He is on Revlimid 15 mg 3 weeks on and 1 week off.  Disease progression after 3 cycles and patient was switched to [facility] regimen     Patient was noted to haveSignificant decrease in the size of supraclavicular lymph nodes consistent with treatment response Deauville 3 after 3 cycles of Pola BR chemotherapy.  Patient has only received 4 cycles of chemotherapy so far due to persistent neutropenia despite receiving growth factor support.  Repeat PET scan showed new/recurrent hypermetabolic subcutaneous tissue in the high right back/inferior neck consistent with recurrent lymphoma Deauville 5.  He received palliative radiation to that area.    Interval history-patient is tolerating Zynlonta well so far.  He has occasional folliculitis  type of rash especially in his bilateral forearms which has been improving after he has worn full sleeve shirts.  These are self-limited.  ECOG PS- 0 Pain scale- 0   Review of systems- Review of Systems  Constitutional:   Negative for chills, fever, malaise/fatigue and weight loss.  HENT:  Negative for congestion, ear discharge and nosebleeds.   Eyes:  Negative for blurred vision.  Respiratory:  Negative for cough, hemoptysis, sputum production, shortness of breath and wheezing.   Cardiovascular:  Negative for chest pain, palpitations, orthopnea and claudication.  Gastrointestinal:  Negative for abdominal pain, blood in stool, constipation, diarrhea, heartburn, melena, nausea and vomiting.  Genitourinary:  Negative for dysuria, flank pain, frequency, hematuria and urgency.  Musculoskeletal:  Negative for back pain, joint pain and myalgias.  Skin:  Negative for rash.  Neurological:  Negative for dizziness, tingling, focal weakness, seizures, weakness and headaches.  Endo/Heme/Allergies:  Does not bruise/bleed easily.  Psychiatric/Behavioral:  Negative for depression and suicidal ideas. The patient does not have insomnia.       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     Past Surgical History:  Procedure Laterality Date   BONE MARROW BIOPSY     COLONOSCOPY     EXCISION MASS NECK Right 06/12/2020   Procedure: EXCISION MASS NECK;  Surgeon: Leafy Ro, MD;  Location: [facility] ORS;  Service: General;  Laterality: Right;   HERNIA REPAIR Right    at age 22-RIH   14 CATH INSERTION     PORTACATH PLACEMENT Right 06/24/2020   Procedure: INSERTION PORT-A-CATH;  Surgeon: Leafy Ro, MD;  Location: [facility] ORS;  Service: General;  Laterality: Right;    Social History   Socioeconomic History   Marital status: Single    Spouse name: Not on file   Number of children: Not on file   Years of education: Not on file   Highest education level: Not on file  Occupational History   Not on file  Tobacco Use   Smoking status: Former    Current packs/day: 0.00    Types:  Cigarettes    Quit date: 06/13/2020    Years since quitting: 2.4   Smokeless tobacco: Never  Vaping Use   Vaping status: Never Used  Substance and Sexual Activity   Alcohol use: Not Currently   Drug use: Not Currently    Types: Marijuana   Sexual activity: Not Currently  Other Topics Concern   Not on file  Social History Narrative   Has daughter and grandson in the home   Social Determinants of Health   Financial Resource Strain: Medium Risk (07/30/2021)   Received [facility], [facility]   Overall Financial Resource Strain (CARDIA)    Difficulty of Paying Living Expenses: Somewhat hard  Food Insecurity: No Food Insecurity (07/30/2021)   Received from [facility], [facility]   Hunger Vital Sign    Worried About Running Out of Food in the Last Year: Never true    Ran Out of Food in the Last Year: Never true  Transportation Needs: No Transportation Needs (07/30/2021)   [facility]   [facility] - Transportation    Lack of Transportation (Medical): No    Lack of Transportation (Non-Medical): No  Physical Activity: Not on file  Stress: No Stress Concern Present (07/30/2021)   Received from [facility], [facility]   [facility] - Occupational Stress Questionnaire    Feeling of Stress : Not at all  Social Connections: Not on file  Intimate Partner Violence: Not on file    Family History  Problem Relation Age of Onset   Anemia Mother    Hypertension Mother    Goiter Mother    Cancer Father    Cancer Sister    Multiple sclerosis Sister      Current Outpatient Medications:    aspirin EC 81 MG tablet, Take 1 tablet (81 mg total) by mouth daily. Swallow whole., Disp: 30 tablet, Rfl: 0   atenolol (TENORMIN) 25 MG tablet, Take 25 mg by mouth daily., Disp: , Rfl:    cholecalciferol (VITAMIN D3) 25 MCG (1000 UNIT) tablet, Take 1,000 Units by mouth daily., Disp: , Rfl:    dexamethasone  (DECADRON) 4 MG tablet, Take 4 mg tablet at  8 am and  4pm day before chemo, day 2 take 1 tablet at 4 pm only, day 3 take 1 tablet in am and 4 pm. Need to eat before taking med. Repeat the above every 3 weeks, Disp: 10 tablet, Rfl: 3   diphenhydrAMINE (BENADRYL) 2 % cream, Apply 1 Application topically 3 (three) times daily as needed for itching., Disp: , Rfl:    diphenhydrAMINE (BENADRYL) 25 mg capsule, Take 25 mg by mouth every 6 (six) hours as needed for itching or allergies., Disp: , Rfl:    doxycycline (VIBRA-TABS) 100 MG tablet, Take 1 tablet (100 mg total) by mouth 2 (two) times daily., Disp: 14 tablet, Rfl: 0   lisinopril (ZESTRIL) 10 MG tablet, Take 10 mg by mouth daily., Disp: , Rfl:    Omega-3 Fatty Acids (FISH OIL ADULT GUMMIES PO), Take by mouth., Disp: , Rfl:    ondansetron (ZOFRAN) 8 MG tablet, Take 1 tablet (8 mg total) by mouth every 8 (eight) hours as needed for nausea or vomiting., Disp: 30 tablet, Rfl: 1   potassium chloride SA (KLOR-CON M) 20 MEQ tablet, Take 1 tablet (20 mEq total) by mouth daily., Disp: 14 tablet, Rfl: 0   prochlorperazine (COMPAZINE) 10 MG tablet, Take 1 tablet (10 mg total) by mouth every 6 (six) hours as needed for nausea or vomiting., Disp: 30 tablet, Rfl: 1   Tenofovir Alafenamide Fumarate (VEMLIDY) 25 MG TABS, Take 1 tablet (25 mg total) by mouth daily. Take with food., Disp: 30 tablet, Rfl: 2   triamcinolone ointment (KENALOG) 0.5 %, Apply 1 Application topically 2 (two) times daily., Disp: 30 g, Rfl: 1   vitamin C (ASCORBIC ACID) 500 MG tablet, Take 500 mg by mouth daily., Disp: , Rfl:  No current facility-administered medications for this visit.  Facility-Administered Medications Ordered in Other Visits:    0.9 %  sodium chloride infusion, , Intravenous, Continuous, Creig Hines, MD, Stopped at 09/09/20 1507   0.9 %  sodium chloride infusion, , Intravenous, Continuous, Louretta Shorten R, MD, Stopped at 09/11/20 1502   0.9 %  sodium chloride  infusion, , Intravenous, Continuous, Creig Hines, MD, Stopped at 11/06/20 1424   heparin lock flush 100 unit/mL, 500 Units, Intravenous, Once, Creig Hines, MD   heparin lock flush 100 unit/mL, 500 Units, Intracatheter, Once PRN, Creig Hines, MD   loncastuximab tesirine-lpyl (ZYNLONTA) 6.1 mg in dextrose 5 % 50 mL chemo infusion, 0.075 mg/kg (Treatment Plan Recorded), Intravenous, Once,  Creig Hines, MD, Last Rate: 102.4 mL/hr at 11/16/22 1220, 6.1 mg at 11/16/22 1220   methotrexate (PF) 12 mg in sodium chloride (PF) 0.9 % INTRATHECAL chemo injection, , Intrathecal, Once, Creig Hines, MD   sodium chloride flush (NS) 0.9 % injection 10 mL, 10 mL, Intracatheter, PRN, Creig Hines, MD  Physical exam:  Vitals:   11/16/22 0846  BP: (!) 136/91  Pulse: 64  Resp: 18  Temp: (!) 95.6 F (35.3 C)  TempSrc: Tympanic  SpO2: 100%  Weight: 171 lb 3.2 oz (77.7 kg)  Height: 6' 1.2" (1.859 m)   Physical Exam Cardiovascular:     Rate and Rhythm: Normal rate and regular rhythm.     Heart sounds: Normal heart sounds.  Pulmonary:     Effort: Pulmonary effort is normal.     Breath sounds: Normal breath sounds.  Abdominal:     General: Bowel sounds are normal.     Palpations: Abdomen is soft.  Lymphadenopathy:     Comments: No palpable cervical, supraclavicular, axillary or inguinal adenopathy    Skin:    General: Skin is warm and dry.  Neurological:     Mental Status: He is alert and oriented to person, place, and time.         Latest Ref Rng & Units 11/16/2022    8:37 AM  CMP  Glucose 70 - 99 mg/dL 161   BUN 8 - 23 mg/dL 14   Creatinine 0.96 - 1.24 mg/dL 0.45   Sodium 409 - 811 mmol/L 136   Potassium 3.5 - 5.1 mmol/L 3.0   Chloride 98 - 111 mmol/L 103   CO2 22 - 32 mmol/L 25   Calcium 8.9 - 10.3 mg/dL 9.3   Total Protein 6.5 - 8.1 g/dL 6.5   Total Bilirubin 0.3 - 1.2 mg/dL 0.6   Alkaline Phos 38 - 126 U/L 103   AST 15 - 41 U/L 48   ALT 0 - 44 U/L 23       Latest Ref  Rng & Units 11/16/2022    8:37 AM  CBC  WBC 4.0 - 10.5 K/uL 5.0   Hemoglobin 13.0 - 17.0 g/dL 91.4   Hematocrit 78.2 - 52.0 % 37.4   Platelets 150 - 400 K/uL 161      Assessment and plan- Patient is a 62 y.o. 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  He is here for on treatment assessment prior to cycle 5 of Zynlonta..  Clinically patient is doing well with no concerning signs and symptoms of recurrence based on today's exam.  Counts are otherwise okay to proceed with cycle 5 of Zynlonta today with growth factor support.  I will see him back in 3 weeks for cycle 6.  I plan to do treatments for at least 4 more cycles followed by repeat scans and then consider a treatment break for him.   Visit Diagnosis 1. High grade B-cell lymphoma (HCC)   2. Encounter for antineoplastic chemotherapy      Dr. Owens Shark, MD, MPH [facility] [phone] 11/16/2022 12:49 PM

## 2022-11-16 NOTE — Patient Instructions (Signed)
[place] [facility]  Discharge Instructions: Thank you for choosing [facility] to provide your oncology and hematology care.  If you have a lab appointment with the Cancer Center, please go directly to the Cancer Center and check in at the registration area.  Wear comfortable clothing and clothing appropriate for easy access to any Portacath or PICC line.   We strive to give you quality time with your provider. You may need to reschedule your appointment if you arrive late (15 or more minutes).  Arriving late affects you and other patients whose appointments are after yours.  Also, if you miss three or more appointments without notifying the office, you may be dismissed from the clinic at the provider's discretion.      For prescription refill requests, have your pharmacy contact our office and allow 72 hours for refills to be completed.    Today you received the following chemotherapy and/or immunotherapy agents- zynlonta      To help prevent nausea and vomiting after your treatment, we encourage you to take your nausea medication as directed.  BELOW ARE SYMPTOMS THAT SHOULD BE REPORTED IMMEDIATELY: *FEVER GREATER THAN 100.4 F (38 C) OR HIGHER *CHILLS OR SWEATING *NAUSEA AND VOMITING THAT IS NOT CONTROLLED WITH YOUR NAUSEA MEDICATION *UNUSUAL SHORTNESS OF BREATH *UNUSUAL BRUISING OR BLEEDING *URINARY PROBLEMS (pain or burning when urinating, or frequent urination) *BOWEL PROBLEMS (unusual diarrhea, constipation, pain near the anus) TENDERNESS IN MOUTH AND THROAT WITH OR WITHOUT PRESENCE OF ULCERS (sore throat, sores in mouth, or a toothache) UNUSUAL RASH, SWELLING OR PAIN  UNUSUAL VAGINAL DISCHARGE OR ITCHING   Items with * indicate a potential emergency and should be followed up as soon as possible or go to the Emergency Department if any problems should occur.  Please show the CHEMOTHERAPY ALERT CARD or IMMUNOTHERAPY ALERT CARD at check-in to  the Emergency Department and triage nurse.  Should you have questions after your visit or need to cancel or reschedule your appointment, please contact [place] [facility]  [phone] and follow the prompts.  Office hours are 8:00 a.m. to 4:30 p.m. Monday - Friday. Please note that voicemails left after 4:00 p.m. may not be returned until the following business day.  We are closed weekends and major holidays. You have access to a nurse at all times for urgent questions. Please call the main number to the clinic [phone] and follow the prompts.  For any non-urgent questions, you may also contact your provider using MyChart. We now offer e-Visits for anyone 103 and older to request care online for non-urgent symptoms. For details visit [email or web address].   Also download the MyChart app! Go to the app store, search "MyChart", open the app, select [place], and log in with your MyChart username and password.

## 2022-11-16 NOTE — Telephone Encounter (Signed)
Oral Oncology Patient Advocate Encounter  Re-authorization   Received notification that prior authorization for Christopher Mills is due for renewal.   PA submitted on 11/16/22  Key [id number]  Status is pending     Ardeen Fillers, CPhT Oncology Pharmacy Patient Advocate  [facility]  [phone] (phone) [phone] (fax) 11/16/2022 10:41 AM

## 2022-11-16 NOTE — Telephone Encounter (Signed)
Oral Oncology Patient Advocate Encounter  Prior Authorization for Wynonia Sours has been approved.    PA# [id number]  Effective dates: 11/16/22 through 11/16/23  Patient may continue to fill, per usual, at [facility].    Ardeen Fillers, CPhT Oncology Pharmacy Patient Advocate  [facility]  [phone] (phone) [phone] (fax) 11/16/2022 11:28 AM

## 2022-11-16 NOTE — Telephone Encounter (Signed)
I spoke to pt today and he was wanted to know if he  needs to cont. With decadron and I asked Christopher Mills and he says he can stop and I told him that. He is ok with this

## 2022-11-17 ENCOUNTER — Other Ambulatory Visit: Payer: Self-pay

## 2022-11-18 ENCOUNTER — Inpatient Hospital Stay: Payer: Medicaid Other

## 2022-11-18 DIAGNOSIS — Z5112 Encounter for antineoplastic immunotherapy: Secondary | ICD-10-CM | POA: Diagnosis not present

## 2022-11-18 DIAGNOSIS — C8510 Unspecified B-cell lymphoma, unspecified site: Secondary | ICD-10-CM

## 2022-11-18 MED ORDER — PEGFILGRASTIM-CBQV 6 MG/0.6ML ~~LOC~~ SOSY
6.0000 mg | PREFILLED_SYRINGE | Freq: Once | SUBCUTANEOUS | Status: AC
  Administered 2022-11-18: 6 mg via SUBCUTANEOUS
  Filled 2022-11-18: qty 0.6

## 2022-12-01 ENCOUNTER — Other Ambulatory Visit: Payer: Self-pay

## 2022-12-06 ENCOUNTER — Other Ambulatory Visit (HOSPITAL_COMMUNITY): Payer: Self-pay

## 2022-12-06 MED FILL — Dexamethasone Sodium Phosphate Inj 100 MG/10ML: INTRAMUSCULAR | Qty: 1 | Status: AC

## 2022-12-07 ENCOUNTER — Encounter: Payer: Self-pay | Admitting: Oncology

## 2022-12-07 ENCOUNTER — Other Ambulatory Visit: Payer: Self-pay | Admitting: *Deleted

## 2022-12-07 ENCOUNTER — Inpatient Hospital Stay: Payer: Medicaid Other

## 2022-12-07 ENCOUNTER — Other Ambulatory Visit: Payer: Self-pay

## 2022-12-07 ENCOUNTER — Inpatient Hospital Stay (HOSPITAL_BASED_OUTPATIENT_CLINIC_OR_DEPARTMENT_OTHER): Payer: Medicaid Other | Admitting: Oncology

## 2022-12-07 VITALS — BP 137/75 | HR 64 | Temp 97.0°F | Resp 19

## 2022-12-07 VITALS — BP 147/84 | HR 67 | Temp 96.4°F | Resp 18 | Ht 73.0 in | Wt 178.8 lb

## 2022-12-07 DIAGNOSIS — E876 Hypokalemia: Secondary | ICD-10-CM | POA: Diagnosis not present

## 2022-12-07 DIAGNOSIS — L270 Generalized skin eruption due to drugs and medicaments taken internally: Secondary | ICD-10-CM

## 2022-12-07 DIAGNOSIS — C8510 Unspecified B-cell lymphoma, unspecified site: Secondary | ICD-10-CM

## 2022-12-07 DIAGNOSIS — Z5111 Encounter for antineoplastic chemotherapy: Secondary | ICD-10-CM

## 2022-12-07 DIAGNOSIS — Z5112 Encounter for antineoplastic immunotherapy: Secondary | ICD-10-CM | POA: Diagnosis not present

## 2022-12-07 LAB — CMP (CANCER CENTER ONLY)
ALT: 24 U/L (ref 0–44)
AST: 49 U/L — ABNORMAL HIGH (ref 15–41)
Albumin: 3.7 g/dL (ref 3.5–5.0)
Alkaline Phosphatase: 92 U/L (ref 38–126)
Anion gap: 8 (ref 5–15)
BUN: 11 mg/dL (ref 8–23)
CO2: 25 mmol/L (ref 22–32)
Calcium: 9.1 mg/dL (ref 8.9–10.3)
Chloride: 104 mmol/L (ref 98–111)
Creatinine: 0.94 mg/dL (ref 0.61–1.24)
GFR, Estimated: >60 mL/min (ref >60)
Glucose, Bld: 139 mg/dL — ABNORMAL HIGH (ref 70–99)
Potassium: 3.1 mmol/L — ABNORMAL LOW (ref 3.5–5.1)
Sodium: 137 mmol/L (ref 135–145)
Total Bilirubin: 0.6 mg/dL (ref 0.3–1.2)
Total Protein: 6.6 g/dL (ref 6.5–8.1)

## 2022-12-07 LAB — CBC WITH DIFFERENTIAL (CANCER CENTER ONLY)
Abs Immature Granulocytes: 0.28 10*3/uL — ABNORMAL HIGH (ref 0.00–0.07)
Basophils Absolute: 0.1 10*3/uL (ref 0.0–0.1)
Basophils Relative: 1 %
Eosinophils Absolute: 0 10*3/uL (ref 0.0–0.5)
Eosinophils Relative: 0 %
HCT: 36.6 % — ABNORMAL LOW (ref 39.0–52.0)
Hemoglobin: 12.2 g/dL — ABNORMAL LOW (ref 13.0–17.0)
Immature Granulocytes: 4 %
Lymphocytes Relative: 19 %
Lymphs Abs: 1.3 10*3/uL (ref 0.7–4.0)
MCH: 32.4 pg (ref 26.0–34.0)
MCHC: 33.3 g/dL (ref 30.0–36.0)
MCV: 97.1 fL (ref 80.0–100.0)
Monocytes Absolute: 0.6 10*3/uL (ref 0.1–1.0)
Monocytes Relative: 8 %
Neutro Abs: 4.7 10*3/uL (ref 1.7–7.7)
Neutrophils Relative %: 68 %
Platelet Count: 180 10*3/uL (ref 150–400)
RBC: 3.77 MIL/uL — ABNORMAL LOW (ref 4.22–5.81)
RDW: 13.4 % (ref 11.5–15.5)
WBC Count: 6.9 10*3/uL (ref 4.0–10.5)
nRBC: 0 % (ref 0.0–0.2)

## 2022-12-07 MED ORDER — POTASSIUM CHLORIDE CRYS ER 20 MEQ PO TBCR
20.0000 meq | EXTENDED_RELEASE_TABLET | Freq: Every day | ORAL | 0 refills | Status: DC
  Filled 2022-12-07 (×2): qty 21, 21d supply, fill #0

## 2022-12-07 MED ORDER — SODIUM CHLORIDE 0.9% FLUSH
10.0000 mL | INTRAVENOUS | Status: DC | PRN
  Administered 2022-12-07: 10 mL via INTRAVENOUS
  Filled 2022-12-07: qty 10

## 2022-12-07 MED ORDER — DEXTROSE 5 % IV SOLN
Freq: Once | INTRAVENOUS | Status: AC
  Filled 2022-12-07: qty 250

## 2022-12-07 MED ORDER — TRIAMCINOLONE ACETONIDE 0.5 % EX OINT
1.0000 | TOPICAL_OINTMENT | Freq: Two times a day (BID) | CUTANEOUS | 3 refills | Status: DC
  Filled 2022-12-07: qty 30, 30d supply, fill #0

## 2022-12-07 MED ORDER — HEPARIN SOD (PORK) LOCK FLUSH 100 UNIT/ML IV SOLN
500.0000 [IU] | Freq: Once | INTRAVENOUS | Status: AC | PRN
  Administered 2022-12-07: 500 [IU]
  Filled 2022-12-07: qty 5

## 2022-12-07 MED ORDER — LONCASTUXIMAB TESIRINE-LPYL CHEMO INJECTION 10 MG
0.0750 mg/kg | Freq: Once | INTRAVENOUS | Status: AC
  Administered 2022-12-07: 6.1 mg via INTRAVENOUS
  Filled 2022-12-07: qty 1.22

## 2022-12-07 MED ORDER — SODIUM CHLORIDE 0.9 % IV SOLN
10.0000 mg | Freq: Once | INTRAVENOUS | Status: AC
  Administered 2022-12-07: 10 mg via INTRAVENOUS
  Filled 2022-12-07: qty 10

## 2022-12-07 NOTE — Progress Notes (Signed)
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  This was followed by a PET CT scan which showed extensive FDG avid adenopathy in the neck chest abdomen and pelvis.  FDG avid splenic lesions.  Solitary intramuscular FDG avid lesion in the right biceps femoris muscle.   Supraclavicular excisional lymph node biopsy showed high-grade B-cell lymphoma germinal center type Ki-67 greater than 95%.  FISH testing was positive for Bcl-2 BCL6 and MYC consistent with triple hit lymphoma.  By NCCN IPI score would be 4 based on age and elevated LDH and stage IV (intramuscular biceps femoris lesion) which puts him in the high intermediate risk group. 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   Patient received RCHOP for cycle 1 with plans for [facility] with cycle 2. IT MTX for CNS prophylaxis and he has received 3 cycles but declined further cycles.  MRI brain negative for lymphoma.  Chemotherapy was not being able to escalated upwards due to neutropenia which has persisted to 3 weeks and he has been receiving treatment every 4  weeks   PET CT scan after 3 cycles of chemotherapy showedComplete or near complete metabolic response.  Residual disease in the anterior upper right mediastinum slightly greater than background mediastinal activity   Patient found to have a clinically palpable right supraclavicular lymph node which was subsequently biopsied and was consistent with previously diagnosed lymphoma.  PET CT scan showedNew right level 5 adenopathy in the lower neck with dominant lymph node 1.6 cm Deauville 5.  No other findings of acute active lymphoma in chest abdomen pelvis and bones.  MRI brain negative for leptomeningeal disease.   Patient seen for second opinion by Dr. Aviva Kluver at [facility] for consideration of CAR-T cell therapy.  Patient did not wish to proceed with CAR-T cell treatment after reviewing risks versus benefits.  Moreover patient's health insurance will also not cover CAR-T cell therapy. Patient started on second line tafasitamab/Revlimid regimen in June 2023.  He is on Revlimid 15 mg 3 weeks on and 1 week off.  Disease progression after 3 cycles and patient was switched to [facility] regimen     Patient was noted to haveSignificant decrease in the size of supraclavicular lymph nodes consistent with treatment response Deauville 3 after 3 cycles of Pola BR chemotherapy.  Patient has only received 4 cycles of chemotherapy so far due to persistent neutropenia despite receiving growth factor support.  Repeat PET scan showed new/recurrent hypermetabolic subcutaneous tissue in the high right back/inferior neck consistent with recurrent lymphoma Deauville 5.  He received palliative radiation to that area.    Interval history-tolerating treatments well soFar.  Denies any significant side effects.  Appetite and weight have remained stable.  ECOG PS- 0 Pain scale- 0  Review of systems- Review of Systems  Constitutional:  Negative for chills, fever, malaise/fatigue and weight loss.  HENT:  Negative for congestion, ear  discharge and nosebleeds.   Eyes:  Negative for blurred vision.  Respiratory:  Negative for cough, hemoptysis, sputum production, shortness of breath and wheezing.   Cardiovascular:  Negative for chest pain, palpitations, orthopnea and claudication.  Gastrointestinal:  Negative for abdominal pain, blood in stool, constipation, diarrhea, heartburn, melena, nausea and vomiting.  Genitourinary:  Negative for dysuria, flank pain, frequency, hematuria and urgency.  Musculoskeletal:  Negative for back pain, joint pain and myalgias.  Skin:  Negative for rash.  Neurological:  Negative for dizziness, tingling, focal weakness, seizures, weakness and headaches.  Endo/Heme/Allergies:  Does not bruise/bleed easily.  Psychiatric/Behavioral:  Negative for depression and suicidal ideas. The patient does not have insomnia.       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     Past Surgical History:  Procedure Laterality Date   BONE MARROW BIOPSY     COLONOSCOPY     EXCISION MASS NECK Right 06/12/2020   Procedure: EXCISION MASS NECK;  Surgeon: Leafy Ro, MD;  Location: [facility] ORS;  Service: General;  Laterality: Right;   HERNIA REPAIR Right    at age 50-RIH   105 CATH INSERTION     PORTACATH PLACEMENT Right 06/24/2020   Procedure: INSERTION PORT-A-CATH;  Surgeon: Leafy Ro, MD;  Location: [facility] ORS;  Service: General;  Laterality: Right;    Social History   Socioeconomic History   Marital status: Single    Spouse name: Not on file   Number of children: Not on file   Years of education: Not on file   Highest education level: Not on file  Occupational History   Not on file  Tobacco Use   Smoking status: Former    Current packs/day: 0.00    Types: Cigarettes    Quit date: 06/13/2020    Years since quitting: 2.4   Smokeless tobacco: Never  Vaping  Use   Vaping status: Never Used  Substance and Sexual Activity   Alcohol use: Not Currently   Drug use: Not Currently    Types: Marijuana   Sexual activity: Not Currently  Other Topics Concern   Not on file  Social History Narrative   Has daughter and grandson in the home   Social Determinants of Health   Financial Resource Strain: Medium Risk (07/30/2021)   Received from [facility], [facility]   Overall Financial Resource Strain (CARDIA)    Difficulty of Paying Living Expenses: Somewhat hard  Food Insecurity: No Food Insecurity (07/30/2021)   Received from [facility], [facility]   Hunger Vital Sign    Worried About Running Out of Food in the Last Year: Never true    Ran Out of Food in the Last Year: Never true  Transportation Needs: No Transportation Needs (07/30/2021)   Received from [facility], [facility]   [facility] - Transportation    Lack of Transportation (Medical): No    Lack of Transportation (Non-Medical): No  Physical Activity: Not on file  Stress: No Stress Concern Present (07/30/2021)   Received from [facility], [facility]  [employer] of Occupational Health - Occupational Stress Questionnaire    Feeling of Stress : Not at all  Social Connections: Not on file  Intimate Partner Violence: Not on file    Family History  Problem Relation Age of Onset   Anemia Mother    Hypertension Mother    Goiter Mother    Cancer Father    Cancer Sister    Multiple sclerosis Sister      Current Outpatient Medications:    aspirin EC 81 MG tablet, Take 1 tablet (81 mg total) by mouth daily. Swallow whole., Disp: 30 tablet, Rfl: 0   atenolol (TENORMIN) 25 MG tablet, Take 25 mg by mouth daily., Disp: , Rfl:    cholecalciferol (VITAMIN D3) 25 MCG (1000 UNIT) tablet, Take 1,000 Units by mouth daily., Disp: , Rfl:    dexamethasone (DECADRON) 4 MG tablet, Take 4 mg tablet at  8 am and  4pm day before chemo, day 2 take 1 tablet at 4 pm  only, day 3 take 1 tablet in am and 4 pm. Need to eat before taking med. Repeat the above every 3 weeks, Disp: 10 tablet, Rfl: 3   diphenhydrAMINE (BENADRYL) 2 % cream, Apply 1 Application topically 3 (three) times daily as needed for itching., Disp: , Rfl:    diphenhydrAMINE (BENADRYL) 25 mg capsule, Take 25 mg by mouth every 6 (six) hours as needed for itching or allergies., Disp: , Rfl:    doxycycline (VIBRA-TABS) 100 MG tablet, Take 1 tablet (100 mg total) by mouth 2 (two) times daily., Disp: 14 tablet, Rfl: 0   lisinopril (ZESTRIL) 10 MG tablet, Take 10 mg by mouth daily., Disp: , Rfl:    Omega-3 Fatty Acids (FISH OIL ADULT GUMMIES PO), Take by mouth., Disp: , Rfl:    potassium chloride SA (KLOR-CON M) 20 MEQ tablet, Take 1 tablet (20 mEq total) by mouth daily., Disp: 14 tablet, Rfl: 0   prochlorperazine (COMPAZINE) 10 MG tablet, Take 1 tablet (10 mg total) by mouth every 6 (six) hours as needed for nausea or vomiting., Disp: 30 tablet, Rfl: 1   Tenofovir Alafenamide Fumarate (VEMLIDY) 25 MG TABS, Take 1 tablet (25 mg total) by mouth daily. Take with food., Disp: 30 tablet, Rfl: 2   vitamin C (ASCORBIC ACID) 500 MG tablet, Take 500 mg by mouth daily., Disp: , Rfl:    ondansetron (ZOFRAN) 8 MG tablet, Take 1 tablet (8 mg total) by mouth every 8 (eight) hours as needed for nausea or vomiting. (Patient not taking: Reported on 12/07/2022), Disp: 30 tablet, Rfl: 1   triamcinolone ointment (KENALOG) 0.5 %, Apply 1 Application topically 2 (two) times daily., Disp: 30 g, Rfl: 3 No current facility-administered medications for this visit.  Facility-Administered Medications Ordered in Other Visits:    0.9 %  sodium chloride infusion, , Intravenous, Continuous, Creig Hines, MD, Stopped at 09/09/20 1507   0.9 %  sodium chloride infusion, , Intravenous, Continuous, Louretta Shorten R, MD, Stopped at 09/11/20 1502   0.9 %  sodium chloride infusion, , Intravenous, Continuous, Creig Hines, MD, Stopped  at 11/06/20 1424   heparin lock flush 100 unit/mL, 500 Units, Intravenous, Once, Creig Hines, MD   methotrexate (PF) 12 mg in sodium chloride (PF) 0.9 % INTRATHECAL chemo injection, , Intrathecal, Once, Creig Hines, MD   sodium chloride flush (NS) 0.9 % injection 10 mL, 10 mL, Intravenous, PRN, Creig Hines, MD, 10 mL at 12/07/22 8119  Physical exam:  Vitals:   12/07/22 0847 12/07/22 0851  BP: (!) 151/83 (!) 147/84  Pulse: 67   Resp: 18   Temp: (!) 96.4 F (35.8 C)   TempSrc: Tympanic   SpO2: 100%   Weight: 178 lb 12.8 oz (81.1 kg)   Height: 6\' 1"  (1.854 m)    Physical Exam Cardiovascular:     Rate and Rhythm: Normal rate and regular rhythm.     Heart sounds: Normal heart sounds.  Pulmonary:     Effort: Pulmonary effort is normal.     Breath sounds: Normal breath sounds.  Abdominal:     General: Bowel sounds are normal.     Palpations: Abdomen is soft.  Lymphadenopathy:     Comments: No palpable cervical, supraclavicular, axillary or inguinal adenopathy    Skin:    General: Skin is warm and dry.  Neurological:     Mental Status: He is alert and oriented to person, place, and time.         Latest Ref Rng & Units 12/07/2022    8:17 AM  CMP  Glucose 70 - 99 mg/dL 213   BUN 8 - 23 mg/dL 11   Creatinine 0.86 - 1.24 mg/dL 5.78   Sodium 469 - 629 mmol/L 137   Potassium 3.5 - 5.1 mmol/L 3.1   Chloride 98 - 111 mmol/L 104   CO2 22 - 32 mmol/L 25   Calcium 8.9 - 10.3 mg/dL 9.1   Total Protein 6.5 - 8.1 g/dL 6.6   Total Bilirubin 0.3 - 1.2 mg/dL 0.6   Alkaline Phos 38 - 126 U/L 92   AST 15 - 41 U/L 49   ALT 0 - 44 U/L 24       Latest Ref Rng & Units 12/07/2022    8:18 AM  CBC  WBC 4.0 - 10.5 K/uL 6.9   Hemoglobin 13.0 - 17.0 g/dL 52.8   Hematocrit 41.3 - 52.0 % 36.6   Platelets 150 - 400 K/uL 180     Assessment and plan- Patient is a 62 y.o. 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  He is here for on treatment  assessment prior to cycle 6 of Zynlonta  Counts okay to proceed with cycle 6 of Zynlonta today.  Clinically he is doing well with no B symptoms or palpable adenopathy.  I will plan to give him 2 more cycles before repeating PET scan.  If PET scan continues to show no evidence of recurrence I will consider giving him a temporary break from [place].  Drug-induced skin rash: Patient has scattered folliculitis like eruptions which come and go.  Topical steroids have been helping and we will renew that  Hypokalemia: We will renew oral potassium 20 mEq daily   Visit Diagnosis 1. High grade B-cell lymphoma (HCC)   2. Encounter for antineoplastic chemotherapy   3. Hypokalemia   4. Drug-induced skin rash      Dr. Owens Shark, MD, MPH [facility] [phone] 12/07/2022 9:06 AM

## 2022-12-07 NOTE — Patient Instructions (Signed)
[facility]  Discharge Instructions: Thank you for choosing [facility] to provide your oncology and hematology care.  If you have a lab appointment with the Cancer Center, please go directly to the Cancer Center and check in at the registration area.  Wear comfortable clothing and clothing appropriate for easy access to any Portacath or PICC line.   We strive to give you quality time with your provider. You may need to reschedule your appointment if you arrive late (15 or more minutes).  Arriving late affects you and other patients whose appointments are after yours.  Also, if you miss three or more appointments without notifying the office, you may be dismissed from the clinic at the provider's discretion.      For prescription refill requests, have your pharmacy contact our office and allow 72 hours for refills to be completed.    Today you received the following chemotherapy and/or immunotherapy agents- zynlonta      To help prevent nausea and vomiting after your treatment, we encourage you to take your nausea medication as directed.  BELOW ARE SYMPTOMS THAT SHOULD BE REPORTED IMMEDIATELY: *FEVER GREATER THAN 100.4 F (38 C) OR HIGHER *CHILLS OR SWEATING *NAUSEA AND VOMITING THAT IS NOT CONTROLLED WITH YOUR NAUSEA MEDICATION *UNUSUAL SHORTNESS OF BREATH *UNUSUAL BRUISING OR BLEEDING *URINARY PROBLEMS (pain or burning when urinating, or frequent urination) *BOWEL PROBLEMS (unusual diarrhea, constipation, pain near the anus) TENDERNESS IN MOUTH AND THROAT WITH OR WITHOUT PRESENCE OF ULCERS (sore throat, sores in mouth, or a toothache) UNUSUAL RASH, SWELLING OR PAIN  UNUSUAL VAGINAL DISCHARGE OR ITCHING   Items with * indicate a potential emergency and should be followed up as soon as possible or go to the Emergency Department if any problems should occur.  Please show the CHEMOTHERAPY ALERT CARD or IMMUNOTHERAPY ALERT CARD at check-in to  the Emergency Department and triage nurse.  Should you have questions after your visit or need to cancel or reschedule your appointment, please contact [place] [facility]  [phone] and follow the prompts.  Office hours are 8:00 a.m. to 4:30 p.m. Monday - Friday. Please note that voicemails left after 4:00 p.m. may not be returned until the following business day.  We are closed weekends and major holidays. You have access to a nurse at all times for urgent questions. Please call the main number to the clinic [phone] and follow the prompts.  For any non-urgent questions, you may also contact your provider using MyChart. We now offer e-Visits for anyone 72 and older to request care online for non-urgent symptoms. For details visit [email or web address].   Also download the MyChart app! Go to the app store, search "MyChart", open the app, select [place], and log in with your MyChart username and password.

## 2022-12-08 ENCOUNTER — Other Ambulatory Visit: Payer: Self-pay

## 2022-12-09 ENCOUNTER — Encounter: Payer: Self-pay | Admitting: Oncology

## 2022-12-09 ENCOUNTER — Other Ambulatory Visit: Payer: Self-pay

## 2022-12-09 ENCOUNTER — Inpatient Hospital Stay: Payer: Medicaid Other

## 2022-12-09 DIAGNOSIS — Z5112 Encounter for antineoplastic immunotherapy: Secondary | ICD-10-CM | POA: Diagnosis not present

## 2022-12-09 DIAGNOSIS — C8510 Unspecified B-cell lymphoma, unspecified site: Secondary | ICD-10-CM

## 2022-12-09 MED ORDER — PEGFILGRASTIM-CBQV 6 MG/0.6ML ~~LOC~~ SOSY
6.0000 mg | PREFILLED_SYRINGE | Freq: Once | SUBCUTANEOUS | Status: AC
  Administered 2022-12-09: 6 mg via SUBCUTANEOUS
  Filled 2022-12-09: qty 0.6

## 2022-12-10 ENCOUNTER — Encounter: Payer: Self-pay | Admitting: Oncology

## 2022-12-14 ENCOUNTER — Encounter: Payer: Self-pay | Admitting: Oncology

## 2022-12-27 MED FILL — Dexamethasone Sodium Phosphate Inj 100 MG/10ML: INTRAMUSCULAR | Qty: 1 | Status: AC

## 2022-12-28 ENCOUNTER — Encounter: Payer: Self-pay | Admitting: Oncology

## 2022-12-28 ENCOUNTER — Inpatient Hospital Stay (HOSPITAL_BASED_OUTPATIENT_CLINIC_OR_DEPARTMENT_OTHER): Payer: Medicaid Other | Admitting: Oncology

## 2022-12-28 ENCOUNTER — Inpatient Hospital Stay: Payer: Medicaid Other

## 2022-12-28 ENCOUNTER — Inpatient Hospital Stay: Payer: Medicaid Other | Attending: Oncology

## 2022-12-28 VITALS — BP 151/80 | HR 70 | Temp 96.9°F | Resp 19

## 2022-12-28 VITALS — BP 130/79 | HR 75 | Temp 95.4°F | Resp 18 | Wt 177.0 lb

## 2022-12-28 DIAGNOSIS — C8331 Diffuse large B-cell lymphoma, lymph nodes of head, face, and neck: Secondary | ICD-10-CM | POA: Diagnosis present

## 2022-12-28 DIAGNOSIS — Z5111 Encounter for antineoplastic chemotherapy: Secondary | ICD-10-CM | POA: Diagnosis not present

## 2022-12-28 DIAGNOSIS — Z79899 Other long term (current) drug therapy: Secondary | ICD-10-CM | POA: Diagnosis not present

## 2022-12-28 DIAGNOSIS — Z7961 Long term (current) use of immunomodulator: Secondary | ICD-10-CM | POA: Diagnosis not present

## 2022-12-28 DIAGNOSIS — Z83438 Family history of other disorder of lipoprotein metabolism and other lipidemia: Secondary | ICD-10-CM | POA: Diagnosis not present

## 2022-12-28 DIAGNOSIS — Z87891 Personal history of nicotine dependence: Secondary | ICD-10-CM | POA: Insufficient documentation

## 2022-12-28 DIAGNOSIS — C8510 Unspecified B-cell lymphoma, unspecified site: Secondary | ICD-10-CM

## 2022-12-28 DIAGNOSIS — Z832 Family history of diseases of the blood and blood-forming organs and certain disorders involving the immune mechanism: Secondary | ICD-10-CM | POA: Insufficient documentation

## 2022-12-28 DIAGNOSIS — Z79624 Long term (current) use of inhibitors of nucleotide synthesis: Secondary | ICD-10-CM | POA: Diagnosis not present

## 2022-12-28 DIAGNOSIS — Z79631 Long term (current) use of antimetabolite agent: Secondary | ICD-10-CM | POA: Diagnosis not present

## 2022-12-28 DIAGNOSIS — Z5189 Encounter for other specified aftercare: Secondary | ICD-10-CM | POA: Insufficient documentation

## 2022-12-28 DIAGNOSIS — Z809 Family history of malignant neoplasm, unspecified: Secondary | ICD-10-CM | POA: Insufficient documentation

## 2022-12-28 DIAGNOSIS — Z5112 Encounter for antineoplastic immunotherapy: Secondary | ICD-10-CM | POA: Diagnosis present

## 2022-12-28 DIAGNOSIS — R590 Localized enlarged lymph nodes: Secondary | ICD-10-CM | POA: Insufficient documentation

## 2022-12-28 DIAGNOSIS — Z515 Encounter for palliative care: Secondary | ICD-10-CM | POA: Insufficient documentation

## 2022-12-28 DIAGNOSIS — Z8269 Family history of other diseases of the musculoskeletal system and connective tissue: Secondary | ICD-10-CM | POA: Diagnosis not present

## 2022-12-28 DIAGNOSIS — Z8249 Family history of ischemic heart disease and other diseases of the circulatory system: Secondary | ICD-10-CM | POA: Insufficient documentation

## 2022-12-28 DIAGNOSIS — Z5986 Financial insecurity: Secondary | ICD-10-CM | POA: Diagnosis not present

## 2022-12-28 DIAGNOSIS — M2550 Pain in unspecified joint: Secondary | ICD-10-CM | POA: Insufficient documentation

## 2022-12-28 LAB — CMP (CANCER CENTER ONLY)
ALT: 18 U/L (ref 0–44)
AST: 36 U/L (ref 15–41)
Albumin: 3.4 g/dL — ABNORMAL LOW (ref 3.5–5.0)
Alkaline Phosphatase: 92 U/L (ref 38–126)
Anion gap: 6 (ref 5–15)
BUN: 9 mg/dL (ref 8–23)
CO2: 25 mmol/L (ref 22–32)
Calcium: 8.6 mg/dL — ABNORMAL LOW (ref 8.9–10.3)
Chloride: 106 mmol/L (ref 98–111)
Creatinine: 0.74 mg/dL (ref 0.61–1.24)
GFR, Estimated: >60 mL/min (ref >60)
Glucose, Bld: 86 mg/dL (ref 70–99)
Potassium: 3.5 mmol/L (ref 3.5–5.1)
Sodium: 137 mmol/L (ref 135–145)
Total Bilirubin: 0.4 mg/dL (ref 0.3–1.2)
Total Protein: 6.2 g/dL — ABNORMAL LOW (ref 6.5–8.1)

## 2022-12-28 LAB — CBC WITH DIFFERENTIAL (CANCER CENTER ONLY)
Abs Immature Granulocytes: 0.07 10*3/uL (ref 0.00–0.07)
Basophils Absolute: 0 10*3/uL (ref 0.0–0.1)
Basophils Relative: 1 %
Eosinophils Absolute: 0.1 10*3/uL (ref 0.0–0.5)
Eosinophils Relative: 2 %
HCT: 35.1 % — ABNORMAL LOW (ref 39.0–52.0)
Hemoglobin: 11.9 g/dL — ABNORMAL LOW (ref 13.0–17.0)
Immature Granulocytes: 2 %
Lymphocytes Relative: 20 %
Lymphs Abs: 0.9 10*3/uL (ref 0.7–4.0)
MCH: 32.8 pg (ref 26.0–34.0)
MCHC: 33.9 g/dL (ref 30.0–36.0)
MCV: 96.7 fL (ref 80.0–100.0)
Monocytes Absolute: 0.5 10*3/uL (ref 0.1–1.0)
Monocytes Relative: 12 %
Neutro Abs: 3 10*3/uL (ref 1.7–7.7)
Neutrophils Relative %: 63 %
Platelet Count: 162 10*3/uL (ref 150–400)
RBC: 3.63 MIL/uL — ABNORMAL LOW (ref 4.22–5.81)
RDW: 13.6 % (ref 11.5–15.5)
WBC Count: 4.6 10*3/uL (ref 4.0–10.5)
nRBC: 0 % (ref 0.0–0.2)

## 2022-12-28 MED ORDER — SODIUM CHLORIDE 0.9 % IV SOLN
10.0000 mg | Freq: Once | INTRAVENOUS | Status: AC
  Administered 2022-12-28: 10 mg via INTRAVENOUS
  Filled 2022-12-28: qty 10

## 2022-12-28 MED ORDER — LONCASTUXIMAB TESIRINE-LPYL CHEMO INJECTION 10 MG
0.0750 mg/kg | Freq: Once | INTRAVENOUS | Status: AC
  Administered 2022-12-28: 6.1 mg via INTRAVENOUS
  Filled 2022-12-28: qty 1.22

## 2022-12-28 MED ORDER — DEXTROSE 5 % IV SOLN
Freq: Once | INTRAVENOUS | Status: AC
  Filled 2022-12-28: qty 250

## 2022-12-28 MED ORDER — HEPARIN SOD (PORK) LOCK FLUSH 100 UNIT/ML IV SOLN
500.0000 [IU] | Freq: Once | INTRAVENOUS | Status: AC | PRN
  Administered 2022-12-28: 500 [IU]
  Filled 2022-12-28: qty 5

## 2022-12-28 MED ORDER — SODIUM CHLORIDE 0.9% FLUSH
10.0000 mL | Freq: Two times a day (BID) | INTRAVENOUS | Status: DC
  Filled 2022-12-28: qty 10

## 2022-12-28 NOTE — Progress Notes (Signed)
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  This was followed by a PET CT scan which showed extensive FDG avid adenopathy in the neck chest abdomen and pelvis.  FDG avid splenic lesions.  Solitary intramuscular FDG avid lesion in the right biceps femoris muscle.   Supraclavicular excisional lymph node biopsy showed high-grade B-cell lymphoma germinal center type Ki-67 greater than 95%.  FISH testing was positive for Bcl-2 BCL6 and MYC consistent with triple hit lymphoma.  By NCCN IPI score would be 4 based on age and elevated LDH and stage IV (intramuscular biceps femoris lesion) which puts him in the high intermediate risk group. 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   Patient received RCHOP for cycle 1 with plans for DA [facility] with cycle 2. IT MTX for CNS prophylaxis and he has received 3 cycles but declined further cycles.  MRI brain negative for lymphoma.  Chemotherapy was not being able to escalated upwards due to neutropenia which has persisted to 3 weeks and he has been receiving treatment every 4  weeks   PET CT scan after 3 cycles of chemotherapy showedComplete or near complete metabolic response.  Residual disease in the anterior upper right mediastinum slightly greater than background mediastinal activity   Patient found to have a clinically palpable right supraclavicular lymph node which was subsequently biopsied and was consistent with previously diagnosed lymphoma.  PET CT scan showedNew right level 5 adenopathy in the lower neck with dominant lymph node 1.6 cm Deauville 5.  No other findings of acute active lymphoma in chest abdomen pelvis and bones.  MRI brain negative for leptomeningeal disease.   Patient seen for second opinion by Dr. Aviva Kluver at [facility] for consideration of CAR-T cell therapy.  Patient did not wish to proceed with CAR-T cell treatment after reviewing risks versus benefits.  Moreover patient's health insurance will also not cover CAR-T cell therapy. Patient started on second line tafasitamab/Revlimid regimen in June 2023.  He is on Revlimid 15 mg 3 weeks on and 1 week off.  Disease progression after 3 cycles and patient was switched to [facility] regimen     Patient was noted to haveSignificant decrease in the size of supraclavicular lymph nodes consistent with treatment response Deauville 3 after 3 cycles of Pola BR chemotherapy.  Patient has only received 4 cycles of chemotherapy so far due to persistent neutropenia despite receiving growth factor support.  Repeat PET scan showed new/recurrent hypermetabolic subcutaneous tissue in the high right back/inferior neck consistent with recurrent lymphoma Deauville 5.  He received palliative radiation to that area.  Patient was started on Zynlonta in June 2024 after he was found to have recurrent  disease involving subcutaneous right scapular region      Interval history-patient has developed mild right knee swelling since his last treatment.  It does not bother him as much and has occasional soreness.  He is able to ambulate  well  ECOG PS- 0 Pain scale- 0 Opioid associated constipation- no  Review of systems- Review of Systems  Constitutional:  Negative for chills, fever, malaise/fatigue and weight loss.  HENT:  Negative for congestion, ear discharge and nosebleeds.   Eyes:  Negative for blurred vision.  Respiratory:  Negative for cough, hemoptysis, sputum production, shortness of breath and wheezing.   Cardiovascular:  Negative for chest pain, palpitations, orthopnea and claudication.  Gastrointestinal:  Negative for abdominal pain, blood in stool, constipation, diarrhea, heartburn, melena, nausea and vomiting.  Genitourinary:  Negative for dysuria, flank pain, frequency, hematuria and urgency.  Musculoskeletal:  Positive for joint pain. Negative for back pain and myalgias.  Skin:  Negative for rash.  Neurological:  Negative for dizziness, tingling, focal weakness, seizures, weakness and headaches.  Endo/Heme/Allergies:  Does not bruise/bleed easily.  Psychiatric/Behavioral:  Negative for depression and suicidal ideas. The patient does not have insomnia.       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     Past Surgical History:  Procedure Laterality Date   BONE MARROW BIOPSY     COLONOSCOPY     EXCISION MASS NECK Right 06/12/2020   Procedure: EXCISION MASS NECK;  Surgeon: Leafy Ro, MD;  Location: [facility] ORS;  Service: General;  Laterality: Right;   HERNIA REPAIR Right    at age 12-RIH   69 CATH INSERTION     PORTACATH PLACEMENT Right 06/24/2020   Procedure: INSERTION PORT-A-CATH;  Surgeon: Leafy Ro, MD;  Location: [facility] ORS;  Service: General;  Laterality: Right;    Social History   Socioeconomic History   Marital status: Single    Spouse name: Not on file   Number of children: Not on file   Years of education: Not on file   Highest  education level: Not on file  Occupational History   Not on file  Tobacco Use   Smoking status: Former    Current packs/day: 0.00    Types: Cigarettes    Quit date: 06/13/2020    Years since quitting: 2.5   Smokeless tobacco: Never  Vaping Use   Vaping status: Never Used  Substance and Sexual Activity   Alcohol use: Not Currently   Drug use: Not Currently    Types: Marijuana   Sexual activity: Not Currently  Other Topics Concern   Not on file  Social History Narrative   Has daughter and grandson in the home   Social Determinants of Health   Financial Resource Strain: Medium Risk (07/30/2021)   Received from [facility], [facility]   Overall Financial Resource Strain (CARDIA)    Difficulty of Paying Living Expenses: Somewhat hard  Food Insecurity: No Food Insecurity (07/30/2021)   Received from [facility], [facility]   Hunger Vital Sign    Worried About Running Out of Food in the Last Year: Never true    Ran Out of Food in the Last Year: Never true  Transportation Needs: No Transportation Needs (07/30/2021)   Received from [facility], [facility]   PRAPARE -  Administrator, Civil Service (Medical): No    Lack of Transportation (Non-Medical): No  Physical Activity: Not on file  Stress: No Stress Concern Present (07/30/2021)   Received from [facility], [facility] of Occupational Health - Occupational Stress Questionnaire    Feeling of Stress : Not at all  Social Connections: Not on file  Intimate Partner Violence: Not on file    Family History  Problem Relation Age of Onset   Anemia Mother    Hypertension Mother    Goiter Mother    Cancer Father    Cancer Sister    Multiple sclerosis Sister      Current Outpatient Medications:    aspirin EC 81 MG tablet, Take 1 tablet (81 mg total) by mouth daily. Swallow whole., Disp: 30 tablet, Rfl: 0   atenolol (TENORMIN) 25 MG tablet, Take 25 mg by mouth  daily., Disp: , Rfl:    cholecalciferol (VITAMIN D3) 25 MCG (1000 UNIT) tablet, Take 1,000 Units by mouth daily., Disp: , Rfl:    dexamethasone (DECADRON) 4 MG tablet, Take 4 mg tablet at  8 am and  4pm day before chemo, day 2 take 1 tablet at 4 pm only, day 3 take 1 tablet in am and 4 pm. Need to eat before taking med. Repeat the above every 3 weeks, Disp: 10 tablet, Rfl: 3   diphenhydrAMINE (BENADRYL) 2 % cream, Apply 1 Application topically 3 (three) times daily as needed for itching., Disp: , Rfl:    diphenhydrAMINE (BENADRYL) 25 mg capsule, Take 25 mg by mouth every 6 (six) hours as needed for itching or allergies., Disp: , Rfl:    doxycycline (VIBRA-TABS) 100 MG tablet, Take 1 tablet (100 mg total) by mouth 2 (two) times daily., Disp: 14 tablet, Rfl: 0   lisinopril (ZESTRIL) 10 MG tablet, Take 10 mg by mouth daily., Disp: , Rfl:    Omega-3 Fatty Acids (FISH OIL ADULT GUMMIES PO), Take by mouth., Disp: , Rfl:    ondansetron (ZOFRAN) 8 MG tablet, Take 1 tablet (8 mg total) by mouth every 8 (eight) hours as needed for nausea or vomiting. (Patient not taking: Reported on 12/07/2022), Disp: 30 tablet, Rfl: 1   potassium chloride SA (KLOR-CON M) 20 MEQ tablet, Take 1 tablet (20 mEq total) by mouth daily., Disp: 21 tablet, Rfl: 0   prochlorperazine (COMPAZINE) 10 MG tablet, Take 1 tablet (10 mg total) by mouth every 6 (six) hours as needed for nausea or vomiting., Disp: 30 tablet, Rfl: 1   Tenofovir Alafenamide Fumarate (VEMLIDY) 25 MG TABS, Take 1 tablet (25 mg total) by mouth daily. Take with food., Disp: 30 tablet, Rfl: 2   triamcinolone ointment (KENALOG) 0.5 %, Apply 1 Application topically 2 (two) times daily., Disp: 30 g, Rfl: 3   vitamin C (ASCORBIC ACID) 500 MG tablet, Take 500 mg by mouth daily., Disp: , Rfl:   Current Facility-Administered Medications:    sodium chloride flush (NS) 0.9 % injection 10 mL, 10 mL, Intravenous, Q12H, Creig Hines, MD  Facility-Administered Medications  Ordered in Other Visits:    0.9 %  sodium chloride infusion, , Intravenous, Continuous, Brahmanday, Worthy Flank, MD, Stopped at 09/11/20 1502   heparin lock flush 100 unit/mL, 500 Units, Intravenous, Once, Creig Hines, MD   heparin lock flush 100 unit/mL, 500 Units, Intracatheter, Once PRN, Creig Hines, MD   loncastuximab tesirine-lpyl (ZYNLONTA) 6.1 mg in dextrose 5 % 50 mL  chemo infusion, 0.075 mg/kg (Treatment Plan Recorded), Intravenous, Once, Creig Hines, MD   methotrexate (PF) 12 mg in sodium chloride (PF) 0.9 % INTRATHECAL chemo injection, , Intrathecal, Once, Creig Hines, MD  Physical exam:  Vitals:   12/28/22 0928  BP: 130/79  Pulse: 75  Resp: 18  Temp: (!) 95.4 F (35.2 C)  TempSrc: Tympanic  SpO2: 100%  Weight: 177 lb (80.3 kg)   Physical Exam Cardiovascular:     Rate and Rhythm: Normal rate and regular rhythm.     Heart sounds: Normal heart sounds.  Pulmonary:     Effort: Pulmonary effort is normal.     Breath sounds: Normal breath sounds.  Abdominal:     General: Bowel sounds are normal.     Palpations: Abdomen is soft.  Musculoskeletal:     Comments: There is mild diffuse swelling noted in the right knee without any crepitus or local warmth.  Skin:    General: Skin is warm and dry.  Neurological:     Mental Status: He is alert and oriented to person, place, and time.         Latest Ref Rng & Units 12/28/2022    8:58 AM  CMP  Glucose 70 - 99 mg/dL 86   BUN 8 - 23 mg/dL 9   Creatinine 2.84 - 1.32 mg/dL 4.40   Sodium 102 - 725 mmol/L 137   Potassium 3.5 - 5.1 mmol/L 3.5   Chloride 98 - 111 mmol/L 106   CO2 22 - 32 mmol/L 25   Calcium 8.9 - 10.3 mg/dL 8.6   Total Protein 6.5 - 8.1 g/dL 6.2   Total Bilirubin 0.3 - 1.2 mg/dL 0.4   Alkaline Phos 38 - 126 U/L 92   AST 15 - 41 U/L 36   ALT 0 - 44 U/L 18       Latest Ref Rng & Units 12/28/2022    8:58 AM  CBC  WBC 4.0 - 10.5 K/uL 4.6   Hemoglobin 13.0 - 17.0 g/dL 36.6   Hematocrit 44.0 -  52.0 % 35.1   Platelets 150 - 400 K/uL 162     Assessment and plan- Patient is a 62 y.o. 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  He is here for on treatment assessment prior to cycle 7 of Zynlonta  Counts okay to proceed with cycle 7 of Zynlonta today.  I will see him back in 3 weeks for cycle 8.  Plan to repeat scans after 2 cycles.  If PET shows that he is continuing to be in remission I will consider giving him a break from treatments at that time.  Right knee pain: There is mild diffuse swelling noted in the right knee joint but it does not appear to be acutely septic or infected.  Continue to monitor   Visit Diagnosis 1. High grade B-cell lymphoma (HCC)   2. Encounter for antineoplastic chemotherapy      Dr. Owens Shark, MD, MPH [facility] at [facility] [phone] 12/28/2022 12:36 PM

## 2022-12-29 ENCOUNTER — Other Ambulatory Visit: Payer: Self-pay

## 2022-12-30 ENCOUNTER — Inpatient Hospital Stay: Payer: Medicaid Other

## 2022-12-30 DIAGNOSIS — C8510 Unspecified B-cell lymphoma, unspecified site: Secondary | ICD-10-CM

## 2022-12-30 DIAGNOSIS — Z5112 Encounter for antineoplastic immunotherapy: Secondary | ICD-10-CM | POA: Diagnosis not present

## 2022-12-30 MED ORDER — PEGFILGRASTIM-CBQV 6 MG/0.6ML ~~LOC~~ SOSY
6.0000 mg | PREFILLED_SYRINGE | Freq: Once | SUBCUTANEOUS | Status: AC
  Administered 2022-12-30: 6 mg via SUBCUTANEOUS
  Filled 2022-12-30: qty 0.6

## 2022-12-31 ENCOUNTER — Other Ambulatory Visit: Payer: Self-pay

## 2022-12-31 ENCOUNTER — Other Ambulatory Visit: Payer: Self-pay | Admitting: *Deleted

## 2022-12-31 MED ORDER — PREDNISONE 10 MG PO TABS
ORAL_TABLET | ORAL | 0 refills | Status: DC
  Filled 2022-12-31: qty 16, 6d supply, fill #0

## 2022-12-31 NOTE — Telephone Encounter (Signed)
Please send prednisone taper startign at 50 mg and taper by 10 mg ending at 5 mg in 6 days. Call if symptoms are no better

## 2022-12-31 NOTE — Telephone Encounter (Signed)
Patient called reporting that he is having swelling in his knees bilateral but right worse than left and the swelling is only in the knee not the rest of his leg. He state that getting up and moving around has helped it go down some, burt not completely. IT is painful and makes it difficult to move around. He is asking for some medicine for it. He notes that this is always worse after he has a treatment. Please advise

## 2023-01-12 ENCOUNTER — Other Ambulatory Visit: Payer: Self-pay

## 2023-01-13 NOTE — Progress Notes (Signed)
The following biosimilar Fulphila ([id number]) has been selected for use in this patient due to shortage of Hendricks Limes, PharmD, BCPS Clinical Pharmacist

## 2023-01-14 ENCOUNTER — Other Ambulatory Visit: Payer: Self-pay | Admitting: Oncology

## 2023-01-18 ENCOUNTER — Inpatient Hospital Stay: Payer: Medicaid Other

## 2023-01-18 ENCOUNTER — Encounter: Payer: Self-pay | Admitting: Oncology

## 2023-01-18 ENCOUNTER — Other Ambulatory Visit: Payer: Self-pay | Admitting: *Deleted

## 2023-01-18 ENCOUNTER — Inpatient Hospital Stay: Payer: Medicaid Other | Attending: Oncology

## 2023-01-18 ENCOUNTER — Other Ambulatory Visit: Payer: Self-pay

## 2023-01-18 ENCOUNTER — Inpatient Hospital Stay (HOSPITAL_BASED_OUTPATIENT_CLINIC_OR_DEPARTMENT_OTHER): Payer: Medicaid Other | Admitting: Oncology

## 2023-01-18 VITALS — BP 134/75 | HR 77 | Resp 18

## 2023-01-18 VITALS — BP 118/73 | HR 83 | Temp 97.6°F | Resp 18 | Wt 179.5 lb

## 2023-01-18 DIAGNOSIS — Z87891 Personal history of nicotine dependence: Secondary | ICD-10-CM | POA: Insufficient documentation

## 2023-01-18 DIAGNOSIS — M25562 Pain in left knee: Secondary | ICD-10-CM | POA: Diagnosis not present

## 2023-01-18 DIAGNOSIS — C8338 Diffuse large B-cell lymphoma, lymph nodes of multiple sites: Secondary | ICD-10-CM | POA: Diagnosis present

## 2023-01-18 DIAGNOSIS — Z5111 Encounter for antineoplastic chemotherapy: Secondary | ICD-10-CM | POA: Diagnosis not present

## 2023-01-18 DIAGNOSIS — Z8249 Family history of ischemic heart disease and other diseases of the circulatory system: Secondary | ICD-10-CM | POA: Diagnosis not present

## 2023-01-18 DIAGNOSIS — Z79899 Other long term (current) drug therapy: Secondary | ICD-10-CM | POA: Insufficient documentation

## 2023-01-18 DIAGNOSIS — Z832 Family history of diseases of the blood and blood-forming organs and certain disorders involving the immune mechanism: Secondary | ICD-10-CM | POA: Insufficient documentation

## 2023-01-18 DIAGNOSIS — C8510 Unspecified B-cell lymphoma, unspecified site: Secondary | ICD-10-CM

## 2023-01-18 DIAGNOSIS — Z83438 Family history of other disorder of lipoprotein metabolism and other lipidemia: Secondary | ICD-10-CM | POA: Insufficient documentation

## 2023-01-18 DIAGNOSIS — Z7961 Long term (current) use of immunomodulator: Secondary | ICD-10-CM | POA: Insufficient documentation

## 2023-01-18 DIAGNOSIS — Z8269 Family history of other diseases of the musculoskeletal system and connective tissue: Secondary | ICD-10-CM | POA: Diagnosis not present

## 2023-01-18 DIAGNOSIS — Z79624 Long term (current) use of inhibitors of nucleotide synthesis: Secondary | ICD-10-CM | POA: Insufficient documentation

## 2023-01-18 DIAGNOSIS — Z5112 Encounter for antineoplastic immunotherapy: Secondary | ICD-10-CM | POA: Diagnosis present

## 2023-01-18 DIAGNOSIS — Z79631 Long term (current) use of antimetabolite agent: Secondary | ICD-10-CM | POA: Diagnosis not present

## 2023-01-18 DIAGNOSIS — Z809 Family history of malignant neoplasm, unspecified: Secondary | ICD-10-CM | POA: Insufficient documentation

## 2023-01-18 DIAGNOSIS — Z7952 Long term (current) use of systemic steroids: Secondary | ICD-10-CM | POA: Diagnosis not present

## 2023-01-18 DIAGNOSIS — M25561 Pain in right knee: Secondary | ICD-10-CM | POA: Diagnosis not present

## 2023-01-18 LAB — CBC WITH DIFFERENTIAL (CANCER CENTER ONLY)
Abs Immature Granulocytes: 0.06 10*3/uL (ref 0.00–0.07)
Basophils Absolute: 0 10*3/uL (ref 0.0–0.1)
Basophils Relative: 1 %
Eosinophils Absolute: 0.1 10*3/uL (ref 0.0–0.5)
Eosinophils Relative: 1 %
HCT: 32.5 % — ABNORMAL LOW (ref 39.0–52.0)
Hemoglobin: 11.1 g/dL — ABNORMAL LOW (ref 13.0–17.0)
Immature Granulocytes: 2 %
Lymphocytes Relative: 29 %
Lymphs Abs: 1.1 10*3/uL (ref 0.7–4.0)
MCH: 32.5 pg (ref 26.0–34.0)
MCHC: 34.2 g/dL (ref 30.0–36.0)
MCV: 95 fL (ref 80.0–100.0)
Monocytes Absolute: 0.5 10*3/uL (ref 0.1–1.0)
Monocytes Relative: 12 %
Neutro Abs: 2.2 10*3/uL (ref 1.7–7.7)
Neutrophils Relative %: 55 %
Platelet Count: 170 10*3/uL (ref 150–400)
RBC: 3.42 MIL/uL — ABNORMAL LOW (ref 4.22–5.81)
RDW: 13.7 % (ref 11.5–15.5)
WBC Count: 3.9 10*3/uL — ABNORMAL LOW (ref 4.0–10.5)
nRBC: 0 % (ref 0.0–0.2)

## 2023-01-18 LAB — CMP (CANCER CENTER ONLY)
ALT: 20 U/L (ref 0–44)
AST: 34 U/L (ref 15–41)
Albumin: 3.6 g/dL (ref 3.5–5.0)
Alkaline Phosphatase: 102 U/L (ref 38–126)
Anion gap: 7 (ref 5–15)
BUN: 10 mg/dL (ref 8–23)
CO2: 25 mmol/L (ref 22–32)
Calcium: 8.9 mg/dL (ref 8.9–10.3)
Chloride: 102 mmol/L (ref 98–111)
Creatinine: 0.87 mg/dL (ref 0.61–1.24)
GFR, Estimated: >60 mL/min (ref >60)
Glucose, Bld: 117 mg/dL — ABNORMAL HIGH (ref 70–99)
Potassium: 3.7 mmol/L (ref 3.5–5.1)
Sodium: 134 mmol/L — ABNORMAL LOW (ref 135–145)
Total Bilirubin: 0.5 mg/dL (ref <1.2)
Total Protein: 6.4 g/dL — ABNORMAL LOW (ref 6.5–8.1)

## 2023-01-18 MED ORDER — DEXTROSE 5 % IV SOLN
Freq: Once | INTRAVENOUS | Status: AC
Start: 2023-01-18 — End: 2023-01-18
  Filled 2023-01-18: qty 250

## 2023-01-18 MED ORDER — SODIUM CHLORIDE 0.9% FLUSH
10.0000 mL | INTRAVENOUS | Status: AC | PRN
  Administered 2023-01-18: 10 mL
  Filled 2023-01-18: qty 10

## 2023-01-18 MED ORDER — HEPARIN SOD (PORK) LOCK FLUSH 100 UNIT/ML IV SOLN
500.0000 [IU] | Freq: Once | INTRAVENOUS | Status: AC | PRN
  Administered 2023-01-18: 500 [IU]
  Filled 2023-01-18: qty 5

## 2023-01-18 MED ORDER — DEXAMETHASONE SODIUM PHOSPHATE 10 MG/ML IJ SOLN
10.0000 mg | Freq: Once | INTRAMUSCULAR | Status: AC
  Administered 2023-01-18: 10 mg via INTRAVENOUS
  Filled 2023-01-18: qty 1

## 2023-01-18 MED ORDER — LONCASTUXIMAB TESIRINE-LPYL CHEMO INJECTION 10 MG
0.0750 mg/kg | Freq: Once | INTRAVENOUS | Status: AC
  Administered 2023-01-18: 6.1 mg via INTRAVENOUS
  Filled 2023-01-18: qty 1.22

## 2023-01-18 MED ORDER — PREDNISONE 10 MG PO TABS
ORAL_TABLET | ORAL | 0 refills | Status: DC
  Filled 2023-01-18: qty 16, 6d supply, fill #0

## 2023-01-18 NOTE — Addendum Note (Signed)
Addended by: Storm Frisk on: 01/18/2023 03:55 PM   Modules accepted: Orders

## 2023-01-18 NOTE — Patient Instructions (Signed)
[facility] - A DEPT OF [facility]  Discharge Instructions: Thank you for choosing [facility] to provide your oncology and hematology care.  If you have a lab appointment with the Cancer Center, please go directly to the Cancer Center and check in at the registration area.  Wear comfortable clothing and clothing appropriate for easy access to any Portacath or PICC line.   We strive to give you quality time with your provider. You may need to reschedule your appointment if you arrive late (15 or more minutes).  Arriving late affects you and other patients whose appointments are after yours.  Also, if you miss three or more appointments without notifying the office, you may be dismissed from the clinic at the provider's discretion.      For prescription refill requests, have your pharmacy contact our office and allow 72 hours for refills to be completed.    Today you received the following chemotherapy and/or immunotherapy agents zynlonta      To help prevent nausea and vomiting after your treatment, we encourage you to take your nausea medication as directed.  BELOW ARE SYMPTOMS THAT SHOULD BE REPORTED IMMEDIATELY: *FEVER GREATER THAN 100.4 F (38 C) OR HIGHER *CHILLS OR SWEATING *NAUSEA AND VOMITING THAT IS NOT CONTROLLED WITH YOUR NAUSEA MEDICATION *UNUSUAL SHORTNESS OF BREATH *UNUSUAL BRUISING OR BLEEDING *URINARY PROBLEMS (pain or burning when urinating, or frequent urination) *BOWEL PROBLEMS (unusual diarrhea, constipation, pain near the anus) TENDERNESS IN MOUTH AND THROAT WITH OR WITHOUT PRESENCE OF ULCERS (sore throat, sores in mouth, or a toothache) UNUSUAL RASH, SWELLING OR PAIN  UNUSUAL VAGINAL DISCHARGE OR ITCHING   Items with * indicate a potential emergency and should be followed up as soon as possible or go to the Emergency Department if any problems should occur.  Please show the CHEMOTHERAPY ALERT CARD or IMMUNOTHERAPY  ALERT CARD at check-in to the Emergency Department and triage nurse.  Should you have questions after your visit or need to cancel or reschedule your appointment, please contact [place] [facility] - A DEPT OF [facility]  [phone] and follow the prompts.  Office hours are 8:00 a.m. to 4:30 p.m. Monday - Friday. Please note that voicemails left after 4:00 p.m. may not be returned until the following business day.  We are closed weekends and major holidays. You have access to a nurse at all times for urgent questions. Please call the main number to the clinic [phone] and follow the prompts.  For any non-urgent questions, you may also contact your provider using MyChart. We now offer e-Visits for anyone 62 and older to request care online for non-urgent symptoms. For details visit [email or web address].   Also download the MyChart app! Go to the app store, search "MyChart", open the app, select , and log in with your MyChart username and password.

## 2023-01-18 NOTE — Progress Notes (Signed)
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  This was followed by a PET CT scan which showed extensive FDG avid adenopathy in the neck chest abdomen and pelvis.  FDG avid splenic lesions.  Solitary intramuscular FDG avid lesion in the right biceps femoris muscle.   Supraclavicular excisional lymph node biopsy showed high-grade B-cell lymphoma germinal center type Ki-67 greater than 95%.  FISH testing was positive for Bcl-2 BCL6 and MYC consistent with triple hit lymphoma.  By NCCN IPI score would be 4 based on age and elevated LDH and stage IV (intramuscular biceps femoris lesion) which puts him in the high intermediate risk group. 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   Patient received RCHOP for cycle 1 with plans for [facility] with cycle 2. IT MTX for CNS prophylaxis and he has received 3 cycles but declined further cycles.  MRI brain negative for lymphoma.  Chemotherapy was not being able to escalated upwards due to neutropenia which has persisted to 3 weeks and he has been receiving treatment every 4  weeks   PET CT scan after 3 cycles of chemotherapy showedComplete or near complete metabolic response.  Residual disease in the anterior upper right mediastinum slightly greater than background mediastinal activity   Patient found to have a clinically palpable right supraclavicular lymph node which was subsequently biopsied and was consistent with previously diagnosed lymphoma.  PET CT scan showedNew right level 5 adenopathy in the lower neck with dominant lymph node 1.6 cm Deauville 5.  No other findings of acute active lymphoma in chest abdomen pelvis and bones.  MRI brain negative for leptomeningeal disease.   Patient seen for second opinion by Dr. Aviva Kluver at [facility] for consideration of CAR-T cell therapy.  Patient did not wish to proceed with CAR-T cell treatment after reviewing risks versus benefits.  Moreover patient's health insurance will also not cover CAR-T cell therapy. Patient started on second line tafasitamab/Revlimid regimen in June 2023.  He is on Revlimid 15 mg 3 weeks on and 1 week off.  Disease progression after 3 cycles and patient was switched to [facility] regimen     Patient was noted to haveSignificant decrease in the size of supraclavicular lymph nodes consistent with treatment response Deauville 3 after 3 cycles of Pola BR chemotherapy.  Patient has only received 4 cycles of chemotherapy so far due to persistent neutropenia despite receiving growth factor support.  Repeat PET scan showed new/recurrent hypermetabolic subcutaneous tissue in the high right back/inferior neck consistent with recurrent lymphoma Deauville 5.  He received palliative radiation to that area.  Patient was started on Zynlonta in June 2024 after he was found to have recurrent disease involving  subcutaneous right scapular region  Interval history-tolerating Zynlonta well without any significant side effects.  He occasionally gets bilateral knee pain following an effusion.  He did not require any steroids with his  last treatment  ECOG PS- 0 Pain scale- 0   Review of systems- Review of Systems  Constitutional:  Negative for chills, fever, malaise/fatigue and weight loss.  HENT:  Negative for congestion, ear discharge and nosebleeds.   Eyes:  Negative for blurred vision.  Respiratory:  Negative for cough, hemoptysis, sputum production, shortness of breath and wheezing.   Cardiovascular:  Negative for chest pain, palpitations, orthopnea and claudication.  Gastrointestinal:  Negative for abdominal pain, blood in stool, constipation, diarrhea, heartburn, melena, nausea and vomiting.  Genitourinary:  Negative for dysuria, flank pain, frequency, hematuria and urgency.  Musculoskeletal:  Negative for back pain, joint pain and myalgias.  Skin:  Negative for rash.  Neurological:  Negative for dizziness, tingling, focal weakness, seizures, weakness and headaches.  Endo/Heme/Allergies:  Does not bruise/bleed easily.  Psychiatric/Behavioral:  Negative for depression and suicidal ideas. The patient does not have insomnia.       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     Past Surgical History:  Procedure Laterality Date   BONE MARROW BIOPSY     COLONOSCOPY     EXCISION MASS NECK Right 06/12/2020   Procedure: EXCISION MASS NECK;  Surgeon: Leafy Ro, MD;  Location: [facility] ORS;  Service: General;  Laterality: Right;   HERNIA REPAIR Right    at age 24-RIH   35 CATH INSERTION     PORTACATH PLACEMENT Right 06/24/2020   Procedure: INSERTION PORT-A-CATH;  Surgeon: Leafy Ro, MD;  Location: [facility] ORS;  Service: General;  Laterality: Right;    Social History   Socioeconomic History   Marital status: Single    Spouse name: Not on file   Number of children: Not on file   Years of education: Not on file   Highest education level: Not on file  Occupational  History   Not on file  Tobacco Use   Smoking status: Former    Current packs/day: 0.00    Types: Cigarettes    Quit date: 06/13/2020    Years since quitting: 2.6   Smokeless tobacco: Never  Vaping Use   Vaping status: Never Used  Substance and Sexual Activity   Alcohol use: Not Currently   Drug use: Not Currently    Types: Marijuana   Sexual activity: Not Currently  Other Topics Concern   Not on file  Social History Narrative   Has daughter and grandson in the home   Social Determinants of Health   Financial Resource Strain: Medium Risk (07/30/2021)   Received [facility], [facility]   Overall Financial Resource Strain (CARDIA)    Difficulty of Paying Living Expenses: Somewhat hard  Food Insecurity: No Food Insecurity (07/30/2021)   Received from [facility], [facility]   Hunger Vital Sign    Worried About Running Out of Food in the Last Year: Never true    Ran Out of Food in the Last Year: Never true  Transportation Needs: No Transportation Needs (07/30/2021)   Received from [facility], [facility]   [facility] - Transportation    Lack of Transportation (Medical): No  Lack of Transportation (Non-Medical): No  Physical Activity: Not on file  Stress: No Stress Concern Present (07/30/2021)   Received from [facility], [facility] - Occupational Stress Questionnaire    Feeling of Stress : Not at all  Social Connections: Not on file  Intimate Partner Violence: Not on file    Family History  Problem Relation Age of Onset   Anemia Mother    Hypertension Mother    Goiter Mother    Cancer Father    Cancer Sister    Multiple sclerosis Sister      Current Outpatient Medications:    aspirin EC 81 MG tablet, Take 1 tablet (81 mg total) by mouth daily. Swallow whole., Disp: 30 tablet, Rfl: 0   atenolol (TENORMIN) 25 MG tablet, Take 25 mg by mouth daily., Disp: , Rfl:    cholecalciferol (VITAMIN  D3) 25 MCG (1000 UNIT) tablet, Take 1,000 Units by mouth daily., Disp: , Rfl:    dexamethasone (DECADRON) 4 MG tablet, Take 4 mg tablet at  8 am and  4pm day before chemo, day 2 take 1 tablet at 4 pm only, day 3 take 1 tablet in am and 4 pm. Need to eat before taking med. Repeat the above every 3 weeks, Disp: 10 tablet, Rfl: 3   diphenhydrAMINE (BENADRYL) 2 % cream, Apply 1 Application topically 3 (three) times daily as needed for itching., Disp: , Rfl:    diphenhydrAMINE (BENADRYL) 25 mg capsule, Take 25 mg by mouth every 6 (six) hours as needed for itching or allergies., Disp: , Rfl:    doxycycline (VIBRA-TABS) 100 MG tablet, Take 1 tablet (100 mg total) by mouth 2 (two) times daily., Disp: 14 tablet, Rfl: 0   lisinopril (ZESTRIL) 10 MG tablet, Take 10 mg by mouth daily., Disp: , Rfl:    Omega-3 Fatty Acids (FISH OIL ADULT GUMMIES PO), Take by mouth., Disp: , Rfl:    ondansetron (ZOFRAN) 8 MG tablet, Take 1 tablet (8 mg total) by mouth every 8 (eight) hours as needed for nausea or vomiting. (Patient not taking: Reported on 12/07/2022), Disp: 30 tablet, Rfl: 1   potassium chloride SA (KLOR-CON M) 20 MEQ tablet, Take 1 tablet (20 mEq total) by mouth daily., Disp: 21 tablet, Rfl: 0   predniSONE (DELTASONE) 10 MG tablet, Take 5 tablets (50 mg) by mouth once daily on Day 1. Then take 4 tablets (40 mg) on Day 2, then take 3 tablets (30 mg) on Day 3, then take 2 tablets (20 mg) on Day 4, then take 1 tablet (10 mg) on Day 5, then take 1/2 tablet (5 mg) on Day 6, then stop., Disp: 16 tablet, Rfl: 0   prochlorperazine (COMPAZINE) 10 MG tablet, Take 1 tablet (10 mg total) by mouth every 6 (six) hours as needed for nausea or vomiting., Disp: 30 tablet, Rfl: 1   Tenofovir Alafenamide Fumarate (VEMLIDY) 25 MG TABS, Take 1 tablet (25 mg total) by mouth daily. Take with food., Disp: 30 tablet, Rfl: 2   triamcinolone ointment (KENALOG) 0.5 %, Apply 1 Application topically 2 (two) times daily., Disp: 30 g, Rfl: 3    vitamin C (ASCORBIC ACID) 500 MG tablet, Take 500 mg by mouth daily., Disp: , Rfl:  No current facility-administered medications for this visit.  Facility-Administered Medications Ordered in Other Visits:    0.9 %  sodium chloride infusion, , Intravenous, Continuous, Earna Coder, MD, Stopped at 09/11/20 1502  heparin lock flush 100 unit/mL, 500 Units, Intravenous, Once, Creig Hines, MD   methotrexate (PF) 12 mg in sodium chloride (PF) 0.9 % INTRATHECAL chemo injection, , Intrathecal, Once, Creig Hines, MD   sodium chloride flush (NS) 0.9 % injection 10 mL, 10 mL, Intracatheter, PRN, Creig Hines, MD, 10 mL at 01/18/23 1545  Physical exam:  Vitals:   01/18/23 1321  BP: 118/73  Pulse: 83  Resp: 18  Temp: 97.6 F (36.4 C)  TempSrc: Tympanic  SpO2: 100%  Weight: 179 lb 8 oz (81.4 kg)   Physical Exam Cardiovascular:     Rate and Rhythm: Normal rate and regular rhythm.     Heart sounds: Normal heart sounds.  Pulmonary:     Effort: Pulmonary effort is normal.     Breath sounds: Normal breath sounds.  Abdominal:     General: Bowel sounds are normal.     Palpations: Abdomen is soft.  Skin:    General: Skin is warm and dry.  Neurological:     Mental Status: He is alert and oriented to person, place, and time.         Latest Ref Rng & Units 01/18/2023    1:00 PM  CMP  Glucose 70 - 99 mg/dL 161   BUN 8 - 23 mg/dL 10   Creatinine 0.96 - 1.24 mg/dL 0.45   Sodium 409 - 811 mmol/L 134   Potassium 3.5 - 5.1 mmol/L 3.7   Chloride 98 - 111 mmol/L 102   CO2 22 - 32 mmol/L 25   Calcium 8.9 - 10.3 mg/dL 8.9   Total Protein 6.5 - 8.1 g/dL 6.4   Total Bilirubin <9.1 mg/dL 0.5   Alkaline Phos 38 - 126 U/L 102   AST 15 - 41 U/L 34   ALT 0 - 44 U/L 20       Latest Ref Rng & Units 01/18/2023    1:00 PM  CBC  WBC 4.0 - 10.5 K/uL 3.9   Hemoglobin 13.0 - 17.0 g/dL 47.8   Hematocrit 29.5 - 52.0 % 32.5   Platelets 150 - 400 K/uL 170      Assessment and plan- Patient  is a 62 y.o. 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  He is here for on treatment assessment prior to cycle 8 of Zynlonta  Counts okay to proceed with cycle 8 of Zynlonta today.  I will see him back in 3 weeks for cycle 9.  Plan to repeat PET scan in about 4 to 5 weeks time.  If PET scan does not show any evidence of active lymphoma we will consider giving him a break from [place] at that time.  Joint pain: Usually symptoms get worse after Zynlonta infusion.  He has received steroids in the past short course of which has helped him.  Continue to monitor   Visit Diagnosis 1. High grade B-cell lymphoma (HCC)   2. Encounter for antineoplastic chemotherapy      Dr. Owens Shark, MD, MPH [facility] [phone] 01/18/2023 4:03 PM

## 2023-01-19 NOTE — Progress Notes (Signed)
The following biosimilar Stimufend (pegfilgrastim-fpgk) has been selected for use in this patient. Fulphila is on shortage at this time. Pt insur does not req auth.  Ebony Hail, Pharm.D., CPP 01/19/2023@4 :03 PM

## 2023-01-20 ENCOUNTER — Inpatient Hospital Stay: Payer: Medicaid Other

## 2023-01-20 ENCOUNTER — Other Ambulatory Visit: Payer: Self-pay

## 2023-01-20 DIAGNOSIS — C8510 Unspecified B-cell lymphoma, unspecified site: Secondary | ICD-10-CM

## 2023-01-20 DIAGNOSIS — Z5112 Encounter for antineoplastic immunotherapy: Secondary | ICD-10-CM | POA: Diagnosis not present

## 2023-01-20 MED ORDER — PEGFILGRASTIM-FPGK 6 MG/0.6ML ~~LOC~~ SOSY
6.0000 mg | PREFILLED_SYRINGE | Freq: Once | SUBCUTANEOUS | Status: AC
  Administered 2023-01-20: 6 mg via SUBCUTANEOUS
  Filled 2023-01-20: qty 0.6

## 2023-02-08 ENCOUNTER — Encounter: Payer: Self-pay | Admitting: Oncology

## 2023-02-08 ENCOUNTER — Inpatient Hospital Stay: Payer: Medicaid Other

## 2023-02-08 ENCOUNTER — Inpatient Hospital Stay (HOSPITAL_BASED_OUTPATIENT_CLINIC_OR_DEPARTMENT_OTHER): Payer: Medicaid Other | Admitting: Oncology

## 2023-02-08 VITALS — BP 140/80 | HR 73 | Temp 97.0°F | Resp 18 | Wt 179.2 lb

## 2023-02-08 VITALS — BP 150/86 | HR 71 | Resp 18

## 2023-02-08 DIAGNOSIS — Z5111 Encounter for antineoplastic chemotherapy: Secondary | ICD-10-CM | POA: Diagnosis not present

## 2023-02-08 DIAGNOSIS — Z95828 Presence of other vascular implants and grafts: Secondary | ICD-10-CM

## 2023-02-08 DIAGNOSIS — C8510 Unspecified B-cell lymphoma, unspecified site: Secondary | ICD-10-CM | POA: Diagnosis not present

## 2023-02-08 DIAGNOSIS — Z5112 Encounter for antineoplastic immunotherapy: Secondary | ICD-10-CM | POA: Diagnosis not present

## 2023-02-08 LAB — CBC WITH DIFFERENTIAL (CANCER CENTER ONLY)
Abs Immature Granulocytes: 0.06 10*3/uL (ref 0.00–0.07)
Basophils Absolute: 0 10*3/uL (ref 0.0–0.1)
Basophils Relative: 1 %
Eosinophils Absolute: 0 10*3/uL (ref 0.0–0.5)
Eosinophils Relative: 1 %
HCT: 34.6 % — ABNORMAL LOW (ref 39.0–52.0)
Hemoglobin: 11.5 g/dL — ABNORMAL LOW (ref 13.0–17.0)
Immature Granulocytes: 2 %
Lymphocytes Relative: 28 %
Lymphs Abs: 1.1 10*3/uL (ref 0.7–4.0)
MCH: 31.9 pg (ref 26.0–34.0)
MCHC: 33.2 g/dL (ref 30.0–36.0)
MCV: 96.1 fL (ref 80.0–100.0)
Monocytes Absolute: 0.4 10*3/uL (ref 0.1–1.0)
Monocytes Relative: 10 %
Neutro Abs: 2.3 10*3/uL (ref 1.7–7.7)
Neutrophils Relative %: 58 %
Platelet Count: 179 10*3/uL (ref 150–400)
RBC: 3.6 MIL/uL — ABNORMAL LOW (ref 4.22–5.81)
RDW: 13.9 % (ref 11.5–15.5)
WBC Count: 3.8 10*3/uL — ABNORMAL LOW (ref 4.0–10.5)
nRBC: 0 % (ref 0.0–0.2)

## 2023-02-08 LAB — CMP (CANCER CENTER ONLY)
ALT: 19 U/L (ref 0–44)
AST: 39 U/L (ref 15–41)
Albumin: 3.5 g/dL (ref 3.5–5.0)
Alkaline Phosphatase: 111 U/L (ref 38–126)
Anion gap: 10 (ref 5–15)
BUN: 9 mg/dL (ref 8–23)
CO2: 25 mmol/L (ref 22–32)
Calcium: 9.2 mg/dL (ref 8.9–10.3)
Chloride: 103 mmol/L (ref 98–111)
Creatinine: 0.78 mg/dL (ref 0.61–1.24)
GFR, Estimated: >60 mL/min (ref >60)
Glucose, Bld: 108 mg/dL — ABNORMAL HIGH (ref 70–99)
Potassium: 3.7 mmol/L (ref 3.5–5.1)
Sodium: 138 mmol/L (ref 135–145)
Total Bilirubin: 0.5 mg/dL (ref <1.2)
Total Protein: 6.6 g/dL (ref 6.5–8.1)

## 2023-02-08 MED ORDER — DEXAMETHASONE SODIUM PHOSPHATE 10 MG/ML IJ SOLN
10.0000 mg | Freq: Once | INTRAMUSCULAR | Status: AC
  Administered 2023-02-08: 10 mg via INTRAVENOUS
  Filled 2023-02-08: qty 1

## 2023-02-08 MED ORDER — LONCASTUXIMAB TESIRINE-LPYL CHEMO INJECTION 10 MG
0.0750 mg/kg | Freq: Once | INTRAVENOUS | Status: AC
  Administered 2023-02-08: 6.1 mg via INTRAVENOUS
  Filled 2023-02-08: qty 1.22

## 2023-02-08 MED ORDER — SODIUM CHLORIDE 0.9% FLUSH
10.0000 mL | INTRAVENOUS | Status: DC | PRN
  Administered 2023-02-08: 10 mL via INTRAVENOUS
  Filled 2023-02-08: qty 10

## 2023-02-08 MED ORDER — DEXTROSE 5 % IV SOLN
Freq: Once | INTRAVENOUS | Status: AC
Start: 2023-02-08 — End: 2023-02-08
  Filled 2023-02-08: qty 250

## 2023-02-08 MED ORDER — HEPARIN SOD (PORK) LOCK FLUSH 100 UNIT/ML IV SOLN
500.0000 [IU] | Freq: Once | INTRAVENOUS | Status: AC | PRN
  Administered 2023-02-08: 500 [IU]
  Filled 2023-02-08: qty 5

## 2023-02-08 MED ORDER — HEPARIN SOD (PORK) LOCK FLUSH 100 UNIT/ML IV SOLN
500.0000 [IU] | Freq: Once | INTRAVENOUS | Status: DC
Start: 2023-02-08 — End: 2023-02-08
  Filled 2023-02-08: qty 5

## 2023-02-08 NOTE — Progress Notes (Signed)
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  This was followed by a PET CT scan which showed extensive FDG avid adenopathy in the neck chest abdomen and pelvis.  FDG avid splenic lesions.  Solitary intramuscular FDG avid lesion in the right biceps femoris muscle.   Supraclavicular excisional lymph node biopsy showed high-grade B-cell lymphoma germinal center type Ki-67 greater than 95%.  FISH testing was positive for Bcl-2 BCL6 and MYC consistent with triple hit lymphoma.  By NCCN IPI score would be 4 based on age and elevated LDH and stage IV (intramuscular biceps femoris lesion) which puts him in the high intermediate risk group. 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   Patient received RCHOP for cycle 1 with plans for DA [facility] with cycle 2. IT MTX for CNS prophylaxis and he has received 3 cycles but declined further cycles.  MRI brain negative for lymphoma.  Chemotherapy was not being able to escalated upwards due to neutropenia which has persisted to 3 weeks and he has been receiving treatment every 4  weeks   PET CT scan after 3 cycles of chemotherapy showedComplete or near complete metabolic response.  Residual disease in the anterior upper right mediastinum slightly greater than background mediastinal activity   Patient found to have a clinically palpable right supraclavicular lymph node which was subsequently biopsied and was consistent with previously diagnosed lymphoma.  PET CT scan showedNew right level 5 adenopathy in the lower neck with dominant lymph node 1.6 cm Deauville 5.  No other findings of acute active lymphoma in chest abdomen pelvis and bones.  MRI brain negative for leptomeningeal disease.   Patient seen for second opinion by Dr. Aviva Kluver at [facility] for consideration of CAR-T cell therapy.  Patient did not wish to proceed with CAR-T cell treatment after reviewing risks versus benefits.  Moreover patient's health insurance will also not cover CAR-T cell therapy. Patient started on second line tafasitamab/Revlimid regimen in June 2023.  He is on Revlimid 15 mg 3 weeks on and 1 week off.  Disease progression after 3 cycles and patient was switched to [facility] regimen     Patient was noted to haveSignificant decrease in the size of supraclavicular lymph nodes consistent with treatment response Deauville 3 after 3 cycles of Pola BR chemotherapy.  Patient has only received 4 cycles of chemotherapy so far due to persistent neutropenia despite receiving growth factor support.  Repeat PET scan showed new/recurrent hypermetabolic subcutaneous tissue in the high right back/inferior neck consistent with recurrent lymphoma Deauville 5.  He received palliative radiation to that area.  Patient was started on Zynlonta in June 2024 after he was found to have recurrent disease  involving subcutaneous right scapular region    Interval history-he is doing well presently and denies any specific complaints at this time  ECOG PS- 0 Pain scale- 0   Review of systems- Review of Systems  Constitutional:   Negative for chills, fever, malaise/fatigue and weight loss.  HENT:  Negative for congestion, ear discharge and nosebleeds.   Eyes:  Negative for blurred vision.  Respiratory:  Negative for cough, hemoptysis, sputum production, shortness of breath and wheezing.   Cardiovascular:  Negative for chest pain, palpitations, orthopnea and claudication.  Gastrointestinal:  Negative for abdominal pain, blood in stool, constipation, diarrhea, heartburn, melena, nausea and vomiting.  Genitourinary:  Negative for dysuria, flank pain, frequency, hematuria and urgency.  Musculoskeletal:  Negative for back pain, joint pain and myalgias.  Skin:  Negative for rash.  Neurological:  Negative for dizziness, tingling, focal weakness, seizures, weakness and headaches.  Endo/Heme/Allergies:  Does not bruise/bleed easily.  Psychiatric/Behavioral:  Negative for depression and suicidal ideas. The patient does not have insomnia.       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     Past Surgical History:  Procedure Laterality Date   BONE MARROW BIOPSY     COLONOSCOPY     EXCISION MASS NECK Right 06/12/2020   Procedure: EXCISION MASS NECK;  Surgeon: Leafy Ro, MD;  Location: [facility] ORS;  Service: General;  Laterality: Right;   HERNIA REPAIR Right    at age 29-RIH   29 CATH INSERTION     PORTACATH PLACEMENT Right 06/24/2020   Procedure: INSERTION PORT-A-CATH;  Surgeon: Leafy Ro, MD;  Location: [facility] ORS;  Service: General;  Laterality: Right;    Social History   Socioeconomic History   Marital status: Single    Spouse name: Not on file   Number of children: Not on file   Years of education: Not on file   Highest education level: Not on file  Occupational History   Not on file  Tobacco Use   Smoking status: Former    Current packs/day: 0.00    Types:  Cigarettes    Quit date: 06/13/2020    Years since quitting: 2.6   Smokeless tobacco: Never  Vaping Use   Vaping status: Never Used  Substance and Sexual Activity   Alcohol use: Not Currently   Drug use: Not Currently    Types: Marijuana   Sexual activity: Not Currently  Other Topics Concern   Not on file  Social History Narrative   Has daughter and grandson in the home   Social Determinants of Health   Financial Resource Strain: Medium Risk (07/30/2021)   Received from [facility], [facility]   Overall Financial Resource Strain (CARDIA)    Difficulty of Paying Living Expenses: Somewhat hard  Food Insecurity: No Food Insecurity (07/30/2021)   Received from [facility], [facility]   Hunger Vital Sign    Worried About Running Out of Food in the Last Year: Never true    Ran Out of Food in the Last Year: Never true  Transportation Needs: No Transportation Needs (07/30/2021)   Received from [facility], [facility]   [facility] - Transportation    Lack of Transportation (Medical): No    Lack of Transportation (Non-Medical): No  Physical Activity: Not on file  Stress: No Stress Concern Present (07/30/2021)   Received from [facility], [facility]   [facility] - Occupational Stress Questionnaire    Feeling of Stress : Not at all  Social Connections: Not on file  Intimate Partner Violence: Not on file    Family History  Problem Relation Age of Onset   Anemia Mother    Hypertension Mother    Goiter Mother    Cancer Father    Cancer Sister    Multiple sclerosis Sister      Current Outpatient Medications:    aspirin EC 81 MG tablet, Take 1 tablet (81 mg total) by mouth daily. Swallow whole., Disp: 30 tablet, Rfl: 0   atenolol (TENORMIN) 25 MG tablet, Take 25 mg by mouth daily., Disp: , Rfl:    cholecalciferol (VITAMIN D3) 25 MCG (1000 UNIT) tablet, Take 1,000 Units by mouth daily., Disp: , Rfl:    dexamethasone  (DECADRON) 4 MG tablet, Take 4 mg tablet at  8 am and  4pm day before chemo, day 2 take 1 tablet at 4 pm only, day 3 take 1 tablet in am and 4 pm. Need to eat before taking med. Repeat the above every 3 weeks, Disp: 10 tablet, Rfl: 3   diphenhydrAMINE (BENADRYL) 2 % cream, Apply 1 Application topically 3 (three) times daily as needed for itching., Disp: , Rfl:    diphenhydrAMINE (BENADRYL) 25 mg capsule, Take 25 mg by mouth every 6 (six) hours as needed for itching or allergies., Disp: , Rfl:    doxycycline (VIBRA-TABS) 100 MG tablet, Take 1 tablet (100 mg total) by mouth 2 (two) times daily., Disp: 14 tablet, Rfl: 0   lisinopril (ZESTRIL) 10 MG tablet, Take 10 mg by mouth daily., Disp: , Rfl:    Omega-3 Fatty Acids (FISH OIL ADULT GUMMIES PO), Take by mouth., Disp: , Rfl:    ondansetron (ZOFRAN) 8 MG tablet, Take 1 tablet (8 mg total) by mouth every 8 (eight) hours as needed for nausea or vomiting. (Patient not taking: Reported on 12/07/2022), Disp: 30 tablet, Rfl: 1   potassium chloride SA (KLOR-CON M) 20 MEQ tablet, Take 1 tablet (20 mEq total) by mouth daily., Disp: 21 tablet, Rfl: 0   predniSONE (DELTASONE) 10 MG tablet, Take 5 tablets (50 mg) by mouth once daily on Day 1. Then take 4 tablets (40 mg) on Day 2, then take 3 tablets (30 mg) on Day 3, then take 2 tablets (20 mg) on Day 4, then take 1 tablet (10 mg) on Day 5, then take 1/2 tablet (5 mg) on Day 6, then stop., Disp: 16 tablet, Rfl: 0   prochlorperazine (COMPAZINE) 10 MG tablet, Take 1 tablet (10 mg total) by mouth every 6 (six) hours as needed for nausea or vomiting., Disp: 30 tablet, Rfl: 1   Tenofovir Alafenamide Fumarate (VEMLIDY) 25 MG TABS, Take 1 tablet (25 mg total) by mouth daily. Take with food., Disp: 30 tablet, Rfl: 2   triamcinolone ointment (KENALOG) 0.5 %, Apply 1 Application topically 2 (two) times daily., Disp: 30 g, Rfl: 3   vitamin C (ASCORBIC ACID) 500 MG tablet, Take 500 mg by mouth daily., Disp: , Rfl:  No current  facility-administered medications for this visit.  Facility-Administered Medications Ordered in Other Visits:    0.9 %  sodium chloride infusion, , Intravenous, Continuous, Brahmanday, Worthy Flank, MD, Stopped at 09/11/20 1502   heparin lock flush 100 unit/mL, 500 Units, Intravenous, Once, Creig Hines,  MD   heparin lock flush 100 unit/mL, 500 Units, Intravenous, Once, Creig Hines, MD   methotrexate (PF) 12 mg in sodium chloride (PF) 0.9 % INTRATHECAL chemo injection, , Intrathecal, Once, Creig Hines, MD   sodium chloride flush (NS) 0.9 % injection 10 mL, 10 mL, Intracatheter, PRN, Creig Hines, MD, 10 mL at 01/18/23 1545   sodium chloride flush (NS) 0.9 % injection 10 mL, 10 mL, Intravenous, PRN, Creig Hines, MD, 10 mL at 02/08/23 1305  Physical exam:  Vitals:   02/08/23 1311  BP: (!) 140/80  Pulse: 73  Resp: 18  Temp: (!) 97 F (36.1 C)  TempSrc: Tympanic  SpO2: 100%  Weight: 179 lb 3.2 oz (81.3 kg)   Physical Exam Cardiovascular:     Rate and Rhythm: Normal rate and regular rhythm.     Heart sounds: Normal heart sounds.  Pulmonary:     Effort: Pulmonary effort is normal.     Breath sounds: Normal breath sounds.  Skin:    General: Skin is warm and dry.  Neurological:     Mental Status: He is alert and oriented to person, place, and time.         Latest Ref Rng & Units 01/18/2023    1:00 PM  CMP  Glucose 70 - 99 mg/dL 254   BUN 8 - 23 mg/dL 10   Creatinine 2.70 - 1.24 mg/dL 6.23   Sodium 762 - 831 mmol/L 134   Potassium 3.5 - 5.1 mmol/L 3.7   Chloride 98 - 111 mmol/L 102   CO2 22 - 32 mmol/L 25   Calcium 8.9 - 10.3 mg/dL 8.9   Total Protein 6.5 - 8.1 g/dL 6.4   Total Bilirubin <5.1 mg/dL 0.5   Alkaline Phos 38 - 126 U/L 102   AST 15 - 41 U/L 34   ALT 0 - 44 U/L 20       Latest Ref Rng & Units 02/08/2023   12:51 PM  CBC  WBC 4.0 - 10.5 K/uL 3.8   Hemoglobin 13.0 - 17.0 g/dL 76.1   Hematocrit 60.7 - 52.0 % 34.6   Platelets 150 - 400 K/uL 179       Assessment and plan- Patient is a 62 y.o. 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  He is here for on treatment assessment prior to cycle 9 of Zynlonta  Counts okay to proceed with cycle 9 of Zynlonta today.  I will see him back in 3 weeks for cycle 10.  Plan to repeat PET CT scan in 4 to 5 weeks time.  If PET scan shows no active lymphoma I will consider giving him a break from [facility] and consider restarting treatment upon progression.  Overall he is tolerating treatments well without any significant side effects   Visit Diagnosis 1. High grade B-cell lymphoma (HCC)   2. Encounter for antineoplastic chemotherapy      Dr. Owens Shark, MD, MPH [facility] at [facility] [phone] 02/08/2023 1:39 PM

## 2023-02-09 ENCOUNTER — Other Ambulatory Visit: Payer: Self-pay

## 2023-02-09 ENCOUNTER — Inpatient Hospital Stay: Payer: Medicaid Other

## 2023-02-09 DIAGNOSIS — Z5112 Encounter for antineoplastic immunotherapy: Secondary | ICD-10-CM | POA: Diagnosis not present

## 2023-02-09 DIAGNOSIS — C8510 Unspecified B-cell lymphoma, unspecified site: Secondary | ICD-10-CM

## 2023-02-09 MED ORDER — PEGFILGRASTIM-FPGK 6 MG/0.6ML ~~LOC~~ SOSY
6.0000 mg | PREFILLED_SYRINGE | Freq: Once | SUBCUTANEOUS | Status: AC
Start: 2023-02-09 — End: 2023-02-09
  Administered 2023-02-09: 6 mg via SUBCUTANEOUS
  Filled 2023-02-09: qty 0.6

## 2023-02-17 ENCOUNTER — Other Ambulatory Visit: Payer: Self-pay

## 2023-02-18 ENCOUNTER — Other Ambulatory Visit: Payer: Self-pay | Admitting: Oncology

## 2023-02-18 ENCOUNTER — Other Ambulatory Visit: Payer: Self-pay

## 2023-03-01 ENCOUNTER — Ambulatory Visit: Payer: Medicaid Other

## 2023-03-01 ENCOUNTER — Ambulatory Visit: Payer: Medicaid Other | Admitting: Oncology

## 2023-03-01 ENCOUNTER — Other Ambulatory Visit: Payer: Medicaid Other

## 2023-03-03 ENCOUNTER — Ambulatory Visit: Payer: Medicaid Other

## 2023-03-07 ENCOUNTER — Ambulatory Visit
Admission: RE | Admit: 2023-03-07 | Discharge: 2023-03-07 | Disposition: A | Discharge status: Home / Self Care | Payer: Medicaid Other | Source: Ambulatory Visit | Attending: Oncology | Admitting: Oncology

## 2023-03-07 DIAGNOSIS — C8510 Unspecified B-cell lymphoma, unspecified site: Secondary | ICD-10-CM | POA: Diagnosis present

## 2023-03-07 DIAGNOSIS — I700 Atherosclerosis of aorta: Secondary | ICD-10-CM | POA: Insufficient documentation

## 2023-03-07 DIAGNOSIS — R918 Other nonspecific abnormal finding of lung field: Secondary | ICD-10-CM | POA: Insufficient documentation

## 2023-03-07 LAB — GLUCOSE, CAPILLARY: Glucose-Capillary: 84 mg/dL (ref 70–99)

## 2023-03-07 MED ORDER — FLUDEOXYGLUCOSE F - 18 (FDG) INJECTION
9.9100 | Freq: Once | INTRAVENOUS | Status: AC | PRN
  Administered 2023-03-07: 9.91 via INTRAVENOUS

## 2023-03-17 ENCOUNTER — Encounter: Payer: Self-pay | Admitting: Oncology

## 2023-03-17 NOTE — Telephone Encounter (Signed)
 Error

## 2023-04-13 ENCOUNTER — Other Ambulatory Visit: Payer: Self-pay

## 2023-04-13 NOTE — Progress Notes (Signed)
Disenrolling patient from [facility] due to compliance.

## 2023-04-27 ENCOUNTER — Inpatient Hospital Stay: Payer: Medicaid Other | Attending: Oncology

## 2023-04-27 ENCOUNTER — Ambulatory Visit: Payer: Medicaid Other

## 2023-04-27 DIAGNOSIS — Z452 Encounter for adjustment and management of vascular access device: Secondary | ICD-10-CM | POA: Diagnosis present

## 2023-04-27 DIAGNOSIS — C8338 Diffuse large B-cell lymphoma, lymph nodes of multiple sites: Secondary | ICD-10-CM | POA: Diagnosis present

## 2023-04-27 DIAGNOSIS — Z95828 Presence of other vascular implants and grafts: Secondary | ICD-10-CM

## 2023-04-27 LAB — COMPREHENSIVE METABOLIC PANEL
ALT: 18 U/L (ref 0–44)
AST: 37 U/L (ref 15–41)
Albumin: 3.5 g/dL (ref 3.5–5.0)
Alkaline Phosphatase: 116 U/L (ref 38–126)
Anion gap: 9 (ref 5–15)
BUN: 6 mg/dL — ABNORMAL LOW (ref 8–23)
CO2: 27 mmol/L (ref 22–32)
Calcium: 9.4 mg/dL (ref 8.9–10.3)
Chloride: 100 mmol/L (ref 98–111)
Creatinine, Ser: 0.84 mg/dL (ref 0.61–1.24)
GFR, Estimated: >60 mL/min (ref >60)
Glucose, Bld: 86 mg/dL (ref 70–99)
Potassium: 3.5 mmol/L (ref 3.5–5.1)
Sodium: 136 mmol/L (ref 135–145)
Total Bilirubin: 0.8 mg/dL (ref 0.0–1.2)
Total Protein: 6.7 g/dL (ref 6.5–8.1)

## 2023-04-27 LAB — CBC WITH DIFFERENTIAL/PLATELET
Abs Immature Granulocytes: 0.1 10*3/uL — ABNORMAL HIGH (ref 0.00–0.07)
Basophils Absolute: 0 10*3/uL (ref 0.0–0.1)
Basophils Relative: 1 %
Eosinophils Absolute: 0 10*3/uL (ref 0.0–0.5)
Eosinophils Relative: 0 %
HCT: 39.5 % (ref 39.0–52.0)
Hemoglobin: 13 g/dL (ref 13.0–17.0)
Immature Granulocytes: 4 %
Lymphocytes Relative: 29 %
Lymphs Abs: 0.8 10*3/uL (ref 0.7–4.0)
MCH: 29.7 pg (ref 26.0–34.0)
MCHC: 32.9 g/dL (ref 30.0–36.0)
MCV: 90.4 fL (ref 80.0–100.0)
Monocytes Absolute: 0.3 10*3/uL (ref 0.1–1.0)
Monocytes Relative: 12 %
Neutro Abs: 1.4 10*3/uL — ABNORMAL LOW (ref 1.7–7.7)
Neutrophils Relative %: 54 %
Platelets: 203 10*3/uL (ref 150–400)
RBC: 4.37 MIL/uL (ref 4.22–5.81)
RDW: 12.7 % (ref 11.5–15.5)
WBC: 2.6 10*3/uL — ABNORMAL LOW (ref 4.0–10.5)
nRBC: 0 % (ref 0.0–0.2)

## 2023-04-27 MED ORDER — HEPARIN SOD (PORK) LOCK FLUSH 100 UNIT/ML IV SOLN
500.0000 [IU] | Freq: Once | INTRAVENOUS | Status: AC
Start: 2023-04-27 — End: ?
  Filled 2023-04-27: qty 5

## 2023-04-27 MED ORDER — SODIUM CHLORIDE 0.9% FLUSH
10.0000 mL | Freq: Once | INTRAVENOUS | Status: AC
  Filled 2023-04-27: qty 10

## 2023-04-28 ENCOUNTER — Other Ambulatory Visit: Payer: Self-pay

## 2023-04-28 ENCOUNTER — Encounter: Payer: Self-pay | Admitting: *Deleted

## 2023-05-04 ENCOUNTER — Telehealth: Payer: Self-pay | Admitting: *Deleted

## 2023-05-04 ENCOUNTER — Inpatient Hospital Stay: Payer: Medicaid Other

## 2023-05-04 NOTE — Telephone Encounter (Signed)
The patient called to say that when he saw the message on my chart that the cancer center is closing at 2 pm, he thought he need not need to come . He needs an another appt for the cath flow . I asked megan to reschedule and call him with next  visit.

## 2023-05-05 ENCOUNTER — Other Ambulatory Visit: Payer: Self-pay | Admitting: *Deleted

## 2023-05-05 ENCOUNTER — Inpatient Hospital Stay: Payer: Medicaid Other

## 2023-05-05 DIAGNOSIS — Z95828 Presence of other vascular implants and grafts: Secondary | ICD-10-CM

## 2023-05-05 DIAGNOSIS — C8510 Unspecified B-cell lymphoma, unspecified site: Secondary | ICD-10-CM

## 2023-05-05 DIAGNOSIS — C8338 Diffuse large B-cell lymphoma, lymph nodes of multiple sites: Secondary | ICD-10-CM | POA: Diagnosis not present

## 2023-05-05 MED ORDER — SODIUM CHLORIDE 0.9% FLUSH
10.0000 mL | INTRAVENOUS | Status: DC | PRN
Start: 2023-05-05 — End: 2023-05-05
  Administered 2023-05-05: 10 mL via INTRAVENOUS
  Filled 2023-05-05: qty 10

## 2023-05-05 MED ORDER — ALTEPLASE 2 MG IJ SOLR
2.0000 mg | Freq: Once | INTRAMUSCULAR | Status: DC | PRN
Start: 2023-05-05 — End: 2023-05-05
  Filled 2023-05-05: qty 2

## 2023-05-05 MED ORDER — HEPARIN SOD (PORK) LOCK FLUSH 100 UNIT/ML IV SOLN
500.0000 [IU] | Freq: Once | INTRAVENOUS | Status: AC
  Administered 2023-05-05: 500 [IU] via INTRAVENOUS
  Filled 2023-05-05: qty 5

## 2023-05-05 NOTE — Progress Notes (Signed)
Patient here today for scheduled cathflo instillation. Previous visit yielded no blood return from port.  Port accessed today and blood return noted. No cathflo needed at this time. MD notified. Port de accessed and patient to return to clinic as scheduled.

## 2023-05-05 NOTE — Progress Notes (Signed)
Per Dr. Smith Robert- pt needs apt for March 17th with labs cbc with diff cmp and ldh. Lab orders entered.

## 2023-05-09 ENCOUNTER — Encounter: Payer: Self-pay | Admitting: Oncology

## 2023-05-31 ENCOUNTER — Encounter: Payer: Self-pay | Admitting: Oncology

## 2023-05-31 ENCOUNTER — Inpatient Hospital Stay: Payer: Medicaid Other | Attending: Oncology

## 2023-05-31 ENCOUNTER — Inpatient Hospital Stay (HOSPITAL_BASED_OUTPATIENT_CLINIC_OR_DEPARTMENT_OTHER): Payer: Medicaid Other | Admitting: Oncology

## 2023-05-31 VITALS — BP 160/98 | HR 78 | Temp 96.0°F | Resp 19 | Ht 73.0 in | Wt 170.0 lb

## 2023-05-31 DIAGNOSIS — Z8349 Family history of other endocrine, nutritional and metabolic diseases: Secondary | ICD-10-CM | POA: Diagnosis not present

## 2023-05-31 DIAGNOSIS — C8338 Diffuse large B-cell lymphoma, lymph nodes of multiple sites: Secondary | ICD-10-CM | POA: Insufficient documentation

## 2023-05-31 DIAGNOSIS — Z7189 Other specified counseling: Secondary | ICD-10-CM | POA: Diagnosis not present

## 2023-05-31 DIAGNOSIS — M7989 Other specified soft tissue disorders: Secondary | ICD-10-CM | POA: Diagnosis not present

## 2023-05-31 DIAGNOSIS — C8510 Unspecified B-cell lymphoma, unspecified site: Secondary | ICD-10-CM

## 2023-05-31 DIAGNOSIS — Z7961 Long term (current) use of immunomodulator: Secondary | ICD-10-CM | POA: Diagnosis not present

## 2023-05-31 DIAGNOSIS — Z7982 Long term (current) use of aspirin: Secondary | ICD-10-CM | POA: Diagnosis not present

## 2023-05-31 DIAGNOSIS — Z95828 Presence of other vascular implants and grafts: Secondary | ICD-10-CM

## 2023-05-31 DIAGNOSIS — R5383 Other fatigue: Secondary | ICD-10-CM | POA: Insufficient documentation

## 2023-05-31 DIAGNOSIS — R0602 Shortness of breath: Secondary | ICD-10-CM | POA: Insufficient documentation

## 2023-05-31 DIAGNOSIS — Z8249 Family history of ischemic heart disease and other diseases of the circulatory system: Secondary | ICD-10-CM | POA: Diagnosis not present

## 2023-05-31 DIAGNOSIS — Z8269 Family history of other diseases of the musculoskeletal system and connective tissue: Secondary | ICD-10-CM | POA: Diagnosis not present

## 2023-05-31 DIAGNOSIS — Z79899 Other long term (current) drug therapy: Secondary | ICD-10-CM | POA: Diagnosis not present

## 2023-05-31 DIAGNOSIS — D709 Neutropenia, unspecified: Secondary | ICD-10-CM | POA: Diagnosis not present

## 2023-05-31 DIAGNOSIS — Z7952 Long term (current) use of systemic steroids: Secondary | ICD-10-CM | POA: Insufficient documentation

## 2023-05-31 DIAGNOSIS — Z832 Family history of diseases of the blood and blood-forming organs and certain disorders involving the immune mechanism: Secondary | ICD-10-CM | POA: Diagnosis not present

## 2023-05-31 DIAGNOSIS — Z809 Family history of malignant neoplasm, unspecified: Secondary | ICD-10-CM | POA: Diagnosis not present

## 2023-05-31 DIAGNOSIS — Z5986 Financial insecurity: Secondary | ICD-10-CM | POA: Diagnosis not present

## 2023-05-31 DIAGNOSIS — Z87891 Personal history of nicotine dependence: Secondary | ICD-10-CM | POA: Insufficient documentation

## 2023-05-31 LAB — CBC WITH DIFFERENTIAL (CANCER CENTER ONLY)
Abs Immature Granulocytes: 0.05 10*3/uL (ref 0.00–0.07)
Basophils Absolute: 0 10*3/uL (ref 0.0–0.1)
Basophils Relative: 0 %
Eosinophils Absolute: 0 10*3/uL (ref 0.0–0.5)
Eosinophils Relative: 1 %
HCT: 38.3 % — ABNORMAL LOW (ref 39.0–52.0)
Hemoglobin: 12.7 g/dL — ABNORMAL LOW (ref 13.0–17.0)
Immature Granulocytes: 2 %
Lymphocytes Relative: 36 %
Lymphs Abs: 1.1 10*3/uL (ref 0.7–4.0)
MCH: 29.8 pg (ref 26.0–34.0)
MCHC: 33.2 g/dL (ref 30.0–36.0)
MCV: 89.9 fL (ref 80.0–100.0)
Monocytes Absolute: 0.2 10*3/uL (ref 0.1–1.0)
Monocytes Relative: 7 %
Neutro Abs: 1.6 10*3/uL — ABNORMAL LOW (ref 1.7–7.7)
Neutrophils Relative %: 54 %
Platelet Count: 155 10*3/uL (ref 150–400)
RBC: 4.26 MIL/uL (ref 4.22–5.81)
RDW: 14.6 % (ref 11.5–15.5)
WBC Count: 3 10*3/uL — ABNORMAL LOW (ref 4.0–10.5)
nRBC: 0 % (ref 0.0–0.2)

## 2023-05-31 LAB — CMP (CANCER CENTER ONLY)
ALT: 24 U/L (ref 0–44)
AST: 48 U/L — ABNORMAL HIGH (ref 15–41)
Albumin: 3.9 g/dL (ref 3.5–5.0)
Alkaline Phosphatase: 96 U/L (ref 38–126)
Anion gap: 9 (ref 5–15)
BUN: 8 mg/dL (ref 8–23)
CO2: 25 mmol/L (ref 22–32)
Calcium: 9.1 mg/dL (ref 8.9–10.3)
Chloride: 101 mmol/L (ref 98–111)
Creatinine: 0.77 mg/dL (ref 0.61–1.24)
GFR, Estimated: >60 mL/min (ref >60)
Glucose, Bld: 142 mg/dL — ABNORMAL HIGH (ref 70–99)
Potassium: 3.2 mmol/L — ABNORMAL LOW (ref 3.5–5.1)
Sodium: 135 mmol/L (ref 135–145)
Total Bilirubin: 1 mg/dL (ref 0.0–1.2)
Total Protein: 6.8 g/dL (ref 6.5–8.1)

## 2023-05-31 LAB — LACTATE DEHYDROGENASE: LDH: 275 U/L — ABNORMAL HIGH (ref 98–192)

## 2023-05-31 MED ORDER — HEPARIN SOD (PORK) LOCK FLUSH 100 UNIT/ML IV SOLN
500.0000 [IU] | Freq: Once | INTRAVENOUS | Status: AC
  Administered 2023-05-31: 500 [IU] via INTRAVENOUS
  Filled 2023-05-31: qty 5

## 2023-05-31 MED ORDER — SODIUM CHLORIDE 0.9% FLUSH
10.0000 mL | Freq: Once | INTRAVENOUS | Status: AC
  Administered 2023-05-31: 10 mL via INTRAVENOUS
  Filled 2023-05-31: qty 10

## 2023-05-31 NOTE — Progress Notes (Signed)
 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  This was followed by a PET CT scan which showed extensive FDG avid adenopathy in the neck chest abdomen and pelvis.  FDG avid splenic lesions.  Solitary intramuscular FDG avid lesion in the right biceps femoris muscle.   Supraclavicular excisional lymph node biopsy showed high-grade B-cell lymphoma germinal center type Ki-67 greater than 95%.  FISH testing was positive for Bcl-2 BCL6 and MYC consistent with triple hit lymphoma.  By NCCN IPI score would be 4 based on age and elevated LDH and stage IV (intramuscular biceps femoris lesion) which puts him in the high intermediate risk group. 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   Patient received RCHOP for cycle 1 with plans for DA [facility] with cycle 2. IT MTX for CNS prophylaxis and he has received 3 cycles but declined further cycles.  MRI brain negative for lymphoma.  Chemotherapy was not being able to escalated upwards due to neutropenia which has persisted to 3 weeks and he has been receiving treatment every 4 weeks   PET CT scan  after 3 cycles of chemotherapy showedComplete or near complete metabolic response.  Residual disease in the anterior upper right mediastinum slightly greater than background mediastinal activity   Patient found to have a clinically palpable right supraclavicular lymph node which was subsequently biopsied and was consistent with previously diagnosed lymphoma.  PET CT scan showedNew right level 5 adenopathy in the lower neck with dominant lymph node 1.6 cm Deauville 5.  No other findings of acute active lymphoma in chest abdomen pelvis and bones.  MRI brain negative for leptomeningeal disease.   Patient seen for second opinion by Dr. Aviva Kluver at [facility] for consideration of CAR-T cell therapy.  Patient did not wish to proceed with CAR-T cell treatment after reviewing risks versus benefits.  Moreover patient's health insurance will also not cover CAR-T cell therapy. Patient started on second line tafasitamab/Revlimid regimen in June 2023.  He is on Revlimid 15 mg 3 weeks on and 1 week off.  Disease progression after 3 cycles and patient was switched to [facility] regimen     Patient was noted to haveSignificant decrease in the size of supraclavicular lymph nodes consistent with treatment response Deauville 3 after 3 cycles of Pola BR chemotherapy.  Patient has only received 4 cycles of chemotherapy so far due to persistent neutropenia despite receiving growth factor support.  Repeat PET scan showed new/recurrent hypermetabolic subcutaneous tissue in the high right back/inferior neck consistent with recurrent lymphoma Deauville 5.  He received palliative radiation to that area.  Patient was started on Zynlonta in June 2024 after he was found to have recurrent disease involving subcutaneous right scapular  region  Interval history-patient can feel 2 new swellings in his right arm over the last couple of weeks.  He reports some increasing fatigue and occasional exertional shortness of breath.  Appetite is good but he has  lost 9 pounds in the last 4 months  ECOG PS- 0 Pain scale- 0   Review of systems- Review of Systems  Constitutional:  Positive for malaise/fatigue and weight loss. Negative for chills and fever.  HENT:  Negative for congestion, ear discharge and nosebleeds.   Eyes:  Negative for blurred vision.  Respiratory:  Negative for cough, hemoptysis, sputum production, shortness of breath and wheezing.   Cardiovascular:  Negative for chest pain, palpitations, orthopnea and claudication.  Gastrointestinal:  Negative for abdominal pain, blood in stool, constipation, diarrhea, heartburn, melena, nausea and vomiting.  Genitourinary:  Negative for dysuria, flank pain, frequency, hematuria and urgency.  Musculoskeletal:  Negative for back pain, joint pain and myalgias.  Skin:  Negative for rash.  Neurological:  Negative for dizziness, tingling, focal weakness, seizures, weakness and headaches.  Endo/Heme/Allergies:  Does not bruise/bleed easily.  Psychiatric/Behavioral:  Negative for depression and suicidal ideas. The patient does not have insomnia.       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     Past Surgical History:  Procedure Laterality Date   BONE MARROW BIOPSY     COLONOSCOPY     EXCISION MASS NECK Right 06/12/2020   Procedure: EXCISION MASS NECK;  Surgeon: Leafy Ro, MD;  Location: [facility] ORS;  Service: General;  Laterality: Right;   HERNIA REPAIR Right    at age 41-RIH   37 CATH INSERTION     PORTACATH PLACEMENT Right 06/24/2020   Procedure: INSERTION PORT-A-CATH;  Surgeon: Leafy Ro, MD;  Location: [facility] ORS;  Service: General;  Laterality: Right;    Social History   Socioeconomic History   Marital status: Single    Spouse name: Not on file   Number of children: Not on file   Years of education: Not on file   Highest education  level: Not on file  Occupational History   Not on file  Tobacco Use   Smoking status: Former    Current packs/day: 0.00    Types: Cigarettes    Quit date: 06/13/2020    Years since quitting: 2.9   Smokeless tobacco: Never  Vaping Use   Vaping status: Never Used  Substance and Sexual Activity   Alcohol use: Not Currently   Drug use: Not Currently    Types: Marijuana   Sexual activity: Not Currently  Other Topics Concern   Not on file  Social History Narrative   Has daughter and grandson in the home   Social Drivers of Health   Financial Resource Strain: Medium Risk (07/30/2021)   Received from [facility], [facility]   Overall Financial Resource Strain (CARDIA)    Difficulty of Paying Living Expenses: Somewhat hard  Food Insecurity: No Food Insecurity (07/30/2021)   Received from [facility], [facility]   Hunger Vital Sign    Worried About Running Out of Food in the Last Year: Never true    Ran Out of Food in the Last Year: Never true  Transportation Needs: No Transportation Needs (07/30/2021)   Received from [facility], [facility]  PRAPARE - Administrator, Civil Service (Medical): No    Lack of Transportation (Non-Medical): No  Physical Activity: Not on file  Stress: No Stress Concern Present (07/30/2021)   Received from [facility], [facility] - Occupational Stress Questionnaire    Feeling of Stress : Not at all  Social Connections: Not on file  Intimate Partner Violence: Not on file    Family History  Problem Relation Age of Onset   Anemia Mother    Hypertension Mother    Goiter Mother    Cancer Father    Cancer Sister    Multiple sclerosis Sister      Current Outpatient Medications:    aspirin EC 81 MG tablet, Take 1 tablet (81 mg total) by mouth daily. Swallow whole., Disp: 30 tablet, Rfl: 0   atenolol (TENORMIN) 25 MG tablet, Take 25 mg by mouth daily. (Patient not  taking: Reported on 05/31/2023), Disp: , Rfl:    cholecalciferol (VITAMIN D3) 25 MCG (1000 UNIT) tablet, Take 1,000 Units by mouth daily., Disp: , Rfl:    dexamethasone (DECADRON) 4 MG tablet, Take 4 mg tablet at  8 am and  4pm day before chemo, day 2 take 1 tablet at 4 pm only, day 3 take 1 tablet in am and 4 pm. Need to eat before taking med. Repeat the above every 3 weeks, Disp: 10 tablet, Rfl: 3   diphenhydrAMINE (BENADRYL) 2 % cream, Apply 1 Application topically 3 (three) times daily as needed for itching., Disp: , Rfl:    diphenhydrAMINE (BENADRYL) 25 mg capsule, Take 25 mg by mouth every 6 (six) hours as needed for itching or allergies., Disp: , Rfl:    doxycycline (VIBRA-TABS) 100 MG tablet, Take 1 tablet (100 mg total) by mouth 2 (two) times daily., Disp: 14 tablet, Rfl: 0   lisinopril (ZESTRIL) 10 MG tablet, Take 10 mg by mouth daily., Disp: , Rfl:    Omega-3 Fatty Acids (FISH OIL ADULT GUMMIES PO), Take by mouth., Disp: , Rfl:    ondansetron (ZOFRAN) 8 MG tablet, Take 1 tablet (8 mg total) by mouth every 8 (eight) hours as needed for nausea or vomiting. (Patient not taking: Reported on 12/07/2022), Disp: 30 tablet, Rfl: 1   potassium chloride SA (KLOR-CON M) 20 MEQ tablet, Take 1 tablet (20 mEq total) by mouth daily., Disp: 21 tablet, Rfl: 0   predniSONE (DELTASONE) 10 MG tablet, Take 5 tablets (50 mg) by mouth once daily on Day 1. Then take 4 tablets (40 mg) on Day 2, then take 3 tablets (30 mg) on Day 3, then take 2 tablets (20 mg) on Day 4, then take 1 tablet (10 mg) on Day 5, then take 1/2 tablet (5 mg) on Day 6, then stop., Disp: 16 tablet, Rfl: 0   prochlorperazine (COMPAZINE) 10 MG tablet, Take 1 tablet (10 mg total) by mouth every 6 (six) hours as needed for nausea or vomiting., Disp: 30 tablet, Rfl: 1   Tenofovir Alafenamide Fumarate (VEMLIDY) 25 MG TABS, Take 1 tablet (25 mg total) by mouth daily. Take with food., Disp: 30 tablet, Rfl: 2   triamcinolone ointment (KENALOG) 0.5 %,  Apply 1 Application topically 2 (two) times daily., Disp: 30 g, Rfl: 3   vitamin C (ASCORBIC ACID) 500 MG tablet, Take 500 mg by mouth daily., Disp: , Rfl:  No current facility-administered medications for this visit.  Facility-Administered Medications Ordered in Other Visits:  0.9 %  sodium chloride infusion, , Intravenous, Continuous, Earna Coder, MD, Stopped at 09/11/20 1502   heparin lock flush 100 unit/mL, 500 Units, Intravenous, Once, Creig Hines, MD   heparin lock flush 100 unit/mL, 500 Units, Intravenous, Once, Creig Hines, MD   methotrexate (PF) 12 mg in sodium chloride (PF) 0.9 % INTRATHECAL chemo injection, , Intrathecal, Once, Creig Hines, MD   sodium chloride flush (NS) 0.9 % injection 10 mL, 10 mL, Intracatheter, PRN, Creig Hines, MD, 10 mL at 01/18/23 1545   sodium chloride flush (NS) 0.9 % injection 10 mL, 10 mL, Intravenous, Once, Creig Hines, MD  Physical exam:  Vitals:   05/31/23 0909  BP: (!) 160/98  Pulse: 78  Resp: 19  Temp: (!) 96 F (35.6 C)  TempSrc: Tympanic  Weight: 170 lb (77.1 kg)  Height: 6\' 1"  (1.854 m)   Physical Exam Cardiovascular:     Rate and Rhythm: Normal rate and regular rhythm.     Heart sounds: Normal heart sounds.  Pulmonary:     Effort: Pulmonary effort is normal.     Breath sounds: Normal breath sounds.  Abdominal:     General: Bowel sounds are normal.     Palpations: Abdomen is soft.  Musculoskeletal:     Comments: 2 palpable subcutaneous masses roughly about 2 cm in size palpable on the undersurface of the right arm.  No palpable cervical supraclavicular axillary or inguinal adenopathy  Skin:    General: Skin is warm and dry.  Neurological:     Mental Status: He is alert and oriented to person, place, and time.         Latest Ref Rng & Units 05/31/2023    8:53 AM  CMP  Glucose 70 - 99 mg/dL 782   BUN 8 - 23 mg/dL 8   Creatinine 9.56 - 2.13 mg/dL 0.86   Sodium 578 - 469 mmol/L 135   Potassium 3.5  - 5.1 mmol/L 3.2   Chloride 98 - 111 mmol/L 101   CO2 22 - 32 mmol/L 25   Calcium 8.9 - 10.3 mg/dL 9.1   Total Protein 6.5 - 8.1 g/dL 6.8   Total Bilirubin 0.0 - 1.2 mg/dL 1.0   Alkaline Phos 38 - 126 U/L 96   AST 15 - 41 U/L 48   ALT 0 - 44 U/L 24       Latest Ref Rng & Units 05/31/2023    8:53 AM  CBC  WBC 4.0 - 10.5 K/uL 3.0   Hemoglobin 13.0 - 17.0 g/dL 62.9   Hematocrit 52.8 - 52.0 % 38.3   Platelets 150 - 400 K/uL 155     No images are attached to the encounter.  No results found.   Assessment and plan- Patient is a 63 y.o. 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  He is here for routine follow-up  Patient was receiving Zynlonta chemotherapy and received 9 cycles up until November 2024.  He was tolerating it well without any significant side effects but desired a treatment break.  PET scan in December 2024 showed no evidence of active lymphoma.  He has not received any treatment since then.  On exam today there are 2 distinct palpable masses on the undersurface of his right arm consistent with lymphoma recurrence.  I would therefore recommend getting a repeat PET scan at this time to assess extent of the disease and restart Zynlonta in 1 week's time.  He will now onwards continues in nontargeted progression or toxicity.  We will repeat his PET scan 3 months after starting Zynlonta.  If there is continued progression on Zynlonta I will refer him back to [facility] for consideration of bispecific therapy or CAR-T cell therapy   Visit Diagnosis 1. High grade B-cell lymphoma (HCC)   2. Goals of care, counseling/discussion      Dr. Owens Shark, MD, MPH [facility] [phone] 05/31/2023 12:25 PM

## 2023-06-01 ENCOUNTER — Encounter: Payer: Self-pay | Admitting: Oncology

## 2023-06-03 ENCOUNTER — Ambulatory Visit
Admission: RE | Admit: 2023-06-03 | Discharge: 2023-06-03 | Disposition: A | Discharge status: Home / Self Care | Source: Ambulatory Visit | Attending: Oncology | Admitting: Oncology

## 2023-06-03 DIAGNOSIS — C8510 Unspecified B-cell lymphoma, unspecified site: Secondary | ICD-10-CM | POA: Diagnosis present

## 2023-06-03 DIAGNOSIS — R222 Localized swelling, mass and lump, trunk: Secondary | ICD-10-CM | POA: Diagnosis not present

## 2023-06-03 DIAGNOSIS — K769 Liver disease, unspecified: Secondary | ICD-10-CM | POA: Insufficient documentation

## 2023-06-03 DIAGNOSIS — M899 Disorder of bone, unspecified: Secondary | ICD-10-CM | POA: Insufficient documentation

## 2023-06-03 LAB — GLUCOSE, CAPILLARY: Glucose-Capillary: 88 mg/dL (ref 70–99)

## 2023-06-03 MED ORDER — FLUDEOXYGLUCOSE F - 18 (FDG) INJECTION
9.0100 | Freq: Once | INTRAVENOUS | Status: AC | PRN
  Administered 2023-06-03: 9.01 via INTRAVENOUS

## 2023-06-07 ENCOUNTER — Other Ambulatory Visit: Payer: Self-pay

## 2023-06-07 ENCOUNTER — Encounter: Payer: Self-pay | Admitting: Oncology

## 2023-06-07 ENCOUNTER — Other Ambulatory Visit: Payer: Self-pay | Admitting: Oncology

## 2023-06-07 ENCOUNTER — Inpatient Hospital Stay

## 2023-06-07 ENCOUNTER — Inpatient Hospital Stay (HOSPITAL_BASED_OUTPATIENT_CLINIC_OR_DEPARTMENT_OTHER): Admitting: Oncology

## 2023-06-07 VITALS — BP 163/91 | HR 79 | Temp 96.0°F | Resp 18 | Ht 73.0 in | Wt 173.0 lb

## 2023-06-07 DIAGNOSIS — C8510 Unspecified B-cell lymphoma, unspecified site: Secondary | ICD-10-CM

## 2023-06-07 DIAGNOSIS — Z5111 Encounter for antineoplastic chemotherapy: Secondary | ICD-10-CM

## 2023-06-07 DIAGNOSIS — C8338 Diffuse large B-cell lymphoma, lymph nodes of multiple sites: Secondary | ICD-10-CM | POA: Diagnosis not present

## 2023-06-07 LAB — CBC WITH DIFFERENTIAL (CANCER CENTER ONLY)
Abs Immature Granulocytes: 0.05 10*3/uL (ref 0.00–0.07)
Basophils Absolute: 0 10*3/uL (ref 0.0–0.1)
Basophils Relative: 1 %
Eosinophils Absolute: 0.1 10*3/uL (ref 0.0–0.5)
Eosinophils Relative: 3 %
HCT: 36.1 % — ABNORMAL LOW (ref 39.0–52.0)
Hemoglobin: 12.1 g/dL — ABNORMAL LOW (ref 13.0–17.0)
Immature Granulocytes: 2 %
Lymphocytes Relative: 35 %
Lymphs Abs: 0.9 10*3/uL (ref 0.7–4.0)
MCH: 29.9 pg (ref 26.0–34.0)
MCHC: 33.5 g/dL (ref 30.0–36.0)
MCV: 89.1 fL (ref 80.0–100.0)
Monocytes Absolute: 0.2 10*3/uL (ref 0.1–1.0)
Monocytes Relative: 7 %
Neutro Abs: 1.3 10*3/uL — ABNORMAL LOW (ref 1.7–7.7)
Neutrophils Relative %: 52 %
Platelet Count: 161 10*3/uL (ref 150–400)
RBC: 4.05 MIL/uL — ABNORMAL LOW (ref 4.22–5.81)
RDW: 14.7 % (ref 11.5–15.5)
WBC Count: 2.4 10*3/uL — ABNORMAL LOW (ref 4.0–10.5)
nRBC: 0 % (ref 0.0–0.2)

## 2023-06-07 LAB — CMP (CANCER CENTER ONLY)
ALT: 22 U/L (ref 0–44)
AST: 36 U/L (ref 15–41)
Albumin: 3.6 g/dL (ref 3.5–5.0)
Alkaline Phosphatase: 86 U/L (ref 38–126)
Anion gap: 10 (ref 5–15)
BUN: 8 mg/dL (ref 8–23)
CO2: 26 mmol/L (ref 22–32)
Calcium: 8.8 mg/dL — ABNORMAL LOW (ref 8.9–10.3)
Chloride: 102 mmol/L (ref 98–111)
Creatinine: 1.01 mg/dL (ref 0.61–1.24)
GFR, Estimated: >60 mL/min (ref >60)
Glucose, Bld: 126 mg/dL — ABNORMAL HIGH (ref 70–99)
Potassium: 3.5 mmol/L (ref 3.5–5.1)
Sodium: 138 mmol/L (ref 135–145)
Total Bilirubin: 0.7 mg/dL (ref 0.0–1.2)
Total Protein: 6.2 g/dL — ABNORMAL LOW (ref 6.5–8.1)

## 2023-06-07 MED ORDER — DEXTROSE 5 % IV SOLN
Freq: Once | INTRAVENOUS | Status: DC
  Filled 2023-06-07: qty 250

## 2023-06-07 MED ORDER — HEPARIN SOD (PORK) LOCK FLUSH 100 UNIT/ML IV SOLN
500.0000 [IU] | Freq: Once | INTRAVENOUS | Status: AC | PRN
  Administered 2023-06-07: 500 [IU]
  Filled 2023-06-07: qty 5

## 2023-06-07 MED ORDER — LONCASTUXIMAB TESIRINE-LPYL CHEMO INJECTION 10 MG: 0.0750 mg/kg | Freq: Once | INTRAVENOUS | Status: DC

## 2023-06-07 MED ORDER — DEXAMETHASONE 4 MG PO TABS
ORAL_TABLET | ORAL | 3 refills | Status: AC
  Filled 2023-06-07: qty 10, 3d supply, fill #0

## 2023-06-07 MED ORDER — DEXAMETHASONE SODIUM PHOSPHATE 10 MG/ML IJ SOLN: 10.0000 mg | Freq: Once | INTRAMUSCULAR | Status: DC

## 2023-06-07 NOTE — Progress Notes (Signed)
 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  This was followed by a PET CT scan which showed extensive FDG avid adenopathy in the neck chest abdomen and pelvis.  FDG avid splenic lesions.  Solitary intramuscular FDG avid lesion in the right biceps femoris muscle.   Supraclavicular excisional lymph node biopsy showed high-grade B-cell lymphoma germinal center type Ki-67 greater than 95%.  FISH testing was positive for Bcl-2 BCL6 and MYC consistent with triple hit lymphoma.  By NCCN IPI score would be 4 based on age and elevated LDH and stage IV (intramuscular biceps femoris lesion) which puts him in the high intermediate risk group. 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   Patient received RCHOP for cycle 1 with plans for [facility] with cycle 2. IT MTX for CNS prophylaxis and he has received 3 cycles but declined further cycles.  MRI brain negative for lymphoma.  Chemotherapy was not being able to escalated upwards due to neutropenia which has persisted to 3 weeks and he has been receiving treatment every 4  weeks   PET CT scan after 3 cycles of chemotherapy showedComplete or near complete metabolic response.  Residual disease in the anterior upper right mediastinum slightly greater than background mediastinal activity   Patient found to have a clinically palpable right supraclavicular lymph node which was subsequently biopsied and was consistent with previously diagnosed lymphoma.  PET CT scan showedNew right level 5 adenopathy in the lower neck with dominant lymph node 1.6 cm Deauville 5.  No other findings of acute active lymphoma in chest abdomen pelvis and bones.  MRI brain negative for leptomeningeal disease.   Patient seen for second opinion by Dr. Aviva Kluver at [facility] for consideration of CAR-T cell therapy.  Patient did not wish to proceed with CAR-T cell treatment after reviewing risks versus benefits.  Moreover patient's health insurance will also not cover CAR-T cell therapy. Patient started on second line tafasitamab/Revlimid regimen in June 2023.  He is on Revlimid 15 mg 3 weeks on and 1 week off.  Disease progression after 3 cycles and patient was switched to [facility] regimen     Patient was noted to haveSignificant decrease in the size of supraclavicular lymph nodes consistent with treatment response Deauville 3 after 3 cycles of Pola BR chemotherapy.  Patient has only received 4 cycles of chemotherapy so far due to persistent neutropenia despite receiving growth factor support.  Repeat PET scan showed new/recurrent hypermetabolic subcutaneous tissue in the high right back/inferior neck consistent with recurrent lymphoma Deauville 5.  He received palliative radiation to that area.  Patient was started on Zynlonta in June 2024 after he was found to have recurrent disease involving subcutaneous  right scapular region.  Patient desired treatment break and therefore treatment was held again in November 2024 when PET scan showed complete resolution of disease.  Interval history-patient has been doing well  overall.  Feels like his exertional shortness of breath is improving presently.  ECOG PS- 0 Pain scale- 0   Review of systems- Review of Systems  Constitutional:  Negative for chills, fever, malaise/fatigue and weight loss.  HENT:  Negative for congestion, ear discharge and nosebleeds.   Eyes:  Negative for blurred vision.  Respiratory:  Negative for cough, hemoptysis, sputum production, shortness of breath and wheezing.   Cardiovascular:  Negative for chest pain, palpitations, orthopnea and claudication.  Gastrointestinal:  Negative for abdominal pain, blood in stool, constipation, diarrhea, heartburn, melena, nausea and vomiting.  Genitourinary:  Negative for dysuria, flank pain, frequency, hematuria and urgency.  Musculoskeletal:  Negative for back pain, joint pain and myalgias.  Skin:  Negative for rash.  Neurological:  Negative for dizziness, tingling, focal weakness, seizures, weakness and headaches.  Endo/Heme/Allergies:  Does not bruise/bleed easily.  Psychiatric/Behavioral:  Negative for depression and suicidal ideas. The patient does not have insomnia.       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     Past Surgical History:  Procedure Laterality Date   BONE MARROW BIOPSY     COLONOSCOPY     EXCISION MASS NECK Right 06/12/2020   Procedure: EXCISION MASS NECK;  Surgeon: Leafy Ro, MD;  Location: [facility] ORS;  Service: General;  Laterality: Right;   HERNIA REPAIR Right    at age 70-RIH   63 CATH INSERTION     PORTACATH PLACEMENT Right 06/24/2020   Procedure: INSERTION PORT-A-CATH;  Surgeon: Leafy Ro, MD;  Location: [facility] ORS;  Service: General;  Laterality: Right;    Social History   Socioeconomic History   Marital status: Single    Spouse name: Not on file   Number of children: Not on file   Years of education: Not  on file   Highest education level: Not on file  Occupational History   Not on file  Tobacco Use   Smoking status: Former    Current packs/day: 0.00    Types: Cigarettes    Quit date: 06/13/2020    Years since quitting: 2.9   Smokeless tobacco: Never  Vaping Use   Vaping status: Never Used  Substance and Sexual Activity   Alcohol use: Not Currently   Drug use: Not Currently    Types: Marijuana   Sexual activity: Not Currently  Other Topics Concern   Not on file  Social History Narrative   Has daughter and grandson in the home   Social Drivers of Health   Financial Resource Strain: Medium Risk (07/30/2021)   Received from [facility]   Overall Financial Resource Strain (CARDIA)    Difficulty of Paying Living Expenses: Somewhat hard  Food Insecurity: No Food Insecurity (07/30/2021)   Received from [facility], [facility]   Hunger Vital Sign    Worried About Running Out of Food in the Last Year: Never true    Ran Out of Food in the Last Year: Never true  Transportation Needs: No Transportation Needs (07/30/2021)   Received from [facility], [facility]   [facility] - Transportation  Lack of Transportation (Medical): No    Lack of Transportation (Non-Medical): No  Physical Activity: Not on file  Stress: No Stress Concern Present (07/30/2021)   Received from [facility] - Occupational Stress Questionnaire    Feeling of Stress : Not at all  Social Connections: Not on file  Intimate Partner Violence: Not on file    Family History  Problem Relation Age of Onset   Anemia Mother    Hypertension Mother    Goiter Mother    Cancer Father    Cancer Sister    Multiple sclerosis Sister      Current Outpatient Medications:    aspirin EC 81 MG tablet, Take 1 tablet (81 mg total) by mouth daily. Swallow whole., Disp: 30 tablet, Rfl: 0   atenolol (TENORMIN) 25 MG tablet, Take 25 mg  by mouth daily. (Patient not taking: Reported on 05/31/2023), Disp: , Rfl:    cholecalciferol (VITAMIN D3) 25 MCG (1000 UNIT) tablet, Take 1,000 Units by mouth daily., Disp: , Rfl:    dexamethasone (DECADRON) 4 MG tablet, Take 1 tablet (4 mg) tablet at  8 am and  4pm day before chemo, day 2 take 1 tablet at 4 pm only, day 3 take 1 tablet in am and 4 pm. Need to eat before taking med. Repeat the above every 3 weeks, Disp: 10 tablet, Rfl: 3   diphenhydrAMINE (BENADRYL) 2 % cream, Apply 1 Application topically 3 (three) times daily as needed for itching., Disp: , Rfl:    diphenhydrAMINE (BENADRYL) 25 mg capsule, Take 25 mg by mouth every 6 (six) hours as needed for itching or allergies., Disp: , Rfl:    doxycycline (VIBRA-TABS) 100 MG tablet, Take 1 tablet (100 mg total) by mouth 2 (two) times daily., Disp: 14 tablet, Rfl: 0   lisinopril (ZESTRIL) 10 MG tablet, Take 10 mg by mouth daily., Disp: , Rfl:    Omega-3 Fatty Acids (FISH OIL ADULT GUMMIES PO), Take by mouth., Disp: , Rfl:    ondansetron (ZOFRAN) 8 MG tablet, Take 1 tablet (8 mg total) by mouth every 8 (eight) hours as needed for nausea or vomiting. (Patient not taking: Reported on 12/07/2022), Disp: 30 tablet, Rfl: 1   potassium chloride SA (KLOR-CON M) 20 MEQ tablet, Take 1 tablet (20 mEq total) by mouth daily., Disp: 21 tablet, Rfl: 0   predniSONE (DELTASONE) 10 MG tablet, Take 5 tablets (50 mg) by mouth once daily on Day 1. Then take 4 tablets (40 mg) on Day 2, then take 3 tablets (30 mg) on Day 3, then take 2 tablets (20 mg) on Day 4, then take 1 tablet (10 mg) on Day 5, then take 1/2 tablet (5 mg) on Day 6, then stop., Disp: 16 tablet, Rfl: 0   prochlorperazine (COMPAZINE) 10 MG tablet, Take 1 tablet (10 mg total) by mouth every 6 (six) hours as needed for nausea or vomiting., Disp: 30 tablet, Rfl: 1   Tenofovir Alafenamide Fumarate (VEMLIDY) 25 MG TABS, Take 1 tablet (25 mg total) by mouth daily. Take with food., Disp: 30 tablet, Rfl: 2    triamcinolone ointment (KENALOG) 0.5 %, Apply 1 Application topically 2 (two) times daily., Disp: 30 g, Rfl: 3   vitamin C (ASCORBIC ACID) 500 MG tablet, Take 500 mg by mouth daily., Disp: , Rfl:  No current facility-administered medications for this visit.  Facility-Administered Medications Ordered in Other Visits:    0.9 %  sodium chloride infusion, , Intravenous, Continuous, Earna Coder, MD, Stopped at 09/11/20 1502   heparin lock flush 100 unit/mL, 500 Units, Intravenous, Once, Creig Hines, MD   heparin lock flush 100 unit/mL, 500 Units, Intravenous, Once, Creig Hines, MD   methotrexate (PF) 12 mg in sodium chloride (PF) 0.9 % INTRATHECAL chemo injection, , Intrathecal, Once, Creig Hines, MD   sodium chloride flush (NS) 0.9 % injection 10 mL, 10 mL, Intracatheter, PRN, Creig Hines, MD, 10 mL at 01/18/23 1545   sodium chloride flush (NS) 0.9 % injection 10 mL, 10 mL, Intravenous, Once, Creig Hines, MD  Physical exam:  Vitals:   06/07/23 0833  BP: (!) 163/91  Pulse: 79  Resp: 18  Temp: (!) 96 F (35.6 C)  TempSrc: Tympanic  SpO2: 100%  Weight: 173 lb (78.5 kg)  Height: 6\' 1"  (1.854 m)   Physical Exam Cardiovascular:     Rate and Rhythm: Normal rate and regular rhythm.     Heart sounds: Normal heart sounds.  Pulmonary:     Effort: Pulmonary effort is normal.     Breath sounds: Normal breath sounds.  Skin:    General: Skin is warm and dry.  Neurological:     Mental Status: He is alert and oriented to person, place, and time.         Latest Ref Rng & Units 06/07/2023    8:17 AM  CMP  Glucose 70 - 99 mg/dL 696   BUN 8 - 23 mg/dL 8   Creatinine 2.95 - 2.84 mg/dL 1.32   Sodium 440 - 102 mmol/L 138   Potassium 3.5 - 5.1 mmol/L 3.5   Chloride 98 - 111 mmol/L 102   CO2 22 - 32 mmol/L 26   Calcium 8.9 - 10.3 mg/dL 8.8   Total Protein 6.5 - 8.1 g/dL 6.2   Total Bilirubin 0.0 - 1.2 mg/dL 0.7   Alkaline Phos 38 - 126 U/L 86   AST 15 - 41 U/L 36    ALT 0 - 44 U/L 22       Latest Ref Rng & Units 06/07/2023    8:17 AM  CBC  WBC 4.0 - 10.5 K/uL 2.4   Hemoglobin 13.0 - 17.0 g/dL 72.5   Hematocrit 36.6 - 52.0 % 36.1   Platelets 150 - 400 K/uL 161      Assessment and plan- Patient is a 63 y.o. male with history of relapsed triple hit diffuse large B-cell lymphoma here for on treatment assessment prior to next cycle of Zynlonta  Patient received 9 cycles of Zynlonta between June and November 2024 And following that he desired treatment break.  PET scan in December 2024 showed no evidence of recurrent lymphoma.  However patient was noted to have new subcutaneous masses in his right arm and therefore underwent repeat PET scan in March 2025.  I have reviewed the images independently although the results are not back yet.  There is reappearance of hypermetabolism in the subcutaneous right arm.  I am also noticing hypermetabolism in the right triceps muscle as well as focal spot noted in the liver and right femur.  These areas are concerning for recurrent lymphoma.  We are therefore proceeding with cycle 1 of Zynlonta today.  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  He will be taking Decadron a day prior as well as 2 days after Zynlonta.  I  will see him back in 3 weeks for cycle 2.  Plan to repeat PET scan after 4 cycles   Visit Diagnosis 1. Encounter for antineoplastic chemotherapy   2. High grade B-cell lymphoma (HCC)      Dr. Owens Shark, MD, MPH [facility] at [facility] [phone] 06/07/2023 9:04 AM

## 2023-06-07 NOTE — Progress Notes (Unsigned)
 After discussing plan with pt- he decided he was not comfortable getting treatment until CT scan results are back . Team made aware.  Will check my chart for updated appts

## 2023-06-08 ENCOUNTER — Encounter: Payer: Self-pay | Admitting: Oncology

## 2023-06-09 ENCOUNTER — Inpatient Hospital Stay

## 2023-06-10 ENCOUNTER — Ambulatory Visit: Admitting: Oncology

## 2023-06-13 ENCOUNTER — Inpatient Hospital Stay

## 2023-06-13 VITALS — BP 159/87 | HR 66 | Temp 96.2°F | Resp 18 | Wt 170.8 lb

## 2023-06-13 DIAGNOSIS — C8510 Unspecified B-cell lymphoma, unspecified site: Secondary | ICD-10-CM

## 2023-06-13 DIAGNOSIS — C8338 Diffuse large B-cell lymphoma, lymph nodes of multiple sites: Secondary | ICD-10-CM | POA: Diagnosis not present

## 2023-06-13 LAB — CBC WITH DIFFERENTIAL (CANCER CENTER ONLY)
Abs Immature Granulocytes: 0.23 10*3/uL — ABNORMAL HIGH (ref 0.00–0.07)
Basophils Absolute: 0 10*3/uL (ref 0.0–0.1)
Basophils Relative: 1 %
Eosinophils Absolute: 0 10*3/uL (ref 0.0–0.5)
Eosinophils Relative: 0 %
HCT: 37 % — ABNORMAL LOW (ref 39.0–52.0)
Hemoglobin: 12.3 g/dL — ABNORMAL LOW (ref 13.0–17.0)
Immature Granulocytes: 6 %
Lymphocytes Relative: 34 %
Lymphs Abs: 1.2 10*3/uL (ref 0.7–4.0)
MCH: 30.2 pg (ref 26.0–34.0)
MCHC: 33.2 g/dL (ref 30.0–36.0)
MCV: 90.9 fL (ref 80.0–100.0)
Monocytes Absolute: 0.4 10*3/uL (ref 0.1–1.0)
Monocytes Relative: 11 %
Neutro Abs: 1.8 10*3/uL (ref 1.7–7.7)
Neutrophils Relative %: 48 %
Platelet Count: 201 10*3/uL (ref 150–400)
RBC: 4.07 MIL/uL — ABNORMAL LOW (ref 4.22–5.81)
RDW: 15.1 % (ref 11.5–15.5)
Smear Review: NORMAL
WBC Count: 3.7 10*3/uL — ABNORMAL LOW (ref 4.0–10.5)
nRBC: 0 % (ref 0.0–0.2)

## 2023-06-13 LAB — CMP (CANCER CENTER ONLY)
ALT: 22 U/L (ref 0–44)
AST: 51 U/L — ABNORMAL HIGH (ref 15–41)
Albumin: 3.8 g/dL (ref 3.5–5.0)
Alkaline Phosphatase: 93 U/L (ref 38–126)
Anion gap: 10 (ref 5–15)
BUN: 15 mg/dL (ref 8–23)
CO2: 24 mmol/L (ref 22–32)
Calcium: 9.2 mg/dL (ref 8.9–10.3)
Chloride: 104 mmol/L (ref 98–111)
Creatinine: 0.83 mg/dL (ref 0.61–1.24)
GFR, Estimated: >60 mL/min (ref >60)
Glucose, Bld: 137 mg/dL — ABNORMAL HIGH (ref 70–99)
Potassium: 3.4 mmol/L — ABNORMAL LOW (ref 3.5–5.1)
Sodium: 138 mmol/L (ref 135–145)
Total Bilirubin: 0.6 mg/dL (ref 0.0–1.2)
Total Protein: 6.5 g/dL (ref 6.5–8.1)

## 2023-06-13 MED ORDER — HEPARIN SOD (PORK) LOCK FLUSH 100 UNIT/ML IV SOLN
500.0000 [IU] | Freq: Once | INTRAVENOUS | Status: DC | PRN
  Filled 2023-06-13: qty 5

## 2023-06-13 MED ORDER — DEXAMETHASONE SODIUM PHOSPHATE 10 MG/ML IJ SOLN
10.0000 mg | Freq: Once | INTRAMUSCULAR | Status: AC
  Administered 2023-06-13: 10 mg via INTRAVENOUS
  Filled 2023-06-13: qty 1

## 2023-06-13 MED ORDER — DEXTROSE 5 % IV SOLN
0.0750 mg/kg | Freq: Once | INTRAVENOUS | Status: AC
  Administered 2023-06-13: 6.1 mg via INTRAVENOUS
  Filled 2023-06-13: qty 1.22

## 2023-06-13 MED ORDER — DEXTROSE 5 % IV SOLN
Freq: Once | INTRAVENOUS | Status: AC
  Filled 2023-06-13: qty 250

## 2023-06-13 NOTE — Patient Instructions (Signed)
 [facility] - A DEPT OF [facility]  Discharge Instructions: Thank you for choosing [facility] to provide your oncology and hematology care.  If you have a lab appointment with the Cancer Center, please go directly to the Cancer Center and check in at the registration area.  Wear comfortable clothing and clothing appropriate for easy access to any Portacath or PICC line.   We strive to give you quality time with your provider. You may need to reschedule your appointment if you arrive late (15 or more minutes).  Arriving late affects you and other patients whose appointments are after yours.  Also, if you miss three or more appointments without notifying the office, you may be dismissed from the clinic at the provider's discretion.      For prescription refill requests, have your pharmacy contact our office and allow 72 hours for refills to be completed.    Today you received the following chemotherapy and/or immunotherapy agents Zynlonta      To help prevent nausea and vomiting after your treatment, we encourage you to take your nausea medication as directed.  BELOW ARE SYMPTOMS THAT SHOULD BE REPORTED IMMEDIATELY: *FEVER GREATER THAN 100.4 F (38 C) OR HIGHER *CHILLS OR SWEATING *NAUSEA AND VOMITING THAT IS NOT CONTROLLED WITH YOUR NAUSEA MEDICATION *UNUSUAL SHORTNESS OF BREATH *UNUSUAL BRUISING OR BLEEDING *URINARY PROBLEMS (pain or burning when urinating, or frequent urination) *BOWEL PROBLEMS (unusual diarrhea, constipation, pain near the anus) TENDERNESS IN MOUTH AND THROAT WITH OR WITHOUT PRESENCE OF ULCERS (sore throat, sores in mouth, or a toothache) UNUSUAL RASH, SWELLING OR PAIN  UNUSUAL VAGINAL DISCHARGE OR ITCHING   Items with * indicate a potential emergency and should be followed up as soon as possible or go to the Emergency Department if any problems should occur.  Please show the CHEMOTHERAPY ALERT CARD or IMMUNOTHERAPY  ALERT CARD at check-in to the Emergency Department and triage nurse.  Should you have questions after your visit or need to cancel or reschedule your appointment, please contact [facility] - A DEPT OF [facility]  [phone] and follow the prompts.  Office hours are 8:00 a.m. to 4:30 p.m. Monday - Friday. Please note that voicemails left after 4:00 p.m. may not be returned until the following business day.  We are closed weekends and major holidays. You have access to a nurse at all times for urgent questions. Please call the main number to the clinic [phone] and follow the prompts.  For any non-urgent questions, you may also contact your provider using MyChart. We now offer e-Visits for anyone 68 and older to request care online for non-urgent symptoms. For details visit [email or web address].   Also download the MyChart app! Go to the app store, search "MyChart", open the app, select , and log in with your MyChart username and password.

## 2023-06-15 ENCOUNTER — Inpatient Hospital Stay: Attending: Oncology

## 2023-06-15 DIAGNOSIS — Z8249 Family history of ischemic heart disease and other diseases of the circulatory system: Secondary | ICD-10-CM | POA: Insufficient documentation

## 2023-06-15 DIAGNOSIS — Z5112 Encounter for antineoplastic immunotherapy: Secondary | ICD-10-CM | POA: Insufficient documentation

## 2023-06-15 DIAGNOSIS — Z5986 Financial insecurity: Secondary | ICD-10-CM | POA: Diagnosis not present

## 2023-06-15 DIAGNOSIS — Z5189 Encounter for other specified aftercare: Secondary | ICD-10-CM | POA: Insufficient documentation

## 2023-06-15 DIAGNOSIS — Z7961 Long term (current) use of immunomodulator: Secondary | ICD-10-CM | POA: Diagnosis not present

## 2023-06-15 DIAGNOSIS — C8338 Diffuse large B-cell lymphoma, lymph nodes of multiple sites: Secondary | ICD-10-CM | POA: Diagnosis present

## 2023-06-15 DIAGNOSIS — Z87891 Personal history of nicotine dependence: Secondary | ICD-10-CM | POA: Diagnosis not present

## 2023-06-15 DIAGNOSIS — Z809 Family history of malignant neoplasm, unspecified: Secondary | ICD-10-CM | POA: Insufficient documentation

## 2023-06-15 DIAGNOSIS — Z8269 Family history of other diseases of the musculoskeletal system and connective tissue: Secondary | ICD-10-CM | POA: Diagnosis not present

## 2023-06-15 DIAGNOSIS — Z832 Family history of diseases of the blood and blood-forming organs and certain disorders involving the immune mechanism: Secondary | ICD-10-CM | POA: Insufficient documentation

## 2023-06-15 DIAGNOSIS — Z8349 Family history of other endocrine, nutritional and metabolic diseases: Secondary | ICD-10-CM | POA: Insufficient documentation

## 2023-06-15 DIAGNOSIS — C8510 Unspecified B-cell lymphoma, unspecified site: Secondary | ICD-10-CM

## 2023-06-15 DIAGNOSIS — Z79899 Other long term (current) drug therapy: Secondary | ICD-10-CM | POA: Insufficient documentation

## 2023-06-15 DIAGNOSIS — Z79631 Long term (current) use of antimetabolite agent: Secondary | ICD-10-CM | POA: Diagnosis not present

## 2023-06-15 MED ORDER — PEGFILGRASTIM-FPGK 6 MG/0.6ML ~~LOC~~ SOSY
6.0000 mg | PREFILLED_SYRINGE | Freq: Once | SUBCUTANEOUS | Status: AC
  Administered 2023-06-15: 6 mg via SUBCUTANEOUS
  Filled 2023-06-15: qty 0.6

## 2023-06-24 ENCOUNTER — Other Ambulatory Visit: Payer: Medicaid Other

## 2023-06-28 ENCOUNTER — Other Ambulatory Visit

## 2023-06-28 ENCOUNTER — Ambulatory Visit

## 2023-06-28 ENCOUNTER — Ambulatory Visit: Admitting: Oncology

## 2023-06-29 ENCOUNTER — Encounter: Payer: Self-pay | Admitting: Oncology

## 2023-06-30 ENCOUNTER — Ambulatory Visit

## 2023-07-04 ENCOUNTER — Inpatient Hospital Stay

## 2023-07-04 ENCOUNTER — Encounter: Payer: Self-pay | Admitting: Oncology

## 2023-07-04 ENCOUNTER — Inpatient Hospital Stay (HOSPITAL_BASED_OUTPATIENT_CLINIC_OR_DEPARTMENT_OTHER): Admitting: Oncology

## 2023-07-04 VITALS — BP 125/76 | HR 66 | Temp 98.0°F | Resp 18 | Ht 73.0 in | Wt 166.0 lb

## 2023-07-04 DIAGNOSIS — C8510 Unspecified B-cell lymphoma, unspecified site: Secondary | ICD-10-CM

## 2023-07-04 DIAGNOSIS — Z5112 Encounter for antineoplastic immunotherapy: Secondary | ICD-10-CM | POA: Diagnosis not present

## 2023-07-04 DIAGNOSIS — Z5111 Encounter for antineoplastic chemotherapy: Secondary | ICD-10-CM

## 2023-07-04 DIAGNOSIS — R7989 Other specified abnormal findings of blood chemistry: Secondary | ICD-10-CM | POA: Diagnosis not present

## 2023-07-04 LAB — CBC WITH DIFFERENTIAL (CANCER CENTER ONLY)
Abs Immature Granulocytes: 0.09 10*3/uL — ABNORMAL HIGH (ref 0.00–0.07)
Basophils Absolute: 0 10*3/uL (ref 0.0–0.1)
Basophils Relative: 0 %
Eosinophils Absolute: 0 10*3/uL (ref 0.0–0.5)
Eosinophils Relative: 1 %
HCT: 36.8 % — ABNORMAL LOW (ref 39.0–52.0)
Hemoglobin: 12.2 g/dL — ABNORMAL LOW (ref 13.0–17.0)
Immature Granulocytes: 2 %
Lymphocytes Relative: 15 %
Lymphs Abs: 0.8 10*3/uL (ref 0.7–4.0)
MCH: 30.6 pg (ref 26.0–34.0)
MCHC: 33.2 g/dL (ref 30.0–36.0)
MCV: 92.2 fL (ref 80.0–100.0)
Monocytes Absolute: 0.3 10*3/uL (ref 0.1–1.0)
Monocytes Relative: 6 %
Neutro Abs: 4 10*3/uL (ref 1.7–7.7)
Neutrophils Relative %: 76 %
Platelet Count: 113 10*3/uL — ABNORMAL LOW (ref 150–400)
RBC: 3.99 MIL/uL — ABNORMAL LOW (ref 4.22–5.81)
RDW: 17.1 % — ABNORMAL HIGH (ref 11.5–15.5)
WBC Count: 5.3 10*3/uL (ref 4.0–10.5)
nRBC: 0 % (ref 0.0–0.2)

## 2023-07-04 LAB — CMP (CANCER CENTER ONLY)
ALT: 20 U/L (ref 0–44)
AST: 43 U/L — ABNORMAL HIGH (ref 15–41)
Albumin: 3.7 g/dL (ref 3.5–5.0)
Alkaline Phosphatase: 97 U/L (ref 38–126)
Anion gap: 8 (ref 5–15)
BUN: 11 mg/dL (ref 8–23)
CO2: 24 mmol/L (ref 22–32)
Calcium: 9.1 mg/dL (ref 8.9–10.3)
Chloride: 106 mmol/L (ref 98–111)
Creatinine: 0.87 mg/dL (ref 0.61–1.24)
GFR, Estimated: >60 mL/min (ref >60)
Glucose, Bld: 136 mg/dL — ABNORMAL HIGH (ref 70–99)
Potassium: 3.7 mmol/L (ref 3.5–5.1)
Sodium: 138 mmol/L (ref 135–145)
Total Bilirubin: 0.9 mg/dL (ref 0.0–1.2)
Total Protein: 6.5 g/dL (ref 6.5–8.1)

## 2023-07-04 MED ORDER — DEXTROSE 5 % IV SOLN
Freq: Once | INTRAVENOUS | Status: AC
  Filled 2023-07-04: qty 250

## 2023-07-04 MED ORDER — LONCASTUXIMAB TESIRINE-LPYL CHEMO INJECTION 10 MG
0.0750 mg/kg | Freq: Once | INTRAVENOUS | Status: AC
  Administered 2023-07-04: 6.1 mg via INTRAVENOUS
  Filled 2023-07-04: qty 1.22

## 2023-07-04 MED ORDER — DEXAMETHASONE SODIUM PHOSPHATE 10 MG/ML IJ SOLN
10.0000 mg | Freq: Once | INTRAMUSCULAR | Status: AC
  Administered 2023-07-04: 10 mg via INTRAVENOUS
  Filled 2023-07-04: qty 1

## 2023-07-04 NOTE — Patient Instructions (Signed)
 [facility] - A DEPT OF [facility]  Discharge Instructions: Thank you for choosing [facility] to provide your oncology and hematology care.  If you have a lab appointment with the Cancer Center, please go directly to the Cancer Center and check in at the registration area.  Wear comfortable clothing and clothing appropriate for easy access to any Portacath or PICC line.   We strive to give you quality time with your provider. You may need to reschedule your appointment if you arrive late (15 or more minutes).  Arriving late affects you and other patients whose appointments are after yours.  Also, if you miss three or more appointments without notifying the office, you may be dismissed from the clinic at the provider's discretion.      For prescription refill requests, have your pharmacy contact our office and allow 72 hours for refills to be completed.    Today you received the following chemotherapy and/or immunotherapy agents Zynlonta      To help prevent nausea and vomiting after your treatment, we encourage you to take your nausea medication as directed.  BELOW ARE SYMPTOMS THAT SHOULD BE REPORTED IMMEDIATELY: *FEVER GREATER THAN 100.4 F (38 C) OR HIGHER *CHILLS OR SWEATING *NAUSEA AND VOMITING THAT IS NOT CONTROLLED WITH YOUR NAUSEA MEDICATION *UNUSUAL SHORTNESS OF BREATH *UNUSUAL BRUISING OR BLEEDING *URINARY PROBLEMS (pain or burning when urinating, or frequent urination) *BOWEL PROBLEMS (unusual diarrhea, constipation, pain near the anus) TENDERNESS IN MOUTH AND THROAT WITH OR WITHOUT PRESENCE OF ULCERS (sore throat, sores in mouth, or a toothache) UNUSUAL RASH, SWELLING OR PAIN  UNUSUAL VAGINAL DISCHARGE OR ITCHING   Items with * indicate a potential emergency and should be followed up as soon as possible or go to the Emergency Department if any problems should occur.  Please show the CHEMOTHERAPY ALERT CARD or IMMUNOTHERAPY  ALERT CARD at check-in to the Emergency Department and triage nurse.  Should you have questions after your visit or need to cancel or reschedule your appointment, please contact [facility] - A DEPT OF [facility]  [phone] and follow the prompts.  Office hours are 8:00 a.m. to 4:30 p.m. Monday - Friday. Please note that voicemails left after 4:00 p.m. may not be returned until the following business day.  We are closed weekends and major holidays. You have access to a nurse at all times for urgent questions. Please call the main number to the clinic [phone] and follow the prompts.  For any non-urgent questions, you may also contact your provider using MyChart. We now offer e-Visits for anyone 68 and older to request care online for non-urgent symptoms. For details visit [email or web address].   Also download the MyChart app! Go to the app store, search "MyChart", open the app, select , and log in with your MyChart username and password.

## 2023-07-04 NOTE — Progress Notes (Signed)
 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  This was followed by a PET CT scan which showed extensive FDG avid adenopathy in the neck chest abdomen and pelvis.  FDG avid splenic lesions.  Solitary intramuscular FDG avid lesion in the right biceps femoris muscle.   Supraclavicular excisional lymph node biopsy showed high-grade B-cell lymphoma germinal center type Ki-67 greater than 95%.  FISH testing was positive for Bcl-2 BCL6 and MYC consistent with triple hit lymphoma.  By NCCN IPI score would be 4 based on age and elevated LDH and stage IV (intramuscular biceps femoris lesion) which puts him in the high intermediate risk group. 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   Patient received RCHOP for cycle 1 with plans for DA [facility] with cycle 2. IT MTX for CNS prophylaxis and he has received 3 cycles but declined further cycles.  MRI brain negative for lymphoma.  Chemotherapy was not being able to escalated upwards due to neutropenia which has persisted to 3 weeks and he has been receiving treatment every 4  weeks   PET CT scan after 3 cycles of chemotherapy showedComplete or near complete metabolic response.  Residual disease in the anterior upper right mediastinum slightly greater than background mediastinal activity   Patient found to have a clinically palpable right supraclavicular lymph node which was subsequently biopsied and was consistent with previously diagnosed lymphoma.  PET CT scan showedNew right level 5 adenopathy in the lower neck with dominant lymph node 1.6 cm Deauville 5.  No other findings of acute active lymphoma in chest abdomen pelvis and bones.  MRI brain negative for leptomeningeal disease.   Patient seen for second opinion by Dr. Carlo Chessman at [facility] for consideration of CAR-T cell therapy.  Patient did not wish to proceed with CAR-T cell treatment after reviewing risks versus benefits.  Moreover patient's health insurance will also not cover CAR-T cell therapy. Patient started on second line tafasitamab /Revlimid  regimen in June 2023.  He is on Revlimid  15 mg 3 weeks on and 1 week off.  Disease progression after 3 cycles and patient was switched to [facility] regimen     Patient was noted to haveSignificant decrease in the size of supraclavicular lymph nodes consistent with treatment response Deauville 3 after 3 cycles of Pola BR chemotherapy.  Patient has only received 4 cycles of chemotherapy so far due to persistent neutropenia despite receiving growth factor support.  Repeat PET scan showed new/recurrent hypermetabolic subcutaneous tissue in the high right back/inferior neck consistent with recurrent lymphoma Deauville 5.  He received palliative radiation to that area.  Patient was started on Zynlonta  in June 2024 after he was found to have recurrent disease involving  subcutaneous right scapular region.  Patient desired treatment break and therefore treatment was held again in November 2024 when PET scan showed complete resolution of disease.  Treatment was restarted in March 2025 when he  had recurrent disease noted on PET scan    Interval history-overall he feels well and has tolerated Xeloda well without any significant side effects.  Appetite and weight have remained stable.  He is active and independent of his ADLs and IADLs.  A right arm lesion has remained similar in size  ECOG PS- 0 Pain scale- 0  Review of systems- Review of Systems  Constitutional:  Negative for chills, fever, malaise/fatigue and weight loss.  HENT:  Negative for congestion, ear discharge and nosebleeds.   Eyes:  Negative for blurred vision.  Respiratory:  Negative for cough, hemoptysis, sputum production, shortness of breath and wheezing.   Cardiovascular:  Negative for chest pain, palpitations, orthopnea and claudication.  Gastrointestinal:  Negative for abdominal pain, blood in stool, constipation, diarrhea, heartburn, melena, nausea and vomiting.  Genitourinary:  Negative for dysuria, flank pain, frequency, hematuria and urgency.  Musculoskeletal:  Negative for back pain, joint pain and myalgias.  Skin:  Negative for rash.  Neurological:  Negative for dizziness, tingling, focal weakness, seizures, weakness and headaches.  Endo/Heme/Allergies:  Does not bruise/bleed easily.  Psychiatric/Behavioral:  Negative for depression and suicidal ideas. The patient does not have insomnia.       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     Past Surgical History:  Procedure Laterality Date   BONE MARROW BIOPSY     COLONOSCOPY     EXCISION MASS NECK Right 06/12/2020   Procedure: EXCISION MASS NECK;  Surgeon: Alben Alma, MD;  Location: [facility] ORS;  Service: General;  Laterality: Right;   HERNIA REPAIR Right    at age 62-RIH   107 CATH INSERTION     PORTACATH PLACEMENT Right 06/24/2020   Procedure: INSERTION PORT-A-CATH;  Surgeon: Alben Alma, MD;   Location: [facility] ORS;  Service: General;  Laterality: Right;    Social History   Socioeconomic History   Marital status: Single    Spouse name: Not on file   Number of children: Not on file   Years of education: Not on file   Highest education level: Not on file  Occupational History   Not on file  Tobacco Use   Smoking status: Former    Current packs/day: 0.00    Types: Cigarettes    Quit date: 06/13/2020    Years since quitting: 3.0   Smokeless tobacco: Never  Vaping Use   Vaping status: Never Used  Substance and Sexual Activity   Alcohol use: Not Currently   Drug use: Not Currently    Types: Marijuana   Sexual activity: Not Currently  Other Topics Concern   Not on file  Social History Narrative   Has daughter and grandson in the home   Social Drivers of Health   Financial Resource Strain: Medium Risk (07/30/2021)   [facility], [facility]   Overall Financial Resource Strain (CARDIA)    Difficulty of Paying Living Expenses: Somewhat hard  Food Insecurity: No Food Insecurity (07/30/2021)   Received from [facility], [facility]   Hunger Vital Sign    Worried About Running Out of  Food in the Last Year: Never true    Ran Out of Food in the Last Year: Never true  Transportation Needs: No Transportation Needs (07/30/2021)   Received from [facility], [facility]   [facility] - Transportation    Lack of Transportation (Medical): No    Lack of Transportation (Non-Medical): No  Physical Activity: Not on file  Stress: No Stress Concern Present (07/30/2021)   Received from [facility], [facility] - Occupational Stress Questionnaire    Feeling of Stress : Not at all  Social Connections: Not on file  Intimate Partner Violence: Not on file    Family History  Problem Relation Age of Onset   Anemia Mother    Hypertension Mother    Goiter Mother    Cancer Father    Cancer Sister     Multiple sclerosis Sister      Current Outpatient Medications:    aspirin  EC 81 MG tablet, Take 1 tablet (81 mg total) by mouth daily. Swallow whole., Disp: 30 tablet, Rfl: 0   atenolol (TENORMIN) 25 MG tablet, Take 25 mg by mouth daily. (Patient not taking: Reported on 05/31/2023), Disp: , Rfl:    cholecalciferol (VITAMIN D3) 25 MCG (1000 UNIT) tablet, Take 1,000 Units by mouth daily., Disp: , Rfl:    diphenhydrAMINE  (BENADRYL ) 2 % cream, Apply 1 Application topically 3 (three) times daily as needed for itching., Disp: , Rfl:    diphenhydrAMINE  (BENADRYL ) 25 mg capsule, Take 25 mg by mouth every 6 (six) hours as needed for itching or allergies., Disp: , Rfl:    doxycycline  (VIBRA -TABS) 100 MG tablet, Take 1 tablet (100 mg total) by mouth 2 (two) times daily., Disp: 14 tablet, Rfl: 0   lisinopril (ZESTRIL) 10 MG tablet, Take 10 mg by mouth daily., Disp: , Rfl:    Omega-3 Fatty Acids (FISH OIL ADULT GUMMIES PO), Take by mouth., Disp: , Rfl:    ondansetron  (ZOFRAN ) 8 MG tablet, Take 1 tablet (8 mg total) by mouth every 8 (eight) hours as needed for nausea or vomiting. (Patient not taking: Reported on 12/07/2022), Disp: 30 tablet, Rfl: 1   potassium chloride  SA (KLOR-CON  M) 20 MEQ tablet, Take 1 tablet (20 mEq total) by mouth daily., Disp: 21 tablet, Rfl: 0   predniSONE  (DELTASONE ) 10 MG tablet, Take 5 tablets (50 mg) by mouth once daily on Day 1. Then take 4 tablets (40 mg) on Day 2, then take 3 tablets (30 mg) on Day 3, then take 2 tablets (20 mg) on Day 4, then take 1 tablet (10 mg) on Day 5, then take 1/2 tablet (5 mg) on Day 6, then stop., Disp: 16 tablet, Rfl: 0   prochlorperazine  (COMPAZINE ) 10 MG tablet, Take 1 tablet (10 mg total) by mouth every 6 (six) hours as needed for nausea or vomiting., Disp: 30 tablet, Rfl: 1   Tenofovir  Alafenamide Fumarate (VEMLIDY ) 25 MG TABS, Take 1 tablet (25 mg total) by mouth daily. Take with food., Disp: 30 tablet, Rfl: 2   triamcinolone  ointment (KENALOG ) 0.5  %, Apply 1 Application topically 2 (two) times daily., Disp: 30 g, Rfl: 3   vitamin C (ASCORBIC ACID) 500 MG tablet, Take 500 mg by mouth daily., Disp: , Rfl:  No current facility-administered medications for this visit.  Facility-Administered Medications Ordered in Other Visits:    0.9 %  sodium chloride  infusion, , Intravenous, Continuous, Brahmanday, Govinda R, MD, Stopped at 09/11/20 1502  heparin  lock flush 100 unit/mL, 500 Units, Intravenous, Once, Avonne Boettcher, MD   heparin  lock flush 100 unit/mL, 500 Units, Intravenous, Once, Avonne Boettcher, MD   methotrexate  (PF) 12 mg in sodium chloride  (PF) 0.9 % INTRATHECAL chemo injection, , Intrathecal, Once, Avonne Boettcher, MD   sodium chloride  flush (NS) 0.9 % injection 10 mL, 10 mL, Intracatheter, PRN, Avonne Boettcher, MD, 10 mL at 01/18/23 1545   sodium chloride  flush (NS) 0.9 % injection 10 mL, 10 mL, Intravenous, Once, Avonne Boettcher, MD  Physical exam: There were no vitals filed for this visit. Physical Exam Cardiovascular:     Rate and Rhythm: Normal rate and regular rhythm.     Heart sounds: Normal heart sounds.  Pulmonary:     Effort: Pulmonary effort is normal.     Breath sounds: Normal breath sounds.  Abdominal:     General: Bowel sounds are normal.     Palpations: Abdomen is soft.  Musculoskeletal:     Comments: Palpable right arm subcutaneous lesion roughly measuring 3 cm in size and appears similar as compared to last time  Skin:    General: Skin is warm and dry.  Neurological:     Mental Status: He is alert and oriented to person, place, and time.      I have personally reviewed labs listed below:    Latest Ref Rng & Units 07/04/2023    8:15 AM  CMP  Glucose 70 - 99 mg/dL 161   BUN 8 - 23 mg/dL 11   Creatinine 0.96 - 1.24 mg/dL 0.45   Sodium 409 - 811 mmol/L 138   Potassium 3.5 - 5.1 mmol/L 3.7   Chloride 98 - 111 mmol/L 106   CO2 22 - 32 mmol/L 24   Calcium 8.9 - 10.3 mg/dL 9.1   Total Protein 6.5 - 8.1  g/dL 6.5   Total Bilirubin 0.0 - 1.2 mg/dL 0.9   Alkaline Phos 38 - 126 U/L 97   AST 15 - 41 U/L 43   ALT 0 - 44 U/L 20       Latest Ref Rng & Units 07/04/2023    8:15 AM  CBC  WBC 4.0 - 10.5 K/uL 5.3   Hemoglobin 13.0 - 17.0 g/dL 91.4   Hematocrit 78.2 - 52.0 % 36.8   Platelets 150 - 400 K/uL 113     Assessment and plan- Patient is a 63 y.o. 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  He is here for on treatment assessment prior to next cycle of Zynlonta   Counts okay to proceed with cycle 11 of Zynlonta  today.  I will see him back in 3 weeks for cycle 12.  I will plan to repeat scans after 14 cycles.  Given that he had disease progression in 3 months of stopping Zynlonta  he will now continue Zynlonta   until progression or toxicity.  AST mildly elevated at 43.  Continue to monitor   Visit Diagnosis 1. Encounter for antineoplastic chemotherapy   2. High grade B-cell lymphoma (HCC)   3. Abnormal LFTs      Dr. Seretha Dance, MD, MPH [facility] [phone] 07/04/2023 8:29 AM

## 2023-07-06 ENCOUNTER — Inpatient Hospital Stay

## 2023-07-06 ENCOUNTER — Ambulatory Visit

## 2023-07-06 DIAGNOSIS — Z5112 Encounter for antineoplastic immunotherapy: Secondary | ICD-10-CM | POA: Diagnosis not present

## 2023-07-06 DIAGNOSIS — C8510 Unspecified B-cell lymphoma, unspecified site: Secondary | ICD-10-CM

## 2023-07-06 MED ORDER — PEGFILGRASTIM-FPGK 6 MG/0.6ML ~~LOC~~ SOSY
6.0000 mg | PREFILLED_SYRINGE | Freq: Once | SUBCUTANEOUS | Status: AC
  Administered 2023-07-06: 6 mg via SUBCUTANEOUS
  Filled 2023-07-06: qty 0.6

## 2023-07-15 ENCOUNTER — Other Ambulatory Visit: Payer: Self-pay

## 2023-07-15 MED ORDER — DIPHENHYDRAMINE HCL 2 % EX CREA: 1.0000 | TOPICAL_CREAM | Freq: Three times a day (TID) | CUTANEOUS | 3 refills | Status: DC | PRN

## 2023-07-18 ENCOUNTER — Other Ambulatory Visit: Payer: Self-pay

## 2023-07-18 MED ORDER — TRIAMCINOLONE ACETONIDE 0.5 % EX OINT: 1.0000 | TOPICAL_OINTMENT | Freq: Two times a day (BID) | CUTANEOUS | 3 refills | Status: DC

## 2023-07-25 ENCOUNTER — Inpatient Hospital Stay (HOSPITAL_BASED_OUTPATIENT_CLINIC_OR_DEPARTMENT_OTHER): Attending: Oncology | Admitting: Oncology

## 2023-07-25 ENCOUNTER — Encounter: Payer: Self-pay | Admitting: Oncology

## 2023-07-25 ENCOUNTER — Inpatient Hospital Stay

## 2023-07-25 ENCOUNTER — Inpatient Hospital Stay: Attending: Oncology

## 2023-07-25 VITALS — BP 157/79 | HR 65

## 2023-07-25 VITALS — BP 111/72 | HR 71 | Temp 97.7°F | Resp 18 | Wt 167.0 lb

## 2023-07-25 DIAGNOSIS — Z8269 Family history of other diseases of the musculoskeletal system and connective tissue: Secondary | ICD-10-CM | POA: Insufficient documentation

## 2023-07-25 DIAGNOSIS — Z79899 Other long term (current) drug therapy: Secondary | ICD-10-CM | POA: Diagnosis not present

## 2023-07-25 DIAGNOSIS — Z5112 Encounter for antineoplastic immunotherapy: Secondary | ICD-10-CM | POA: Diagnosis present

## 2023-07-25 DIAGNOSIS — D6959 Other secondary thrombocytopenia: Secondary | ICD-10-CM | POA: Diagnosis not present

## 2023-07-25 DIAGNOSIS — T451X5A Adverse effect of antineoplastic and immunosuppressive drugs, initial encounter: Secondary | ICD-10-CM | POA: Insufficient documentation

## 2023-07-25 DIAGNOSIS — C8510 Unspecified B-cell lymphoma, unspecified site: Secondary | ICD-10-CM | POA: Diagnosis not present

## 2023-07-25 DIAGNOSIS — Z8249 Family history of ischemic heart disease and other diseases of the circulatory system: Secondary | ICD-10-CM | POA: Diagnosis not present

## 2023-07-25 DIAGNOSIS — Z7961 Long term (current) use of immunomodulator: Secondary | ICD-10-CM | POA: Insufficient documentation

## 2023-07-25 DIAGNOSIS — Z809 Family history of malignant neoplasm, unspecified: Secondary | ICD-10-CM | POA: Diagnosis not present

## 2023-07-25 DIAGNOSIS — L739 Follicular disorder, unspecified: Secondary | ICD-10-CM | POA: Insufficient documentation

## 2023-07-25 DIAGNOSIS — Z5111 Encounter for antineoplastic chemotherapy: Secondary | ICD-10-CM

## 2023-07-25 DIAGNOSIS — R7989 Other specified abnormal findings of blood chemistry: Secondary | ICD-10-CM | POA: Diagnosis not present

## 2023-07-25 DIAGNOSIS — Z832 Family history of diseases of the blood and blood-forming organs and certain disorders involving the immune mechanism: Secondary | ICD-10-CM | POA: Diagnosis not present

## 2023-07-25 DIAGNOSIS — Z7952 Long term (current) use of systemic steroids: Secondary | ICD-10-CM | POA: Diagnosis not present

## 2023-07-25 DIAGNOSIS — Z5986 Financial insecurity: Secondary | ICD-10-CM | POA: Insufficient documentation

## 2023-07-25 DIAGNOSIS — Z8349 Family history of other endocrine, nutritional and metabolic diseases: Secondary | ICD-10-CM | POA: Diagnosis not present

## 2023-07-25 DIAGNOSIS — C8338 Diffuse large B-cell lymphoma, lymph nodes of multiple sites: Secondary | ICD-10-CM | POA: Diagnosis present

## 2023-07-25 DIAGNOSIS — Z87891 Personal history of nicotine dependence: Secondary | ICD-10-CM | POA: Insufficient documentation

## 2023-07-25 DIAGNOSIS — I10 Essential (primary) hypertension: Secondary | ICD-10-CM | POA: Diagnosis not present

## 2023-07-25 LAB — CBC WITH DIFFERENTIAL (CANCER CENTER ONLY)
Abs Immature Granulocytes: 0.13 10*3/uL — ABNORMAL HIGH (ref 0.00–0.07)
Basophils Absolute: 0 10*3/uL (ref 0.0–0.1)
Basophils Relative: 1 %
Eosinophils Absolute: 0 10*3/uL (ref 0.0–0.5)
Eosinophils Relative: 1 %
HCT: 37.2 % — ABNORMAL LOW (ref 39.0–52.0)
Hemoglobin: 12.7 g/dL — ABNORMAL LOW (ref 13.0–17.0)
Immature Granulocytes: 3 %
Lymphocytes Relative: 22 %
Lymphs Abs: 1.1 10*3/uL (ref 0.7–4.0)
MCH: 31.5 pg (ref 26.0–34.0)
MCHC: 34.1 g/dL (ref 30.0–36.0)
MCV: 92.3 fL (ref 80.0–100.0)
Monocytes Absolute: 0.4 10*3/uL (ref 0.1–1.0)
Monocytes Relative: 7 %
Neutro Abs: 3.3 10*3/uL (ref 1.7–7.7)
Neutrophils Relative %: 66 %
Platelet Count: 128 10*3/uL — ABNORMAL LOW (ref 150–400)
RBC: 4.03 MIL/uL — ABNORMAL LOW (ref 4.22–5.81)
RDW: 17.7 % — ABNORMAL HIGH (ref 11.5–15.5)
WBC Count: 4.9 10*3/uL (ref 4.0–10.5)
nRBC: 0 % (ref 0.0–0.2)

## 2023-07-25 LAB — CMP (CANCER CENTER ONLY)
ALT: 25 U/L (ref 0–44)
AST: 48 U/L — ABNORMAL HIGH (ref 15–41)
Albumin: 4.1 g/dL (ref 3.5–5.0)
Alkaline Phosphatase: 135 U/L — ABNORMAL HIGH (ref 38–126)
Anion gap: 9 (ref 5–15)
BUN: 11 mg/dL (ref 8–23)
CO2: 26 mmol/L (ref 22–32)
Calcium: 9.3 mg/dL (ref 8.9–10.3)
Chloride: 106 mmol/L (ref 98–111)
Creatinine: 0.84 mg/dL (ref 0.61–1.24)
GFR, Estimated: >60 mL/min (ref >60)
Glucose, Bld: 99 mg/dL (ref 70–99)
Potassium: 4.3 mmol/L (ref 3.5–5.1)
Sodium: 141 mmol/L (ref 135–145)
Total Bilirubin: 0.6 mg/dL (ref 0.0–1.2)
Total Protein: 6.8 g/dL (ref 6.5–8.1)

## 2023-07-25 MED ORDER — HEPARIN SOD (PORK) LOCK FLUSH 100 UNIT/ML IV SOLN
500.0000 [IU] | Freq: Once | INTRAVENOUS | Status: AC | PRN
Start: 2023-07-25 — End: 2023-07-25
  Administered 2023-07-25: 500 [IU]
  Filled 2023-07-25: qty 5

## 2023-07-25 MED ORDER — LONCASTUXIMAB TESIRINE-LPYL CHEMO INJECTION 10 MG
0.0750 mg/kg | Freq: Once | INTRAVENOUS | Status: AC
  Administered 2023-07-25: 6.1 mg via INTRAVENOUS
  Filled 2023-07-25: qty 1.22

## 2023-07-25 MED ORDER — DEXAMETHASONE SODIUM PHOSPHATE 10 MG/ML IJ SOLN
10.0000 mg | Freq: Once | INTRAMUSCULAR | Status: AC
  Administered 2023-07-25: 10 mg via INTRAVENOUS
  Filled 2023-07-25: qty 1

## 2023-07-25 MED ORDER — ALTEPLASE 2 MG IJ SOLR
2.0000 mg | Freq: Once | INTRAMUSCULAR | Status: AC | PRN
  Administered 2023-07-25: 2 mg
  Filled 2023-07-25: qty 2

## 2023-07-25 MED ORDER — DEXTROSE 5 % IV SOLN
Freq: Once | INTRAVENOUS | Status: AC
  Filled 2023-07-25: qty 250

## 2023-07-25 NOTE — Progress Notes (Signed)
 Patient is doing good, no new questions or concerns for the doctor today.

## 2023-07-25 NOTE — Patient Instructions (Signed)
 [facility] - A DEPT OF [facility]  Discharge Instructions: Thank you for choosing [facility] to provide your oncology and hematology care.  If you have a lab appointment with the Cancer Center, please go directly to the Cancer Center and check in at the registration area.  Wear comfortable clothing and clothing appropriate for easy access to any Portacath or PICC line.   We strive to give you quality time with your provider. You may need to reschedule your appointment if you arrive late (15 or more minutes).  Arriving late affects you and other patients whose appointments are after yours.  Also, if you miss three or more appointments without notifying the office, you may be dismissed from the clinic at the provider's discretion.      For prescription refill requests, have your pharmacy contact our office and allow 72 hours for refills to be completed.    Today you received the following chemotherapy and/or immunotherapy agents       To help prevent nausea and vomiting after your treatment, we encourage you to take your nausea medication as directed.  BELOW ARE SYMPTOMS THAT SHOULD BE REPORTED IMMEDIATELY: *FEVER GREATER THAN 100.4 F (38 C) OR HIGHER *CHILLS OR SWEATING *NAUSEA AND VOMITING THAT IS NOT CONTROLLED WITH YOUR NAUSEA MEDICATION *UNUSUAL SHORTNESS OF BREATH *UNUSUAL BRUISING OR BLEEDING *URINARY PROBLEMS (pain or burning when urinating, or frequent urination) *BOWEL PROBLEMS (unusual diarrhea, constipation, pain near the anus) TENDERNESS IN MOUTH AND THROAT WITH OR WITHOUT PRESENCE OF ULCERS (sore throat, sores in mouth, or a toothache) UNUSUAL RASH, SWELLING OR PAIN  UNUSUAL VAGINAL DISCHARGE OR ITCHING   Items with * indicate a potential emergency and should be followed up as soon as possible or go to the Emergency Department if any problems should occur.  Please show the CHEMOTHERAPY ALERT CARD or IMMUNOTHERAPY ALERT CARD  at check-in to the Emergency Department and triage nurse.  Should you have questions after your visit or need to cancel or reschedule your appointment, please contact [facility] - A DEPT OF [facility]  [phone] and follow the prompts.  Office hours are 8:00 a.m. to 4:30 p.m. Monday - Friday. Please note that voicemails left after 4:00 p.m. may not be returned until the following business day.  We are closed weekends and major holidays. You have access to a nurse at all times for urgent questions. Please call the main number to the clinic [phone] and follow the prompts.  For any non-urgent questions, you may also contact your provider using MyChart. We now offer e-Visits for anyone 9 and older to request care online for non-urgent symptoms. For details visit [email or web address].   Also download the MyChart app! Go to the app store, search "MyChart", open the app, select [facility], and log in with your MyChart username and password.  Loncastuximab Tesirine  Injection What is this medication? LONCASTUXIMAB TESIRINE  (LON kas TUX i mab TES ir een) treats lymphoma. It works by blocking a protein that causes cancer cells to grow and multiply. This helps to slow or stop the spread of cancer cells. This medicine may be used for other purposes; ask your health care provider or pharmacist if you have questions. COMMON BRAND NAME(S): ZYNLONTA  What should I tell my care team before I take this medication? They need to know if you have any of these conditions: Infection Liver disease An unusual or allergic reaction  to loncastuximab tesirine , other medications, foods, dyes, or preservatives If you or your partner are pregnant or trying to get pregnant Breast-feeding How should I use this medication? This medication is injected into a vein. It is given by your care team in a hospital or clinic setting. Talk to your care team about the use of this medication in  children. Special care may be needed. Overdosage: If you think you have taken too much of this medicine contact a poison control center or emergency room at once. NOTE: This medicine is only for you. Do not share this medicine with others. What if I miss a dose? Keep appointments for follow-up doses. It is important not to miss your dose. Call your care team if you are unable to keep an appointment. What may interact with this medication? Interactions have not been studied. This list may not describe all possible interactions. Give your health care provider a list of all the medicines, herbs, non-prescription drugs, or dietary supplements you use. Also tell them if you smoke, drink alcohol, or use illegal drugs. Some items may interact with your medicine. What should I watch for while using this medication? Your condition will be monitored carefully while you are receiving this medication. You may need blood work while taking this medication. This medication can make you more sensitive to the sun. Keep out of the sun. If you cannot avoid being in the sun, wear protective clothing and sunscreen. Do not use sun lamps, tanning beds, or tanning booths. Talk to your care team if you or your partner wish to become pregnant or think either of you might be pregnant. This medication can cause serious birth defects if taken during pregnancy and for 10 months after the last dose. A negative pregnancy test is required before starting this medication. A reliable form of contraception is recommended while taking this medication and for 10 months after the last dose. Talk to your care team about effective forms of contraception. Do not father a child while taking this medication and for 7 months after the last dose. Use a condom while having sex during this time period. Do not breastfeed while taking this medication and for 3 months after the last dose. This medication may cause infertility. Talk to your care team if  you are concerned about your fertility. What side effects may I notice from receiving this medication? Side effects that you should report to your care team as soon as possible: Allergic reactions--skin rash, itching, hives, swelling of the face, lips, tongue, or throat Infection--fever, chills, cough, sore throat, wounds that don't heal, pain or trouble when passing urine, general feeling of discomfort or being unwell Low red blood cell level--unusual weakness or fatigue, dizziness, headache, trouble breathing Skin reactions on sun-exposed areas Swelling of the ankles, hands, or feet, shortness of breath or trouble breathing, sudden weight gain Unusual bruising or bleeding Side effects that usually do not require medical attention (report to your care team if they continue or are bothersome): Fatigue Muscle pain Nausea Skin rash This list may not describe all possible side effects. Call your doctor for medical advice about side effects. You may report side effects to FDA at 1-800-FDA-1088. Where should I keep my medication? This medication is given in a hospital or clinic. It will not be stored at home. NOTE: This sheet is a summary. It may not cover all possible information. If you have questions about this medicine, talk to your doctor, pharmacist, or health care provider.  2024 Elsevier/Gold Standard (2021-07-07 00:00:00)

## 2023-07-25 NOTE — Progress Notes (Signed)
 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  This was followed by a PET CT scan which showed extensive FDG avid adenopathy in the neck chest abdomen and pelvis.  FDG avid splenic lesions.  Solitary intramuscular FDG avid lesion in the right biceps femoris muscle.   Supraclavicular excisional lymph node biopsy showed high-grade B-cell lymphoma germinal center type Ki-67 greater than 95%.  FISH testing was positive for Bcl-2 BCL6 and MYC consistent with triple hit lymphoma.  By NCCN IPI score would be 4 based on age and elevated LDH and stage IV (intramuscular biceps femoris lesion) which puts him in the high intermediate risk group. 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   Patient received RCHOP for cycle 1 with plans for DA [facility] with cycle 2. IT MTX for CNS prophylaxis and he has received 3 cycles but declined further cycles.  MRI brain negative for lymphoma.  Chemotherapy was not being able to escalated upwards due to neutropenia which has persisted to 3 weeks and he has been receiving treatment every 4  weeks   PET CT scan after 3 cycles of chemotherapy showedComplete or near complete metabolic response.  Residual disease in the anterior upper right mediastinum slightly greater than background mediastinal activity   Patient found to have a clinically palpable right supraclavicular lymph node which was subsequently biopsied and was consistent with previously diagnosed lymphoma.  PET CT scan showedNew right level 5 adenopathy in the lower neck with dominant lymph node 1.6 cm Deauville 5.  No other findings of acute active lymphoma in chest abdomen pelvis and bones.  MRI brain negative for leptomeningeal disease.   Patient seen for second opinion by Dr. Carlo Chessman at [facility] for consideration of CAR-T cell therapy.  Patient did not wish to proceed with CAR-T cell treatment after reviewing risks versus benefits.  Moreover patient's health insurance will also not cover CAR-T cell therapy. Patient started on second line tafasitamab /Revlimid  regimen in June 2023.  He is on Revlimid  15 mg 3 weeks on and 1 week off.  Disease progression after 3 cycles and patient was switched to [facility] regimen     Patient was noted to haveSignificant decrease in the size of supraclavicular lymph nodes consistent with treatment response Deauville 3 after 3 cycles of Pola BR chemotherapy.  Patient has only received 4 cycles of chemotherapy so far due to persistent neutropenia despite receiving growth factor support.  Repeat PET scan showed new/recurrent hypermetabolic subcutaneous tissue in the high right back/inferior neck consistent with recurrent lymphoma Deauville 5.  He received palliative radiation to that area.  Patient was started on Zynlonta  in June 2024 after he was found to have recurrent  disease involving subcutaneous right scapular region.  Patient desired treatment break and therefore treatment was held again in November 2024 when PET scan showed complete resolution of disease.  Treatment was restarted in March 2025 when he  had recurrent disease noted on PET scan  Interval history-tolerating treatments well so far.  He does get occasional areas of folliculitis especially over his bilateral forearms.  He has used topical steroids for the same in the past and has helped him.  States that overall he feels well and his appetite is good but he is losing weight slowly.  ECOG PS- 0 Pain scale- 0   Review of systems- Review of Systems  Constitutional:  Negative for chills, fever, malaise/fatigue and weight loss.  HENT:  Negative for congestion, ear discharge and nosebleeds.   Eyes:  Negative for blurred vision.  Respiratory:  Negative for cough, hemoptysis, sputum production, shortness of breath and wheezing.   Cardiovascular:  Negative for chest pain, palpitations, orthopnea and claudication.  Gastrointestinal:  Negative for abdominal pain, blood in stool, constipation, diarrhea, heartburn, melena, nausea and vomiting.  Genitourinary:  Negative for dysuria, flank pain, frequency, hematuria and urgency.  Musculoskeletal:  Negative for back pain, joint pain and myalgias.  Skin:  Negative for rash.  Neurological:  Negative for dizziness, tingling, focal weakness, seizures, weakness and headaches.  Endo/Heme/Allergies:  Does not bruise/bleed easily.  Psychiatric/Behavioral:  Negative for depression and suicidal ideas. The patient does not have insomnia.       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     Past Surgical History:  Procedure Laterality Date   BONE MARROW BIOPSY     COLONOSCOPY     EXCISION MASS NECK Right 06/12/2020   Procedure: EXCISION MASS NECK;  Surgeon: Alben Alma, MD;  Location: [facility] ORS;  Service: General;  Laterality: Right;   HERNIA REPAIR Right    at age 43-RIH   73 CATH INSERTION     PORTACATH PLACEMENT Right 06/24/2020   Procedure:  INSERTION PORT-A-CATH;  Surgeon: Alben Alma, MD;  Location: [facility] ORS;  Service: General;  Laterality: Right;    Social History   Socioeconomic History   Marital status: Single    Spouse name: Not on file   Number of children: Not on file   Years of education: Not on file   Highest education level: Not on file  Occupational History   Not on file  Tobacco Use   Smoking status: Former    Current packs/day: 0.00    Types: Cigarettes    Quit date: 06/13/2020    Years since quitting: 3.1   Smokeless tobacco: Never  Vaping Use   Vaping status: Never Used  Substance and Sexual Activity   Alcohol use: Not Currently   Drug use: Not Currently    Types: Marijuana   Sexual activity: Not Currently  Other Topics Concern   Not on file  Social History Narrative   Has daughter and grandson in the home   Social Drivers of Health   Financial Resource Strain: Medium Risk (07/30/2021)   Received from [facility]   Overall Financial Resource Strain (CARDIA)    Difficulty of Paying Living Expenses: Somewhat hard  Food Insecurity: No Food Insecurity (07/30/2021)   [facility], [facility]  Hunger Vital Sign    Worried About Running Out of Food in the Last Year: Never true    Ran Out of Food in the Last Year: Never true  Transportation Needs: No Transportation Needs (07/30/2021)   Received from [facility], [facility]   [facility] - Transportation    Lack of Transportation (Medical): No    Lack of Transportation (Non-Medical): No  Physical Activity: Not on file  Stress: No Stress Concern Present (07/30/2021)   Received from [facility], [facility] - Occupational Stress Questionnaire    Feeling of Stress : Not at all  Social Connections: Not on file  Intimate Partner Violence: Not on file    Family History  Problem Relation Age of Onset   Anemia Mother    Hypertension Mother    Goiter  Mother    Cancer Father    Cancer Sister    Multiple sclerosis Sister      Current Outpatient Medications:    aspirin  EC 81 MG tablet, Take 1 tablet (81 mg total) by mouth daily. Swallow whole., Disp: 30 tablet, Rfl: 0   atenolol (TENORMIN) 25 MG tablet, Take 25 mg by mouth daily., Disp: , Rfl:    cholecalciferol (VITAMIN D3) 25 MCG (1000 UNIT) tablet, Take 1,000 Units by mouth daily., Disp: , Rfl:    diphenhydrAMINE  (BENADRYL ) 2 % cream, Apply 1 Application topically 3 (three) times daily as needed for itching., Disp: 30 g, Rfl: 3   diphenhydrAMINE  (BENADRYL ) 25 mg capsule, Take 25 mg by mouth every 6 (six) hours as needed for itching or allergies., Disp: , Rfl:    doxycycline  (VIBRA -TABS) 100 MG tablet, Take 1 tablet (100 mg total) by mouth 2 (two) times daily., Disp: 14 tablet, Rfl: 0   lisinopril (ZESTRIL) 10 MG tablet, Take 10 mg by mouth daily., Disp: , Rfl:    Omega-3 Fatty Acids (FISH OIL ADULT GUMMIES PO), Take by mouth., Disp: , Rfl:    potassium chloride  SA (KLOR-CON  M) 20 MEQ tablet, Take 1 tablet (20 mEq total) by mouth daily., Disp: 21 tablet, Rfl: 0   predniSONE  (DELTASONE ) 10 MG tablet, Take 5 tablets (50 mg) by mouth once daily on Day 1. Then take 4 tablets (40 mg) on Day 2, then take 3 tablets (30 mg) on Day 3, then take 2 tablets (20 mg) on Day 4, then take 1 tablet (10 mg) on Day 5, then take 1/2 tablet (5 mg) on Day 6, then stop., Disp: 16 tablet, Rfl: 0   prochlorperazine  (COMPAZINE ) 10 MG tablet, Take 1 tablet (10 mg total) by mouth every 6 (six) hours as needed for nausea or vomiting., Disp: 30 tablet, Rfl: 1   Tenofovir  Alafenamide Fumarate (VEMLIDY ) 25 MG TABS, Take 1 tablet (25 mg total) by mouth daily. Take with food., Disp: 30 tablet, Rfl: 2   triamcinolone  ointment (KENALOG ) 0.5 %, Apply 1 Application topically 2 (two) times daily., Disp: 30 g, Rfl: 3   vitamin C (ASCORBIC ACID) 500 MG tablet, Take 500 mg by mouth daily., Disp: , Rfl:    ondansetron  (ZOFRAN ) 8 MG  tablet, Take 1 tablet (8 mg total) by mouth every 8 (eight) hours as needed for nausea or vomiting. (Patient not taking: Reported on 07/25/2023), Disp: 30 tablet, Rfl: 1 No current facility-administered medications for this visit.  Facility-Administered Medications Ordered in Other Visits:    0.9 %  sodium chloride  infusion, , Intravenous, Continuous, Valentine Gasmen, Govinda  R, MD, Stopped at 09/11/20 1502   heparin  lock flush 100 unit/mL, 500 Units, Intravenous, Once, Avonne Boettcher, MD   heparin  lock flush 100 unit/mL, 500 Units, Intravenous, Once, Avonne Boettcher, MD   methotrexate  (PF) 12 mg in sodium chloride  (PF) 0.9 % INTRATHECAL chemo injection, , Intrathecal, Once, Avonne Boettcher, MD   sodium chloride  flush (NS) 0.9 % injection 10 mL, 10 mL, Intracatheter, PRN, Avonne Boettcher, MD, 10 mL at 01/18/23 1545   sodium chloride  flush (NS) 0.9 % injection 10 mL, 10 mL, Intravenous, Once, Avonne Boettcher, MD  Physical exam:  Vitals:   07/25/23 0851  BP: 111/72  Pulse: 71  Resp: 18  Temp: 97.7 F (36.5 C)  TempSrc: Tympanic  SpO2: 100%  Weight: 167 lb (75.8 kg)   Physical Exam Cardiovascular:     Rate and Rhythm: Normal rate and regular rhythm.     Heart sounds: Normal heart sounds.  Pulmonary:     Effort: Pulmonary effort is normal.     Breath sounds: Normal breath sounds.  Abdominal:     General: Bowel sounds are normal.     Palpations: Abdomen is soft.  Musculoskeletal:     Comments: Subcutaneous lesions on the undersurface of the right arm appear to be decreasing in size  Skin:    General: Skin is warm and dry.  Neurological:     Mental Status: He is alert and oriented to person, place, and time.      I have personally reviewed labs listed below:    Latest Ref Rng & Units 07/25/2023    8:21 AM  CMP  Glucose 70 - 99 mg/dL 99   BUN 8 - 23 mg/dL 11   Creatinine 1.61 - 1.24 mg/dL 0.96   Sodium 045 - 409 mmol/L 141   Potassium 3.5 - 5.1 mmol/L 4.3   Chloride 98 - 111 mmol/L  106   CO2 22 - 32 mmol/L 26   Calcium 8.9 - 10.3 mg/dL 9.3   Total Protein 6.5 - 8.1 g/dL 6.8   Total Bilirubin 0.0 - 1.2 mg/dL 0.6   Alkaline Phos 38 - 126 U/L 135   AST 15 - 41 U/L 48   ALT 0 - 44 U/L 25       Latest Ref Rng & Units 07/25/2023    8:21 AM  CBC  WBC 4.0 - 10.5 K/uL 4.9   Hemoglobin 13.0 - 17.0 g/dL 81.1   Hematocrit 91.4 - 52.0 % 37.2   Platelets 150 - 400 K/uL 128      Assessment and plan- Patient is a 63 y.o. male with history of relapsed triple hit diffuse large B-cell lymphoma here for on treatment assessment prior to cycle 12 of Zynlonta   Counts okay to proceed with cycle 12 of Zynlonta  today with growth factor support.  I will see him back in 3 weeks for cycle 13.  I will repeat scans after 15 cycles.  Abnormal LFTs: AST mildly elevated at 48.  Continue to monitor. Patient has a prior history of core antibody positive for hepatitis B but has not been consistent with his tenofovir   Chemo-induced thrombocytopenia: Overall mild continue to monitor   Visit Diagnosis 1. High grade B-cell lymphoma (HCC)   2. Encounter for antineoplastic chemotherapy   3. Chemotherapy-induced thrombocytopenia   4. Abnormal LFTs      Dr. Seretha Dance, MD, MPH [facility] at [facility] [phone] 07/25/2023 1:00 PM

## 2023-07-27 ENCOUNTER — Encounter: Payer: Self-pay | Admitting: Oncology

## 2023-07-27 ENCOUNTER — Inpatient Hospital Stay

## 2023-07-27 ENCOUNTER — Telehealth: Payer: Self-pay | Admitting: *Deleted

## 2023-07-27 DIAGNOSIS — Z5112 Encounter for antineoplastic immunotherapy: Secondary | ICD-10-CM | POA: Diagnosis not present

## 2023-07-27 DIAGNOSIS — C8510 Unspecified B-cell lymphoma, unspecified site: Secondary | ICD-10-CM

## 2023-07-27 MED ORDER — PEGFILGRASTIM-FPGK 6 MG/0.6ML ~~LOC~~ SOSY
6.0000 mg | PREFILLED_SYRINGE | Freq: Once | SUBCUTANEOUS | Status: AC
  Administered 2023-07-27: 6 mg via SUBCUTANEOUS
  Filled 2023-07-27: qty 0.6

## 2023-07-27 NOTE — Telephone Encounter (Signed)
 Patient arrived late and received his injection.

## 2023-07-27 NOTE — Telephone Encounter (Signed)
 Patient no showed for his Stimufend  injection this morning. Attempted to reach patient. Left vm. I reached out to pt's daughter. She will try and get in touch with him.

## 2023-08-15 ENCOUNTER — Encounter: Payer: Self-pay | Admitting: Oncology

## 2023-08-15 ENCOUNTER — Inpatient Hospital Stay: Attending: Oncology

## 2023-08-15 ENCOUNTER — Inpatient Hospital Stay

## 2023-08-15 ENCOUNTER — Inpatient Hospital Stay (HOSPITAL_BASED_OUTPATIENT_CLINIC_OR_DEPARTMENT_OTHER): Admitting: Oncology

## 2023-08-15 VITALS — BP 143/83 | HR 58 | Temp 98.5°F | Resp 19 | Ht 73.0 in | Wt 169.2 lb

## 2023-08-15 VITALS — BP 155/82 | HR 63 | Resp 18

## 2023-08-15 DIAGNOSIS — C8510 Unspecified B-cell lymphoma, unspecified site: Secondary | ICD-10-CM

## 2023-08-15 DIAGNOSIS — Z79899 Other long term (current) drug therapy: Secondary | ICD-10-CM | POA: Insufficient documentation

## 2023-08-15 DIAGNOSIS — Z7982 Long term (current) use of aspirin: Secondary | ICD-10-CM | POA: Diagnosis not present

## 2023-08-15 DIAGNOSIS — Z5986 Financial insecurity: Secondary | ICD-10-CM | POA: Diagnosis not present

## 2023-08-15 DIAGNOSIS — Z5111 Encounter for antineoplastic chemotherapy: Secondary | ICD-10-CM

## 2023-08-15 DIAGNOSIS — Z7961 Long term (current) use of immunomodulator: Secondary | ICD-10-CM | POA: Diagnosis not present

## 2023-08-15 DIAGNOSIS — Z8249 Family history of ischemic heart disease and other diseases of the circulatory system: Secondary | ICD-10-CM | POA: Diagnosis not present

## 2023-08-15 DIAGNOSIS — Z832 Family history of diseases of the blood and blood-forming organs and certain disorders involving the immune mechanism: Secondary | ICD-10-CM | POA: Insufficient documentation

## 2023-08-15 DIAGNOSIS — Z79631 Long term (current) use of antimetabolite agent: Secondary | ICD-10-CM | POA: Diagnosis not present

## 2023-08-15 DIAGNOSIS — M7989 Other specified soft tissue disorders: Secondary | ICD-10-CM | POA: Diagnosis not present

## 2023-08-15 DIAGNOSIS — D696 Thrombocytopenia, unspecified: Secondary | ICD-10-CM | POA: Diagnosis not present

## 2023-08-15 DIAGNOSIS — Z809 Family history of malignant neoplasm, unspecified: Secondary | ICD-10-CM | POA: Insufficient documentation

## 2023-08-15 DIAGNOSIS — Z8349 Family history of other endocrine, nutritional and metabolic diseases: Secondary | ICD-10-CM | POA: Insufficient documentation

## 2023-08-15 DIAGNOSIS — Z8269 Family history of other diseases of the musculoskeletal system and connective tissue: Secondary | ICD-10-CM | POA: Insufficient documentation

## 2023-08-15 DIAGNOSIS — Z79624 Long term (current) use of inhibitors of nucleotide synthesis: Secondary | ICD-10-CM | POA: Insufficient documentation

## 2023-08-15 DIAGNOSIS — Z5189 Encounter for other specified aftercare: Secondary | ICD-10-CM | POA: Diagnosis not present

## 2023-08-15 DIAGNOSIS — C8338 Diffuse large B-cell lymphoma, lymph nodes of multiple sites: Secondary | ICD-10-CM | POA: Insufficient documentation

## 2023-08-15 DIAGNOSIS — I10 Essential (primary) hypertension: Secondary | ICD-10-CM | POA: Insufficient documentation

## 2023-08-15 DIAGNOSIS — R7989 Other specified abnormal findings of blood chemistry: Secondary | ICD-10-CM | POA: Diagnosis not present

## 2023-08-15 DIAGNOSIS — Z5112 Encounter for antineoplastic immunotherapy: Secondary | ICD-10-CM | POA: Diagnosis present

## 2023-08-15 DIAGNOSIS — Z87891 Personal history of nicotine dependence: Secondary | ICD-10-CM | POA: Insufficient documentation

## 2023-08-15 LAB — CBC WITH DIFFERENTIAL (CANCER CENTER ONLY)
Abs Immature Granulocytes: 0.16 10*3/uL — ABNORMAL HIGH (ref 0.00–0.07)
Basophils Absolute: 0.1 10*3/uL (ref 0.0–0.1)
Basophils Relative: 1 %
Eosinophils Absolute: 0 10*3/uL (ref 0.0–0.5)
Eosinophils Relative: 0 %
HCT: 35.8 % — ABNORMAL LOW (ref 39.0–52.0)
Hemoglobin: 12.1 g/dL — ABNORMAL LOW (ref 13.0–17.0)
Immature Granulocytes: 2 %
Lymphocytes Relative: 19 %
Lymphs Abs: 1.4 10*3/uL (ref 0.7–4.0)
MCH: 32.2 pg (ref 26.0–34.0)
MCHC: 33.8 g/dL (ref 30.0–36.0)
MCV: 95.2 fL (ref 80.0–100.0)
Monocytes Absolute: 0.7 10*3/uL (ref 0.1–1.0)
Monocytes Relative: 10 %
Neutro Abs: 5.1 10*3/uL (ref 1.7–7.7)
Neutrophils Relative %: 68 %
Platelet Count: 127 10*3/uL — ABNORMAL LOW (ref 150–400)
RBC: 3.76 MIL/uL — ABNORMAL LOW (ref 4.22–5.81)
RDW: 16.5 % — ABNORMAL HIGH (ref 11.5–15.5)
WBC Count: 7.4 10*3/uL (ref 4.0–10.5)
nRBC: 0 % (ref 0.0–0.2)

## 2023-08-15 LAB — CMP (CANCER CENTER ONLY)
ALT: 25 U/L (ref 0–44)
AST: 56 U/L — ABNORMAL HIGH (ref 15–41)
Albumin: 3.9 g/dL (ref 3.5–5.0)
Alkaline Phosphatase: 120 U/L (ref 38–126)
Anion gap: 10 (ref 5–15)
BUN: 12 mg/dL (ref 8–23)
CO2: 24 mmol/L (ref 22–32)
Calcium: 9.2 mg/dL (ref 8.9–10.3)
Chloride: 105 mmol/L (ref 98–111)
Creatinine: 0.73 mg/dL (ref 0.61–1.24)
GFR, Estimated: >60 mL/min (ref >60)
Glucose, Bld: 101 mg/dL — ABNORMAL HIGH (ref 70–99)
Potassium: 4.1 mmol/L (ref 3.5–5.1)
Sodium: 139 mmol/L (ref 135–145)
Total Bilirubin: 0.5 mg/dL (ref 0.0–1.2)
Total Protein: 7 g/dL (ref 6.5–8.1)

## 2023-08-15 MED ORDER — DEXAMETHASONE SODIUM PHOSPHATE 10 MG/ML IJ SOLN
10.0000 mg | Freq: Once | INTRAMUSCULAR | Status: AC
  Administered 2023-08-15: 10 mg via INTRAVENOUS

## 2023-08-15 MED ORDER — DEXTROSE 5 % IV SOLN
Freq: Once | INTRAVENOUS | Status: AC
  Filled 2023-08-15: qty 250

## 2023-08-15 MED ORDER — HEPARIN SOD (PORK) LOCK FLUSH 100 UNIT/ML IV SOLN
500.0000 [IU] | Freq: Once | INTRAVENOUS | Status: AC | PRN
  Administered 2023-08-15: 500 [IU]
  Filled 2023-08-15: qty 5

## 2023-08-15 MED ORDER — LONCASTUXIMAB TESIRINE-LPYL CHEMO INJECTION 10 MG
0.0750 mg/kg | Freq: Once | INTRAVENOUS | Status: AC
  Administered 2023-08-15: 6.1 mg via INTRAVENOUS
  Filled 2023-08-15: qty 1.22

## 2023-08-15 NOTE — Progress Notes (Signed)
 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  This was followed by a PET CT scan which showed extensive FDG avid adenopathy in the neck chest abdomen and pelvis.  FDG avid splenic lesions.  Solitary intramuscular FDG avid lesion in the right biceps femoris muscle.   Supraclavicular excisional lymph node biopsy showed high-grade B-cell lymphoma germinal center type Ki-67 greater than 95%.  FISH testing was positive for Bcl-2 BCL6 and MYC consistent with triple hit lymphoma.  By NCCN IPI score would be 4 based on age and elevated LDH and stage IV (intramuscular biceps femoris lesion) which puts him in the high intermediate risk group. 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   Patient received RCHOP for cycle 1 with plans for DA [facility] with cycle 2. IT MTX for CNS prophylaxis and he has received 3 cycles but declined further cycles.  MRI brain negative for lymphoma.  Chemotherapy was not being able to escalated upwards due to neutropenia which has persisted to 3 weeks and he has been receiving treatment every 4  weeks   PET CT scan after 3 cycles of chemotherapy showedComplete or near complete metabolic response.  Residual disease in the anterior upper right mediastinum slightly greater than background mediastinal activity   Patient found to have a clinically palpable right supraclavicular lymph node which was subsequently biopsied and was consistent with previously diagnosed lymphoma.  PET CT scan showedNew right level 5 adenopathy in the lower neck with dominant lymph node 1.6 cm Deauville 5.  No other findings of acute active lymphoma in chest abdomen pelvis and bones.  MRI brain negative for leptomeningeal disease.   Patient seen for second opinion by Dr. Carlo Chessman at [facility] for consideration of CAR-T cell therapy.  Patient did not wish to proceed with CAR-T cell treatment after reviewing risks versus benefits.  Moreover patient's health insurance will also not cover CAR-T cell therapy. Patient started on second line tafasitamab /Revlimid  regimen in June 2023.  He is on Revlimid  15 mg 3 weeks on and 1 week off.  Disease progression after 3 cycles and patient was switched to [facility] regimen     Patient was noted to haveSignificant decrease in the size of supraclavicular lymph nodes consistent with treatment response Deauville 3 after 3 cycles of Pola BR chemotherapy.  Patient has only received 4 cycles of chemotherapy so far due to persistent neutropenia despite receiving growth factor support.  Repeat PET scan showed new/recurrent hypermetabolic subcutaneous tissue in the high right back/inferior neck consistent with recurrent lymphoma Deauville 5.  He received palliative radiation to that area.  Patient was started on Zynlonta  in June 2024 after he was found to have recurrent disease  involving subcutaneous right scapular region.  Patient desired treatment break and therefore treatment was held again in November 2024 when PET scan showed complete resolution of disease.  Treatment was restarted in March 2025 when he  had recurrent disease noted on PET scan    Interval history-he feels well overall.  Weight has remained stable in the last 2 months.  Denies any pain presently  ECOG PS- 0 Pain scale- 0   Review of systems- Review of Systems  Constitutional:  Negative for chills, fever, malaise/fatigue and weight loss.  HENT:  Negative for congestion, ear discharge and nosebleeds.   Eyes:  Negative for blurred vision.  Respiratory:  Negative for cough, hemoptysis, sputum production, shortness of breath and wheezing.   Cardiovascular:  Negative for chest pain, palpitations, orthopnea and claudication.  Gastrointestinal:  Negative for abdominal pain, blood in stool, constipation, diarrhea, heartburn, melena, nausea and vomiting.  Genitourinary:  Negative for dysuria, flank pain, frequency, hematuria and urgency.  Musculoskeletal:  Negative for back pain, joint pain and myalgias.  Skin:  Negative for rash.  Neurological:  Negative for dizziness, tingling, focal weakness, seizures, weakness and headaches.  Endo/Heme/Allergies:  Does not bruise/bleed easily.  Psychiatric/Behavioral:  Negative for depression and suicidal ideas. The patient does not have insomnia.       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     Past Surgical History:  Procedure Laterality Date   BONE MARROW BIOPSY     COLONOSCOPY     EXCISION MASS NECK Right 06/12/2020   Procedure: EXCISION MASS NECK;  Surgeon: Alben Alma, MD;  Location: [facility] ORS;  Service: General;  Laterality: Right;   HERNIA REPAIR Right    at age 37-RIH   56 CATH INSERTION     PORTACATH PLACEMENT Right 06/24/2020   Procedure: INSERTION PORT-A-CATH;  Surgeon: Alben Alma, MD;  Location: [facility] ORS;  Service: General;  Laterality: Right;    Social History   Socioeconomic History   Marital status: Single     Spouse name: Not on file   Number of children: Not on file   Years of education: Not on file   Highest education level: Not on file  Occupational History   Not on file  Tobacco Use   Smoking status: Former    Current packs/day: 0.00    Types: Cigarettes    Quit date: 06/13/2020    Years since quitting: 3.1   Smokeless tobacco: Never  Vaping Use   Vaping status: Never Used  Substance and Sexual Activity   Alcohol use: Not Currently   Drug use: Not Currently    Types: Marijuana   Sexual activity: Not Currently  Other Topics Concern   Not on file  Social History Narrative   Has daughter and grandson in the home   Social Drivers of Health   Financial Resource Strain: Medium Risk (07/30/2021)   Received [facility], [facility]   Overall Financial Resource Strain (CARDIA)    Difficulty of Paying Living Expenses: Somewhat hard  Food Insecurity: No Food Insecurity (07/30/2021)   Received from [facility], [facility]   Hunger Vital Sign    Worried About Running Out of Food in the Last Year: Never true    Ran Out of Food in the Last Year: Never true  Transportation Needs: No Transportation Needs (07/30/2021)   Received from [facility], [facility]   [facility] - Transportation    Lack of Transportation (Medical): No    Lack of Transportation (Non-Medical): No  Physical Activity: Not on file  Stress: No Stress Concern Present (07/30/2021)   Received from [facility], [facility] - Occupational Stress Questionnaire    Feeling of Stress : Not at all  Social Connections: Not on file  Intimate Partner Violence: Not on file    Family History  Problem Relation Age of Onset   Anemia Mother    Hypertension Mother    Goiter Mother    Cancer Father    Cancer Sister    Multiple sclerosis Sister      Current Outpatient Medications:    aspirin  EC 81 MG tablet, Take 1 tablet (81 mg total) by mouth daily.  Swallow whole., Disp: 30 tablet, Rfl: 0   atenolol (TENORMIN) 25 MG tablet, Take 25 mg by mouth daily., Disp: , Rfl:    cholecalciferol (VITAMIN D3) 25 MCG (1000 UNIT) tablet, Take 1,000 Units by mouth daily., Disp: , Rfl:    diphenhydrAMINE  (BENADRYL ) 2 % cream, Apply 1 Application topically 3 (three) times daily as needed for itching., Disp: 30 g, Rfl: 3   diphenhydrAMINE  (BENADRYL ) 25 mg capsule, Take 25 mg by mouth every 6 (six) hours as needed for itching or allergies., Disp: , Rfl:    doxycycline  (VIBRA -TABS) 100 MG tablet, Take 1 tablet (100 mg total) by mouth 2 (two) times daily., Disp: 14 tablet, Rfl: 0   lisinopril (ZESTRIL) 10 MG tablet, Take 10 mg by mouth daily., Disp: , Rfl:    Omega-3 Fatty Acids (FISH OIL ADULT GUMMIES PO), Take by mouth., Disp: , Rfl:    ondansetron  (ZOFRAN ) 8 MG tablet, Take 1 tablet (8 mg total) by mouth every 8 (eight) hours as needed for nausea or vomiting. (Patient not taking: Reported on 07/25/2023), Disp: 30 tablet, Rfl: 1   potassium chloride  SA (KLOR-CON  M) 20 MEQ tablet, Take 1 tablet (20 mEq total) by mouth daily., Disp: 21 tablet, Rfl: 0   predniSONE  (DELTASONE ) 10 MG tablet, Take 5 tablets (50 mg) by mouth once daily on Day 1. Then take 4 tablets (40 mg) on Day 2, then take 3 tablets (30 mg) on Day 3, then take 2 tablets (20 mg) on Day 4, then take 1 tablet (10 mg) on Day 5, then take 1/2 tablet (5 mg) on Day 6, then stop., Disp: 16 tablet, Rfl: 0   prochlorperazine  (COMPAZINE ) 10 MG tablet, Take 1 tablet (10 mg total) by mouth every 6 (six) hours as needed for nausea or vomiting., Disp: 30 tablet, Rfl: 1   Tenofovir  Alafenamide Fumarate (VEMLIDY ) 25 MG TABS, Take 1 tablet (25 mg total) by mouth daily. Take with food., Disp: 30 tablet, Rfl: 2   triamcinolone  ointment (KENALOG ) 0.5 %, Apply 1 Application topically 2 (two) times daily., Disp: 30 g, Rfl: 3   vitamin C (ASCORBIC ACID) 500 MG tablet, Take 500 mg by mouth daily., Disp: , Rfl:  No current  facility-administered medications for this visit.  Facility-Administered Medications Ordered in Other Visits:    0.9 %  sodium chloride  infusion, , Intravenous, Continuous, Brahmanday, Govinda R, MD, Stopped at 09/11/20 1502   heparin  lock flush 100 unit/mL, 500 Units, Intravenous, Once, Avonne Boettcher, MD   heparin  lock flush 100 unit/mL, 500 Units, Intravenous, Once,  Avonne Boettcher, MD   methotrexate  (PF) 12 mg in sodium chloride  (PF) 0.9 % INTRATHECAL chemo injection, , Intrathecal, Once, Avonne Boettcher, MD   sodium chloride  flush (NS) 0.9 % injection 10 mL, 10 mL, Intracatheter, PRN, Avonne Boettcher, MD, 10 mL at 01/18/23 1545   sodium chloride  flush (NS) 0.9 % injection 10 mL, 10 mL, Intravenous, Once, Avonne Boettcher, MD  Physical exam:  Vitals:   08/15/23 0847  BP: (!) 143/83  Pulse: (!) 58  Resp: 19  Temp: 98.5 F (36.9 C)  TempSrc: Tympanic  SpO2: 100%  Weight: 169 lb 3.2 oz (76.7 kg)  Height: 6\' 1"  (1.854 m)   Physical Exam Cardiovascular:     Rate and Rhythm: Normal rate and regular rhythm.     Heart sounds: Normal heart sounds.  Pulmonary:     Effort: Pulmonary effort is normal.     Breath sounds: Normal breath sounds.  Skin:    General: Skin is warm and dry.  Neurological:     Mental Status: He is alert and oriented to person, place, and time.      I have personally reviewed labs listed below:    Latest Ref Rng & Units 08/15/2023    8:21 AM  CMP  Glucose 70 - 99 mg/dL 409   BUN 8 - 23 mg/dL 12   Creatinine 8.11 - 1.24 mg/dL 9.14   Sodium 782 - 956 mmol/L 139   Potassium 3.5 - 5.1 mmol/L 4.1   Chloride 98 - 111 mmol/L 105   CO2 22 - 32 mmol/L 24   Calcium 8.9 - 10.3 mg/dL 9.2   Total Protein 6.5 - 8.1 g/dL 7.0   Total Bilirubin 0.0 - 1.2 mg/dL 0.5   Alkaline Phos 38 - 126 U/L 120   AST 15 - 41 U/L 56   ALT 0 - 44 U/L 25       Latest Ref Rng & Units 08/15/2023    8:21 AM  CBC  WBC 4.0 - 10.5 K/uL 7.4   Hemoglobin 13.0 - 17.0 g/dL 21.3   Hematocrit  08.6 - 52.0 % 35.8   Platelets 150 - 400 K/uL 127     Assessment and plan- Patient is a 63 y.o. 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  He is here for on treatment assessment prior to cycle 13 of Zynlonta   Counts okay to proceed with cycle 13 of Zynlonta  today.  I will see him back in 3 weeks for cycle 14 and we will plan to get PET scan prior.  AST mildly elevated at 56 and mildly low platelets at 127 but overall tolerating Zynlonta  well without any significant side effects.   Visit Diagnosis 1. High grade B-cell lymphoma (HCC)   2. Encounter for antineoplastic chemotherapy   3. Abnormal LFTs      Dr. Seretha Dance, MD, MPH [facility] at [facility] [phone] 08/15/2023 12:16 PM

## 2023-08-17 ENCOUNTER — Ambulatory Visit

## 2023-08-17 ENCOUNTER — Inpatient Hospital Stay

## 2023-08-17 VITALS — BP 131/76 | HR 71 | Temp 97.0°F | Resp 16

## 2023-08-17 DIAGNOSIS — Z5111 Encounter for antineoplastic chemotherapy: Secondary | ICD-10-CM | POA: Diagnosis not present

## 2023-08-17 DIAGNOSIS — C8510 Unspecified B-cell lymphoma, unspecified site: Secondary | ICD-10-CM

## 2023-08-17 MED ORDER — PEGFILGRASTIM-FPGK 6 MG/0.6ML ~~LOC~~ SOSY
6.0000 mg | PREFILLED_SYRINGE | Freq: Once | SUBCUTANEOUS | Status: AC
  Administered 2023-08-17: 6 mg via SUBCUTANEOUS
  Filled 2023-08-17: qty 0.6

## 2023-08-24 ENCOUNTER — Other Ambulatory Visit: Payer: Medicaid Other

## 2023-09-05 ENCOUNTER — Encounter: Payer: Self-pay | Admitting: Oncology

## 2023-09-05 ENCOUNTER — Inpatient Hospital Stay

## 2023-09-05 ENCOUNTER — Inpatient Hospital Stay (HOSPITAL_BASED_OUTPATIENT_CLINIC_OR_DEPARTMENT_OTHER): Admitting: Oncology

## 2023-09-05 ENCOUNTER — Other Ambulatory Visit: Payer: Self-pay

## 2023-09-05 VITALS — BP 133/80 | HR 96 | Temp 97.6°F | Resp 18 | Ht 73.0 in | Wt 168.0 lb

## 2023-09-05 DIAGNOSIS — C8510 Unspecified B-cell lymphoma, unspecified site: Secondary | ICD-10-CM

## 2023-09-05 DIAGNOSIS — Z5111 Encounter for antineoplastic chemotherapy: Secondary | ICD-10-CM | POA: Diagnosis not present

## 2023-09-05 LAB — CMP (CANCER CENTER ONLY)
ALT: 24 U/L (ref 0–44)
AST: 51 U/L — ABNORMAL HIGH (ref 15–41)
Albumin: 3.9 g/dL (ref 3.5–5.0)
Alkaline Phosphatase: 126 U/L (ref 38–126)
Anion gap: 9 (ref 5–15)
BUN: 11 mg/dL (ref 8–23)
CO2: 24 mmol/L (ref 22–32)
Calcium: 9.2 mg/dL (ref 8.9–10.3)
Chloride: 105 mmol/L (ref 98–111)
Creatinine: 0.88 mg/dL (ref 0.61–1.24)
GFR, Estimated: >60 mL/min (ref >60)
Glucose, Bld: 107 mg/dL — ABNORMAL HIGH (ref 70–99)
Potassium: 3.4 mmol/L — ABNORMAL LOW (ref 3.5–5.1)
Sodium: 138 mmol/L (ref 135–145)
Total Bilirubin: 0.5 mg/dL (ref 0.0–1.2)
Total Protein: 6.9 g/dL (ref 6.5–8.1)

## 2023-09-05 LAB — CBC WITH DIFFERENTIAL (CANCER CENTER ONLY)
Abs Immature Granulocytes: 0.17 10*3/uL — ABNORMAL HIGH (ref 0.00–0.07)
Basophils Absolute: 0 10*3/uL (ref 0.0–0.1)
Basophils Relative: 1 %
Eosinophils Absolute: 0 10*3/uL (ref 0.0–0.5)
Eosinophils Relative: 0 %
HCT: 35.9 % — ABNORMAL LOW (ref 39.0–52.0)
Hemoglobin: 12.2 g/dL — ABNORMAL LOW (ref 13.0–17.0)
Immature Granulocytes: 3 %
Lymphocytes Relative: 21 %
Lymphs Abs: 1.4 10*3/uL (ref 0.7–4.0)
MCH: 32.3 pg (ref 26.0–34.0)
MCHC: 34 g/dL (ref 30.0–36.0)
MCV: 95 fL (ref 80.0–100.0)
Monocytes Absolute: 0.6 10*3/uL (ref 0.1–1.0)
Monocytes Relative: 9 %
Neutro Abs: 4.6 10*3/uL (ref 1.7–7.7)
Neutrophils Relative %: 66 %
Platelet Count: 143 10*3/uL — ABNORMAL LOW (ref 150–400)
RBC: 3.78 MIL/uL — ABNORMAL LOW (ref 4.22–5.81)
RDW: 15 % (ref 11.5–15.5)
WBC Count: 6.9 10*3/uL (ref 4.0–10.5)
nRBC: 0 % (ref 0.0–0.2)

## 2023-09-05 MED ORDER — TRIAMCINOLONE ACETONIDE 0.5 % EX OINT
1.0000 | TOPICAL_OINTMENT | Freq: Two times a day (BID) | CUTANEOUS | 3 refills | Status: DC
  Filled 2023-09-05: qty 30, 15d supply, fill #0
  Filled 2023-10-17: qty 30, 15d supply, fill #1

## 2023-09-05 MED ORDER — DEXAMETHASONE SODIUM PHOSPHATE 10 MG/ML IJ SOLN
10.0000 mg | Freq: Once | INTRAMUSCULAR | Status: AC
  Administered 2023-09-05: 10 mg via INTRAVENOUS
  Filled 2023-09-05: qty 1

## 2023-09-05 MED ORDER — LONCASTUXIMAB TESIRINE-LPYL CHEMO INJECTION 10 MG
0.0750 mg/kg | Freq: Once | INTRAVENOUS | Status: AC
  Administered 2023-09-05: 6.1 mg via INTRAVENOUS
  Filled 2023-09-05: qty 1.22

## 2023-09-05 MED ORDER — HEPARIN SOD (PORK) LOCK FLUSH 100 UNIT/ML IV SOLN
500.0000 [IU] | Freq: Once | INTRAVENOUS | Status: AC | PRN
  Administered 2023-09-05: 500 [IU]
  Filled 2023-09-05: qty 5

## 2023-09-05 MED ORDER — SODIUM CHLORIDE 0.9% FLUSH
10.0000 mL | INTRAVENOUS | Status: DC | PRN
  Administered 2023-09-05: 10 mL
  Filled 2023-09-05: qty 10

## 2023-09-05 MED ORDER — PREDNISONE 10 MG PO TABS
ORAL_TABLET | ORAL | 0 refills | Status: DC
  Filled 2023-09-05: qty 16, 6d supply, fill #0

## 2023-09-05 MED ORDER — DEXTROSE 5 % IV SOLN
Freq: Once | INTRAVENOUS | Status: AC
Start: 2023-09-05 — End: 2023-09-05
  Filled 2023-09-05: qty 250

## 2023-09-05 NOTE — Progress Notes (Signed)
 Patient is doing ok, he does need a couple of refills on medications, like his prednisone  and his triamcinolone  ointment. He did say that he does have some tingling and numbness in his hands sometimes.

## 2023-09-05 NOTE — Progress Notes (Signed)
 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  This was followed by a PET CT scan which showed extensive FDG avid adenopathy in the neck chest abdomen and pelvis.  FDG avid splenic lesions.  Solitary intramuscular FDG avid lesion in the right biceps femoris muscle.   Supraclavicular excisional lymph node biopsy showed high-grade B-cell lymphoma germinal center type Ki-67 greater than 95%.  FISH testing was positive for Bcl-2 BCL6 and MYC consistent with triple hit lymphoma.  By NCCN IPI score would be 4 based on age and elevated LDH and stage IV (intramuscular biceps femoris lesion) which puts him in the high intermediate risk group. 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   Patient received RCHOP for cycle 1 with plans for DA [facility] with cycle 2. IT MTX for CNS prophylaxis and he has received 3 cycles but declined further cycles.  MRI brain negative for lymphoma.  Chemotherapy was not being able to escalated upwards due to neutropenia which has persisted to 3 weeks and he has been receiving treatment every 4  weeks   PET CT scan after 3 cycles of chemotherapy showedComplete or near complete metabolic response.  Residual disease in the anterior upper right mediastinum slightly greater than background mediastinal activity   Patient found to have a clinically palpable right supraclavicular lymph node which was subsequently biopsied and was consistent with previously diagnosed lymphoma.  PET CT scan showedNew right level 5 adenopathy in the lower neck with dominant lymph node 1.6 cm Deauville 5.  No other findings of acute active lymphoma in chest abdomen pelvis and bones.  MRI brain negative for leptomeningeal disease.   Patient seen for second opinion by Dr. Gaylan at [facility] for consideration of CAR-T cell therapy.  Patient did not wish to proceed with CAR-T cell treatment after reviewing risks versus benefits.  Moreover patient's health insurance will also not cover CAR-T cell therapy. Patient started on second line tafasitamab /Revlimid  regimen in June 2023.  He is on Revlimid  15 mg 3 weeks on and 1 week off.  Disease progression after 3 cycles and patient was switched to [facility] regimen     Patient was noted to haveSignificant decrease in the size of supraclavicular lymph nodes consistent with treatment response Deauville 3 after 3 cycles of Pola BR chemotherapy.  Patient has only received 4 cycles of chemotherapy so far due to persistent neutropenia despite receiving growth factor support.  Repeat PET scan showed new/recurrent hypermetabolic subcutaneous tissue in the high right back/inferior neck consistent with recurrent lymphoma Deauville 5.  He received palliative radiation to that area.  Patient was started on Zynlonta  in June 2024 after he was found to have recurrent disease involving subcutaneous  right scapular region.  Patient desired treatment break and therefore treatment was held again in November 2024 when PET scan showed complete resolution of disease.  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  Swelling under his right arm has gotten smaller but persists  ECOG PS- 0 Pain scale- 0   Review of systems- Review of Systems  Constitutional:  Negative for chills, fever, malaise/fatigue and weight loss.  HENT:  Negative for congestion, ear discharge and nosebleeds.   Eyes:  Negative for blurred vision.  Respiratory:  Negative for cough, hemoptysis, sputum production, shortness of breath and wheezing.   Cardiovascular:  Negative for chest pain, palpitations, orthopnea and claudication.  Gastrointestinal:  Negative for abdominal pain, blood in stool, constipation, diarrhea, heartburn, melena, nausea and vomiting.  Genitourinary:  Negative for dysuria, flank pain, frequency, hematuria and urgency.  Musculoskeletal:  Negative for back pain, joint pain and myalgias.  Skin:  Negative for rash.  Neurological:  Negative for dizziness, tingling, focal weakness, seizures, weakness and headaches.  Endo/Heme/Allergies:  Does not bruise/bleed easily.  Psychiatric/Behavioral:  Negative for depression and suicidal ideas. The patient does not have insomnia.       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     Past Surgical History:  Procedure Laterality Date   BONE MARROW BIOPSY     COLONOSCOPY     EXCISION MASS NECK Right 06/12/2020   Procedure: EXCISION MASS NECK;  Surgeon: [name], MD;  Location: [facility] ORS;  Service: General;  Laterality: Right;   HERNIA REPAIR Right    at age 56-RIH   42 CATH INSERTION     PORTACATH PLACEMENT Right 06/24/2020   Procedure: INSERTION PORT-A-CATH;  Surgeon: [name], MD;  Location: [facility] ORS;  Service: General;   Laterality: Right;    Social History   Socioeconomic History   Marital status: Single    Spouse name: Not on file   Number of children: Not on file   Years of education: Not on file   Highest education level: Not on file  Occupational History   Not on file  Tobacco Use   Smoking status: Former    Current packs/day: 0.00    Types: Cigarettes    Quit date: 06/13/2020    Years since quitting: 3.2   Smokeless tobacco: Never  Vaping Use   Vaping status: Never Used  Substance and Sexual Activity   Alcohol use: Not Currently   Drug use: Not Currently    Types: Marijuana   Sexual activity: Not Currently  Other Topics Concern   Not on file  Social History Narrative   Has daughter and grandson in the home   Social Drivers of Health   Financial Resource Strain: Medium Risk (07/30/2021)   Received from [facility]   Overall Financial Resource Strain (CARDIA)    Difficulty of Paying Living Expenses: Somewhat hard  Food Insecurity: No Food Insecurity (07/30/2021)   Received from [facility]   Hunger Vital Sign    Within the past 12 months, you worried that your food would run out before you got the money to buy more.:  Never true    Within the past 12 months, the food you bought just didn't last and you didn't have money to get more.: Never true  Transportation Needs: No Transportation Needs (07/30/2021)   Received from [facility]   PRAPARE - Transportation    Lack of Transportation (Medical): No    Lack of Transportation (Non-Medical): No  Physical Activity: Not on file  Stress: No Stress Concern Present (07/30/2021)   Received from [facility] - Occupational Stress Questionnaire    Feeling of Stress : Not at all  Social Connections: Not on file  Intimate Partner Violence: Not on file    Family History  Problem Relation Age of Onset   Anemia Mother    Hypertension Mother    Goiter Mother    Cancer Father    Cancer  Sister    Multiple sclerosis Sister      Current Outpatient Medications:    aspirin  EC 81 MG tablet, Take 1 tablet (81 mg total) by mouth daily. Swallow whole., Disp: 30 tablet, Rfl: 0   atenolol (TENORMIN) 25 MG tablet, Take 25 mg by mouth daily., Disp: , Rfl:    cholecalciferol (VITAMIN D3) 25 MCG (1000 UNIT) tablet, Take 1,000 Units by mouth daily., Disp: , Rfl:    diphenhydrAMINE  (BENADRYL ) 2 % cream, Apply 1 Application topically 3 (three) times daily as needed for itching., Disp: 30 g, Rfl: 3   diphenhydrAMINE  (BENADRYL ) 25 mg capsule, Take 25 mg by mouth every 6 (six) hours as needed for itching or allergies., Disp: , Rfl:    doxycycline  (VIBRA -TABS) 100 MG tablet, Take 1 tablet (100 mg total) by mouth 2 (two) times daily., Disp: 14 tablet, Rfl: 0   lisinopril (ZESTRIL) 10 MG tablet, Take 10 mg by mouth daily., Disp: , Rfl:    Omega-3 Fatty Acids (FISH OIL ADULT GUMMIES PO), Take by mouth., Disp: , Rfl:    ondansetron  (ZOFRAN ) 8 MG tablet, Take 1 tablet (8 mg total) by mouth every 8 (eight) hours as needed for nausea or vomiting. (Patient not taking: Reported on 07/25/2023), Disp: 30 tablet, Rfl: 1   potassium chloride  SA (KLOR-CON  M) 20 MEQ tablet, Take 1 tablet (20 mEq total) by mouth daily., Disp: 21 tablet, Rfl: 0   predniSONE  (DELTASONE ) 10 MG tablet, Take 5 tablets (50 mg) by mouth once daily on Day 1. Then take 4 tablets (40 mg) on Day 2, then take 3 tablets (30 mg) on Day 3, then take 2 tablets (20 mg) on Day 4, then take 1 tablet (10 mg) on Day 5, then take 1/2 tablet (5 mg) on Day 6, then stop., Disp: 16 tablet, Rfl: 0   prochlorperazine  (COMPAZINE ) 10 MG tablet, Take 1 tablet (10 mg total) by mouth every 6 (six) hours as needed for nausea or vomiting., Disp: 30 tablet, Rfl: 1   Tenofovir  Alafenamide Fumarate (VEMLIDY ) 25 MG TABS, Take 1 tablet (25 mg total) by mouth daily. Take with food., Disp: 30 tablet, Rfl: 2   triamcinolone  ointment (KENALOG ) 0.5 %, Apply 1 Application  topically 2 (two) times daily., Disp: 30 g, Rfl: 3   vitamin C (ASCORBIC ACID) 500 MG tablet, Take 500 mg by mouth daily., Disp: , Rfl:  No current facility-administered medications for this visit.  Facility-Administered Medications Ordered in Other Visits:    0.9 %  sodium chloride  infusion, , Intravenous, Continuous, Brahmanday, Govinda R, MD, Stopped at 09/11/20 1502   heparin  lock  flush 100 unit/mL, 500 Units, Intravenous, Once, [name], MD   heparin  lock flush 100 unit/mL, 500 Units, Intravenous, Once, [name], MD   methotrexate  (PF) 12 mg in sodium chloride  (PF) 0.9 % INTRATHECAL chemo injection, , Intrathecal, Once, [name], MD   sodium chloride  flush (NS) 0.9 % injection 10 mL, 10 mL, Intracatheter, PRN, [name], MD, 10 mL at 01/18/23 1545   sodium chloride  flush (NS) 0.9 % injection 10 mL, 10 mL, Intravenous, Once, [name], MD  Physical exam: There were no vitals filed for this visit. Physical Exam  Cardiovascular:     Rate and Rhythm: Normal rate and regular rhythm.     Heart sounds: Normal heart sounds.  Pulmonary:     Effort: Pulmonary effort is normal.     Breath sounds: Normal breath sounds.  Abdominal:     General: Bowel sounds are normal.     Palpations: Abdomen is soft.   Skin:    General: Skin is warm and dry.     Comments: Palpable 1 cm subcutaneous mass in the undersurface of the right arm   Neurological:     Mental Status: He is alert and oriented to person, place, and time.      I have personally reviewed labs listed below:    Latest Ref Rng & Units 08/15/2023    8:21 AM  CMP  Glucose 70 - 99 mg/dL 898   BUN 8 - 23 mg/dL 12   Creatinine 9.38 - 1.24 mg/dL 9.26   Sodium 864 - 854 mmol/L 139   Potassium 3.5 - 5.1 mmol/L 4.1   Chloride 98 - 111 mmol/L 105   CO2 22 - 32 mmol/L 24   Calcium 8.9 - 10.3 mg/dL 9.2   Total Protein 6.5 - 8.1 g/dL 7.0   Total Bilirubin 0.0 - 1.2 mg/dL 0.5   Alkaline Phos 38 - 126 U/L 120    AST 15 - 41 U/L 56   ALT 0 - 44 U/L 25       Latest Ref Rng & Units 08/15/2023    8:21 AM  CBC  WBC 4.0 - 10.5 K/uL 7.4   Hemoglobin 13.0 - 17.0 g/dL 87.8   Hematocrit 60.9 - 52.0 % 35.8   Platelets 150 - 400 K/uL 127       Assessment and plan- Patient is a 63 y.o. 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  He will be getting his PET scan tomorrow and I will call him with the results of the PET scan and further management.  If PET scan shows response to treatment I am inclined to continue Zynlonta  until progression or toxicity instead of switching his treatments.  He was previously sent to [facility] for second opinion and given his lack of insurance he did not qualify for CAR-T cell therapy and patient was not interested in receiving that treatment as well.   Visit Diagnosis 1. High grade B-cell lymphoma (HCC)   2. Encounter for antineoplastic chemotherapy      Dr. Annah Melanee, MD, MPH [facility] at [facility] [phone] 09/05/2023 8:26 AM

## 2023-09-06 ENCOUNTER — Ambulatory Visit
Admission: RE | Admit: 2023-09-06 | Discharge: 2023-09-06 | Disposition: A | Discharge status: Home / Self Care | Source: Ambulatory Visit | Attending: Oncology | Admitting: Oncology

## 2023-09-06 DIAGNOSIS — C8510 Unspecified B-cell lymphoma, unspecified site: Secondary | ICD-10-CM | POA: Diagnosis present

## 2023-09-06 LAB — GLUCOSE, CAPILLARY: Glucose-Capillary: 97 mg/dL (ref 70–99)

## 2023-09-06 MED ORDER — FLUDEOXYGLUCOSE F - 18 (FDG) INJECTION
8.7000 | Freq: Once | INTRAVENOUS | Status: AC | PRN
  Administered 2023-09-06: 9.06 via INTRAVENOUS

## 2023-09-07 ENCOUNTER — Other Ambulatory Visit: Payer: Self-pay

## 2023-09-07 ENCOUNTER — Inpatient Hospital Stay

## 2023-09-07 ENCOUNTER — Telehealth: Payer: Self-pay

## 2023-09-07 DIAGNOSIS — Z5111 Encounter for antineoplastic chemotherapy: Secondary | ICD-10-CM | POA: Diagnosis not present

## 2023-09-07 DIAGNOSIS — C8510 Unspecified B-cell lymphoma, unspecified site: Secondary | ICD-10-CM

## 2023-09-07 MED ORDER — PEGFILGRASTIM-FPGK 6 MG/0.6ML ~~LOC~~ SOSY
6.0000 mg | PREFILLED_SYRINGE | Freq: Once | SUBCUTANEOUS | Status: AC
  Administered 2023-09-07: 6 mg via SUBCUTANEOUS
  Filled 2023-09-07: qty 0.6

## 2023-09-07 NOTE — Telephone Encounter (Signed)
 Spoke to [place]; requested a stat read on pet scan done yesterday 09/06/23.  Sheryl said she will put it on the stat list but did indicate it is very long.  Will keep note open until finalized.

## 2023-09-08 ENCOUNTER — Ambulatory Visit: Payer: Self-pay | Admitting: Oncology

## 2023-09-08 ENCOUNTER — Encounter: Payer: Self-pay | Admitting: Oncology

## 2023-09-08 NOTE — Telephone Encounter (Signed)
PET resulted

## 2023-09-26 ENCOUNTER — Other Ambulatory Visit: Payer: Self-pay

## 2023-09-26 ENCOUNTER — Inpatient Hospital Stay: Attending: Oncology

## 2023-09-26 ENCOUNTER — Inpatient Hospital Stay (HOSPITAL_BASED_OUTPATIENT_CLINIC_OR_DEPARTMENT_OTHER): Admitting: Internal Medicine

## 2023-09-26 ENCOUNTER — Ambulatory Visit: Admitting: Oncology

## 2023-09-26 ENCOUNTER — Encounter: Payer: Self-pay | Admitting: Oncology

## 2023-09-26 ENCOUNTER — Encounter: Payer: Self-pay | Admitting: Internal Medicine

## 2023-09-26 ENCOUNTER — Inpatient Hospital Stay

## 2023-09-26 VITALS — BP 129/80 | HR 71 | Temp 96.9°F | Resp 16 | Ht 73.0 in | Wt 165.8 lb

## 2023-09-26 DIAGNOSIS — Z8249 Family history of ischemic heart disease and other diseases of the circulatory system: Secondary | ICD-10-CM | POA: Insufficient documentation

## 2023-09-26 DIAGNOSIS — Z79899 Other long term (current) drug therapy: Secondary | ICD-10-CM | POA: Diagnosis not present

## 2023-09-26 DIAGNOSIS — Z9889 Other specified postprocedural states: Secondary | ICD-10-CM | POA: Insufficient documentation

## 2023-09-26 DIAGNOSIS — Z79631 Long term (current) use of antimetabolite agent: Secondary | ICD-10-CM | POA: Insufficient documentation

## 2023-09-26 DIAGNOSIS — Z5986 Financial insecurity: Secondary | ICD-10-CM | POA: Insufficient documentation

## 2023-09-26 DIAGNOSIS — Z87891 Personal history of nicotine dependence: Secondary | ICD-10-CM | POA: Diagnosis not present

## 2023-09-26 DIAGNOSIS — Z7982 Long term (current) use of aspirin: Secondary | ICD-10-CM | POA: Diagnosis not present

## 2023-09-26 DIAGNOSIS — Z8349 Family history of other endocrine, nutritional and metabolic diseases: Secondary | ICD-10-CM | POA: Diagnosis not present

## 2023-09-26 DIAGNOSIS — Z832 Family history of diseases of the blood and blood-forming organs and certain disorders involving the immune mechanism: Secondary | ICD-10-CM | POA: Diagnosis not present

## 2023-09-26 DIAGNOSIS — J342 Deviated nasal septum: Secondary | ICD-10-CM | POA: Diagnosis not present

## 2023-09-26 DIAGNOSIS — N3289 Other specified disorders of bladder: Secondary | ICD-10-CM | POA: Diagnosis not present

## 2023-09-26 DIAGNOSIS — Z809 Family history of malignant neoplasm, unspecified: Secondary | ICD-10-CM | POA: Diagnosis not present

## 2023-09-26 DIAGNOSIS — C8338 Diffuse large B-cell lymphoma, lymph nodes of multiple sites: Secondary | ICD-10-CM | POA: Diagnosis present

## 2023-09-26 DIAGNOSIS — C8510 Unspecified B-cell lymphoma, unspecified site: Secondary | ICD-10-CM

## 2023-09-26 LAB — CBC WITH DIFFERENTIAL (CANCER CENTER ONLY)
Abs Immature Granulocytes: 0.13 K/uL — ABNORMAL HIGH (ref 0.00–0.07)
Basophils Absolute: 0.1 K/uL (ref 0.0–0.1)
Basophils Relative: 1 %
Eosinophils Absolute: 0.1 K/uL (ref 0.0–0.5)
Eosinophils Relative: 1 %
HCT: 36.7 % — ABNORMAL LOW (ref 39.0–52.0)
Hemoglobin: 12.3 g/dL — ABNORMAL LOW (ref 13.0–17.0)
Immature Granulocytes: 2 %
Lymphocytes Relative: 28 %
Lymphs Abs: 1.7 K/uL (ref 0.7–4.0)
MCH: 32.4 pg (ref 26.0–34.0)
MCHC: 33.5 g/dL (ref 30.0–36.0)
MCV: 96.6 fL (ref 80.0–100.0)
Monocytes Absolute: 0.6 K/uL (ref 0.1–1.0)
Monocytes Relative: 9 %
Neutro Abs: 3.4 K/uL (ref 1.7–7.7)
Neutrophils Relative %: 59 %
Platelet Count: 141 K/uL — ABNORMAL LOW (ref 150–400)
RBC: 3.8 MIL/uL — ABNORMAL LOW (ref 4.22–5.81)
RDW: 14.6 % (ref 11.5–15.5)
WBC Count: 5.9 K/uL (ref 4.0–10.5)
nRBC: 0 % (ref 0.0–0.2)

## 2023-09-26 LAB — CMP (CANCER CENTER ONLY)
ALT: 33 U/L (ref 0–44)
AST: 85 U/L — ABNORMAL HIGH (ref 15–41)
Albumin: 3.8 g/dL (ref 3.5–5.0)
Alkaline Phosphatase: 131 U/L — ABNORMAL HIGH (ref 38–126)
Anion gap: 6 (ref 5–15)
BUN: 11 mg/dL (ref 8–23)
CO2: 25 mmol/L (ref 22–32)
Calcium: 9.2 mg/dL (ref 8.9–10.3)
Chloride: 105 mmol/L (ref 98–111)
Creatinine: 0.86 mg/dL (ref 0.61–1.24)
GFR, Estimated: >60 mL/min (ref >60)
Glucose, Bld: 108 mg/dL — ABNORMAL HIGH (ref 70–99)
Potassium: 3.6 mmol/L (ref 3.5–5.1)
Sodium: 136 mmol/L (ref 135–145)
Total Bilirubin: 0.8 mg/dL (ref 0.0–1.2)
Total Protein: 6.7 g/dL (ref 6.5–8.1)

## 2023-09-26 MED ORDER — DOXYCYCLINE HYCLATE 50 MG PO CAPS
50.0000 mg | ORAL_CAPSULE | Freq: Every day | ORAL | 0 refills | Status: DC
  Filled 2023-09-26: qty 30, 30d supply, fill #0

## 2023-09-26 MED ORDER — HEPARIN SOD (PORK) LOCK FLUSH 100 UNIT/ML IV SOLN
500.0000 [IU] | Freq: Once | INTRAVENOUS | Status: AC
  Administered 2023-09-26: 500 [IU] via INTRAVENOUS
  Filled 2023-09-26: qty 5

## 2023-09-26 NOTE — Progress Notes (Signed)
 Pt would like to discuss not having treatment today. Breaking out with skin blisters/rash, itchy. Heat seems to make it worse, he works outside in a shop.  He would like you to go over his most recent PET scan.

## 2023-09-26 NOTE — Progress Notes (Signed)
 [facility] CONSULT NOTE  Patient Care Team: Weyman Bright, MD as PCP - General (Internal Medicine) [name], MD as Consulting Physician (Hematology and Oncology) [name], MD as Consulting Physician (General Surgery)  CHIEF COMPLAINTS/PURPOSE OF CONSULTATION: Lymphoma  Oncology History  High grade B-cell lymphoma (HCC)  06/18/2020 Initial Diagnosis   High grade B-cell lymphoma (HCC)   06/18/2020 Cancer Staging   Staging form: Hodgkin and Non-Hodgkin Lymphoma, AJCC 8th Edition - Clinical stage from 06/18/2020: Stage IV (Diffuse large B-cell lymphoma) - Signed by [name], MD on 06/18/2020 Stage prefix: Initial diagnosis   06/21/2020 - 06/21/2020 Chemotherapy         07/03/2020 - 07/08/2020 Chemotherapy         08/12/2020 - 12/05/2020 Chemotherapy   Patient is on Treatment Plan :  NON-HODGKINS LYMPHOMA R-EPOCH q21d     08/17/2021 - 10/12/2021 Chemotherapy   Patient is on Treatment Plan : NON-HODGKIN'S LYMPHOMA DLBCL RELAPSED/REFRACTORY Tafasitamab -cxix + Lenalidomide  q28d / Tafasitamab -cxix Maintenance q28d     10/29/2021 - 11/02/2021 Chemotherapy   Patient is on Treatment Plan : NON-HODGKINS LYMPHOMA RELAPSED/ REFRACTORY DLBCL Polatuzumab + Bendamustine  + Rituximab  q21d x 6 cycles     11/12/2021 - 11/12/2021 Chemotherapy   Patient is on Treatment Plan : NON-HODGKINS LYMPHOMA RELAPSED/ REFRACTORY DLBCL Polatuzumab D1 + Bendamustine  D1,2 + Rituximab  D1 q21d x 6 cycles     11/18/2021 - 01/01/2022 Chemotherapy   Patient is on Treatment Plan : NON-HODGKINS LYMPHOMA RELAPSED/ REFRACTORY DLBCL Polatuzumab D1 + Bendamustine  D1,2 + Rituximab  D1 q21d x 6 cycles     08/24/2022 -  Chemotherapy   Patient is on Treatment Plan : LYMPHOMA Loncastuximab tesirine  D1 q21d       HISTORY OF PRESENTING ILLNESS: Patient ambulating-independently. Alone  Christopher Mills 63 y.o.  male pleasant patient with a history of relapsed refractory diffuse large B-cell lymphoma currently on  Zynlonta -is here to review the results of the PET scan and proceed with chemotherapy.  Patient notes to have break out  of skin bilateral extremities. Improved with steroid ointment,.   Review of Systems  Constitutional:  Negative for chills, diaphoresis, fever and weight loss.  HENT:  Negative for nosebleeds and sore throat.   Eyes:  Negative for double vision.  Respiratory:  Negative for cough, hemoptysis, sputum production, shortness of breath and wheezing.   Cardiovascular:  Negative for chest pain, palpitations, orthopnea and leg swelling.  Gastrointestinal:  Negative for abdominal pain, blood in stool, constipation, diarrhea, heartburn, melena, nausea and vomiting.  Genitourinary:  Negative for dysuria, frequency and urgency.  Musculoskeletal:  Negative for joint pain.  Skin:  Positive for itching and rash.  Neurological:  Negative for dizziness, tingling, focal weakness, weakness and headaches.  Endo/Heme/Allergies:  Does not bruise/bleed easily.  Psychiatric/Behavioral:  Negative for depression. The patient is not nervous/anxious and does not have insomnia.     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    SURGICAL HISTORY: Past Surgical History:  Procedure Laterality Date   BONE MARROW BIOPSY     COLONOSCOPY     EXCISION MASS NECK Right 06/12/2020   Procedure: EXCISION MASS NECK;  Surgeon: [name], MD;  Location: [facility] ORS;  Service: General;  Laterality: Right;   HERNIA REPAIR Right    at age 34-RIH   PORTA CATH  INSERTION     PORTACATH PLACEMENT Right 06/24/2020   Procedure: INSERTION PORT-A-CATH;  Surgeon: [name], MD;  Location: [facility] ORS;  Service: General;  Laterality: Right;    SOCIAL HISTORY: Social History   Socioeconomic History   Marital status: Single    Spouse name: Not on file   Number of children: Not on  file   Years of education: Not on file   Highest education level: Not on file  Occupational History   Not on file  Tobacco Use   Smoking status: Former    Current packs/day: 0.00    Types: Cigarettes    Quit date: 06/13/2020    Years since quitting: 3.2   Smokeless tobacco: Never  Vaping Use   Vaping status: Never Used  Substance and Sexual Activity   Alcohol use: Not Currently   Drug use: Not Currently    Types: Marijuana   Sexual activity: Not Currently  Other Topics Concern   Not on file  Social History Narrative   Has daughter and grandson in the home   Social Drivers of Health   Financial Resource Strain: Medium Risk (07/30/2021)   Received from [facility]   Overall Financial Resource Strain (CARDIA)    Difficulty of Paying Living Expenses: Somewhat hard  Food Insecurity: No Food Insecurity (07/30/2021)   [facility]   Hunger Vital Sign    Within the past 12 months, you worried that your food would run out before you got the money to buy more.: Never true    Within the past 12 months, the food you bought just didn't last and you didn't have money to get more.: Never true  Transportation Needs: No Transportation Needs (07/30/2021)   Received from [facility]   PRAPARE - Transportation    Lack of Transportation (Medical): No    Lack of Transportation (Non-Medical): No  Physical Activity: Not on file  Stress: No Stress Concern Present (07/30/2021)   Received from [facility] of Occupational Health - Occupational Stress Questionnaire    Feeling of Stress : Not at all  Social Connections: Not on file  Intimate Partner Violence: Not on file    FAMILY HISTORY: Family History  Problem Relation Age of Onset   Anemia Mother    Hypertension Mother    Goiter Mother    Cancer Father    Cancer Sister    Multiple sclerosis Sister     ALLERGIES:  has no known allergies.  MEDICATIONS:  Current Outpatient Medications   Medication Sig Dispense Refill   atenolol (TENORMIN) 25 MG tablet Take 25 mg by mouth daily.     cholecalciferol (VITAMIN D3) 25 MCG (1000 UNIT) tablet Take 1,000 Units by mouth daily.     diphenhydrAMINE  (BENADRYL ) 2 % cream Apply 1 Application topically 3 (three) times daily as needed for itching. 30 g 3   diphenhydrAMINE  (BENADRYL ) 25 mg capsule Take 25 mg by mouth every 6 (six) hours as needed for itching or allergies.     doxycycline  (VIBRAMYCIN ) 50 MG capsule Take 1 capsule (50 mg total) by mouth daily. 30 capsule 0   lisinopril (ZESTRIL) 10 MG tablet Take 10 mg by mouth daily.     Omega-3 Fatty Acids (FISH OIL ADULT GUMMIES PO) Take by mouth.     predniSONE  (DELTASONE ) 10 MG tablet Take 5 tablets (50 mg) by mouth once daily on Day 1. Then take 4 tablets (40 mg) on Day  2, then take 3 tablets (30 mg) on Day 3, then take 2 tablets (20 mg) on Day 4, then take 1 tablet (10 mg) on Day 5, then take 1/2 tablet (5 mg) on Day 6, then stop. 16 tablet 0   triamcinolone  ointment (KENALOG ) 0.5 % Apply 1 Application topically 2 (two) times daily. 30 g 3   vitamin C (ASCORBIC ACID) 500 MG tablet Take 500 mg by mouth daily.     aspirin  EC 81 MG tablet Take 1 tablet (81 mg total) by mouth daily. Swallow whole. (Patient not taking: Reported on 09/26/2023) 30 tablet 0   ondansetron  (ZOFRAN ) 8 MG tablet Take 1 tablet (8 mg total) by mouth every 8 (eight) hours as needed for nausea or vomiting. (Patient not taking: Reported on 09/26/2023) 30 tablet 1   potassium chloride  SA (KLOR-CON  M) 20 MEQ tablet Take 1 tablet (20 mEq total) by mouth daily. (Patient not taking: Reported on 09/26/2023) 21 tablet 0   prochlorperazine  (COMPAZINE ) 10 MG tablet Take 1 tablet (10 mg total) by mouth every 6 (six) hours as needed for nausea or vomiting. (Patient not taking: Reported on 09/26/2023) 30 tablet 1   Tenofovir  Alafenamide Fumarate (VEMLIDY ) 25 MG TABS Take 1 tablet (25 mg total) by mouth daily. Take with food. (Patient not  taking: Reported on 09/26/2023) 30 tablet 2   No current facility-administered medications for this visit.   Facility-Administered Medications Ordered in Other Visits  Medication Dose Route Frequency Provider Last Rate Last Admin   0.9 %  sodium chloride  infusion   Intravenous Continuous Leondro Coryell R, MD   Stopped at 09/11/20 1502   heparin  lock flush 100 unit/mL  500 Units Intravenous Once Rao, Archana C, MD       heparin  lock flush 100 unit/mL  500 Units Intravenous Once Rao, Archana C, MD       methotrexate  (PF) 12 mg in sodium chloride  (PF) 0.9 % INTRATHECAL chemo injection   Intrathecal Once Rao, Archana C, MD       sodium chloride  flush (NS) 0.9 % injection 10 mL  10 mL Intracatheter PRN [name], MD   10 mL at 01/18/23 1545   sodium chloride  flush (NS) 0.9 % injection 10 mL  10 mL Intravenous Once [name], MD        PHYSICAL EXAMINATION:   Vitals:   09/26/23 0814  BP: 129/80  Pulse: 71  Resp: 16  Temp: (!) 96.9 F (36.1 C)  SpO2: 100%   Filed Weights   09/26/23 0814  Weight: 165 lb 12.8 oz (75.2 kg)   2 to 3 cm nodules x 2 noted on the-lateral aspect of the upper right extremity/underarm  Physical Exam Vitals and nursing note reviewed.  HENT:     Head: Normocephalic and atraumatic.     Mouth/Throat:     Pharynx: Oropharynx is clear.  Eyes:     Extraocular Movements: Extraocular movements intact.     Pupils: Pupils are equal, round, and reactive to light.  Cardiovascular:     Rate and Rhythm: Normal rate and regular rhythm.  Pulmonary:     Comments: Decreased breath sounds bilaterally.  Abdominal:     Palpations: Abdomen is soft.  Musculoskeletal:        General: Normal range of motion.     Cervical back: Normal range of motion.  Skin:    General: Skin is warm.  Neurological:     General: No focal deficit present.  Mental Status: He is alert and oriented to person, place, and time.  Psychiatric:        Behavior: Behavior normal.         Judgment: Judgment normal.     LABORATORY DATA:  I have reviewed the data as listed Lab Results  Component Value Date   WBC 5.9 09/26/2023   HGB 12.3 (L) 09/26/2023   HCT 36.7 (L) 09/26/2023   MCV 96.6 09/26/2023   PLT 141 (L) 09/26/2023   Recent Labs    08/15/23 0821 09/05/23 0815 09/26/23 0813  NA 139 138 136  K 4.1 3.4* 3.6  CL 105 105 105  CO2 24 24 25   GLUCOSE 101* 107* 108*  BUN 12 11 11   CREATININE 0.73 0.88 0.86  CALCIUM 9.2 9.2 9.2  GFRNONAA >60 >60 >60  PROT 7.0 6.9 6.7  ALBUMIN 3.9 3.9 3.8  AST 56* 51* 85*  ALT 25 24 33  ALKPHOS 120 126 131*  BILITOT 0.5 0.5 0.8    RADIOGRAPHIC STUDIES: I have personally reviewed the radiological images as listed and agreed with the findings in the report. NM PET Image Restag (PS) Skull Base To Thigh Result Date: 09/07/2023 CLINICAL DATA:  Subsequent treatment strategy for high-grade B-cell lymphoma. EXAM: NUCLEAR MEDICINE PET SKULL BASE TO THIGH TECHNIQUE: 9.06 mCi F-18 FDG was injected intravenously. Full-ring PET imaging was performed from the skull base to thigh after the radiotracer. CT data was obtained and used for attenuation correction and anatomic localization. Fasting blood glucose: 97 mg/dl COMPARISON:  PET-CT 96/78/7974 FINDINGS: Mediastinal blood pool activity: SUV max 1.8 Liver activity: SUV max 3.1 NECK: No specific abnormal uptake identified in the neck including along lymph node change of the submandibular, posterior triangle or internal jugular region. Near symmetric uptake of the visualized intracranial compartment. Incidental CT findings: The parotid glands, submandibular glands and thyroid  gland are unremarkable. Nasal septal deviation identified. Visualized portions of the paranasal sinuses and the mastoid air cells are clear. Slight areas of vascular calcifications in the neck. CHEST: On the previous exam there are areas of nodular uptake in the subcutaneous fat lateral to the upper chest and extremity  musculature. These areas have shown improvement. Lesion seen previously at 11.4 maximum SUV and diameter of 22 mm, today has maximum SUV of 6.7 in diameter on image 43 of series 6 measures 10 mm. The larger focus more caudal is also smaller and shows less uptake. Previously measuring 22 mm and today 17 mm on image 50. In addition there are several areas of uptake within the deltoid muscle itself on the previous which have shown significant improvement. No residual abnormal uptake compared to blood pool. Otherwise there is no specific abnormal uptake above blood pool in the axillary regions, hilum or mediastinum. No abnormal lung uptake. Incidental CT findings: Heart is nonenlarged. There is some coronary calcifications seen. Please correlate for other coronary risk factors. No pericardial effusion. The thoracic aorta is normal course and caliber. There is right IJ chest port in place with tip extending to the central SVC. Slightly patulous thoracic esophagus. There is some breathing motion. There is some linear opacity seen along bases likely scar or atelectasis. Is also some linear areas of pleural thickening laterally about the right lung apex which are stable. ABDOMEN/PELVIS: Physiologic distribution radiotracer identified along the parenchymal organs, bowel and renal collecting systems. Specifically the spleen does not show any abnormal uptake. Longitudinal length of the spleen today approaches 10.6 cm. Previously 9.8 cm. Previous areas of  uptake in the liver have resolved. No specific areas of abnormal uptake along the no change in the abdomen and pelvis. Once again there is some wall thickening/fold thickening of the stomach with some mild uptake. This asymmetric uptake previously was more extensive and had maximum SUV of up to 6.6 and today maximum SUV of 5.9. Incidental CT findings: With the limits of this noncontrast, attenuation correction CT, the liver, spleen, adrenal glands and pancreas are  unremarkable. Gallbladder is present. No renal or ureteral stones are identified. Slight wall thickening of the underdistended urinary bladder. Scattered vascular calcifications along the aorta and branch vessels. The large bowel has a overall normal course and caliber with scattered stool. Small bowel is nondilated. No free air or free fluid. SKELETON: Previously there is some scattered small areas of abnormal uptake along the skeleton including humeri, right femoral neck, left scapula, spine and ribs. These have shown significant improvement. Specifically the lesion in the right femoral neck, spinous process L2, multiple ribs and left acromion have resolved. The lesion involving the right humeral neck which previously had maximum SUV value 3.6 on the prior, today is again seen with maximum SUV of 9.2, increased. There is some sclerosis in this location as well on the CT scan. No new bony areas of abnormal uptake. Incidental CT findings: Scattered degenerative changes. IMPRESSION: Overall significant interval improvement. Resolution of all of the previously seen hypermetabolic bone lesions except for slightly more intense uptake involving the lesion at the right humeral neck. The previous lesions in the liver no longer identified. Lesions involving the right deltoid muscle and the subcutaneous fat adjacent to this location are improving from previous examination. Subcutaneous fat lesions continue show abnormal uptake. No new areas of abnormal radiotracer uptake at this time. Deauville category 5 of the remaining disease Electronically Signed   By: Ranell Bring M.D.   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 JUNE 24th, 2025- PET scan:  Overall significant interval improvement. Resolution of all of the previously seen hypermetabolic bone lesions except for slightly more intense uptake  involving the lesion at the right humeral neck. The previous lesions in the liver no longer identified. Lesions involving the right deltoid muscle and the subcutaneous fat adjacent to this location are improving from previous examination. Subcutaneous fat lesions continue show abnormal uptake. No new areas of abnormal radiotracer uptake at this time. Deauville category 5 of the remaining disease.  # June 2025 PET scan shows -partial response significant overall improvement-except for slightly more intense uptake in the right humeral neck-patient symptomatic.  Recommend continue Zylonta- however see below.   # given skin rash/ and patient preference-HOLD  cycle 15  of Zynlonta . Labs-CBC/chemistries were reviewed with the patient.    # Skin rash/blisters of bilateral extremities: likely sec to Zylonta- itchy- sun exposure - recommend continue topical kenalog  ointment ; recommend Doxycycline  50 mg/day for anti-inflammatory-   PS;  # DISPOSITION: # HOLD chemo today; de-access # as planned  follow up in 3 weeks-Dr.Rao-  MD: labs- cbc/cmp.LDH- chemo; injection -Dr.B  Above plan of care was discussed with patient/family in detail.  My contact information was given to the patient/family.      Christopher JONELLE Joe, MD 09/26/2023 9:20 AM

## 2023-09-26 NOTE — Assessment & Plan Note (Addendum)
#    relapsed/primary refractory triple hit diffuse large B-cell lymphoma-currently on cycle 14 of Zynlonta . JUNE 24th, 2025- PET scan:  Overall significant interval improvement. Resolution of all of the previously seen hypermetabolic bone lesions except for slightly more intense uptake involving the lesion at the right humeral neck. The previous lesions in the liver no longer identified. Lesions involving the right deltoid muscle and the subcutaneous fat adjacent to this location are improving from previous examination. Subcutaneous fat lesions continue show abnormal uptake. No new areas of abnormal radiotracer uptake at this time. Deauville category 5 of the remaining disease.  # June 2025 PET scan shows -partial response significant overall improvement-except for slightly more intense uptake in the right humeral neck-patient symptomatic.  Recommend continue Zylonta- however see below.   # given skin rash/ and patient preference-HOLD  cycle 15  of Zynlonta . Labs-CBC/chemistries were reviewed with the patient.    # Skin rash/blisters of bilateral extremities: likely sec to Zylonta- itchy- sun exposure - recommend continue topical kenalog  ointment ; recommend Doxycycline  50 mg/day for anti-inflammatory-   PS;  # DISPOSITION: # HOLD chemo today; de-access # as planned  follow up in 3 weeks-Dr.Rao-  MD: labs- cbc/cmp.LDH- chemo; injection -Dr.B

## 2023-09-28 ENCOUNTER — Inpatient Hospital Stay

## 2023-10-17 ENCOUNTER — Inpatient Hospital Stay: Attending: Oncology

## 2023-10-17 ENCOUNTER — Encounter: Payer: Self-pay | Admitting: Oncology

## 2023-10-17 ENCOUNTER — Inpatient Hospital Stay (HOSPITAL_BASED_OUTPATIENT_CLINIC_OR_DEPARTMENT_OTHER): Attending: Oncology | Admitting: Oncology

## 2023-10-17 ENCOUNTER — Other Ambulatory Visit: Payer: Self-pay

## 2023-10-17 ENCOUNTER — Inpatient Hospital Stay

## 2023-10-17 VITALS — BP 169/91 | HR 53 | Resp 19

## 2023-10-17 VITALS — BP 157/84 | HR 57 | Temp 96.0°F | Resp 18 | Ht 73.0 in | Wt 169.3 lb

## 2023-10-17 DIAGNOSIS — C8510 Unspecified B-cell lymphoma, unspecified site: Secondary | ICD-10-CM

## 2023-10-17 DIAGNOSIS — C8338 Diffuse large B-cell lymphoma, lymph nodes of multiple sites: Secondary | ICD-10-CM | POA: Diagnosis present

## 2023-10-17 DIAGNOSIS — Z79624 Long term (current) use of inhibitors of nucleotide synthesis: Secondary | ICD-10-CM | POA: Insufficient documentation

## 2023-10-17 DIAGNOSIS — Z79631 Long term (current) use of antimetabolite agent: Secondary | ICD-10-CM | POA: Insufficient documentation

## 2023-10-17 DIAGNOSIS — Z8249 Family history of ischemic heart disease and other diseases of the circulatory system: Secondary | ICD-10-CM | POA: Diagnosis not present

## 2023-10-17 DIAGNOSIS — Z832 Family history of diseases of the blood and blood-forming organs and certain disorders involving the immune mechanism: Secondary | ICD-10-CM | POA: Diagnosis not present

## 2023-10-17 DIAGNOSIS — Z79899 Other long term (current) drug therapy: Secondary | ICD-10-CM | POA: Insufficient documentation

## 2023-10-17 DIAGNOSIS — Z5986 Financial insecurity: Secondary | ICD-10-CM | POA: Insufficient documentation

## 2023-10-17 DIAGNOSIS — Z7982 Long term (current) use of aspirin: Secondary | ICD-10-CM | POA: Diagnosis not present

## 2023-10-17 DIAGNOSIS — Z5111 Encounter for antineoplastic chemotherapy: Secondary | ICD-10-CM

## 2023-10-17 DIAGNOSIS — Z5112 Encounter for antineoplastic immunotherapy: Secondary | ICD-10-CM | POA: Insufficient documentation

## 2023-10-17 DIAGNOSIS — R21 Rash and other nonspecific skin eruption: Secondary | ICD-10-CM | POA: Insufficient documentation

## 2023-10-17 DIAGNOSIS — Z8269 Family history of other diseases of the musculoskeletal system and connective tissue: Secondary | ICD-10-CM | POA: Insufficient documentation

## 2023-10-17 DIAGNOSIS — Z5189 Encounter for other specified aftercare: Secondary | ICD-10-CM | POA: Diagnosis not present

## 2023-10-17 DIAGNOSIS — Z8349 Family history of other endocrine, nutritional and metabolic diseases: Secondary | ICD-10-CM | POA: Diagnosis not present

## 2023-10-17 DIAGNOSIS — Z87891 Personal history of nicotine dependence: Secondary | ICD-10-CM | POA: Insufficient documentation

## 2023-10-17 DIAGNOSIS — Z7961 Long term (current) use of immunomodulator: Secondary | ICD-10-CM | POA: Insufficient documentation

## 2023-10-17 DIAGNOSIS — Z809 Family history of malignant neoplasm, unspecified: Secondary | ICD-10-CM | POA: Diagnosis not present

## 2023-10-17 LAB — CBC WITH DIFFERENTIAL (CANCER CENTER ONLY)
Abs Immature Granulocytes: 0.07 K/uL (ref 0.00–0.07)
Basophils Absolute: 0 K/uL (ref 0.0–0.1)
Basophils Relative: 1 %
Eosinophils Absolute: 0 K/uL (ref 0.0–0.5)
Eosinophils Relative: 1 %
HCT: 36.4 % — ABNORMAL LOW (ref 39.0–52.0)
Hemoglobin: 12.3 g/dL — ABNORMAL LOW (ref 13.0–17.0)
Immature Granulocytes: 2 %
Lymphocytes Relative: 49 %
Lymphs Abs: 1.4 K/uL (ref 0.7–4.0)
MCH: 31.7 pg (ref 26.0–34.0)
MCHC: 33.8 g/dL (ref 30.0–36.0)
MCV: 93.8 fL (ref 80.0–100.0)
Monocytes Absolute: 0.3 K/uL (ref 0.1–1.0)
Monocytes Relative: 8 %
Neutro Abs: 1.2 K/uL — ABNORMAL LOW (ref 1.7–7.7)
Neutrophils Relative %: 39 %
Platelet Count: 124 K/uL — ABNORMAL LOW (ref 150–400)
RBC: 3.88 MIL/uL — ABNORMAL LOW (ref 4.22–5.81)
RDW: 13.2 % (ref 11.5–15.5)
Smear Review: NORMAL
WBC Count: 3 K/uL — ABNORMAL LOW (ref 4.0–10.5)
nRBC: 0 % (ref 0.0–0.2)

## 2023-10-17 LAB — CMP (CANCER CENTER ONLY)
ALT: 28 U/L (ref 0–44)
AST: 58 U/L — ABNORMAL HIGH (ref 15–41)
Albumin: 3.6 g/dL (ref 3.5–5.0)
Alkaline Phosphatase: 109 U/L (ref 38–126)
Anion gap: 8 (ref 5–15)
BUN: 9 mg/dL (ref 8–23)
CO2: 25 mmol/L (ref 22–32)
Calcium: 9.3 mg/dL (ref 8.9–10.3)
Chloride: 104 mmol/L (ref 98–111)
Creatinine: 1.06 mg/dL (ref 0.61–1.24)
GFR, Estimated: >60 mL/min (ref >60)
Glucose, Bld: 129 mg/dL — ABNORMAL HIGH (ref 70–99)
Potassium: 3.5 mmol/L (ref 3.5–5.1)
Sodium: 137 mmol/L (ref 135–145)
Total Bilirubin: 0.7 mg/dL (ref 0.0–1.2)
Total Protein: 6.5 g/dL (ref 6.5–8.1)

## 2023-10-17 MED ORDER — DEXAMETHASONE SODIUM PHOSPHATE 10 MG/ML IJ SOLN
10.0000 mg | Freq: Once | INTRAMUSCULAR | Status: AC
  Administered 2023-10-17: 10 mg via INTRAVENOUS

## 2023-10-17 MED ORDER — HEPARIN SOD (PORK) LOCK FLUSH 100 UNIT/ML IV SOLN
500.0000 [IU] | Freq: Once | INTRAVENOUS | Status: DC | PRN
Start: 2023-10-17 — End: 2023-10-17
  Filled 2023-10-17: qty 5

## 2023-10-17 MED ORDER — DEXTROSE 5 % IV SOLN
Freq: Once | INTRAVENOUS | Status: AC
Start: 2023-10-17 — End: 2023-10-17
  Filled 2023-10-17: qty 250

## 2023-10-17 MED ORDER — LONCASTUXIMAB TESIRINE-LPYL CHEMO INJECTION 10 MG
0.0750 mg/kg | Freq: Once | INTRAVENOUS | Status: AC
  Administered 2023-10-17: 6.1 mg via INTRAVENOUS
  Filled 2023-10-17: qty 1.22

## 2023-10-17 NOTE — Progress Notes (Signed)
 Patient reports he is doing okay with no new or acute concerns at this time.

## 2023-10-17 NOTE — Progress Notes (Signed)
 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  This was followed by a PET CT scan which showed extensive FDG avid adenopathy in the neck chest abdomen and pelvis.  FDG avid splenic lesions.  Solitary intramuscular FDG avid lesion in the right biceps femoris muscle.   Supraclavicular excisional lymph node biopsy showed high-grade B-cell lymphoma germinal center type Ki-67 greater than 95%.  FISH testing was positive for Bcl-2 BCL6 and MYC consistent with triple hit lymphoma.  By NCCN IPI score would be 4 based on age and elevated LDH and stage IV (intramuscular biceps femoris lesion) which puts him in the high intermediate risk group. 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   Patient received RCHOP for cycle 1 with plans for [facility] with cycle 2. IT MTX for CNS prophylaxis and he has received 3 cycles but declined further cycles.  MRI brain negative for lymphoma.  Chemotherapy was not being able to escalated upwards due to neutropenia which has persisted to 3 weeks and he has been receiving treatment every 4 weeks   PET  CT scan after 3 cycles of chemotherapy showedComplete or near complete metabolic response.  Residual disease in the anterior upper right mediastinum slightly greater than background mediastinal activity   Patient found to have a clinically palpable right supraclavicular lymph node which was subsequently biopsied and was consistent with previously diagnosed lymphoma.  PET CT scan showedNew right level 5 adenopathy in the lower neck with dominant lymph node 1.6 cm Deauville 5.  No other findings of acute active lymphoma in chest abdomen pelvis and bones.  MRI brain negative for leptomeningeal disease.   Patient seen for second opinion by Dr. Gaylan at [facility] for consideration of CAR-T cell therapy.  Patient did not wish to proceed with CAR-T cell treatment after reviewing risks versus benefits.  Moreover patient's health insurance will also not cover CAR-T cell therapy. Patient started on second line tafasitamab /Revlimid  regimen in June 2023.  He is on Revlimid  15 mg 3 weeks on and 1 week off.  Disease progression after 3 cycles and patient was switched to [facility] regimen     Patient was noted to haveSignificant decrease in the size of supraclavicular lymph nodes consistent with treatment response Deauville 3 after 3 cycles of Pola BR chemotherapy.  Patient has only received 4 cycles of chemotherapy so far due to persistent neutropenia despite receiving growth factor support.  Repeat PET scan showed new/recurrent hypermetabolic subcutaneous tissue in the high right back/inferior neck consistent with recurrent lymphoma Deauville 5.  He received palliative radiation to that area.  Patient was started on Zynlonta  in June 2024 after he was found to have recurrent disease involving  subcutaneous right scapular region.  Patient desired treatment break and therefore treatment was held again in November 2024 when PET scan showed complete resolution of disease.  Treatment was restarted in March 2025 when he had recurrent  disease noted on PET scan    Interval history-skin rash especially over his forearm is better with topical steroids.  He also took a few days of doxycycline  but then stopped taking it.  He does get this rash on and off with chemotherapy which ultimately becomes hyperpigmented and eventually resolves.  He denies any new complaints at this time.  ECOG PS- 0 Pain scale- 0  Review of systems- Review of Systems  Constitutional:  Negative for chills, fever, malaise/fatigue and weight loss.  HENT:  Negative for congestion, ear discharge and nosebleeds.   Eyes:  Negative for blurred vision.  Respiratory:  Negative for cough, hemoptysis, sputum production, shortness of breath and wheezing.   Cardiovascular:  Negative for chest pain, palpitations, orthopnea and claudication.  Gastrointestinal:  Negative for abdominal pain, blood in stool, constipation, diarrhea, heartburn, melena, nausea and vomiting.  Genitourinary:  Negative for dysuria, flank pain, frequency, hematuria and urgency.  Musculoskeletal:  Negative for back pain, joint pain and myalgias.  Skin:  Negative for rash.  Neurological:  Negative for dizziness, tingling, focal weakness, seizures, weakness and headaches.  Endo/Heme/Allergies:  Does not bruise/bleed easily.  Psychiatric/Behavioral:  Negative for depression and suicidal ideas. The patient does not have insomnia.       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     Past Surgical History:  Procedure Laterality Date   BONE MARROW BIOPSY     COLONOSCOPY     EXCISION MASS NECK Right 06/12/2020   Procedure: EXCISION MASS NECK;  Surgeon: [name], MD;  Location: [facility] ORS;  Service: General;  Laterality: Right;   HERNIA REPAIR Right    at age 1-RIH   2 CATH INSERTION     PORTACATH PLACEMENT Right 06/24/2020   Procedure: INSERTION  PORT-A-CATH;  Surgeon: [name], MD;  Location: [facility] ORS;  Service: General;  Laterality: Right;    Social History   Socioeconomic History   Marital status: Single    Spouse name: Not on file   Number of children: Not on file   Years of education: Not on file   Highest education level: Not on file  Occupational History   Not on file  Tobacco Use   Smoking status: Former    Current packs/day: 0.00    Types: Cigarettes    Quit date: 06/13/2020    Years since quitting: 3.3   Smokeless tobacco: Never  Vaping Use   Vaping status: Never Used  Substance and Sexual Activity   Alcohol use: Not Currently   Drug use: Not Currently    Types: Marijuana   Sexual activity: Not Currently  Other Topics Concern   Not on file  Social History Narrative   Has daughter and grandson in the home   Social Drivers of Health   Financial Resource Strain: Medium Risk (07/30/2021)   Received from [facility]   Overall Financial Resource Strain (CARDIA)    Difficulty of Paying Living Expenses: Somewhat hard  Food Insecurity: No Food Insecurity (07/30/2021)   Received from [facility]   Hunger Vital Sign    Within  the past 12 months, you worried that your food would run out before you got the money to buy more.: Never true    Within the past 12 months, the food you bought just didn't last and you didn't have money to get more.: Never true  Transportation Needs: No Transportation Needs (07/30/2021)   Received from [facility]   PRAPARE - Transportation    Lack of Transportation (Medical): No    Lack of Transportation (Non-Medical): No  Physical Activity: Not on file  Stress: No Stress Concern Present (07/30/2021)   Received from [facility] - Occupational Stress Questionnaire    Feeling of Stress : Not at all  Social Connections: Not on file  Intimate Partner Violence: Not on file    Family History  Problem Relation Age of Onset    Anemia Mother    Hypertension Mother    Goiter Mother    Cancer Father    Cancer Sister    Multiple sclerosis Sister      Current Outpatient Medications:    atenolol (TENORMIN) 25 MG tablet, Take 25 mg by mouth daily., Disp: , Rfl:    cholecalciferol (VITAMIN D3) 25 MCG (1000 UNIT) tablet, Take 1,000 Units by mouth daily., Disp: , Rfl:    diphenhydrAMINE  (BENADRYL ) 2 % cream, Apply 1 Application topically 3 (three) times daily as needed for itching., Disp: 30 g, Rfl: 3   diphenhydrAMINE  (BENADRYL ) 25 mg capsule, Take 25 mg by mouth every 6 (six) hours as needed for itching or allergies., Disp: , Rfl:    doxycycline  (VIBRAMYCIN ) 50 MG capsule, Take 1 capsule (50 mg total) by mouth daily., Disp: 30 capsule, Rfl: 0   lisinopril (ZESTRIL) 10 MG tablet, Take 10 mg by mouth daily., Disp: , Rfl:    Omega-3 Fatty Acids (FISH OIL ADULT GUMMIES PO), Take by mouth., Disp: , Rfl:    predniSONE  (DELTASONE ) 10 MG tablet, Take 5 tablets (50 mg) by mouth once daily on Day 1. Then take 4 tablets (40 mg) on Day 2, then take 3 tablets (30 mg) on Day 3, then take 2 tablets (20 mg) on Day 4, then take 1 tablet (10 mg) on Day 5, then take 1/2 tablet (5 mg) on Day 6, then stop., Disp: 16 tablet, Rfl: 0   aspirin  EC 81 MG tablet, Take 1 tablet (81 mg total) by mouth daily. Swallow whole. (Patient not taking: Reported on 10/17/2023), Disp: 30 tablet, Rfl: 0   ondansetron  (ZOFRAN ) 8 MG tablet, Take 1 tablet (8 mg total) by mouth every 8 (eight) hours as needed for nausea or vomiting. (Patient not taking: Reported on 10/17/2023), Disp: 30 tablet, Rfl: 1   potassium chloride  SA (KLOR-CON  M) 20 MEQ tablet, Take 1 tablet (20 mEq total) by mouth daily. (Patient not taking: Reported on 10/17/2023), Disp: 21 tablet, Rfl: 0   prochlorperazine  (COMPAZINE ) 10 MG tablet, Take 1 tablet (10 mg total) by mouth every 6 (six) hours as needed for nausea or vomiting. (Patient not taking: Reported on 10/17/2023), Disp: 30 tablet, Rfl: 1    Tenofovir  Alafenamide Fumarate (VEMLIDY ) 25 MG TABS, Take 1 tablet (25 mg total) by mouth daily. Take with food. (Patient not taking: Reported on 10/17/2023), Disp: 30 tablet, Rfl: 2   triamcinolone  ointment (KENALOG ) 0.5 %, Apply 1 Application topically 2 (two) times daily., Disp: 30 g, Rfl: 3   vitamin C (ASCORBIC ACID) 500 MG tablet, Take 500 mg by mouth daily., Disp: ,  Rfl:  No current facility-administered medications for this visit.  Facility-Administered Medications Ordered in Other Visits:    0.9 %  sodium chloride  infusion, , Intravenous, Continuous, Brahmanday, Govinda R, MD, Stopped at 09/11/20 1502   heparin  lock flush 100 unit/mL, 500 Units, Intravenous, Once, [name], MD   heparin  lock flush 100 unit/mL, 500 Units, Intravenous, Once, [name], MD   heparin  lock flush 100 unit/mL, 500 Units, Intracatheter, Once PRN, Hamsini Verrilli C, MD   methotrexate  (PF) 12 mg in sodium chloride  (PF) 0.9 % INTRATHECAL chemo injection, , Intrathecal, Once, [name], MD   sodium chloride  flush (NS) 0.9 % injection 10 mL, 10 mL, Intracatheter, PRN, [name], MD, 10 mL at 01/18/23 1545   sodium chloride  flush (NS) 0.9 % injection 10 mL, 10 mL, Intravenous, Once, [name], MD  Physical exam:  Vitals:   10/17/23 0846  BP: (!) 157/84  Pulse: (!) 57  Resp: 18  Temp: (!) 96 F (35.6 C)  TempSrc: Tympanic  SpO2: 100%  Weight: 169 lb 4.8 oz (76.8 kg)  Height: 6' 1 (1.854 m)   Physical Exam Cardiovascular:     Rate and Rhythm: Normal rate and regular rhythm.     Heart sounds: Normal heart sounds.  Pulmonary:     Effort: Pulmonary effort is normal.     Breath sounds: Normal breath sounds.  Skin:    General: Skin is warm and dry.     Comments: Circular hyperpigmented macular lesions noted over bilateral forearms which appear to be healing  Neurological:     Mental Status: He is alert and oriented to person, place, and time.      I have personally reviewed labs  listed below:    Latest Ref Rng & Units 10/17/2023    8:33 AM  CMP  Glucose 70 - 99 mg/dL 870   BUN 8 - 23 mg/dL 9   Creatinine 9.38 - 8.75 mg/dL 8.93   Sodium 864 - 854 mmol/L 137   Potassium 3.5 - 5.1 mmol/L 3.5   Chloride 98 - 111 mmol/L 104   CO2 22 - 32 mmol/L 25   Calcium 8.9 - 10.3 mg/dL 9.3   Total Protein 6.5 - 8.1 g/dL 6.5   Total Bilirubin 0.0 - 1.2 mg/dL 0.7   Alkaline Phos 38 - 126 U/L 109   AST 15 - 41 U/L 58   ALT 0 - 44 U/L 28       Latest Ref Rng & Units 10/17/2023    8:33 AM  CBC  WBC 4.0 - 10.5 K/uL 3.0   Hemoglobin 13.0 - 17.0 g/dL 87.6   Hematocrit 60.9 - 52.0 % 36.4   Platelets 150 - 400 K/uL 124     Assessment and plan- Patient is a 63 y.o. male with history of relapsed triple diffuse large B-cell lymphoma here for on treatment assessment prior to cycle 14 of Zynlonta   Patient had a PET CT scan on 09/06/2023 which showed significant improvement in the lymphoma burden as evidenced by resolution of hypermetabolic bone lesions as well as resolution of liver lesions.  Lesion involving the right deltoid muscle and subcutaneous fat was also improving.  The only lesion that appeared to increase was the right humeral neck lesion with an SUV of 9.2.  I will continue with Zynlonta  at this time until progression or toxicity and consider repeat PET scan in about 6 to 8 weeks time.  If there is any further  worsening of the right humeral neck lesion I will consider offering palliative radiation to that area.  Counts okay to proceed with cycle 14 of Zynlonta  today with udenyca  on day 3.  I will see him back in 3 weeks for cycle 15   Visit Diagnosis 1. High grade B-cell lymphoma (HCC)   2. Encounter for antineoplastic chemotherapy      Dr. Annah Skene, MD, MPH [facility] at [facility] [phone] 10/17/2023 12:48 PM

## 2023-10-19 ENCOUNTER — Ambulatory Visit

## 2023-10-19 ENCOUNTER — Inpatient Hospital Stay

## 2023-10-19 DIAGNOSIS — Z5112 Encounter for antineoplastic immunotherapy: Secondary | ICD-10-CM | POA: Diagnosis not present

## 2023-10-19 DIAGNOSIS — C8510 Unspecified B-cell lymphoma, unspecified site: Secondary | ICD-10-CM

## 2023-10-19 MED ORDER — PEGFILGRASTIM-FPGK 6 MG/0.6ML ~~LOC~~ SOSY
6.0000 mg | PREFILLED_SYRINGE | Freq: Once | SUBCUTANEOUS | Status: AC
Start: 2023-10-19 — End: 2023-10-19
  Administered 2023-10-19: 6 mg via SUBCUTANEOUS
  Filled 2023-10-19: qty 0.6

## 2023-10-24 ENCOUNTER — Other Ambulatory Visit: Payer: Medicaid Other

## 2023-11-07 ENCOUNTER — Inpatient Hospital Stay

## 2023-11-07 ENCOUNTER — Encounter: Payer: Self-pay | Admitting: Oncology

## 2023-11-07 ENCOUNTER — Other Ambulatory Visit: Payer: Self-pay

## 2023-11-07 ENCOUNTER — Inpatient Hospital Stay (HOSPITAL_BASED_OUTPATIENT_CLINIC_OR_DEPARTMENT_OTHER): Admitting: Oncology

## 2023-11-07 VITALS — BP 129/72 | HR 70 | Temp 98.7°F | Resp 20 | Wt 168.0 lb

## 2023-11-07 DIAGNOSIS — C8510 Unspecified B-cell lymphoma, unspecified site: Secondary | ICD-10-CM | POA: Diagnosis not present

## 2023-11-07 DIAGNOSIS — Z5111 Encounter for antineoplastic chemotherapy: Secondary | ICD-10-CM | POA: Diagnosis not present

## 2023-11-07 DIAGNOSIS — Z5112 Encounter for antineoplastic immunotherapy: Secondary | ICD-10-CM | POA: Diagnosis not present

## 2023-11-07 LAB — CBC WITH DIFFERENTIAL (CANCER CENTER ONLY)
Abs Immature Granulocytes: 0.2 K/uL — ABNORMAL HIGH (ref 0.00–0.07)
Basophils Absolute: 0 K/uL (ref 0.0–0.1)
Basophils Relative: 1 %
Eosinophils Absolute: 0 K/uL (ref 0.0–0.5)
Eosinophils Relative: 1 %
HCT: 37.3 % — ABNORMAL LOW (ref 39.0–52.0)
Hemoglobin: 12.4 g/dL — ABNORMAL LOW (ref 13.0–17.0)
Immature Granulocytes: 4 %
Lymphocytes Relative: 22 %
Lymphs Abs: 1.1 K/uL (ref 0.7–4.0)
MCH: 31.9 pg (ref 26.0–34.0)
MCHC: 33.2 g/dL (ref 30.0–36.0)
MCV: 95.9 fL (ref 80.0–100.0)
Monocytes Absolute: 0.4 K/uL (ref 0.1–1.0)
Monocytes Relative: 9 %
Neutro Abs: 3 K/uL (ref 1.7–7.7)
Neutrophils Relative %: 63 %
Platelet Count: 114 K/uL — ABNORMAL LOW (ref 150–400)
RBC: 3.89 MIL/uL — ABNORMAL LOW (ref 4.22–5.81)
RDW: 15.1 % (ref 11.5–15.5)
WBC Count: 4.7 K/uL (ref 4.0–10.5)
nRBC: 0 % (ref 0.0–0.2)

## 2023-11-07 LAB — CMP (CANCER CENTER ONLY)
ALT: 22 U/L (ref 0–44)
AST: 45 U/L — ABNORMAL HIGH (ref 15–41)
Albumin: 3.6 g/dL (ref 3.5–5.0)
Alkaline Phosphatase: 121 U/L (ref 38–126)
Anion gap: 7 (ref 5–15)
BUN: 9 mg/dL (ref 8–23)
CO2: 24 mmol/L (ref 22–32)
Calcium: 9 mg/dL (ref 8.9–10.3)
Chloride: 106 mmol/L (ref 98–111)
Creatinine: 0.91 mg/dL (ref 0.61–1.24)
GFR, Estimated: >60 mL/min (ref >60)
Glucose, Bld: 134 mg/dL — ABNORMAL HIGH (ref 70–99)
Potassium: 3.3 mmol/L — ABNORMAL LOW (ref 3.5–5.1)
Sodium: 137 mmol/L (ref 135–145)
Total Bilirubin: 0.8 mg/dL (ref 0.0–1.2)
Total Protein: 6.3 g/dL — ABNORMAL LOW (ref 6.5–8.1)

## 2023-11-07 MED ORDER — SODIUM CHLORIDE 0.9% FLUSH
10.0000 mL | Freq: Once | INTRAVENOUS | Status: AC
  Administered 2023-11-07: 10 mL via INTRAVENOUS
  Filled 2023-11-07: qty 10

## 2023-11-07 MED ORDER — PREDNISONE 10 MG PO TABS
ORAL_TABLET | ORAL | 0 refills | Status: DC
  Filled 2023-11-07: qty 16, 6d supply, fill #0

## 2023-11-07 MED ORDER — LONCASTUXIMAB TESIRINE-LPYL CHEMO INJECTION 10 MG
0.0750 mg/kg | Freq: Once | INTRAVENOUS | Status: AC
  Administered 2023-11-07: 6.1 mg via INTRAVENOUS
  Filled 2023-11-07: qty 1.22

## 2023-11-07 MED ORDER — DEXAMETHASONE SODIUM PHOSPHATE 10 MG/ML IJ SOLN
10.0000 mg | Freq: Once | INTRAMUSCULAR | Status: AC
  Administered 2023-11-07: 10 mg via INTRAVENOUS
  Filled 2023-11-07: qty 1

## 2023-11-07 MED ORDER — DEXTROSE 5 % IV SOLN
Freq: Once | INTRAVENOUS | Status: AC
  Filled 2023-11-07: qty 250

## 2023-11-07 NOTE — Progress Notes (Signed)
 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  This was followed by a PET CT scan which showed extensive FDG avid adenopathy in the neck chest abdomen and pelvis.  FDG avid splenic lesions.  Solitary intramuscular FDG avid lesion in the right biceps femoris muscle.   Supraclavicular excisional lymph node biopsy showed high-grade B-cell lymphoma germinal center type Ki-67 greater than 95%.  FISH testing was positive for Bcl-2 BCL6 and MYC consistent with triple hit lymphoma.  By NCCN IPI score would be 4 based on age and elevated LDH and stage IV (intramuscular biceps femoris lesion) which puts him in the high intermediate risk group. 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   Patient received RCHOP for cycle 1 with plans for [facility] with cycle 2. IT MTX for CNS prophylaxis and he has received 3 cycles but declined further cycles.  MRI brain negative for lymphoma.  Chemotherapy was not being able to escalated upwards due to neutropenia which has persisted to 3 weeks and he has been receiving treatment every 4 weeks   PET CT  scan after 3 cycles of chemotherapy showedComplete or near complete metabolic response.  Residual disease in the anterior upper right mediastinum slightly greater than background mediastinal activity   Patient found to have a clinically palpable right supraclavicular lymph node which was subsequently biopsied and was consistent with previously diagnosed lymphoma.  PET CT scan showedNew right level 5 adenopathy in the lower neck with dominant lymph node 1.6 cm Deauville 5.  No other findings of acute active lymphoma in chest abdomen pelvis and bones.  MRI brain negative for leptomeningeal disease.   Patient seen for second opinion by Dr. Gaylan at [facility] for consideration of CAR-T cell therapy.  Patient did not wish to proceed with CAR-T cell treatment after reviewing risks versus benefits.  Moreover patient's health insurance will also not cover CAR-T cell therapy. Patient started on second line tafasitamab /Revlimid  regimen in June 2023.  He is on Revlimid  15 mg 3 weeks on and 1 week off.  Disease progression after 3 cycles and patient was switched to [facility] regimen     Patient was noted to haveSignificant decrease in the size of supraclavicular lymph nodes consistent with treatment response Deauville 3 after 3 cycles of Pola BR chemotherapy.  Patient has only received 4 cycles of chemotherapy so far due to persistent neutropenia despite receiving growth factor support.  Repeat PET scan showed new/recurrent hypermetabolic subcutaneous tissue in the high right back/inferior neck consistent with recurrent lymphoma Deauville 5.  He received palliative radiation to that area.  Patient was started on Zynlonta  in June 2024 after he was found to have recurrent disease involving subcutaneous right scapular  region.  Patient desired treatment break and therefore treatment was held again in November 2024 when PET scan showed complete resolution of disease.  Treatment was restarted in March 2025 when he had recurrent  disease noted on PET scan    Interval history-tolerating chemotherapy well so far.  No changes in appetite orWeight.  He gets photosensitive rash after chemotherapy which is self-limited  ECOG PS- 0 Pain scale- 0   Review of systems- Review of Systems  Constitutional:  Negative for chills, fever, malaise/fatigue and weight loss.  HENT:  Negative for congestion, ear discharge and nosebleeds.   Eyes:  Negative for blurred vision.  Respiratory:  Negative for cough, hemoptysis, sputum production, shortness of breath and wheezing.   Cardiovascular:  Negative for chest pain, palpitations, orthopnea and claudication.  Gastrointestinal:  Negative for abdominal pain, blood in stool, constipation, diarrhea, heartburn, melena, nausea and vomiting.  Genitourinary:  Negative for dysuria, flank pain, frequency, hematuria and urgency.  Musculoskeletal:  Negative for back pain, joint pain and myalgias.  Skin:  Negative for rash.  Neurological:  Negative for dizziness, tingling, focal weakness, seizures, weakness and headaches.  Endo/Heme/Allergies:  Does not bruise/bleed easily.  Psychiatric/Behavioral:  Negative for depression and suicidal ideas. The patient does not have insomnia.       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     Past Surgical History:  Procedure Laterality Date   BONE MARROW BIOPSY     COLONOSCOPY     EXCISION MASS NECK Right 06/12/2020   Procedure: EXCISION MASS NECK;  Surgeon: [name], MD;  Location: [facility] ORS;  Service: General;  Laterality: Right;   HERNIA REPAIR Right    at age 65-RIH   60 CATH INSERTION     PORTACATH PLACEMENT Right 06/24/2020   Procedure: INSERTION PORT-A-CATH;  Surgeon: [name], MD;  Location: [facility] ORS;  Service: General;  Laterality: Right;    Social History   Socioeconomic History    Marital status: Single    Spouse name: Not on file   Number of children: Not on file   Years of education: Not on file   Highest education level: Not on file  Occupational History   Not on file  Tobacco Use   Smoking status: Former    Current packs/day: 0.00    Types: Cigarettes    Quit date: 06/13/2020    Years since quitting: 3.4   Smokeless tobacco: Never  Vaping Use   Vaping status: Never Used  Substance and Sexual Activity   Alcohol use: Not Currently   Drug use: Not Currently    Types: Marijuana   Sexual activity: Not Currently  Other Topics Concern   Not on file  Social History Narrative   Has daughter and grandson in the home   Social Drivers of Health   Financial Resource Strain: Medium Risk (07/30/2021)   Received from [facility]   Overall Financial Resource Strain (CARDIA)    Difficulty of Paying Living Expenses: Somewhat hard  Food Insecurity: No Food Insecurity (07/30/2021)   Received from [facility]   Hunger Vital Sign    Within the past 12 months, you worried that your food would run out before you got the money to buy more.: Never true    Within the past 12 months, the food you  bought just didn't last and you didn't have money to get more.: Never true  Transportation Needs: No Transportation Needs (07/30/2021)   Received from [facility] - Transportation    Lack of Transportation (Medical): No    Lack of Transportation (Non-Medical): No  Physical Activity: Not on file  Stress: No Stress Concern Present (07/30/2021)   Received from [facility] - Occupational Stress Questionnaire    Feeling of Stress : Not at all  Social Connections: Not on file  Intimate Partner Violence: Not on file    Family History  Problem Relation Age of Onset   Anemia Mother    Hypertension Mother    Goiter Mother    Cancer Father    Cancer Sister    Multiple sclerosis Sister      Current Outpatient  Medications:    atenolol (TENORMIN) 25 MG tablet, Take 25 mg by mouth daily., Disp: , Rfl:    cholecalciferol (VITAMIN D3) 25 MCG (1000 UNIT) tablet, Take 1,000 Units by mouth daily., Disp: , Rfl:    diphenhydrAMINE  (BENADRYL ) 2 % cream, Apply 1 Application topically 3 (three) times daily as needed for itching., Disp: 30 g, Rfl: 3   diphenhydrAMINE  (BENADRYL ) 25 mg capsule, Take 25 mg by mouth every 6 (six) hours as needed for itching or allergies., Disp: , Rfl:    doxycycline  (VIBRAMYCIN ) 50 MG capsule, Take 1 capsule (50 mg total) by mouth daily., Disp: 30 capsule, Rfl: 0   lisinopril (ZESTRIL) 10 MG tablet, Take 10 mg by mouth daily., Disp: , Rfl:    Omega-3 Fatty Acids (FISH OIL ADULT GUMMIES PO), Take by mouth., Disp: , Rfl:    triamcinolone  ointment (KENALOG ) 0.5 %, Apply 1 Application topically 2 (two) times daily., Disp: 30 g, Rfl: 3   vitamin C (ASCORBIC ACID) 500 MG tablet, Take 500 mg by mouth daily., Disp: , Rfl:    aspirin  EC 81 MG tablet, Take 1 tablet (81 mg total) by mouth daily. Swallow whole. (Patient not taking: Reported on 11/07/2023), Disp: 30 tablet, Rfl: 0   ondansetron  (ZOFRAN ) 8 MG tablet, Take 1 tablet (8 mg total) by mouth every 8 (eight) hours as needed for nausea or vomiting. (Patient not taking: Reported on 11/07/2023), Disp: 30 tablet, Rfl: 1   potassium chloride  SA (KLOR-CON  M) 20 MEQ tablet, Take 1 tablet (20 mEq total) by mouth daily. (Patient not taking: Reported on 11/07/2023), Disp: 21 tablet, Rfl: 0   predniSONE  (DELTASONE ) 10 MG tablet, Take 5 tablets (50 mg) by mouth once daily on Day 1. Then take 4 tablets (40 mg) on Day 2, then take 3 tablets (30 mg) on Day 3, then take 2 tablets (20 mg) on Day 4, then take 1 tablet (10 mg) on Day 5, then take 1/2 tablet (5 mg) on Day 6, then stop., Disp: 16 tablet, Rfl: 0   prochlorperazine  (COMPAZINE ) 10 MG tablet, Take 1 tablet (10 mg total) by mouth every 6 (six) hours as needed for nausea or vomiting. (Patient not taking:  Reported on 11/07/2023), Disp: 30 tablet, Rfl: 1   Tenofovir  Alafenamide Fumarate (VEMLIDY ) 25 MG TABS, Take 1 tablet (25 mg total) by mouth daily. Take with food. (Patient not taking: Reported on 11/07/2023), Disp: 30 tablet, Rfl: 2 No current facility-administered medications for this visit.  Facility-Administered Medications Ordered in Other Visits:    0.9 %  sodium chloride  infusion, , Intravenous, Continuous, Rennie, Govinda R, MD,  Stopped at 09/11/20 1502   heparin  lock flush 100 unit/mL, 500 Units, Intravenous, Once, [name], MD   heparin  lock flush 100 unit/mL, 500 Units, Intravenous, Once, [name], MD   methotrexate  (PF) 12 mg in sodium chloride  (PF) 0.9 % INTRATHECAL chemo injection, , Intrathecal, Once, [name], MD   sodium chloride  flush (NS) 0.9 % injection 10 mL, 10 mL, Intracatheter, PRN, [name], MD, 10 mL at 01/18/23 1545   sodium chloride  flush (NS) 0.9 % injection 10 mL, 10 mL, Intravenous, Once, [name], MD  Physical exam:  Vitals:   11/07/23 0853  BP: 129/72  Pulse: 70  Resp: 20  Temp: 98.7 F (37.1 C)  SpO2: 100%  Weight: 168 lb (76.2 kg)   Physical Exam Cardiovascular:     Rate and Rhythm: Normal rate and regular rhythm.     Heart sounds: Normal heart sounds.  Pulmonary:     Effort: Pulmonary effort is normal.     Breath sounds: Normal breath sounds.  Skin:    General: Skin is warm and dry.     Comments: Persistent palpable subcutaneous mass in the right arm  Neurological:     Mental Status: He is alert and oriented to person, place, and time.      I have personally reviewed labs listed below:    Latest Ref Rng & Units 11/07/2023    8:37 AM  CMP  Glucose 70 - 99 mg/dL 865   BUN 8 - 23 mg/dL 9   Creatinine 9.38 - 8.75 mg/dL 9.08   Sodium 864 - 854 mmol/L 137   Potassium 3.5 - 5.1 mmol/L 3.3   Chloride 98 - 111 mmol/L 106   CO2 22 - 32 mmol/L 24   Calcium 8.9 - 10.3 mg/dL 9.0   Total Protein 6.5 - 8.1 g/dL 6.3    Total Bilirubin 0.0 - 1.2 mg/dL 0.8   Alkaline Phos 38 - 126 U/L 121   AST 15 - 41 U/L 45   ALT 0 - 44 U/L 22       Latest Ref Rng & Units 11/07/2023    8:37 AM  CBC  WBC 4.0 - 10.5 K/uL 4.7   Hemoglobin 13.0 - 17.0 g/dL 87.5   Hematocrit 60.9 - 52.0 % 37.3   Platelets 150 - 400 K/uL 114      Assessment and plan- Patient is a 63 y.o. male with history of relapsed triple diffuse large B-cell lymphoma.  He is here for on treatment assessment prior to cycle 15 of Zynlonta   Counts okay to proceed with cycle 15Of Zynlonta  today.  I will see him back in 3 weeks for cycle 16.  Plan is to continue present regimen until progression or toxicity.  Will plan to get repeat PET scan in October 2025.  He does get growth factor support with each treatment  AST mildly elevated at 45.  Continue to monitor   Visit Diagnosis 1. High grade B-cell lymphoma (HCC)   2. Encounter for antineoplastic chemotherapy      Dr. Annah Melanee, MD, MPH [facility] [phone] 11/07/2023 11:59 AM

## 2023-11-09 ENCOUNTER — Inpatient Hospital Stay

## 2023-11-09 DIAGNOSIS — Z5112 Encounter for antineoplastic immunotherapy: Secondary | ICD-10-CM | POA: Diagnosis not present

## 2023-11-09 DIAGNOSIS — C8510 Unspecified B-cell lymphoma, unspecified site: Secondary | ICD-10-CM

## 2023-11-09 MED ORDER — PEGFILGRASTIM-FPGK 6 MG/0.6ML ~~LOC~~ SOSY
6.0000 mg | PREFILLED_SYRINGE | Freq: Once | SUBCUTANEOUS | Status: AC
  Administered 2023-11-09: 6 mg via SUBCUTANEOUS
  Filled 2023-11-09: qty 0.6

## 2023-11-10 ENCOUNTER — Encounter: Payer: Self-pay | Admitting: Oncology

## 2023-11-28 ENCOUNTER — Inpatient Hospital Stay

## 2023-11-28 ENCOUNTER — Encounter: Payer: Self-pay | Admitting: Oncology

## 2023-11-28 ENCOUNTER — Inpatient Hospital Stay: Attending: Oncology

## 2023-11-28 ENCOUNTER — Inpatient Hospital Stay (HOSPITAL_BASED_OUTPATIENT_CLINIC_OR_DEPARTMENT_OTHER): Admitting: Oncology

## 2023-11-28 VITALS — BP 153/75 | HR 60 | Temp 96.0°F | Resp 18 | Ht 73.0 in | Wt 166.3 lb

## 2023-11-28 DIAGNOSIS — Z5189 Encounter for other specified aftercare: Secondary | ICD-10-CM | POA: Insufficient documentation

## 2023-11-28 DIAGNOSIS — Z8249 Family history of ischemic heart disease and other diseases of the circulatory system: Secondary | ICD-10-CM | POA: Diagnosis not present

## 2023-11-28 DIAGNOSIS — Z5986 Financial insecurity: Secondary | ICD-10-CM | POA: Diagnosis not present

## 2023-11-28 DIAGNOSIS — Z79899 Other long term (current) drug therapy: Secondary | ICD-10-CM | POA: Diagnosis not present

## 2023-11-28 DIAGNOSIS — Z5112 Encounter for antineoplastic immunotherapy: Secondary | ICD-10-CM | POA: Insufficient documentation

## 2023-11-28 DIAGNOSIS — Z809 Family history of malignant neoplasm, unspecified: Secondary | ICD-10-CM | POA: Insufficient documentation

## 2023-11-28 DIAGNOSIS — R590 Localized enlarged lymph nodes: Secondary | ICD-10-CM | POA: Diagnosis not present

## 2023-11-28 DIAGNOSIS — C8510 Unspecified B-cell lymphoma, unspecified site: Secondary | ICD-10-CM | POA: Diagnosis not present

## 2023-11-28 DIAGNOSIS — D709 Neutropenia, unspecified: Secondary | ICD-10-CM | POA: Insufficient documentation

## 2023-11-28 DIAGNOSIS — Z87891 Personal history of nicotine dependence: Secondary | ICD-10-CM | POA: Diagnosis not present

## 2023-11-28 DIAGNOSIS — D696 Thrombocytopenia, unspecified: Secondary | ICD-10-CM | POA: Insufficient documentation

## 2023-11-28 DIAGNOSIS — Z8269 Family history of other diseases of the musculoskeletal system and connective tissue: Secondary | ICD-10-CM | POA: Insufficient documentation

## 2023-11-28 DIAGNOSIS — Z820 Family history of epilepsy and other diseases of the nervous system: Secondary | ICD-10-CM | POA: Insufficient documentation

## 2023-11-28 DIAGNOSIS — C8338 Diffuse large B-cell lymphoma, lymph nodes of multiple sites: Secondary | ICD-10-CM | POA: Insufficient documentation

## 2023-11-28 DIAGNOSIS — Z7982 Long term (current) use of aspirin: Secondary | ICD-10-CM | POA: Diagnosis not present

## 2023-11-28 DIAGNOSIS — I10 Essential (primary) hypertension: Secondary | ICD-10-CM | POA: Insufficient documentation

## 2023-11-28 DIAGNOSIS — Z5111 Encounter for antineoplastic chemotherapy: Secondary | ICD-10-CM | POA: Diagnosis not present

## 2023-11-28 DIAGNOSIS — R7989 Other specified abnormal findings of blood chemistry: Secondary | ICD-10-CM | POA: Diagnosis not present

## 2023-11-28 LAB — CBC WITH DIFFERENTIAL (CANCER CENTER ONLY)
Abs Immature Granulocytes: 0.12 K/uL — ABNORMAL HIGH (ref 0.00–0.07)
Basophils Absolute: 0.1 K/uL (ref 0.0–0.1)
Basophils Relative: 1 %
Eosinophils Absolute: 0.1 K/uL (ref 0.0–0.5)
Eosinophils Relative: 1 %
HCT: 38 % — ABNORMAL LOW (ref 39.0–52.0)
Hemoglobin: 12.7 g/dL — ABNORMAL LOW (ref 13.0–17.0)
Immature Granulocytes: 2 %
Lymphocytes Relative: 24 %
Lymphs Abs: 1.4 K/uL (ref 0.7–4.0)
MCH: 32.1 pg (ref 26.0–34.0)
MCHC: 33.4 g/dL (ref 30.0–36.0)
MCV: 96 fL (ref 80.0–100.0)
Monocytes Absolute: 0.4 K/uL (ref 0.1–1.0)
Monocytes Relative: 8 %
Neutro Abs: 3.7 K/uL (ref 1.7–7.7)
Neutrophils Relative %: 64 %
Platelet Count: 121 K/uL — ABNORMAL LOW (ref 150–400)
RBC: 3.96 MIL/uL — ABNORMAL LOW (ref 4.22–5.81)
RDW: 15.8 % — ABNORMAL HIGH (ref 11.5–15.5)
WBC Count: 5.8 K/uL (ref 4.0–10.5)
nRBC: 0 % (ref 0.0–0.2)

## 2023-11-28 LAB — CMP (CANCER CENTER ONLY)
ALT: 24 U/L (ref 0–44)
AST: 51 U/L — ABNORMAL HIGH (ref 15–41)
Albumin: 3.8 g/dL (ref 3.5–5.0)
Alkaline Phosphatase: 142 U/L — ABNORMAL HIGH (ref 38–126)
Anion gap: 8 (ref 5–15)
BUN: 9 mg/dL (ref 8–23)
CO2: 26 mmol/L (ref 22–32)
Calcium: 8.9 mg/dL (ref 8.9–10.3)
Chloride: 105 mmol/L (ref 98–111)
Creatinine: 0.86 mg/dL (ref 0.61–1.24)
GFR, Estimated: >60 mL/min (ref >60)
Glucose, Bld: 101 mg/dL — ABNORMAL HIGH (ref 70–99)
Potassium: 3.8 mmol/L (ref 3.5–5.1)
Sodium: 139 mmol/L (ref 135–145)
Total Bilirubin: 0.9 mg/dL (ref 0.0–1.2)
Total Protein: 6.4 g/dL — ABNORMAL LOW (ref 6.5–8.1)

## 2023-11-28 MED ORDER — DEXAMETHASONE SODIUM PHOSPHATE 10 MG/ML IJ SOLN
10.0000 mg | Freq: Once | INTRAMUSCULAR | Status: AC
  Administered 2023-11-28: 10 mg via INTRAVENOUS
  Filled 2023-11-28: qty 1

## 2023-11-28 MED ORDER — DEXTROSE 5 % IV SOLN
Freq: Once | INTRAVENOUS | Status: AC
  Filled 2023-11-28: qty 250

## 2023-11-28 MED ORDER — LONCASTUXIMAB TESIRINE-LPYL CHEMO INJECTION 10 MG
0.0750 mg/kg | Freq: Once | INTRAVENOUS | Status: AC
  Administered 2023-11-28: 6.1 mg via INTRAVENOUS
  Filled 2023-11-28: qty 1.22

## 2023-11-28 NOTE — Progress Notes (Signed)
 Patient states he's doing okay, he does want insight regarding eye issues he's experiencing.

## 2023-11-28 NOTE — Patient Instructions (Signed)
 [facility] - A DEPT OF [facility]  Discharge Instructions: Thank you for choosing [facility] to provide your oncology and hematology care.  If you have a lab appointment with the Cancer Center, please go directly to the Cancer Center and check in at the registration area.  Wear comfortable clothing and clothing appropriate for easy access to any Portacath or PICC line.   We strive to give you quality time with your provider. You may need to reschedule your appointment if you arrive late (15 or more minutes).  Arriving late affects you and other patients whose appointments are after yours.  Also, if you miss three or more appointments without notifying the office, you may be dismissed from the clinic at the provider's discretion.      For prescription refill requests, have your pharmacy contact our office and allow 72 hours for refills to be completed.    Today you received the following chemotherapy and/or immunotherapy agents: ZYNLONTA    To help prevent nausea and vomiting after your treatment, we encourage you to take your nausea medication as directed.  BELOW ARE SYMPTOMS THAT SHOULD BE REPORTED IMMEDIATELY: *FEVER GREATER THAN 100.4 F (38 C) OR HIGHER *CHILLS OR SWEATING *NAUSEA AND VOMITING THAT IS NOT CONTROLLED WITH YOUR NAUSEA MEDICATION *UNUSUAL SHORTNESS OF BREATH *UNUSUAL BRUISING OR BLEEDING *URINARY PROBLEMS (pain or burning when urinating, or frequent urination) *BOWEL PROBLEMS (unusual diarrhea, constipation, pain near the anus) TENDERNESS IN MOUTH AND THROAT WITH OR WITHOUT PRESENCE OF ULCERS (sore throat, sores in mouth, or a toothache) UNUSUAL RASH, SWELLING OR PAIN  UNUSUAL VAGINAL DISCHARGE OR ITCHING   Items with * indicate a potential emergency and should be followed up as soon as possible or go to the Emergency Department if any problems should occur.  Please show the CHEMOTHERAPY ALERT CARD or IMMUNOTHERAPY ALERT  CARD at check-in to the Emergency Department and triage nurse.  Should you have questions after your visit or need to cancel or reschedule your appointment, please contact [facility] - A DEPT OF [facility]  [phone] and follow the prompts.  Office hours are 8:00 a.m. to 4:30 p.m. Monday - Friday. Please note that voicemails left after 4:00 p.m. may not be returned until the following business day.  We are closed weekends and major holidays. You have access to a nurse at all times for urgent questions. Please call the main number to the clinic [phone] and follow the prompts.  For any non-urgent questions, you may also contact your provider using MyChart. We now offer e-Visits for anyone 48 and older to request care online for non-urgent symptoms. For details visit [email or web address].   Also download the MyChart app! Go to the app store, search MyChart, open the app, select [place], and log in with your MyChart username and password.

## 2023-11-28 NOTE — Progress Notes (Signed)
 Dr Melanee aware of ocular issues and will monitor in the future. Continue treatment as planned

## 2023-11-28 NOTE — Progress Notes (Signed)
 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  This was followed by a PET CT scan which showed extensive FDG avid adenopathy in the neck chest abdomen and pelvis.  FDG avid splenic lesions.  Solitary intramuscular FDG avid lesion in the right biceps femoris muscle.   Supraclavicular excisional lymph node biopsy showed high-grade B-cell lymphoma germinal center type Ki-67 greater than 95%.  FISH testing was positive for Bcl-2 BCL6 and MYC consistent with triple hit lymphoma.  By NCCN IPI score would be 4 based on age and elevated LDH and stage IV (intramuscular biceps femoris lesion) which puts him in the high intermediate risk group. 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   Patient received RCHOP for cycle 1 with plans for [facility] with cycle 2. IT MTX for CNS prophylaxis and he has received 3 cycles but declined further cycles.  MRI brain negative for lymphoma.  Chemotherapy was not being able to escalated upwards due to neutropenia which has persisted to 3 weeks and he has been receiving treatment every 4 weeks   PET CT  scan after 3 cycles of chemotherapy showedComplete or near complete metabolic response.  Residual disease in the anterior upper right mediastinum slightly greater than background mediastinal activity   Patient found to have a clinically palpable right supraclavicular lymph node which was subsequently biopsied and was consistent with previously diagnosed lymphoma.  PET CT scan showedNew right level 5 adenopathy in the lower neck with dominant lymph node 1.6 cm Deauville 5.  No other findings of acute active lymphoma in chest abdomen pelvis and bones.  MRI brain negative for leptomeningeal disease.   Patient seen for second opinion by Dr. Gaylan at [facility] for consideration of CAR-T cell therapy.  Patient did not wish to proceed with CAR-T cell treatment after reviewing risks versus benefits.  Moreover patient's health insurance will also not cover CAR-T cell therapy. Patient started on second line tafasitamab /Revlimid  regimen in June 2023.  He is on Revlimid  15 mg 3 weeks on and 1 week off.  Disease progression after 3 cycles and patient was switched to [facility] regimen     Patient was noted to haveSignificant decrease in the size of supraclavicular lymph nodes consistent with treatment response Deauville 3 after 3 cycles of Pola BR chemotherapy.  Patient has only received 4 cycles of chemotherapy so far due to persistent neutropenia despite receiving growth factor support.  Repeat PET scan showed new/recurrent hypermetabolic subcutaneous tissue in the high right back/inferior neck consistent with recurrent lymphoma Deauville 5.  He received palliative radiation to that area.  Patient was started on Zynlonta  in June 2024 after he was found to have recurrent disease involving subcutaneous right  scapular region.  Patient desired treatment break and therefore treatment was held again in November 2024 when PET scan showed complete resolution of disease.  Treatment was restarted in March 2025 when he had recurrent  disease noted on PET scan  Interval history-skin rash is presently under good control.  He sometimes has flares when the weather gets harder.  He is taking prednisone  a day prior and 2 days after chemotherapy.  He has some tingling numbness in his hands and feet which comes and goes and is overall self-limited.  He has been occasionally experiencing photophobia but denies any decrease in his visual acuity redness or tearing.  ECOG PS- 0 Pain scale- 0   Review of systems- Review of Systems  Constitutional:  Negative for chills, fever, malaise/fatigue and weight loss.  HENT:  Negative for congestion, ear discharge and nosebleeds.   Eyes:  Positive for photophobia. Negative for blurred vision.  Respiratory:  Negative for cough, hemoptysis, sputum production, shortness of breath and wheezing.   Cardiovascular:  Negative for chest pain, palpitations, orthopnea and claudication.  Gastrointestinal:  Negative for abdominal pain, blood in stool, constipation, diarrhea, heartburn, melena, nausea and vomiting.  Genitourinary:  Negative for dysuria, flank pain, frequency, hematuria and urgency.  Musculoskeletal:  Negative for back pain, joint pain and myalgias.  Skin:  Negative for rash.  Neurological:  Negative for dizziness, tingling, focal weakness, seizures, weakness and headaches.  Endo/Heme/Allergies:  Does not bruise/bleed easily.  Psychiatric/Behavioral:  Negative for depression and suicidal ideas. The patient does not have insomnia.       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     Past Surgical History:  Procedure Laterality Date   BONE MARROW BIOPSY     COLONOSCOPY     EXCISION MASS NECK Right 06/12/2020   Procedure: EXCISION MASS NECK;  Surgeon: [name], MD;  Location: [facility] ORS;  Service: General;  Laterality: Right;   HERNIA REPAIR  Right    at age 9-RIH   5 CATH INSERTION     PORTACATH PLACEMENT Right 06/24/2020   Procedure: INSERTION PORT-A-CATH;  Surgeon: [name], MD;  Location: [facility] ORS;  Service: General;  Laterality: Right;    Social History   Socioeconomic History   Marital status: Single    Spouse name: Not on file   Number of children: Not on file   Years of education: Not on file   Highest education level: Not on file  Occupational History   Not on file  Tobacco Use   Smoking status: Former    Current packs/day: 0.00    Types: Cigarettes    Quit date: 06/13/2020    Years since quitting: 3.4   Smokeless tobacco: Never  Vaping Use   Vaping status: Never Used  Substance and Sexual Activity   Alcohol use: Not Currently   Drug use: Not Currently    Types: Marijuana   Sexual activity: Not Currently  Other Topics Concern   Not on file  Social History Narrative   Has daughter and grandson in the home   Social Drivers of Health   Financial Resource Strain: Medium Risk (07/30/2021)   Received from [facility]   Overall Financial Resource Strain (CARDIA)    Difficulty of Paying Living Expenses: Somewhat hard  Food Insecurity: No Food Insecurity (07/30/2021)  Received from [facility]   Hunger Vital Sign    Within the past 12 months, you worried that your food would run out before you got the money to buy more.: Never true    Within the past 12 months, the food you bought just didn't last and you didn't have money to get more.: Never true  Transportation Needs: No Transportation Needs (07/30/2021)   Received from [facility]   PRAPARE - Transportation    Lack of Transportation (Medical): No    Lack of Transportation (Non-Medical): No  Physical Activity: Not on file  Stress: No Stress Concern Present (07/30/2021)   Received from [facility] - Occupational Stress Questionnaire    Feeling of Stress : Not at all  Social  Connections: Not on file  Intimate Partner Violence: Not on file    Family History  Problem Relation Age of Onset   Anemia Mother    Hypertension Mother    Goiter Mother    Cancer Father    Cancer Sister    Multiple sclerosis Sister      Current Outpatient Medications:    atenolol (TENORMIN) 25 MG tablet, Take 25 mg by mouth daily., Disp: , Rfl:    cholecalciferol (VITAMIN D3) 25 MCG (1000 UNIT) tablet, Take 1,000 Units by mouth daily., Disp: , Rfl:    diphenhydrAMINE  (BENADRYL ) 2 % cream, Apply 1 Application topically 3 (three) times daily as needed for itching., Disp: 30 g, Rfl: 3   diphenhydrAMINE  (BENADRYL ) 25 mg capsule, Take 25 mg by mouth every 6 (six) hours as needed for itching or allergies., Disp: , Rfl:    doxycycline  (VIBRAMYCIN ) 50 MG capsule, Take 1 capsule (50 mg total) by mouth daily., Disp: 30 capsule, Rfl: 0   lisinopril (ZESTRIL) 10 MG tablet, Take 10 mg by mouth daily., Disp: , Rfl:    Omega-3 Fatty Acids (FISH OIL ADULT GUMMIES PO), Take by mouth., Disp: , Rfl:    predniSONE  (DELTASONE ) 10 MG tablet, Take 5 tablets (50 mg) by mouth once daily on Day 1. Then take 4 tablets (40 mg) on Day 2, then take 3 tablets (30 mg) on Day 3, then take 2 tablets (20 mg) on Day 4, then take 1 tablet (10 mg) on Day 5, then take 1/2 tablet (5 mg) on Day 6, then stop., Disp: 16 tablet, Rfl: 0   triamcinolone  ointment (KENALOG ) 0.5 %, Apply 1 Application topically 2 (two) times daily., Disp: 30 g, Rfl: 3   vitamin C (ASCORBIC ACID) 500 MG tablet, Take 500 mg by mouth daily., Disp: , Rfl:    aspirin  EC 81 MG tablet, Take 1 tablet (81 mg total) by mouth daily. Swallow whole. (Patient not taking: Reported on 11/07/2023), Disp: 30 tablet, Rfl: 0   ondansetron  (ZOFRAN ) 8 MG tablet, Take 1 tablet (8 mg total) by mouth every 8 (eight) hours as needed for nausea or vomiting. (Patient not taking: Reported on 11/07/2023), Disp: 30 tablet, Rfl: 1   potassium chloride  SA (KLOR-CON  M) 20 MEQ tablet,  Take 1 tablet (20 mEq total) by mouth daily. (Patient not taking: Reported on 11/07/2023), Disp: 21 tablet, Rfl: 0   prochlorperazine  (COMPAZINE ) 10 MG tablet, Take 1 tablet (10 mg total) by mouth every 6 (six) hours as needed for nausea or vomiting. (Patient not taking: Reported on 11/07/2023), Disp: 30 tablet, Rfl: 1   Tenofovir  Alafenamide Fumarate (VEMLIDY ) 25 MG TABS, Take 1 tablet (25 mg total)  by mouth daily. Take with food. (Patient not taking: Reported on 11/07/2023), Disp: 30 tablet, Rfl: 2 No current facility-administered medications for this visit.  Facility-Administered Medications Ordered in Other Visits:    0.9 %  sodium chloride  infusion, , Intravenous, Continuous, Brahmanday, Govinda R, MD, Stopped at 09/11/20 1502   heparin  lock flush 100 unit/mL, 500 Units, Intravenous, Once, [name], MD   heparin  lock flush 100 unit/mL, 500 Units, Intravenous, Once, [name], MD   methotrexate  (PF) 12 mg in sodium chloride  (PF) 0.9 % INTRATHECAL chemo injection, , Intrathecal, Once, [name], MD   sodium chloride  flush (NS) 0.9 % injection 10 mL, 10 mL, Intracatheter, PRN, [name], MD, 10 mL at 01/18/23 1545   sodium chloride  flush (NS) 0.9 % injection 10 mL, 10 mL, Intravenous, Once, [name], MD  Physical exam: There were no vitals filed for this visit. Physical Exam Cardiovascular:     Rate and Rhythm: Normal rate and regular rhythm.     Heart sounds: Normal heart sounds.  Pulmonary:     Effort: Pulmonary effort is normal.     Breath sounds: Normal breath sounds.  Skin:    General: Skin is warm and dry.  Neurological:     Mental Status: He is alert and oriented to person, place, and time.      I have personally reviewed labs listed below:    Latest Ref Rng & Units 11/28/2023    8:22 AM  CMP  Glucose 70 - 99 mg/dL 898   BUN 8 - 23 mg/dL 9   Creatinine 9.38 - 8.75 mg/dL 9.13   Sodium 864 - 854 mmol/L 139   Potassium 3.5 - 5.1 mmol/L 3.8   Chloride  98 - 111 mmol/L 105   CO2 22 - 32 mmol/L 26   Calcium 8.9 - 10.3 mg/dL 8.9   Total Protein 6.5 - 8.1 g/dL 6.4   Total Bilirubin 0.0 - 1.2 mg/dL 0.9   Alkaline Phos 38 - 126 U/L 142   AST 15 - 41 U/L 51   ALT 0 - 44 U/L 24       Latest Ref Rng & Units 11/07/2023    8:37 AM  CBC  WBC 4.0 - 10.5 K/uL 4.7   Hemoglobin 13.0 - 17.0 g/dL 87.5   Hematocrit 60.9 - 52.0 % 37.3   Platelets 150 - 400 K/uL 114      Assessment and plan- Patient is a 63 y.o. male with history of relapsed triple hit diffuse large B-cell lymphoma here for on treatment assessment prior to cycle 16 of Zynlonta   White count is normal and mild thrombocytopenia but otherwise okay to proceed with cycle 16 of Zynlonta  today with growth factor support.  I will see him back in 3 weeks for cycle 17.  Plan to repeat PET CT scan in 5 weeks time.  AST mildly elevated at 51 and he has had some mild chronic elevation of AST.  Continue to monitor.  Photophobia: Overall appears self-limited.  Zynlonta  was not known to cause any ocular toxicity.  If symptoms worsen I will refer him to ophthalmology.   Visit Diagnosis 1. Encounter for antineoplastic chemotherapy   2. High grade B-cell lymphoma (HCC)      Dr. Annah Melanee, MD, MPH [facility] at [facility] [phone] 11/28/2023 8:34 AM

## 2023-11-30 ENCOUNTER — Inpatient Hospital Stay

## 2023-11-30 DIAGNOSIS — Z5112 Encounter for antineoplastic immunotherapy: Secondary | ICD-10-CM | POA: Diagnosis not present

## 2023-11-30 DIAGNOSIS — C8510 Unspecified B-cell lymphoma, unspecified site: Secondary | ICD-10-CM

## 2023-11-30 MED ORDER — PEGFILGRASTIM-FPGK 6 MG/0.6ML ~~LOC~~ SOSY
6.0000 mg | PREFILLED_SYRINGE | Freq: Once | SUBCUTANEOUS | Status: AC
  Administered 2023-11-30: 6 mg via SUBCUTANEOUS
  Filled 2023-11-30: qty 0.6

## 2023-12-18 NOTE — Progress Notes (Deleted)
 Hematology/Oncology Consult note [facility]  Telephone:([phone] [phone]  Patient Care Team: Weyman Bright, MD as PCP - General (Internal Medicine) [name], MD as Consulting Physician (Oncology) [name], MD as Consulting Physician (General Surgery)   Name of the patient: Christopher Mills  [id number]  [birth year]   Date of visit: 12/18/23  Diagnosis- ***  Chief complaint/ Reason for visit- ***  Heme/Onc history: ***  Interval history- ***  ECOG PS- *** Pain scale- *** Opioid associated constipation- ***  Review of systems- ROS    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     Past Surgical History:  Procedure Laterality Date   BONE MARROW BIOPSY     COLONOSCOPY     EXCISION MASS NECK Right 06/12/2020   Procedure: EXCISION MASS NECK;  Surgeon: [name], MD;  Location: [facility] ORS;  Service: General;  Laterality: Right;   HERNIA REPAIR Right    at age 45-RIH   20 CATH INSERTION     PORTACATH PLACEMENT Right 06/24/2020   Procedure: INSERTION PORT-A-CATH;  Surgeon: [name], MD;  Location: [facility] ORS;  Service: General;  Laterality: Right;    Social History   Socioeconomic History   Marital status: Single    Spouse name: Not on file   Number of children: Not on file   Years of education: Not on file   Highest education level: Not on file  Occupational History   Not on file  Tobacco Use   Smoking status: Former    Current packs/day: 0.00    Types: Cigarettes    Quit date: 06/13/2020    Years since quitting: 3.5   Smokeless tobacco: Never  Vaping Use   Vaping status: Never Used  Substance and Sexual Activity   Alcohol use: Not Currently   Drug use: Not Currently    Types: Marijuana   Sexual activity: Not Currently  Other Topics Concern   Not on file  Social  History Narrative   Has daughter and grandson in the home   Social Drivers of Health   Financial Resource Strain: Medium Risk (07/30/2021)   Received from [facility]   Overall Financial Resource Strain (CARDIA)    Difficulty of Paying Living Expenses: Somewhat hard  Food Insecurity: No Food Insecurity (07/30/2021)   Received [facility]   Hunger Vital Sign    Within the past 12 months, you worried that your food would run out before you got the money to buy more.: Never true    Within the past 12 months, the food you bought just didn't last and you didn't have money to get more.: Never true  Transportation Needs: No Transportation Needs (07/30/2021)   Received from [facility]   PRAPARE - Transportation    Lack of Transportation (Medical): No    Lack of Transportation (Non-Medical): No  Physical Activity: Not on file  Stress: No Stress Concern Present (07/30/2021)   Received from [facility] of Occupational Health - Occupational Stress Questionnaire    Feeling of Stress : Not at all  Social Connections: Not on file  Intimate Partner Violence: Not on file    Family History  Problem Relation Age of Onset   Anemia Mother    Hypertension Mother  Goiter Mother    Cancer Father    Cancer Sister    Multiple sclerosis Sister      Current Outpatient Medications:    aspirin  EC 81 MG tablet, Take 1 tablet (81 mg total) by mouth daily. Swallow whole. (Patient not taking: Reported on 11/07/2023), Disp: 30 tablet, Rfl: 0   atenolol (TENORMIN) 25 MG tablet, Take 25 mg by mouth daily., Disp: , Rfl:    cholecalciferol (VITAMIN D3) 25 MCG (1000 UNIT) tablet, Take 1,000 Units by mouth daily., Disp: , Rfl:    diphenhydrAMINE  (BENADRYL ) 2 % cream, Apply 1 Application topically 3 (three) times daily as needed for itching., Disp: 30 g, Rfl: 3   diphenhydrAMINE  (BENADRYL ) 25 mg capsule, Take 25 mg by mouth every 6 (six) hours as needed for itching or  allergies., Disp: , Rfl:    doxycycline  (VIBRAMYCIN ) 50 MG capsule, Take 1 capsule (50 mg total) by mouth daily., Disp: 30 capsule, Rfl: 0   lisinopril (ZESTRIL) 10 MG tablet, Take 10 mg by mouth daily., Disp: , Rfl:    Omega-3 Fatty Acids (FISH OIL ADULT GUMMIES PO), Take by mouth., Disp: , Rfl:    ondansetron  (ZOFRAN ) 8 MG tablet, Take 1 tablet (8 mg total) by mouth every 8 (eight) hours as needed for nausea or vomiting. (Patient not taking: Reported on 11/07/2023), Disp: 30 tablet, Rfl: 1   potassium chloride  SA (KLOR-CON  M) 20 MEQ tablet, Take 1 tablet (20 mEq total) by mouth daily. (Patient not taking: Reported on 11/07/2023), Disp: 21 tablet, Rfl: 0   predniSONE  (DELTASONE ) 10 MG tablet, Take 5 tablets (50 mg) by mouth once daily on Day 1. Then take 4 tablets (40 mg) on Day 2, then take 3 tablets (30 mg) on Day 3, then take 2 tablets (20 mg) on Day 4, then take 1 tablet (10 mg) on Day 5, then take 1/2 tablet (5 mg) on Day 6, then stop., Disp: 16 tablet, Rfl: 0   prochlorperazine  (COMPAZINE ) 10 MG tablet, Take 1 tablet (10 mg total) by mouth every 6 (six) hours as needed for nausea or vomiting. (Patient not taking: Reported on 11/07/2023), Disp: 30 tablet, Rfl: 1   Tenofovir  Alafenamide Fumarate (VEMLIDY ) 25 MG TABS, Take 1 tablet (25 mg total) by mouth daily. Take with food. (Patient not taking: Reported on 11/07/2023), Disp: 30 tablet, Rfl: 2   triamcinolone  ointment (KENALOG ) 0.5 %, Apply 1 Application topically 2 (two) times daily., Disp: 30 g, Rfl: 3   vitamin C (ASCORBIC ACID) 500 MG tablet, Take 500 mg by mouth daily., Disp: , Rfl:  No current facility-administered medications for this visit.  Facility-Administered Medications Ordered in Other Visits:    0.9 %  sodium chloride  infusion, , Intravenous, Continuous, Brahmanday, Govinda R, MD, Stopped at 09/11/20 1502   heparin  lock flush 100 unit/mL, 500 Units, Intravenous, Once, [name], MD   heparin  lock flush 100 unit/mL, 500 Units,  Intravenous, Once, [name], MD   methotrexate  (PF) 12 mg in sodium chloride  (PF) 0.9 % INTRATHECAL chemo injection, , Intrathecal, Once, [name], MD   sodium chloride  flush (NS) 0.9 % injection 10 mL, 10 mL, Intracatheter, PRN, [name], MD, 10 mL at 01/18/23 1545   sodium chloride  flush (NS) 0.9 % injection 10 mL, 10 mL, Intravenous, Once, [name], MD  Physical exam: There were no vitals filed for this visit. Physical Exam   I have personally reviewed labs listed below:    Latest Ref  Rng & Units 11/28/2023    8:22 AM  CMP  Glucose 70 - 99 mg/dL 898   BUN 8 - 23 mg/dL 9   Creatinine 9.38 - 8.75 mg/dL 9.13   Sodium 864 - 854 mmol/L 139   Potassium 3.5 - 5.1 mmol/L 3.8   Chloride 98 - 111 mmol/L 105   CO2 22 - 32 mmol/L 26   Calcium 8.9 - 10.3 mg/dL 8.9   Total Protein 6.5 - 8.1 g/dL 6.4   Total Bilirubin 0.0 - 1.2 mg/dL 0.9   Alkaline Phos 38 - 126 U/L 142   AST 15 - 41 U/L 51   ALT 0 - 44 U/L 24       Latest Ref Rng & Units 11/28/2023    8:22 AM  CBC  WBC 4.0 - 10.5 K/uL 5.8   Hemoglobin 13.0 - 17.0 g/dL 87.2   Hematocrit 60.9 - 52.0 % 38.0   Platelets 150 - 400 K/uL 121    I have personally reviewed Radiology images listed below: No images are attached to the encounter.  No results found.   Assessment and plan- Patient is a 63 y.o. male ***   Visit Diagnosis No diagnosis found.   Dr. Annah Skene, MD, MPH [facility] at [facility] [phone] 12/18/2023 5:34 PM

## 2023-12-19 ENCOUNTER — Inpatient Hospital Stay: Attending: Oncology

## 2023-12-19 ENCOUNTER — Inpatient Hospital Stay: Attending: Oncology | Admitting: Oncology

## 2023-12-19 ENCOUNTER — Encounter: Payer: Self-pay | Admitting: Oncology

## 2023-12-19 ENCOUNTER — Inpatient Hospital Stay

## 2023-12-19 VITALS — BP 165/85 | HR 64 | Resp 16

## 2023-12-19 VITALS — BP 158/85 | HR 74 | Temp 97.8°F | Resp 18 | Ht 73.0 in | Wt 167.3 lb

## 2023-12-19 DIAGNOSIS — Z59868 Other specified financial insecurity: Secondary | ICD-10-CM | POA: Diagnosis not present

## 2023-12-19 DIAGNOSIS — Z809 Family history of malignant neoplasm, unspecified: Secondary | ICD-10-CM | POA: Diagnosis not present

## 2023-12-19 DIAGNOSIS — R7989 Other specified abnormal findings of blood chemistry: Secondary | ICD-10-CM

## 2023-12-19 DIAGNOSIS — Z5112 Encounter for antineoplastic immunotherapy: Secondary | ICD-10-CM | POA: Insufficient documentation

## 2023-12-19 DIAGNOSIS — Z8249 Family history of ischemic heart disease and other diseases of the circulatory system: Secondary | ICD-10-CM | POA: Diagnosis not present

## 2023-12-19 DIAGNOSIS — C8510 Unspecified B-cell lymphoma, unspecified site: Secondary | ICD-10-CM

## 2023-12-19 DIAGNOSIS — Z87891 Personal history of nicotine dependence: Secondary | ICD-10-CM | POA: Insufficient documentation

## 2023-12-19 DIAGNOSIS — R21 Rash and other nonspecific skin eruption: Secondary | ICD-10-CM | POA: Insufficient documentation

## 2023-12-19 DIAGNOSIS — D709 Neutropenia, unspecified: Secondary | ICD-10-CM | POA: Diagnosis not present

## 2023-12-19 DIAGNOSIS — Z8349 Family history of other endocrine, nutritional and metabolic diseases: Secondary | ICD-10-CM | POA: Insufficient documentation

## 2023-12-19 DIAGNOSIS — R6889 Other general symptoms and signs: Secondary | ICD-10-CM | POA: Insufficient documentation

## 2023-12-19 DIAGNOSIS — Z79899 Other long term (current) drug therapy: Secondary | ICD-10-CM | POA: Insufficient documentation

## 2023-12-19 DIAGNOSIS — H53149 Visual discomfort, unspecified: Secondary | ICD-10-CM | POA: Diagnosis not present

## 2023-12-19 DIAGNOSIS — Z7982 Long term (current) use of aspirin: Secondary | ICD-10-CM | POA: Insufficient documentation

## 2023-12-19 DIAGNOSIS — C8338 Diffuse large B-cell lymphoma, lymph nodes of multiple sites: Secondary | ICD-10-CM | POA: Diagnosis present

## 2023-12-19 DIAGNOSIS — Z5111 Encounter for antineoplastic chemotherapy: Secondary | ICD-10-CM | POA: Diagnosis not present

## 2023-12-19 DIAGNOSIS — I10 Essential (primary) hypertension: Secondary | ICD-10-CM | POA: Insufficient documentation

## 2023-12-19 DIAGNOSIS — Z820 Family history of epilepsy and other diseases of the nervous system: Secondary | ICD-10-CM | POA: Insufficient documentation

## 2023-12-19 LAB — CMP (CANCER CENTER ONLY)
ALT: 22 U/L (ref 0–44)
AST: 47 U/L — ABNORMAL HIGH (ref 15–41)
Albumin: 3.7 g/dL (ref 3.5–5.0)
Alkaline Phosphatase: 132 U/L — ABNORMAL HIGH (ref 38–126)
Anion gap: 8 (ref 5–15)
BUN: 9 mg/dL (ref 8–23)
CO2: 26 mmol/L (ref 22–32)
Calcium: 9.3 mg/dL (ref 8.9–10.3)
Chloride: 108 mmol/L (ref 98–111)
Creatinine: 0.75 mg/dL (ref 0.61–1.24)
GFR, Estimated: >60 mL/min (ref >60)
Glucose, Bld: 102 mg/dL — ABNORMAL HIGH (ref 70–99)
Potassium: 3.7 mmol/L (ref 3.5–5.1)
Sodium: 142 mmol/L (ref 135–145)
Total Bilirubin: 0.6 mg/dL (ref 0.0–1.2)
Total Protein: 6.6 g/dL (ref 6.5–8.1)

## 2023-12-19 LAB — CBC WITH DIFFERENTIAL (CANCER CENTER ONLY)
Abs Immature Granulocytes: 0.15 K/uL — ABNORMAL HIGH (ref 0.00–0.07)
Basophils Absolute: 0.1 K/uL (ref 0.0–0.1)
Basophils Relative: 1 %
Eosinophils Absolute: 0 K/uL (ref 0.0–0.5)
Eosinophils Relative: 0 %
HCT: 36.3 % — ABNORMAL LOW (ref 39.0–52.0)
Hemoglobin: 12.4 g/dL — ABNORMAL LOW (ref 13.0–17.0)
Immature Granulocytes: 3 %
Lymphocytes Relative: 31 %
Lymphs Abs: 1.9 K/uL (ref 0.7–4.0)
MCH: 32.5 pg (ref 26.0–34.0)
MCHC: 34.2 g/dL (ref 30.0–36.0)
MCV: 95.3 fL (ref 80.0–100.0)
Monocytes Absolute: 0.5 K/uL (ref 0.1–1.0)
Monocytes Relative: 9 %
Neutro Abs: 3.4 K/uL (ref 1.7–7.7)
Neutrophils Relative %: 56 %
Platelet Count: 138 K/uL — ABNORMAL LOW (ref 150–400)
RBC: 3.81 MIL/uL — ABNORMAL LOW (ref 4.22–5.81)
RDW: 15.8 % — ABNORMAL HIGH (ref 11.5–15.5)
WBC Count: 6.1 K/uL (ref 4.0–10.5)
nRBC: 0 % (ref 0.0–0.2)

## 2023-12-19 MED ORDER — DEXTROSE 5 % IV SOLN
Freq: Once | INTRAVENOUS | Status: AC
  Filled 2023-12-19: qty 250

## 2023-12-19 MED ORDER — LONCASTUXIMAB TESIRINE-LPYL CHEMO INJECTION 10 MG
0.0750 mg/kg | Freq: Once | INTRAVENOUS | Status: AC
  Administered 2023-12-19: 6.1 mg via INTRAVENOUS
  Filled 2023-12-19: qty 1.22

## 2023-12-19 MED ORDER — DEXAMETHASONE SODIUM PHOSPHATE 10 MG/ML IJ SOLN
10.0000 mg | Freq: Once | INTRAMUSCULAR | Status: AC
  Administered 2023-12-19: 10 mg via INTRAVENOUS
  Filled 2023-12-19: qty 1

## 2023-12-19 NOTE — Progress Notes (Signed)
 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  This was followed by a PET CT scan which showed extensive FDG avid adenopathy in the neck chest abdomen and pelvis.  FDG avid splenic lesions.  Solitary intramuscular FDG avid lesion in the right biceps femoris muscle.   Supraclavicular excisional lymph node biopsy showed high-grade B-cell lymphoma germinal center type Ki-67 greater than 95%.  FISH testing was positive for Bcl-2 BCL6 and MYC consistent with triple hit lymphoma.  By NCCN IPI score would be 4 based on age and elevated LDH and stage IV (intramuscular biceps femoris lesion) which puts him in the high intermediate risk group. 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   Patient received RCHOP for cycle 1 with plans for [facility] with cycle 2. IT MTX for CNS prophylaxis and he has received 3 cycles but declined further cycles.  MRI brain negative for lymphoma.  Chemotherapy was not being able to escalated upwards due to neutropenia which has persisted to 3 weeks and he has been receiving treatment every 4 weeks   PET CT  scan after 3 cycles of chemotherapy showedComplete or near complete metabolic response.  Residual disease in the anterior upper right mediastinum slightly greater than background mediastinal activity   Patient found to have a clinically palpable right supraclavicular lymph node which was subsequently biopsied and was consistent with previously diagnosed lymphoma.  PET CT scan showedNew right level 5 adenopathy in the lower neck with dominant lymph node 1.6 cm Deauville 5.  No other findings of acute active lymphoma in chest abdomen pelvis and bones.  MRI brain negative for leptomeningeal disease.   Patient seen for second opinion by Dr. Gaylan at [facility] for consideration of CAR-T cell therapy.  Patient did not wish to proceed with CAR-T cell treatment after reviewing risks versus benefits.  Moreover patient's health insurance will also not cover CAR-T cell therapy. Patient started on second line tafasitamab /Revlimid  regimen in June 2023.  He is on Revlimid  15 mg 3 weeks on and 1 week off.  Disease progression after 3 cycles and patient was switched to [facility] regimen     Patient was noted to haveSignificant decrease in the size of supraclavicular lymph nodes consistent with treatment response Deauville 3 after 3 cycles of Pola BR chemotherapy.  Patient has only received 4 cycles of chemotherapy so far due to persistent neutropenia despite receiving growth factor support.  Repeat PET scan showed new/recurrent hypermetabolic subcutaneous tissue in the high right back/inferior neck consistent with recurrent lymphoma Deauville 5.  He received palliative radiation to that area.  Patient was started on Zynlonta  in June 2024 after he was found to have recurrent disease involving subcutaneous right  scapular region.  Patient desired treatment break and therefore treatment was held again in November 2024 when PET scan showed complete resolution of disease.  Treatment was restarted in March 2025 when he had recurrent  disease noted on PET scan    Interval history-overall he is tolerating treatments well.  He continues to have occasional episodes of photophobia which are random.  Denies any decrease in his vision.  He does use steroids before and after his chemotherapy for 2 days and that is controlling his rash which mainly flares up when he goes out in the sun.  ECOG PS- 0 Pain scale- 0   Review of systems- Review of Systems  Constitutional:  Negative for chills, fever, malaise/fatigue and weight loss.  HENT:  Negative for congestion, ear discharge and nosebleeds.   Eyes:  Negative for blurred vision.  Respiratory:  Negative for cough, hemoptysis, sputum production, shortness of breath and wheezing.   Cardiovascular:  Negative for chest pain, palpitations, orthopnea and claudication.  Gastrointestinal:  Negative for abdominal pain, blood in stool, constipation, diarrhea, heartburn, melena, nausea and vomiting.  Genitourinary:  Negative for dysuria, flank pain, frequency, hematuria and urgency.  Musculoskeletal:  Negative for back pain, joint pain and myalgias.  Skin:  Negative for rash.  Neurological:  Negative for dizziness, tingling, focal weakness, seizures, weakness and headaches.       Occasional photophobia  Endo/Heme/Allergies:  Does not bruise/bleed easily.  Psychiatric/Behavioral:  Negative for depression and suicidal ideas. The patient does not have insomnia.       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     Past Surgical History:  Procedure Laterality Date   BONE MARROW BIOPSY     COLONOSCOPY     EXCISION MASS NECK Right 06/12/2020   Procedure: EXCISION MASS NECK;  Surgeon: [name], MD;  Location: [facility] ORS;  Service: General;  Laterality: Right;   HERNIA REPAIR Right    at age 34-RIH   14 CATH INSERTION     PORTACATH PLACEMENT Right  06/24/2020   Procedure: INSERTION PORT-A-CATH;  Surgeon: [name], MD;  Location: [facility] ORS;  Service: General;  Laterality: Right;    Social History   Socioeconomic History   Marital status: Single    Spouse name: Not on file   Number of children: Not on file   Years of education: Not on file   Highest education level: Not on file  Occupational History   Not on file  Tobacco Use   Smoking status: Former    Current packs/day: 0.00    Types: Cigarettes    Quit date: 06/13/2020    Years since quitting: 3.5   Smokeless tobacco: Never  Vaping Use   Vaping status: Never Used  Substance and Sexual Activity   Alcohol use: Not Currently   Drug use: Not Currently    Types: Marijuana   Sexual activity: Not Currently  Other Topics Concern   Not on file  Social History Narrative   Has daughter and grandson in the home   Social Drivers of Health   Financial Resource Strain: Medium Risk (07/30/2021)   Received from [facility]   Overall Financial Resource Strain (CARDIA)    Difficulty of Paying Living Expenses: Somewhat hard  Food Insecurity: No Food Insecurity (07/30/2021)   Received from [facility]  Hunger Vital Sign    Within the past 12 months, you worried that your food would run out before you got the money to buy more.: Never true    Within the past 12 months, the food you bought just didn't last and you didn't have money to get more.: Never true  Transportation Needs: No Transportation Needs (07/30/2021)   Received from [facility]   PRAPARE - Transportation    Lack of Transportation (Medical): No    Lack of Transportation (Non-Medical): No  Physical Activity: Not on file  Stress: No Stress Concern Present (07/30/2021)   Received from [facility] - Occupational Stress Questionnaire    Feeling of Stress : Not at all  Social Connections: Not on file  Intimate Partner Violence: Not on file    Family History   Problem Relation Age of Onset   Anemia Mother    Hypertension Mother    Goiter Mother    Cancer Father    Cancer Sister    Multiple sclerosis Sister      Current Outpatient Medications:    atenolol (TENORMIN) 25 MG tablet, Take 25 mg by mouth daily., Disp: , Rfl:    cholecalciferol (VITAMIN D3) 25 MCG (1000 UNIT) tablet, Take 1,000 Units by mouth daily., Disp: , Rfl:    diphenhydrAMINE  (BENADRYL ) 2 % cream, Apply 1 Application topically 3 (three) times daily as needed for itching., Disp: 30 g, Rfl: 3   diphenhydrAMINE  (BENADRYL ) 25 mg capsule, Take 25 mg by mouth every 6 (six) hours as needed for itching or allergies., Disp: , Rfl:    doxycycline  (VIBRAMYCIN ) 50 MG capsule, Take 1 capsule (50 mg total) by mouth daily., Disp: 30 capsule, Rfl: 0   lisinopril (ZESTRIL) 10 MG tablet, Take 10 mg by mouth daily., Disp: , Rfl:    Omega-3 Fatty Acids (FISH OIL ADULT GUMMIES PO), Take by mouth., Disp: , Rfl:    triamcinolone  ointment (KENALOG ) 0.5 %, Apply 1 Application topically 2 (two) times daily., Disp: 30 g, Rfl: 3   vitamin C (ASCORBIC ACID) 500 MG tablet, Take 500 mg by mouth daily., Disp: , Rfl:    aspirin  EC 81 MG tablet, Take 1 tablet (81 mg total) by mouth daily. Swallow whole. (Patient not taking: Reported on 11/07/2023), Disp: 30 tablet, Rfl: 0   ondansetron  (ZOFRAN ) 8 MG tablet, Take 1 tablet (8 mg total) by mouth every 8 (eight) hours as needed for nausea or vomiting. (Patient not taking: Reported on 11/07/2023), Disp: 30 tablet, Rfl: 1   potassium chloride  SA (KLOR-CON  M) 20 MEQ tablet, Take 1 tablet (20 mEq total) by mouth daily. (Patient not taking: Reported on 11/07/2023), Disp: 21 tablet, Rfl: 0   predniSONE  (DELTASONE ) 10 MG tablet, Take 5 tablets (50 mg) by mouth once daily on Day 1. Then take 4 tablets (40 mg) on Day 2, then take 3 tablets (30 mg) on Day 3, then take 2 tablets (20 mg) on Day 4, then take 1 tablet (10 mg) on Day 5, then take 1/2 tablet (5 mg) on Day 6, then stop.,  Disp: 16 tablet, Rfl: 0   prochlorperazine  (COMPAZINE ) 10 MG tablet, Take 1 tablet (10 mg total) by mouth every 6 (six) hours as needed for nausea or vomiting. (Patient not taking: Reported on 11/07/2023), Disp: 30 tablet, Rfl: 1   Tenofovir  Alafenamide Fumarate (VEMLIDY ) 25 MG TABS, Take 1 tablet (25 mg total) by mouth daily. Take with food. (Patient  not taking: Reported on 11/07/2023), Disp: 30 tablet, Rfl: 2 No current facility-administered medications for this visit.  Facility-Administered Medications Ordered in Other Visits:    0.9 %  sodium chloride  infusion, , Intravenous, Continuous, Brahmanday, Govinda R, MD, Stopped at 09/11/20 1502   heparin  lock flush 100 unit/mL, 500 Units, Intravenous, Once, [name], MD   heparin  lock flush 100 unit/mL, 500 Units, Intravenous, Once, [name], MD   methotrexate  (PF) 12 mg in sodium chloride  (PF) 0.9 % INTRATHECAL chemo injection, , Intrathecal, Once, [name], MD   sodium chloride  flush (NS) 0.9 % injection 10 mL, 10 mL, Intracatheter, PRN, [name], MD, 10 mL at 01/18/23 1545   sodium chloride  flush (NS) 0.9 % injection 10 mL, 10 mL, Intravenous, Once, [name], MD  Physical exam:  Vitals:   12/19/23 0830  BP: (!) 158/85  Pulse: 74  Resp: 18  Temp: 97.8 F (36.6 C)  TempSrc: Tympanic  SpO2: 100%  Weight: 167 lb 4.8 oz (75.9 kg)  Height: 6' 1 (1.854 m)   Physical Exam Cardiovascular:     Rate and Rhythm: Normal rate and regular rhythm.     Heart sounds: Normal heart sounds.  Pulmonary:     Effort: Pulmonary effort is normal.     Breath sounds: Normal breath sounds.  Abdominal:     General: Bowel sounds are normal.     Palpations: Abdomen is soft.  Skin:    General: Skin is warm and dry.  Neurological:     Mental Status: He is alert and oriented to person, place, and time.      I have personally reviewed labs listed below:    Latest Ref Rng & Units 11/28/2023    8:22 AM  CMP  Glucose 70 - 99  mg/dL 898   BUN 8 - 23 mg/dL 9   Creatinine 9.38 - 8.75 mg/dL 9.13   Sodium 864 - 854 mmol/L 139   Potassium 3.5 - 5.1 mmol/L 3.8   Chloride 98 - 111 mmol/L 105   CO2 22 - 32 mmol/L 26   Calcium 8.9 - 10.3 mg/dL 8.9   Total Protein 6.5 - 8.1 g/dL 6.4   Total Bilirubin 0.0 - 1.2 mg/dL 0.9   Alkaline Phos 38 - 126 U/L 142   AST 15 - 41 U/L 51   ALT 0 - 44 U/L 24       Latest Ref Rng & Units 11/28/2023    8:22 AM  CBC  WBC 4.0 - 10.5 K/uL 5.8   Hemoglobin 13.0 - 17.0 g/dL 87.2   Hematocrit 60.9 - 52.0 % 38.0   Platelets 150 - 400 K/uL 121       Assessment and plan- Patient is a 63 y.o. male with history of relapsed triple hit diffuse large B cell Lymphoma here for on treatment assessment prior to next cycle of Zynlonta   Counts okay to proceed with cycle 17 of Zynlonta  today and I will see him back in 3 weeks for cycle 18.  Repeat PET scan is already scheduled for 01/02/2024.  Photophobia: Zynlonta  does not cause any known ocular toxicity.  I am getting MRI brain with and without contrast at this time and also refer him to [facility]  Abnormal LFTs: AST mildly elevated at 47 and has been stable between 40s to 50s over the last 4 months.  Continue to monitor   Visit Diagnosis 1. High grade B-cell lymphoma (HCC)  2. Encounter for antineoplastic chemotherapy      Dr. Annah Skene, MD, MPH [facility] at [facility] [phone] 12/19/2023 8:48 AM

## 2023-12-20 ENCOUNTER — Ambulatory Visit

## 2023-12-20 ENCOUNTER — Encounter: Payer: Self-pay | Admitting: Oncology

## 2023-12-21 ENCOUNTER — Inpatient Hospital Stay

## 2023-12-21 DIAGNOSIS — Z5112 Encounter for antineoplastic immunotherapy: Secondary | ICD-10-CM | POA: Diagnosis not present

## 2023-12-21 DIAGNOSIS — C8510 Unspecified B-cell lymphoma, unspecified site: Secondary | ICD-10-CM

## 2023-12-21 MED ORDER — PEGFILGRASTIM-FPGK 6 MG/0.6ML ~~LOC~~ SOSY
6.0000 mg | PREFILLED_SYRINGE | Freq: Once | SUBCUTANEOUS | Status: AC
  Administered 2023-12-21: 6 mg via SUBCUTANEOUS
  Filled 2023-12-21: qty 0.6

## 2023-12-23 ENCOUNTER — Other Ambulatory Visit: Payer: Medicaid Other

## 2023-12-28 ENCOUNTER — Telehealth: Payer: Self-pay

## 2023-12-28 DIAGNOSIS — I159 Secondary hypertension, unspecified: Secondary | ICD-10-CM

## 2023-12-28 NOTE — Telephone Encounter (Signed)
 Voicemail received from patient today 12/28/23 at 3:36PM requesting a call back; call came from phone number [phone].  Outbound call; patient had a question. Says his primary doctor retired and he was referred to another doctor and he won't see his new PCP for another 2-3 weeks.  Patient asked if Dr. Melanee can refill his blood pressure medications: atenolol 25mg  daily and lisinopril 10mg  daily to hold him over. If approved patient requested scripts be sent to [facility] pharmacy since he has a MRI brain w/ and w/o contrast scheduled tomorrow 10/16 at 9am.  Informed I would forward the message to Dr. Melanee and someone should be following up with him shortly.  Patient verbalized understanding.

## 2023-12-29 ENCOUNTER — Other Ambulatory Visit: Payer: Self-pay

## 2023-12-29 ENCOUNTER — Ambulatory Visit
Admission: RE | Admit: 2023-12-29 | Discharge: 2023-12-29 | Disposition: A | Discharge status: Home / Self Care | Source: Ambulatory Visit | Attending: Oncology | Admitting: Oncology

## 2023-12-29 ENCOUNTER — Encounter: Payer: Self-pay | Admitting: Oncology

## 2023-12-29 DIAGNOSIS — C8510 Unspecified B-cell lymphoma, unspecified site: Secondary | ICD-10-CM | POA: Insufficient documentation

## 2023-12-29 MED ORDER — LISINOPRIL 10 MG PO TABS
10.0000 mg | ORAL_TABLET | Freq: Every day | ORAL | 0 refills | Status: DC
  Filled 2023-12-29: qty 30, 30d supply, fill #0

## 2023-12-29 MED ORDER — GADOBUTROL 1 MMOL/ML IV SOLN
7.5000 mL | Freq: Once | INTRAVENOUS | Status: AC | PRN
  Administered 2023-12-29: 7.5 mL via INTRAVENOUS

## 2023-12-29 MED ORDER — ATENOLOL 25 MG PO TABS
25.0000 mg | ORAL_TABLET | Freq: Every day | ORAL | 0 refills | Status: DC
  Filled 2023-12-29: qty 30, 30d supply, fill #0

## 2023-12-29 NOTE — Telephone Encounter (Signed)
 Bp meds - Rf sent to pharmacy

## 2023-12-29 NOTE — Telephone Encounter (Signed)
 Okay to refill his blood pressure medications

## 2024-01-02 ENCOUNTER — Ambulatory Visit
Admission: RE | Admit: 2024-01-02 | Discharge: 2024-01-02 | Disposition: A | Discharge status: Home / Self Care | Source: Ambulatory Visit | Attending: Oncology | Admitting: Oncology

## 2024-01-02 DIAGNOSIS — C8510 Unspecified B-cell lymphoma, unspecified site: Secondary | ICD-10-CM | POA: Insufficient documentation

## 2024-01-02 LAB — GLUCOSE, CAPILLARY: Glucose-Capillary: 82 mg/dL (ref 70–99)

## 2024-01-02 MED ORDER — FLUDEOXYGLUCOSE F - 18 (FDG) INJECTION: 9.4000 | Freq: Once | INTRAVENOUS | Status: DC | PRN

## 2024-01-03 ENCOUNTER — Encounter: Payer: Self-pay | Admitting: Cardiology

## 2024-01-03 ENCOUNTER — Ambulatory Visit (INDEPENDENT_AMBULATORY_CARE_PROVIDER_SITE_OTHER): Payer: Self-pay | Admitting: Cardiology

## 2024-01-03 ENCOUNTER — Encounter: Payer: Self-pay | Admitting: Oncology

## 2024-01-03 VITALS — BP 124/80 | HR 94 | Ht 73.0 in | Wt 168.6 lb

## 2024-01-03 DIAGNOSIS — Z7689 Persons encountering health services in other specified circumstances: Secondary | ICD-10-CM | POA: Diagnosis not present

## 2024-01-03 DIAGNOSIS — I10 Essential (primary) hypertension: Secondary | ICD-10-CM | POA: Diagnosis not present

## 2024-01-03 DIAGNOSIS — Z13220 Encounter for screening for lipoid disorders: Secondary | ICD-10-CM

## 2024-01-03 DIAGNOSIS — I159 Secondary hypertension, unspecified: Secondary | ICD-10-CM | POA: Diagnosis not present

## 2024-01-03 DIAGNOSIS — Z1329 Encounter for screening for other suspected endocrine disorder: Secondary | ICD-10-CM

## 2024-01-03 DIAGNOSIS — Z131 Encounter for screening for diabetes mellitus: Secondary | ICD-10-CM

## 2024-01-03 MED ORDER — LISINOPRIL 10 MG PO TABS: 10.0000 mg | ORAL_TABLET | Freq: Every day | ORAL | 1 refills | Status: DC

## 2024-01-03 MED ORDER — ATENOLOL 25 MG PO TABS: 25.0000 mg | ORAL_TABLET | Freq: Every day | ORAL | 1 refills | Status: DC

## 2024-01-03 NOTE — Progress Notes (Signed)
 New Patient Office Visit  Subjective   Patient ID: Christopher Mills, male    DOB: 19-Oct-1960  Age: 63 y.o. MRN: [id number]  CC:  Chief Complaint  Patient presents with   Establish Care    NPE    HPI Christopher Mills presents to establish care Previous Primary Care provider/office:   he does not have additional concerns to discuss today.   Patient in office to establish care. 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 Patient reports feeling well, no complaints today.  Patient not fasting, will return for fasting lab work.  Over due for colonoscopy, will discuss at next visit. Patient states he has put it off since cancer diagnosis.  Blood pressure well controlled, continue current medications.     Outpatient Encounter Medications as of 01/03/2024  Medication Sig   cholecalciferol (VITAMIN D3) 25 MCG (1000 UNIT) tablet Take 1,000 Units by mouth daily.   diphenhydrAMINE  (BENADRYL ) 2 % cream Apply 1 Application topically 3 (three) times daily as needed for itching.   diphenhydrAMINE  (BENADRYL ) 25 mg capsule Take 25 mg by mouth every 6 (six) hours as needed for itching or allergies.   Omega-3 Fatty Acids (FISH OIL ADULT GUMMIES PO) Take by mouth.   predniSONE  (DELTASONE ) 10 MG tablet Take 5 tablets (50 mg) by mouth once daily on Day 1. Then take 4 tablets (40 mg) on Day 2, then take 3 tablets (30 mg) on Day 3, then take 2 tablets (20 mg) on Day 4, then take 1 tablet (10 mg) on Day 5, then take 1/2 tablet (5 mg) on Day 6, then stop.   vitamin C (ASCORBIC ACID) 500 MG tablet Take 500 mg by mouth daily.   [DISCONTINUED] atenolol (TENORMIN) 25 MG tablet Take 1 tablet (25 mg total) by mouth daily.   [DISCONTINUED] lisinopril (ZESTRIL) 10 MG tablet Take 1 tablet (10 mg total) by mouth daily.   aspirin  EC 81 MG tablet Take 1 tablet (81 mg total) by mouth daily. Swallow whole. (Patient not taking: Reported on 01/03/2024)    atenolol (TENORMIN) 25 MG tablet Take 1 tablet (25 mg total) by mouth daily.   lisinopril (ZESTRIL) 10 MG tablet Take 1 tablet (10 mg total) by mouth daily.   Tenofovir  Alafenamide Fumarate (VEMLIDY ) 25 MG TABS Take 1 tablet (25 mg total) by mouth daily. Take with food. (Patient not taking: Reported on 01/03/2024)   triamcinolone  ointment (KENALOG ) 0.5 % Apply 1 Application topically 2 (two) times daily. (Patient not taking: Reported on 01/03/2024)   [DISCONTINUED] doxycycline  (VIBRAMYCIN ) 50 MG capsule Take 1 capsule (50 mg total) by mouth daily. (Patient not taking: Reported on 01/03/2024)   [DISCONTINUED] ondansetron  (ZOFRAN ) 8 MG tablet Take 1 tablet (8 mg total) by mouth every 8 (eight) hours as needed for nausea or vomiting. (Patient not taking: Reported on 01/03/2024)   [DISCONTINUED] potassium chloride  SA (KLOR-CON  M) 20 MEQ tablet Take 1 tablet (20 mEq total) by mouth daily. (Patient not taking: Reported on 01/03/2024)   [DISCONTINUED] prochlorperazine  (COMPAZINE ) 10 MG tablet Take 1 tablet (10 mg total) by mouth every 6 (six) hours as needed for nausea or vomiting. 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    Past Surgical History:  Procedure Laterality Date   BONE MARROW BIOPSY     COLONOSCOPY     EXCISION MASS NECK Right 06/12/2020   Procedure: EXCISION MASS NECK;  Surgeon: [name], MD;  Location: [facility]  ORS;  Service: General;  Laterality: Right;   HERNIA REPAIR Right    at age 46-RIH   4 CATH INSERTION     PORTACATH PLACEMENT Right 06/24/2020   Procedure: INSERTION PORT-A-CATH;  Surgeon: [name], MD;  Location: [facility] ORS;  Service: General;  Laterality: Right;    Family History  Problem Relation Age of Onset   Anemia Mother    Hypertension Mother    Goiter Mother    Cancer Father    Cancer Sister    Multiple sclerosis Sister     Social History   Socioeconomic History   Marital status: Single    Spouse name: Not on file   Number of children: Not on file   Years of education: Not on file   Highest education level: Not on file  Occupational History   Not on file  Tobacco Use   Smoking status: Former    Current packs/day: 0.00    Types: Cigarettes    Quit date: 06/13/2020    Years since quitting: 3.5   Smokeless tobacco: Never  Vaping Use   Vaping status: Never Used  Substance and Sexual Activity   Alcohol use: Not Currently   Drug use: Not Currently    Types: Marijuana   Sexual activity: Not Currently  Other Topics Concern   Not on file  Social History Narrative   Has daughter and grandson in the home   Social Drivers of Health   Financial Resource Strain: Medium Risk (07/30/2021)   Received [facility]   Overall Financial Resource Strain (CARDIA)    Difficulty of Paying Living Expenses: Somewhat hard  Food Insecurity: No Food Insecurity (07/30/2021)   Received from [facility]   Hunger Vital Sign    Within the past 12 months, you worried that your food would run out before you got the money to buy more.: Never true    Within the past 12 months, the food you bought just didn't last and you didn't have money to get more.: Never true  Transportation Needs: No Transportation Needs (07/30/2021)   Received from [facility]   PRAPARE - Transportation    Lack of Transportation (Medical): No    Lack of Transportation (Non-Medical): No   Physical Activity: Not on file  Stress: No Stress Concern Present (07/30/2021)   Received from [facility] - Occupational Stress Questionnaire    Feeling of Stress : Not at all  Social Connections: Not on file  Intimate Partner Violence: Not on file    Review of Systems  Constitutional: Negative.   HENT: Negative.    Eyes: Negative.   Respiratory: Negative.  Negative for shortness of breath.   Cardiovascular: Negative.  Negative for chest pain.  Gastrointestinal: Negative.  Negative for abdominal pain, constipation and diarrhea.  Genitourinary: Negative.   Musculoskeletal:  Negative for joint pain and myalgias.  Skin: Negative.   Neurological: Negative.  Negative for dizziness and headaches.  Endo/Heme/Allergies: Negative.   All other systems reviewed and are negative.       Objective   BP 124/80   Pulse 94   Ht 6' 1 (1.854 m)   Wt 168 lb 9.6 oz (76.5 kg)   SpO2 99%   BMI 22.24 kg/m   Physical Exam Nursing note reviewed.  Constitutional:      Appearance: Normal appearance. He is normal weight.  HENT:     Head: Normocephalic and atraumatic.     Nose: Nose normal.     Mouth/Throat:     Mouth: Mucous membranes are moist.     Pharynx: Oropharynx is clear.  Eyes:     Extraocular Movements: Extraocular movements intact.     Conjunctiva/sclera: Conjunctivae normal.     Pupils: Pupils are equal, round, and reactive to light.  Cardiovascular:     Rate and Rhythm: Normal rate and regular rhythm.     Pulses: Normal pulses.     Heart sounds: Normal heart sounds.  Pulmonary:     Effort: Pulmonary effort is normal.     Breath sounds: Normal breath sounds.  Abdominal:     General: Abdomen is flat. Bowel sounds are normal.     Palpations: Abdomen is soft.  Musculoskeletal:        General: Normal range of motion.     Cervical back: Normal range of motion.  Skin:    General: Skin is warm and dry.  Neurological:      General: No focal deficit present.     Mental Status: He is alert and oriented to person, place, and time.  Psychiatric:        Mood and Affect: Mood normal.        Behavior: Behavior normal.        Thought Content: Thought content normal.        Judgment: Judgment normal.        Assessment & Plan:  Return for fasting lab work. Continue current medications.   Problem List Items Addressed This Visit       Cardiovascular and Mediastinum   Hypertension - Primary   Relevant Medications   atenolol (TENORMIN) 25 MG tablet   lisinopril (ZESTRIL) 10 MG tablet   Other Relevant Orders   CMP14+EGFR     Other   Encounter to establish care   Other Visit Diagnoses       Thyroid  disorder screening       Relevant Orders   TSH     Diabetes mellitus screening       Relevant Orders   Hemoglobin A1c     Lipid screening       Relevant Orders   Lipid Profile       Return in about 4 months (around 05/05/2024).   Total time spent: 25 minutes. This time includes review of previous notes and results and patient face to face interaction during today's visit.    Jeoffrey Pollen, NP  01/03/2024   This document may have been prepared by Dragon Voice Recognition software and as such may include unintentional dictation errors.

## 2024-01-04 ENCOUNTER — Other Ambulatory Visit: Payer: Self-pay

## 2024-01-05 ENCOUNTER — Other Ambulatory Visit: Payer: Self-pay

## 2024-01-05 ENCOUNTER — Telehealth: Payer: Self-pay

## 2024-01-05 DIAGNOSIS — Z1211 Encounter for screening for malignant neoplasm of colon: Secondary | ICD-10-CM

## 2024-01-05 MED ORDER — NA SULFATE-K SULFATE-MG SULF 17.5-3.13-1.6 GM/177ML PO SOLN: 1.0000 | Freq: Once | ORAL | 0 refills | Status: AC

## 2024-01-05 NOTE — Telephone Encounter (Signed)
 Gastroenterology Pre-Procedure Review  Request Date: 03/30/24 Requesting Physician: Dr. Melany  PATIENT REVIEW QUESTIONS: The patient responded to the following health history questions as indicated:    1. Are you having any GI issues? no 2. Do you have a personal history of Polyps? no 3. Do you have a family history of Colon Cancer or Polyps? no 4. Diabetes Mellitus? no 5. Joint replacements in the past 12 months?no 6. Major health problems in the past 3 months?no 7. Any artificial heart valves, MVP, or defibrillator?no    MEDICATIONS & ALLERGIES:    Patient reports the following regarding taking any anticoagulation/antiplatelet therapy:   Plavix, Coumadin, Eliquis, Xarelto, Lovenox, Pradaxa, Brilinta, or Effient? no Aspirin ? no  Patient confirms/reports the following medications:  Current Outpatient Medications  Medication Sig Dispense Refill   aspirin  EC 81 MG tablet Take 1 tablet (81 mg total) by mouth daily. Swallow whole. (Patient not taking: Reported on 01/03/2024) 30 tablet 0   atenolol (TENORMIN) 25 MG tablet Take 1 tablet (25 mg total) by mouth daily. 90 tablet 1   cholecalciferol (VITAMIN D3) 25 MCG (1000 UNIT) tablet Take 1,000 Units by mouth daily.     diphenhydrAMINE  (BENADRYL ) 2 % cream Apply 1 Application topically 3 (three) times daily as needed for itching. 30 g 3   diphenhydrAMINE  (BENADRYL ) 25 mg capsule Take 25 mg by mouth every 6 (six) hours as needed for itching or allergies.     lisinopril (ZESTRIL) 10 MG tablet Take 1 tablet (10 mg total) by mouth daily. 90 tablet 1   Omega-3 Fatty Acids (FISH OIL ADULT GUMMIES PO) Take by mouth.     predniSONE  (DELTASONE ) 10 MG tablet Take 5 tablets (50 mg) by mouth once daily on Day 1. Then take 4 tablets (40 mg) on Day 2, then take 3 tablets (30 mg) on Day 3, then take 2 tablets (20 mg) on Day 4, then take 1 tablet (10 mg) on Day 5, then take 1/2 tablet (5 mg) on Day 6, then stop. 16 tablet 0   Tenofovir  Alafenamide Fumarate  (VEMLIDY ) 25 MG TABS Take 1 tablet (25 mg total) by mouth daily. Take with food. (Patient not taking: Reported on 01/03/2024) 30 tablet 2   triamcinolone  ointment (KENALOG ) 0.5 % Apply 1 Application topically 2 (two) times daily. (Patient not taking: Reported on 01/03/2024) 30 g 3   vitamin C (ASCORBIC ACID) 500 MG tablet Take 500 mg by mouth daily.     No current facility-administered medications for this visit.   Facility-Administered Medications Ordered in Other Visits  Medication Dose Route Frequency Provider Last Rate Last Admin   0.9 %  sodium chloride  infusion   Intravenous Continuous Brahmanday, Govinda R, MD   Stopped at 09/11/20 1502   fludeoxyglucose  F - 18 (FDG) injection 9.4 millicurie  9.4 millicurie Intravenous Once PRN Luverne Aran, MD       heparin  lock flush 100 unit/mL  500 Units Intravenous Once Rao, Archana C, MD       heparin  lock flush 100 unit/mL  500 Units Intravenous Once Rao, Archana C, MD       methotrexate  (PF) 12 mg in sodium chloride  (PF) 0.9 % INTRATHECAL chemo injection   Intrathecal Once Rao, Archana C, MD       sodium chloride  flush (NS) 0.9 % injection 10 mL  10 mL Intracatheter PRN [name], MD   10 mL at 01/18/23 1545   sodium chloride  flush (NS) 0.9 % injection 10 mL  10  mL Intravenous Once Rao, Archana C, MD        Patient confirms/reports the following allergies:  No Known Allergies  No orders of the defined types were placed in this encounter.   AUTHORIZATION INFORMATION Primary Insurance: 1D#: Group #:  Secondary Insurance: 1D#: Group #:  SCHEDULE INFORMATION: Date: 03/30/24 Time: Location: [facility]

## 2024-01-09 ENCOUNTER — Inpatient Hospital Stay (HOSPITAL_BASED_OUTPATIENT_CLINIC_OR_DEPARTMENT_OTHER): Admitting: Oncology

## 2024-01-09 ENCOUNTER — Inpatient Hospital Stay

## 2024-01-09 ENCOUNTER — Encounter: Payer: Self-pay | Admitting: Oncology

## 2024-01-09 VITALS — BP 132/78 | HR 80 | Temp 97.2°F | Resp 16 | Wt 168.0 lb

## 2024-01-09 DIAGNOSIS — Z5112 Encounter for antineoplastic immunotherapy: Secondary | ICD-10-CM | POA: Diagnosis not present

## 2024-01-09 DIAGNOSIS — C8510 Unspecified B-cell lymphoma, unspecified site: Secondary | ICD-10-CM

## 2024-01-09 DIAGNOSIS — Z5111 Encounter for antineoplastic chemotherapy: Secondary | ICD-10-CM

## 2024-01-09 LAB — CBC WITH DIFFERENTIAL (CANCER CENTER ONLY)
Abs Immature Granulocytes: 0.09 K/uL — ABNORMAL HIGH (ref 0.00–0.07)
Basophils Absolute: 0.1 K/uL (ref 0.0–0.1)
Basophils Relative: 1 %
Eosinophils Absolute: 0.1 K/uL (ref 0.0–0.5)
Eosinophils Relative: 1 %
HCT: 37.9 % — ABNORMAL LOW (ref 39.0–52.0)
Hemoglobin: 12.9 g/dL — ABNORMAL LOW (ref 13.0–17.0)
Immature Granulocytes: 2 %
Lymphocytes Relative: 27 %
Lymphs Abs: 1.5 K/uL (ref 0.7–4.0)
MCH: 32.9 pg (ref 26.0–34.0)
MCHC: 34 g/dL (ref 30.0–36.0)
MCV: 96.7 fL (ref 80.0–100.0)
Monocytes Absolute: 0.6 K/uL (ref 0.1–1.0)
Monocytes Relative: 12 %
Neutro Abs: 3.1 K/uL (ref 1.7–7.7)
Neutrophils Relative %: 57 %
Platelet Count: 125 K/uL — ABNORMAL LOW (ref 150–400)
RBC: 3.92 MIL/uL — ABNORMAL LOW (ref 4.22–5.81)
RDW: 15.9 % — ABNORMAL HIGH (ref 11.5–15.5)
WBC Count: 5.4 K/uL (ref 4.0–10.5)
nRBC: 0 % (ref 0.0–0.2)

## 2024-01-09 LAB — CMP (CANCER CENTER ONLY)
ALT: 27 U/L (ref 0–44)
AST: 54 U/L — ABNORMAL HIGH (ref 15–41)
Albumin: 3.5 g/dL (ref 3.5–5.0)
Alkaline Phosphatase: 114 U/L (ref 38–126)
Anion gap: 7 (ref 5–15)
BUN: 11 mg/dL (ref 8–23)
CO2: 27 mmol/L (ref 22–32)
Calcium: 9.3 mg/dL (ref 8.9–10.3)
Chloride: 105 mmol/L (ref 98–111)
Creatinine: 0.91 mg/dL (ref 0.61–1.24)
GFR, Estimated: >60 mL/min (ref >60)
Glucose, Bld: 90 mg/dL (ref 70–99)
Potassium: 3.8 mmol/L (ref 3.5–5.1)
Sodium: 139 mmol/L (ref 135–145)
Total Bilirubin: 0.8 mg/dL (ref 0.0–1.2)
Total Protein: 6.5 g/dL (ref 6.5–8.1)

## 2024-01-09 NOTE — Progress Notes (Signed)
 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  This was followed by a PET CT scan which showed extensive FDG avid adenopathy in the neck chest abdomen and pelvis.  FDG avid splenic lesions.  Solitary intramuscular FDG avid lesion in the right biceps femoris muscle.   Supraclavicular excisional lymph node biopsy showed high-grade B-cell lymphoma germinal center type Ki-67 greater than 95%.  FISH testing was positive for Bcl-2 BCL6 and MYC consistent with triple hit lymphoma.  By NCCN IPI score would be 4 based on age and elevated LDH and stage IV (intramuscular biceps femoris lesion) which puts him in the high intermediate risk group. 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   Patient received RCHOP for cycle 1 with plans for DA [facility] with cycle 2. IT MTX for CNS prophylaxis and he has received 3 cycles but declined further cycles.  MRI brain negative for lymphoma.  Chemotherapy was not being able to escalated upwards due to neutropenia which has persisted to 3 weeks and he has been receiving treatment every 4 weeks   PET CT scan after 3 cycles of  chemotherapy showedComplete or near complete metabolic response.  Residual disease in the anterior upper right mediastinum slightly greater than background mediastinal activity   Patient found to have a clinically palpable right supraclavicular lymph node which was subsequently biopsied and was consistent with previously diagnosed lymphoma.  PET CT scan showedNew right level 5 adenopathy in the lower neck with dominant lymph node 1.6 cm Deauville 5.  No other findings of acute active lymphoma in chest abdomen pelvis and bones.  MRI brain negative for leptomeningeal disease.   Patient seen for second opinion by Dr. Gaylan at [facility] for consideration of CAR-T cell therapy.  Patient did not wish to proceed with CAR-T cell treatment after reviewing risks versus benefits.  Moreover patient's health insurance will also not cover CAR-T cell therapy. Patient started on second line tafasitamab /Revlimid  regimen in June 2023.  He is on Revlimid  15 mg 3 weeks on and 1 week off.  Disease progression after 3 cycles and patient was switched to [facility] regimen     Patient was noted to haveSignificant decrease in the size of supraclavicular lymph nodes consistent with treatment response Deauville 3 after 3 cycles of Pola BR chemotherapy.  Patient has only received 4 cycles of chemotherapy so far due to persistent neutropenia despite receiving growth factor support.  Repeat PET scan showed new/recurrent hypermetabolic subcutaneous tissue in the high right back/inferior neck consistent with recurrent lymphoma Deauville 5.  He received palliative radiation to that area.  Patient was started on Zynlonta  in June 2024 after he was found to have recurrent disease involving subcutaneous right scapular  region.  Patient desired treatment break and therefore treatment was held again in November 2024 when PET scan showed complete resolution of disease.  Treatment was restarted in March 2025 when he had recurrent disease noted on PET  scan  Interval history- Discussed the use of AI scribe software for clinical note transcription with the patient, who gave verbal consent to proceed.  History of Present Illness   Christopher Mills is a 63 year old male with diffuse large B cell lymphoma who presents for discussion of PET scan results and treatment planning.  He has been undergoing treatment for diffuse large B cell lymphoma. Recent PET scan results showed that previously noted spots in the liver are no longer visible, and the spots in the subcutaneous tissue and humerus appear less bright. A new spot in the lung was noted on the PET scan.  He feels well overall and wants to take a break from treatment, citing a previous attempt to take a break resulted in a recurrence of symptoms.  He has been experiencing eye discomfort, described as 'bothering me,' despite having 20/20 vision. An eye doctor attributed this to dry eyes and recommended lubricant eye drops, which he is using. He notes that his eyes feel 'funny' when exposed to sunlight.  He remembers taking Decadron  (dexamethasone ) the day before and two days after treatment last time, and confirms having enough medication for the next treatment cycle.   ECOG PS- 0 Pain scale- 0   Review of systems- Review of Systems  Constitutional:  Negative for chills, fever, malaise/fatigue and weight loss.  HENT:  Negative for congestion, ear discharge and nosebleeds.        Dry eyes  Eyes:  Negative for blurred vision.  Respiratory:  Negative for cough, hemoptysis, sputum production, shortness of breath and wheezing.   Cardiovascular:  Negative for chest pain, palpitations, orthopnea and claudication.  Gastrointestinal:  Negative for abdominal pain, blood in stool, constipation, diarrhea, heartburn, melena, nausea and vomiting.  Genitourinary:  Negative for dysuria, flank pain, frequency, hematuria and urgency.  Musculoskeletal:  Negative for back pain, joint pain and  myalgias.  Skin:  Negative for rash.  Neurological:  Negative for dizziness, tingling, focal weakness, seizures, weakness and headaches.  Endo/Heme/Allergies:  Does not bruise/bleed easily.  Psychiatric/Behavioral:  Negative for depression and suicidal ideas. The patient does not have insomnia.       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     Past Surgical History:  Procedure Laterality Date   BONE MARROW BIOPSY     COLONOSCOPY     EXCISION MASS NECK Right 06/12/2020   Procedure: EXCISION MASS NECK;  Surgeon: Christopher Laneta FALCON, MD;  Location: [facility] ORS;  Service: General;  Laterality: Right;   HERNIA REPAIR Right    at age 53-RIH   PORTA CATH INSERTION     PORTACATH PLACEMENT Right 06/24/2020   Procedure: INSERTION PORT-A-CATH;  Surgeon: Christopher Laneta FALCON, MD;  Location: [facility] ORS;  Service: General;  Laterality: Right;    Social History   Socioeconomic History   Marital status: Single    Spouse name: Not on file   Number of children: Not on file   Years of education: Not on file   Highest education level: Not on file  Occupational History   Not on file  Tobacco  Use   Smoking status: Former    Current packs/day: 0.00    Types: Cigarettes    Quit date: 06/13/2020    Years since quitting: 3.5   Smokeless tobacco: Never  Vaping Use   Vaping status: Never Used  Substance and Sexual Activity   Alcohol use: Not Currently   Drug use: Not Currently    Types: Marijuana   Sexual activity: Not Currently  Other Topics Concern   Not on file  Social History Narrative   Has daughter and grandson in the home   Social Drivers of Health   Financial Resource Strain: Medium Risk (07/30/2021)   Received from [facility]   Overall Financial Resource Strain (CARDIA)    Difficulty of Paying Living Expenses: Somewhat hard  Food Insecurity: No Food  Insecurity (07/30/2021)   Received from [facility]   Hunger Vital Sign    Within the past 12 months, you worried that your food would run out before you got the money to buy more.: Never true    Within the past 12 months, the food you bought just didn't last and you didn't have money to get more.: Never true  Transportation Needs: No Transportation Needs (07/30/2021)   Received from [facility]   PRAPARE - Transportation    Lack of Transportation (Medical): No    Lack of Transportation (Non-Medical): No  Physical Activity: Not on file  Stress: No Stress Concern Present (07/30/2021)   Received from [facility] - Occupational Stress Questionnaire    Feeling of Stress : Not at all  Social Connections: Not on file  Intimate Partner Violence: Not on file    Family History  Problem Relation Age of Onset   Anemia Mother    Hypertension Mother    Goiter Mother    Cancer Father    Cancer Sister    Multiple sclerosis Sister      Current Outpatient Medications:    aspirin  EC 81 MG tablet, Take 1 tablet (81 mg total) by mouth daily. Swallow whole. (Patient not taking: Reported on 01/09/2024), Disp: 30 tablet, Rfl: 0   atenolol (TENORMIN) 25 MG tablet, Take 1 tablet (25 mg total) by mouth daily., Disp: 90 tablet, Rfl: 1   cholecalciferol (VITAMIN D3) 25 MCG (1000 UNIT) tablet, Take 1,000 Units by mouth daily., Disp: , Rfl:    diphenhydrAMINE  (BENADRYL ) 2 % cream, Apply 1 Application topically 3 (three) times daily as needed for itching., Disp: 30 g, Rfl: 3   diphenhydrAMINE  (BENADRYL ) 25 mg capsule, Take 25 mg by mouth every 6 (six) hours as needed for itching or allergies., Disp: , Rfl:    lisinopril (ZESTRIL) 10 MG tablet, Take 1 tablet (10 mg total) by mouth daily., Disp: 90 tablet, Rfl: 1   Omega-3 Fatty Acids (FISH OIL ADULT GUMMIES PO), Take by mouth., Disp: , Rfl:    predniSONE  (DELTASONE ) 10 MG tablet, Take 5 tablets (50 mg) by  mouth once daily on Day 1. Then take 4 tablets (40 mg) on Day 2, then take 3 tablets (30 mg) on Day 3, then take 2 tablets (20 mg) on Day 4, then take 1 tablet (10 mg) on Day 5, then take 1/2 tablet (5 mg) on Day 6, then stop., Disp: 16 tablet, Rfl: 0   Tenofovir  Alafenamide Fumarate (VEMLIDY ) 25 MG TABS, Take 1 tablet (25 mg total) by mouth daily. Take with food. (Patient not taking: Reported on 01/09/2024), Disp:  30 tablet, Rfl: 2   triamcinolone  ointment (KENALOG ) 0.5 %, Apply 1 Application topically 2 (two) times daily. (Patient not taking: Reported on 01/09/2024), Disp: 30 g, Rfl: 3   vitamin C (ASCORBIC ACID) 500 MG tablet, Take 500 mg by mouth daily., Disp: , Rfl:  No current facility-administered medications for this visit.  Facility-Administered Medications Ordered in Other Visits:    0.9 %  sodium chloride  infusion, , Intravenous, Continuous, Brahmanday, Govinda R, MD, Stopped at 09/11/20 1502   heparin  lock flush 100 unit/mL, 500 Units, Intravenous, Once, Christopher Annah BROCKS, MD   heparin  lock flush 100 unit/mL, 500 Units, Intravenous, Once, Christopher Annah BROCKS, MD   methotrexate  (PF) 12 mg in sodium chloride  (PF) 0.9 % INTRATHECAL chemo injection, , Intrathecal, Once, Christopher Annah BROCKS, MD   sodium chloride  flush (NS) 0.9 % injection 10 mL, 10 mL, Intracatheter, PRN, Christopher Annah BROCKS, MD, 10 mL at 01/18/23 1545   sodium chloride  flush (NS) 0.9 % injection 10 mL, 10 mL, Intravenous, Once, Christopher Annah BROCKS, MD  Physical exam: There were no vitals filed for this visit. Physical Exam Cardiovascular:     Rate and Rhythm: Normal rate and regular rhythm.     Heart sounds: Normal heart sounds.  Pulmonary:     Effort: Pulmonary effort is normal.     Breath sounds: Normal breath sounds.  Skin:    General: Skin is warm and dry.  Neurological:     Mental Status: He is alert and oriented to person, place, and time.      I have personally reviewed labs listed below:    Latest Ref Rng & Units 12/19/2023     8:06 AM  CMP  Glucose 70 - 99 mg/dL 897   BUN 8 - 23 mg/dL 9   Creatinine 9.38 - 8.75 mg/dL 9.24   Sodium 864 - 854 mmol/L 142   Potassium 3.5 - 5.1 mmol/L 3.7   Chloride 98 - 111 mmol/L 108   CO2 22 - 32 mmol/L 26   Calcium 8.9 - 10.3 mg/dL 9.3   Total Protein 6.5 - 8.1 g/dL 6.6   Total Bilirubin 0.0 - 1.2 mg/dL 0.6   Alkaline Phos 38 - 126 U/L 132   AST 15 - 41 U/L 47   ALT 0 - 44 U/L 22       Latest Ref Rng & Units 12/19/2023    8:06 AM  CBC  WBC 4.0 - 10.5 K/uL 6.1   Hemoglobin 13.0 - 17.0 g/dL 87.5   Hematocrit 60.9 - 52.0 % 36.3   Platelets 150 - 400 K/uL 138    I have personally reviewed Radiology images listed below: No images are attached to the encounter.  NM PET Image Restag (PS) Skull Base To Thigh Result Date: 01/03/2024 EXAM: PET AND CT SKULL BASE TO MID THIGH 01/02/2024 09:27:16 AM TECHNIQUE: RADIOPHARMACEUTICAL: 9.4 mCi F-18 FDG Uptake time 60 minutes. Glucose level 82 mg/dl. PET imaging was acquired from the base of the skull to the mid thighs. Non-contrast enhanced computed tomography was obtained for attenuation correction and anatomic localization. COMPARISON: FDG PET scan 09/06/2023. CLINICAL HISTORY: High grade B-cell lymphoma (HCC). 9.4 mCi F18 FDG Given.; Blood Glucose: 82 mg/dl; B-cell lymphoma; NPO; Not diabetic; Not currently on antibiotics/steroids; No recent surgery/biopsy; Last immuno therapy dose 3 weeks ago. FINDINGS: HEAD AND NECK: No metabolically active cervical lymphadenopathy. CHEST: Decrease in metabolic activity of 2 subcutaneous nodules over the right deltoid muscle. For example, the more superior nodule measuring  12 mm (image 40) has not changed in size but decreased in metabolic activity with SUV max equal to 2.7 (decrease from SUV max equal to 6.7). The more inferior subcutaneous nodule (image 47) has SUV max equal to 7.8 compared to SUV max equal to 8.4. Solitary lesion within the adjacent proximal humerus is decreased in metabolic activity  with SUV max equal to 1.9 compared to SUV max equal to 9.2. There are no new signs of hypermetabolic activity. Subpleural nodularity in the right upper lobe on image 50 does not have significant metabolic activity. No metabolically active pulmonary nodules. No metabolically active lymphadenopathy. ABDOMEN AND PELVIS: Normal liver activity equals 2.9 and mediastinal blood pool activity equals 1.8. No metabolically active intraperitoneal mass. No metabolically active lymphadenopathy. Physiologic activity within the gastrointestinal and genitourinary systems. BONES AND SOFT TISSUE: There is uniform increase in marrow activity consistent with a GCSF type response. No abnormal FDG activity localizes to the bones. No metabolically active aggressive osseous lesion. IMPRESSION: 1. Interval decrease in metabolic activity of two subcutaneous nodules over the right deltoid and a solitary lesion within the adjacent proximal humerus, without change in size. 2. One of the two subcutaneous nodules in the right shoulder does have metabolic activity significantly greater than liver activity. 3. No new hypermetabolic lesions identified. Electronically signed by: Norleen Boxer MD 01/03/2024 04:46 PM EDT RP Workstation: [id number]   MR Brain W Wo Contrast Result Date: 12/31/2023 EXAM: MRI BRAIN WITH AND WITHOUT CONTRAST 12/29/2023 08:53:40 AM TECHNIQUE: Multiplanar multisequence MRI of the head/brain was performed with and without the administration of intravenous contrast. COMPARISON: MRI head 07/25/2021. CLINICAL HISTORY: photophobia. Patient states having drainage in eyes in AM with vision changes; patient has history of lymphoma; 7.5 ml of GAD. FINDINGS: BRAIN AND VENTRICLES: No acute infarct. No acute intracranial hemorrhage. No mass effect or midline shift. No hydrocephalus. No abnormal intracranial enhancement. The sella is unremarkable. Normal flow voids. ORBITS: No acute abnormality. SINUSES: No acute abnormality. BONES AND  SOFT TISSUES: Normal bone marrow signal and enhancement. No acute soft tissue abnormality. IMPRESSION: 1. Normal brain MRI. 2. No abnormal intracranial enhancement. Electronically signed by: Donnice Mania MD 12/31/2023 01:11 PM EDT RP Workstation: [id number]     Assessment and plan- Patient is a 63 y.o. male with history of relapsed triple had diffuse large B-cell lymphoma presently on Zynlonta  here to discuss PET scan results and further management  Assessment and Plan    Diffuse large B-cell lymphoma I have reviewed PET CT scan images independently and discussed findings with the patient.  Diffuse large B-cell lymphoma with prior liver, subcutaneous tissue, and humerus involvement. PET scan shows decreased activity, indicating treatment response. New lung spot shows no activity, suggesting no active disease. Aggressive lymphoma with recurrence on extended treatment intervals.   Eye symptoms due to dry eyes, unrelated to lymphoma or treatment. - Patient does not wish to proceed with chemotherapy today and wants to defer treatment out by 3 weeks. - Schedule next chemotherapy for January 30, 2024. - Ensure Decadron  is taken one day prior and for two days post-chemotherapy.         Visit Diagnosis 1. Encounter for antineoplastic chemotherapy   2. High grade B-cell lymphoma (HCC)      Dr. Annah Skene, MD, MPH [facility] [phone] 01/09/2024 8:31 AM

## 2024-01-11 ENCOUNTER — Inpatient Hospital Stay

## 2024-01-13 ENCOUNTER — Encounter: Payer: Self-pay | Admitting: Oncology

## 2024-01-17 ENCOUNTER — Encounter: Payer: Self-pay | Admitting: Cardiology

## 2024-01-17 ENCOUNTER — Ambulatory Visit: Admitting: Cardiology

## 2024-01-17 ENCOUNTER — Ambulatory Visit: Payer: Self-pay | Admitting: Cardiology

## 2024-01-17 VITALS — BP 128/76 | HR 80 | Ht 73.0 in | Wt 169.8 lb

## 2024-01-17 DIAGNOSIS — I10 Essential (primary) hypertension: Secondary | ICD-10-CM

## 2024-01-17 DIAGNOSIS — R350 Frequency of micturition: Secondary | ICD-10-CM | POA: Diagnosis not present

## 2024-01-17 DIAGNOSIS — N3000 Acute cystitis without hematuria: Secondary | ICD-10-CM | POA: Diagnosis not present

## 2024-01-17 LAB — POCT URINALYSIS DIPSTICK
Bilirubin, UA: NEGATIVE
Glucose, UA: NEGATIVE
Ketones, UA: NEGATIVE
Nitrite, UA: NEGATIVE
Protein, UA: NEGATIVE
Spec Grav, UA: 1.015 (ref 1.010–1.025)
Urobilinogen, UA: 0.2 U/dL
pH, UA: 6 (ref 5.0–8.0)

## 2024-01-17 MED ORDER — NITROFURANTOIN MONOHYD MACRO 100 MG PO CAPS: 100.0000 mg | ORAL_CAPSULE | Freq: Two times a day (BID) | ORAL | 0 refills | Status: AC

## 2024-01-17 NOTE — Progress Notes (Signed)
 Established Patient Office Visit  Subjective:  Patient ID: Christopher Mills, male    DOB: [birth year]  Age: 63 y.o. MRN: [id number]  Chief Complaint  Patient presents with   Acute Visit    Possible UTI, dark urine, painful urination, lower abdomen groin pain, started 4 days ago. Does have lymphoma.    Patient in office for an acute visit. Patient complaining of possible UTI. Dark urine, painful urination, lower abdomen pain. Symptoms started about 4 days ago. UA today abnormal. Will send for culture. Will send in Macrobid.  Blood pressure controlled today. Normal kidney function on recent lab work.   Urinary Tract Infection  This is a new problem. The current episode started in the past 7 days. The problem occurs every urination. The problem has been unchanged. The quality of the pain is described as burning. There has been no fever. Pertinent negatives include no chills, flank pain, hematuria or nausea. He has tried nothing for the symptoms. The treatment provided no relief.    No other concerns at this time.   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   Salmonella gastroenteritis     Past Surgical History:  Procedure Laterality Date   BONE MARROW BIOPSY     COLONOSCOPY     EXCISION MASS NECK Right 06/12/2020   Procedure: EXCISION MASS NECK;  Surgeon: [name], MD;  Location: [facility] ORS;  Service: General;  Laterality: Right;   HERNIA REPAIR Right    at age 53-RIH   22 CATH INSERTION     PORTACATH PLACEMENT Right 06/24/2020   Procedure: INSERTION PORT-A-CATH;  Surgeon: [name], MD;  Location: [facility] ORS;  Service: General;  Laterality: Right;    Social History   Socioeconomic History   Marital status: Single    Spouse name: Not on file   Number of children: Not on file   Years of education: Not on file   Highest education  level: Not on file  Occupational History   Not on file  Tobacco Use   Smoking status: Former    Current packs/day: 0.00    Types: Cigarettes    Quit date: 06/13/2020    Years since quitting: 3.5   Smokeless tobacco: Never  Vaping Use   Vaping status: Never Used  Substance and Sexual Activity   Alcohol use: Not Currently   Drug use: Not Currently    Types: Marijuana   Sexual activity: Not Currently  Other Topics Concern   Not on file  Social History Narrative   Has daughter and grandson in the home   Social Drivers of Health   Financial Resource Strain: Medium Risk (07/30/2021)   Received from [facility]   Overall Financial Resource Strain (CARDIA)    Difficulty of Paying Living Expenses: Somewhat hard  Food Insecurity: No Food Insecurity (07/30/2021)   Received from [facility]   Hunger Vital Sign    Within the past 12 months, you worried that your food would run out before you got the money to buy more.: Never true    Within the past 12 months, the food you bought just didn't last and you didn't have money to get more.: Never true  Transportation Needs: No Transportation Needs (07/30/2021)   Received from [facility]   [facility] - Transportation  Lack of Transportation (Medical): No    Lack of Transportation (Non-Medical): No  Physical Activity: Not on file  Stress: No Stress Concern Present (07/30/2021)   Received from [facility] - Occupational Stress Questionnaire    Feeling of Stress : Not at all  Social Connections: Not on file  Intimate Partner Violence: Not on file    Family History  Problem Relation Age of Onset   Anemia Mother    Hypertension Mother    Goiter Mother    Cancer Father    Cancer Sister    Multiple sclerosis Sister     No Known Allergies  Outpatient Medications Prior to Visit  Medication Sig   atenolol (TENORMIN) 25 MG tablet Take 1 tablet (25 mg total) by mouth daily.    cholecalciferol (VITAMIN D3) 25 MCG (1000 UNIT) tablet Take 1,000 Units by mouth daily.   diphenhydrAMINE  (BENADRYL ) 2 % cream Apply 1 Application topically 3 (three) times daily as needed for itching.   diphenhydrAMINE  (BENADRYL ) 25 mg capsule Take 25 mg by mouth every 6 (six) hours as needed for itching or allergies.   lisinopril (ZESTRIL) 10 MG tablet Take 1 tablet (10 mg total) by mouth daily.   Omega-3 Fatty Acids (FISH OIL ADULT GUMMIES PO) Take by mouth.   predniSONE  (DELTASONE ) 10 MG tablet Take 5 tablets (50 mg) by mouth once daily on Day 1. Then take 4 tablets (40 mg) on Day 2, then take 3 tablets (30 mg) on Day 3, then take 2 tablets (20 mg) on Day 4, then take 1 tablet (10 mg) on Day 5, then take 1/2 tablet (5 mg) on Day 6, then stop. (Patient taking differently: Take 10 mg by mouth as needed. Take 5 tablets (50 mg) by mouth once daily on Day 1. Then take 4 tablets (40 mg) on Day 2, then take 3 tablets (30 mg) on Day 3, then take 2 tablets (20 mg) on Day 4, then take 1 tablet (10 mg) on Day 5, then take 1/2 tablet (5 mg) on Day 6, then stop.)   vitamin C (ASCORBIC ACID) 500 MG tablet Take 500 mg by mouth daily.   aspirin  EC 81 MG tablet Take 1 tablet (81 mg total) by mouth daily. Swallow whole. (Patient not taking: Reported on 01/17/2024)   Tenofovir  Alafenamide Fumarate (VEMLIDY ) 25 MG TABS Take 1 tablet (25 mg total) by mouth daily. Take with food. (Patient not taking: Reported on 01/17/2024)   triamcinolone  ointment (KENALOG ) 0.5 % Apply 1 Application topically 2 (two) times daily. (Patient not taking: Reported on 01/17/2024)   Facility-Administered Medications Prior to Visit  Medication Dose Route Frequency Provider   0.9 %  sodium chloride  infusion   Intravenous Continuous Brahmanday, Govinda R, MD   heparin  lock flush 100 unit/mL  500 Units Intravenous Once Rao, Archana C, MD   heparin  lock flush 100 unit/mL  500 Units Intravenous Once Rao, Archana C, MD   methotrexate  (PF) 12 mg in  sodium chloride  (PF) 0.9 % INTRATHECAL chemo injection   Intrathecal Once Rao, Archana C, MD   sodium chloride  flush (NS) 0.9 % injection 10 mL  10 mL Intracatheter PRN [name], MD   sodium chloride  flush (NS) 0.9 % injection 10 mL  10 mL Intravenous Once Rao, Archana C, MD    Review of Systems  Constitutional: Negative.  Negative for chills.  HENT: Negative.    Eyes: Negative.   Respiratory: Negative.  Negative for shortness of breath.   Cardiovascular: Negative.  Negative for chest pain.  Gastrointestinal: Negative.  Negative for abdominal pain, constipation, diarrhea and nausea.  Genitourinary:  Positive for dysuria. Negative for flank pain and hematuria.  Musculoskeletal:  Negative for joint pain and myalgias.  Skin: Negative.   Neurological: Negative.  Negative for dizziness and headaches.  Endo/Heme/Allergies: Negative.   Psychiatric/Behavioral: Negative.    All other systems reviewed and are negative.      Objective:   BP 128/76   Pulse 80   Ht 6' 1 (1.854 m)   Wt 169 lb 12.8 oz (77 kg)   SpO2 99%   BMI 22.40 kg/m   Vitals:   01/17/24 1331  BP: 128/76  Pulse: 80  Height: 6' 1 (1.854 m)  Weight: 169 lb 12.8 oz (77 kg)  SpO2: 99%  BMI (Calculated): 22.41    Physical Exam Nursing note reviewed.  Constitutional:      Appearance: Normal appearance. He is normal weight.  HENT:     Head: Normocephalic and atraumatic.     Nose: Nose normal.     Mouth/Throat:     Mouth: Mucous membranes are moist.     Pharynx: Oropharynx is clear.  Eyes:     Extraocular Movements: Extraocular movements intact.     Conjunctiva/sclera: Conjunctivae normal.     Pupils: Pupils are equal, round, and reactive to light.  Cardiovascular:     Rate and Rhythm: Normal rate and regular rhythm.     Pulses: Normal pulses.     Heart sounds: Normal heart sounds.  Pulmonary:     Effort: Pulmonary effort is normal.     Breath sounds: Normal breath sounds.  Abdominal:     General:  Abdomen is flat. Bowel sounds are normal.     Palpations: Abdomen is soft.  Musculoskeletal:        General: Normal range of motion.     Cervical back: Normal range of motion.  Skin:    General: Skin is warm and dry.  Neurological:     General: No focal deficit present.     Mental Status: He is alert and oriented to person, place, and time.  Psychiatric:        Mood and Affect: Mood normal.        Behavior: Behavior normal.        Thought Content: Thought content normal.        Judgment: Judgment normal.      Results for orders placed or performed in visit on 01/17/24  POCT urinalysis dipstick  Result Value Ref Range   Color, UA     Clarity, UA     Glucose, UA Negative Negative   Bilirubin, UA Negative    Ketones, UA Negative    Spec Grav, UA 1.015 1.010 - 1.025   Blood, UA 2+    pH, UA 6.0 5.0 - 8.0   Protein, UA Negative Negative   Urobilinogen, UA 0.2 0.2 or 1.0 E.U./dL   Nitrite, UA Negative    Leukocytes, UA Small (1+) (A) Negative   Appearance     Odor      Recent Results (from the past 2160 hours)  CMP (Cancer Center only)     Status: Abnormal   Collection Time: 11/07/23  8:37 AM  Result Value Ref Range   Sodium 137 135 - 145 mmol/L   Potassium 3.3 (L) 3.5 - 5.1 mmol/L   Chloride 106 98 - 111 mmol/L  CO2 24 22 - 32 mmol/L   Glucose, Bld 134 (H) 70 - 99 mg/dL    Comment: Glucose reference range applies only to samples taken after fasting for at least 8 hours.   BUN 9 8 - 23 mg/dL   Creatinine 9.08 9.38 - 1.24 mg/dL   Calcium 9.0 8.9 - 89.6 mg/dL   Total Protein 6.3 (L) 6.5 - 8.1 g/dL   Albumin 3.6 3.5 - 5.0 g/dL   AST 45 (H) 15 - 41 U/L   ALT 22 0 - 44 U/L   Alkaline Phosphatase 121 38 - 126 U/L   Total Bilirubin 0.8 0.0 - 1.2 mg/dL   GFR, Estimated >39 >39 mL/min    Comment: (NOTE) Calculated using the CKD-EPI Creatinine Equation (2021)    Anion gap 7 5 - 15    Comment: Performed at [facility], [address] [place], [place] [address]   CBC with Differential (Cancer Center Only)     Status: Abnormal   Collection Time: 11/07/23  8:37 AM  Result Value Ref Range   WBC Count 4.7 4.0 - 10.5 K/uL   RBC 3.89 (L) 4.22 - 5.81 MIL/uL   Hemoglobin 12.4 (L) 13.0 - 17.0 g/dL   HCT 62.6 (L) 60.9 - 47.9 %   MCV 95.9 80.0 - 100.0 fL   MCH 31.9 26.0 - 34.0 pg   MCHC 33.2 30.0 - 36.0 g/dL   RDW 84.8 88.4 - 84.4 %   Platelet Count 114 (L) 150 - 400 K/uL   nRBC 0.0 0.0 - 0.2 %   Neutrophils Relative % 63 %   Neutro Abs 3.0 1.7 - 7.7 K/uL   Lymphocytes Relative 22 %   Lymphs Abs 1.1 0.7 - 4.0 K/uL   Monocytes Relative 9 %   Monocytes Absolute 0.4 0.1 - 1.0 K/uL   Eosinophils Relative 1 %   Eosinophils Absolute 0.0 0.0 - 0.5 K/uL   Basophils Relative 1 %   Basophils Absolute 0.0 0.0 - 0.1 K/uL   Immature Granulocytes 4 %   Abs Immature Granulocytes 0.20 (H) 0.00 - 0.07 K/uL    Comment: Performed at [facility], [address] [place], [place] [address]  CBC with Differential (Cancer Center Only)     Status: Abnormal   Collection Time: 11/28/23  8:22 AM  Result Value Ref Range   WBC Count 5.8 4.0 - 10.5 K/uL   RBC 3.96 (L) 4.22 - 5.81 MIL/uL   Hemoglobin 12.7 (L) 13.0 - 17.0 g/dL   HCT 61.9 (L) 60.9 - 47.9 %   MCV 96.0 80.0 - 100.0 fL   MCH 32.1 26.0 - 34.0 pg   MCHC 33.4 30.0 - 36.0 g/dL   RDW 84.1 (H) 88.4 - 84.4 %   Platelet Count 121 (L) 150 - 400 K/uL   nRBC 0.0 0.0 - 0.2 %   Neutrophils Relative % 64 %   Neutro Abs 3.7 1.7 - 7.7 K/uL   Lymphocytes Relative 24 %   Lymphs Abs 1.4 0.7 - 4.0 K/uL   Monocytes Relative 8 %   Monocytes Absolute 0.4 0.1 - 1.0 K/uL   Eosinophils Relative 1 %   Eosinophils Absolute 0.1 0.0 - 0.5 K/uL   Basophils Relative 1 %   Basophils Absolute 0.1 0.0 - 0.1 K/uL   Immature Granulocytes 2 %   Abs Immature Granulocytes 0.12 (H) 0.00 - 0.07 K/uL    Comment: Performed at [facility], [address], [place], [place] [address]  CMP (Cancer Center only)     Status: Abnormal    Collection Time: 11/28/23  8:22 AM  Result Value Ref Range   Sodium 139 135 - 145 mmol/L   Potassium 3.8 3.5 - 5.1 mmol/L   Chloride 105 98 - 111 mmol/L   CO2 26 22 - 32 mmol/L   Glucose, Bld 101 (H) 70 - 99 mg/dL    Comment: Glucose reference range applies only to samples taken after fasting for at least 8 hours.   BUN 9 8 - 23 mg/dL   Creatinine 9.13 9.38 - 1.24 mg/dL   Calcium 8.9 8.9 - 89.6 mg/dL   Total Protein 6.4 (L) 6.5 - 8.1 g/dL   Albumin 3.8 3.5 - 5.0 g/dL   AST 51 (H) 15 - 41 U/L   ALT 24 0 - 44 U/L   Alkaline Phosphatase 142 (H) 38 - 126 U/L   Total Bilirubin 0.9 0.0 - 1.2 mg/dL   GFR, Estimated >39 >39 mL/min    Comment: (NOTE) Calculated using the CKD-EPI Creatinine Equation (2021)    Anion gap 8 5 - 15    Comment: Performed at [facility], [address] [place], [place] [address]  CMP (Cancer Center only)     Status: Abnormal   Collection Time: 12/19/23  8:06 AM  Result Value Ref Range   Sodium 142 135 - 145 mmol/L   Potassium 3.7 3.5 - 5.1 mmol/L   Chloride 108 98 - 111 mmol/L   CO2 26 22 - 32 mmol/L   Glucose, Bld 102 (H) 70 - 99 mg/dL    Comment: Glucose reference range applies only to samples taken after fasting for at least 8 hours.   BUN 9 8 - 23 mg/dL   Creatinine 9.24 9.38 - 1.24 mg/dL   Calcium 9.3 8.9 - 89.6 mg/dL   Total Protein 6.6 6.5 - 8.1 g/dL   Albumin 3.7 3.5 - 5.0 g/dL   AST 47 (H) 15 - 41 U/L   ALT 22 0 - 44 U/L   Alkaline Phosphatase 132 (H) 38 - 126 U/L   Total Bilirubin 0.6 0.0 - 1.2 mg/dL   GFR, Estimated >39 >39 mL/min    Comment: (NOTE) Calculated using the CKD-EPI Creatinine Equation (2021)    Anion gap 8 5 - 15    Comment: Performed at [facility], [address] [place], [place] [address]  CBC with Differential (Cancer Center Only)     Status: Abnormal   Collection Time: 12/19/23  8:06 AM  Result Value Ref Range   WBC Count 6.1 4.0 - 10.5 K/uL   RBC 3.81 (L) 4.22 - 5.81 MIL/uL   Hemoglobin 12.4 (L)  13.0 - 17.0 g/dL   HCT 63.6 (L) 60.9 - 47.9 %   MCV 95.3 80.0 - 100.0 fL   MCH 32.5 26.0 - 34.0 pg   MCHC 34.2 30.0 - 36.0 g/dL   RDW 84.1 (H) 88.4 - 84.4 %   Platelet Count 138 (L) 150 - 400 K/uL   nRBC 0.0 0.0 - 0.2 %   Neutrophils Relative % 56 %   Neutro Abs 3.4 1.7 - 7.7 K/uL   Lymphocytes Relative 31 %   Lymphs Abs 1.9 0.7 - 4.0 K/uL   Monocytes Relative 9 %   Monocytes Absolute 0.5 0.1 - 1.0 K/uL   Eosinophils Relative 0 %   Eosinophils Absolute 0.0 0.0 - 0.5 K/uL   Basophils Relative 1 %   Basophils Absolute 0.1 0.0 -  0.1 K/uL   Immature Granulocytes 3 %   Abs Immature Granulocytes 0.15 (H) 0.00 - 0.07 K/uL    Comment: Performed at [facility], [address] [place], [place] [address]  Glucose, capillary     Status: None   Collection Time: 01/02/24  8:03 AM  Result Value Ref Range   Glucose-Capillary 82 70 - 99 mg/dL    Comment: Glucose reference range applies only to samples taken after fasting for at least 8 hours.  CMP (Cancer Center only)     Status: Abnormal   Collection Time: 01/09/24  8:16 AM  Result Value Ref Range   Sodium 139 135 - 145 mmol/L   Potassium 3.8 3.5 - 5.1 mmol/L   Chloride 105 98 - 111 mmol/L   CO2 27 22 - 32 mmol/L   Glucose, Bld 90 70 - 99 mg/dL    Comment: Glucose reference range applies only to samples taken after fasting for at least 8 hours.   BUN 11 8 - 23 mg/dL   Creatinine 9.08 9.38 - 1.24 mg/dL   Calcium 9.3 8.9 - 89.6 mg/dL   Total Protein 6.5 6.5 - 8.1 g/dL   Albumin 3.5 3.5 - 5.0 g/dL   AST 54 (H) 15 - 41 U/L   ALT 27 0 - 44 U/L   Alkaline Phosphatase 114 38 - 126 U/L   Total Bilirubin 0.8 0.0 - 1.2 mg/dL   GFR, Estimated >39 >39 mL/min    Comment: (NOTE) Calculated using the CKD-EPI Creatinine Equation (2021)    Anion gap 7 5 - 15    Comment: Performed at [facility], [address] [place], [place] [address]  CBC with Differential (Cancer Center Only)     Status: Abnormal   Collection Time: 01/09/24   8:16 AM  Result Value Ref Range   WBC Count 5.4 4.0 - 10.5 K/uL   RBC 3.92 (L) 4.22 - 5.81 MIL/uL   Hemoglobin 12.9 (L) 13.0 - 17.0 g/dL   HCT 62.0 (L) 60.9 - 47.9 %   MCV 96.7 80.0 - 100.0 fL   MCH 32.9 26.0 - 34.0 pg   MCHC 34.0 30.0 - 36.0 g/dL   RDW 84.0 (H) 88.4 - 84.4 %   Platelet Count 125 (L) 150 - 400 K/uL   nRBC 0.0 0.0 - 0.2 %   Neutrophils Relative % 57 %   Neutro Abs 3.1 1.7 - 7.7 K/uL   Lymphocytes Relative 27 %   Lymphs Abs 1.5 0.7 - 4.0 K/uL   Monocytes Relative 12 %   Monocytes Absolute 0.6 0.1 - 1.0 K/uL   Eosinophils Relative 1 %   Eosinophils Absolute 0.1 0.0 - 0.5 K/uL   Basophils Relative 1 %   Basophils Absolute 0.1 0.0 - 0.1 K/uL   Immature Granulocytes 2 %   Abs Immature Granulocytes 0.09 (H) 0.00 - 0.07 K/uL    Comment: Performed at [facility], [address] [place], [place] [address]  POCT urinalysis dipstick     Status: Abnormal   Collection Time: 01/17/24  1:42 PM  Result Value Ref Range   Color, UA     Clarity, UA     Glucose, UA Negative Negative   Bilirubin, UA Negative    Ketones, UA Negative    Spec Grav, UA 1.015 1.010 - 1.025   Blood, UA 2+    pH, UA 6.0 5.0 - 8.0   Protein, UA Negative Negative   Urobilinogen, UA 0.2 0.2 or  1.0 E.U./dL   Nitrite, UA Negative    Leukocytes, UA Small (1+) (A) Negative   Appearance     Odor        Assessment & Plan:  Urine culture Macrobid  Problem List Items Addressed This Visit       Cardiovascular and Mediastinum   Hypertension     Genitourinary   Acute cystitis without hematuria - Primary     Other   Frequent urination   Relevant Orders   POCT urinalysis dipstick (Completed)   Urine Culture    Return if symptoms worsen or fail to improve, for as scheduled.   Total time spent: 25 minutes. This time includes review of previous notes and results and patient face to face interaction during today's visit.    Jeoffrey Pollen, NP  01/17/2024   This document may have  been prepared by Dragon Voice Recognition software and as such may include unintentional dictation errors.

## 2024-01-19 LAB — URINE CULTURE: Organism ID, Bacteria: NO GROWTH

## 2024-01-30 ENCOUNTER — Inpatient Hospital Stay

## 2024-01-30 ENCOUNTER — Inpatient Hospital Stay: Attending: Oncology

## 2024-01-30 ENCOUNTER — Inpatient Hospital Stay (HOSPITAL_BASED_OUTPATIENT_CLINIC_OR_DEPARTMENT_OTHER): Admitting: Oncology

## 2024-01-30 ENCOUNTER — Encounter: Payer: Self-pay | Admitting: Oncology

## 2024-01-30 ENCOUNTER — Ambulatory Visit: Payer: Self-pay | Admitting: Oncology

## 2024-01-30 ENCOUNTER — Other Ambulatory Visit: Payer: Self-pay

## 2024-01-30 VITALS — BP 145/81 | HR 71 | Temp 97.8°F | Resp 20 | Wt 169.1 lb

## 2024-01-30 DIAGNOSIS — Z59868 Other specified financial insecurity: Secondary | ICD-10-CM | POA: Diagnosis not present

## 2024-01-30 DIAGNOSIS — Z87891 Personal history of nicotine dependence: Secondary | ICD-10-CM | POA: Insufficient documentation

## 2024-01-30 DIAGNOSIS — Z8349 Family history of other endocrine, nutritional and metabolic diseases: Secondary | ICD-10-CM | POA: Insufficient documentation

## 2024-01-30 DIAGNOSIS — H04209 Unspecified epiphora, unspecified lacrimal gland: Secondary | ICD-10-CM | POA: Insufficient documentation

## 2024-01-30 DIAGNOSIS — Z79631 Long term (current) use of antimetabolite agent: Secondary | ICD-10-CM | POA: Diagnosis not present

## 2024-01-30 DIAGNOSIS — Z79899 Other long term (current) drug therapy: Secondary | ICD-10-CM | POA: Diagnosis not present

## 2024-01-30 DIAGNOSIS — Z820 Family history of epilepsy and other diseases of the nervous system: Secondary | ICD-10-CM | POA: Insufficient documentation

## 2024-01-30 DIAGNOSIS — Z5111 Encounter for antineoplastic chemotherapy: Secondary | ICD-10-CM

## 2024-01-30 DIAGNOSIS — T451X5A Adverse effect of antineoplastic and immunosuppressive drugs, initial encounter: Secondary | ICD-10-CM | POA: Insufficient documentation

## 2024-01-30 DIAGNOSIS — C8510 Unspecified B-cell lymphoma, unspecified site: Secondary | ICD-10-CM

## 2024-01-30 DIAGNOSIS — R590 Localized enlarged lymph nodes: Secondary | ICD-10-CM | POA: Diagnosis not present

## 2024-01-30 DIAGNOSIS — H269 Unspecified cataract: Secondary | ICD-10-CM | POA: Diagnosis not present

## 2024-01-30 DIAGNOSIS — Z87440 Personal history of urinary (tract) infections: Secondary | ICD-10-CM | POA: Diagnosis not present

## 2024-01-30 DIAGNOSIS — I10 Essential (primary) hypertension: Secondary | ICD-10-CM | POA: Insufficient documentation

## 2024-01-30 DIAGNOSIS — Z7961 Long term (current) use of immunomodulator: Secondary | ICD-10-CM | POA: Diagnosis not present

## 2024-01-30 DIAGNOSIS — D701 Agranulocytosis secondary to cancer chemotherapy: Secondary | ICD-10-CM

## 2024-01-30 DIAGNOSIS — Z8269 Family history of other diseases of the musculoskeletal system and connective tissue: Secondary | ICD-10-CM | POA: Insufficient documentation

## 2024-01-30 DIAGNOSIS — Z832 Family history of diseases of the blood and blood-forming organs and certain disorders involving the immune mechanism: Secondary | ICD-10-CM | POA: Diagnosis not present

## 2024-01-30 DIAGNOSIS — Z8249 Family history of ischemic heart disease and other diseases of the circulatory system: Secondary | ICD-10-CM | POA: Insufficient documentation

## 2024-01-30 DIAGNOSIS — C8338 Diffuse large B-cell lymphoma, lymph nodes of multiple sites: Secondary | ICD-10-CM | POA: Diagnosis present

## 2024-01-30 DIAGNOSIS — Z809 Family history of malignant neoplasm, unspecified: Secondary | ICD-10-CM | POA: Diagnosis not present

## 2024-01-30 DIAGNOSIS — N390 Urinary tract infection, site not specified: Secondary | ICD-10-CM | POA: Diagnosis not present

## 2024-01-30 DIAGNOSIS — Z7982 Long term (current) use of aspirin: Secondary | ICD-10-CM | POA: Insufficient documentation

## 2024-01-30 LAB — CBC WITH DIFFERENTIAL (CANCER CENTER ONLY)
Abs Immature Granulocytes: 0.15 K/uL — ABNORMAL HIGH (ref 0.00–0.07)
Basophils Absolute: 0 K/uL (ref 0.0–0.1)
Basophils Relative: 1 %
Eosinophils Absolute: 0.1 K/uL (ref 0.0–0.5)
Eosinophils Relative: 4 %
HCT: 36.4 % — ABNORMAL LOW (ref 39.0–52.0)
Hemoglobin: 12.2 g/dL — ABNORMAL LOW (ref 13.0–17.0)
Immature Granulocytes: 7 %
Lymphocytes Relative: 46 %
Lymphs Abs: 1 K/uL (ref 0.7–4.0)
MCH: 31.5 pg (ref 26.0–34.0)
MCHC: 33.5 g/dL (ref 30.0–36.0)
MCV: 94.1 fL (ref 80.0–100.0)
Monocytes Absolute: 0.3 K/uL (ref 0.1–1.0)
Monocytes Relative: 12 %
Neutro Abs: 0.7 K/uL — ABNORMAL LOW (ref 1.7–7.7)
Neutrophils Relative %: 30 %
Platelet Count: 151 K/uL (ref 150–400)
RBC: 3.87 MIL/uL — ABNORMAL LOW (ref 4.22–5.81)
RDW: 13.6 % (ref 11.5–15.5)
WBC Count: 2.2 K/uL — ABNORMAL LOW (ref 4.0–10.5)
nRBC: 0 % (ref 0.0–0.2)

## 2024-01-30 LAB — CMP (CANCER CENTER ONLY)
ALT: 35 U/L (ref 0–44)
AST: 60 U/L — ABNORMAL HIGH (ref 15–41)
Albumin: 3.9 g/dL (ref 3.5–5.0)
Alkaline Phosphatase: 146 U/L — ABNORMAL HIGH (ref 38–126)
Anion gap: 9 (ref 5–15)
BUN: 9 mg/dL (ref 8–23)
CO2: 27 mmol/L (ref 22–32)
Calcium: 9.3 mg/dL (ref 8.9–10.3)
Chloride: 105 mmol/L (ref 98–111)
Creatinine: 0.75 mg/dL (ref 0.61–1.24)
GFR, Estimated: >60 mL/min (ref >60)
Glucose, Bld: 112 mg/dL — ABNORMAL HIGH (ref 70–99)
Potassium: 3.8 mmol/L (ref 3.5–5.1)
Sodium: 141 mmol/L (ref 135–145)
Total Bilirubin: 0.4 mg/dL (ref 0.0–1.2)
Total Protein: 6.2 g/dL — ABNORMAL LOW (ref 6.5–8.1)

## 2024-01-30 LAB — URINALYSIS, COMPLETE (UACMP) WITH MICROSCOPIC
Bilirubin Urine: NEGATIVE
Glucose, UA: NEGATIVE mg/dL
Hgb urine dipstick: NEGATIVE
Ketones, ur: NEGATIVE mg/dL
Nitrite: NEGATIVE
Protein, ur: 30 mg/dL — AB
RBC / HPF: >50 RBC/hpf (ref 0–5)
Specific Gravity, Urine: 1.016 (ref 1.005–1.030)
WBC, UA: >50 WBC/hpf (ref 0–5)
pH: 6 (ref 5.0–8.0)

## 2024-01-30 MED ORDER — PREDNISONE 10 MG PO TABS
ORAL_TABLET | ORAL | 0 refills | Status: DC
  Filled 2024-01-30: qty 16, 6d supply, fill #0

## 2024-01-30 MED ORDER — SULFAMETHOXAZOLE-TRIMETHOPRIM 800-160 MG PO TABS: 1.0000 | ORAL_TABLET | Freq: Two times a day (BID) | ORAL | 0 refills | Status: DC

## 2024-01-30 NOTE — Progress Notes (Signed)
 ANC=0.7. Dr Melanee informed. No treatment today, per Dr. Melanee.

## 2024-01-30 NOTE — Progress Notes (Signed)
 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  This was followed by a PET CT scan which showed extensive FDG avid adenopathy in the neck chest abdomen and pelvis.  FDG avid splenic lesions.  Solitary intramuscular FDG avid lesion in the right biceps femoris muscle.   Supraclavicular excisional lymph node biopsy showed high-grade B-cell lymphoma germinal center type Ki-67 greater than 95%.  FISH testing was positive for Bcl-2 BCL6 and MYC consistent with triple hit lymphoma.  By NCCN IPI score would be 4 based on age and elevated LDH and stage IV (intramuscular biceps femoris lesion) which puts him in the high intermediate risk group. 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   Patient received RCHOP for cycle 1 with plans for DA [facility] with cycle 2. IT MTX for CNS prophylaxis and he has received 3 cycles but declined further cycles.  MRI brain negative for lymphoma.  Chemotherapy was not being able to escalated upwards due to neutropenia which has persisted to 3 weeks and he has been receiving treatment every 4 weeks   PET CT scan after 3 cycles of  chemotherapy showedComplete or near complete metabolic response.  Residual disease in the anterior upper right mediastinum slightly greater than background mediastinal activity   Patient found to have a clinically palpable right supraclavicular lymph node which was subsequently biopsied and was consistent with previously diagnosed lymphoma.  PET CT scan showedNew right level 5 adenopathy in the lower neck with dominant lymph node 1.6 cm Deauville 5.  No other findings of acute active lymphoma in chest abdomen pelvis and bones.  MRI brain negative for leptomeningeal disease.   Patient seen for second opinion by Dr. Gaylan at [facility] for consideration of CAR-T cell therapy.  Patient did not wish to proceed with CAR-T cell treatment after reviewing risks versus benefits.  Moreover patient's health insurance will also not cover CAR-T cell therapy. Patient started on second line tafasitamab /Revlimid  regimen in June 2023.  He is on Revlimid  15 mg 3 weeks on and 1 week off.  Disease progression after 3 cycles and patient was switched to [facility] regimen     Patient was noted to haveSignificant decrease in the size of supraclavicular lymph nodes consistent with treatment response Deauville 3 after 3 cycles of Pola BR chemotherapy.  Patient has only received 4 cycles of chemotherapy so far due to persistent neutropenia despite receiving growth factor support.  Repeat PET scan showed new/recurrent hypermetabolic subcutaneous tissue in the high right back/inferior neck consistent with recurrent lymphoma Deauville 5.  He received palliative radiation to that area.  Patient was started on Zynlonta  in June 2024 after he was found to have recurrent disease involving subcutaneous right scapular region.  Patient desired treatment break and therefore treatment was held again in November 2024 when PET scan showed complete resolution of disease.  Treatment was restarted in March 2025 when he had recurrent disease noted on PET scan     Interval history- Discussed the use of AI scribe software for clinical note transcription with the patient, who gave verbal consent to proceed.  History of Present Illness   Christopher Mills is a 63 year old male with lymphoma who presents for ongoing treatment with Zynlonta .  He is currently undergoing treatment with Zynlonta  for his lymphoma and has completed twenty treatments.  He reports a recent episode of swelling in the abdominal area, for which he consulted his primary care physician. He suspected a urinary tract infection and was prescribed antibiotics. Despite treatment, he continues to experience burning during urination. A urine culture was negative for infection.  He also reports issues with his eyes, particularly sensitivity to sunlight and excessive tearing. An eye examination revealed 20/20 vision, mild cataracts not requiring surgery, and dry eyes with possible blocked tear ducts. He experiences tearing, especially in the mornings.       ECOG PS- 0 Pain scale- 0   Review of systems- Review of Systems  Constitutional:  Negative for chills, fever, malaise/fatigue and weight loss.  HENT:  Negative for congestion, ear discharge and nosebleeds.   Eyes:  Negative for blurred vision.  Respiratory:  Negative for cough, hemoptysis, sputum production, shortness of breath and wheezing.   Cardiovascular:  Negative for chest pain, palpitations, orthopnea and claudication.  Gastrointestinal:  Negative for abdominal pain, blood in stool, constipation, diarrhea, heartburn, melena, nausea and vomiting.  Genitourinary:  Negative for dysuria, flank pain, frequency, hematuria and urgency.  Musculoskeletal:  Negative for back pain, joint pain and myalgias.  Skin:  Negative for rash.  Neurological:  Negative for dizziness, tingling, focal weakness, seizures, weakness and headaches.  Endo/Heme/Allergies:  Does not bruise/bleed easily.  Psychiatric/Behavioral:  Negative for  depression and suicidal ideas. The patient does not have insomnia.       No Known Allergies   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   Salmonella gastroenteritis      Past Surgical History:  Procedure Laterality Date   BONE MARROW BIOPSY     COLONOSCOPY     EXCISION MASS NECK Right 06/12/2020   Procedure: EXCISION MASS NECK;  Surgeon: [name], MD;  Location: [facility] ORS;  Service: General;  Laterality: Right;   HERNIA REPAIR Right    at age 54-RIH   76 CATH INSERTION     PORTACATH PLACEMENT Right 06/24/2020   Procedure: INSERTION PORT-A-CATH;  Surgeon: [name], MD;  Location: [facility] ORS;  Service: General;  Laterality: Right;    Social History   Socioeconomic History   Marital status: Single    Spouse name: Not on file   Number of children: Not on file   Years of education: Not on file   Highest education level: Not on file  Occupational History   Not on file  Tobacco Use   Smoking status: Former    Current packs/day: 0.00    Types: Cigarettes    Quit date: 06/13/2020    Years since quitting: 3.6   Smokeless tobacco: Never  Vaping Use   Vaping status: Never Used  Substance and Sexual  Activity   Alcohol use: Not Currently   Drug use: Not Currently    Types: Marijuana   Sexual activity: Not Currently  Other Topics Concern   Not on file  Social History Narrative   Has daughter and grandson in the home   Social Drivers of Health   Financial Resource Strain: Medium Risk (07/30/2021)   Received from [facility]   Overall Financial Resource Strain (CARDIA)    Difficulty of Paying Living Expenses: Somewhat hard  Food Insecurity: No Food Insecurity (07/30/2021)   Received from [facility]   Hunger Vital Sign    Within the past 12 months, you worried that your food would run out before you got the money to buy  more.: Never true    Within the past 12 months, the food you bought just didn't last and you didn't have money to get more.: Never true  Transportation Needs: No Transportation Needs (07/30/2021)   Received from [facility]   PRAPARE - Transportation    Lack of Transportation (Medical): No    Lack of Transportation (Non-Medical): No  Physical Activity: Not on file  Stress: No Stress Concern Present (07/30/2021)   Received from [facility] of Occupational Health - Occupational Stress Questionnaire    Feeling of Stress : Not at all  Social Connections: Not on file  Intimate Partner Violence: Not on file    Family History  Problem Relation Age of Onset   Anemia Mother    Hypertension Mother    Goiter Mother    Cancer Father    Cancer Sister    Multiple sclerosis Sister      Current Outpatient Medications:    aspirin  EC 81 MG tablet, Take 1 tablet (81 mg total) by mouth daily. Swallow whole. (Patient not taking: Reported on 01/17/2024), Disp: 30 tablet, Rfl: 0   atenolol (TENORMIN) 25 MG tablet, Take 1 tablet (25 mg total) by mouth daily., Disp: 90 tablet, Rfl: 1   cholecalciferol (VITAMIN D3) 25 MCG (1000 UNIT) tablet, Take 1,000 Units by mouth daily., Disp: , Rfl:    diphenhydrAMINE  (BENADRYL ) 2 % cream, Apply 1 Application topically 3 (three) times daily as needed for itching., Disp: 30 g, Rfl: 3   diphenhydrAMINE  (BENADRYL ) 25 mg capsule, Take 25 mg by mouth every 6 (six) hours as needed for itching or allergies., Disp: , Rfl:    lisinopril (ZESTRIL) 10 MG tablet, Take 1 tablet (10 mg total) by mouth daily., Disp: 90 tablet, Rfl: 1   Omega-3 Fatty Acids (FISH OIL ADULT GUMMIES PO), Take by mouth., Disp: , Rfl:    predniSONE  (DELTASONE ) 10 MG tablet, Take 5 tablets (50 mg) by mouth once daily on Day 1. Then take 4 tablets (40 mg) on Day 2, then take 3 tablets (30 mg) on Day 3, then take 2 tablets (20 mg) on Day 4, then take 1 tablet (10 mg) on Day 5, then  take 1/2 tablet (5 mg) on Day 6, then stop. (Patient taking differently: Take 10 mg by mouth as needed. Take 5 tablets (50 mg) by mouth once daily on Day 1. Then take 4 tablets (40 mg) on Day 2, then take 3 tablets (30 mg) on Day 3, then take 2 tablets (20 mg) on Day 4, then take 1 tablet (10 mg) on Day 5, then take 1/2 tablet (5 mg) on Day 6, then stop.), Disp: 16 tablet, Rfl: 0   Tenofovir  Alafenamide Fumarate (VEMLIDY ) 25  MG TABS, Take 1 tablet (25 mg total) by mouth daily. Take with food. (Patient not taking: Reported on 01/17/2024), Disp: 30 tablet, Rfl: 2   triamcinolone  ointment (KENALOG ) 0.5 %, Apply 1 Application topically 2 (two) times daily. (Patient not taking: Reported on 01/17/2024), Disp: 30 g, Rfl: 3   vitamin C (ASCORBIC ACID) 500 MG tablet, Take 500 mg by mouth daily., Disp: , Rfl:  No current facility-administered medications for this visit.  Facility-Administered Medications Ordered in Other Visits:    0.9 %  sodium chloride  infusion, , Intravenous, Continuous, Rennie, Govinda R, MD, Stopped at 09/11/20 1502   heparin  lock flush 100 unit/mL, 500 Units, Intravenous, Once, [name], MD   heparin  lock flush 100 unit/mL, 500 Units, Intravenous, Once, [name], MD   methotrexate  (PF) 12 mg in sodium chloride  (PF) 0.9 % INTRATHECAL chemo injection, , Intrathecal, Once, [name], MD   sodium chloride  flush (NS) 0.9 % injection 10 mL, 10 mL, Intracatheter, PRN, [name], MD, 10 mL at 01/18/23 1545   sodium chloride  flush (NS) 0.9 % injection 10 mL, 10 mL, Intravenous, Once, [name], MD  Physical exam:  Vitals:   01/30/24 0839  BP: (!) 145/81  Pulse: 71  Resp: 20  Temp: 97.8 F (36.6 C)  SpO2: 100%  Weight: 169 lb 1.6 oz (76.7 kg)   Physical Exam Cardiovascular:     Rate and Rhythm: Normal rate and regular rhythm.     Heart sounds: Normal heart sounds.  Pulmonary:     Effort: Pulmonary effort is normal.     Breath sounds: Normal breath  sounds.  Abdominal:     General: Bowel sounds are normal.     Palpations: Abdomen is soft.  Skin:    General: Skin is warm and dry.  Neurological:     Mental Status: He is alert and oriented to person, place, and time.      I have personally reviewed labs listed below:    Latest Ref Rng & Units 01/09/2024    8:16 AM  CMP  Glucose 70 - 99 mg/dL 90   BUN 8 - 23 mg/dL 11   Creatinine 9.38 - 1.24 mg/dL 9.08   Sodium 864 - 854 mmol/L 139   Potassium 3.5 - 5.1 mmol/L 3.8   Chloride 98 - 111 mmol/L 105   CO2 22 - 32 mmol/L 27   Calcium 8.9 - 10.3 mg/dL 9.3   Total Protein 6.5 - 8.1 g/dL 6.5   Total Bilirubin 0.0 - 1.2 mg/dL 0.8   Alkaline Phos 38 - 126 U/L 114   AST 15 - 41 U/L 54   ALT 0 - 44 U/L 27       Latest Ref Rng & Units 01/30/2024    8:26 AM  CBC  WBC 4.0 - 10.5 K/uL 2.2   Hemoglobin 13.0 - 17.0 g/dL 87.7   Hematocrit 60.9 - 52.0 % 36.4   Platelets 150 - 400 K/uL 151    I have personally reviewed Radiology images listed below: No images are attached to the encounter.  NM PET Image Restag (PS) Skull Base To Thigh Result Date: 01/03/2024 EXAM: PET AND CT SKULL BASE TO MID THIGH 01/02/2024 09:27:16 AM TECHNIQUE: RADIOPHARMACEUTICAL: 9.4 mCi F-18 FDG Uptake time 60 minutes. Glucose level 82 mg/dl. PET imaging was acquired from the base of the skull to the mid thighs. Non-contrast enhanced computed tomography was obtained for attenuation correction and anatomic localization. COMPARISON: FDG PET  scan 09/06/2023. CLINICAL HISTORY: High grade B-cell lymphoma (HCC). 9.4 mCi F18 FDG Given.; Blood Glucose: 82 mg/dl; B-cell lymphoma; NPO; Not diabetic; Not currently on antibiotics/steroids; No recent surgery/biopsy; Last immuno therapy dose 3 weeks ago. FINDINGS: HEAD AND NECK: No metabolically active cervical lymphadenopathy. CHEST: Decrease in metabolic activity of 2 subcutaneous nodules over the right deltoid muscle. For example, the more superior nodule measuring 12 mm (image  40) has not changed in size but decreased in metabolic activity with SUV max equal to 2.7 (decrease from SUV max equal to 6.7). The more inferior subcutaneous nodule (image 47) has SUV max equal to 7.8 compared to SUV max equal to 8.4. Solitary lesion within the adjacent proximal humerus is decreased in metabolic activity with SUV max equal to 1.9 compared to SUV max equal to 9.2. There are no new signs of hypermetabolic activity. Subpleural nodularity in the right upper lobe on image 50 does not have significant metabolic activity. No metabolically active pulmonary nodules. No metabolically active lymphadenopathy. ABDOMEN AND PELVIS: Normal liver activity equals 2.9 and mediastinal blood pool activity equals 1.8. No metabolically active intraperitoneal mass. No metabolically active lymphadenopathy. Physiologic activity within the gastrointestinal and genitourinary systems. BONES AND SOFT TISSUE: There is uniform increase in marrow activity consistent with a GCSF type response. No abnormal FDG activity localizes to the bones. No metabolically active aggressive osseous lesion. IMPRESSION: 1. Interval decrease in metabolic activity of two subcutaneous nodules over the right deltoid and a solitary lesion within the adjacent proximal humerus, without change in size. 2. One of the two subcutaneous nodules in the right shoulder does have metabolic activity significantly greater than liver activity. 3. No new hypermetabolic lesions identified. Electronically signed by: Norleen Boxer MD 01/03/2024 04:46 PM EDT RP Workstation: [id number]     Assessment and plan- Patient is a 63 y.o. 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 Disease progression occurred when treatment paused. PET  scan shows activity in right deltoid and shoulder. Reports eye sensitivity to sunlight, not linked to Zynlonta . Agreed to continue treatment with modified schedule if side effects arise. - Continue Zynlonta  every four weeks instead of every 3 weeks as per patient preference.  Treatment will need to be held today since neutrophil count is less than 1. -I am deferring treatment by 2 weeks. - Plan PET scan in February 2026.  Dysuria under evaluation Ongoing dysuria despite previous antibiotic treatment. Negative urine culture suggests non-UTI cause. Experiences burning sensation during urination. - Checked urine sample today for UTI which was positive and Bactrim  prescription will be sent to the patient for 5 days. - If negative, consider Pyridium for burning sensation.     Chemo-induced neutropenia: Patient last received Zynlonta  on 12/19/2023 with Neulasta  support.  White cell count is still 2.2 today with ANC of 0.7 today.  Chemotherapy will be held today    Visit Diagnosis 1. Encounter for antineoplastic chemotherapy   2. High grade B-cell lymphoma (HCC)   3. Chemotherapy induced neutropenia      Dr. Annah Skene, MD, MPH [facility] at [facility] [phone] 01/30/2024 8:29 AM

## 2024-01-30 NOTE — Telephone Encounter (Signed)
 Per Dr. Melanee Please send him a prescription for Bactrim  DS for 5 days for possible UTI. His UA appears to be positive.  Outbound call to patient; informed of above.  Patient requested script to be sent to [facility] on [address] Script sent as requested.

## 2024-02-01 ENCOUNTER — Inpatient Hospital Stay

## 2024-02-07 ENCOUNTER — Inpatient Hospital Stay (HOSPITAL_BASED_OUTPATIENT_CLINIC_OR_DEPARTMENT_OTHER): Admitting: Nurse Practitioner

## 2024-02-07 ENCOUNTER — Telehealth: Payer: Self-pay

## 2024-02-07 ENCOUNTER — Encounter: Payer: Self-pay | Admitting: Nurse Practitioner

## 2024-02-07 ENCOUNTER — Inpatient Hospital Stay

## 2024-02-07 VITALS — BP 123/76 | HR 79 | Temp 97.8°F | Resp 18 | Ht 73.0 in | Wt 165.5 lb

## 2024-02-07 DIAGNOSIS — R339 Retention of urine, unspecified: Secondary | ICD-10-CM | POA: Diagnosis not present

## 2024-02-07 DIAGNOSIS — C8338 Diffuse large B-cell lymphoma, lymph nodes of multiple sites: Secondary | ICD-10-CM | POA: Diagnosis not present

## 2024-02-07 DIAGNOSIS — R350 Frequency of micturition: Secondary | ICD-10-CM

## 2024-02-07 DIAGNOSIS — N35919 Unspecified urethral stricture, male, unspecified site: Secondary | ICD-10-CM

## 2024-02-07 LAB — URINALYSIS, COMPLETE (UACMP) WITH MICROSCOPIC
Bilirubin Urine: NEGATIVE
Glucose, UA: NEGATIVE mg/dL
Ketones, ur: NEGATIVE mg/dL
Nitrite: NEGATIVE
Protein, ur: NEGATIVE mg/dL
RBC / HPF: >50 RBC/hpf (ref 0–5)
Specific Gravity, Urine: 1.015 (ref 1.005–1.030)
Squamous Epithelial / HPF: 0 /HPF (ref 0–5)
WBC, UA: >50 WBC/hpf (ref 0–5)
pH: 6 (ref 5.0–8.0)

## 2024-02-07 NOTE — Progress Notes (Signed)
 Symptom Management Clinic  [facility] at [facility] A Department of the [place]. [facility] [address] [place], [place] [address] [phone] (phone) [phone] (fax)  Patient Care Team: Pcp, No as PCP - General [name], MD as Consulting Physician (Oncology) [name], MD as Consulting Physician (General Surgery)   Name of the patient: Christopher Mills  [id number]  [birth year]   Date of visit: 02/07/24  Diagnosis-high-grade B-cell lymphoma  Chief complaint/ Reason for visit-incomplete bladder emptying  Heme/Onc history:  Oncology History  High grade B-cell lymphoma (HCC)  06/18/2020 Initial Diagnosis   High grade B-cell lymphoma (HCC)   06/18/2020 Cancer Staging   Staging form: Hodgkin and Non-Hodgkin Lymphoma, AJCC 8th Edition - Clinical stage from 06/18/2020: Stage IV (Diffuse large B-cell lymphoma) - Signed by [name], MD on 06/18/2020 Stage prefix: Initial diagnosis   06/21/2020 - 06/21/2020 Chemotherapy         07/03/2020 - 07/08/2020 Chemotherapy         08/12/2020 - 12/05/2020 Chemotherapy   Patient is on Treatment Plan :  NON-HODGKINS LYMPHOMA R-EPOCH q21d     08/17/2021 - 10/12/2021 Chemotherapy   Patient is on Treatment Plan : NON-HODGKIN'S LYMPHOMA DLBCL RELAPSED/REFRACTORY Tafasitamab -cxix + Lenalidomide  q28d / Tafasitamab -cxix Maintenance q28d     10/29/2021 - 11/02/2021 Chemotherapy   Patient is on Treatment Plan : NON-HODGKINS LYMPHOMA RELAPSED/ REFRACTORY DLBCL Polatuzumab + Bendamustine  + Rituximab  q21d x 6 cycles     11/12/2021 - 11/12/2021 Chemotherapy   Patient is on Treatment Plan : NON-HODGKINS LYMPHOMA RELAPSED/ REFRACTORY DLBCL Polatuzumab D1 + Bendamustine  D1,2 + Rituximab  D1 q21d x 6 cycles     11/18/2021 - 01/01/2022 Chemotherapy   Patient is on Treatment Plan : NON-HODGKINS LYMPHOMA RELAPSED/ REFRACTORY DLBCL Polatuzumab D1 + Bendamustine  D1,2 + Rituximab  D1 q21d x 6 cycles     08/24/2022 -   Chemotherapy   Patient is on Treatment Plan : LYMPHOMA Loncastuximab tesirine  D1 q21d       Interval history- Christopher Mills is a 63 y.o. male with the above history of diffuse large B-cell lymphoma currently receiving loncastuximab tersirine/zynlonta , last on 12/19/23, who presents to symptom management clinic for complaints of incomplete bladder emptying.  For the past 2 weeks he has felt like he is not able to fully void and has burning when he urinates.  He was treated for urinary tract infection by his primary care doctor but symptoms returned a few days later.  He has now noticed that it appears that the urinary meatus is closing.  No fevers or chills.  He feels pressure in his pelvis is nonpainful.  He noticed some blood in his urine today.  Feels like he has to push the urine out to get it to flow.  Review of systems- Review of Systems  Constitutional:  Negative for chills, fever and malaise/fatigue.  Gastrointestinal:  Positive for abdominal pain. Negative for blood in stool, constipation, diarrhea and melena.  Genitourinary:  Positive for dysuria and hematuria. Negative for flank pain and frequency.  Neurological:  Negative for sensory change, loss of consciousness and weakness.     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   Salmonella gastroenteritis     Past Surgical History:  Procedure Laterality Date   BONE MARROW  BIOPSY     COLONOSCOPY     EXCISION MASS NECK Right 06/12/2020   Procedure: EXCISION MASS NECK;  Surgeon: [name], MD;  Location: [facility] ORS;  Service: General;  Laterality: Right;   HERNIA REPAIR Right    at age 83-RIH   8 CATH INSERTION     PORTACATH PLACEMENT Right 06/24/2020   Procedure: INSERTION PORT-A-CATH;  Surgeon: [name], MD;  Location: [facility] ORS;  Service: General;  Laterality:  Right;    Social History   Socioeconomic History   Marital status: Single    Spouse name: Not on file   Number of children: Not on file   Years of education: Not on file   Highest education level: Not on file  Occupational History   Not on file  Tobacco Use   Smoking status: Former    Current packs/day: 0.00    Types: Cigarettes    Quit date: 06/13/2020    Years since quitting: 3.6   Smokeless tobacco: Never  Vaping Use   Vaping status: Never Used  Substance and Sexual Activity   Alcohol use: Not Currently   Drug use: Not Currently    Types: Marijuana   Sexual activity: Not Currently  Other Topics Concern   Not on file  Social History Narrative   Has daughter and grandson in the home   Social Drivers of Health   Financial Resource Strain: Medium Risk (07/30/2021)   Received from [facility]   Overall Financial Resource Strain (CARDIA)    Difficulty of Paying Living Expenses: Somewhat hard  Food Insecurity: No Food Insecurity (07/30/2021)   Received from [facility]   Hunger Vital Sign    Within the past 12 months, you worried that your food would run out before you got the money to buy more.: Never true    Within the past 12 months, the food you bought just didn't last and you didn't have money to get more.: Never true  Transportation Needs: No Transportation Needs (07/30/2021)   [facility] from [facility]   PRAPARE - Transportation    Lack of Transportation (Medical): No    Lack of Transportation (Non-Medical): No  Physical Activity: Not on file  Stress: No Stress Concern Present (07/30/2021)   Received from [facility] - Occupational Stress Questionnaire    Feeling of Stress : Not at all  Social Connections: Not on file  Intimate Partner Violence: Not on file   Family History  Problem Relation Age of Onset   Anemia Mother    Hypertension Mother    Goiter Mother    Cancer Father    Cancer Sister     Multiple sclerosis Sister     Current Outpatient Medications:    atenolol  (TENORMIN ) 25 MG tablet, Take 1 tablet (25 mg total) by mouth daily., Disp: 90 tablet, Rfl: 1   cholecalciferol (VITAMIN D3) 25 MCG (1000 UNIT) tablet, Take 1,000 Units by mouth daily., Disp: , Rfl:    lisinopril  (ZESTRIL ) 10 MG tablet, Take 1 tablet (10 mg total) by mouth daily., Disp: 90 tablet, Rfl: 1   Omega-3 Fatty Acids (FISH OIL ADULT GUMMIES PO), Take by mouth., Disp: , Rfl:    predniSONE  (DELTASONE ) 10 MG tablet, Take 5 tablets (50 mg) by mouth once daily on Day 1. Then take 4 tablets (40 mg) on Day 2, then take 3 tablets (30 mg) on Day 3, then take 2 tablets (20  mg) on Day 4, then take 1 tablet (10 mg) on Day 5, then take 1/2 tablet (5 mg) on Day 6, then stop., Disp: 16 tablet, Rfl: 0   sulfamethoxazole -trimethoprim  (BACTRIM  DS) 800-160 MG tablet, Take 1 tablet by mouth 2 (two) times daily., Disp: 10 tablet, Rfl: 0   vitamin C (ASCORBIC ACID) 500 MG tablet, Take 500 mg by mouth daily., Disp: , Rfl:    aspirin  EC 81 MG tablet, Take 1 tablet (81 mg total) by mouth daily. Swallow whole. (Patient not taking: Reported on 01/30/2024), Disp: 30 tablet, Rfl: 0   diphenhydrAMINE  (BENADRYL ) 2 % cream, Apply 1 Application topically 3 (three) times daily as needed for itching. (Patient not taking: Reported on 01/30/2024), Disp: 30 g, Rfl: 3   diphenhydrAMINE  (BENADRYL ) 25 mg capsule, Take 25 mg by mouth every 6 (six) hours as needed for itching or allergies. (Patient not taking: Reported on 01/30/2024), Disp: , Rfl:    Tenofovir  Alafenamide Fumarate (VEMLIDY ) 25 MG TABS, Take 1 tablet (25 mg total) by mouth daily. Take with food. (Patient not taking: Reported on 01/30/2024), Disp: 30 tablet, Rfl: 2   triamcinolone  ointment (KENALOG ) 0.5 %, Apply 1 Application topically 2 (two) times daily. (Patient not taking: Reported on 01/30/2024), Disp: 30 g, Rfl: 3 No current facility-administered medications for this  visit.  Facility-Administered Medications Ordered in Other Visits:    0.9 %  sodium chloride  infusion, , Intravenous, Continuous, Brahmanday, Govinda R, MD, Stopped at 09/11/20 1502   heparin  lock flush 100 unit/mL, 500 Units, Intravenous, Once, [name], MD   heparin  lock flush 100 unit/mL, 500 Units, Intravenous, Once, [name], MD   methotrexate  (PF) 12 mg in sodium chloride  (PF) 0.9 % INTRATHECAL chemo injection, , Intrathecal, Once, [name], MD   sodium chloride  flush (NS) 0.9 % injection 10 mL, 10 mL, Intracatheter, PRN, [name], MD, 10 mL at 01/18/23 1545   sodium chloride  flush (NS) 0.9 % injection 10 mL, 10 mL, Intravenous, Once, [name], MD  Physical exam:  Vitals:   02/07/24 0945  BP: 123/76  Pulse: 79  Resp: 18  Temp: 97.8 F (36.6 C)  TempSrc: Tympanic  SpO2: 100%  Weight: 165 lb 8 oz (75.1 kg)  Height: 6' 1 (1.854 m)   Physical Exam Vitals reviewed.  Constitutional:      Appearance: He is not ill-appearing.  Pulmonary:     Effort: Pulmonary effort is normal.  Abdominal:     General: There is no distension.  Genitourinary:    Comments: deferred Neurological:     Mental Status: He is alert and oriented to person, place, and time.  Psychiatric:        Mood and Affect: Mood normal.        Behavior: Behavior normal.         Latest Ref Rng & Units 01/30/2024    8:26 AM  CMP  Glucose 70 - 99 mg/dL 887   BUN 8 - 23 mg/dL 9   Creatinine 9.38 - 8.75 mg/dL 9.24   Sodium 864 - 854 mmol/L 141   Potassium 3.5 - 5.1 mmol/L 3.8   Chloride 98 - 111 mmol/L 105   CO2 22 - 32 mmol/L 27   Calcium 8.9 - 10.3 mg/dL 9.3   Total Protein 6.5 - 8.1 g/dL 6.2   Total Bilirubin 0.0 - 1.2 mg/dL 0.4   Alkaline Phos 38 - 126 U/L 146   AST 15 - 41 U/L 60  ALT 0 - 44 U/L 35       Latest Ref Rng & Units 01/30/2024    8:26 AM  CBC  WBC 4.0 - 10.5 K/uL 2.2   Hemoglobin 13.0 - 17.0 g/dL 87.7   Hematocrit 60.9 - 52.0 % 36.4   Platelets 150 -  400 K/uL 151    Urinalysis    Component Value Date/Time   COLORURINE YELLOW (A) 02/07/2024 0936   APPEARANCEUR HAZY (A) 02/07/2024 0936   LABSPEC 1.015 02/07/2024 0936   PHURINE 6.0 02/07/2024 0936   GLUCOSEU NEGATIVE 02/07/2024 0936   HGBUR MODERATE (A) 02/07/2024 0936   BILIRUBINUR NEGATIVE 02/07/2024 0936   BILIRUBINUR Negative 01/17/2024 1342   KETONESUR NEGATIVE 02/07/2024 0936   PROTEINUR NEGATIVE 02/07/2024 0936   UROBILINOGEN 0.2 01/17/2024 1342   NITRITE NEGATIVE 02/07/2024 0936   LEUKOCYTESUR LARGE (A) 02/07/2024 0936   Results for orders placed or performed in visit on 01/17/24  Urine Culture     Status: None   Collection Time: 01/17/24  1:54 PM   Specimen: Urine   UR  Result Value Ref Range Status   Urine Culture, Routine Final report  Final   Organism ID, Bacteria No growth  Final   No results found.  Assessment and plan- Patient is a 63 y.o. 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  Question meatal stricture.  He is uncircumcised. I have reached out to urology who recommends urgent evaluation.  We discussed that treatment may include dilation or meatotomy as well as treatment of underlying conditions that may have contributed to his stricture.  I discussed ER precautions if he becomes unable to void. UTI- treated with bactrim  x 2 per pcp and med-onc. Urine culture today is pending. We discussed that infections can be secondary to incomplete bladder emptying.  Disposition:  Urgent urology referral Follow up as scheduled with med-onc- [place]  Visit Diagnosis 1. 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 He  also understands that He can call clinic at any time with any questions, concerns, or complaints.   Thank you for allowing me to participate in the care of this very pleasant patient.   Tinnie Dawn, DNP, AGNP-C, AOCNP Cancer Center at [facility] [phone]

## 2024-02-07 NOTE — Telephone Encounter (Signed)
 Voicemail received from patient yesterday 02/06/24 at 2:14PM requesting a call back at [phone]; no additional information left.  Outbound call; spoke to patient who completed 5 day course of bactrim  but states still having issues where he pees its closed up.  Touched base with Morna and said to offer [facility] appointment to patient and touch base w/ Lauren / Sidra.  Patient agreed to see Lauren at 9:30am today.

## 2024-02-08 ENCOUNTER — Telehealth: Payer: Self-pay

## 2024-02-08 LAB — URINE CULTURE: Culture: NO GROWTH

## 2024-02-08 NOTE — Telephone Encounter (Signed)
 Outbound call to check on status of referral sent 12/19/23 for photophobia.  Representative indicated patient was last seen 12/20/23.

## 2024-02-13 ENCOUNTER — Other Ambulatory Visit: Payer: Self-pay

## 2024-02-13 ENCOUNTER — Inpatient Hospital Stay: Attending: Oncology

## 2024-02-13 ENCOUNTER — Inpatient Hospital Stay

## 2024-02-13 VITALS — BP 124/75 | HR 78 | Temp 97.7°F | Resp 18 | Ht 73.0 in | Wt 168.4 lb

## 2024-02-13 DIAGNOSIS — N35919 Unspecified urethral stricture, male, unspecified site: Secondary | ICD-10-CM

## 2024-02-13 DIAGNOSIS — C8510 Unspecified B-cell lymphoma, unspecified site: Secondary | ICD-10-CM

## 2024-02-13 DIAGNOSIS — Z79899 Other long term (current) drug therapy: Secondary | ICD-10-CM | POA: Insufficient documentation

## 2024-02-13 DIAGNOSIS — C8338 Diffuse large B-cell lymphoma, lymph nodes of multiple sites: Secondary | ICD-10-CM | POA: Diagnosis present

## 2024-02-13 LAB — CBC WITH DIFFERENTIAL (CANCER CENTER ONLY)
Abs Immature Granulocytes: 0.15 K/uL — ABNORMAL HIGH (ref 0.00–0.07)
Basophils Absolute: 0 K/uL (ref 0.0–0.1)
Basophils Relative: 1 %
Eosinophils Absolute: 0.1 K/uL (ref 0.0–0.5)
Eosinophils Relative: 3 %
HCT: 35.2 % — ABNORMAL LOW (ref 39.0–52.0)
Hemoglobin: 12 g/dL — ABNORMAL LOW (ref 13.0–17.0)
Immature Granulocytes: 4 %
Lymphocytes Relative: 43 %
Lymphs Abs: 1.5 K/uL (ref 0.7–4.0)
MCH: 31.3 pg (ref 26.0–34.0)
MCHC: 34.1 g/dL (ref 30.0–36.0)
MCV: 91.9 fL (ref 80.0–100.0)
Monocytes Absolute: 0.3 K/uL (ref 0.1–1.0)
Monocytes Relative: 10 %
Neutro Abs: 1.4 K/uL — ABNORMAL LOW (ref 1.7–7.7)
Neutrophils Relative %: 39 %
Platelet Count: 169 K/uL (ref 150–400)
RBC: 3.83 MIL/uL — ABNORMAL LOW (ref 4.22–5.81)
RDW: 13.5 % (ref 11.5–15.5)
Smear Review: NORMAL
WBC Count: 3.6 K/uL — ABNORMAL LOW (ref 4.0–10.5)
nRBC: 0 % (ref 0.0–0.2)

## 2024-02-13 LAB — CMP (CANCER CENTER ONLY)
ALT: 28 U/L (ref 0–44)
AST: 42 U/L — ABNORMAL HIGH (ref 15–41)
Albumin: 4.1 g/dL (ref 3.5–5.0)
Alkaline Phosphatase: 127 U/L — ABNORMAL HIGH (ref 38–126)
Anion gap: 10 (ref 5–15)
BUN: 10 mg/dL (ref 8–23)
CO2: 25 mmol/L (ref 22–32)
Calcium: 10.1 mg/dL (ref 8.9–10.3)
Chloride: 103 mmol/L (ref 98–111)
Creatinine: 0.85 mg/dL (ref 0.61–1.24)
GFR, Estimated: >60 mL/min (ref >60)
Glucose, Bld: 92 mg/dL (ref 70–99)
Potassium: 4.2 mmol/L (ref 3.5–5.1)
Sodium: 138 mmol/L (ref 135–145)
Total Bilirubin: 0.6 mg/dL (ref 0.0–1.2)
Total Protein: 6.9 g/dL (ref 6.5–8.1)

## 2024-02-13 MED ORDER — DEXTROSE 5 % IV SOLN
Freq: Once | INTRAVENOUS | Status: AC
  Filled 2024-02-13: qty 250

## 2024-02-13 MED ORDER — LONCASTUXIMAB TESIRINE-LPYL CHEMO INJECTION 10 MG
0.0750 mg/kg | Freq: Once | INTRAVENOUS | Status: AC
  Administered 2024-02-13: 6.1 mg via INTRAVENOUS
  Filled 2024-02-13: qty 1.22

## 2024-02-13 MED ORDER — PHENAZOPYRIDINE HCL 100 MG PO TABS
100.0000 mg | ORAL_TABLET | Freq: Three times a day (TID) | ORAL | 0 refills | Status: DC
  Filled 2024-02-13: qty 30, 10d supply, fill #0

## 2024-02-13 MED ORDER — DEXAMETHASONE SODIUM PHOSPHATE 10 MG/ML IJ SOLN
10.0000 mg | Freq: Once | INTRAMUSCULAR | Status: AC
  Administered 2024-02-13: 10 mg via INTRAVENOUS

## 2024-02-13 NOTE — Patient Instructions (Signed)
 [facility] - A DEPT OF [place]  Discharge Instructions: Thank you for choosing [facility] to provide your oncology and hematology care.  If you have a lab appointment with the Cancer Center, please go directly to the Cancer Center and check in at the registration area.  Wear comfortable clothing and clothing appropriate for easy access to any Portacath or PICC line.   We strive to give you quality time with your provider. You may need to reschedule your appointment if you arrive late (15 or more minutes).  Arriving late affects you and other patients whose appointments are after yours.  Also, if you miss three or more appointments without notifying the office, you may be dismissed from the clinic at the provider's discretion.      For prescription refill requests, have your pharmacy contact our office and allow 72 hours for refills to be completed.    Today you received the following chemotherapy and/or immunotherapy agents ZYNLOTA      To help prevent nausea and vomiting after your treatment, we encourage you to take your nausea medication as directed.  BELOW ARE SYMPTOMS THAT SHOULD BE REPORTED IMMEDIATELY: *FEVER GREATER THAN 100.4 F (38 C) OR HIGHER *CHILLS OR SWEATING *NAUSEA AND VOMITING THAT IS NOT CONTROLLED WITH YOUR NAUSEA MEDICATION *UNUSUAL SHORTNESS OF BREATH *UNUSUAL BRUISING OR BLEEDING *URINARY PROBLEMS (pain or burning when urinating, or frequent urination) *BOWEL PROBLEMS (unusual diarrhea, constipation, pain near the anus) TENDERNESS IN MOUTH AND THROAT WITH OR WITHOUT PRESENCE OF ULCERS (sore throat, sores in mouth, or a toothache) UNUSUAL RASH, SWELLING OR PAIN  UNUSUAL VAGINAL DISCHARGE OR ITCHING   Items with * indicate a potential emergency and should be followed up as soon as possible or go to the Emergency Department if any problems should occur.  Please show the CHEMOTHERAPY ALERT CARD or IMMUNOTHERAPY  ALERT CARD at check-in to the Emergency Department and triage nurse.  Should you have questions after your visit or need to cancel or reschedule your appointment, please contact [facility] - A DEPT OF [facility]  [phone] and follow the prompts.  Office hours are 8:00 a.m. to 4:30 p.m. Monday - Friday. Please note that voicemails left after 4:00 p.m. may not be returned until the following business day.  We are closed weekends and major holidays. You have access to a nurse at all times for urgent questions. Please call the main number to the clinic [phone] and follow the prompts.  For any non-urgent questions, you may also contact your provider using MyChart. We now offer e-Visits for anyone 65 and older to request care online for non-urgent symptoms. For details visit [email or web address].   Also download the MyChart app! Go to the app store, search MyChart, open the app, select [place], and log in with your MyChart username and password.  Loncastuximab Tesirine  Injection What is this medication? LONCASTUXIMAB TESIRINE  (LON kas TUX i mab TES ir een) treats lymphoma. It works by blocking a protein that causes cancer cells to grow and multiply. This helps to slow or stop the spread of cancer cells. This medicine may be used for other purposes; ask your health care provider or pharmacist if you have questions. COMMON BRAND NAME(S): ZYNLONTA  What should I tell my care team before I take this medication? They need to know if you have any of these conditions: Infection Liver disease An unusual or allergic reaction  to loncastuximab tesirine , other medications, foods, dyes, or preservatives If you or your partner are pregnant or trying to get pregnant Breast-feeding How should I use this medication? This medication is injected into a vein. It is given by your care team in a hospital or clinic setting. Talk to your care team about the use of this  medication in children. Special care may be needed. Overdosage: If you think you have taken too much of this medicine contact a poison control center or emergency room at once. NOTE: This medicine is only for you. Do not share this medicine with others. What if I miss a dose? Keep appointments for follow-up doses. It is important not to miss your dose. Call your care team if you are unable to keep an appointment. What may interact with this medication? Interactions have not been studied. This list may not describe all possible interactions. Give your health care provider a list of all the medicines, herbs, non-prescription drugs, or dietary supplements you use. Also tell them if you smoke, drink alcohol, or use illegal drugs. Some items may interact with your medicine. What should I watch for while using this medication? Your condition will be monitored carefully while you are receiving this medication. You may need blood work while taking this medication. This medication can make you more sensitive to the sun. Keep out of the sun. If you cannot avoid being in the sun, wear protective clothing and sunscreen. Do not use sun lamps, tanning beds, or tanning booths. Talk to your care team if you or your partner wish to become pregnant or think either of you might be pregnant. This medication can cause serious birth defects if taken during pregnancy and for 10 months after the last dose. A negative pregnancy test is required before starting this medication. A reliable form of contraception is recommended while taking this medication and for 10 months after the last dose. Talk to your care team about effective forms of contraception. Do not father a child while taking this medication and for 7 months after the last dose. Use a condom while having sex during this time period. Do not breastfeed while taking this medication and for 3 months after the last dose. This medication may cause infertility. Talk to your  care team if you are concerned about your fertility. What side effects may I notice from receiving this medication? Side effects that you should report to your care team as soon as possible: Allergic reactions--skin rash, itching, hives, swelling of the face, lips, tongue, or throat Infection--fever, chills, cough, sore throat, wounds that don't heal, pain or trouble when passing urine, general feeling of discomfort or being unwell Low red blood cell level--unusual weakness or fatigue, dizziness, headache, trouble breathing Skin reactions on sun-exposed areas Swelling of the ankles, hands, or feet, shortness of breath or trouble breathing, sudden weight gain Unusual bruising or bleeding Side effects that usually do not require medical attention (report to your care team if they continue or are bothersome): Fatigue Muscle pain Nausea Skin rash This list may not describe all possible side effects. Call your doctor for medical advice about side effects. You may report side effects to FDA at 1-800-FDA-1088. Where should I keep my medication? This medication is given in a hospital or clinic. It will not be stored at home. NOTE: This sheet is a summary. It may not cover all possible information. If you have questions about this medicine, talk to your doctor, pharmacist, or health care provider.  2024 Elsevier/Gold Standard (2021-07-07 00:00:00)

## 2024-02-15 ENCOUNTER — Inpatient Hospital Stay

## 2024-02-15 DIAGNOSIS — C8510 Unspecified B-cell lymphoma, unspecified site: Secondary | ICD-10-CM

## 2024-02-15 DIAGNOSIS — C8338 Diffuse large B-cell lymphoma, lymph nodes of multiple sites: Secondary | ICD-10-CM | POA: Diagnosis not present

## 2024-02-15 MED ORDER — PEGFILGRASTIM-FPGK 6 MG/0.6ML ~~LOC~~ SOSY
6.0000 mg | PREFILLED_SYRINGE | Freq: Once | SUBCUTANEOUS | Status: AC
  Administered 2024-02-15: 6 mg via SUBCUTANEOUS
  Filled 2024-02-15: qty 0.6

## 2024-02-17 ENCOUNTER — Telehealth: Payer: Self-pay

## 2024-02-17 NOTE — Telephone Encounter (Signed)
 Incoming call; patient he's able to urinate a little bit, it stops and then he notices a little blood coming out. Denies abdominal pain / fever. He said he is able to come Monday at 2pm to your office. Per Ms. Vaillencourt ok to keep appointment for Monday, if he stops urinating or develops abdominal pain or fever over the weekend he should go to the ed.  Informed patient of above; patient verbalized understanding.

## 2024-02-17 NOTE — Telephone Encounter (Signed)
 Per Ms. Vaillencourt she can get his new patient appointment moved up assuming patient is urinating and not experiencing abdominal pain.

## 2024-02-17 NOTE — Telephone Encounter (Signed)
 Outbound call left voice message on general mailbox for urology.

## 2024-02-17 NOTE — Telephone Encounter (Signed)
 Appointment moved up from 12/11 to Monday 12/8 @ 2pm

## 2024-02-17 NOTE — Telephone Encounter (Signed)
 Incoming call from patient says medication is not helping and his UTI is getting worse.  Says hard for him to urinate and when he does urinate it doesn't come out and it burns.  Referral placed to urology 02/07/24 last week for Stricture of male urethra, unspecified stricture type & Incomplete bladder emptying.  Per Josh reach out to urology, we're unable to accommodate in clinic.

## 2024-02-20 ENCOUNTER — Ambulatory Visit: Admitting: Urology

## 2024-02-20 ENCOUNTER — Other Ambulatory Visit: Payer: Self-pay

## 2024-02-20 ENCOUNTER — Encounter: Payer: Self-pay | Admitting: Urology

## 2024-02-20 ENCOUNTER — Inpatient Hospital Stay

## 2024-02-20 ENCOUNTER — Inpatient Hospital Stay: Admitting: Oncology

## 2024-02-20 VITALS — BP 105/67 | HR 96 | Ht 73.0 in | Wt 167.0 lb

## 2024-02-20 DIAGNOSIS — R310 Gross hematuria: Secondary | ICD-10-CM

## 2024-02-20 DIAGNOSIS — R8281 Pyuria: Secondary | ICD-10-CM

## 2024-02-20 DIAGNOSIS — R399 Unspecified symptoms and signs involving the genitourinary system: Secondary | ICD-10-CM

## 2024-02-20 LAB — BLADDER SCAN AMB NON-IMAGING

## 2024-02-20 MED ORDER — CEFUROXIME AXETIL 250 MG PO TABS
250.0000 mg | ORAL_TABLET | Freq: Two times a day (BID) | ORAL | 0 refills | Status: DC
  Filled 2024-02-20 (×2): qty 14, 7d supply, fill #0

## 2024-02-20 NOTE — Progress Notes (Unsigned)
 02/20/2024 2:36 PM   [name] 1960-09-28 [id number]  Referring provider: Dasie Tinnie MATSU, NP [address] [place],  [place] [address]  Chief Complaint  Patient presents with   Urinary Frequency    HPI: Christopher Mills is a 63 y.o. male referred for evaluation of lower urinary tract symptoms and possible urethral stricture  Followed medical oncology for high-grade B-cell lymphoma At a recent oncology visit last month he was complaining of a 2-week history of sensation of incomplete emptying, dysuria and straining to urinate with a weak urinary stream.  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   Salmonella gastroenteritis     Surgical History: Past Surgical History:  Procedure Laterality Date   BONE MARROW BIOPSY     COLONOSCOPY     EXCISION MASS NECK Right 06/12/2020   Procedure: EXCISION MASS NECK;  Surgeon: [name], MD;  Location: [facility] ORS;  Service: General;  Laterality: Right;   HERNIA REPAIR Right    at age 73-RIH   PORTA CATH INSERTION     PORTACATH PLACEMENT Right 06/24/2020   Procedure: INSERTION PORT-A-CATH;  Surgeon: [name], MD;  Location: [facility] ORS;  Service: General;  Laterality: Right;    Home Medications:  Allergies as of 02/20/2024   No Known Allergies      Medication List        Accurate as of February 20, 2024  2:36 PM. If you have any questions, ask your nurse or doctor.          STOP taking these medications    aspirin  EC 81 MG tablet Stopped by: [name]   diphenhydrAMINE  2 % cream Commonly known as: BENADRYL  Stopped by: [name]   diphenhydrAMINE  25 mg capsule Commonly known as: BENADRYL  Stopped  by: [name]   predniSONE  10 MG tablet Commonly known as: DELTASONE  Stopped by: [name]   sulfamethoxazole -trimethoprim  800-160 MG tablet Commonly known as: BACTRIM  DS Stopped by: [name]   triamcinolone  ointment 0.5 % Commonly known as: KENALOG  Stopped by: [name]   Vemlidy  25 MG tablet Generic drug: tenofovir  alafenamide Stopped by: [name]       TAKE these medications    ascorbic acid 500 MG tablet Commonly known as: VITAMIN C Take 500 mg by mouth daily.   atenolol  25 MG tablet Commonly known as: TENORMIN  Take 1 tablet (25 mg total) by mouth daily.   cholecalciferol 25 MCG (1000 UNIT) tablet Commonly known as: VITAMIN D3 Take 1,000 Units by mouth daily.   FISH OIL ADULT GUMMIES PO Take by mouth.   lisinopril  10 MG tablet Commonly known as: ZESTRIL  Take 1 tablet (10 mg total) by mouth daily.   phenazopyridine  100 MG tablet Commonly known as: Pyridium  Take 1 tablet (100 mg total) by mouth 3 (three) times daily with meals.        Allergies: No Known Allergies  Family History: Family History  Problem Relation Age of Onset   Anemia Mother    Hypertension Mother    Goiter Mother    Cancer Father    Cancer Sister    Multiple sclerosis Sister  Social History:  reports that he quit smoking about 3 years ago. His smoking use included cigarettes. He has never used smokeless tobacco. He reports that he does not currently use alcohol. He reports that he does not currently use drugs after having used the following drugs: Marijuana.   Physical Exam: BP 105/67   Pulse 96   Ht 6' 1 (1.854 m)   Wt 167 lb (75.8 kg)   BMI 22.03 kg/m   Constitutional:  Alert, No acute distress. HEENT: [place] AT Respiratory: Normal respiratory effort, no increased work of breathing. GU: Phallus circumcised.  Glans normal in appearance.  Small urethral meatus with blood-tinged urine Psychiatric: Normal mood and affect.  Laboratory  Data:  Urinalysis Dipstick 3+ protein/3+ blood/2+ bili/nitrite positive/2+ leukocytes Microscopy >30 RBC/>30 WBC   Assessment & Plan:    1.  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    *** Message Eleanor Glendia JAYSON Twylla, MD  [facility] [address], [address] [place], [place] [address] [phone]

## 2024-02-21 ENCOUNTER — Encounter: Payer: Self-pay | Admitting: Urology

## 2024-02-21 ENCOUNTER — Other Ambulatory Visit: Payer: Self-pay

## 2024-02-21 ENCOUNTER — Telehealth: Payer: Self-pay

## 2024-02-21 DIAGNOSIS — R8281 Pyuria: Secondary | ICD-10-CM

## 2024-02-21 DIAGNOSIS — R310 Gross hematuria: Secondary | ICD-10-CM

## 2024-02-21 DIAGNOSIS — N368 Other specified disorders of urethra: Secondary | ICD-10-CM

## 2024-02-21 LAB — URINALYSIS, COMPLETE
Nitrite, UA: POSITIVE — AB
Specific Gravity, UA: 1.02 (ref 1.005–1.030)
Urobilinogen, Ur: 1 mg/dL (ref 0.2–1.0)
pH, UA: 6.5 (ref 5.0–7.5)

## 2024-02-21 LAB — MICROSCOPIC EXAMINATION
RBC, Urine: >30 /HPF — AB (ref 0–2)
WBC, UA: >30 /HPF — AB (ref 0–5)

## 2024-02-21 NOTE — Telephone Encounter (Signed)
 Per Dr. Twylla, Patient is to be scheduled for Cystoscopy with Possible Urethral Dilation, Possible Urethral Biopsy, Possible Bladder Biopsy and Possible Transurethral Resection of Bladder Tumor   Christopher Mills was contacted and possible surgical dates were discussed, Monday December 29th, 2025 was agreed upon for surgery.   Patient was instructed that Dr. Twylla will require them to provide a pre-op UA & CX prior to surgery. This was ordered and scheduled drop off appointment was made for 03/01/2024.    Patient was directed to call [phone] between 1-3pm the day before surgery to find out surgical arrival time.  Instructions were given not to eat or drink from midnight on the night before surgery and have a driver for the day of surgery. On the surgery day patient was instructed to enter through the Medical Mall entrance of [facility] report the Same Day Surgery desk.   Pre-Admit Testing will be in contact via phone to set up an interview with the anesthesia team to review your history and medications prior to surgery.   Reminder of this information was sent via MyChart to the patient.

## 2024-02-21 NOTE — Progress Notes (Signed)
 Surgical Physician Order Form [facility]  Dr. Glendia Barba, MD  * Scheduling expectation : Next Available  *Length of Case: 45 minutes  *Clearance needed: no  *Anticoagulation Instructions: N/A  *Aspirin  Instructions: N/A  *Post-op visit Date/Instructions: TBD  *Diagnosis: Hematuria/pyuria with obstructive voiding symptoms  *Procedure: Cystoscopy with possible urethral dilation; possible urethral biopsy; possible bladder biopsy/TURBT   Additional orders: N/A  -Admit type: OUTpatient  -Anesthesia: Choice  -VTE Prophylaxis Standing Order SCD's       Other:   -Standing Lab Orders Per Anesthesia    Lab other: Urine ID-[facility]   -Standing Test orders EKG/Chest x-ray per Anesthesia       Test other:   - Medications:  Ancef  2gm IV  -Other orders:  N/A

## 2024-02-21 NOTE — Progress Notes (Signed)
   [facility] Surgical Posting Form  Surgery Date: Date: 03/12/2024  Surgeon: Dr. Glendia Barba, MD  Inpt ( No  )   Outpt (Yes)   Obs ( No  )   Diagnosis: R31.0 Gross Hematuria, R82.81 Pyuria, N36.8 Urethral Obstruction  -CPT: 52000, 52281, 47795, 47765, 47775  Surgery: Cystoscopy with Possible Urethral Dilation, Possible Urethral Biopsy, Possible Bladder Biopsy and Possible Transurethral Resection of Bladder Tumor  Stop Anticoagulations: No  Cardiac/Medical/Pulmonary Clearance needed: no  *Orders entered into EPIC  Date: 02/21/24   *Case booked in EPIC  Date: 02/21/24  *Notified pt of Surgery: Date: 02/21/24  PRE-OP UA & CX: yes, will obtain in clinic on 03/01/2024- PCR [facility]  *Placed into Prior Authorization Work Que Date: 02/21/24  Assistant/laser/rep:No

## 2024-02-22 ENCOUNTER — Inpatient Hospital Stay

## 2024-02-22 ENCOUNTER — Other Ambulatory Visit: Payer: Medicaid Other

## 2024-02-23 ENCOUNTER — Ambulatory Visit: Admitting: Urology

## 2024-02-23 LAB — CULTURE, URINE COMPREHENSIVE

## 2024-02-25 ENCOUNTER — Other Ambulatory Visit: Payer: Self-pay

## 2024-02-25 ENCOUNTER — Emergency Department: Admission: EM | Admit: 2024-02-25 | Discharge: 2024-02-25 | Disposition: A | Discharge status: Home / Self Care

## 2024-02-25 DIAGNOSIS — N35914 Unspecified anterior urethral stricture, male: Secondary | ICD-10-CM | POA: Diagnosis not present

## 2024-02-25 DIAGNOSIS — R310 Gross hematuria: Secondary | ICD-10-CM

## 2024-02-25 DIAGNOSIS — N3091 Cystitis, unspecified with hematuria: Secondary | ICD-10-CM | POA: Insufficient documentation

## 2024-02-25 LAB — URINALYSIS, ROUTINE W REFLEX MICROSCOPIC
Bacteria, UA: NONE SEEN
RBC / HPF: >50 RBC/hpf (ref 0–5)
Squamous Epithelial / HPF: 0 /HPF (ref 0–5)
WBC, UA: >50 WBC/hpf (ref 0–5)

## 2024-02-25 LAB — CBC
HCT: 37.7 % — ABNORMAL LOW (ref 39.0–52.0)
Hemoglobin: 12.5 g/dL — ABNORMAL LOW (ref 13.0–17.0)
MCH: 30.9 pg (ref 26.0–34.0)
MCHC: 33.2 g/dL (ref 30.0–36.0)
MCV: 93.3 fL (ref 80.0–100.0)
Platelets: 153 K/uL (ref 150–400)
RBC: 4.04 MIL/uL — ABNORMAL LOW (ref 4.22–5.81)
RDW: 14.6 % (ref 11.5–15.5)
WBC: 7.9 K/uL (ref 4.0–10.5)
nRBC: 0 % (ref 0.0–0.2)

## 2024-02-25 LAB — BASIC METABOLIC PANEL WITH GFR
Anion gap: 12 (ref 5–15)
BUN: 20 mg/dL (ref 8–23)
CO2: 22 mmol/L (ref 22–32)
Calcium: 11.1 mg/dL — ABNORMAL HIGH (ref 8.9–10.3)
Chloride: 99 mmol/L (ref 98–111)
Creatinine, Ser: 1.44 mg/dL — ABNORMAL HIGH (ref 0.61–1.24)
GFR, Estimated: 54 mL/min — ABNORMAL LOW (ref >60)
Glucose, Bld: 95 mg/dL (ref 70–99)
Potassium: 4.8 mmol/L (ref 3.5–5.1)
Sodium: 133 mmol/L — ABNORMAL LOW (ref 135–145)

## 2024-02-25 MED ORDER — OXYCODONE-ACETAMINOPHEN 5-325 MG PO TABS
1.0000 | ORAL_TABLET | Freq: Once | ORAL | Status: AC
  Administered 2024-02-25: 1 via ORAL
  Filled 2024-02-25: qty 1

## 2024-02-25 MED ORDER — OXYBUTYNIN CHLORIDE ER 10 MG PO TB24
10.0000 mg | ORAL_TABLET | Freq: Every day | ORAL | 0 refills | Status: DC
  Filled 2024-02-25: qty 30, 30d supply, fill #0

## 2024-02-25 NOTE — ED Triage Notes (Signed)
 Pt to ED for dysuria, hematuria and pain to tip of urethra especially at the end of voiding. Has appt for surgery to urethra on 12/29 but states can't stand the pain. Denies hx BPH. States sometimes he cannot get all his urine out. States all of this is ongoing for 3 weeks and has been on abx course 3 times. Has port and is on tx for lymphoma and last chemo was 12/1.

## 2024-02-25 NOTE — Procedures (Signed)
° °  UROLOGY PROCEDURE NOTE  Indication: Dysuria, difficulty urinating  The patient was prepped and draped in standard sterile fashion.  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 Lidojet was instilled into the urethra, and a hemostat used to dilate the fossa navicularis stricture.  A 16 F catheter passed easily into the bladder with return of pink urine.  10 cc were placed in the balloon.  The catheter was irrigated with a Toomey syringe with 100 mL saline with return of 1 small clot but cleared rapidly to very faint pink.  The catheter was connected to dependent drainage, and the Foley was secured to the thigh.  Plan: -Okay for discharge tonight -Recommend oxybutynin  10 mg XL daily for bladder spasms -Will contact Dr. Twylla regarding long-term plan -Will coordinate clinic Foley removal next week  Christopher Burnet, MD 02/25/2024

## 2024-02-25 NOTE — Consult Note (Addendum)
 Urology Consult   I have been asked to see the patient by Dr. Rexford, for evaluation and management of dysuria, gross hematuria.  Chief Complaint: Dysuria, straining, gross hematuria  HPI:  Christopher Mills is a 63 y.o. with B-cell lymphoma treated with chemotherapy including R-CHOP and cyclophosphamide  who reports about 2 to 3 weeks of sensation of incomplete emptying, dysuria, gross hematuria, straining, weak stream.  He was seen by Dr. Twylla in clinic on 02/20/2024 and set up for cystoscopy, possible urethral dilation, possible biopsy in the OR on 12/29.  He presented to the ER tonight with worsening symptoms.  PVR normal at tonight.  He has been treated with multiple antibiotics, cultures have all been negative, no improvement on antibiotics previously.  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   Salmonella gastroenteritis     Surgical History: Past Surgical History:  Procedure Laterality Date   BONE MARROW BIOPSY     COLONOSCOPY     EXCISION MASS NECK Right 06/12/2020   Procedure: EXCISION MASS NECK;  Surgeon: [name], MD;  Location: [facility] ORS;  Service: General;  Laterality: Right;   HERNIA REPAIR Right    at age 22-RIH   PORTA CATH INSERTION     PORTACATH PLACEMENT Right 06/24/2020   Procedure: INSERTION PORT-A-CATH;  Surgeon: [name], MD;  Location: [facility] ORS;  Service: General;  Laterality: Right;     Allergies: Allergies[1]  Family History: Family History  Problem Relation Age of Onset   Anemia Mother    Hypertension Mother    Goiter Mother    Cancer Father    Cancer Sister    Multiple sclerosis Sister     Social History:  reports that he quit smoking about 3 years ago. His smoking use included cigarettes. He has never used smokeless tobacco. He reports that he does not currently use alcohol. He  reports that he does not currently use drugs after having used the following drugs: Marijuana.  ROS: Negative aside from those stated in the HPI.  Physical Exam: BP 106/62 (BP Location: Left Arm)   Pulse 89   Temp 98.5 F (36.9 C) (Oral)   Resp 20   Ht 6' 1 (1.854 m)   Wt 75.7 kg   SpO2 97%   BMI 22.02 kg/m    Constitutional:  Alert and oriented, No acute distress. Cardiovascular: No clubbing, cyanosis, or edema. Respiratory: Normal respiratory effort, no increased work of breathing. 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  See procedure note for urethral dilation and Foley placement details  Assessment & Plan:   63 year old male with B-cell lymphoma treated with chemotherapy including cyclophosphamide .  He reports 2 to 3 weeks of symptoms with dysuria, gross hematuria, weak stream, straining.  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  Urine was pink to light red but catheter irrigated easily with no significant clots.  We reviewed possible etiologies of his  gross hematuria including hemorrhagic cystitis from cyclophosphamide , stricture disease, BPH, or even malignancy.  Would still recommend cystoscopy at some point in the near future with his gross hematuria.  Will message Dr. Twylla and coordinate Foley removal next week.  Recommendations:  - Okay for discharge tonight - Recommend adding oxybutynin  10 mg XL daily for bladder spasms - Encouraged to push fluids, return precautions discussed - Will coordinate  Foley removal in the next few days in clinic - Will contact Dr. Twylla to decide if he would like to keep OR scheduled 12/29, or consider clinic cystoscopy instead  Christopher JAYSON Burnet, MD  Total time spent on the floor was 45 minutes, with greater than 50% spent in counseling and coordination of care with the patient regarding   [facility] [address] [place], [place] [address] [phone]      [1] No Known Allergies

## 2024-02-25 NOTE — ED Provider Notes (Signed)
 [facility] Provider Note    Event Date/Time   First MD Initiated Contact with Patient 02/25/24 1627     (approximate)   History   Dysuria, Hematuria, and penile pain (urethra)   HPI  Christopher Mills is a 63 y.o. male with a past medical history of B-cell lymphoma presenting to the emergency department complaining of blood in his urine and worsening pain from his urethra.  The patient reports that he is scheduled on 12/29 for a urethral dilation but that the pain has just been worsening and he cannot wait that long.     Physical Exam   Triage Vital Signs: ED Triage Vitals  Encounter Vitals Group     BP 02/25/24 1608 106/62     Girls Systolic BP Percentile --      Girls Diastolic BP Percentile --      Boys Systolic BP Percentile --      Boys Diastolic BP Percentile --      Pulse Rate 02/25/24 1608 89     Resp 02/25/24 1608 20     Temp 02/25/24 1608 98.5 F (36.9 C)     Temp Source 02/25/24 1608 Oral     SpO2 02/25/24 1608 97 %     Weight 02/25/24 1610 166 lb 14.2 oz (75.7 kg)     Height 02/25/24 1610 6' 1 (1.854 m)     Head Circumference --      Peak Flow --      Pain Score 02/25/24 1608 9     Pain Loc --      Pain Education --      Exclude from Growth Chart --     Most recent vital signs: Vitals:   02/25/24 1608  BP: 106/62  Pulse: 89  Resp: 20  Temp: 98.5 F (36.9 C)  SpO2: 97%     General: Awake, no distress.  CV:  Good peripheral perfusion.  Resp:  Normal effort.  Abd:  No distention.  Other:     ED Results / Procedures / Treatments   Labs (all labs ordered are listed, but only abnormal results are displayed) Labs Reviewed  URINALYSIS, ROUTINE W REFLEX MICROSCOPIC - Abnormal; Notable for the following components:      Result Value   Color, Urine RED (*)    APPearance TURBID (*)    Glucose, UA   (*)    Value: TEST NOT REPORTED DUE TO COLOR INTERFERENCE OF URINE PIGMENT   Hgb urine dipstick   (*)    Value: TEST NOT  REPORTED DUE TO COLOR INTERFERENCE OF URINE PIGMENT   Bilirubin Urine   (*)    Value: TEST NOT REPORTED DUE TO COLOR INTERFERENCE OF URINE PIGMENT   Ketones, ur   (*)    Value: TEST NOT REPORTED DUE TO COLOR INTERFERENCE OF URINE PIGMENT   Protein, ur   (*)    Value: TEST NOT REPORTED DUE TO COLOR INTERFERENCE OF URINE PIGMENT   Nitrite   (*)    Value: TEST NOT REPORTED DUE TO COLOR INTERFERENCE OF URINE PIGMENT   Leukocytes,Ua   (*)    Value: TEST NOT REPORTED DUE TO COLOR INTERFERENCE OF URINE PIGMENT   All other components within normal limits  BASIC METABOLIC PANEL WITH GFR - Abnormal; Notable for the following components:   Sodium 133 (*)    Creatinine, Ser 1.44 (*)    Calcium 11.1 (*)    GFR, Estimated 54 (*)  All other components within normal limits  CBC - Abnormal; Notable for the following components:   RBC 4.04 (*)    Hemoglobin 12.5 (*)    HCT 37.7 (*)    All other components within normal limits     EKG     RADIOLOGY     PROCEDURES:  Critical Care performed: No  Procedures   MEDICATIONS ORDERED IN ED: Medications - No data to display   IMPRESSION / MDM / ASSESSMENT AND PLAN / ED COURSE  I reviewed the triage vital signs and the nursing notes.                              Differential diagnosis includes, but is not limited to, urinary tract infection, bladder outlet obstruction, hemorrhagic cystitis  Patient's presentation is most consistent with acute illness / injury with system symptoms.  This patient is a 63 year old male with a past medical history of B-cell lymphoma presenting to the emergency department complaining of worsening hematuria and urethral pain.  He states that he feels as if he is not completely voiding.  Postvoid residual was 100 cc.  I did discuss the patient's case with the urologist on-call who came to evaluate the patient here in the emergency department.  He placed a Foley catheter and will see the patient in clinic for  removal next week.  Did request prescribing the patient oxybutynin  for his symptoms.  I discussed this plan of care with the patient who is agreeable.  He will return to the emergency department for new or worsening symptoms.      FINAL CLINICAL IMPRESSION(S) / ED DIAGNOSES   Final diagnoses:  Hemorrhagic cystitis     Rx / DC Orders   ED Discharge Orders     None        Note:  This document was prepared using Dragon voice recognition software and may include unintentional dictation errors.   [name], MD 02/26/24 [phone]

## 2024-02-25 NOTE — ED Notes (Signed)
 Post Void .

## 2024-02-26 ENCOUNTER — Other Ambulatory Visit: Payer: Self-pay

## 2024-02-27 ENCOUNTER — Inpatient Hospital Stay: Admitting: Oncology

## 2024-02-27 ENCOUNTER — Inpatient Hospital Stay

## 2024-02-28 ENCOUNTER — Ambulatory Visit

## 2024-02-28 DIAGNOSIS — N368 Other specified disorders of urethra: Secondary | ICD-10-CM

## 2024-02-28 NOTE — Progress Notes (Signed)
 Pt came in today to have catheter removed. Foley still had significant amount of blood. He has some pain around tip of penis but I suggest orajel or coconut oil to help sooth that irritation. After conferring with Dr. Twylla we decided to keep in catheter for 2 more days. Pt was treat for a urethral stricture and is having a Cystoscopy in OR with Dr. Twylla on the 12/29. Patient will see me on 12/18 for cath removal and [facility] urine testing.   Mathew Pinal, RN

## 2024-02-29 ENCOUNTER — Other Ambulatory Visit: Payer: Self-pay

## 2024-02-29 ENCOUNTER — Inpatient Hospital Stay: Admission: RE | Admit: 2024-02-29 | Discharge: 2024-02-29 | Attending: Urology | Admitting: Urology

## 2024-02-29 ENCOUNTER — Emergency Department

## 2024-02-29 ENCOUNTER — Inpatient Hospital Stay
Admission: EM | Admit: 2024-02-29 | Discharge: 2024-03-02 | DRG: 671 | Disposition: A | Discharge status: Home / Self Care | Source: Ambulatory Visit | Attending: Internal Medicine | Admitting: Internal Medicine

## 2024-02-29 ENCOUNTER — Encounter: Payer: Self-pay | Admitting: Emergency Medicine

## 2024-02-29 ENCOUNTER — Inpatient Hospital Stay

## 2024-02-29 VITALS — BP 64/40 | HR 79 | Resp 16 | Ht 73.0 in | Wt 167.0 lb

## 2024-02-29 DIAGNOSIS — Z01810 Encounter for preprocedural cardiovascular examination: Secondary | ICD-10-CM

## 2024-02-29 DIAGNOSIS — Z8719 Personal history of other diseases of the digestive system: Secondary | ICD-10-CM | POA: Diagnosis not present

## 2024-02-29 DIAGNOSIS — Y846 Urinary catheterization as the cause of abnormal reaction of the patient, or of later complication, without mention of misadventure at the time of the procedure: Secondary | ICD-10-CM | POA: Diagnosis present

## 2024-02-29 DIAGNOSIS — I10 Essential (primary) hypertension: Secondary | ICD-10-CM | POA: Diagnosis present

## 2024-02-29 DIAGNOSIS — Z9889 Other specified postprocedural states: Secondary | ICD-10-CM

## 2024-02-29 DIAGNOSIS — Z978 Presence of other specified devices: Secondary | ICD-10-CM

## 2024-02-29 DIAGNOSIS — Z809 Family history of malignant neoplasm, unspecified: Secondary | ICD-10-CM

## 2024-02-29 DIAGNOSIS — Z832 Family history of diseases of the blood and blood-forming organs and certain disorders involving the immune mechanism: Secondary | ICD-10-CM | POA: Diagnosis not present

## 2024-02-29 DIAGNOSIS — Z820 Family history of epilepsy and other diseases of the nervous system: Secondary | ICD-10-CM | POA: Diagnosis not present

## 2024-02-29 DIAGNOSIS — C833A Diffuse large b-cell lymphoma, in remission: Secondary | ICD-10-CM | POA: Diagnosis present

## 2024-02-29 DIAGNOSIS — N390 Urinary tract infection, site not specified: Secondary | ICD-10-CM | POA: Diagnosis present

## 2024-02-29 DIAGNOSIS — N179 Acute kidney failure, unspecified: Principal | ICD-10-CM | POA: Diagnosis present

## 2024-02-29 DIAGNOSIS — I959 Hypotension, unspecified: Secondary | ICD-10-CM | POA: Diagnosis present

## 2024-02-29 DIAGNOSIS — A419 Sepsis, unspecified organism: Secondary | ICD-10-CM | POA: Diagnosis present

## 2024-02-29 DIAGNOSIS — C8510 Unspecified B-cell lymphoma, unspecified site: Secondary | ICD-10-CM | POA: Diagnosis present

## 2024-02-29 DIAGNOSIS — R7401 Elevation of levels of liver transaminase levels: Secondary | ICD-10-CM | POA: Diagnosis present

## 2024-02-29 DIAGNOSIS — T83511A Infection and inflammatory reaction due to indwelling urethral catheter, initial encounter: Principal | ICD-10-CM | POA: Diagnosis present

## 2024-02-29 DIAGNOSIS — N139 Obstructive and reflux uropathy, unspecified: Secondary | ICD-10-CM | POA: Diagnosis present

## 2024-02-29 DIAGNOSIS — N3289 Other specified disorders of bladder: Secondary | ICD-10-CM | POA: Diagnosis present

## 2024-02-29 DIAGNOSIS — Z87891 Personal history of nicotine dependence: Secondary | ICD-10-CM | POA: Diagnosis not present

## 2024-02-29 DIAGNOSIS — Z9221 Personal history of antineoplastic chemotherapy: Secondary | ICD-10-CM

## 2024-02-29 DIAGNOSIS — R338 Other retention of urine: Secondary | ICD-10-CM | POA: Diagnosis not present

## 2024-02-29 DIAGNOSIS — R310 Gross hematuria: Secondary | ICD-10-CM | POA: Diagnosis present

## 2024-02-29 DIAGNOSIS — Y738 Miscellaneous gastroenterology and urology devices associated with adverse incidents, not elsewhere classified: Secondary | ICD-10-CM | POA: Diagnosis present

## 2024-02-29 DIAGNOSIS — T83091A Other mechanical complication of indwelling urethral catheter, initial encounter: Secondary | ICD-10-CM | POA: Diagnosis present

## 2024-02-29 DIAGNOSIS — Z8249 Family history of ischemic heart disease and other diseases of the circulatory system: Secondary | ICD-10-CM

## 2024-02-29 DIAGNOSIS — N368 Other specified disorders of urethra: Secondary | ICD-10-CM

## 2024-02-29 DIAGNOSIS — H532 Diplopia: Secondary | ICD-10-CM | POA: Diagnosis present

## 2024-02-29 DIAGNOSIS — Z79899 Other long term (current) drug therapy: Secondary | ICD-10-CM | POA: Diagnosis not present

## 2024-02-29 HISTORY — DX: Other specified disorders of urethra: N36.8

## 2024-02-29 HISTORY — DX: Anemia, unspecified: D64.9

## 2024-02-29 HISTORY — DX: Thrombocytopenia, unspecified: D69.6

## 2024-02-29 HISTORY — DX: Pyuria: R82.81

## 2024-02-29 HISTORY — DX: Gross hematuria: R31.0

## 2024-02-29 HISTORY — DX: Sepsis, unspecified organism: A41.9

## 2024-02-29 HISTORY — DX: Hypotension, unspecified: I95.9

## 2024-02-29 LAB — CBC WITH DIFFERENTIAL/PLATELET
Abs Immature Granulocytes: 0.18 K/uL — ABNORMAL HIGH (ref 0.00–0.07)
Basophils Absolute: 0 K/uL (ref 0.0–0.1)
Basophils Relative: 0 %
Eosinophils Absolute: 0 K/uL (ref 0.0–0.5)
Eosinophils Relative: 0 %
HCT: 36.2 % — ABNORMAL LOW (ref 39.0–52.0)
Hemoglobin: 12.2 g/dL — ABNORMAL LOW (ref 13.0–17.0)
Immature Granulocytes: 2 %
Lymphocytes Relative: 15 %
Lymphs Abs: 1.3 K/uL (ref 0.7–4.0)
MCH: 30.7 pg (ref 26.0–34.0)
MCHC: 33.7 g/dL (ref 30.0–36.0)
MCV: 91 fL (ref 80.0–100.0)
Monocytes Absolute: 0.8 K/uL (ref 0.1–1.0)
Monocytes Relative: 8 %
Neutro Abs: 6.7 K/uL (ref 1.7–7.7)
Neutrophils Relative %: 75 %
Platelets: 166 K/uL (ref 150–400)
RBC: 3.98 MIL/uL — ABNORMAL LOW (ref 4.22–5.81)
RDW: 14.5 % (ref 11.5–15.5)
Smear Review: NORMAL
WBC: 9 K/uL (ref 4.0–10.5)
nRBC: 0 % (ref 0.0–0.2)

## 2024-02-29 LAB — URINALYSIS, ROUTINE W REFLEX MICROSCOPIC
Bilirubin Urine: NEGATIVE
Glucose, UA: NEGATIVE mg/dL
Ketones, ur: NEGATIVE mg/dL
Nitrite: NEGATIVE
Protein, ur: 100 mg/dL — AB
RBC / HPF: >50 RBC/hpf (ref 0–5)
Specific Gravity, Urine: 1.006 (ref 1.005–1.030)
Squamous Epithelial / HPF: 0 /HPF (ref 0–5)
WBC, UA: >50 WBC/hpf (ref 0–5)
pH: 7 (ref 5.0–8.0)

## 2024-02-29 LAB — COMPREHENSIVE METABOLIC PANEL WITH GFR
ALT: 54 U/L — ABNORMAL HIGH (ref 0–44)
AST: 98 U/L — ABNORMAL HIGH (ref 15–41)
Albumin: 3.8 g/dL (ref 3.5–5.0)
Alkaline Phosphatase: 131 U/L — ABNORMAL HIGH (ref 38–126)
Anion gap: 16 — ABNORMAL HIGH (ref 5–15)
BUN: 43 mg/dL — ABNORMAL HIGH (ref 8–23)
CO2: 20 mmol/L — ABNORMAL LOW (ref 22–32)
Calcium: 10.7 mg/dL — ABNORMAL HIGH (ref 8.9–10.3)
Chloride: 95 mmol/L — ABNORMAL LOW (ref 98–111)
Creatinine, Ser: 3.11 mg/dL — ABNORMAL HIGH (ref 0.61–1.24)
GFR, Estimated: 22 mL/min — ABNORMAL LOW (ref >60)
Glucose, Bld: 122 mg/dL — ABNORMAL HIGH (ref 70–99)
Potassium: 4.1 mmol/L (ref 3.5–5.1)
Sodium: 131 mmol/L — ABNORMAL LOW (ref 135–145)
Total Bilirubin: 0.8 mg/dL (ref 0.0–1.2)
Total Protein: 7.1 g/dL (ref 6.5–8.1)

## 2024-02-29 LAB — TROPONIN T, HIGH SENSITIVITY
Troponin T High Sensitivity: 27 ng/L — ABNORMAL HIGH (ref 0–19)
Troponin T High Sensitivity: 20 ng/L — ABNORMAL HIGH (ref 0–19)

## 2024-02-29 LAB — CBC
HCT: 31.6 % — ABNORMAL LOW (ref 39.0–52.0)
Hemoglobin: 11 g/dL — ABNORMAL LOW (ref 13.0–17.0)
MCH: 31.1 pg (ref 26.0–34.0)
MCHC: 34.8 g/dL (ref 30.0–36.0)
MCV: 89.3 fL (ref 80.0–100.0)
Platelets: 119 K/uL — ABNORMAL LOW (ref 150–400)
RBC: 3.54 MIL/uL — ABNORMAL LOW (ref 4.22–5.81)
RDW: 14.2 % (ref 11.5–15.5)
WBC: 5.5 K/uL (ref 4.0–10.5)
nRBC: 0 % (ref 0.0–0.2)

## 2024-02-29 LAB — CREATININE, SERUM
Creatinine, Ser: 2.53 mg/dL — ABNORMAL HIGH (ref 0.61–1.24)
GFR, Estimated: 28 mL/min — ABNORMAL LOW (ref >60)

## 2024-02-29 LAB — LACTIC ACID, PLASMA
Lactic Acid, Venous: 2.7 mmol/L (ref 0.5–1.9)
Lactic Acid, Venous: 2.6 mmol/L (ref 0.5–1.9)

## 2024-02-29 MED ORDER — POLYETHYLENE GLYCOL 3350 17 G PO PACK
17.0000 g | PACK | Freq: Every day | ORAL | Status: DC | PRN
  Administered 2024-03-02: 17 g via ORAL
  Filled 2024-02-29: qty 1

## 2024-02-29 MED ORDER — ACETAMINOPHEN 325 MG PO TABS
650.0000 mg | ORAL_TABLET | Freq: Four times a day (QID) | ORAL | Status: DC | PRN
  Administered 2024-03-01 – 2024-03-02 (×2): 650 mg via ORAL
  Filled 2024-02-29 (×2): qty 2

## 2024-02-29 MED ORDER — BENZOCAINE 10 % MT GEL
Freq: Four times a day (QID) | OROMUCOSAL | Status: DC | PRN
  Administered 2024-02-29: 23:00:00 1 via OROMUCOSAL
  Filled 2024-02-29: qty 9

## 2024-02-29 MED ORDER — ONDANSETRON HCL 4 MG/2ML IJ SOLN: 4.0000 mg | Freq: Four times a day (QID) | INTRAMUSCULAR | Status: DC | PRN

## 2024-02-29 MED ORDER — SODIUM CHLORIDE 0.9 % IV SOLN: INTRAVENOUS | Status: DC

## 2024-02-29 MED ORDER — SODIUM CHLORIDE 0.9 % IV SOLN
2.0000 g | Freq: Once | INTRAVENOUS | Status: AC
  Administered 2024-02-29: 16:00:00 2 g via INTRAVENOUS
  Filled 2024-02-29: qty 20

## 2024-02-29 MED ORDER — HEPARIN SODIUM (PORCINE) 5000 UNIT/ML IJ SOLN: 5000.0000 [IU] | Freq: Three times a day (TID) | INTRAMUSCULAR | Status: DC

## 2024-02-29 MED ORDER — ONDANSETRON HCL 4 MG PO TABS: 4.0000 mg | ORAL_TABLET | Freq: Four times a day (QID) | ORAL | Status: DC | PRN

## 2024-02-29 MED ORDER — OXYCODONE HCL 5 MG PO TABS
5.0000 mg | ORAL_TABLET | ORAL | Status: DC | PRN
  Administered 2024-03-01 – 2024-03-02 (×3): 5 mg via ORAL
  Filled 2024-02-29 (×3): qty 1

## 2024-02-29 MED ORDER — ENSURE PLUS HIGH PROTEIN PO LIQD: 237.0000 mL | Freq: Two times a day (BID) | ORAL | Status: DC

## 2024-02-29 MED ORDER — LACTATED RINGERS IV BOLUS
1000.0000 mL | Freq: Once | INTRAVENOUS | Status: AC
  Administered 2024-02-29: 12:00:00 1000 mL via INTRAVENOUS

## 2024-02-29 MED ORDER — MORPHINE SULFATE (PF) 2 MG/ML IV SOLN
2.0000 mg | INTRAVENOUS | Status: DC | PRN
  Administered 2024-03-01: 10:00:00 2 mg via INTRAVENOUS
  Filled 2024-02-29: qty 1

## 2024-02-29 MED ORDER — ACETAMINOPHEN 650 MG RE SUPP: 650.0000 mg | Freq: Four times a day (QID) | RECTAL | Status: DC | PRN

## 2024-02-29 MED ORDER — LACTATED RINGERS IV BOLUS
1000.0000 mL | Freq: Once | INTRAVENOUS | Status: AC
  Administered 2024-02-29: 13:00:00 1000 mL via INTRAVENOUS

## 2024-02-29 MED ORDER — SODIUM CHLORIDE 0.9 % IV SOLN
2.0000 g | INTRAVENOUS | Status: DC
  Administered 2024-03-01 – 2024-03-02 (×2): 2 g via INTRAVENOUS
  Filled 2024-02-29 (×2): qty 20

## 2024-02-29 NOTE — Progress Notes (Addendum)
 Pt came to PAT for anesthesia interview for upcoming 03-12-24 surgery with Dr Twylla. Pt is hypotensive (see vs flowsheet) and pt is symptomatic. Pt states he is dizzy, very fatigued and has double vision. Pt taken to ED. Jon, charge nurse notified of this   Dr Twylla and Dr Melanee (Oncology) made aware by PAT APP [place])

## 2024-02-29 NOTE — Patient Instructions (Addendum)
 Your procedure is scheduled on:03-12-24 Monday Report to the Registration Desk on the 1st floor of the Medical Mall.Then proceed to the 2nd floor Surgery Desk To find out your arrival time, please call [phone] between 1PM - 3PM on:03-09-24 Friday If your arrival time is 6:00 am, do not arrive before that time as the Medical Mall entrance doors do not open until 6:00 am.  REMEMBER: Instructions that are not followed completely may result in serious medical risk, up to and including death; or upon the discretion of your surgeon and anesthesiologist your surgery may need to be rescheduled.  Do not eat food OR drink liquids after midnight the night before surgery.  No gum chewing or hard candies.  One week prior to surgery:Last dose will be on 03-04-24 Sunday Stop Anti-inflammatories (NSAIDS) such as Advil, Aleve, Ibuprofen, Motrin, Naproxen, Naprosyn and Aspirin  based products such as Excedrin, Goody's Powder, BC Powder. Stop ANY OVER THE COUNTER supplements until after surgery (Vitamin D3,Fish Oil, Vitamin C)  You may however, continue to take Tylenol  if needed for pain up until the day of surgery.  Continue taking all of your other prescription medications up until the day of surgery.  ON THE DAY OF SURGERY ONLY TAKE THESE MEDICATIONS WITH SIPS OF WATER: -atenolol  (TENORMIN )  No Alcohol for 24 hours before or after surgery.  No Smoking including e-cigarettes for 24 hours before surgery.  No chewable tobacco products for at least 6 hours before surgery.  No nicotine patches on the day of surgery.  Do not use any recreational drugs for at least a week (preferably 2 weeks) before your surgery.  Please be advised that the combination of cocaine and anesthesia may have negative outcomes, up to and including death. If you test positive for cocaine, your surgery will be cancelled.  On the morning of surgery brush your teeth with toothpaste and water, you may rinse your mouth with  mouthwash if you wish. Do not swallow any toothpaste or mouthwash.  Do not wear jewelry, make-up, hairpins, clips or nail polish.  For welded (permanent) jewelry: bracelets, anklets, waist bands, etc.  Please have this removed prior to surgery.  If it is not removed, there is a chance that hospital personnel will need to cut it off on the day of surgery.  Do not wear lotions, powders, or perfumes.   Do not shave body hair from the neck down 48 hours before surgery.  Contact lenses, hearing aids and dentures may not be worn into surgery.  Do not bring valuables to the hospital. [facility] is not responsible for any missing/lost belongings or valuables.   Notify your doctor if there is any change in your medical condition (cold, fever, infection).  Wear comfortable clothing (specific to your surgery type) to the hospital.  After surgery, you can help prevent lung complications by doing breathing exercises.  Take deep breaths and cough every 1-2 hours. Your doctor may order a device called an Incentive Spirometer to help you take deep breaths. When coughing or sneezing, hold a pillow firmly against your incision with both hands. This is called splinting. Doing this helps protect your incision. It also decreases belly discomfort.  If you are being admitted to the hospital overnight, leave your suitcase in the car. After surgery it may be brought to your room.  In case of increased patient census, it may be necessary for you, the patient, to continue your postoperative care in the Same Day Surgery department.  If you are  being discharged the day of surgery, you will not be allowed to drive home. You will need a responsible individual to drive you home and stay with you for 24 hours after surgery.   If you are taking public transportation, you will need to have a responsible individual with you.  Please call the Pre-admissions Testing Dept. at [phone] if you have any questions  about these instructions.  Surgery Visitation Policy:  Patients having surgery or a procedure may have two visitors.  Children under the age of 69 must have an adult with them who is not the patient.   Merchandiser, Retail to address health-related social needs:  [email or web address]

## 2024-02-29 NOTE — ED Notes (Signed)
 Bladder scan showed 0 mL. MD Jessup notified.

## 2024-02-29 NOTE — ED Triage Notes (Signed)
 Pt via POV from pre-admit testing, reports he has been feeling weak for the past week or so. Pre admit testing states BP was in the 60s systolic and sent him over here. Denies any pain. Pt has to have surgery on his urethra next week. Pt is A&Ox4 and NAD

## 2024-02-29 NOTE — ED Provider Notes (Signed)
 [facility], [facility], [facility] Provider Note    Event Date/Time   First MD Initiated Contact with Patient 02/29/24 1155     (approximate)   History   Chief Complaint Hypotension   HPI  Christopher Mills is a 63 y.o. male with past medical history of hypertension and B-cell lymphoma who presents to the ED complaining of hypertension.  Patient reports that he has been feeling generally weak and malaised over the past couple of days, went to preop testing today for cystoscopy later this month and was found to be hypotensive.  He reports occasional dizziness and lightheadedness but denies any pain in his chest or difficulty breathing.  He has not had any fevers, cough, nausea, vomiting, or diarrhea.  He does state that he has had ongoing dysuria and hematuria following recent Foley catheter placement 4 days ago.  He denies any associated abdominal or flank pain.  He does state that the hematuria has been gradually improving.     Physical Exam   Triage Vital Signs: ED Triage Vitals  Encounter Vitals Group     BP 02/29/24 1154 (!) 68/53     Girls Systolic BP Percentile --      Girls Diastolic BP Percentile --      Boys Systolic BP Percentile --      Boys Diastolic BP Percentile --      Pulse --      Resp --      Temp --      Temp src --      SpO2 --      Weight 02/29/24 1154 155 lb 10.3 oz (70.6 kg)     Height 02/29/24 1154 6' 1 (1.854 m)     Head Circumference --      Peak Flow --      Pain Score 02/29/24 1154 0     Pain Loc --      Pain Education --      Exclude from Growth Chart --     Most recent vital signs: Vitals:   02/29/24 1515 02/29/24 1530  BP:  98/62  Pulse: 70 64  Resp: 17 16  Temp:    SpO2: 100% 100%    Constitutional: Alert and oriented. Eyes: Conjunctivae are normal. Head: Atraumatic. Nose: No congestion/rhinnorhea. Mouth/Throat: Mucous membranes are moist.  Cardiovascular: Normal rate, regular rhythm. Grossly normal heart sounds.  2+ radial  pulses bilaterally. Respiratory: Normal respiratory effort.  No retractions. Lungs CTAB. Gastrointestinal: Soft and nontender.  No CVA tenderness bilaterally.  No distention. Genitourinary: Foley catheter in place draining light red urine. Musculoskeletal: No lower extremity tenderness nor edema.  Neurologic:  Normal speech and language. No gross focal neurologic deficits are appreciated.    ED Results / Procedures / Treatments   Labs (all labs ordered are listed, but only abnormal results are displayed) Labs Reviewed  CBC WITH DIFFERENTIAL/PLATELET - Abnormal; Notable for the following components:      Result Value   RBC 3.98 (*)    Hemoglobin 12.2 (*)    HCT 36.2 (*)    Abs Immature Granulocytes 0.18 (*)    All other components within normal limits  COMPREHENSIVE METABOLIC PANEL WITH GFR - Abnormal; Notable for the following components:   Sodium 131 (*)    Chloride 95 (*)    CO2 20 (*)    Glucose, Bld 122 (*)    BUN 43 (*)    Creatinine, Ser 3.11 (*)    Calcium 10.7 (*)  AST 98 (*)    ALT 54 (*)    Alkaline Phosphatase 131 (*)    GFR, Estimated 22 (*)    Anion gap 16 (*)    All other components within normal limits  LACTIC ACID, PLASMA - Abnormal; Notable for the following components:   Lactic Acid, Venous 2.7 (*)    All other components within normal limits  TROPONIN T, HIGH SENSITIVITY - Abnormal; Notable for the following components:   Troponin T High Sensitivity 27 (*)    All other components within normal limits  CULTURE, BLOOD (ROUTINE X 2)  CULTURE, BLOOD (ROUTINE X 2)  URINE CULTURE  LACTIC ACID, PLASMA  URINALYSIS, ROUTINE W REFLEX MICROSCOPIC  HIV ANTIBODY (ROUTINE TESTING W REFLEX)  CBC  CREATININE, SERUM  TROPONIN T, HIGH SENSITIVITY     EKG  ED ECG REPORT I, Carlin Palin, the attending physician, personally viewed and interpreted this ECG.   Date: 02/29/2024  EKG Time: 11:58  Rate: 80  Rhythm: normal sinus rhythm  Axis: LAD   Intervals:nonspecific intraventricular conduction delay  ST&T Change: None  RADIOLOGY Chest x-ray reviewed and interpreted by me with no infiltrate, edema, or effusion.  PROCEDURES:  Critical Care performed: Yes, see critical care procedure note(s)  .Critical Care  Performed by: Palin Carlin, MD Authorized by: Palin Carlin, MD   Critical care provider statement:    Critical care time (minutes):  30   Critical care time was exclusive of:  Separately billable procedures and treating other patients and teaching time   Critical care was necessary to treat or prevent imminent or life-threatening deterioration of the following conditions:  Shock   Critical care was time spent personally by me on the following activities:  Development of treatment plan with patient or surrogate, discussions with consultants, evaluation of patient's response to treatment, examination of patient, ordering and review of laboratory studies, ordering and review of radiographic studies, ordering and performing treatments and interventions, pulse oximetry, re-evaluation of patient's condition and review of old charts   I assumed direction of critical care for this patient from another provider in my specialty: no     Care discussed with: admitting provider      MEDICATIONS ORDERED IN ED: Medications  cefTRIAXone  (ROCEPHIN ) 2 g in sodium chloride  0.9 % 100 mL IVPB (2 g Intravenous New Bag/Given 02/29/24 1530)  heparin  injection 5,000 Units (has no administration in time range)  acetaminophen  (TYLENOL ) tablet 650 mg (has no administration in time range)    Or  acetaminophen  (TYLENOL ) suppository 650 mg (has no administration in time range)  oxyCODONE  (Oxy IR/ROXICODONE ) immediate release tablet 5 mg (has no administration in time range)  morphine  (PF) 2 MG/ML injection 2 mg (has no administration in time range)  polyethylene glycol (MIRALAX  / GLYCOLAX ) packet 17 g (has no administration in time range)   ondansetron  (ZOFRAN ) tablet 4 mg (has no administration in time range)    Or  ondansetron  (ZOFRAN ) injection 4 mg (has no administration in time range)  lactated ringers  bolus 1,000 mL (1,000 mLs Intravenous New Bag/Given 02/29/24 1228)  lactated ringers  bolus 1,000 mL (1,000 mLs Intravenous New Bag/Given 02/29/24 1257)     IMPRESSION / MDM / ASSESSMENT AND PLAN / ED COURSE  I reviewed the triage vital signs and the nursing notes.                              63 y.o. male with past  medical history of hypertension and lymphoma who presents to the ED complaining of generalized weakness with for the past couple of days with hypotension noted at preop testing today.  Patient's presentation is most consistent with acute presentation with potential threat to life or bodily function.  Differential diagnosis includes, but is not limited to, sepsis, UTI, anemia, hypovolemia, dehydration, electrolyte abnormality, AKI, ACS.  Patient nontoxic-appearing and in no acute distress, vital signs remarkable for hypotension but otherwise reassuring and do not appear concerning for sepsis.  Sepsis workup initiated but we will hold off on antibiotics, I do have concern for UTI given his recent dysuria and hematuria, but no flank pain or CVA tenderness to suggest pyelonephritis.  Lab results are pending at this time, will hydrate with IV fluids and reassess.  Labs show AKI without acute electrolyte abnormality, he does have a mild transaminitis likely due to hypotension as he denies any abdominal pain and has a benign exam.  Urine appears to be flowing freely from the Foley catheter and no retention noted on bladder scan.  No significant anemia or leukocytosis, lactic acid mildly elevated and will trend following IV fluids.  His BP does seem to be improving following 1.5 L of lactated Ringer 's.  Patient given IV ceftriaxone  given concern for UTI and urine was sent for culture.  Case discussed with hospitalist for  admission.      FINAL CLINICAL IMPRESSION(S) / ED DIAGNOSES   Final diagnoses:  AKI (acute kidney injury)  Hypotension, unspecified hypotension type     Rx / DC Orders   ED Discharge Orders     None        Note:  This document was prepared using Dragon voice recognition software and may include unintentional dictation errors.   [name], MD 02/29/24 [phone]

## 2024-02-29 NOTE — ED Notes (Signed)
 Called CCMD called to place pt on cardiac monitoring

## 2024-02-29 NOTE — ED Notes (Signed)
 This RN and Vet RN cleaned pt after bowel movement in bed, peri care provided, linen changed, gown placed on pt, stretcher locked and in the lowest position, call light within reach.

## 2024-02-29 NOTE — H&P (Signed)
 History and Physical    Christopher Mills FMW:[id number] DOB: 02/09/1961 DOA: 02/29/2024  DOS: the patient was seen and examined on 02/29/2024  PCP: Pcp, No   Patient coming from: Home  I have personally briefly reviewed patient's old medical records in [facility]  Chief Complaint: Low blood pressure  HPI: Christopher Mills is a pleasant 63 y.o. male with medical history significant for HTN and B-cell lymphoma treated with cyclophosphamide  who came into ED after he presented to preop area with low blood pressure.  Patient had dysuria, straining and gross hematuria when he presented to ED on 02/25/2024.  He was evaluated by urology and they advised that patient could have hemorrhagic cystitis from cyclophosphamide , stricture disease, BPH or even malignancy.  They were planning on doing the cystoscopy and was scheduled for the OR 12/29 or clinic cystoscopy earlier.  Patient was discharged home on oxybutynin  10 mg XL daily for bladder spasm, continue Foley catheter and remove Foley catheter in the clinic later this week. While he was at the preop area today 12/17, patient was found to be hypotensive weak, dizzy with double vision.  Patient was taken to ED for further evaluation of hypotension. Patient denies any fever, chills, cough, shortness of breath, nausea, vomiting, diarrhea.  However, he complains of ongoing dysuria and hematuria on Foley catheter placed 4 days ago in the emergency room.  ED Course: Upon arrival to the ED, patient is found to be hypotensive at 68/53, hemoglobin was 12.2 and hematocrit 36.2, BUN was 43 and creatinine of 3.11, baseline creatinine was 0.8, lactate was 2.7, troponin 27.  Patient was given antibiotic, cultures were sent and hospitalist service was consulted for evaluation for admission.  Review of Systems:  ROS  All other systems negative except as noted in the HPI.  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   Pyuria    Salmonella gastroenteritis    Thrombocytopenia    Urethral obstruction     Past Surgical History:  Procedure Laterality Date   BONE MARROW BIOPSY     COLONOSCOPY     EXCISION MASS NECK Right 06/12/2020   Procedure: EXCISION MASS NECK;  Surgeon: [name], MD;  Location: [facility] ORS;  Service: General;  Laterality: Right;   HERNIA REPAIR Right    at age 48-RIH   PORTA CATH INSERTION     PORTACATH PLACEMENT Right 06/24/2020   Procedure: INSERTION PORT-A-CATH;  Surgeon: [name], MD;  Location: [facility] ORS;  Service: General;  Laterality: Right;     reports that he quit smoking about 3 years ago. His smoking use included cigarettes. He has never used smokeless tobacco. He reports that he does not currently use alcohol. He reports that he does not currently use drugs after having used the following drugs: Marijuana.  Allergies[1]  Family History  Problem Relation Age of Onset   Anemia Mother    Hypertension Mother    Goiter Mother    Cancer Father    Cancer Sister    Multiple sclerosis Sister     Prior to Admission medications  Medication Sig Start Date End Date Taking? Authorizing Provider  atenolol  (TENORMIN ) 25 MG tablet Take 1 tablet (25 mg total) by mouth daily. Patient taking differently: Take 25 mg by mouth  every morning. 01/03/24   Scoggins, Amber, NP  cefUROXime  (CEFTIN ) 250 MG tablet Take 1 tablet (250 mg total) by mouth 2 (two) times daily with a meal. 02/20/24   Stoioff, Glendia BROCKS, MD  cholecalciferol (VITAMIN D3) 25 MCG (1000 UNIT) tablet Take 1,000 Units by mouth daily.    [provider]  lisinopril  (ZESTRIL ) 10 MG tablet Take 1 tablet (10 mg total) by mouth daily. Patient taking differently: Take 10 mg by mouth every morning. 01/03/24   Scoggins, Hospital Doctor, NP  Omega-3 Fatty Acids (FISH OIL ADULT GUMMIES PO) Take 1 tablet  by mouth daily at 6 (six) AM.    [provider]  oxybutynin  (DITROPAN  XL) 10 MG 24 hr tablet Take 1 tablet (10 mg total) by mouth daily. Patient not taking: Reported on 02/29/2024 02/25/24 03/27/24  Mulvihill, Caitlin M, MD  oxyCODONE -acetaminophen  (PERCOCET/ROXICET) 5-325 MG tablet Take 1 tablet by mouth every 4 (four) hours as needed for severe pain (pain score 7-10).    [provider]  phenazopyridine  (PYRIDIUM ) 100 MG tablet Take 1 tablet (100 mg total) by mouth 3 (three) times daily with meals. Patient not taking: Reported on 02/29/2024 02/13/24   [name], MD  vitamin C (ASCORBIC ACID) 500 MG tablet Take 500 mg by mouth daily.    [provider]    Physical Exam: Vitals:   02/29/24 1500 02/29/24 1515 02/29/24 1530 02/29/24 1607  BP: (!) 91/53  98/62   Pulse: 74 70 64   Resp: 12 17 16    Temp:    98.2 F (36.8 C)  TempSrc:    Oral  SpO2: 100% 100% 100%   Weight:      Height:        Physical Exam   Constitutional: Alert, awake, calm, comfortable HEENT: Neck supple Respiratory: Clear to auscultation B/L, no wheezing, no rales.  Cardiovascular: Regular rate and rhythm, no murmurs / rubs / gallops. No extremity edema. 2+ pedal pulses. No carotid bruits.  Abdomen: Soft, no tenderness, Bowel sounds positive.  Foley catheter in place Musculoskeletal: no clubbing / cyanosis. Good ROM, no contractures. Normal muscle tone.  Skin: no rashes, lesions, ulcers. Neurologic: CN 2-12 grossly intact. Sensation intact, No focal deficit identified Psychiatric: Alert and oriented x 3. Normal mood.    Labs on Admission: I have personally reviewed following labs and imaging studies  CBC: Recent Labs  Lab 02/25/24 1611 02/29/24 1201  WBC 7.9 9.0  NEUTROABS  --  6.7  HGB 12.5* 12.2*  HCT 37.7* 36.2*  MCV 93.3 91.0  PLT 153 166   Basic Metabolic Panel: Recent Labs  Lab 02/25/24 1611 02/29/24 1201  NA 133* 131*  K 4.8 4.1  CL 99 95*  CO2 22 20*   GLUCOSE 95 122*  BUN 20 43*  CREATININE 1.44* 3.11*  CALCIUM 11.1* 10.7*   GFR: Estimated Creatinine Clearance: 24 mL/min (A) (by C-G formula based on SCr of 3.11 mg/dL (H)). Liver Function Tests: Recent Labs  Lab 02/29/24 1201  AST 98*  ALT 54*  ALKPHOS 131*  BILITOT 0.8  PROT 7.1  ALBUMIN 3.8   No results for input(s): LIPASE, AMYLASE in the last 168 hours. No results for input(s): AMMONIA in the last 168 hours. Coagulation Profile: No results for input(s): INR, PROTIME in the last 168 hours. Cardiac Enzymes: No results for input(s): CKTOTAL, CKMB, CKMBINDEX, TROPONINI, TROPONINIHS in the last 168 hours. BNP (last 3 results) No results for input(s): BNP in the last 8760 hours. HbA1C:  No results for input(s): HGBA1C in the last 72 hours. CBG: No results for input(s): GLUCAP in the last 168 hours. Lipid Profile: No results for input(s): CHOL, HDL, LDLCALC, TRIG, CHOLHDL, LDLDIRECT in the last 72 hours. Thyroid  Function Tests: No results for input(s): TSH, T4TOTAL, FREET4, T3FREE, THYROIDAB in the last 72 hours. Anemia Panel: No results for input(s): VITAMINB12, FOLATE, FERRITIN, TIBC, IRON, RETICCTPCT in the last 72 hours. Urine analysis:    Component Value Date/Time   COLORURINE AMBER (A) 02/29/2024 1455   APPEARANCEUR CLOUDY (A) 02/29/2024 1455   APPEARANCEUR Cloudy (A) 02/20/2024 1346   LABSPEC 1.006 02/29/2024 1455   PHURINE 7.0 02/29/2024 1455   GLUCOSEU NEGATIVE 02/29/2024 1455   HGBUR LARGE (A) 02/29/2024 1455   BILIRUBINUR NEGATIVE 02/29/2024 1455   BILIRUBINUR Comment (A) 02/20/2024 1346   KETONESUR NEGATIVE 02/29/2024 1455   PROTEINUR 100 (A) 02/29/2024 1455   UROBILINOGEN 0.2 01/17/2024 1342   NITRITE NEGATIVE 02/29/2024 1455   LEUKOCYTESUR MODERATE (A) 02/29/2024 1455    Radiological Exams on Admission: I have personally reviewed images DG Chest Portable 1 View Result Date:  02/29/2024 CLINICAL DATA:  Weakness and hypotension. EXAM: PORTABLE CHEST 1 VIEW COMPARISON:  07/12/2020 FINDINGS: Right IJ central venous catheter looped over the right neck as seen previously with tip over the SVC. Surgical clips over the right neck base. Lungs are adequately inflated and otherwise clear. Cardiomediastinal silhouette and remainder of the exam is unchanged. IMPRESSION: 1. No acute cardiopulmonary disease. 2. Right IJ central venous catheter looped over the right neck without significant change. Electronically Signed   By: Toribio Agreste M.D.   On: 02/29/2024 13:49    EKG: My personal interpretation of EKG shows: Sinus rhythm, no ST elevation    Assessment/Plan Principal Problem:   AKI (acute kidney injury) Active Problems:   High grade B-cell lymphoma (HCC)   Sepsis secondary to UTI (HCC)   Foley catheter in place    Assessment and Plan:  63 year old male with history of dysuria and hematuria who was placed on Foley catheter 4 days ago with history of high-grade B-cell lymphoma s/p cyclophosphamide  brought into ED from preop area for hypotension and AKI.  1.  AKI/hypotension - Patient received 3 L of IV fluid in the emergency room. - I will continue him on maintenance IV fluid 100 cc/h - Will check his BMP in the morning. - His urine still shows gross UTI - He was given ceftriaxone  in the emergency room and cultures were sent. - I will continue ceftriaxone  and follow-up cultures.  2.  Hematuria/dysuria on Foley catheter - Urology will see the patient and has a plan to see for cystoscopy at the end of the month - His hemoglobin and hematocrit have been stable - Will continue to monitor hemoglobin and hematocrit - Will follow recommendation from urology  3.  Sepsis secondary to UTI present on admission - Patient was given ceftriaxone  in the emergency room. - Cultures have been sent - Will continue ceftriaxone  and IV fluid. - Follow the cultures  4.  History of  hypertension - Patient is currently hypotensive - It appears that patient was taking lisinopril  10 mg daily and atenolol  25 mg daily. - Those medications will be held at this point and restarted when blood pressure is better.    DVT prophylaxis: SCDs Code Status: Full Code Family Communication: Family was at bedside  Disposition Plan: Home  Consults called: Urology  Admission status: Inpatient, Telemetry bed   Nena Rebel, MD [facility] 02/29/2024, 4:34 PM        [1] No Known Allergies

## 2024-03-01 ENCOUNTER — Ambulatory Visit

## 2024-03-01 DIAGNOSIS — R338 Other retention of urine: Secondary | ICD-10-CM | POA: Diagnosis not present

## 2024-03-01 DIAGNOSIS — N3289 Other specified disorders of bladder: Secondary | ICD-10-CM | POA: Diagnosis not present

## 2024-03-01 DIAGNOSIS — Z978 Presence of other specified devices: Secondary | ICD-10-CM | POA: Diagnosis not present

## 2024-03-01 DIAGNOSIS — C8510 Unspecified B-cell lymphoma, unspecified site: Secondary | ICD-10-CM | POA: Diagnosis not present

## 2024-03-01 DIAGNOSIS — I959 Hypotension, unspecified: Secondary | ICD-10-CM

## 2024-03-01 DIAGNOSIS — R310 Gross hematuria: Secondary | ICD-10-CM | POA: Diagnosis not present

## 2024-03-01 DIAGNOSIS — N390 Urinary tract infection, site not specified: Secondary | ICD-10-CM | POA: Diagnosis not present

## 2024-03-01 DIAGNOSIS — N179 Acute kidney failure, unspecified: Secondary | ICD-10-CM | POA: Diagnosis not present

## 2024-03-01 DIAGNOSIS — A419 Sepsis, unspecified organism: Secondary | ICD-10-CM | POA: Diagnosis not present

## 2024-03-01 LAB — CBC
HCT: 29 % — ABNORMAL LOW (ref 39.0–52.0)
Hemoglobin: 10 g/dL — ABNORMAL LOW (ref 13.0–17.0)
MCH: 30.6 pg (ref 26.0–34.0)
MCHC: 34.5 g/dL (ref 30.0–36.0)
MCV: 88.7 fL (ref 80.0–100.0)
Platelets: 117 K/uL — ABNORMAL LOW (ref 150–400)
RBC: 3.27 MIL/uL — ABNORMAL LOW (ref 4.22–5.81)
RDW: 14.2 % (ref 11.5–15.5)
WBC: 4.5 K/uL (ref 4.0–10.5)
nRBC: 1.3 % — ABNORMAL HIGH (ref 0.0–0.2)

## 2024-03-01 LAB — BASIC METABOLIC PANEL WITH GFR
Anion gap: 12 (ref 5–15)
BUN: 34 mg/dL — ABNORMAL HIGH (ref 8–23)
CO2: 22 mmol/L (ref 22–32)
Calcium: 9.4 mg/dL (ref 8.9–10.3)
Chloride: 101 mmol/L (ref 98–111)
Creatinine, Ser: 1.81 mg/dL — ABNORMAL HIGH (ref 0.61–1.24)
GFR, Estimated: 41 mL/min — ABNORMAL LOW (ref >60)
Glucose, Bld: 93 mg/dL (ref 70–99)
Potassium: 4.3 mmol/L (ref 3.5–5.1)
Sodium: 135 mmol/L (ref 135–145)

## 2024-03-01 LAB — URINE CULTURE: Culture: NO GROWTH

## 2024-03-01 LAB — LACTIC ACID, PLASMA: Lactic Acid, Venous: 1.6 mmol/L (ref 0.5–1.9)

## 2024-03-01 LAB — HIV ANTIBODY (ROUTINE TESTING W REFLEX): HIV Screen 4th Generation wRfx: NONREACTIVE

## 2024-03-01 LAB — PROTIME-INR
INR: 1 (ref 0.8–1.2)
Prothrombin Time: 14.1 s (ref 11.4–15.2)

## 2024-03-01 MED ORDER — CHLORHEXIDINE GLUCONATE CLOTH 2 % EX PADS
6.0000 | MEDICATED_PAD | Freq: Every day | CUTANEOUS | Status: DC
  Administered 2024-03-01 – 2024-03-02 (×2): 6 via TOPICAL

## 2024-03-01 MED ORDER — MIDODRINE HCL 5 MG PO TABS
5.0000 mg | ORAL_TABLET | Freq: Three times a day (TID) | ORAL | Status: DC
  Administered 2024-03-01 – 2024-03-02 (×3): 5 mg via ORAL
  Filled 2024-03-01 (×3): qty 1

## 2024-03-01 MED ORDER — SODIUM CHLORIDE 0.9 % IV SOLN: INTRAVENOUS | Status: DC

## 2024-03-01 NOTE — Consult Note (Signed)
 Urology Consult  I have been asked to see the patient by Dr. Paudel, for evaluation and management of hypotension, AKI, possible UTI.  Chief Complaint: Weakness, hypotension  History of Present Illness: Christopher Mills is a 63 y.o. year old male with PMH B-cell lymphoma treated with chemotherapy including R-CHOP and cyclophosphamide ; a recent history of sensation of incomplete emptying, dysuria, gross hematuria, straining, and weak stream; and fossa navicularis stricture who required Foley catheter placement by Dr. Francisca 5 days ago and is scheduled for cystoscopy under anesthesia with possible dilation versus biopsy with Dr. Twylla on 03/12/2024 who was admitted yesterday for management of AKI, hypotension, and concern for sepsis due to UTI.  Admission labs notable for white count 9.0; hemoglobin 12.2; creatinine 3.11 (baseline 0.8-0.9); lactate 2.7; and UA with >50 RBCs/hpf, >50 WBC/hpf, rare bacteria, and WBC clumps.  Urine culture pending, he has received antibiotics as below.  Foley catheter has been continued and he was noted to have increased hematuria overnight.  This morning, he noted intermittent intense sensations of urinary urgency and penile pain.  A.m. labs showed normalized lactate, 1.6, decreased white count, 4.5, decreased hemoglobin, 10.0, and decreased creatinine, 1.81.  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   Pyuria    Salmonella gastroenteritis    Thrombocytopenia    Urethral obstruction     Past Surgical History:  Procedure Laterality Date   BONE MARROW BIOPSY     COLONOSCOPY     EXCISION MASS NECK Right 06/12/2020   Procedure: EXCISION MASS NECK;  Surgeon: [name], MD;  Location: [facility] OR[facility];  Service: General;  Laterality: Right;   HERNIA REPAIR Right    at age 25-[facility]H   PORTA CATH INSERTION     PORTACATH PLACEMENT Right 06/24/2020   Procedure: INSERTION PORT-A-CATH;  Surgeon: [name], MD;  Location: [facility] OR[facility];  Service: General;  Laterality: Right;    Home Medications:  Active Medications[1]  Allergies: Allergies[2]  Family History  Problem Relation Age of Onset   Anemia Mother    Hypertension Mother    Goiter Mother    Cancer Father    Cancer Sister    Multiple sclerosis Sister     Social History:  reports that he quit smoking about 3 years ago. His smoking use included cigarettes. He has never used smokeless tobacco. He reports that he does not currently use alcohol. He reports that he does not currently use drugs after having used the following drugs: Marijuana.  ROS: A complete review of systems was performed.  All systems are negative except for pertinent findings as noted.  Physical Exam:  Vital signs in last 24 hours: Temp:  [97.9 F (36.6 C)-99.6  F (37.6 C)] 99.1 F (37.3 C) (12/18 1600) Pulse Rate:  [65-107] 85 (12/18 1630) Resp:  [17-19] 18 (12/18 1600) BP: (77-150)/(43-123) 80/50 (12/18 1600) SpO2:  [85 %-100 %] 99 % (12/18 1630) Constitutional:  Alert and oriented, no acute distress HEENT:  AT, moist mucus membranes Cardiovascular: No clubbing, cyanosis, or edema Respiratory: Normal respiratory effort GU: Foley catheter in place draining cherry red urine, no obstructing clots seen Skin: No rashes, bruises or suspicious lesions Neurologic: Grossly intact, no focal deficits, moving all 4  extremities Psychiatric: Normal mood and affect  Laboratory Data:  Recent Labs    02/29/24 1201 02/29/24 2003 03/01/24 0606  WBC 9.0 5.5 4.5  HGB 12.2* 11.0* 10.0*  HCT 36.2* 31.6* 29.0*   Recent Labs    02/29/24 1201 02/29/24 2003 03/01/24 0606  NA 131*  --  135  K 4.1  --  4.3  CL 95*  --  101  CO2 20*  --  22  GLUCOSE 122*  --  93  BUN 43*  --  34*  CREATININE 3.11* 2.53* 1.81*  CALCIUM 10.7*  --  9.4   Recent Labs    03/01/24 0606  INR 1.0   Urinalysis    Component Value Date/Time   COLORURINE AMBER (A) 02/29/2024 1455   APPEARANCEUR CLOUDY (A) 02/29/2024 1455   APPEARANCEUR Cloudy (A) 02/20/2024 1346   LABSPEC 1.006 02/29/2024 1455   PHURINE 7.0 02/29/2024 1455   GLUCOSEU NEGATIVE 02/29/2024 1455   HGBUR LARGE (A) 02/29/2024 1455   BILIRUBINUR NEGATIVE 02/29/2024 1455   BILIRUBINUR Comment (A) 02/20/2024 1346   KETONESUR NEGATIVE 02/29/2024 1455   PROTEINUR 100 (A) 02/29/2024 1455   UROBILINOGEN 0.2 01/17/2024 1342   NITRITE NEGATIVE 02/29/2024 1455   LEUKOCYTESUR MODERATE (A) 02/29/2024 1455   Results for orders placed or performed during the hospital encounter of 02/29/24  Culture, blood (routine x 2)     Status: None (Preliminary result)   Collection Time: 02/29/24 12:01 PM   Specimen: BLOOD  Result Value Ref Range Status   Specimen Description BLOOD BLOOD LEFT FOREARM  Final   Special Requests   Final    BOTTLES DRAWN AEROBIC AND ANAEROBIC Blood Culture results may not be optimal due to an inadequate volume of blood received in culture bottles   Culture   Final    NO GROWTH < 24 HOURS Performed at [facility], [address] [place], [place] [address]    Report Status PENDING  Incomplete  Culture, blood (routine x 2)     Status: None (Preliminary result)   Collection Time: 02/29/24  8:03 PM   Specimen: BLOOD  Result Value Ref Range Status   Specimen Description BLOOD BLOOD RIGHT FOREARM  Final   Special Requests   Final     BOTTLES DRAWN AEROBIC AND ANAEROBIC Blood Culture adequate volume   Culture   Final    NO GROWTH < 12 HOURS Performed at [facility], [address], [place], [place] [address]    Report Status PENDING  Incomplete    Radiologic Imaging: DG Chest Portable 1 View Result Date: 02/29/2024 CLINICAL DATA:  Weakness and hypotension. EXAM: PORTABLE CHEST 1 VIEW COMPARISON:  07/12/2020 FINDINGS: Right IJ central venous catheter looped over the right neck as seen previously with tip over the SVC. Surgical clips over the right neck base. Lungs are adequately inflated and otherwise clear. Cardiomediastinal silhouette and remainder of the exam is unchanged. IMPRESSION: 1. No acute cardiopulmonary disease. 2. Right  IJ central venous catheter looped over the right neck without significant change. Electronically Signed   By: Toribio Agreste M.D.   On: 02/29/2024 13:49   Cath Change/ Replacement  Patient is present today for a catheter change due to clot obstruction of his catheter.  8ml of water was removed from the balloon, a 16FR coud silicone foley cath was removed without difficulty.  Patient was cleaned and prepped in a sterile fashion with betadine and 2% lidocaine  jelly was instilled into the urethra. A 20 FR foley cath was replaced into the bladder, no complications were noted. Urine return was red in color.  Patient tolerated well.  Bladder Irrigation  Due to clot retention patient is present today for a bladder irrigation. Patient was cleaned and prepped in a sterile fashion. 550 mL of sterile water was instilled into the bladder with a 70mL Toomey syringe through the catheter in place.  of urine return was cleared from the bladder with evacuation of 20ccs of clot material. Efflux cleared from maroon to cherry red with the procedure and the catheter irrigated easily. Upon completion, the catheter was draining well and was reattached to the night bag for drainage. Patient tolerated well.    Performed by: Oris Staffieri, PA-C   Assessment & Plan:  63 y.o. male with PMH B-cell lymphoma treated with chemotherapy including R-CHOP and cyclophosphamide ; a recent history of sensation of incomplete emptying, dysuria, gross hematuria, straining, and weak stream; and fossa navicularis stricture who required Foley catheter placement by Dr. Francisca 5 days ago and is scheduled for cystoscopy under anesthesia with possible dilation versus biopsy with Dr. Twylla on 03/12/2024 who was admitted yesterday for management of AKI, hypotension, and concern for sepsis due to UTI.  AKI likely combined pre and postrenal in the setting of hypotension and clot retention.  I attempted to manually irrigate his Foley catheter at the bedside, however was unable to pull back on the syringe consistent with clot obstruction of his catheter.  At that time, I elected to upsize his Foley catheter at the bedside, see note above.  I subsequently irrigated his new catheter without difficulty and cleared about 20 cc of clot from his bladder.  Patient tolerated well.  Recommendations: - Okay to hold antibiotics from our perspective given no leukocytosis and negative recent urine culture, though reasonable to repeat urine culture if suspicion for new UTI - Continue Foley catheter, manually irrigate as needed to clear obstructing clots - Trend CBC - Keep plans for cystoscopy under anesthesia with Dr. Twylla later this month  Thank you for involving me in this patient's care, I will continue to follow along.  Yanett Conkright, PA-C 03/01/2024 5:02 PM      [1]  Current Meds  Medication Sig   atenolol  (TENORMIN ) 25 MG tablet Take 1 tablet (25 mg total) by mouth daily. (Patient taking differently: Take 25 mg by mouth every morning.)   cefUROXime  (CEFTIN ) 250 MG tablet Take 1 tablet (250 mg total) by mouth 2 (two) times daily with a meal.   cholecalciferol (VITAMIN D3) 25 MCG (1000 UNIT) tablet Take 1,000  Units by mouth daily.   lisinopril  (ZESTRIL ) 10 MG tablet Take 1 tablet (10 mg total) by mouth daily. (Patient taking differently: Take 10 mg by mouth every morning.)   Omega-3 Fatty Acids (FISH OIL ADULT GUMMIES PO) Take 1 tablet by mouth daily at 6 (six) AM.   oxyCODONE -acetaminophen  (PERCOCET/ROXICET) 5-325 MG tablet Take 1 tablet by mouth every 4 (four) hours  as needed for severe pain (pain score 7-10).   vitamin C (ASCORBIC ACID) 500 MG tablet Take 500 mg by mouth daily.  [2] No Known Allergies

## 2024-03-01 NOTE — Plan of Care (Signed)
 Pt hypotensive. Dr notified and gave Scheduled med.  Problem: Education: Goal: Knowledge of General Education information will improve Description: Including pain rating scale, medication(s)/side effects and non-pharmacologic comfort measures Outcome: Not Progressing   Problem: Health Behavior/Discharge Planning: Goal: Ability to manage health-related needs will improve Outcome: Not Progressing   Problem: Clinical Measurements: Goal: Ability to maintain clinical measurements within normal limits will improve Outcome: Not Progressing Goal: Will remain free from infection Outcome: Not Progressing Goal: Diagnostic test results will improve Outcome: Not Progressing Goal: Respiratory complications will improve Outcome: Not Progressing Goal: Cardiovascular complication will be avoided Outcome: Not Progressing   Problem: Activity: Goal: Risk for activity intolerance will decrease Outcome: Not Progressing   Problem: Nutrition: Goal: Adequate nutrition will be maintained Outcome: Not Progressing   Problem: Coping: Goal: Level of anxiety will decrease Outcome: Not Progressing   Problem: Elimination: Goal: Will not experience complications related to bowel motility Outcome: Not Progressing Goal: Will not experience complications related to urinary retention Outcome: Not Progressing   Problem: Pain Managment: Goal: General experience of comfort will improve and/or be controlled Outcome: Not Progressing   Problem: Safety: Goal: Ability to remain free from injury will improve Outcome: Not Progressing   Problem: Skin Integrity: Goal: Risk for impaired skin integrity will decrease Outcome: Not Progressing

## 2024-03-01 NOTE — TOC CM/SW Note (Signed)
 Transition of Care [facility]) - Inpatient Brief Assessment   Patient Details  Name: Christopher Mills MRN: [id number] Date of Birth: 08-14-1960  [facility] of [facility]) CM/SW Contact:    Nathanael CHRISTELLA Ring, RN Phone Number: 03/01/2024, 3:42 PM   Clinical Narrative: PCP resources added to AVS.  No other needs identified at this time.     Transition of Care Asessment: Insurance and Status: Insurance coverage has been reviewed Patient has primary care physician: No (PCP list added to AVS) Home environment has been reviewed: Home Prior level of function:: Independent Prior/Current Home Services: No current home services Social Drivers of Health Review: SDOH reviewed no interventions necessary Readmission risk has been reviewed: Yes Transition of care needs: transition of care needs identified, TOC will continue to follow

## 2024-03-01 NOTE — Progress Notes (Signed)
 [facility]  - [place] at [facility]   PATIENT NAME: Christopher Mills    MR#:  [id number]  DATE OF BIRTH:  [birth year]  SUBJECTIVE:  patient seen earlier. Met wife at bedside. Came in with recurrent hematuria and pain around the urethral area. Clot was removed by urology PA earlier. Patient feels a bit better and pain much improved. Foley exchanged. Has blood he output. Patient's blood pressure has been on the softer side he is overall feeling weak but alert oriented and tolerating PO diet. Recently during his couple ER visits and outpatient blood pressure systolic has been staying 90 to 100. Getting IV fluids. Started on PO midodrine     VITALS:  Blood pressure (!) 79/46, pulse (!) 101, temperature 99.6 F (37.6 C), resp. rate 18, height 6' 1 (1.854 m), weight 70.6 kg, SpO2 97%.  PHYSICAL EXAMINATION:   GENERAL:  63 y.o.-year-old patient with no acute distress.  LUNGS: Normal breath sounds bilaterally, no wheezing CARDIOVASCULAR: S1, S2 normal. No murmur   ABDOMEN: Soft, Foley+ with Bloody urine EXTREMITIES: No  edema b/l.    NEUROLOGIC: nonfocal  patient is alert and awake.   LABORATORY PANEL:  CBC Recent Labs  Lab 03/01/24 0606  WBC 4.5  HGB 10.0*  HCT 29.0*  PLT 117*    Chemistries  Recent Labs  Lab 02/29/24 1201 02/29/24 2003 03/01/24 0606  NA 131*  --  135  K 4.1  --  4.3  CL 95*  --  101  CO2 20*  --  22  GLUCOSE 122*  --  93  BUN 43*  --  34*  CREATININE 3.11*   < > 1.81*  CALCIUM 10.7*  --  9.4  AST 98*  --   --   ALT 54*  --   --   ALKPHOS 131*  --   --   BILITOT 0.8  --   --    < > = values in this interval not displayed.    RADIOLOGY:  DG Chest Portable 1 View Result Date: 02/29/2024 CLINICAL DATA:  Weakness and hypotension. EXAM: PORTABLE CHEST 1 VIEW COMPARISON:  07/12/2020 FINDINGS: Right IJ central venous catheter looped over the right neck as seen previously with tip over the SVC. Surgical clips over the right neck base. Lungs  are adequately inflated and otherwise clear. Cardiomediastinal silhouette and remainder of the exam is unchanged. IMPRESSION: 1. No acute cardiopulmonary disease. 2. Right IJ central venous catheter looped over the right neck without significant change. Electronically Signed   By: Toribio Agreste M.D.   On: 02/29/2024 13:49    Assessment and Plan  Per H and P Christopher Mills is a pleasant 63 y.o. male with medical history significant for HTN and B-cell lymphoma treated with cyclophosphamide  who came into ED after he presented to preop area with low blood pressure.  Patient had dysuria, straining and gross hematuria when he presented to ED on 02/25/2024.  He was evaluated by urology and they advised that patient could have hemorrhagic cystitis from cyclophosphamide , stricture disease, BPH or even malignancy.  They were planning on doing the cystoscopy and was scheduled for the OR 12/29 or clinic cystoscopy earlier.  Patient was discharged home on oxybutynin  10 mg XL daily for bladder spasm, continue Foley catheter and remove Foley catheter in the clinic later this week. While he was at the preop area today 12/17, patient was found to be hypotensive weak, dizzy with double vision.  Patient was taken to ED  for further evaluation of hypotension. Patient denies any fever, chills, cough, shortness of breath, nausea, vomiting, diarrhea.  However, he complains of ongoing dysuria and hematuria on Foley catheter placed 4 days ago in the emergency room.  AKI-- appears obstructive uropathy with significant elevated creatinine - Patient received 3 L of IV fluid in the emergency room. - I will continue him on maintenance IV fluid 150 cc/h - .Came in with creatinine of 3.1-- 1.8 -- patient was seen by urology and patient had clot retention in the urethra which was removed. Good urine output since  however continues with bloody urine - His urine still shows gross UTI-- urine culture pending - He was given ceftriaxone   in the emergency room and cultures were sent. - I will continue ceftriaxone  and follow-up cultures. -- Urology plans to continue monitoring and patient scheduled to get procedure on 29th December with Dr. Twylla --h/h is stable   Sepsis secondary to UTI present on admission - Patient was given ceftriaxone  in the emergency room. - Cultures have been sent - Will continue ceftriaxone  and IV fluid. -Blood cultures so far negative -- urine culture pending -- trend lactic acid    History of hypertension Relative hypotension - It appears that patient was taking lisinopril  10 mg daily and atenolol  25 mg daily. - Those medications will be held at this point and restarted when blood pressure is better. --started midodrine -- looking at notes from the recent ER visit and outpatient recently patient has been running low blood pressure and he acknowledges that. Systolic anywhere from 80 to 100. -- Patient denies any dizziness or lightheadedness with soft blood pressure. Will continue to monitor. If continues to decline or become symptomatic transfer to step down for pressers --Manual BP taken durign rounds was 104 systolic  History of diffuse B-cell lymphoma -- patient followed by Dr. Melanee at the cancer center   Procedures: Family communication :wife Consults : urology CODE STATUS: full DVT Prophylaxis : SCD Level of care: Telemetry Status is: Inpatient Remains inpatient appropriate because: AKI,     TOTAL TIME TAKING CARE OF THIS PATIENT: 40 minutes.  >50% time spent on counselling and coordination of care  Note: This dictation was prepared with Dragon dictation along with smaller phrase technology. Any transcriptional errors that result from this process are unintentional.  Leita Blanch M.D    [facility]   CC: Primary care physician; Pcp, No

## 2024-03-01 NOTE — H&P (View-Only) (Signed)
 Urology Consult  I have been asked to see the patient by Dr. Paudel, for evaluation and management of hypotension, AKI, possible UTI.  Chief Complaint: Weakness, hypotension  History of Present Illness: Christopher Mills is a 63 y.o. year old male with PMH B-cell lymphoma treated with chemotherapy including R-CHOP and cyclophosphamide ; a recent history of sensation of incomplete emptying, dysuria, gross hematuria, straining, and weak stream; and fossa navicularis stricture who required Foley catheter placement by Dr. Francisca 5 days ago and is scheduled for cystoscopy under anesthesia with possible dilation versus biopsy with Dr. Twylla on 03/12/2024 who was admitted yesterday for management of AKI, hypotension, and concern for sepsis due to UTI.  Admission labs notable for white count 9.0; hemoglobin 12.2; creatinine 3.11 (baseline 0.8-0.9); lactate 2.7; and UA with >50 RBCs/hpf, >50 WBC/hpf, rare bacteria, and WBC clumps.  Urine culture pending, he has received antibiotics as below.  Foley catheter has been continued and he was noted to have increased hematuria overnight.  This morning, he noted intermittent intense sensations of urinary urgency and penile pain.  A.m. labs showed normalized lactate, 1.6, decreased white count, 4.5, decreased hemoglobin, 10.0, and decreased creatinine, 1.81.  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   Pyuria    Salmonella gastroenteritis    Thrombocytopenia    Urethral obstruction     Past Surgical History:  Procedure Laterality Date   BONE MARROW BIOPSY     COLONOSCOPY     EXCISION MASS NECK Right 06/12/2020   Procedure: EXCISION MASS NECK;  Surgeon: [name], MD;  Location: [facility] OR[facility];  Service: General;  Laterality: Right;   HERNIA REPAIR Right    at age 25-[facility]   PORTA CATH INSERTION     PORTACATH PLACEMENT Right 06/24/2020   Procedure: INSERTION PORT-A-CATH;  Surgeon: [name], MD;  Location: [facility] OR[facility];  Service: General;  Laterality: Right;    Home Medications:  Active Medications[1]  Allergies: Allergies[2]  Family History  Problem Relation Age of Onset   Anemia Mother    Hypertension Mother    Goiter Mother    Cancer Father    Cancer Sister    Multiple sclerosis Sister     Social History:  reports that he quit smoking about 3 years ago. His smoking use included cigarettes. He has never used smokeless tobacco. He reports that he does not currently use alcohol. He reports that he does not currently use drugs after having used the following drugs: Marijuana.  ROS: A complete review of systems was performed.  All systems are negative except for pertinent findings as noted.  Physical Exam:  Vital signs in last 24 hours: Temp:  [97.9 F (36.6 C)-99.6  F (37.6 C)] 99.1 F (37.3 C) (12/18 1600) Pulse Rate:  [65-107] 85 (12/18 1630) Resp:  [17-19] 18 (12/18 1600) BP: (77-150)/(43-123) 80/50 (12/18 1600) SpO2:  [85 %-100 %] 99 % (12/18 1630) Constitutional:  Alert and oriented, no acute distress HEENT:  AT, moist mucus membranes Cardiovascular: No clubbing, cyanosis, or edema Respiratory: Normal respiratory effort GU: Foley catheter in place draining cherry red urine, no obstructing clots seen Skin: No rashes, bruises or suspicious lesions Neurologic: Grossly intact, no focal deficits, moving all 4  extremities Psychiatric: Normal mood and affect  Laboratory Data:  Recent Labs    02/29/24 1201 02/29/24 2003 03/01/24 0606  WBC 9.0 5.5 4.5  HGB 12.2* 11.0* 10.0*  HCT 36.2* 31.6* 29.0*   Recent Labs    02/29/24 1201 02/29/24 2003 03/01/24 0606  NA 131*  --  135  K 4.1  --  4.3  CL 95*  --  101  CO2 20*  --  22  GLUCOSE 122*  --  93  BUN 43*  --  34*  CREATININE 3.11* 2.53* 1.81*  CALCIUM 10.7*  --  9.4   Recent Labs    03/01/24 0606  INR 1.0   Urinalysis    Component Value Date/Time   COLORURINE AMBER (A) 02/29/2024 1455   APPEARANCEUR CLOUDY (A) 02/29/2024 1455   APPEARANCEUR Cloudy (A) 02/20/2024 1346   LABSPEC 1.006 02/29/2024 1455   PHURINE 7.0 02/29/2024 1455   GLUCOSEU NEGATIVE 02/29/2024 1455   HGBUR LARGE (A) 02/29/2024 1455   BILIRUBINUR NEGATIVE 02/29/2024 1455   BILIRUBINUR Comment (A) 02/20/2024 1346   KETONESUR NEGATIVE 02/29/2024 1455   PROTEINUR 100 (A) 02/29/2024 1455   UROBILINOGEN 0.2 01/17/2024 1342   NITRITE NEGATIVE 02/29/2024 1455   LEUKOCYTESUR MODERATE (A) 02/29/2024 1455   Results for orders placed or performed during the hospital encounter of 02/29/24  Culture, blood (routine x 2)     Status: None (Preliminary result)   Collection Time: 02/29/24 12:01 PM   Specimen: BLOOD  Result Value Ref Range Status   Specimen Description BLOOD BLOOD LEFT FOREARM  Final   Special Requests   Final    BOTTLES DRAWN AEROBIC AND ANAEROBIC Blood Culture results may not be optimal due to an inadequate volume of blood received in culture bottles   Culture   Final    NO GROWTH < 24 HOURS Performed at [facility], [address] [place], [place] [address]    Report Status PENDING  Incomplete  Culture, blood (routine x 2)     Status: None (Preliminary result)   Collection Time: 02/29/24  8:03 PM   Specimen: BLOOD  Result Value Ref Range Status   Specimen Description BLOOD BLOOD RIGHT FOREARM  Final   Special Requests   Final     BOTTLES DRAWN AEROBIC AND ANAEROBIC Blood Culture adequate volume   Culture   Final    NO GROWTH < 12 HOURS Performed at [facility], [address], [place], [place] [address]    Report Status PENDING  Incomplete    Radiologic Imaging: DG Chest Portable 1 View Result Date: 02/29/2024 CLINICAL DATA:  Weakness and hypotension. EXAM: PORTABLE CHEST 1 VIEW COMPARISON:  07/12/2020 FINDINGS: Right IJ central venous catheter looped over the right neck as seen previously with tip over the SVC. Surgical clips over the right neck base. Lungs are adequately inflated and otherwise clear. Cardiomediastinal silhouette and remainder of the exam is unchanged. IMPRESSION: 1. No acute cardiopulmonary disease. 2. Right  IJ central venous catheter looped over the right neck without significant change. Electronically Signed   By: Toribio Agreste M.D.   On: 02/29/2024 13:49   Cath Change/ Replacement  Patient is present today for a catheter change due to clot obstruction of his catheter.  8ml of water was removed from the balloon, a 16FR coud silicone foley cath was removed without difficulty.  Patient was cleaned and prepped in a sterile fashion with betadine and 2% lidocaine  jelly was instilled into the urethra. A 20 FR foley cath was replaced into the bladder, no complications were noted. Urine return was red in color.  Patient tolerated well.  Bladder Irrigation  Due to clot retention patient is present today for a bladder irrigation. Patient was cleaned and prepped in a sterile fashion. 550 mL of sterile water was instilled into the bladder with a 70mL Toomey syringe through the catheter in place.  of urine return was cleared from the bladder with evacuation of 20ccs of clot material. Efflux cleared from maroon to cherry red with the procedure and the catheter irrigated easily. Upon completion, the catheter was draining well and was reattached to the night bag for drainage. Patient tolerated well.    Performed by: Oris Staffieri, PA-C   Assessment & Plan:  63 y.o. male with PMH B-cell lymphoma treated with chemotherapy including R-CHOP and cyclophosphamide ; a recent history of sensation of incomplete emptying, dysuria, gross hematuria, straining, and weak stream; and fossa navicularis stricture who required Foley catheter placement by Dr. Francisca 5 days ago and is scheduled for cystoscopy under anesthesia with possible dilation versus biopsy with Dr. Twylla on 03/12/2024 who was admitted yesterday for management of AKI, hypotension, and concern for sepsis due to UTI.  AKI likely combined pre and postrenal in the setting of hypotension and clot retention.  I attempted to manually irrigate his Foley catheter at the bedside, however was unable to pull back on the syringe consistent with clot obstruction of his catheter.  At that time, I elected to upsize his Foley catheter at the bedside, see note above.  I subsequently irrigated his new catheter without difficulty and cleared about 20 cc of clot from his bladder.  Patient tolerated well.  Recommendations: - Okay to hold antibiotics from our perspective given no leukocytosis and negative recent urine culture, though reasonable to repeat urine culture if suspicion for new UTI - Continue Foley catheter, manually irrigate as needed to clear obstructing clots - Trend CBC - Keep plans for cystoscopy under anesthesia with Dr. Twylla later this month  Thank you for involving me in this patient's care, I will continue to follow along.  Yanett Conkright, PA-C 03/01/2024 5:02 PM      [1]  Current Meds  Medication Sig   atenolol  (TENORMIN ) 25 MG tablet Take 1 tablet (25 mg total) by mouth daily. (Patient taking differently: Take 25 mg by mouth every morning.)   cefUROXime  (CEFTIN ) 250 MG tablet Take 1 tablet (250 mg total) by mouth 2 (two) times daily with a meal.   cholecalciferol (VITAMIN D3) 25 MCG (1000 UNIT) tablet Take 1,000  Units by mouth daily.   lisinopril  (ZESTRIL ) 10 MG tablet Take 1 tablet (10 mg total) by mouth daily. (Patient taking differently: Take 10 mg by mouth every morning.)   Omega-3 Fatty Acids (FISH OIL ADULT GUMMIES PO) Take 1 tablet by mouth daily at 6 (six) AM.   oxyCODONE -acetaminophen  (PERCOCET/ROXICET) 5-325 MG tablet Take 1 tablet by mouth every 4 (four) hours  as needed for severe pain (pain score 7-10).   vitamin C (ASCORBIC ACID) 500 MG tablet Take 500 mg by mouth daily.  [2] No Known Allergies

## 2024-03-01 NOTE — Discharge Instructions (Signed)
 Some PCP options in [place] area- not a comprehensive list  [facility]- [phone] [facility]- [phone] [facility]- [phone] [facility]- [phone] [facility]- [phone] Nichole Molly- [phone]  or [facility] Physician Referral Line [phone]

## 2024-03-02 ENCOUNTER — Other Ambulatory Visit: Payer: Self-pay

## 2024-03-02 DIAGNOSIS — Z978 Presence of other specified devices: Secondary | ICD-10-CM | POA: Diagnosis not present

## 2024-03-02 DIAGNOSIS — I959 Hypotension, unspecified: Secondary | ICD-10-CM | POA: Diagnosis not present

## 2024-03-02 DIAGNOSIS — R310 Gross hematuria: Secondary | ICD-10-CM | POA: Diagnosis not present

## 2024-03-02 DIAGNOSIS — N390 Urinary tract infection, site not specified: Secondary | ICD-10-CM | POA: Diagnosis not present

## 2024-03-02 DIAGNOSIS — A419 Sepsis, unspecified organism: Secondary | ICD-10-CM | POA: Diagnosis not present

## 2024-03-02 DIAGNOSIS — N179 Acute kidney failure, unspecified: Secondary | ICD-10-CM | POA: Diagnosis not present

## 2024-03-02 LAB — CBC
HCT: 26.7 % — ABNORMAL LOW (ref 39.0–52.0)
Hemoglobin: 9 g/dL — ABNORMAL LOW (ref 13.0–17.0)
MCH: 30.7 pg (ref 26.0–34.0)
MCHC: 33.7 g/dL (ref 30.0–36.0)
MCV: 91.1 fL (ref 80.0–100.0)
Platelets: 101 K/uL — ABNORMAL LOW (ref 150–400)
RBC: 2.93 MIL/uL — ABNORMAL LOW (ref 4.22–5.81)
RDW: 14.5 % (ref 11.5–15.5)
WBC: 2.8 K/uL — ABNORMAL LOW (ref 4.0–10.5)
nRBC: 0 % (ref 0.0–0.2)

## 2024-03-02 LAB — BASIC METABOLIC PANEL WITH GFR
Anion gap: 11 (ref 5–15)
BUN: 17 mg/dL (ref 8–23)
CO2: 19 mmol/L — ABNORMAL LOW (ref 22–32)
Calcium: 8.6 mg/dL — ABNORMAL LOW (ref 8.9–10.3)
Chloride: 106 mmol/L (ref 98–111)
Creatinine, Ser: 1.21 mg/dL (ref 0.61–1.24)
GFR, Estimated: >60 mL/min
Glucose, Bld: 133 mg/dL — ABNORMAL HIGH (ref 70–99)
Potassium: 4 mmol/L (ref 3.5–5.1)
Sodium: 135 mmol/L (ref 135–145)

## 2024-03-02 MED ORDER — MIDODRINE HCL 5 MG PO TABS
5.0000 mg | ORAL_TABLET | Freq: Three times a day (TID) | ORAL | 0 refills | Status: DC
  Filled 2024-03-02: qty 90, 30d supply, fill #0

## 2024-03-02 NOTE — Progress Notes (Cosign Needed)
 Urology Inpatient Progress Note  Subjective: No acute events overnight. He was febrile overnight, Tmax 38.2 C.  He is normotensive this morning. Creatinine down, 1.21.  White count down, 2.8.  Hemoglobin down, 9.0.  Admission urine culture has finalized with no growth.  Blood cultures pending with no growth at 2 days.  On antibiotics as below. Foley catheter in place draining intermittently red versus amber with blood product sediment urine. He is accompanied today by his brother at the bedside.  He reports feeling somewhat better today.  Anti-infectives: Anti-infectives (From admission, onward)    Start     Dose/Rate Route Frequency Ordered Stop   03/01/24 0600  cefTRIAXone  (ROCEPHIN ) 2 g in sodium chloride  0.9 % 100 mL IVPB        2 g 200 mL/hr over 30 Minutes Intravenous Every 24 hours 02/29/24 1653     02/29/24 1530  cefTRIAXone  (ROCEPHIN ) 2 g in sodium chloride  0.9 % 100 mL IVPB        2 g 200 mL/hr over 30 Minutes Intravenous  Once 02/29/24 1515 02/29/24 1600       Current Facility-Administered Medications  Medication Dose Route Frequency Provider Last Rate Last Admin   0.9 %  sodium chloride  infusion   Intravenous Continuous Patel, Sona, MD 150 mL/hr at 03/02/24 0549 New Bag at 03/02/24 0549   acetaminophen  (TYLENOL ) tablet 650 mg  650 mg Oral Q6H PRN Paudel, Keshab, MD   650 mg at 03/02/24 9192   Or   acetaminophen  (TYLENOL ) suppository 650 mg  650 mg Rectal Q6H PRN Roann Gouty, MD       benzocaine  (ORAJEL) 10 % mucosal gel   Mouth/Throat QID PRN Duncan, Hazel V, MD   1 Application at 02/29/24 2244   cefTRIAXone  (ROCEPHIN ) 2 g in sodium chloride  0.9 % 100 mL IVPB  2 g Intravenous Q24H Paudel, Keshab, MD 200 mL/hr at 03/02/24 0552 2 g at 03/02/24 9447   Chlorhexidine  Gluconate Cloth 2 % PADS 6 each  6 each Topical Q0600 Paudel, Keshab, MD   6 each at 03/02/24 0557   feeding supplement (ENSURE PLUS HIGH PROTEIN) liquid 237 mL  237 mL Oral BID BM Paudel, Gouty, MD        midodrine  (PROAMATINE ) tablet 5 mg  5 mg Oral TID WC Patel, Sona, MD   5 mg at 03/02/24 9192   morphine  (PF) 2 MG/ML injection 2 mg  2 mg Intravenous Q4H PRN Paudel, Keshab, MD   2 mg at 03/01/24 0935   ondansetron  (ZOFRAN ) tablet 4 mg  4 mg Oral Q6H PRN Paudel, Keshab, MD       Or   ondansetron  (ZOFRAN ) injection 4 mg  4 mg Intravenous Q6H PRN Paudel, Gouty, MD       oxyCODONE  (Oxy IR/ROXICODONE ) immediate release tablet 5 mg  5 mg Oral Q4H PRN Paudel, Keshab, MD   5 mg at 03/02/24 0806   polyethylene glycol (MIRALAX  / GLYCOLAX ) packet 17 g  17 g Oral Daily PRN Paudel, Keshab, MD   17 g at 03/02/24 0808   Facility-Administered Medications Ordered in Other Encounters  Medication Dose Route Frequency Provider Last Rate Last Admin   0.9 %  sodium chloride  infusion   Intravenous Continuous Brahmanday, Govinda R, MD   Stopped at 09/11/20 1502   heparin  lock flush 100 unit/mL  500 Units Intravenous Once Rao, Archana C, MD       heparin  lock flush 100 unit/mL  500 Units Intravenous Once Rao, Archana C,  MD       methotrexate  (PF) 12 mg in sodium chloride  (PF) 0.9 % INTRATHECAL chemo injection   Intrathecal Once Rao, Archana C, MD       sodium chloride  flush (NS) 0.9 % injection 10 mL  10 mL Intracatheter PRN [name], MD   10 mL at 01/18/23 1545   sodium chloride  flush (NS) 0.9 % injection 10 mL  10 mL Intravenous Once [name], MD         Objective: Vital signs in last 24 hours: Temp:  [97.9 F (36.6 C)-100.7 F (38.2 C)] 99 F (37.2 C) (12/19 0836) Pulse Rate:  [65-107] 75 (12/19 0836) Resp:  [16-18] 16 (12/19 0836) BP: (79-150)/(46-123) 101/61 (12/19 0836) SpO2:  [85 %-100 %] 100 % (12/19 0836)  Intake/Output from previous day: 12/18 0701 - 12/19 0700 In: 3100.8 [P.O.:480; I.V.:2543.3; IV Piggyback:77.5] Out: 2875 [Urine:2875] Intake/Output this shift: No intake/output data recorded.  Physical Exam Vitals and nursing note reviewed.  Constitutional:      General: He is  not in acute distress.    Appearance: He is not ill-appearing, toxic-appearing or diaphoretic.  HENT:     Head: Normocephalic and atraumatic.  Pulmonary:     Effort: Pulmonary effort is normal. No respiratory distress.  Skin:    General: Skin is warm and dry.  Neurological:     Mental Status: He is alert and oriented to person, place, and time.  Psychiatric:        Mood and Affect: Mood normal.        Behavior: Behavior normal.    Lab Results:  Recent Labs    03/01/24 0606 03/02/24 0549  WBC 4.5 2.8*  HGB 10.0* 9.0*  HCT 29.0* 26.7*  PLT 117* 101*   BMET Recent Labs    03/01/24 0606 03/02/24 0549  NA 135 135  K 4.3 4.0  CL 101 106  CO2 22 19*  GLUCOSE 93 133*  BUN 34* 17  CREATININE 1.81* 1.21  CALCIUM 9.4 8.6*   PT/INR Recent Labs    03/01/24 0606  LABPROT 14.1  INR 1.0   Studies/Results: DG Chest Portable 1 View Result Date: 02/29/2024 CLINICAL DATA:  Weakness and hypotension. EXAM: PORTABLE CHEST 1 VIEW COMPARISON:  07/12/2020 FINDINGS: Right IJ central venous catheter looped over the right neck as seen previously with tip over the SVC. Surgical clips over the right neck base. Lungs are adequately inflated and otherwise clear. Cardiomediastinal silhouette and remainder of the exam is unchanged. IMPRESSION: 1. No acute cardiopulmonary disease. 2. Right IJ central venous catheter looped over the right neck without significant change. Electronically Signed   By: Toribio Agreste M.D.   On: 02/29/2024 13:49   Bladder Irrigation  Due to gross hematuria patient is present today for a bladder irrigation. Patient was cleaned and prepped in a sterile fashion. 200 mL of sterile water was instilled into the bladder with a 70mL Toomey syringe through the catheter in place.  of urine return was cleared from the bladder with evacuation of 5ccs of clot material. Efflux remained light red throughout the procedure and the catheter irrigated easily. Upon completion, the  catheter was draining well and was reattached to the night bag for drainage. Patient tolerated well.   Assessment & Plan: 63 y.o. male with PMH B cell lymphoma treated with chemotherapy including R-CHOP and cyclophosphamide ; and recent history of sensation of incomplete emptying, dysuria, gross hematuria, straining, and weak stream; and fossa navicularis stricture who  required Foley catheter placement by Dr. Francisca 6 days ago and is scheduled for cystoscopy under anesthesia with possible dilation versus biopsy with Dr. Twylla on 03/12/2024 now admitted with AKI, hypotension, and concern for sepsis due to UTI.  His gross hematuria has cleared somewhat compared to yesterday.  I irrigated his catheter this morning with clearance of only a small volume of clot fragments.  His hemoglobin has downtrended since admission, though I suspect this is dilutional especially in light of improvement in gross hematuria.  Agree with antibiotics given fever overnight, though may consider repeat urine culture to guide therapy.  Recommendations: -Antibiotics per primary team, consider repeat urine culture - Continue Foley catheter, manually irrigate as needed to clear obstructing clots - Trend CBC - Keep plans for cystoscopy under anesthesia with Dr. Twylla later this month  Lucie Hones, PA-C 03/02/2024

## 2024-03-02 NOTE — Progress Notes (Signed)
 Received message from pt's daughter, Holli, with urgent concerns about pain medication. Returned phone call and daughter was concerned that the patient was to stop taking oxybutin. Confirmed that the medication in question was for bladder spasms not pain control. Verified with pt's daughter that the patient could continue taking the prescribed Percocet. Denies further questions.

## 2024-03-02 NOTE — Progress Notes (Signed)
 Pt to go home with foley catheter. Pt refused to change bag to a leg bag stating he knew how to empty this one and didn't like the other one. Pt's daughter at bedside and was able to verbalize how to empty current bag. Educated to follow up with urology with his scheduled appointment. Verbalizes understanding.

## 2024-03-02 NOTE — Discharge Summary (Signed)
 " Physician Discharge Summary   Patient: Christopher Mills MRN: [id number] DOB: 1960/11/08  Admit date:     02/29/2024  Discharge date: 03/02/2024  Discharge Physician: Leita Blanch   PCP: Pcp, No   Recommendations at discharge:   patient will follow-up with Dr. Twylla urology on his scheduled appointment 29th of December follow-up with PCP 22 weeks continue Foley catheter with Foley care instructions as before  Discharge Diagnoses: Principal Problem:   AKI (acute kidney injury) Active Problems:   High grade B-cell lymphoma (HCC)   Sepsis secondary to UTI (HCC)   Foley catheter in place   Gross hematuria   Hypotension  Christopher Mills is a pleasant 63 y.o. male with medical history significant for HTN and B-cell lymphoma treated with cyclophosphamide  who came into ED after he presented to preop area with low blood pressure.  Patient had dysuria, straining and gross hematuria when he presented to ED on 02/25/2024.  He was evaluated by urology and they advised that patient could have hemorrhagic cystitis from cyclophosphamide , stricture disease, BPH or even malignancy.  They were planning on doing the cystoscopy and was scheduled for the OR 12/29 or clinic cystoscopy earlier.  Patient was discharged home on oxybutynin  10 mg XL daily for bladder spasm, continue Foley catheter and remove Foley catheter in the clinic later this week. While he was at the preop area today 12/17, patient was found to be hypotensive weak, dizzy with double vision.  Patient was taken to ED for further evaluation of hypotension. Patient denies any fever, chills, cough, shortness of breath, nausea, vomiting, diarrhea.  However, he complains of ongoing dysuria and hematuria on Foley catheter placed 4 days ago in the emergency room.   AKI-- appears obstructive uropathy with significant elevated creatinine - Patient received 3 L of IV fluid in the emergency room. - I will continue him on maintenance IV fluid 150 cc/h -  .Came in with creatinine of 3.1-- 1.8--1.2 -- patient was seen by urology and patient had clot retention in the urethra which was removed. Good urine output since  however continues with bloody urine - His urine still shows gross UTI-- urine culture pending - He was given ceftriaxone  in the emergency room and cultures were sent. - I will continue ceftriaxone  and follow-up cultures. -- Urology plans to continue monitoring and patient scheduled to get procedure on 29th December with Dr. Twylla --h/h is stable -- okay from urology standpoint for discharge today   Sepsis secondary to UTI present on admission - Patient was given ceftriaxone  in the emergency room. - Will continue ceftriaxone  and IV fluid. -Blood cultures so far negative -- urine culture-- negative. --Lactic acid down to 1.6 -- no indication to continue further antibiotics. Patient and daughter were explained voiced understanding. Urology agrees with plan    History of hypertension Relative hypotension - It appears that patient was taking lisinopril  10 mg daily and atenolol  25 mg daily. - Those medications will be held at this point and restarted when blood pressure is better. --started midodrine -- looking at notes from the recent ER visit and outpatient recently patient has been running low blood pressure and he acknowledges that. Systolic anywhere from 80 to 100. -- Patient denies any dizziness or lightheadedness with soft blood pressure.  --Manual BP taken durign rounds was 104 systolic -- blood pressure much improved. Will continue to hold BP meds for home. He can resume at one systolic greater than 130.   History of diffuse B-cell lymphoma --  patient followed by Dr. Melanee at the cancer center    Discharge to home with outpatient follow-up urology   Procedures: clot retention in the urethra that was removed by urology PA at bedside Family communication : daughter Consults : urology CODE STATUS: full DVT Prophylaxis :  SCD    Pain control - [place]  Controlled Substance Reporting System database was reviewed. and patient was instructed, not to drive, operate heavy machinery, perform activities at heights, swimming or participation in water activities or provide baby-sitting services while on Pain, Sleep and Anxiety Medications; until their outpatient Physician has advised to do so again. Also recommended to not to take more than prescribed Pain, Sleep and Anxiety Medications.  Disposition: Home Diet recommendation:  Regular diet DISCHARGE MEDICATION: Allergies as of 03/02/2024   No Known Allergies      Medication List     PAUSE taking these medications    atenolol  25 MG tablet Wait to take this until your doctor or other care provider tells you to start again. Commonly known as: TENORMIN  Take 1 tablet (25 mg total) by mouth daily. What changed: when to take this   lisinopril  10 MG tablet Wait to take this until your doctor or other care provider tells you to start again. Commonly known as: ZESTRIL  Take 1 tablet (10 mg total) by mouth daily. What changed: when to take this       STOP taking these medications    cefUROXime  250 MG tablet Commonly known as: CEFTIN    oxybutynin  10 MG 24 hr tablet Commonly known as: Ditropan  XL   phenazopyridine  100 MG tablet Commonly known as: Pyridium        TAKE these medications    ascorbic acid 500 MG tablet Commonly known as: VITAMIN C Take 500 mg by mouth daily.   cholecalciferol 25 MCG (1000 UNIT) tablet Commonly known as: VITAMIN D3 Take 1,000 Units by mouth daily.   FISH OIL ADULT GUMMIES PO Take 1 tablet by mouth daily at 6 (six) AM.   midodrine  5 MG tablet Commonly known as: PROAMATINE  Take 1 tablet (5 mg total) by mouth 3 (three) times daily with meals.   oxyCODONE -acetaminophen  5-325 MG tablet Commonly known as: PERCOCET/ROXICET Take 1 tablet by mouth every 4 (four) hours as needed for severe pain (pain score 7-10).         Follow-up Information     Twylla Glendia BROCKS, MD. Go on 03/12/2024.   Specialty: Urology Why: call the urology office if you have any concerns/issue Contact information: [address] [place] [address] [phone]                Discharge Exam: Fredricka Weights   02/29/24 1154  Weight: 70.6 kg   Alert and oriented times three respiratory clear to auscultation Foley catheter with blood-tinged urine  Condition at discharge: fair  The results of significant diagnostics from this hospitalization (including imaging, microbiology, ancillary and laboratory) are listed below for reference.   Imaging Studies: DG Chest Portable 1 View Result Date: 02/29/2024 CLINICAL DATA:  Weakness and hypotension. EXAM: PORTABLE CHEST 1 VIEW COMPARISON:  07/12/2020 FINDINGS: Right IJ central venous catheter looped over the right neck as seen previously with tip over the SVC. Surgical clips over the right neck base. Lungs are adequately inflated and otherwise clear. Cardiomediastinal silhouette and remainder of the exam is unchanged. IMPRESSION: 1. No acute cardiopulmonary disease. 2. Right IJ central venous catheter looped over the right neck without significant change. Electronically  Signed   By: Toribio Agreste M.D.   On: 02/29/2024 13:49    Microbiology: Results for orders placed or performed during the hospital encounter of 02/29/24  Culture, blood (routine x 2)     Status: None (Preliminary result)   Collection Time: 02/29/24 12:01 PM   Specimen: BLOOD  Result Value Ref Range Status   Specimen Description BLOOD BLOOD LEFT FOREARM  Final   Special Requests   Final    BOTTLES DRAWN AEROBIC AND ANAEROBIC Blood Culture results may not be optimal due to an inadequate volume of blood received in culture bottles   Culture   Final    NO GROWTH 2 DAYS Performed at [facility], [address], [place], [place] [address]    Report Status PENDING  Incomplete   Urine Culture     Status: None   Collection Time: 02/29/24  2:55 PM   Specimen: Urine, Catheterized  Result Value Ref Range Status   Specimen Description   Final    URINE, CATHETERIZED Performed at [facility], [address] [place], [place] [address]    Special Requests   Final    NONE Performed at [facility], [address], [place], [place] [address]    Culture   Final    NO GROWTH Performed at [facility], [address], [place], [place] [address]    Report Status 03/01/2024 FINAL  Final  Culture, blood (routine x 2)     Status: None (Preliminary result)   Collection Time: 02/29/24  8:03 PM   Specimen: BLOOD  Result Value Ref Range Status   Specimen Description BLOOD BLOOD RIGHT FOREARM  Final   Special Requests   Final    BOTTLES DRAWN AEROBIC AND ANAEROBIC Blood Culture adequate volume   Culture   Final    NO GROWTH 2 DAYS Performed at [facility], [address] [place], [place] [address]    Report Status PENDING  Incomplete    Labs: CBC: Recent Labs  Lab 02/25/24 1611 02/29/24 1201 02/29/24 2003 03/01/24 0606 03/02/24 0549  WBC 7.9 9.0 5.5 4.5 2.8*  NEUTROABS  --  6.7  --   --   --   HGB 12.5* 12.2* 11.0* 10.0* 9.0*  HCT 37.7* 36.2* 31.6* 29.0* 26.7*  MCV 93.3 91.0 89.3 88.7 91.1  PLT 153 166 119* 117* 101*   Basic Metabolic Panel: Recent Labs  Lab 02/25/24 1611 02/29/24 1201 02/29/24 2003 03/01/24 0606 03/02/24 0549  NA 133* 131*  --  135 135  K 4.8 4.1  --  4.3 4.0  CL 99 95*  --  101 106  CO2 22 20*  --  22 19*  GLUCOSE 95 122*  --  93 133*  BUN 20 43*  --  34* 17  CREATININE 1.44* 3.11* 2.53* 1.81* 1.21  CALCIUM 11.1* 10.7*  --  9.4 8.6*   Liver Function Tests: Recent Labs  Lab 02/29/24 1201  AST 98*  ALT 54*  ALKPHOS 131*  BILITOT 0.8  PROT 7.1  ALBUMIN 3.8   CBG: No results for input(s): GLUCAP in the last 168 hours.  Discharge time spent: greater than 30  minutes.  Signed: Leita Blanch, MD [facility] 03/02/2024 "

## 2024-03-05 LAB — CULTURE, BLOOD (ROUTINE X 2)
Culture: NO GROWTH
Culture: NO GROWTH
Special Requests: ADEQUATE

## 2024-03-06 ENCOUNTER — Telehealth: Payer: Self-pay

## 2024-03-06 ENCOUNTER — Other Ambulatory Visit: Payer: Self-pay | Admitting: Oncology

## 2024-03-06 ENCOUNTER — Encounter: Payer: Self-pay | Admitting: Urology

## 2024-03-06 DIAGNOSIS — C8510 Unspecified B-cell lymphoma, unspecified site: Secondary | ICD-10-CM

## 2024-03-06 NOTE — Telephone Encounter (Signed)
 Voicemail received from [place] yesterday 03/05/24 at 12:10pm requesting a call back at [phone]; no other details indicated.  Outbound call; spoke to patient who says he needs to reschedule his infusion scheduled 12/29 @ 9am; says he's having urethral surgery done on that day.  Patient would like to postpone treatment until after his surgery; also mentioned he has a colonoscopy coming up in January.  Informed patient I would inform the provider of above. Patient confirmed he checks his my chart; advised him to keep an eye out for when his infusion will be rescheduled.  Patient verbalized understanding.

## 2024-03-06 NOTE — Patient Instructions (Addendum)
 Your procedure is scheduled on:03-12-24 Monday Report to the Registration Desk on the 1st floor of the [facility].Then proceed to the 2nd floor Surgery Desk To find out your arrival time, please call [phone] between 1PM - 3PM on:03-09-24 Friday If your arrival time is 6:00 am, do not arrive before that time as the [facility] entrance doors do not open until 6:00 am.  REMEMBER: Instructions that are not followed completely may result in serious medical risk, up to and including death; or upon the discretion of your surgeon and anesthesiologist your surgery may need to be rescheduled.  Do not eat food OR drink liquids after midnight the night before surgery.  No gum chewing or hard candies.  One week prior to surgery:Stop NOW (03-06-24) Tuesday Stop Anti-inflammatories (NSAIDS) such as Advil, Aleve, Ibuprofen, Motrin, Naproxen, Naprosyn and Aspirin  based products such as Excedrin, Goody's Powder, BC Powder. Stop ANY OVER THE COUNTER supplements until after surgery (Vitamin D3, Fish Oil, Vitamin C)  You may however, continue to take Tylenol  if needed for pain up until the day of surgery.  Continue taking all of your other prescription medications up until the day of surgery.  ON THE DAY OF SURGERY ONLY TAKE THESE MEDICATIONS WITH SIPS OF WATER: -You may take Percocet (oxyCODONE -acetaminophen ) for pain if needed  Bring your midodrine  (PROAMATINE ) bottle to the hospital the day of surgery in case your blood pressure is low and anesthesia wants you to take it   No Alcohol for 24 hours before or after surgery.  No Smoking including e-cigarettes for 24 hours before surgery.  No chewable tobacco products for at least 6 hours before surgery.  No nicotine patches on the day of surgery.  Do not use any recreational drugs for at least a week (preferably 2 weeks) before your surgery.  Please be advised that the combination of cocaine and anesthesia may have negative outcomes, up to and  including death. If you test positive for cocaine, your surgery will be cancelled.  On the morning of surgery brush your teeth with toothpaste and water, you may rinse your mouth with mouthwash if you wish. Do not swallow any toothpaste or mouthwash.  Do not wear jewelry, make-up, hairpins, clips or nail polish.  For welded (permanent) jewelry: bracelets, anklets, waist bands, etc.  Please have this removed prior to surgery.  If it is not removed, there is a chance that hospital personnel will need to cut it off on the day of surgery.  Do not wear lotions, powders, or perfumes.   Do not shave body hair from the neck down 48 hours before surgery.  Contact lenses, hearing aids and dentures may not be worn into surgery.  Do not bring valuables to the hospital. [facility] is not responsible for any missing/lost belongings or valuables.    Notify your doctor if there is any change in your medical condition (cold, fever, infection).  Wear comfortable clothing (specific to your surgery type) to the hospital.  After surgery, you can help prevent lung complications by doing breathing exercises.  Take deep breaths and cough every 1-2 hours. Your doctor may order a device called an Incentive Spirometer to help you take deep breaths. When coughing or sneezing, hold a pillow firmly against your incision with both hands. This is called splinting. Doing this helps protect your incision. It also decreases belly discomfort.  If you are being admitted to the hospital overnight, leave your suitcase in the car. After surgery it may be  brought to your room.  In case of increased patient census, it may be necessary for you, the patient, to continue your postoperative care in the Same Day Surgery department.  If you are being discharged the day of surgery, you will not be allowed to drive home. You will need a responsible individual to drive you home and stay with you for 24 hours after surgery.   If  you are taking public transportation, you will need to have a responsible individual with you.  Please call the Pre-admissions Testing Dept. at [phone] if you have any questions about these instructions.  Surgery Visitation Policy:  Patients having surgery or a procedure may have two visitors.  Children under the age of 46 must have an adult with them who is not the patient.   Merchandiser, Retail to address health-related social needs:  [email or web address]

## 2024-03-06 NOTE — Progress Notes (Signed)
 Called pt to f/u since pt had gone from PAT on 12-17 to the ED due to low bp- Reviewed medications again. Pt states he never started Midodrine  at home due to bp normalizing after he was told to stop his bp medications. Pt states his bp at home today is 107/64. I instructed pt to bring his midodrine  bottle to hospital dos. Pt verbalized understanding. Pt states he is still having trouble with his catheter feeling like the urine does not want to come and then he strains and urine leaks around the catheter. Pt states he has urine in his bag with some clots. Pt states he has called Dr Tex office and never got a return call back. I told pt to call again now and he can also my chart Dr Twylla. Pt verbalized understanding

## 2024-03-06 NOTE — Telephone Encounter (Signed)
 I have deferred his rx to 03/26/24

## 2024-03-10 ENCOUNTER — Emergency Department
Admission: EM | Admit: 2024-03-10 | Discharge: 2024-03-10 | Disposition: A | Discharge status: Home / Self Care | Attending: Emergency Medicine | Admitting: Emergency Medicine

## 2024-03-10 ENCOUNTER — Other Ambulatory Visit: Payer: Self-pay

## 2024-03-10 DIAGNOSIS — R319 Hematuria, unspecified: Secondary | ICD-10-CM | POA: Insufficient documentation

## 2024-03-10 DIAGNOSIS — R39198 Other difficulties with micturition: Secondary | ICD-10-CM | POA: Insufficient documentation

## 2024-03-10 DIAGNOSIS — Y732 Prosthetic and other implants, materials and accessory gastroenterology and urology devices associated with adverse incidents: Secondary | ICD-10-CM | POA: Insufficient documentation

## 2024-03-10 DIAGNOSIS — T83098A Other mechanical complication of other indwelling urethral catheter, initial encounter: Secondary | ICD-10-CM | POA: Diagnosis present

## 2024-03-10 DIAGNOSIS — R531 Weakness: Secondary | ICD-10-CM | POA: Diagnosis not present

## 2024-03-10 DIAGNOSIS — I10 Essential (primary) hypertension: Secondary | ICD-10-CM | POA: Diagnosis not present

## 2024-03-10 DIAGNOSIS — R42 Dizziness and giddiness: Secondary | ICD-10-CM | POA: Insufficient documentation

## 2024-03-10 DIAGNOSIS — T839XXA Unspecified complication of genitourinary prosthetic device, implant and graft, initial encounter: Secondary | ICD-10-CM

## 2024-03-10 LAB — CBC WITH DIFFERENTIAL/PLATELET
Abs Immature Granulocytes: 0.06 K/uL (ref 0.00–0.07)
Basophils Absolute: 0.1 K/uL (ref 0.0–0.1)
Basophils Relative: 2 %
Eosinophils Absolute: 0.1 K/uL (ref 0.0–0.5)
Eosinophils Relative: 1 %
HCT: 35.1 % — ABNORMAL LOW (ref 39.0–52.0)
Hemoglobin: 11.3 g/dL — ABNORMAL LOW (ref 13.0–17.0)
Immature Granulocytes: 1 %
Lymphocytes Relative: 36 %
Lymphs Abs: 1.6 K/uL (ref 0.7–4.0)
MCH: 29.6 pg (ref 26.0–34.0)
MCHC: 32.2 g/dL (ref 30.0–36.0)
MCV: 91.9 fL (ref 80.0–100.0)
Monocytes Absolute: 0.3 K/uL (ref 0.1–1.0)
Monocytes Relative: 6 %
Neutro Abs: 2.5 K/uL (ref 1.7–7.7)
Neutrophils Relative %: 54 %
Platelets: 239 K/uL (ref 150–400)
RBC: 3.82 MIL/uL — ABNORMAL LOW (ref 4.22–5.81)
RDW: 14.5 % (ref 11.5–15.5)
Smear Review: NORMAL
WBC: 4.6 K/uL (ref 4.0–10.5)
nRBC: 0 % (ref 0.0–0.2)

## 2024-03-10 LAB — BASIC METABOLIC PANEL WITH GFR
Anion gap: 15 (ref 5–15)
BUN: 8 mg/dL (ref 8–23)
CO2: 23 mmol/L (ref 22–32)
Calcium: 10 mg/dL (ref 8.9–10.3)
Chloride: 99 mmol/L (ref 98–111)
Creatinine, Ser: 0.96 mg/dL (ref 0.61–1.24)
GFR, Estimated: >60 mL/min
Glucose, Bld: 95 mg/dL (ref 70–99)
Potassium: 3.8 mmol/L (ref 3.5–5.1)
Sodium: 137 mmol/L (ref 135–145)

## 2024-03-10 LAB — LACTIC ACID, PLASMA
Lactic Acid, Venous: 2.2 mmol/L (ref 0.5–1.9)
Lactic Acid, Venous: 1.6 mmol/L (ref 0.5–1.9)

## 2024-03-10 MED ORDER — CEPHALEXIN 500 MG PO CAPS
500.0000 mg | ORAL_CAPSULE | Freq: Once | ORAL | Status: AC
  Administered 2024-03-10: 500 mg via ORAL
  Filled 2024-03-10: qty 1

## 2024-03-10 MED ORDER — CEPHALEXIN 500 MG PO CAPS: 500.0000 mg | ORAL_CAPSULE | Freq: Two times a day (BID) | ORAL | 0 refills | Status: AC

## 2024-03-10 MED ORDER — SODIUM CHLORIDE 0.9 % IV BOLUS
1000.0000 mL | Freq: Once | INTRAVENOUS | Status: AC
  Administered 2024-03-10: 1000 mL via INTRAVENOUS

## 2024-03-10 MED ORDER — LIDOCAINE 5 % EX OINT: 1.0000 | TOPICAL_OINTMENT | Freq: Three times a day (TID) | CUTANEOUS | 0 refills | Status: AC | PRN

## 2024-03-10 MED ORDER — BACITRACIN 500 UNIT/GM EX OINT: 1.0000 | TOPICAL_OINTMENT | Freq: Two times a day (BID) | CUTANEOUS | 0 refills | Status: DC

## 2024-03-10 MED ORDER — LIDOCAINE 5 % EX OINT
TOPICAL_OINTMENT | Freq: Once | CUTANEOUS | Status: AC
  Filled 2024-03-10: qty 35.44

## 2024-03-10 NOTE — ED Provider Notes (Signed)
 "  [facility] Provider Note    Event Date/Time   First MD Initiated Contact with Patient 03/10/24 1101     (approximate)   History   catheter problem (foley)   HPI  Christopher Mills is a 63 y.o. male with history of hypertension and B-cell lymphoma who presents with difficulty with Foley catheter.  He states that he has had a Foley catheter in place for several weeks that was recently replaced.  He is scheduled for a procedure with urology on 12/29.  He states that over the last several days, he has had increased pain and difficulty with urination.  He states that he frequently will have clumps or clots in the urine, and the urine remains bloody.  Some of his urine flows freely but sometimes it appears to be stuck.  He has to strain to try to urinate and the urine will come out around the Foley catheter.  He reports pain and swelling to the tip of the penis as well as some discharge.  He also reports feeling somewhat weak over the last several days, and is lightheaded when he stands up.  Denies any fevers.  He has no abdominal pain.  I reviewed the past medical records.  The patient was just admitted to the hospitalist service and discharged on 12/19.  He initially presented to the ED on 12/13 with gross hematuria.  He was diagnosed with hemorrhagic cystitis.  When he returned to the hospital on 12/17 he was diagnosed with AKI likely due to obstructive uropathy, as well as sepsis secondary to UTI.  Antibiotics were discontinued upon discharge.  He is scheduled for a cystoscopy under anesthesia with possible dilation on 12/29.   Physical Exam   Triage Vital Signs: ED Triage Vitals  Encounter Vitals Group     BP 03/10/24 1035 93/69     Girls Systolic BP Percentile --      Girls Diastolic BP Percentile --      Boys Systolic BP Percentile --      Boys Diastolic BP Percentile --      Pulse Rate 03/10/24 1035 (!) 112     Resp 03/10/24 1035 20     Temp 03/10/24 1035 98  F (36.7 C)     Temp Source 03/10/24 1035 Oral     SpO2 03/10/24 1035 99 %     Weight 03/10/24 1037 155 lb 10.3 oz (70.6 kg)     Height 03/10/24 1037 6' 1 (1.854 m)     Head Circumference --      Peak Flow --      Pain Score 03/10/24 1036 9     Pain Loc --      Pain Education --      Exclude from Growth Chart --     Most recent vital signs: Vitals:   03/10/24 1154 03/10/24 1331  BP:  104/68  Pulse:  84  Resp:  18  Temp:  97.8 F (36.6 C)  SpO2: 99% 100%     General: Awake, no distress.  CV:  Good peripheral perfusion.  Resp:  Normal effort.  Abd:  Soft and nontender.  No distention.  Other:  Normal external genitalia.  Foley catheter in place.  Irritation to the penile meatus with some purulent appearing discharge.  No erythema, induration, or abnormal warmth.  Pinkish urine in the bag, with no visible clots.   ED Results / Procedures / Treatments   Labs (all labs ordered are  listed, but only abnormal results are displayed) Labs Reviewed  CBC WITH DIFFERENTIAL/PLATELET - Abnormal; Notable for the following components:      Result Value   RBC 3.82 (*)    Hemoglobin 11.3 (*)    HCT 35.1 (*)    All other components within normal limits  LACTIC ACID, PLASMA - Abnormal; Notable for the following components:   Lactic Acid, Venous 2.2 (*)    All other components within normal limits  BASIC METABOLIC PANEL WITH GFR  LACTIC ACID, PLASMA     EKG    RADIOLOGY    PROCEDURES:  Critical Care performed: No  Procedures   MEDICATIONS ORDERED IN ED: Medications  lidocaine  (XYLOCAINE ) 5 % ointment (has no administration in time range)  sodium chloride  0.9 % bolus 1,000 mL (0 mLs Intravenous Stopped 03/10/24 1553)  cephALEXin  (KEFLEX ) capsule 500 mg (500 mg Oral Given 03/10/24 1745)     IMPRESSION / MDM / ASSESSMENT AND PLAN / ED COURSE  I reviewed the triage vital signs and the nursing notes.  64 year old male with PMH as noted above presents with worsening  urinary symptoms as well as some generalized weakness and lightheadedness over the last several days in the context of a Foley catheter that has been in place for the last several weeks.  He is scheduled for cystoscopy under anesthesia on 12/29, 2 days from now.  He was tachycardic and borderline hypotensive in triage.  Differential diagnosis includes, but is not limited to, urinary obstruction, Foley catheter malfunction, cystitis, malignancy, AKI, acute infection/sepsis.  We will obtain lab workup, consult urology, and reassess.  There is no indication for urinalysis since the urine is grossly bloody.  Patient's presentation is most consistent with acute presentation with potential threat to life or bodily function.  ----------------------------------------- 5:40 PM on 03/10/2024 -----------------------------------------  Lab workup is overall reassuring.  BMP and CBC were unremarkable.  Hemoglobin is improved from his recent admission.  Initial lactate was minimally elevated.  There is no clinical evidence of sepsis.  The patient's vital signs are normal.  I feel that is more consistent with dehydration.  We gave a fluid bolus and the repeat lactate is now normal.  I consulted Dr. Cam from urology who recommended starting the patient on an antibiotic just in case he was having the beginnings of cystitis to make sure that the procedure did not end up getting canceled.  However he otherwise agreed with the current management.  He felt that there was no indication to replace the Foley.  He agreed with prescribing a topical anesthetic for the penile meatus as well as bacitracin , although he advised that the discharge was consistent with normal mucus.  I counseled the patient on the results of the workup and plan of care, and he is in agreement.  He feels comfortable and is eager to go home.  I answered all of his questions.  I gave strict return precautions, and he expressed understanding.  FINAL  CLINICAL IMPRESSION(S) / ED DIAGNOSES   Final diagnoses:  Problem with Foley catheter, initial encounter  Hematuria, unspecified type     Rx / DC Orders   ED Discharge Orders          Ordered    cephALEXin  (KEFLEX ) 500 MG capsule  2 times daily        03/10/24 1741    bacitracin  500 UNIT/GM ointment  2 times daily        03/10/24 1741    lidocaine  (XYLOCAINE )  5 % ointment  3 times daily PRN        03/10/24 1741             Note:  This document was prepared using Dragon voice recognition software and may include unintentional dictation errors.    Jacolyn Pae, MD 03/10/24 1753  "

## 2024-03-10 NOTE — ED Notes (Signed)
 Latic critical  2.2

## 2024-03-10 NOTE — Discharge Instructions (Signed)
 Follow-up with the urologist for the procedure on Monday as scheduled.  Take the Keflex  for the next 3 days to cover for any possible bladder infection that could be brewing.  You may use the bacitracin  ointment up to twice daily to help prevent infection at the tip of the penis.  Use the lidocaine  ointment up to 3 times daily for numbing.  Return to the ER immediately for new, worsening, or persistent severe pain, if there is no urine flowing from the catheter, if you have swelling or pain to the lower abdomen, fever, vomiting, or any other new or worsening symptoms that concern you.

## 2024-03-10 NOTE — ED Triage Notes (Signed)
 Pt to ED for foley catheter problem. States pain and swelling to tip of penis since 4 days ago. Catheter was originally placed about 2 weeks ago and then replaced with larger one about 1 week ago. States has upcoming urethral surgery for this coming week. Urine in collection bag has been bloody this whole time. Has been taking prescribed percocet as needed and also abx (finished).

## 2024-03-12 ENCOUNTER — Inpatient Hospital Stay: Admitting: Oncology

## 2024-03-12 ENCOUNTER — Ambulatory Visit: Payer: Self-pay | Admitting: Urgent Care

## 2024-03-12 ENCOUNTER — Other Ambulatory Visit: Payer: Self-pay

## 2024-03-12 ENCOUNTER — Encounter: Payer: Self-pay | Admitting: Urology

## 2024-03-12 ENCOUNTER — Encounter: Payer: Self-pay | Admitting: Oncology

## 2024-03-12 ENCOUNTER — Encounter
Admission: RE | Disposition: A | Discharge status: Home / Self Care | Payer: Self-pay | Source: Home / Self Care | Attending: Urology

## 2024-03-12 ENCOUNTER — Inpatient Hospital Stay

## 2024-03-12 ENCOUNTER — Observation Stay
Admission: RE | Admit: 2024-03-12 | Discharge: 2024-03-13 | Disposition: A | Discharge status: Home / Self Care | Attending: Urology | Admitting: Urology

## 2024-03-12 DIAGNOSIS — I959 Hypotension, unspecified: Secondary | ICD-10-CM | POA: Diagnosis present

## 2024-03-12 DIAGNOSIS — N368 Other specified disorders of urethra: Secondary | ICD-10-CM

## 2024-03-12 DIAGNOSIS — I10 Essential (primary) hypertension: Secondary | ICD-10-CM | POA: Insufficient documentation

## 2024-03-12 DIAGNOSIS — N3091 Cystitis, unspecified with hematuria: Principal | ICD-10-CM | POA: Diagnosis present

## 2024-03-12 DIAGNOSIS — Z87891 Personal history of nicotine dependence: Secondary | ICD-10-CM | POA: Insufficient documentation

## 2024-03-12 DIAGNOSIS — R8281 Pyuria: Secondary | ICD-10-CM

## 2024-03-12 DIAGNOSIS — N3090 Cystitis, unspecified without hematuria: Secondary | ICD-10-CM

## 2024-03-12 DIAGNOSIS — R310 Gross hematuria: Secondary | ICD-10-CM

## 2024-03-12 HISTORY — PX: CYSTOSCOPY: SHX5120

## 2024-03-12 HISTORY — DX: Presence of other vascular implants and grafts: Z95.828

## 2024-03-12 HISTORY — DX: Diffuse large B-cell lymphoma, unspecified site: C83.30

## 2024-03-12 HISTORY — DX: Presence of other specified devices: Z97.8

## 2024-03-12 HISTORY — PX: CYSTOSCOPY WITH FULGERATION: SHX6638

## 2024-03-12 HISTORY — PX: CYSTOSCOPY WITH BIOPSY: SHX5122

## 2024-03-12 SURGERY — CYSTOSCOPY
Anesthesia: General

## 2024-03-12 MED ORDER — LACTATED RINGERS IV SOLN: INTRAVENOUS | Status: DC

## 2024-03-12 MED ORDER — ADULT MULTIVITAMIN W/MINERALS CH
1.0000 | ORAL_TABLET | Freq: Every day | ORAL | Status: DC
  Administered 2024-03-13: 1 via ORAL
  Filled 2024-03-12: qty 1

## 2024-03-12 MED ORDER — OXYCODONE HCL 5 MG PO TABS
ORAL_TABLET | ORAL | Status: AC
  Filled 2024-03-12: qty 1

## 2024-03-12 MED ORDER — FENTANYL CITRATE (PF) 100 MCG/2ML IJ SOLN
INTRAMUSCULAR | Status: AC
  Filled 2024-03-12: qty 2

## 2024-03-12 MED ORDER — MIDAZOLAM HCL (PF) 2 MG/2ML IJ SOLN
INTRAMUSCULAR | Status: DC | PRN
  Administered 2024-03-12: 2 mg via INTRAVENOUS

## 2024-03-12 MED ORDER — DEXMEDETOMIDINE HCL IN NACL 80 MCG/20ML IV SOLN
INTRAVENOUS | Status: DC | PRN
  Administered 2024-03-12 (×2): 8 ug via INTRAVENOUS

## 2024-03-12 MED ORDER — SODIUM CHLORIDE 0.9 % IR SOLN
3000.0000 mL | Status: DC
  Administered 2024-03-12: 3000 mL

## 2024-03-12 MED ORDER — CEFAZOLIN SODIUM-DEXTROSE 2-4 GM/100ML-% IV SOLN
2.0000 g | INTRAVENOUS | Status: AC
  Administered 2024-03-12: 2 g via INTRAVENOUS

## 2024-03-12 MED ORDER — LACTATED RINGERS IV SOLN: INTRAVENOUS | Status: DC | PRN

## 2024-03-12 MED ORDER — ATENOLOL 25 MG PO TABS
25.0000 mg | ORAL_TABLET | Freq: Every day | ORAL | Status: DC
  Administered 2024-03-13: 25 mg via ORAL
  Filled 2024-03-12: qty 1

## 2024-03-12 MED ORDER — OXYCODONE-ACETAMINOPHEN 5-325 MG PO TABS: 1.0000 | ORAL_TABLET | ORAL | Status: DC | PRN

## 2024-03-12 MED ORDER — ACETAMINOPHEN 10 MG/ML IV SOLN
INTRAVENOUS | Status: DC | PRN
  Administered 2024-03-12: 1000 mg via INTRAVENOUS

## 2024-03-12 MED ORDER — ENSURE PLUS HIGH PROTEIN PO LIQD
237.0000 mL | Freq: Three times a day (TID) | ORAL | Status: DC
  Administered 2024-03-12: 237 mL via ORAL

## 2024-03-12 MED ORDER — FENTANYL CITRATE (PF) 100 MCG/2ML IJ SOLN
INTRAMUSCULAR | Status: DC | PRN
  Administered 2024-03-12 (×2): 50 ug via INTRAVENOUS

## 2024-03-12 MED ORDER — VITAMIN D 25 MCG (1000 UNIT) PO TABS
1000.0000 [IU] | ORAL_TABLET | Freq: Every day | ORAL | Status: DC
  Administered 2024-03-13: 1000 [IU] via ORAL
  Filled 2024-03-12: qty 1

## 2024-03-12 MED ORDER — STERILE WATER FOR IRRIGATION IR SOLN
Status: DC | PRN
  Administered 2024-03-12: 1000 mL

## 2024-03-12 MED ORDER — CHLORHEXIDINE GLUCONATE 0.12 % MT SOLN
15.0000 mL | Freq: Once | OROMUCOSAL | Status: AC
  Administered 2024-03-12: 15 mL via OROMUCOSAL

## 2024-03-12 MED ORDER — MIDAZOLAM HCL 2 MG/2ML IJ SOLN
INTRAMUSCULAR | Status: AC
  Filled 2024-03-12: qty 2

## 2024-03-12 MED ORDER — STERILE WATER FOR IRRIGATION IR SOLN
Status: DC | PRN
  Administered 2024-03-12: 3000 mL

## 2024-03-12 MED ORDER — SODIUM CHLORIDE 0.9 % IR SOLN
Status: DC | PRN
  Administered 2024-03-12 (×2): 3000 mL
  Administered 2024-03-12: 9000 mL

## 2024-03-12 MED ORDER — CHLORHEXIDINE GLUCONATE 0.12 % MT SOLN
OROMUCOSAL | Status: AC
  Filled 2024-03-12: qty 15

## 2024-03-12 MED ORDER — LIDOCAINE HCL (PF) 2 % IJ SOLN
INTRAMUSCULAR | Status: AC
  Filled 2024-03-12: qty 5

## 2024-03-12 MED ORDER — CEPHALEXIN 500 MG PO CAPS
500.0000 mg | ORAL_CAPSULE | Freq: Two times a day (BID) | ORAL | Status: DC
  Administered 2024-03-12 – 2024-03-13 (×2): 500 mg via ORAL
  Filled 2024-03-12 (×2): qty 1

## 2024-03-12 MED ORDER — VITAMIN C 500 MG PO TABS
500.0000 mg | ORAL_TABLET | Freq: Every day | ORAL | Status: DC
  Administered 2024-03-13: 500 mg via ORAL
  Filled 2024-03-12: qty 1

## 2024-03-12 MED ORDER — PHENYLEPHRINE 80 MCG/ML (10ML) SYRINGE FOR IV PUSH (FOR BLOOD PRESSURE SUPPORT)
PREFILLED_SYRINGE | INTRAVENOUS | Status: DC | PRN
  Administered 2024-03-12 (×3): 80 ug via INTRAVENOUS

## 2024-03-12 MED ORDER — PROPOFOL 10 MG/ML IV BOLUS
INTRAVENOUS | Status: DC | PRN
  Administered 2024-03-12: 100 mg via INTRAVENOUS
  Administered 2024-03-12: 150 ug/kg/min via INTRAVENOUS

## 2024-03-12 MED ORDER — LACTATED RINGERS IV SOLN: INTRAVENOUS | Status: AC

## 2024-03-12 MED ORDER — SODIUM CHLORIDE 0.9 % IV BOLUS
500.0000 mL | Freq: Once | INTRAVENOUS | Status: AC
  Administered 2024-03-12: 500 mL via INTRAVENOUS

## 2024-03-12 MED ORDER — ACETAMINOPHEN 10 MG/ML IV SOLN
INTRAVENOUS | Status: AC
  Filled 2024-03-12: qty 100

## 2024-03-12 MED ORDER — SODIUM CHLORIDE 0.9 % IR SOLN: 3000.0000 mL | Status: DC

## 2024-03-12 MED ORDER — DEXAMETHASONE SOD PHOSPHATE PF 10 MG/ML IJ SOLN
INTRAMUSCULAR | Status: DC | PRN
  Administered 2024-03-12: 10 mg via INTRAVENOUS

## 2024-03-12 MED ORDER — ORAL CARE MOUTH RINSE: 15.0000 mL | Freq: Once | OROMUCOSAL | Status: AC

## 2024-03-12 MED ORDER — LIDOCAINE 5 % EX OINT
1.0000 | TOPICAL_OINTMENT | Freq: Three times a day (TID) | CUTANEOUS | Status: DC | PRN
  Administered 2024-03-13: 1 via TOPICAL
  Filled 2024-03-12 (×2): qty 35.44

## 2024-03-12 MED ORDER — LIDOCAINE HCL (CARDIAC) PF 100 MG/5ML IV SOSY
PREFILLED_SYRINGE | INTRAVENOUS | Status: DC | PRN
  Administered 2024-03-12: 100 mg via INTRAVENOUS

## 2024-03-12 MED ORDER — CEFAZOLIN SODIUM-DEXTROSE 2-4 GM/100ML-% IV SOLN
INTRAVENOUS | Status: AC
  Filled 2024-03-12: qty 100

## 2024-03-12 MED ORDER — OXYCODONE HCL 5 MG PO TABS
5.0000 mg | ORAL_TABLET | Freq: Once | ORAL | Status: AC
  Administered 2024-03-12: 5 mg via ORAL

## 2024-03-12 MED ORDER — CHLORHEXIDINE GLUCONATE CLOTH 2 % EX PADS
6.0000 | MEDICATED_PAD | Freq: Every day | CUTANEOUS | Status: DC
  Administered 2024-03-12: 6 via TOPICAL

## 2024-03-12 SURGICAL SUPPLY — 40 items
BAG DRAIN SIEMENS DORNER NS (MISCELLANEOUS) ×1 IMPLANT
BAG URINE DRAIN 2000ML AR STRL (UROLOGICAL SUPPLIES) ×1 IMPLANT
BALLOON OPTILUME DCB 24X5X75 (BALLOONS) IMPLANT
BALLOON OPTILUME DCB 30X3X75 (BALLOONS) IMPLANT
BALLOON OPTILUME DCB 30X5X75 (BALLOONS) IMPLANT
BRUSH SCRUB EZ 1% IODOPHOR (MISCELLANEOUS) ×1 IMPLANT
BRUSH SCRUB EZ 4% CHG (MISCELLANEOUS) IMPLANT
CATH FOL 2WAY LX 16X5 (CATHETERS) ×1 IMPLANT
CATH FOL 2WAY LX 18X30 (CATHETERS) ×1 IMPLANT
CATH FOLEY 2W COUNCIL 20FR 5CC (CATHETERS) IMPLANT
CATH SET URETHRAL DILATOR (CATHETERS) ×1 IMPLANT
CATH URET FLEX-TIP 2 LUMEN 10F (CATHETERS) ×1 IMPLANT
CATH URTH STD 24FR FL 3W 2 (CATHETERS) IMPLANT
DRAPE UTILITY 15X26 TOWEL STRL (DRAPES) ×1 IMPLANT
DRSG TELFA 3X4 N-ADH STERILE (GAUZE/BANDAGES/DRESSINGS) ×1 IMPLANT
ELECTRODE COAG BIPLR CYL 1.2MM (ELECTROSURGICAL) IMPLANT
ELECTRODE LOOP 22F BIPOLAR SML (ELECTROSURGICAL) IMPLANT
ELECTRODE REM PT RTRN 9FT ADLT (ELECTROSURGICAL) ×1 IMPLANT
GLOVE BIOGEL PI IND STRL 7.5 (GLOVE) ×1 IMPLANT
GOWN STRL REUS W/ TWL LRG LVL3 (GOWN DISPOSABLE) ×2 IMPLANT
GOWN STRL REUS W/ TWL XL LVL3 (GOWN DISPOSABLE) ×2 IMPLANT
GOWN STRL REUS W/TWL XL LVL4 (GOWN DISPOSABLE) ×1 IMPLANT
GUIDEWIRE STR DUAL SENSOR (WIRE) ×2 IMPLANT
HOLDER FOLEY CATH W/STRAP (MISCELLANEOUS) ×1 IMPLANT
KIT TURNOVER CYSTO (KITS) ×1 IMPLANT
LOOP CUT BIPOLAR 24F LRG (ELECTROSURGICAL) IMPLANT
NDL SAFETY ECLIP 18X1.5 (MISCELLANEOUS) ×1 IMPLANT
NEEDLE HYPO 18GX1.5 BLUNT FILL (NEEDLE) IMPLANT
PACK CYSTO AR (MISCELLANEOUS) ×1 IMPLANT
SET CYSTO IRRIGATION (SET/KITS/TRAYS/PACK) ×1 IMPLANT
SET IRRIG Y-TYPE CYSTO (SET/KITS/TRAYS/PACK) ×1 IMPLANT
SHEATH NAVIGATOR HD 12/14X36 (SHEATH) ×1 IMPLANT
SOL .9 NS 3000ML IRR UROMATIC (IV SOLUTION) ×2 IMPLANT
SOLN STERILE WATER 500 ML (IV SOLUTION) ×1 IMPLANT
SOLN STERILE WATER BTL 1000 ML (IV SOLUTION) ×1 IMPLANT
SURGILUBE 2OZ TUBE FLIPTOP (MISCELLANEOUS) ×1 IMPLANT
SYR 10ML LL (SYRINGE) ×1 IMPLANT
SYR 30ML LL (SYRINGE) ×1 IMPLANT
SYRINGE TOOMEY IRRIG 70ML (MISCELLANEOUS) ×1 IMPLANT
WATER STERILE IRR 3000ML UROMA (IV SOLUTION) ×1 IMPLANT

## 2024-03-12 NOTE — Interval H&P Note (Signed)
 History and Physical Interval Note:  03/12/2024 11:22 AM  Christopher Mills  has presented today for surgery, with the diagnosis of Hematuria, Pyuria, Urethral Obstruction.  The various methods of treatment have been discussed with the patient and family. After consideration of risks, benefits and other options for treatment, the patient has consented to  Procedures: CYSTOSCOPY (N/A) CYSTOSCOPY, WITH URETHRAL DILATION (N/A) CYSTOSCOPY, WITH BLADDER FULGURATION (N/A) CYSTOSCOPY, WITH BIOPSY (N/A) TURBT (TRANSURETHRAL RESECTION OF BLADDER TUMOR) (N/A) as a surgical intervention.  The patient's history has been reviewed, patient examined, no change in status, stable for surgery.  I have reviewed the patient's chart and labs.  Questions were answered to the patient's satisfaction.    Continues with intermittent gross hematuria.  CV: RRR Lungs: Clear   Arrin Pintor C Epifanio Labrador

## 2024-03-12 NOTE — Anesthesia Procedure Notes (Signed)
 Procedure Name: LMA Insertion Date/Time: 03/12/2024 11:40 AM  Performed by: Lem Peary R, CRNAPre-anesthesia Checklist: Patient identified, Emergency Drugs available, Suction available, Patient being monitored and Timeout performed Patient Re-evaluated:Patient Re-evaluated prior to induction Oxygen Delivery Method: Circle system utilized Preoxygenation: Pre-oxygenation with 100% oxygen Induction Type: IV induction LMA: LMA inserted LMA Size: 4.0 Number of attempts: 1 Placement Confirmation: positive ETCO2 and breath sounds checked- equal and bilateral Tube secured with: Tape Dental Injury: Teeth and Oropharynx as per pre-operative assessment

## 2024-03-12 NOTE — Transfer of Care (Signed)
 Immediate Anesthesia Transfer of Care Note  Patient: Christopher Mills  Procedure(s) Performed: CYSTOSCOPY CYSTOSCOPY, WITH BLADDER FULGURATION CYSTOSCOPY, WITH BIOPSY  Patient Location: PACU  Anesthesia Type:General  Level of Consciousness: sedated  Airway & Oxygen Therapy: Patient Spontanous Breathing  Post-op Assessment: Report given to RN and Post -op Vital signs reviewed and stable  Post vital signs: Reviewed and stable  Last Vitals:  Vitals Value Taken Time  BP 108/76 03/12/24 13:00  Temp    Pulse 79 03/12/24 13:01  Resp 18 03/12/24 13:01  SpO2 100 % 03/12/24 13:01  Vitals shown include unfiled device data.  Last Pain:  Vitals:   03/12/24 0952  TempSrc: Temporal  PainSc: 10-Worst pain ever         Complications: There were no known notable events for this encounter.

## 2024-03-12 NOTE — Anesthesia Postprocedure Evaluation (Signed)
"   Anesthesia Post Note  Patient: Christopher Mills  Procedure(s) Performed: CYSTOSCOPY CYSTOSCOPY, WITH BLADDER FULGURATION CYSTOSCOPY, WITH BIOPSY  Patient location during evaluation: PACU Anesthesia Type: General Level of consciousness: awake and alert Pain management: pain level controlled Vital Signs Assessment: post-procedure vital signs reviewed and stable Respiratory status: spontaneous breathing, nonlabored ventilation and respiratory function stable Cardiovascular status: blood pressure returned to baseline and stable Postop Assessment: no apparent nausea or vomiting Anesthetic complications: no   There were no known notable events for this encounter.   Last Vitals:  Vitals:   03/12/24 1445 03/12/24 1514  BP: 124/80 110/71  Pulse: (!) 47 (!) 50  Resp: (!) 9 12  Temp: (!) 36.1 C (!) 36.3 C  SpO2: 100% 100%    Last Pain:  Vitals:   03/12/24 1514  TempSrc: Oral  PainSc:                  [name]      "

## 2024-03-12 NOTE — Anesthesia Preprocedure Evaluation (Addendum)
"                                    Anesthesia Evaluation  Patient identified by MRN, date of birth, ID band Patient awake    Reviewed: Allergy & Precautions, H&P , NPO status , Patient's Chart, lab work & pertinent test results  Airway Mallampati: III  TM Distance: >3 FB Neck ROM: full    Dental  (+) Edentulous Upper, Missing, Poor Dentition, Chipped   Pulmonary former smoker   Pulmonary exam normal        Cardiovascular Normal cardiovascular exam     Neuro/Psych negative neurological ROS  negative psych ROS   GI/Hepatic negative GI ROS, Neg liver ROS,,,  Endo/Other  negative endocrine ROS    Renal/GU      Musculoskeletal   Abdominal Normal abdominal exam  (+)   Peds  Hematology  (+) Blood dyscrasia, anemia High grade B-cell lymphoma    Anesthesia Other Findings Recent admission for UTI, AKI, hypotension with discharge 12/19. H/o HTN but blood pressure was low during admission and medications have been held.   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) No date: Port-A-Cath in place No date: Pyuria No date: Salmonella gastroenteritis 02/29/2024: Sepsis secondary to UTI (HCC) No date: Thrombocytopenia No date: Urethral obstruction  Past Surgical History: No date: BONE MARROW BIOPSY No date: COLONOSCOPY 06/12/2020: EXCISION MASS NECK; Right     Comment:  Procedure: EXCISION MASS NECK;  Surgeon: [name],              MD;  Location: [facility] ORS;  Service: General;  Laterality:               Right; No date: HERNIA REPAIR; Right     Comment:  at age 17-RIH 06/24/2020: PORTACATH PLACEMENT; Right     Comment:  Procedure: INSERTION  PORT-A-CATH;  Surgeon: [name], MD;  Location: [facility] ORS;  Service: General;                Laterality: Right;     Reproductive/Obstetrics negative OB ROS                              Anesthesia Physical Anesthesia Plan  ASA: 2  Anesthesia Plan: General LMA   Post-op Pain Management: Minimal or no pain anticipated   Induction: Intravenous  PONV Risk Score and Plan: Dexamethasone , Ondansetron , Propofol  infusion and TIVA  Airway Management Planned: LMA  Additional Equipment:   Intra-op Plan:   Post-operative Plan: Extubation in OR  Informed Consent: I have reviewed the patients History and Physical, chart, labs and discussed the procedure including the risks, benefits and alternatives for the proposed anesthesia with the patient or authorized representative who has indicated his/her understanding and acceptance.     Dental Advisory Given  Plan Discussed with: Anesthesiologist, CRNA and Surgeon  Anesthesia Plan Comments:          Anesthesia Quick Evaluation  "

## 2024-03-12 NOTE — Progress Notes (Signed)
 Initial Nutrition Assessment  DOCUMENTATION CODES:   Not applicable  INTERVENTION:   Ensure Plus High Protein po TID, each supplement provides 350 kcal and 20 grams of protein  MVI po daily   Pt at refeed risk; recommend monitor potassium, magnesium and phosphorus labs daily until stable  Daily weights   NUTRITION DIAGNOSIS:   Increased nutrient needs related to cancer and cancer related treatments as evidenced by estimated needs.  GOAL:   Patient will meet greater than or equal to 90% of their needs  MONITOR:   PO intake, Supplement acceptance, Labs, Weight trends, I & O's, Skin  REASON FOR ASSESSMENT:   Malnutrition Screening Tool    ASSESSMENT:   63 y/o male with h/o B-cell lymphoma s/p chemotherapy, HTN and urinary stricture with indwelling foley who is admitted with hemorrhagic cystitis, UTI, sepsis and AKI.  RD working remotely.  Pt with increased estimated needs secondary to lymphoma. RD will add supplements and MVI to help pt meet his estimated needs (prefers chocolate). Per chart, pt appears weight stable pta. RD will obtain history and exam at follow up.   Medications reviewed and include: vitamin C, keflex , D3, LRS@ 56ml/hr  Labs reviewed:  NUTRITION - FOCUSED PHYSICAL EXAM: Unable to perform at this time   Diet Order:   Diet Order             Diet regular Room service appropriate? Yes; Fluid consistency: Thin  Diet effective now                  EDUCATION NEEDS:   No education needs have been identified at this time  Skin:  Skin Assessment: Reviewed RN Assessment (incision penis)  Last BM:  12/28  Height:   Ht Readings from Last 1 Encounters:  03/12/24 6' 1 (1.854 m)    Weight:   Wt Readings from Last 1 Encounters:  03/12/24 74.8 kg    Ideal Body Weight:  83.6 kg  BMI:  Body mass index is 21.77 kg/m.  Estimated Nutritional Needs:   Kcal:  2100-2400kcal/day  Protein:  105-120g/day  Fluid:  2.2-2.5L/day  Augustin Shams MS, RD, LDN If unable to be reached, please send secure chat to RD inpatient available from 8:00a-4:00p daily

## 2024-03-12 NOTE — Op Note (Signed)
" ° °  Preoperative diagnosis:  Gross hematuria  Postoperative diagnosis:  Hemorrhagic cystitis  Procedure: Cystoscopy with bladder biopsies/fulguration  Surgeon: [name], MD  Anesthesia: General  Complications: None  Intraoperative findings:  Cystoscopy: Dilated fossa navicularis stricture.  Urethra otherwise normal in appearance.  Moderate lateral lobe enlargement prostate.  No prostatic urethral bleeding noted.  Global bladder mucosal erythema with petechial hemorrhages.  No solid or papillary lesions.  EBL: Minimal  Specimens: Bladder biopsies  Indication: Christopher Mills is a 63 y.o. patient with persistent gross hematuria.  Refer to the admission H&P for details.  Upper tract imaging was negative.  After reviewing the management options for treatment, he elected to proceed with the above surgical procedure(s). We have discussed the potential benefits and risks of the procedure, side effects of the proposed treatment, the likelihood of the patient achieving the goals of the procedure, and any potential problems that might occur during the procedure or recuperation. Informed consent has been obtained.  Description of procedure:  The patient was taken to the operating room and general anesthesia was induced.  The patient was placed in the dorsal lithotomy position, prepped and draped in the usual sterile fashion, and preoperative antibiotics were administered. A preoperative time-out was performed.   A 21 French cystoscope with 30 degree lens was lubricated, placed per urethra and advanced into the bladder under direct vision with findings as described above.  Panendoscopy was then performed however visualization was suboptimal.  A few small clots were irrigated from the bladder.  The cystoscope was removed and a 85F continuous-flow resectoscope sheath with obturator was lubricated, placed per urethra and easily advanced into the bladder.  The obturator was replaced with an  Iglesias resectoscope with rollerball electrode.  Visualization was significant improved with the continuous-flow resectoscope with findings as described above.  Biopsies were performed with the rigid forceps x 4.  The resectoscope was replaced and biopsy sites were fulgurated.  The UOs were identified with clear efflux bilaterally.  Oozing sites were fulgurated with the rollerball electrode.  With the irrigant on low flow no significant oozing areas were noted.  The resectoscope was removed.  A 55F three-way Foley catheter was then placed without difficulty.  15 mL of sterile water  was placed in the balloon.  CBI was initiated at a moderate flow with return of clear effluent.  Plan: Placed in observation and will continue CBI overnight   [name], M.D.  "

## 2024-03-13 ENCOUNTER — Other Ambulatory Visit: Payer: Self-pay

## 2024-03-13 ENCOUNTER — Encounter: Payer: Self-pay | Admitting: Urology

## 2024-03-13 DIAGNOSIS — N3090 Cystitis, unspecified without hematuria: Secondary | ICD-10-CM | POA: Diagnosis not present

## 2024-03-13 DIAGNOSIS — N3091 Cystitis, unspecified with hematuria: Secondary | ICD-10-CM | POA: Diagnosis not present

## 2024-03-13 LAB — CBC
HCT: 27.4 % — ABNORMAL LOW (ref 39.0–52.0)
Hemoglobin: 8.9 g/dL — ABNORMAL LOW (ref 13.0–17.0)
MCH: 30.6 pg (ref 26.0–34.0)
MCHC: 32.5 g/dL (ref 30.0–36.0)
MCV: 94.2 fL (ref 80.0–100.0)
Platelets: 147 K/uL — ABNORMAL LOW (ref 150–400)
RBC: 2.91 MIL/uL — ABNORMAL LOW (ref 4.22–5.81)
RDW: 14.6 % (ref 11.5–15.5)
WBC: 2.7 K/uL — ABNORMAL LOW (ref 4.0–10.5)
nRBC: 0 % (ref 0.0–0.2)

## 2024-03-13 LAB — SURGICAL PATHOLOGY

## 2024-03-13 LAB — BASIC METABOLIC PANEL WITH GFR
Anion gap: 10 (ref 5–15)
BUN: 10 mg/dL (ref 8–23)
CO2: 25 mmol/L (ref 22–32)
Calcium: 9.7 mg/dL (ref 8.9–10.3)
Chloride: 105 mmol/L (ref 98–111)
Creatinine, Ser: 0.78 mg/dL (ref 0.61–1.24)
GFR, Estimated: >60 mL/min
Glucose, Bld: 156 mg/dL — ABNORMAL HIGH (ref 70–99)
Potassium: 4 mmol/L (ref 3.5–5.1)
Sodium: 139 mmol/L (ref 135–145)

## 2024-03-13 LAB — GLUCOSE, CAPILLARY: Glucose-Capillary: 136 mg/dL — ABNORMAL HIGH (ref 70–99)

## 2024-03-13 MED ORDER — SENNOSIDES-DOCUSATE SODIUM 8.6-50 MG PO TABS
1.0000 | ORAL_TABLET | Freq: Once | ORAL | Status: AC
  Administered 2024-03-13: 1 via ORAL
  Filled 2024-03-13: qty 1

## 2024-03-13 MED ORDER — OXYBUTYNIN CHLORIDE 5 MG PO TABS
5.0000 mg | ORAL_TABLET | Freq: Three times a day (TID) | ORAL | Status: DC | PRN
  Administered 2024-03-13: 5 mg via ORAL
  Filled 2024-03-13: qty 1

## 2024-03-13 MED ORDER — OXYBUTYNIN CHLORIDE 5 MG PO TABS
5.0000 mg | ORAL_TABLET | Freq: Three times a day (TID) | ORAL | 0 refills | Status: AC | PRN
  Filled 2024-03-13: qty 30, 10d supply, fill #0

## 2024-03-13 NOTE — Discharge Summary (Signed)
 Date of admission: 03/12/2024  Date of discharge: 03/13/2024  Admission diagnosis: Gross hematuria  Discharge diagnosis: Hemorrhagic cystitis, fossa navicularis stricture  Secondary diagnoses:  Patient Active Problem List   Diagnosis Date Noted   Hemorrhagic cystitis 03/12/2024   Gross hematuria 03/01/2024   Hypotension 03/01/2024   AKI (acute kidney injury) 02/29/2024   Sepsis secondary to UTI (HCC) 02/29/2024   Foley catheter in place 02/29/2024   Frequent urination 01/17/2024   Acute cystitis without hematuria 01/17/2024   Encounter to establish care 08/17/2021   Goals of care, counseling/discussion 08/10/2021   Palliative care encounter    Neutropenia 07/12/2020   Thrombocytopenia 07/12/2020   Hypertension    High grade B-cell lymphoma (HCC) 06/18/2020   History and Physical: For full details, please see admission history and physical. Briefly, Christopher Mills is a 63 y.o. year old patient admitted on 03/12/2024 for CBI after undergoing cystoscopy with bladder biopsies and fulguration with Dr. Twylla.  Intraoperatively, he was found to have a dilated fossa navicularis stricture and hemorrhagic cystitis, likely cyclophosphamide  induced.  Urine is clear today on slow drip CBI.  Blood counts down consistent with dilution effect. He reports occasional bladder spasms and penile discomfort due to his catheter but is otherwise comfortable.  Catheter was subsequently removed and he was able to void.  Physical Exam: Constitutional:  Alert and oriented, no acute distress, nontoxic appearing HEENT: [place], AT Cardiovascular: No clubbing, cyanosis, or edema Respiratory: Normal respiratory effort, no increased work of breathing Skin: No rashes, bruises or suspicious lesions Neurologic: Grossly intact, no focal deficits, moving all 4 extremities Psychiatric: Normal mood and affect   Hospital Course: Patient tolerated the procedure well.  He was then transferred to the floor after an  uneventful PACU stay.  His hospital course was uncomplicated.  On POD#1 he had met discharge criteria: was eating a regular diet, was up and ambulating independently,  pain was well controlled, was voiding without a catheter, and was ready for discharge.  Laboratory values:  Recent Labs    03/13/24 0540  WBC 2.7*  HGB 8.9*  HCT 27.4*   Recent Labs    03/13/24 0540  NA 139  K 4.0  CL 105  CO2 25  GLUCOSE 156*  BUN 10  CREATININE 0.78  CALCIUM 9.7   Results for orders placed or performed during the hospital encounter of 02/29/24  Culture, blood (routine x 2)     Status: None   Collection Time: 02/29/24 12:01 PM   Specimen: BLOOD  Result Value Ref Range Status   Specimen Description BLOOD BLOOD LEFT FOREARM  Final   Special Requests   Final    BOTTLES DRAWN AEROBIC AND ANAEROBIC Blood Culture results may not be optimal due to an inadequate volume of blood received in culture bottles   Culture   Final    NO GROWTH 5 DAYS Performed at [facility], [address], [place], [place] [address]    Report Status 03/05/2024 FINAL  Final  Urine Culture     Status: None   Collection Time: 02/29/24  2:55 PM   Specimen: Urine, Catheterized  Result Value Ref Range Status   Specimen Description   Final    URINE, CATHETERIZED Performed at [facility], [address] [place], [place] [address]    Special Requests   Final    NONE Performed at [facility], [address] [place], [place] [address]    Culture   Final  NO GROWTH Performed at [facility], [address], [place], [place] [address]    Report Status 03/01/2024 FINAL  Final  Culture, blood (routine x 2)     Status: None   Collection Time: 02/29/24  8:03 PM   Specimen: BLOOD  Result Value Ref Range Status   Specimen Description BLOOD BLOOD RIGHT FOREARM  Final   Special Requests   Final    BOTTLES DRAWN AEROBIC AND ANAEROBIC Blood Culture adequate volume   Culture   Final     NO GROWTH 5 DAYS Performed at [facility], [address], [place], [place] [address]    Report Status 03/05/2024 FINAL  Final   Disposition: Home  Discharge instruction: The patient was instructed to be ambulatory but told to refrain from heavy lifting, strenuous activity, or driving.   Discharge medications:  Allergies as of 03/13/2024   No Known Allergies      Medication List     TAKE these medications    ascorbic acid  500 MG tablet Commonly known as: VITAMIN C Take 500 mg by mouth daily.   atenolol  25 MG tablet Commonly known as: TENORMIN  Take 1 tablet (25 mg total) by mouth daily.   cephALEXin  500 MG capsule Commonly known as: KEFLEX  Take 1 capsule (500 mg total) by mouth 2 (two) times daily for 3 days.   cholecalciferol  25 MCG (1000 UNIT) tablet Commonly known as: VITAMIN D3 Take 1,000 Units by mouth daily.   FISH OIL ADULT GUMMIES PO Take 1 tablet by mouth daily at 6 (six) AM.   lidocaine  5 % ointment Commonly known as: XYLOCAINE  Apply 1 Application topically 3 (three) times daily as needed.   lisinopril  10 MG tablet Commonly known as: ZESTRIL  Take 1 tablet (10 mg total) by mouth daily.   midodrine  5 MG tablet Commonly known as: PROAMATINE  Take 1 tablet (5 mg total) by mouth 3 (three) times daily with meals.   oxybutynin  5 MG tablet Commonly known as: DITROPAN  Take 1 tablet (5 mg total) by mouth every 8 (eight) hours as needed for bladder spasms.   oxyCODONE -acetaminophen  5-325 MG tablet Commonly known as: PERCOCET/ROXICET Take 1 tablet by mouth every 4 (four) hours as needed for severe pain (pain score 7-10).        Followup:   Follow-up Information     Twylla Glendia BROCKS, MD Follow up on 03/28/2024.   Specialty: Urology Why: For postop follow up and pathology results Contact information: [address] [place] [address] [phone]

## 2024-03-13 NOTE — Plan of Care (Signed)
" °  Problem: Education: Goal: Knowledge of the prescribed therapeutic regimen will improve Outcome: Adequate for Discharge   Problem: Bowel/Gastric: Goal: Gastrointestinal status for postoperative course will improve Outcome: Adequate for Discharge   Problem: Cardiac: Goal: Ability to maintain an adequate cardiac output Outcome: Adequate for Discharge Goal: Will show no evidence of cardiac arrhythmias Outcome: Adequate for Discharge   Problem: Nutritional: Goal: Will attain and maintain optimal nutritional status Outcome: Adequate for Discharge   Problem: Neurological: Goal: Will regain or maintain usual level of consciousness Outcome: Adequate for Discharge   Problem: Clinical Measurements: Goal: Ability to maintain clinical measurements within normal limits Outcome: Adequate for Discharge Goal: Postoperative complications will be avoided or minimized Outcome: Adequate for Discharge   Problem: Respiratory: Goal: Will regain and/or maintain adequate ventilation Outcome: Adequate for Discharge Goal: Respiratory status will improve Outcome: Adequate for Discharge   Problem: Skin Integrity: Goal: Demonstrates signs of wound healing without infection Outcome: Adequate for Discharge   Problem: Urinary Elimination: Goal: Will remain free from infection Outcome: Adequate for Discharge Goal: Ability to achieve and maintain adequate urine output Outcome: Adequate for Discharge   Problem: Education: Goal: Knowledge of General Education information will improve Description: Including pain rating scale, medication(s)/side effects and non-pharmacologic comfort measures Outcome: Adequate for Discharge   Problem: Health Behavior/Discharge Planning: Goal: Ability to manage health-related needs will improve Outcome: Adequate for Discharge   Problem: Clinical Measurements: Goal: Ability to maintain clinical measurements within normal limits will improve Outcome: Adequate for  Discharge Goal: Will remain free from infection Outcome: Adequate for Discharge Goal: Diagnostic test results will improve Outcome: Adequate for Discharge Goal: Respiratory complications will improve Outcome: Adequate for Discharge Goal: Cardiovascular complication will be avoided Outcome: Adequate for Discharge   Problem: Nutrition: Goal: Adequate nutrition will be maintained Outcome: Adequate for Discharge   Problem: Activity: Goal: Risk for activity intolerance will decrease Outcome: Adequate for Discharge   Problem: Coping: Goal: Level of anxiety will decrease Outcome: Adequate for Discharge   Problem: Elimination: Goal: Will not experience complications related to bowel motility Outcome: Adequate for Discharge Goal: Will not experience complications related to urinary retention Outcome: Adequate for Discharge   Problem: Pain Managment: Goal: General experience of comfort will improve and/or be controlled Outcome: Adequate for Discharge   Problem: Safety: Goal: Ability to remain free from injury will improve Outcome: Adequate for Discharge   Problem: Skin Integrity: Goal: Risk for impaired skin integrity will decrease Outcome: Adequate for Discharge   "

## 2024-03-13 NOTE — Plan of Care (Signed)
 " Problem: Education: Goal: Knowledge of the prescribed therapeutic regimen will improve 03/13/2024 1732 by Leonce Norene BIRCH, RN Outcome: Adequate for Discharge 03/13/2024 1730 by Leonce Norene BIRCH, RN Outcome: Adequate for Discharge   Problem: Bowel/Gastric: Goal: Gastrointestinal status for postoperative course will improve 03/13/2024 1732 by Leonce Norene BIRCH, RN Outcome: Adequate for Discharge 03/13/2024 1730 by Leonce Norene BIRCH, RN Outcome: Adequate for Discharge   Problem: Cardiac: Goal: Ability to maintain an adequate cardiac output 03/13/2024 1732 by Leonce Norene BIRCH, RN Outcome: Adequate for Discharge 03/13/2024 1730 by Leonce Norene BIRCH, RN Outcome: Adequate for Discharge Goal: Will show no evidence of cardiac arrhythmias 03/13/2024 1732 by Leonce Norene BIRCH, RN Outcome: Adequate for Discharge 03/13/2024 1730 by Leonce Norene BIRCH, RN Outcome: Adequate for Discharge   Problem: Nutritional: Goal: Will attain and maintain optimal nutritional status 03/13/2024 1732 by Leonce Norene BIRCH, RN Outcome: Adequate for Discharge 03/13/2024 1730 by Leonce Norene BIRCH, RN Outcome: Adequate for Discharge   Problem: Neurological: Goal: Will regain or maintain usual level of consciousness 03/13/2024 1732 by Leonce Norene BIRCH, RN Outcome: Adequate for Discharge 03/13/2024 1730 by Leonce Norene BIRCH, RN Outcome: Adequate for Discharge   Problem: Clinical Measurements: Goal: Ability to maintain clinical measurements within normal limits 03/13/2024 1732 by Leonce Norene BIRCH, RN Outcome: Adequate for Discharge 03/13/2024 1730 by Leonce Norene BIRCH, RN Outcome: Adequate for Discharge Goal: Postoperative complications will be avoided or minimized 03/13/2024 1732 by Leonce Norene BIRCH, RN Outcome: Adequate for Discharge 03/13/2024 1730 by Leonce Norene BIRCH, RN Outcome: Adequate for Discharge   Problem: Respiratory: Goal: Will regain and/or maintain adequate ventilation 03/13/2024 1732 by  Leonce Norene BIRCH, RN Outcome: Adequate for Discharge 03/13/2024 1730 by Leonce Norene BIRCH, RN Outcome: Adequate for Discharge Goal: Respiratory status will improve 03/13/2024 1732 by Leonce Norene BIRCH, RN Outcome: Adequate for Discharge 03/13/2024 1730 by Leonce Norene BIRCH, RN Outcome: Adequate for Discharge   Problem: Skin Integrity: Goal: Demonstrates signs of wound healing without infection 03/13/2024 1732 by Leonce Norene BIRCH, RN Outcome: Adequate for Discharge 03/13/2024 1730 by Leonce Norene BIRCH, RN Outcome: Adequate for Discharge   Problem: Urinary Elimination: Goal: Will remain free from infection 03/13/2024 1732 by Leonce Norene BIRCH, RN Outcome: Adequate for Discharge 03/13/2024 1730 by Leonce Norene BIRCH, RN Outcome: Adequate for Discharge Goal: Ability to achieve and maintain adequate urine output 03/13/2024 1732 by Leonce Norene BIRCH, RN Outcome: Adequate for Discharge 03/13/2024 1730 by Leonce Norene BIRCH, RN Outcome: Adequate for Discharge   Problem: Education: Goal: Knowledge of General Education information will improve Description: Including pain rating scale, medication(s)/side effects and non-pharmacologic comfort measures 03/13/2024 1732 by Leonce Norene BIRCH, RN Outcome: Adequate for Discharge 03/13/2024 1730 by Leonce Norene BIRCH, RN Outcome: Adequate for Discharge   Problem: Health Behavior/Discharge Planning: Goal: Ability to manage health-related needs will improve 03/13/2024 1732 by Leonce Norene BIRCH, RN Outcome: Adequate for Discharge 03/13/2024 1730 by Leonce Norene BIRCH, RN Outcome: Adequate for Discharge   Problem: Clinical Measurements: Goal: Ability to maintain clinical measurements within normal limits will improve 03/13/2024 1732 by Leonce Norene BIRCH, RN Outcome: Adequate for Discharge 03/13/2024 1730 by Leonce Norene BIRCH, RN Outcome: Adequate for Discharge Goal: Will remain free from infection 03/13/2024 1732 by Leonce Norene BIRCH, RN Outcome: Adequate  for Discharge 03/13/2024 1730 by Leonce Norene BIRCH, RN Outcome: Adequate for Discharge Goal: Diagnostic test results will improve 03/13/2024 1732 by Leonce Norene BIRCH, RN Outcome: Adequate for Discharge 03/13/2024 1730 by Leonce Norene BIRCH, RN Outcome:  Adequate for Discharge Goal: Respiratory complications will improve 03/13/2024 1732 by Leonce Norene BIRCH, RN Outcome: Adequate for Discharge 03/13/2024 1730 by Leonce Norene BIRCH, RN Outcome: Adequate for Discharge Goal: Cardiovascular complication will be avoided 03/13/2024 1732 by Leonce Norene BIRCH, RN Outcome: Adequate for Discharge 03/13/2024 1730 by Leonce Norene BIRCH, RN Outcome: Adequate for Discharge   Problem: Activity: Goal: Risk for activity intolerance will decrease 03/13/2024 1732 by Leonce Norene BIRCH, RN Outcome: Adequate for Discharge 03/13/2024 1730 by Leonce Norene BIRCH, RN Outcome: Adequate for Discharge   Problem: Nutrition: Goal: Adequate nutrition will be maintained 03/13/2024 1732 by Leonce Norene BIRCH, RN Outcome: Adequate for Discharge 03/13/2024 1730 by Leonce Norene BIRCH, RN Outcome: Adequate for Discharge   Problem: Coping: Goal: Level of anxiety will decrease 03/13/2024 1732 by Leonce Norene BIRCH, RN Outcome: Adequate for Discharge 03/13/2024 1730 by Leonce Norene BIRCH, RN Outcome: Adequate for Discharge   Problem: Elimination: Goal: Will not experience complications related to bowel motility 03/13/2024 1732 by Leonce Norene BIRCH, RN Outcome: Adequate for Discharge 03/13/2024 1730 by Leonce Norene BIRCH, RN Outcome: Adequate for Discharge Goal: Will not experience complications related to urinary retention 03/13/2024 1732 by Leonce Norene BIRCH, RN Outcome: Adequate for Discharge 03/13/2024 1730 by Leonce Norene BIRCH, RN Outcome: Adequate for Discharge   Problem: Pain Managment: Goal: General experience of comfort will improve and/or be controlled 03/13/2024 1732 by Leonce Norene BIRCH, RN Outcome: Adequate for  Discharge 03/13/2024 1730 by Leonce Norene BIRCH, RN Outcome: Adequate for Discharge   Problem: Safety: Goal: Ability to remain free from injury will improve 03/13/2024 1732 by Leonce Norene BIRCH, RN Outcome: Adequate for Discharge 03/13/2024 1730 by Leonce Norene BIRCH, RN Outcome: Adequate for Discharge   Problem: Skin Integrity: Goal: Risk for impaired skin integrity will decrease 03/13/2024 1732 by Leonce Norene BIRCH, RN Outcome: Adequate for Discharge 03/13/2024 1730 by Leonce Norene BIRCH, RN Outcome: Adequate for Discharge   Problem: Increased Nutrient Needs (NI-5.1) Goal: Food and/or nutrient delivery Description: Individualized approach for food/nutrient provision. Outcome: Adequate for Discharge   "

## 2024-03-14 ENCOUNTER — Inpatient Hospital Stay

## 2024-03-22 ENCOUNTER — Telehealth: Payer: Self-pay

## 2024-03-22 NOTE — Telephone Encounter (Signed)
 Voicemail received today 03/22/24 at 8:52am from patient requesting a call back to [phone]; no additional information provided.  Outbound call; patient scheduled for treatment on Mon 1/12.  Patient states he was recently hospitalized (recently discharged from ED on 03/12/24 for gross hematuria,  pyuria, and urethral obstruction), cystoscopy done after and is scheduled for a colonoscopy on 03/30/24.  Patient would like to touch base with Dr. Melanee to see if it is appropriate for him to even have treatment on Monday. Informed Dr. Melanee is not in clinic today but will review with her tomorrow and loop back with him. Patient verbalized understanding.

## 2024-03-23 ENCOUNTER — Encounter: Payer: Self-pay | Admitting: Oncology

## 2024-03-23 NOTE — Telephone Encounter (Signed)
 Outbound call to patient, informed of below.  Patient verbalized understanding and will see us  on Monday.

## 2024-03-23 NOTE — Telephone Encounter (Signed)
 I will see him on Monday as planned but please cancel his treatments.  Mercedes please let patient know that I would like to see him since he has had multiple things going on with him and we will decide based on the visit if and when we can restart treatment

## 2024-03-26 ENCOUNTER — Inpatient Hospital Stay

## 2024-03-26 ENCOUNTER — Encounter: Payer: Self-pay | Admitting: Oncology

## 2024-03-26 ENCOUNTER — Inpatient Hospital Stay: Attending: Oncology

## 2024-03-26 ENCOUNTER — Inpatient Hospital Stay: Attending: Oncology | Admitting: Oncology

## 2024-03-26 VITALS — BP 94/67 | HR 81 | Temp 96.0°F | Resp 18 | Ht 73.0 in | Wt 150.3 lb

## 2024-03-26 DIAGNOSIS — D709 Neutropenia, unspecified: Secondary | ICD-10-CM | POA: Diagnosis not present

## 2024-03-26 DIAGNOSIS — C8510 Unspecified B-cell lymphoma, unspecified site: Secondary | ICD-10-CM

## 2024-03-26 DIAGNOSIS — R339 Retention of urine, unspecified: Secondary | ICD-10-CM | POA: Diagnosis not present

## 2024-03-26 DIAGNOSIS — Z8249 Family history of ischemic heart disease and other diseases of the circulatory system: Secondary | ICD-10-CM | POA: Insufficient documentation

## 2024-03-26 DIAGNOSIS — R531 Weakness: Secondary | ICD-10-CM | POA: Diagnosis not present

## 2024-03-26 DIAGNOSIS — Z87440 Personal history of urinary (tract) infections: Secondary | ICD-10-CM | POA: Insufficient documentation

## 2024-03-26 DIAGNOSIS — R351 Nocturia: Secondary | ICD-10-CM | POA: Insufficient documentation

## 2024-03-26 DIAGNOSIS — Z79631 Long term (current) use of antimetabolite agent: Secondary | ICD-10-CM | POA: Diagnosis not present

## 2024-03-26 DIAGNOSIS — K5900 Constipation, unspecified: Secondary | ICD-10-CM | POA: Insufficient documentation

## 2024-03-26 DIAGNOSIS — Z79899 Other long term (current) drug therapy: Secondary | ICD-10-CM | POA: Insufficient documentation

## 2024-03-26 DIAGNOSIS — C8338 Diffuse large B-cell lymphoma, lymph nodes of multiple sites: Secondary | ICD-10-CM | POA: Diagnosis present

## 2024-03-26 DIAGNOSIS — R432 Parageusia: Secondary | ICD-10-CM | POA: Insufficient documentation

## 2024-03-26 DIAGNOSIS — Z820 Family history of epilepsy and other diseases of the nervous system: Secondary | ICD-10-CM | POA: Diagnosis not present

## 2024-03-26 DIAGNOSIS — N3091 Cystitis, unspecified with hematuria: Secondary | ICD-10-CM | POA: Insufficient documentation

## 2024-03-26 DIAGNOSIS — Z809 Family history of malignant neoplasm, unspecified: Secondary | ICD-10-CM | POA: Insufficient documentation

## 2024-03-26 DIAGNOSIS — R3915 Urgency of urination: Secondary | ICD-10-CM | POA: Insufficient documentation

## 2024-03-26 DIAGNOSIS — R590 Localized enlarged lymph nodes: Secondary | ICD-10-CM | POA: Insufficient documentation

## 2024-03-26 DIAGNOSIS — Z87891 Personal history of nicotine dependence: Secondary | ICD-10-CM | POA: Diagnosis not present

## 2024-03-26 DIAGNOSIS — R634 Abnormal weight loss: Secondary | ICD-10-CM | POA: Diagnosis not present

## 2024-03-26 DIAGNOSIS — I10 Essential (primary) hypertension: Secondary | ICD-10-CM | POA: Diagnosis not present

## 2024-03-26 LAB — CMP (CANCER CENTER ONLY)
ALT: 20 U/L (ref 0–44)
AST: 35 U/L (ref 15–41)
Albumin: 3.7 g/dL (ref 3.5–5.0)
Alkaline Phosphatase: 127 U/L — ABNORMAL HIGH (ref 38–126)
Anion gap: 10 (ref 5–15)
BUN: 17 mg/dL (ref 8–23)
CO2: 25 mmol/L (ref 22–32)
Calcium: 10.4 mg/dL — ABNORMAL HIGH (ref 8.9–10.3)
Chloride: 100 mmol/L (ref 98–111)
Creatinine: 1.3 mg/dL — ABNORMAL HIGH (ref 0.61–1.24)
GFR, Estimated: >60 mL/min
Glucose, Bld: 101 mg/dL — ABNORMAL HIGH (ref 70–99)
Potassium: 4.3 mmol/L (ref 3.5–5.1)
Sodium: 135 mmol/L (ref 135–145)
Total Bilirubin: 0.4 mg/dL (ref 0.0–1.2)
Total Protein: 6.6 g/dL (ref 6.5–8.1)

## 2024-03-26 LAB — CBC WITH DIFFERENTIAL (CANCER CENTER ONLY)
Abs Immature Granulocytes: 0.07 K/uL (ref 0.00–0.07)
Basophils Absolute: 0.1 K/uL (ref 0.0–0.1)
Basophils Relative: 1 %
Eosinophils Absolute: 0.1 K/uL (ref 0.0–0.5)
Eosinophils Relative: 1 %
HCT: 30.1 % — ABNORMAL LOW (ref 39.0–52.0)
Hemoglobin: 9.6 g/dL — ABNORMAL LOW (ref 13.0–17.0)
Immature Granulocytes: 2 %
Lymphocytes Relative: 14 %
Lymphs Abs: 0.7 K/uL (ref 0.7–4.0)
MCH: 29.4 pg (ref 26.0–34.0)
MCHC: 31.9 g/dL (ref 30.0–36.0)
MCV: 92 fL (ref 80.0–100.0)
Monocytes Absolute: 0.4 K/uL (ref 0.1–1.0)
Monocytes Relative: 9 %
Neutro Abs: 3.3 K/uL (ref 1.7–7.7)
Neutrophils Relative %: 73 %
Platelet Count: 221 K/uL (ref 150–400)
RBC: 3.27 MIL/uL — ABNORMAL LOW (ref 4.22–5.81)
RDW: 15.3 % (ref 11.5–15.5)
Smear Review: NORMAL
WBC Count: 4.5 K/uL (ref 4.0–10.5)
nRBC: 0 % (ref 0.0–0.2)

## 2024-03-26 MED ORDER — SODIUM CHLORIDE 0.9 % IV SOLN
Freq: Once | INTRAVENOUS | Status: AC
  Filled 2024-03-26: qty 250

## 2024-03-26 NOTE — Patient Instructions (Signed)
 Dehydration, Adult Dehydration is a condition in which there is not enough water or other fluids in the body. This happens when a person loses more fluids than they take in. Important organs cannot work right without the right amount of fluids. Any loss of fluids from the body can cause dehydration. Dehydration can be mild, worse, or very bad. It should be treated right away to keep it from getting very bad. What are the causes? Conditions that cause loss of water in the body. They include: Watery poop (diarrhea). Vomiting. Sweating a lot. Fever. Infection. Peeing (urinating) a lot. Not drinking enough fluids. Certain medicines, such as medicines that take extra fluid out of the body (diuretics). Lack of safe drinking water. Not being able to get enough water and food. What increases the risk? Having a long-term (chronic) illness that has not been treated the right way, such as: Diabetes. Heart disease. Kidney disease. Being 25 years of age or older. Having a disability. Living in a place that is high above the ground or sea (high in altitude). The thinner, drier air causes more fluid loss. Doing exercises that put stress on your body for a long time. Being active when in hot places. What are the signs or symptoms? Symptoms of dehydration depend on how bad it is. Mild or worse dehydration Thirst. Dry lips or dry mouth. Feeling dizzy or light-headed. Muscle cramps. Passing little pee or dark pee. Pee may be the color of tea. Headache. Very bad dehydration Changes in skin. Skin may: Be cold to the touch (clammy). Be blotchy or pale. Not go back to normal right after you pinch it and let it go. Little or no tears, pee, or sweat. Fast breathing. Low blood pressure. Weak pulse. Pulse that is more than 100 beats a minute when you are sitting still. Other changes, such as: Feeling very thirsty. Eyes that look hollow (sunken). Cold hands and feet. Being confused. Being very  tired (lethargic) or having trouble waking from sleep. Losing weight. Loss of consciousness. How is this treated? Treatment for this condition depends on how bad your dehydration is. Treatment should start right away. Do not wait until your condition gets very bad. Very bad dehydration is an emergency. You will need to go to a hospital. Mild or worse dehydration can be treated at home. You may be asked to: Drink more fluids. Drink an oral rehydration solution (ORS). This drink gives you the right amount of fluids, salts, and minerals (electrolytes). Very bad dehydration can be treated: With fluids through an IV tube. By correcting low levels of electrolytes in the body. By treating the problem that caused your dehydration. Follow these instructions at home: Oral rehydration solution If told by your doctor, drink an ORS: Make an ORS. Use instructions on the package. Start by drinking small amounts, about  cup (120 mL) every 5-10 minutes. Slowly drink more until you have had the amount that your doctor said to have.  Eating and drinking  Drink enough clear fluid to keep your pee pale yellow. If you were told to drink an ORS, finish the ORS first. Then, start slowly drinking other clear fluids. Drink fluids such as: Water. Do not drink only water. Doing that can make the salt (sodium) level in your body get too low. Water from ice chips you suck on. Fruit juice that you have added water to (diluted). Low-calorie sports drinks. Eat foods that have the right amounts of salts and minerals, such as bananas, oranges, potatoes,  tomatoes, or spinach. Do not drink alcohol. Avoid drinks that have caffeine or sugar. These include:: High-calorie sports drinks. Fruit juice that you did not add water to. Soda. Coffee or energy drinks. Avoid foods that are greasy or have a lot of fat or sugar. General instructions Take over-the-counter and prescription medicines only as told by your doctor. Do  not take sodium tablets. Doing that can make the salt level in your body get too high. Return to your normal activities as told by your doctor. Ask your doctor what activities are safe for you. Keep all follow-up visits. Your doctor may check and change your treatment. Contact a doctor if: You have pain in your belly (abdomen) and the pain: Gets worse. Stays in one place. You have a rash. You have a stiff neck. You get angry or annoyed more easily than normal. You are more tired or have a harder time waking than normal. You feel weak or dizzy. You feel very thirsty. Get help right away if: You have any symptoms of very bad dehydration. You vomit every time you eat or drink. Your vomiting gets worse, does not go away, or you vomit blood or green stuff. You are getting treatment, but symptoms are getting worse. You have a fever. You have a very bad headache. You have: Diarrhea that gets worse or does not go away. Blood in your poop (stool). This may cause poop to look black and tarry. No pee in 6-8 hours. Only a small amount of pee in 6-8 hours, and the pee is very dark. You have trouble breathing. These symptoms may be an emergency. Get help right away. Call 911. Do not wait to see if the symptoms will go away. Do not drive yourself to the hospital. This information is not intended to replace advice given to you by your health care provider. Make sure you discuss any questions you have with your health care provider. Document Revised: 09/28/2021 Document Reviewed: 09/28/2021 Elsevier Patient Education  2024 [employer].

## 2024-03-26 NOTE — Progress Notes (Signed)
 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  This was followed by a PET CT scan which showed extensive FDG avid adenopathy in the neck chest abdomen and pelvis.  FDG avid splenic lesions.  Solitary intramuscular FDG avid lesion in the right biceps femoris muscle.   Supraclavicular excisional lymph node biopsy showed high-grade B-cell lymphoma germinal center type Ki-67 greater than 95%.  FISH testing was positive for Bcl-2 BCL6 and MYC consistent with triple hit lymphoma.  By NCCN IPI score would be 4 based on age and elevated LDH and stage IV (intramuscular biceps femoris lesion) which puts him in the high intermediate risk group. 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   Patient received RCHOP for cycle 1 with plans for [facility] with cycle 2. IT MTX for CNS prophylaxis and he has received 3 cycles but declined further cycles.  MRI brain negative for lymphoma.  Chemotherapy was not being able to escalated upwards due to neutropenia which has persisted to 3 weeks and he has been receiving treatment every 4 weeks   PET CT scan after 3 cycles of chemotherapy  showedComplete or near complete metabolic response.  Residual disease in the anterior upper right mediastinum slightly greater than background mediastinal activity   Patient found to have a clinically palpable right supraclavicular lymph node which was subsequently biopsied and was consistent with previously diagnosed lymphoma.  PET CT scan showedNew right level 5 adenopathy in the lower neck with dominant lymph node 1.6 cm Deauville 5.  No other findings of acute active lymphoma in chest abdomen pelvis and bones.  MRI brain negative for leptomeningeal disease.   Patient seen for second opinion by Dr. Gaylan at [facility] for consideration of CAR-T cell therapy.  Patient did not wish to proceed with CAR-T cell treatment after reviewing risks versus benefits.  Moreover patient's health insurance will also not cover CAR-T cell therapy. Patient started on second line tafasitamab /Revlimid  regimen in June 2023.  He is on Revlimid  15 mg 3 weeks on and 1 week off.  Disease progression after 3 cycles and patient was switched to [facility] regimen     Patient was noted to haveSignificant decrease in the size of supraclavicular lymph nodes consistent with treatment response Deauville 3 after 3 cycles of Pola BR chemotherapy.  Patient has only received 4 cycles of chemotherapy so far due to persistent neutropenia despite receiving growth factor support.  Repeat PET scan showed new/recurrent hypermetabolic subcutaneous tissue in the high right back/inferior neck consistent with recurrent lymphoma Deauville 5.  He received palliative radiation to that area.  Patient was started on Zynlonta  in June 2024 after he was found to have recurrent disease involving subcutaneous right scapular region.  Patient desired  treatment break and therefore treatment was held again in November 2024 when PET scan showed complete resolution of disease.  Treatment was restarted in March 2025 when he had recurrent disease noted on PET scan    Interval  history- Christopher Mills is a 64 year old male with relapsed/refractory diffuse large B-cell lymphoma presenting for follow-up after recent hospitalization for hemorrhagic cystitis with ongoing urinary symptoms.  He was recently hospitalized for hemorrhagic cystitis, confirmed by cystoscopy which showed inflammation and erythema of the bladder wall. He continues to experience significant urinary symptoms, including urgency, frequency, nocturia, incomplete bladder emptying, persistent sensation of needing to urinate, and difficulty voiding with only small amounts of urine output. Hematuria persists but is improving. These symptoms have resulted in severe sleep disruption, with only approximately thirty minutes of sleep at a time overnight. He has not returned to baseline bladder function since hospitalization and is scheduled to follow up with urology on March 28, 2024.  He remains on his fourth line of therapy for DLBCL, with the last dose of Zynlonta  administered on February 13, 2024. The most recent PET scan in October 2025 demonstrated two active sites of disease, both improved compared to prior imaging but with persistent activity. He has not had recent ocular symptoms, though he previously experienced dry eyes while on Zynlonta . He experienced significant weight loss during his hospitalization and currently reports feeling markedly lighter. He is scheduled for a colonoscopy later this week and expressed concern about repeated anesthesia exposure in a short time frame.  Recent laboratory studies during and after hospitalization showed low hemoglobin, which has since improved. White blood cell count and platelets are stable. Renal function worsened during hospitalization, improved, and is now again slightly elevated. He has hypercalcemia on today's labs.       ECOG PS- 1 Pain scale- 0   Review of systems- Review of Systems  Constitutional:  Positive for malaise/fatigue and weight loss.  Negative for chills and fever.  HENT:  Negative for congestion, ear discharge and nosebleeds.   Eyes:  Negative for blurred vision.  Respiratory:  Negative for cough, hemoptysis, sputum production, shortness of breath and wheezing.   Cardiovascular:  Negative for chest pain, palpitations, orthopnea and claudication.  Gastrointestinal:  Negative for abdominal pain, blood in stool, constipation, diarrhea, heartburn, melena, nausea and vomiting.  Genitourinary:  Positive for frequency. Negative for dysuria, flank pain, hematuria and urgency.  Musculoskeletal:  Negative for back pain, joint pain and myalgias.  Skin:  Negative for rash.  Neurological:  Negative for dizziness, tingling, focal weakness, seizures, weakness and headaches.  Endo/Heme/Allergies:  Does not bruise/bleed easily.  Psychiatric/Behavioral:  Negative for depression and suicidal ideas. The patient does not have insomnia.       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   Port-A-Cath in place    Pyuria    Salmonella gastroenteritis    Sepsis secondary to UTI (HCC) 02/29/2024   Thrombocytopenia    Urethral obstruction      Past Surgical History:  Procedure Laterality Date   BONE MARROW BIOPSY     COLONOSCOPY     CYSTOSCOPY  N/A 03/12/2024   Procedure: CYSTOSCOPY;  Surgeon: Twylla Glendia BROCKS, MD;  Location: [facility] ORS;  Service: Urology;  Laterality: N/A;   CYSTOSCOPY WITH BIOPSY N/A 03/12/2024   Procedure: CYSTOSCOPY, WITH BIOPSY;  Surgeon: Twylla Glendia BROCKS, MD;  Location: [facility] ORS;  Service: Urology;  Laterality: N/A;   CYSTOSCOPY WITH FULGERATION N/A 03/12/2024   Procedure: CYSTOSCOPY, WITH BLADDER FULGURATION;   Surgeon: Twylla Glendia BROCKS, MD;  Location: [facility] ORS;  Service: Urology;  Laterality: N/A;   EXCISION MASS NECK Right 06/12/2020   Procedure: EXCISION MASS NECK;  Surgeon: [name], MD;  Location: [facility] ORS;  Service: General;  Laterality: Right;   HERNIA REPAIR Right    at age 49-RIH   PORTACATH PLACEMENT Right 06/24/2020   Procedure: INSERTION PORT-A-CATH;  Surgeon: [name], MD;  Location: [facility] ORS;  Service: General;  Laterality: Right;    Social History   Socioeconomic History   Marital status: Single    Spouse name: Not on file   Number of children: Not on file   Years of education: Not on file   Highest education level: Not on file  Occupational History   Not on file  Tobacco Use   Smoking status: Former    Current packs/day: 0.00    Types: Cigarettes    Quit date: 06/13/2020    Years since quitting: 3.7   Smokeless tobacco: Never  Vaping Use   Vaping status: Never Used  Substance and Sexual Activity   Alcohol use: Not Currently   Drug use: Not Currently    Types: Marijuana   Sexual activity: Not Currently  Other Topics Concern   Not on file  Social History Narrative   Has daughter and grandson in the home   Social Drivers of Health   Tobacco Use: Medium Risk (03/26/2024)   Patient History    Smoking Tobacco Use: Former    Smokeless Tobacco Use: Never    Passive Exposure: Not on file  Financial Resource Strain: Medium Risk (07/30/2021)   Received from [facility]   Overall Financial Resource Strain (CARDIA)    Difficulty of Paying Living Expenses: Somewhat hard  Food Insecurity: No Food Insecurity (03/12/2024)   Epic    Worried About Radiation Protection Practitioner of Food in the Last Year: Never true    Ran Out of Food in the Last Year: Never true  Transportation Needs: No Transportation Needs (03/12/2024)   Epic    Lack of Transportation (Medical): No    Lack of Transportation (Non-Medical): No  Physical Activity: Not on file  Stress: No Stress Concern Present  (07/30/2021)   Received from [facility] - Occupational Stress Questionnaire    Feeling of Stress : Not at all  Social Connections: Not on file  Intimate Partner Violence: Not At Risk (03/12/2024)   Epic    Fear of Current or Ex-Partner: No    Emotionally Abused: No    Physically Abused: No    Sexually Abused: No  Depression (PHQ2-9): Low Risk (03/26/2024)   Depression (PHQ2-9)    PHQ-2 Score: 0  Alcohol Screen: Not on file  Housing: Low Risk (03/12/2024)   Epic    Unable to Pay for Housing in the Last Year: No    Number of Times Moved in the Last Year: 0    Homeless in the Last Year: No  Utilities: Not At Risk (03/12/2024)   Epic    Threatened with loss of utilities:  No  Health Literacy: Low Risk (07/30/2021)   Received from [facility]    How often do you need to have someone help you when you read instructions, pamphlets, or other written material from your doctor or pharmacy?: Never    Family History  Problem Relation Age of Onset   Anemia Mother    Hypertension Mother    Goiter Mother    Cancer Father    Cancer Sister    Multiple sclerosis Sister     Current Medications[2]  Physical exam:  Vitals:   03/26/24 0926  BP: 94/67  Pulse: 81  Resp: 18  Temp: (!) 96 F (35.6 C)  TempSrc: Tympanic  SpO2: 100%  Weight: 150 lb 4.8 oz (68.2 kg)  Height: 6' 1 (1.854 m)   Physical Exam Cardiovascular:     Rate and Rhythm: Normal rate and regular rhythm.     Heart sounds: Normal heart sounds.  Pulmonary:     Effort: Pulmonary effort is normal.     Breath sounds: Normal breath sounds.  Skin:    General: Skin is warm and dry.  Neurological:     Mental Status: He is alert and oriented to person, place, and time.      I have personally reviewed labs listed below:    Latest Ref Rng & Units 03/26/2024    9:10 AM  CMP  Glucose 70 - 99 mg/dL 898   BUN 8 - 23 mg/dL 17   Creatinine 9.38 - 1.24  mg/dL 8.69   Sodium 864 - 854 mmol/L 135   Potassium 3.5 - 5.1 mmol/L 4.3   Chloride 98 - 111 mmol/L 100   CO2 22 - 32 mmol/L 25   Calcium 8.9 - 10.3 mg/dL 89.5   Total Protein 6.5 - 8.1 g/dL 6.6   Total Bilirubin 0.0 - 1.2 mg/dL 0.4   Alkaline Phos 38 - 126 U/L 127   AST 15 - 41 U/L 35   ALT 0 - 44 U/L 20       Latest Ref Rng & Units 03/26/2024    9:11 AM  CBC  WBC 4.0 - 10.5 K/uL 4.5   Hemoglobin 13.0 - 17.0 g/dL 9.6   Hematocrit 60.9 - 52.0 % 30.1   Platelets 150 - 400 K/uL 221    I have personally reviewed Radiology images listed below: No images are attached to the encounter.  DG Chest Portable 1 View Result Date: 02/29/2024 CLINICAL DATA:  Weakness and hypotension. EXAM: PORTABLE CHEST 1 VIEW COMPARISON:  07/12/2020 FINDINGS: Right IJ central venous catheter looped over the right neck as seen previously with tip over the SVC. Surgical clips over the right neck base. Lungs are adequately inflated and otherwise clear. Cardiomediastinal silhouette and remainder of the exam is unchanged. IMPRESSION: 1. No acute cardiopulmonary disease. 2. Right IJ central venous catheter looped over the right neck without significant change. Electronically Signed   By: Toribio Agreste M.D.   On: 02/29/2024 13:49     Assessment and plan- Patient is a 64 y.o. male with history of relapsed triple had diffuse large B-cell lymphoma on zynlonta  here for routine f/u  Assessment and Plan    Relapsed/refractory non-Hodgkin lymphoma Persistent disease activity on PET scan despite four lines of therapy. Two active sites noted, improved but unresolved. Significant weight loss since hospitalization. No new systemic therapy planned pending further assessment. - Ordered PET scan to assess current lymphoma activity. - Planned follow-up visit after PET  scan to discuss further management.  Hemorrhagic cystitis secondary to cyclophosphamide  chemotherapy Developed hemorrhagic cystitis likely secondary to prior  cyclophosphamide  therapy. Symptoms include urinary urgency, frequency, nocturia, incomplete bladder emptying, and intermittent hematuria, impacting sleep and quality of life. Zynlonta  not associated with this complication. - Referred to urology for ongoing management. - Planned urology follow-up on January 14.  Acute kidney injury Acute kidney injury with worsened renal function today compared to prior labs, improved from recent hospitalization. Renal dysfunction may be related to recent hospitalization or treatment. - Ordered repeat blood work to assess kidney function next week.  Hypercalcemia Mild hypercalcemia on today's labs, possibly contributing to renal dysfunction. IVF tiday. Repeat labs in 1 week        Visit Diagnosis 1. High grade B-cell lymphoma (HCC)      Dr. Annah Skene, MD, MPH [facility] [phone] 03/26/2024 12:48 PM                   [1] No Known Allergies [2]  Current Outpatient Medications:    cholecalciferol  (VITAMIN D3) 25 MCG (1000 UNIT) tablet, Take 1,000 Units by mouth daily., Disp: , Rfl:    lidocaine  (XYLOCAINE ) 5 % ointment, Apply 1 Application topically 3 (three) times daily as needed., Disp: 35.44 g, Rfl: 0   lisinopril  (ZESTRIL ) 10 MG tablet, Take 1 tablet (10 mg total) by mouth daily. (Patient taking differently: Take 10 mg by mouth every morning.), Disp: 90 tablet, Rfl: 1   Omega-3 Fatty Acids (FISH OIL ADULT GUMMIES PO), Take 1 tablet by mouth daily at 6 (six) AM., Disp: , Rfl:    oxybutynin  (DITROPAN ) 5 MG tablet, Take 1 tablet (5 mg total) by mouth every 8 (eight) hours as needed for bladder spasms., Disp: 30 tablet, Rfl: 0   oxyCODONE -acetaminophen  (PERCOCET/ROXICET) 5-325 MG tablet, Take 1 tablet by mouth every 4 (four) hours as needed for severe pain (pain score 7-10)., Disp: , Rfl:    vitamin C  (ASCORBIC ACID ) 500 MG tablet, Take 500 mg by mouth daily., Disp: , Rfl:    atenolol  (TENORMIN ) 25 MG  tablet, Take 1 tablet (25 mg total) by mouth daily. (Patient not taking: Reported on 03/12/2024), Disp: 90 tablet, Rfl: 1   midodrine  (PROAMATINE ) 5 MG tablet, Take 1 tablet (5 mg total) by mouth 3 (three) times daily with meals. (Patient not taking: Reported on 03/06/2024), Disp: 90 tablet, Rfl: 0 No current facility-administered medications for this visit.  Facility-Administered Medications Ordered in Other Visits:    0.9 %  sodium chloride  infusion, , Intravenous, Continuous, Brahmanday, Govinda R, MD, Stopped at 09/11/20 1502   heparin  lock flush 100 unit/mL, 500 Units, Intravenous, Once, Skene Annah BROCKS, MD   heparin  lock flush 100 unit/mL, 500 Units, Intravenous, Once, Skene Annah BROCKS, MD   methotrexate  (PF) 12 mg in sodium chloride  (PF) 0.9 % INTRATHECAL chemo injection, , Intrathecal, Once, Skene Annah BROCKS, MD   sodium chloride  flush (NS) 0.9 % injection 10 mL, 10 mL, Intracatheter, PRN, Skene Annah BROCKS, MD, 10 mL at 01/18/23 1545   sodium chloride  flush (NS) 0.9 % injection 10 mL, 10 mL, Intravenous, Once, Skene Annah BROCKS, MD  "

## 2024-03-27 ENCOUNTER — Telehealth: Payer: Self-pay

## 2024-03-27 ENCOUNTER — Telehealth: Payer: Self-pay | Admitting: Urology

## 2024-03-27 NOTE — Telephone Encounter (Signed)
 error

## 2024-03-27 NOTE — Telephone Encounter (Signed)
 Message received from [place], [place] at [facility] as follows, he wishes to cancel for Friday as well. He just had surgery last week he said and wasn't sure he was up to the procedure since he was still healing. He asked if we could put him on in a month. Kim dropped him in the depot

## 2024-03-27 NOTE — Telephone Encounter (Signed)
 Pt reports onset of urinary symptoms beginning 03/15/2024, following surgery on 03/12/2024. Symptoms include urethral burning, slow urine stream, difficulty to begin urination requiring straining, and sensation of incomplete bladder emptying. Patient waking up every 15 minutes overnight due to urethral irritation, which is disrupting sleep.  Patient has noticed blood in urine, including blood clots when symptoms first began. Denies fever. Reports experiencing chills at night.  Pt is schedule with Dr.Stoioff tomorrow for a post op visit. Pt aware.

## 2024-03-28 ENCOUNTER — Telehealth: Payer: Self-pay

## 2024-03-28 ENCOUNTER — Other Ambulatory Visit: Payer: Self-pay

## 2024-03-28 ENCOUNTER — Ambulatory Visit: Admitting: Urology

## 2024-03-28 ENCOUNTER — Inpatient Hospital Stay

## 2024-03-28 ENCOUNTER — Encounter: Payer: Self-pay | Admitting: Urology

## 2024-03-28 VITALS — BP 118/73 | HR 75 | Ht 73.0 in | Wt 150.0 lb

## 2024-03-28 DIAGNOSIS — R310 Gross hematuria: Secondary | ICD-10-CM | POA: Diagnosis not present

## 2024-03-28 DIAGNOSIS — Z1211 Encounter for screening for malignant neoplasm of colon: Secondary | ICD-10-CM

## 2024-03-28 DIAGNOSIS — R300 Dysuria: Secondary | ICD-10-CM

## 2024-03-28 LAB — URINALYSIS, COMPLETE
Bilirubin, UA: NEGATIVE
Glucose, UA: NEGATIVE
Nitrite, UA: NEGATIVE
Specific Gravity, UA: 1.02 (ref 1.005–1.030)
Urobilinogen, Ur: 0.2 mg/dL (ref 0.2–1.0)
pH, UA: 7 (ref 5.0–7.5)

## 2024-03-28 LAB — MICROSCOPIC EXAMINATION
RBC, Urine: >30 /HPF — AB (ref 0–2)
WBC, UA: >30 /HPF — AB (ref 0–5)

## 2024-03-28 MED ORDER — CEFUROXIME AXETIL 500 MG PO TABS
500.0000 mg | ORAL_TABLET | Freq: Two times a day (BID) | ORAL | 0 refills | Status: AC
  Filled 2024-03-28: qty 14, 7d supply, fill #0

## 2024-03-28 NOTE — Progress Notes (Signed)
 "  03/28/2024 1:51 PM   [name] 09/12/1960 [id number]  Referring provider: No referring provider defined for this encounter.  Chief Complaint  Patient presents with   Results    HPI: Christopher Mills is a 64 y.o. male who presents for postop follow-up.  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 Cystoscopy under anesthesia 03/12/2024 showed no bladder tumor.  There was moderate lateral lobe enlargement.  There was global bladder mucosal erythema with petechial hemorrhages consistent with hemorrhagic cystitis.  Bladder biopsy was performed with pathology showing granulation tissue consistent with history of hemorrhagic cystitis He was kept on CBI for 24 hours and urine remained clear.  He was discharged catheter free on 03/13/2024. 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   Port-A-Cath in place    Pyuria    Salmonella gastroenteritis    Sepsis secondary to UTI (HCC) 02/29/2024   Thrombocytopenia    Urethral obstruction     Surgical History: Past Surgical History:  Procedure Laterality Date   BONE MARROW BIOPSY     COLONOSCOPY     CYSTOSCOPY N/A 03/12/2024   Procedure: CYSTOSCOPY;  Surgeon: Twylla Glendia BROCKS, MD;  Location: [facility] ORS;  Service: Urology;  Laterality: N/A;   CYSTOSCOPY WITH BIOPSY N/A 03/12/2024   Procedure: CYSTOSCOPY, WITH BIOPSY;  Surgeon: Twylla Glendia BROCKS, MD;  Location: [facility] OR[facility];  Service:  Urology;  Laterality: N/A;   CYSTOSCOPY WITH FULGERATION N/A 03/12/2024   Procedure: CYSTOSCOPY, WITH BLADDER FULGURATION;  Surgeon: Twylla Glendia BROCKS, MD;  Location: [facility] OR[facility];  Service: Urology;  Laterality: N/A;   EXCISION MASS NECK Right 06/12/2020   Procedure: EXCISION MASS NECK;  Surgeon: [name], MD;  Location: [facility] ORS;  Service: General;  Laterality: Right;   HERNIA REPAIR Right    at age 66-RIH   PORTACATH PLACEMENT Right 06/24/2020   Procedure: INSERTION PORT-A-CATH;  Surgeon: [name], MD;  Location: [facility] ORS;  Service: General;  Laterality: Right;    Home Medications:  Allergies as of 03/28/2024   No Known Allergies      Medication List        Accurate as of March 28, 2024  1:51 PM. If you have any questions, ask your nurse or doctor.          STOP taking these medications    atenolol  25 MG tablet Commonly known as: TENORMIN  Stopped by: Glendia Twylla, MD   midodrine  5 MG tablet Commonly known as: PROAMATINE  Stopped by: Glendia Twylla, MD       TAKE these medications    ascorbic acid  500 MG tablet Commonly known as: VITAMIN C  Take 500 mg by mouth daily.   cholecalciferol  25 MCG (1000 UNIT) tablet Commonly known as: VITAMIN D3 Take 1,000 Units by mouth daily.   FISH OIL ADULT GUMMIES PO Take 1 tablet by mouth daily at 6 (six) AM.  lidocaine  5 % ointment Commonly known as: XYLOCAINE  Apply 1 Application topically 3 (three) times daily as needed.   lisinopril  10 MG tablet Commonly known as: ZESTRIL  Take 1 tablet (10 mg total) by mouth daily. What changed: when to take this   Na Sulfate-K Sulfate-Mg Sulfate concentrate 17.5-3.13-1.6 GM/177ML Soln Commonly known as: SUPREP   oxybutynin  5 MG tablet Commonly known as: DITROPAN  Take 1 tablet (5 mg total) by mouth every 8 (eight) hours as needed for bladder spasms.   oxyCODONE -acetaminophen  5-325 MG tablet Commonly known as: PERCOCET/ROXICET Take 1 tablet by mouth every 4 (four)  hours as needed for severe pain (pain score 7-10).        Allergies: Allergies[1]  Family History: Family History  Problem Relation Age of Onset   Anemia Mother    Hypertension Mother    Goiter Mother    Cancer Father    Cancer Sister    Multiple sclerosis Sister     Social History:  reports that he quit smoking about 3 years ago. His smoking use included cigarettes. He has never used smokeless tobacco. He reports that he does not currently use alcohol. He reports that he does not currently use drugs after having used the following drugs: Marijuana.   Physical Exam: BP 118/73   Pulse 75   Ht 6' 1 (1.854 m)   Wt 150 lb (68 kg)   BMI 19.79 kg/m   Constitutional:  Alert, No acute distress. HEENT: [place] AT Respiratory: Normal respiratory effort, no increased work of breathing. Psychiatric: Normal mood and affect.  Laboratory Data:  Urinalysis Dipstick 2+ protein/3+ blood/3+ leukocytes Microscopy >30 WBC/>30 RBC   Assessment & Plan:    1. Gross hematuria  Probable UTI Urine culture ordered Start cefuroxime  500 mg twice daily pending urine C&S Follow-up 1 month with UA/bladder scan Call earlier for worsening voiding symptoms   [name], MD  [facility] [address] [place], [place] [address] [phone]    [1] No Known Allergies  "

## 2024-03-28 NOTE — Telephone Encounter (Signed)
 Patient has been contacted to reschedule his colonoscopy at [facility] with Dr. Melany  03/30/24 to 05/18/24 due to recent urethra surgery.  New order has been entered for procedure.  Instructions updated and printed.  Referral updated.  Message routed to [place] for [place].  Thanks,  Rosaline CMA

## 2024-03-29 ENCOUNTER — Other Ambulatory Visit: Payer: Self-pay

## 2024-03-30 ENCOUNTER — Ambulatory Visit: Admission: RE | Admit: 2024-03-30 | Source: Home / Self Care | Admitting: Gastroenterology

## 2024-03-30 ENCOUNTER — Encounter: Admission: RE | Payer: Self-pay | Source: Home / Self Care

## 2024-03-30 SURGERY — COLONOSCOPY
Anesthesia: Choice

## 2024-03-31 LAB — CULTURE, URINE COMPREHENSIVE

## 2024-04-02 ENCOUNTER — Inpatient Hospital Stay

## 2024-04-02 VITALS — BP 98/71 | HR 92 | Temp 97.8°F | Resp 18

## 2024-04-02 DIAGNOSIS — C8510 Unspecified B-cell lymphoma, unspecified site: Secondary | ICD-10-CM

## 2024-04-02 DIAGNOSIS — C8338 Diffuse large B-cell lymphoma, lymph nodes of multiple sites: Secondary | ICD-10-CM | POA: Diagnosis not present

## 2024-04-02 LAB — CMP (CANCER CENTER ONLY)
ALT: 26 U/L (ref 0–44)
AST: 46 U/L — ABNORMAL HIGH (ref 15–41)
Albumin: 3.4 g/dL — ABNORMAL LOW (ref 3.5–5.0)
Alkaline Phosphatase: 141 U/L — ABNORMAL HIGH (ref 38–126)
Anion gap: 10 (ref 5–15)
BUN: 14 mg/dL (ref 8–23)
CO2: 25 mmol/L (ref 22–32)
Calcium: 9.7 mg/dL (ref 8.9–10.3)
Chloride: 100 mmol/L (ref 98–111)
Creatinine: 1.11 mg/dL (ref 0.61–1.24)
GFR, Estimated: >60 mL/min
Glucose, Bld: 126 mg/dL — ABNORMAL HIGH (ref 70–99)
Potassium: 4.2 mmol/L (ref 3.5–5.1)
Sodium: 135 mmol/L (ref 135–145)
Total Bilirubin: 0.4 mg/dL (ref 0.0–1.2)
Total Protein: 6.2 g/dL — ABNORMAL LOW (ref 6.5–8.1)

## 2024-04-02 MED ORDER — SODIUM CHLORIDE 0.9 % IV SOLN
INTRAVENOUS | Status: DC
  Filled 2024-04-02 (×2): qty 250

## 2024-04-10 ENCOUNTER — Inpatient Hospital Stay (HOSPITAL_BASED_OUTPATIENT_CLINIC_OR_DEPARTMENT_OTHER): Admitting: Nurse Practitioner

## 2024-04-10 ENCOUNTER — Telehealth: Payer: Self-pay

## 2024-04-10 ENCOUNTER — Other Ambulatory Visit: Payer: Self-pay

## 2024-04-10 ENCOUNTER — Inpatient Hospital Stay

## 2024-04-10 VITALS — BP 93/53 | HR 112 | Temp 99.4°F | Resp 20 | Wt 145.5 lb

## 2024-04-10 DIAGNOSIS — R638 Other symptoms and signs concerning food and fluid intake: Secondary | ICD-10-CM | POA: Diagnosis not present

## 2024-04-10 DIAGNOSIS — C8510 Unspecified B-cell lymphoma, unspecified site: Secondary | ICD-10-CM

## 2024-04-10 DIAGNOSIS — R432 Parageusia: Secondary | ICD-10-CM

## 2024-04-10 DIAGNOSIS — R350 Frequency of micturition: Secondary | ICD-10-CM

## 2024-04-10 DIAGNOSIS — C801 Malignant (primary) neoplasm, unspecified: Secondary | ICD-10-CM | POA: Diagnosis not present

## 2024-04-10 DIAGNOSIS — C8338 Diffuse large B-cell lymphoma, lymph nodes of multiple sites: Secondary | ICD-10-CM | POA: Diagnosis not present

## 2024-04-10 LAB — CBC WITH DIFFERENTIAL (CANCER CENTER ONLY)
Abs Immature Granulocytes: 0.03 10*3/uL (ref 0.00–0.07)
Basophils Absolute: 0.1 10*3/uL (ref 0.0–0.1)
Basophils Relative: 2 %
Eosinophils Absolute: 0 10*3/uL (ref 0.0–0.5)
Eosinophils Relative: 1 %
HCT: 29.5 % — ABNORMAL LOW (ref 39.0–52.0)
Hemoglobin: 9.5 g/dL — ABNORMAL LOW (ref 13.0–17.0)
Immature Granulocytes: 1 %
Lymphocytes Relative: 29 %
Lymphs Abs: 0.8 10*3/uL (ref 0.7–4.0)
MCH: 28.5 pg (ref 26.0–34.0)
MCHC: 32.2 g/dL (ref 30.0–36.0)
MCV: 88.6 fL (ref 80.0–100.0)
Monocytes Absolute: 0.3 10*3/uL (ref 0.1–1.0)
Monocytes Relative: 12 %
Neutro Abs: 1.6 10*3/uL — ABNORMAL LOW (ref 1.7–7.7)
Neutrophils Relative %: 55 %
Platelet Count: 231 10*3/uL (ref 150–400)
RBC: 3.33 MIL/uL — ABNORMAL LOW (ref 4.22–5.81)
RDW: 14.9 % (ref 11.5–15.5)
Smear Review: NORMAL
WBC Count: 2.8 10*3/uL — ABNORMAL LOW (ref 4.0–10.5)
nRBC: 0 % (ref 0.0–0.2)

## 2024-04-10 LAB — CMP (CANCER CENTER ONLY)
ALT: 28 U/L (ref 0–44)
AST: 41 U/L (ref 15–41)
Albumin: 3.7 g/dL (ref 3.5–5.0)
Alkaline Phosphatase: 156 U/L — ABNORMAL HIGH (ref 38–126)
Anion gap: 13 (ref 5–15)
BUN: 23 mg/dL (ref 8–23)
CO2: 23 mmol/L (ref 22–32)
Calcium: 10 mg/dL (ref 8.9–10.3)
Chloride: 99 mmol/L (ref 98–111)
Creatinine: 1.71 mg/dL — ABNORMAL HIGH (ref 0.61–1.24)
GFR, Estimated: 44 mL/min — ABNORMAL LOW
Glucose, Bld: 95 mg/dL (ref 70–99)
Potassium: 4.3 mmol/L (ref 3.5–5.1)
Sodium: 134 mmol/L — ABNORMAL LOW (ref 135–145)
Total Bilirubin: 0.9 mg/dL (ref 0.0–1.2)
Total Protein: 7 g/dL (ref 6.5–8.1)

## 2024-04-10 MED ORDER — OLANZAPINE 5 MG PO TABS
5.0000 mg | ORAL_TABLET | Freq: Every day | ORAL | 0 refills | Status: DC
  Filled 2024-04-10: qty 30, 30d supply, fill #0

## 2024-04-10 MED ORDER — SODIUM CHLORIDE 0.9 % IV SOLN
INTRAVENOUS | Status: DC
  Filled 2024-04-10 (×2): qty 250

## 2024-04-10 NOTE — Progress Notes (Signed)
 Patient complains of weakness.  BP 93/53, [id number].  Complains of constipation, with no bowel movement in 1 week.  Complains of urinary frequency with burning and cloudy urine, also feels he does not empty his bladder.

## 2024-04-10 NOTE — Progress Notes (Signed)
 "  Symptom Management Clinic  [facility] at [facility] A Department of the [place]. [facility] [address] 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   Chief complaint/ Reason for visit- Weakness   Heme/Onc history:  Oncology History  High grade B-cell lymphoma (HCC)  06/18/2020 Initial Diagnosis   High grade B-cell lymphoma (HCC)   06/18/2020 Cancer Staging   Staging form: Hodgkin and Non-Hodgkin Lymphoma, AJCC 8th Edition - Clinical stage from 06/18/2020: Stage IV (Diffuse large B-cell lymphoma) - Signed by [name], MD on 06/18/2020 Stage prefix: Initial diagnosis   06/21/2020 - 06/21/2020 Chemotherapy         07/03/2020 - 07/08/2020 Chemotherapy         08/12/2020 - 12/05/2020 Chemotherapy   Patient is on Treatment Plan :  NON-HODGKINS LYMPHOMA R-EPOCH q21d     08/17/2021 - 10/12/2021 Chemotherapy   Patient is on Treatment Plan : NON-HODGKIN'S LYMPHOMA DLBCL RELAPSED/REFRACTORY Tafasitamab -cxix + Lenalidomide  q28d / Tafasitamab -cxix Maintenance q28d     10/29/2021 - 11/02/2021 Chemotherapy   Patient is on Treatment Plan : NON-HODGKINS LYMPHOMA RELAPSED/ REFRACTORY DLBCL Polatuzumab + Bendamustine  + Rituximab  q21d x 6 cycles     11/12/2021 - 11/12/2021 Chemotherapy   Patient is on Treatment Plan : NON-HODGKINS LYMPHOMA RELAPSED/ REFRACTORY DLBCL Polatuzumab D1 + Bendamustine  D1,2 + Rituximab  D1 q21d x 6 cycles     11/18/2021 - 01/01/2022 Chemotherapy   Patient is on Treatment Plan : NON-HODGKINS LYMPHOMA RELAPSED/ REFRACTORY DLBCL Polatuzumab D1 + Bendamustine  D1,2 + Rituximab  D1 q21d x 6  cycles     08/24/2022 -  Chemotherapy   Patient is on Treatment Plan : LYMPHOMA Loncastuximab tesirine  D1 q21d       Interval history- Christopher Mills is a 64 y.o. male with above history of relapsed/refractory non-hodgkin lymphoma who presents to symptom management clinic for complaints of generalized weakness. Appetite is poor and food tastes bland. Weight is down. He has ongoing dysuria and sensation of incomplete emptying. He was treated for a uti by Dr Twylla and would like to have urine re-checked today. No fevers or chills. Denies diarrhea.   Review of systems- Review of Systems  Constitutional:  Positive for malaise/fatigue and weight loss. Negative for chills and fever.  HENT:  Negative for hearing loss, nosebleeds, sore throat and tinnitus.   Eyes:  Negative for blurred vision and double vision.  Respiratory:  Negative for cough, hemoptysis, shortness of breath and wheezing.   Cardiovascular:  Negative for chest pain, palpitations and leg swelling.  Gastrointestinal:  Positive for constipation. Negative for abdominal pain, blood in stool, diarrhea, melena, nausea and vomiting.  Genitourinary:  Positive for dysuria and urgency.  Musculoskeletal:  Negative for back pain, falls, joint pain and myalgias.  Skin:  Negative for itching and rash.  Neurological:  Positive for weakness. Negative for dizziness, tingling, sensory change, loss of consciousness and headaches.  Endo/Heme/Allergies:  Negative for environmental allergies. Does not bruise/bleed easily.  Psychiatric/Behavioral:  Negative for depression. The patient is not nervous/anxious and does not have insomnia.      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   Port-A-Cath in place    Pyuria    Salmonella gastroenteritis    Sepsis secondary to UTI (HCC) 02/29/2024   Thrombocytopenia    Urethral obstruction     Past Surgical History:  Procedure Laterality Date   BONE MARROW BIOPSY     COLONOSCOPY     CYSTOSCOPY N/A 03/12/2024   Procedure: CYSTOSCOPY;  Surgeon: Twylla Glendia BROCKS, MD;  Location: [facility] ORS;  Service: Urology;  Laterality: N/A;   CYSTOSCOPY WITH BIOPSY N/A 03/12/2024   Procedure: CYSTOSCOPY, WITH BIOPSY;  Surgeon: Twylla Glendia BROCKS, MD;  Location: [facility];  Service: Urology;  Laterality: N/A;   CYSTOSCOPY WITH FULGERATION N/A 03/12/2024   Procedure: CYSTOSCOPY, WITH BLADDER FULGURATION;  Surgeon: Twylla Glendia BROCKS, MD;  Location: [facility] OR[facility];  Service: Urology;  Laterality: N/A;   EXCISION MASS NECK Right 06/12/2020   Procedure: EXCISION MASS NECK;  Surgeon: [name], MD;  Location: [facility] OR[facility];  Service: General;  Laterality: Right;   HERNIA REPAIR Right    at age 40-RIH   PORTACATH PLACEMENT Right 06/24/2020   Procedure: INSERTION PORT-A-CATH;  Surgeon: [name], MD;  Location: [facility] OR[facility];  Service: General;  Laterality: Right;    Social History   Socioeconomic History   Marital status: Single    Spouse name: Not on file   Number of children: Not on file   Years of education: Not on file   Highest education level: Not on file  Occupational History   Not on file  Tobacco Use   Smoking status: Former    Current packs/day: 0.00    Types: Cigarettes    Quit date: 06/13/2020    Years since quitting: 3.8   Smokeless tobacco: Never  Vaping Use   Vaping status: Never Used  Substance and Sexual Activity   Alcohol use: Not Currently   Drug use: Not Currently    Types: Marijuana   Sexual activity: Not Currently  Other Topics Concern   Not on file  Social History Narrative   Has daughter and grandson in the home   Social Drivers of Health   Tobacco Use: Medium Risk (03/28/2024)    Patient History    Smoking Tobacco Use: Former    Smokeless Tobacco Use: Never    Passive Exposure: Not on file  Financial Resource Strain: Medium Risk (07/30/2021)   Received from [facility]   Overall Financial Resource Strain (CARDIA)    Difficulty of Paying Living Expenses: Somewhat hard  Food Insecurity: No Food Insecurity (03/12/2024)   Epic    Worried About Radiation Protection Practitioner of Food in the Last Year: Never true    Ran Out of Food in the Last Year: Never true  Transportation Needs: No Transportation Needs (03/12/2024)   Epic    Lack of Transportation (Medical): No    Lack of Transportation (Non-Medical): No  Physical Activity: Not on file  Stress: No Stress Concern Present (07/30/2021)   Received from [facility] - Occupational Stress Questionnaire    Feeling of Stress : Not at all  Social Connections: Not on file  Intimate Partner Violence: Not At Risk (03/12/2024)   Epic    Fear of Current or Ex-Partner: No    Emotionally Abused: No    Physically Abused: No    Sexually Abused: No  Depression (PHQ2-9): Low Risk (04/02/2024)   Depression (PHQ2-9)    PHQ-2 Score: 0  Alcohol Screen: Not on file  Housing: Low Risk (03/12/2024)   Epic    Unable to Pay for Housing in the Last Year: No    Number of Times Moved in the Last Year: 0    Homeless in the Last Year: No  Utilities: Not At Risk (03/12/2024)   Epic    Threatened with loss of utilities: No  Health Literacy: Low Risk (07/30/2021)   Received from [facility] Literacy    How often do you need to have someone help you when you read instructions, pamphlets, or other written material from your doctor or pharmacy?: Never    Family History  Problem Relation Age of Onset   Anemia Mother    Hypertension Mother    Goiter Mother    Cancer Father    Cancer Sister    Multiple sclerosis Sister     Current Medications[2]  Physical exam:  Vitals:   04/10/24 1449   BP: (!) 93/53  Pulse: (!) 112  Resp: 20  Temp: 99.4 F (37.4 C)  TempSrc: Tympanic  SpO2: 100%  Weight: 145 lb 8 oz (66 kg)   Physical Exam Vitals reviewed.  Constitutional:      General: He is not in acute distress.    Appearance: He is well-developed.     Comments: Chronically ill appearing.   HENT:     Head: Normocephalic and atraumatic.     Mouth/Throat:     Pharynx: No oropharyngeal exudate.  Eyes:     General: No scleral icterus.    Pupils: Pupils are equal, round, and reactive to light.  Cardiovascular:     Rate and Rhythm: Normal rate and regular rhythm.  Pulmonary:     Effort: Pulmonary effort is normal.     Breath sounds: Normal breath sounds. No wheezing.  Abdominal:     General: There is no distension.     Palpations: Abdomen is soft.     Tenderness: There is no abdominal tenderness.  Musculoskeletal:     Cervical back: Normal range of motion and neck supple.  Skin:    General: Skin is warm and dry.  Neurological:     Mental Status: He is alert and oriented to person, place, and time.  Psychiatric:        Behavior: Behavior normal.        Latest Ref Rng & Units 04/10/2024    2:25 PM  CMP  Glucose 70 - 99 mg/dL 95   BUN 8 - 23 mg/dL 23   Creatinine 9.38 - 1.24 mg/dL 8.28   Sodium 864 - 854 mmol/L 134   Potassium 3.5 - 5.1 mmol/L 4.3   Chloride 98 - 111 mmol/L 99   CO2 22 - 32 mmol/L 23   Calcium 8.9 - 10.3 mg/dL 89.9   Total Protein 6.5 - 8.1 g/dL 7.0   Total Bilirubin 0.0 - 1.2 mg/dL 0.9   Alkaline Phos 38 - 126 U/L 156   AST 15 - 41 U/L 41   ALT 0 - 44 U/L 28       Latest Ref Rng & Units 04/10/2024    2:25 PM  CBC  WBC 4.0 - 10.5 K/uL 2.8   Hemoglobin 13.0 - 17.0 g/dL 9.5   Hematocrit 60.9 - 52.0 % 29.5   Platelets 150 - 400 K/uL 231    No results found.  Assessment and plan- Patient is a 64 y.o. male diagnosed with relapsed/refractory non-hodgkin lymphoma who presents to symptom management clinic for complaints of:  Weakness-  generalized. Recommend imaging. He has pet scheduled. Continue to hold antihypertensives. Push fluids.  Weight loss/taste changes- trial olanzapine  5 mg at bedtime for appetite. Recommend he try supplements AKI- plan for IV fluids today Hemorrhagic cystitis- recently treated for uti. Urine culture is negative. Recommended he reach out to urology to move up appointment.   Disposition:  IV fluids today Start olanzapine  Follow up as scheduled or return to clinic if symptoms do not improve or worsen in interim.   Visit Diagnosis 1. Dysgeusia   2. Altered nutrition in cancer patient (HCC)   3. High grade B-cell lymphoma (HCC)    Patient expressed understanding and was in agreement with this plan. He also understands that He can call clinic at any time with any questions, concerns, or complaints.   Thank you for allowing me to participate in the care of this very pleasant patient.   Tinnie Dawn, DNP, AGNP-C, AOCNP [facility] [phone]     [1] No Known Allergies [2]  Current Outpatient Medications:    cholecalciferol  (VITAMIN D3) 25 MCG (1000 UNIT) tablet, Take 1,000 Units by mouth daily., Disp: , Rfl:    lidocaine  (XYLOCAINE ) 5 % ointment, Apply 1 Application topically 3 (three) times daily as needed., Disp: 35.44 g, Rfl: 0   oxyCODONE -acetaminophen  (PERCOCET/ROXICET) 5-325 MG tablet, Take 1 tablet by mouth every 4 (four) hours as needed for severe pain (pain score 7-10)., Disp: , Rfl:    vitamin C  (ASCORBIC ACID ) 500 MG tablet, Take 500 mg by mouth daily., Disp: , Rfl:    lisinopril  (ZESTRIL ) 10 MG tablet, Take 1 tablet (10 mg total) by mouth daily. (Patient not taking: Reported on 04/10/2024), Disp: 90 tablet, Rfl: 1   Na Sulfate-K Sulfate-Mg Sulfate concentrate (SUPREP) 17.5-3.13-1.6 GM/177ML SOLN, , Disp: , Rfl:    Omega-3 Fatty Acids (FISH OIL ADULT GUMMIES PO), Take 1 tablet by mouth daily at 6 (six) AM. (Patient not taking: Reported on 04/10/2024), Disp: ,  Rfl:    oxybutynin  (DITROPAN ) 5 MG tablet, Take 1 tablet (5 mg total) by mouth every 8 (eight) hours as needed for bladder spasms. (Patient not taking: Reported on 04/10/2024), Disp: 30 tablet, Rfl: 0 No current facility-administered medications for this visit.  Facility-Administered Medications Ordered in Other Visits:    0.9 %  sodium chloride  infusion, , Intravenous, Continuous, Brahmanday, Govinda R, MD, Stopped at 09/11/20 1502   heparin  lock flush 100 unit/mL, 500 Units, Intravenous, Once, [name], MD   heparin  lock flush 100 unit/mL, 500 Units, Intravenous, Once, [name], MD   methotrexate  (PF) 12 mg in sodium chloride  (PF) 0.9 % INTRATHECAL chemo injection, , Intrathecal, Once, [name], MD   sodium chloride  flush (NS) 0.9 % injection 10 mL, 10 mL, Intracatheter, PRN, [name], MD, 10 mL at 01/18/23 1545   sodium chloride  flush (NS) 0.9 % injection 10 mL, 10 mL, Intravenous, Once, [name], MD  "

## 2024-04-10 NOTE — Telephone Encounter (Signed)
 Patient called the triage line reporting symptoms of a UTI. Patient reports dysuria, chills, and states that after urination he feels tired and short of breath. Patient states he has an appointment today with the cancer center for lab work. Patient was advised to inform the cancer center staff of his UTI symptoms so a urinalysis could be obtained along with scheduled labs. Patient verbalized understanding.

## 2024-04-10 NOTE — Telephone Encounter (Signed)
 Received voicemail from patient today 04/10/24 at 9:22am requesting a call back at [phone]; no additional information provided.  Outbound call; says he's so tired and blood pressure is low 94/67 this morning.  Patient says he needs fluids.  Patient will see Ms. Allen today at 2:15pm with labs prior.

## 2024-04-10 NOTE — Addendum Note (Signed)
 Addended by: FAUSTINO BENCE on: 04/10/2024 12:10 PM   Modules accepted: Orders

## 2024-04-11 ENCOUNTER — Ambulatory Visit

## 2024-04-11 ENCOUNTER — Other Ambulatory Visit: Payer: Self-pay

## 2024-04-11 ENCOUNTER — Telehealth: Payer: Self-pay | Admitting: *Deleted

## 2024-04-11 DIAGNOSIS — C8510 Unspecified B-cell lymphoma, unspecified site: Secondary | ICD-10-CM

## 2024-04-11 LAB — URINE CULTURE: Culture: NO GROWTH

## 2024-04-11 NOTE — Telephone Encounter (Signed)
 Patient called to get u/a and urine culture results. No U/A sent, only urine culture per chart. Pt made aware that results for urine culture still pending. Pt is requesting antibiotics for the dysuria.     NURSING SYMPTOM TRIAGE ASSESSMENT & DISPOSITION  Mode of interaction:  Telephone Date of symptom triage interaction:  04/11/24 Time of symptom triage interaction:  1522  Cancer diagnosis:  No diagnosis found. Current anti-cancer treatment:  Patient is on Treatment Plan :  LYMPHOMA Loncastuximab tesirine  D1 q21d  Last treatment date:  02/15/24 Oral chemotherapy:  No  Symptom Triage Protocol:  Dysuria  History of the problem:  Dysuria Fluid intake over past 24 hours:  6 bottles of water  and Gatorade and cranberry juice.  Having gastrointestinal complaints?  No  Experiencing lightheadedness or dizziness?  No  Pregnancy status:  N/a  Pain on urination, before starting a stream and after?  Yes: hurts to urinate; Slow stream dribbling at times  Frequency of urination over 48 hours:  Every 20-30 mins  Having sensation bladder not emptying completely?  Yes: frequently  Last urination:  Today  Color of urine:   Sediment present in urine:  No  Odor of urine:   yes  Discharge from urinary structures?  No  Fevers or chills?  No  Precipitating factors:    Onset:   X 1 week  Duration:     Relieving factors:   none  Smoking or alcohol intake?  No  Ability to start and end stream?  Yes  Lower back pain?  Yes: mild flank pain  Associated symptoms:  Frequent urination     Pertinent details: - Cancer history - Recent urinary structure procedures - UTI, sequelae if known - Sexual history H/o lymphoma; h/o of cystitis- has been followed by urology.     Changes in ADLs:   No     Additional narrative:        Triage Nurse Guidance Signs and Symptoms Action  Urinary retention Acute severe flank or back pain Lower extremity weakness Temperature higher than 101.5 F without suspected  neutropenia Temperature higher than 100.4 F with suspected neutropenia Chills, malaise, rigors Seek emergency care.  Call an ambulance immediately.   Hematuria Dysuria Burning with urination Frequent urination, nocturia Cloudy or malodorous urine or discharge Suprapubic pain or tenderness Unable to urinate for more than eight (8) hours Flu-like symptoms for longer than 72 hours Joint pain, cough, or rash after bacillus Carlotta Guerin treatment Seek urgent care within 24 hours.   If the patient has received recent bacillus Calmette-Guerin treatment Dysuria, burning, or difficulty urinating Frequent urination, nocturia Sense of incomplete voiding Slow stream, dribbling Follow homecare instructions.  Notify MD if no improvement.     Provider Consulted  Provider name and credentials: Tinnie Dawn NP  Provider instruction: See provider comment above. U/a culture pending. F/u with urology   Patient Instruction  Disclaimer:  Patient specific and [employer] instruction provided verbally and sent through MyChart for active users.    Drink 8-10 eight-ounce glasses of fluid each day (unless contraindicated).  Alcohol is not included in hydration amounts; remember to report consumption to clinician. Cranberry juice may reduce bacterial adherence to bladder wall.  Avoid caffeinated and acidic beverages, as they can be irritants or stimulants.  Females should cleanse the genital area from front to back.  Avoid urinary stasis by urinating frequently.  Postcoital urination is suggested.  Take showers instead of tub baths to decrease infection risk.  Monitor urinary output.  Practice pelvic floor exercises.  Use of a voiding diary may be helpful.  Seek Emergency Care Immediately if Any of the Following Occurs: Shortness of breath or difficulty breathing  Loss of consciousness, unresponsiveness, or changes in cognition  Temperature elevation that persists 48-72 hours after initiation of  treatment  Development of fever, chills, or rigors  Inability to pass urine  Teach Back Method used:  Yes    Nurse Triage Priority:  Non-urgent (review and reinforce self-care strategies)  Barriers to Care:  None identified  Nurse Triage Disposition:  Home care, return call if no improvement within 24 hours--follow-up with urology regarding concerns and wait on final results for urine culture.  Protocol Source:  Ruthine HERO., & Eldonna CANDIE Pee.). (2019). Telephone triage for oncology nurses (3rd ed.). Oncology Nursing Society.  Patient calling triage.

## 2024-04-11 NOTE — Telephone Encounter (Signed)
 Pt Christopher Mills requesting u/a results.   Pt gave urine sample at [facility]. Urine culture in process. Pt aware results may take 3-7 days. CC will contact with results. Pt voiced understanding.

## 2024-04-11 NOTE — Telephone Encounter (Signed)
 Pt called again requesting meds for burning w/urination.   NO fever Minimal blood in urine on/off + pushing fluid   Advised pt to try AZO. Per SCS will wait for ucx for next steps.

## 2024-04-13 ENCOUNTER — Encounter: Payer: Self-pay | Admitting: Oncology

## 2024-04-13 ENCOUNTER — Telehealth: Payer: Self-pay | Admitting: Nurse Practitioner

## 2024-04-13 NOTE — Telephone Encounter (Signed)
 Spoke to patient by phone. Reviewed that urine culture was negative for infection. He has ongoing dysuria. Recommended he contact urology for re-evaluation.

## 2024-04-16 ENCOUNTER — Emergency Department

## 2024-04-16 ENCOUNTER — Other Ambulatory Visit: Payer: Self-pay

## 2024-04-16 ENCOUNTER — Inpatient Hospital Stay: Admission: EM | Admit: 2024-04-16 | Discharge: 2024-04-19 | DRG: 872 | Disposition: A

## 2024-04-16 DIAGNOSIS — N179 Acute kidney failure, unspecified: Secondary | ICD-10-CM | POA: Diagnosis present

## 2024-04-16 DIAGNOSIS — E860 Dehydration: Secondary | ICD-10-CM | POA: Diagnosis present

## 2024-04-16 DIAGNOSIS — C8510 Unspecified B-cell lymphoma, unspecified site: Secondary | ICD-10-CM | POA: Diagnosis present

## 2024-04-16 DIAGNOSIS — Z820 Family history of epilepsy and other diseases of the nervous system: Secondary | ICD-10-CM

## 2024-04-16 DIAGNOSIS — A419 Sepsis, unspecified organism: Principal | ICD-10-CM | POA: Diagnosis present

## 2024-04-16 DIAGNOSIS — N10 Acute tubulo-interstitial nephritis: Secondary | ICD-10-CM | POA: Diagnosis not present

## 2024-04-16 DIAGNOSIS — C9590 Leukemia, unspecified not having achieved remission: Secondary | ICD-10-CM

## 2024-04-16 DIAGNOSIS — Z8249 Family history of ischemic heart disease and other diseases of the circulatory system: Secondary | ICD-10-CM

## 2024-04-16 DIAGNOSIS — Z79899 Other long term (current) drug therapy: Secondary | ICD-10-CM

## 2024-04-16 DIAGNOSIS — R636 Underweight: Secondary | ICD-10-CM | POA: Diagnosis present

## 2024-04-16 DIAGNOSIS — N412 Abscess of prostate: Secondary | ICD-10-CM | POA: Diagnosis present

## 2024-04-16 DIAGNOSIS — D630 Anemia in neoplastic disease: Secondary | ICD-10-CM | POA: Diagnosis present

## 2024-04-16 DIAGNOSIS — Z87891 Personal history of nicotine dependence: Secondary | ICD-10-CM

## 2024-04-16 DIAGNOSIS — Z87440 Personal history of urinary (tract) infections: Secondary | ICD-10-CM

## 2024-04-16 DIAGNOSIS — D72819 Decreased white blood cell count, unspecified: Secondary | ICD-10-CM | POA: Diagnosis present

## 2024-04-16 DIAGNOSIS — N3001 Acute cystitis with hematuria: Secondary | ICD-10-CM | POA: Diagnosis present

## 2024-04-16 DIAGNOSIS — N12 Tubulo-interstitial nephritis, not specified as acute or chronic: Secondary | ICD-10-CM

## 2024-04-16 DIAGNOSIS — Z9221 Personal history of antineoplastic chemotherapy: Secondary | ICD-10-CM

## 2024-04-16 DIAGNOSIS — C8331 Diffuse large B-cell lymphoma, lymph nodes of head, face, and neck: Secondary | ICD-10-CM | POA: Diagnosis present

## 2024-04-16 DIAGNOSIS — I10 Essential (primary) hypertension: Secondary | ICD-10-CM | POA: Diagnosis present

## 2024-04-16 DIAGNOSIS — R5383 Other fatigue: Secondary | ICD-10-CM | POA: Diagnosis present

## 2024-04-16 DIAGNOSIS — Z681 Body mass index (BMI) 19 or less, adult: Secondary | ICD-10-CM

## 2024-04-16 DIAGNOSIS — Z1152 Encounter for screening for COVID-19: Secondary | ICD-10-CM

## 2024-04-16 LAB — CBC
HCT: 34.2 % — ABNORMAL LOW (ref 39.0–52.0)
HCT: 26 % — ABNORMAL LOW (ref 39.0–52.0)
Hemoglobin: 11 g/dL — ABNORMAL LOW (ref 13.0–17.0)
Hemoglobin: 8.5 g/dL — ABNORMAL LOW (ref 13.0–17.0)
MCH: 27.8 pg (ref 26.0–34.0)
MCH: 28 pg (ref 26.0–34.0)
MCHC: 32.2 g/dL (ref 30.0–36.0)
MCHC: 32.7 g/dL (ref 30.0–36.0)
MCV: 86.4 fL (ref 80.0–100.0)
MCV: 85.5 fL (ref 80.0–100.0)
Platelets: 281 10*3/uL (ref 150–400)
Platelets: 187 10*3/uL (ref 150–400)
RBC: 3.96 MIL/uL — ABNORMAL LOW (ref 4.22–5.81)
RBC: 3.04 MIL/uL — ABNORMAL LOW (ref 4.22–5.81)
RDW: 14.6 % (ref 11.5–15.5)
RDW: 14.6 % (ref 11.5–15.5)
WBC: 2.8 10*3/uL — ABNORMAL LOW (ref 4.0–10.5)
WBC: 2.7 10*3/uL — ABNORMAL LOW (ref 4.0–10.5)
nRBC: 0 % (ref 0.0–0.2)
nRBC: 0 % (ref 0.0–0.2)

## 2024-04-16 LAB — COMPREHENSIVE METABOLIC PANEL WITH GFR
ALT: 42 U/L (ref 0–44)
AST: 72 U/L — ABNORMAL HIGH (ref 15–41)
Albumin: 3.8 g/dL (ref 3.5–5.0)
Alkaline Phosphatase: 151 U/L — ABNORMAL HIGH (ref 38–126)
Anion gap: 17 — ABNORMAL HIGH (ref 5–15)
BUN: 31 mg/dL — ABNORMAL HIGH (ref 8–23)
CO2: 21 mmol/L — ABNORMAL LOW (ref 22–32)
Calcium: 10.5 mg/dL — ABNORMAL HIGH (ref 8.9–10.3)
Chloride: 95 mmol/L — ABNORMAL LOW (ref 98–111)
Creatinine, Ser: 2.21 mg/dL — ABNORMAL HIGH (ref 0.61–1.24)
GFR, Estimated: 32 mL/min — ABNORMAL LOW
Glucose, Bld: 102 mg/dL — ABNORMAL HIGH (ref 70–99)
Potassium: 4.5 mmol/L (ref 3.5–5.1)
Sodium: 133 mmol/L — ABNORMAL LOW (ref 135–145)
Total Bilirubin: 0.6 mg/dL (ref 0.0–1.2)
Total Protein: 7.6 g/dL (ref 6.5–8.1)

## 2024-04-16 LAB — URINALYSIS, ROUTINE W REFLEX MICROSCOPIC
Bilirubin Urine: NEGATIVE
Glucose, UA: NEGATIVE mg/dL
Ketones, ur: NEGATIVE mg/dL
Nitrite: NEGATIVE
Protein, ur: 100 mg/dL — AB
RBC / HPF: >50 RBC/hpf (ref 0–5)
Specific Gravity, Urine: 1.016 (ref 1.005–1.030)
Squamous Epithelial / HPF: 0 /HPF (ref 0–5)
WBC, UA: >50 WBC/hpf (ref 0–5)
pH: 8 (ref 5.0–8.0)

## 2024-04-16 LAB — CREATININE, SERUM
Creatinine, Ser: 1.96 mg/dL — ABNORMAL HIGH (ref 0.61–1.24)
GFR, Estimated: 37 mL/min — ABNORMAL LOW

## 2024-04-16 LAB — PROTIME-INR
INR: 1 (ref 0.8–1.2)
Prothrombin Time: 13.9 s (ref 11.4–15.2)

## 2024-04-16 LAB — RESP PANEL BY RT-PCR (RSV, FLU A&B, COVID)  RVPGX2
Influenza A by PCR: NEGATIVE
Influenza B by PCR: NEGATIVE
Resp Syncytial Virus by PCR: NEGATIVE
SARS Coronavirus 2 by RT PCR: NEGATIVE

## 2024-04-16 LAB — LACTIC ACID, PLASMA
Lactic Acid, Venous: 2 mmol/L (ref 0.5–1.9)
Lactic Acid, Venous: 1.8 mmol/L (ref 0.5–1.9)

## 2024-04-16 MED ORDER — MORPHINE SULFATE (PF) 2 MG/ML IV SOLN: 2.0000 mg | INTRAVENOUS | Status: DC | PRN

## 2024-04-16 MED ORDER — ENOXAPARIN SODIUM 40 MG/0.4ML IJ SOSY
40.0000 mg | PREFILLED_SYRINGE | INTRAMUSCULAR | Status: DC
  Administered 2024-04-16 – 2024-04-18 (×3): 40 mg via SUBCUTANEOUS
  Filled 2024-04-16 (×3): qty 0.4

## 2024-04-16 MED ORDER — ONDANSETRON HCL 4 MG/2ML IJ SOLN: 4.0000 mg | Freq: Four times a day (QID) | INTRAMUSCULAR | Status: DC | PRN

## 2024-04-16 MED ORDER — METRONIDAZOLE 500 MG/100ML IV SOLN
500.0000 mg | Freq: Once | INTRAVENOUS | Status: AC
  Administered 2024-04-16: 500 mg via INTRAVENOUS
  Filled 2024-04-16: qty 100

## 2024-04-16 MED ORDER — LACTATED RINGERS IV SOLN: INTRAVENOUS | Status: AC

## 2024-04-16 MED ORDER — SODIUM CHLORIDE 0.9 % IV SOLN
2.0000 g | Freq: Once | INTRAVENOUS | Status: AC
  Administered 2024-04-16: 2 g via INTRAVENOUS
  Filled 2024-04-16: qty 12.5

## 2024-04-16 MED ORDER — SODIUM CHLORIDE 0.9 % IV SOLN: INTRAVENOUS | Status: DC

## 2024-04-16 MED ORDER — IOHEXOL 300 MG/ML  SOLN
80.0000 mL | Freq: Once | INTRAMUSCULAR | Status: AC | PRN
  Administered 2024-04-16: 80 mL via INTRAVENOUS

## 2024-04-16 MED ORDER — ACETAMINOPHEN 325 MG PO TABS: 650.0000 mg | ORAL_TABLET | Freq: Four times a day (QID) | ORAL | Status: DC | PRN

## 2024-04-16 MED ORDER — ONDANSETRON HCL 4 MG PO TABS: 4.0000 mg | ORAL_TABLET | Freq: Four times a day (QID) | ORAL | Status: DC | PRN

## 2024-04-16 MED ORDER — VANCOMYCIN HCL IN DEXTROSE 1-5 GM/200ML-% IV SOLN
1000.0000 mg | Freq: Once | INTRAVENOUS | Status: AC
  Administered 2024-04-16: 1000 mg via INTRAVENOUS
  Filled 2024-04-16: qty 200

## 2024-04-16 MED ORDER — LACTATED RINGERS IV BOLUS (SEPSIS)
1000.0000 mL | Freq: Once | INTRAVENOUS | Status: AC
  Administered 2024-04-16: 1000 mL via INTRAVENOUS

## 2024-04-16 MED ORDER — ACETAMINOPHEN 650 MG RE SUPP: 650.0000 mg | Freq: Four times a day (QID) | RECTAL | Status: DC | PRN

## 2024-04-16 MED ORDER — OXYCODONE HCL 5 MG PO TABS: 5.0000 mg | ORAL_TABLET | ORAL | Status: DC | PRN

## 2024-04-16 MED ORDER — POLYETHYLENE GLYCOL 3350 17 G PO PACK: 17.0000 g | PACK | Freq: Every day | ORAL | Status: DC | PRN

## 2024-04-16 NOTE — ED Provider Notes (Signed)
----------------------------------------- °  4:01 PM on 04/16/2024 ----------------------------------------- Patient received in turnover from Dr. Fernand pending CT imaging.  CT concerning for cystitis as well as pyelonephritis and prostatic abscess.  He has already received broad-spectrum antibiotics and vital signs are improving following IV fluid resuscitation.  Patient has had difficulty providing urine sample, but was just able to do so and will send for UA and culture.  Case discussed with Dr. Twylla of urology, states no treatment needed beyond the antibiotics at this time.  Case discussed with hospitalist for admission.   [name], MD 04/16/24 [phone]

## 2024-04-16 NOTE — ED Provider Notes (Signed)
 "  [facility] Provider Note    Event Date/Time   First MD Initiated Contact with Patient 04/16/24 1323     (approximate)   History   Fatigue   HPI  Christopher Mills is a 64 y.o. male presenting with concern of fatigue.  History of underlying lymphoma and recently status post cystoscopy.  Since his cystoscopy he has been having persistent burning with urination, also complaining of increased weakness and lightheadedness.  Has been checking his blood pressure at home yesterday noticed his heart rate to be significantly elevated and his blood pressure to be low because he become concerned decided present today for further assessment.  He did receive a call from his urologist yesterday informing that he had had a negative urine culture after he had recently been evaluated by them and complained of similar findings.  He denies any fevers associated with his symptoms, wife who is at bedside endorses lack of appetite over the last few days.  He does complain of pain along his lower abdomen.  He has not received chemotherapy throughout the entire month of January.     Physical Exam   Triage Vital Signs: ED Triage Vitals  Encounter Vitals Group     BP 04/16/24 1311 (!) 88/67     Girls Systolic BP Percentile --      Girls Diastolic BP Percentile --      Boys Systolic BP Percentile --      Boys Diastolic BP Percentile --      Pulse Rate 04/16/24 1307 (!) (P) 134     Resp 04/16/24 1315 18     Temp 04/16/24 1307 (P) 98.6 F (37 C)     Temp Source 04/16/24 1307 (P) Oral     SpO2 04/16/24 1307 (P) 100 %     Weight 04/16/24 1312 145 lb (65.8 kg)     Height --      Head Circumference --      Peak Flow --      Pain Score 04/16/24 1312 0     Pain Loc --      Pain Education --      Exclude from Growth Chart --     Most recent vital signs: Vitals:   04/16/24 1311 04/16/24 1315  BP: (!) 88/67   Pulse: (!) 132   Resp:  18  Temp: 98.6 F (37 C)   SpO2: 100%       General: Awake, no distress.  CV:  Good peripheral perfusion.  Resp:  Normal effort.  Speaking full sentences, lungs clear to auscultation bilaterally Abd:  No distention.  Soft nontender, penis appears normal, no noted scrotal tenderness and bilateral cremasteric reflexes present Other:     ED Results / Procedures / Treatments   Labs (all labs ordered are listed, but only abnormal results are displayed) Labs Reviewed  COMPREHENSIVE METABOLIC PANEL WITH GFR - Abnormal; Notable for the following components:      Result Value   Sodium 133 (*)    Chloride 95 (*)    CO2 21 (*)    Glucose, Bld 102 (*)    BUN 31 (*)    Creatinine, Ser 2.21 (*)    Calcium 10.5 (*)    AST 72 (*)    Alkaline Phosphatase 151 (*)    GFR, Estimated 32 (*)    Anion gap 17 (*)    All other components within normal limits  CBC - Abnormal; Notable for the following  components:   WBC 2.8 (*)    RBC 3.96 (*)    Hemoglobin 11.0 (*)    HCT 34.2 (*)    All other components within normal limits  LACTIC ACID, PLASMA - Abnormal; Notable for the following components:   Lactic Acid, Venous 2.0 (*)    All other components within normal limits  RESP PANEL BY RT-PCR (RSV, FLU A&B, COVID)  [id number]  CULTURE, BLOOD (ROUTINE X 2)  CULTURE, BLOOD (ROUTINE X 2)  PROTIME-INR  URINALYSIS, ROUTINE W REFLEX MICROSCOPIC  LACTIC ACID, PLASMA  CBG MONITORING, ED     EKG  On my independent interpretation of this EKG appears to be a sinus rhythm with rate of about 120, axis of about -15, intervals appear to be within normal limits, I do not appreciate any obvious ischemia on this EKG, although there is nonspecific T wave inversions noted in lead I and aVL, which seem to been present on the prior EKG   RADIOLOGY No acute cardiopulmonary findings appreciated on chest x-ray  PROCEDURES:  Critical Care performed: Yes, see critical care procedure note(s)  .Critical Care  Performed by: Fernand Rossie HERO, MD Authorized  by: Fernand Rossie HERO, MD   Critical care provider statement:    Critical care time (minutes):  30   Critical care was necessary to treat or prevent imminent or life-threatening deterioration of the following conditions:  Sepsis, shock and dehydration   Critical care was time spent personally by me on the following activities:  Development of treatment plan with patient or surrogate, discussions with consultants, evaluation of patient's response to treatment, examination of patient, ordering and review of laboratory studies, ordering and review of radiographic studies, ordering and performing treatments and interventions, pulse oximetry, re-evaluation of patient's condition and review of old charts    MEDICATIONS ORDERED IN ED: Medications  lactated ringers  infusion (has no administration in time range)  lactated ringers  bolus 1,000 mL (1,000 mLs Intravenous New Bag/Given 04/16/24 1408)    And  lactated ringers  bolus 1,000 mL (has no administration in time range)  metroNIDAZOLE  (FLAGYL ) IVPB 500 mg (500 mg Intravenous New Bag/Given 04/16/24 1423)  vancomycin  (VANCOCIN ) IVPB 1000 mg/200 mL premix (1,000 mg Intravenous New Bag/Given 04/16/24 1428)  ceFEPIme  (MAXIPIME ) 2 g in sodium chloride  0.9 % 100 mL IVPB (2 g Intravenous New Bag/Given 04/16/24 1421)  iohexol  (OMNIPAQUE ) 300 MG/ML solution 80 mL (80 mLs Intravenous Contrast Given 04/16/24 1449)     IMPRESSION / MDM / ASSESSMENT AND PLAN / ED COURSE  I reviewed the triage vital signs and the nursing notes.                               Patient's presentation is most consistent with acute presentation with potential threat to life or bodily function.  64 year old male with history of underlying lymphoma presents today with concern of weakness and fatigue.  Hypotensive and tachycardic upon arrival here, oral temperature of 98.6, complains of persistent weakness and fatigue that is been worsening over the last 3 to 4 days, I reviewed his recent  outpatient visit and his discharge summary from December 30, his urine culture was negative at that point.  No clear source of an infection here, but patient is concerning for underlying sepsis we will go ahead and initiate broad-spectrum antibiotics and obtain cultures and labs.  I am also obtaining CT imaging of the abdomen pelvis for further assessment given his complaints  of lower abdominal pain although objectively I do not find significant symptomatology on my exam.  Will follow-up labs and imaging and anticipate likely admission.       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   Fernand Rossie HERO, MD 04/16/24 [phone]  "

## 2024-04-16 NOTE — ED Triage Notes (Signed)
 C/O fatigue x 2-3 week. Patient had urethral surgery in December, but states hasn't felt well since.  BP has been running low since surgery. Has been taking meds tobring BP up.  Describes activity intolerance, fatigue. Requires frequent rest periods.  AAOx3. Skin warm and dry. NAD

## 2024-04-16 NOTE — ED Notes (Signed)
 Patient has a port to right chest, and wishes to wait to have port access to have blood drawn.

## 2024-04-16 NOTE — Sepsis Progress Note (Signed)
 Sepsis protocol is being followed by [facility].

## 2024-04-17 ENCOUNTER — Encounter: Payer: Self-pay | Admitting: Hospitalist

## 2024-04-17 ENCOUNTER — Other Ambulatory Visit: Payer: Self-pay

## 2024-04-17 DIAGNOSIS — A419 Sepsis, unspecified organism: Secondary | ICD-10-CM

## 2024-04-17 DIAGNOSIS — N390 Urinary tract infection, site not specified: Secondary | ICD-10-CM

## 2024-04-17 DIAGNOSIS — N412 Abscess of prostate: Secondary | ICD-10-CM

## 2024-04-17 LAB — URINE CULTURE: Culture: NO GROWTH

## 2024-04-17 MED ORDER — ENSURE PLUS HIGH PROTEIN PO LIQD
237.0000 mL | Freq: Two times a day (BID) | ORAL | Status: DC
  Administered 2024-04-18 – 2024-04-19 (×2): 237 mL via ORAL

## 2024-04-17 MED ORDER — SODIUM CHLORIDE 0.9 % IV SOLN
1.0000 g | INTRAVENOUS | Status: DC
  Administered 2024-04-17 – 2024-04-18 (×2): 1 g via INTRAVENOUS
  Filled 2024-04-17 (×3): qty 10

## 2024-04-17 MED ORDER — SODIUM CHLORIDE 0.9 % IV SOLN: INTRAVENOUS | Status: DC

## 2024-04-17 NOTE — Plan of Care (Signed)

## 2024-04-17 NOTE — Consult Note (Signed)
 "   Urology Consult  I have been asked to see the patient by Dr. Paudel, for evaluation and management of prostate abscess.  Chief Complaint: Fatigue, hypotension, dysuria  History of Present Illness: Christopher Mills is a 64 y.o. 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 Twylla on 03/12/2024 admitted yesterday with sepsis due to complicated UTI with pyelonephritis and prostate abscess.  CTAP with contrast on presentation notable for a 1 cm prostatic fluid collection and otherwise consistent with acute cystitis, bilateral ureteritis, pyelitis, and possible right pyelonephritis.  Admission labs notable for white count 2.8; lactate 2.0; creatinine 2.21 (baseline 1.1); and urine microscopy with >50 RBC/hpf, >50 WBCs/hpf, few bacteria, and WBC clumps.  Blood cultures pending with no growth at <24 hours; urine culture negative.  On antibiotics as below.  He saw Dr. Twylla for postop follow-up on 03/28/2024, at which point he reported dysuria, urethral burning, and intermittent gross hematuria.  He was started on empiric cefuroxime , but urine culture was ultimately negative.  He states his symptoms initially improved on antibiotics, however over the past week he has had progressive irritative voiding symptoms, hypotension, and fatigue.  He states that he is already feeling better than he was on arrival after receiving antibiotics yesterday afternoon.  Anti-infectives (From admission, onward)    Start     Dose/Rate Route Frequency Ordered Stop   04/17/24 1800  cefTRIAXone  (ROCEPHIN ) 1 g in sodium chloride  0.9 % 100 mL IVPB        1 g 200 mL/hr over 30 Minutes Intravenous Every 24 hours 04/17/24 1351 04/23/24 1759   04/16/24 1400  ceFEPIme  (MAXIPIME ) 2 g in sodium chloride   0.9 % 100 mL IVPB        2 g 200 mL/hr over 30 Minutes Intravenous  Once 04/16/24 1348 04/16/24 1451   04/16/24 1400  metroNIDAZOLE  (FLAGYL ) IVPB 500 mg        500 mg 100 mL/hr over 60 Minutes Intravenous  Once 04/16/24 1348 04/16/24 1519   04/16/24 1400  vancomycin  (VANCOCIN ) IVPB 1000 mg/200 mL premix        1,000 mg 200 mL/hr over 60 Minutes Intravenous  Once 04/16/24 1348 04/16/24 1528        Past Medical History:  Diagnosis Date   Acute kidney injury 07/12/2020   Anemia    DLBCL (diffuse large B cell lymphoma) (HCC)    Dyspnea    Enterocolitis 07/12/2020   Foley catheter in place    Gross hematuria    Hypertension    Hypotension 02/29/2024   admitted to [facility]   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   Port-A-Cath in place    Pyuria    Salmonella gastroenteritis    Sepsis secondary to UTI (HCC) 02/29/2024   Thrombocytopenia    Urethral obstruction     Past Surgical History:  Procedure Laterality Date   BONE MARROW BIOPSY     COLONOSCOPY     CYSTOSCOPY N/A 03/12/2024   Procedure: CYSTOSCOPY;  Surgeon: Twylla Glendia BROCKS, MD;  Location: [facility];  Service: Urology;  Laterality: N/A;   CYSTOSCOPY WITH BIOPSY N/A 03/12/2024   Procedure: CYSTOSCOPY, WITH BIOPSY;  Surgeon: Twylla Glendia BROCKS, MD;  Location: [facility];  Service: Urology;  Laterality: N/A;   CYSTOSCOPY WITH FULGERATION N/A 03/12/2024   Procedure:  CYSTOSCOPY, WITH BLADDER FULGURATION;  Surgeon: Twylla Glendia BROCKS, MD;  Location: [facility];  Service: Urology;  Laterality: N/A;   EXCISION MASS NECK Right 06/12/2020   Procedure: EXCISION MASS NECK;  Surgeon: [name], MD;  Location: [facility];  Service: General;  Laterality: Right;   HERNIA REPAIR Right    at age 23-[facility]H   PORTACATH PLACEMENT Right 06/24/2020   Procedure: INSERTION PORT-A-CATH;  Surgeon: [name], MD;  Location: [facility];  Service: General;  Laterality: Right;    Home Medications:  Active Medications[1]  Allergies:  Allergies[2]  Family History  Problem Relation Age of Onset   Anemia Mother    Hypertension Mother    Goiter Mother    Cancer Father    Cancer Sister    Multiple sclerosis Sister     Social History:  reports that he quit smoking about 3 years ago. His smoking use included cigarettes. He has never used smokeless tobacco. He reports that he does not currently use alcohol. He reports that he does not currently use drugs after having used the following drugs: Marijuana.  ROS: A complete review of systems was performed.  All systems are negative except for pertinent findings as noted.  Physical Exam:  Vital signs in last 24 hours: Temp:  [97.9 F (36.6 C)-98.6 F (37 C)] 98 F (36.7 C) (02/03 0826) Pulse Rate:  [77-134] 78 (02/03 1200) Resp:  [11-18] 11 (02/03 1200) BP: (88-124)/(67-89) 124/74 (02/03 1200) SpO2:  [99 %-100 %] 100 % (02/03 1200) Weight:  [65.8 kg] 65.8 kg (02/02 1312) Constitutional:  Alert and oriented, no acute distress HEENT: [place] AT, moist mucus membranes Cardiovascular: No clubbing, cyanosis, or edema Respiratory: Normal respiratory effort Skin: No rashes, bruises or suspicious lesions Neurologic: Grossly intact, no focal deficits, moving all 4 extremities Psychiatric: Normal mood and affect  Laboratory Data:  Recent Labs    04/16/24 1404 04/16/24 1631  WBC 2.8* 2.7*  HGB 11.0* 8.5*  HCT 34.2* 26.0*   Recent Labs    04/16/24 1404 04/16/24 1631  NA 133*  --   K 4.5  --   CL 95*  --   CO2 21*  --   GLUCOSE 102*  --   BUN 31*  --   CREATININE 2.21* 1.96*  CALCIUM 10.5*  --    Recent Labs    04/16/24 1404  INR 1.0   Urinalysis    Component Value Date/Time   COLORURINE ORANGE (A) 04/16/2024 1500   APPEARANCEUR CLOUDY (A) 04/16/2024 1500   APPEARANCEUR Slightly cloudy 03/28/2024 1331   LABSPEC 1.016 04/16/2024 1500   PHURINE 8.0 04/16/2024 1500   GLUCOSEU NEGATIVE 04/16/2024 1500   HGBUR LARGE (A) 04/16/2024 1500   BILIRUBINUR NEGATIVE  04/16/2024 1500   BILIRUBINUR Negative 03/28/2024 1331   KETONESUR NEGATIVE 04/16/2024 1500   PROTEINUR 100 (A) 04/16/2024 1500   UROBILINOGEN 0.2 01/17/2024 1342   NITRITE NEGATIVE 04/16/2024 1500   LEUKOCYTESUR LARGE (A) 04/16/2024 1500   Results for orders placed or performed during the hospital encounter of 04/16/24  Blood Culture (routine x 2)     Status: None (Preliminary result)   Collection Time: 04/16/24  2:04 PM   Specimen: BLOOD  Result Value Ref Range Status   Specimen Description BLOOD BLOOD RIGHT FOREARM  Final   Special Requests   Final    BOTTLES DRAWN AEROBIC AND ANAEROBIC Blood Culture results may not be optimal due to an inadequate volume of blood received in culture bottles  Culture   Final    NO GROWTH < 24 HOURS Performed at [facility], [address] [place], [place] [address]    Report Status PENDING  Incomplete  Blood Culture (routine x 2)     Status: None (Preliminary result)   Collection Time: 04/16/24  2:14 PM   Specimen: BLOOD  Result Value Ref Range Status   Specimen Description BLOOD BLOOD LEFT FOREARM  Final   Special Requests   Final    BOTTLES DRAWN AEROBIC AND ANAEROBIC Blood Culture adequate volume   Culture   Final    NO GROWTH < 24 HOURS Performed at [facility], [address] [place], [place] [address]    Report Status PENDING  Incomplete  Resp panel by RT-PCR (RSV, Flu A&B, Covid) Anterior Nasal Swab     Status: None   Collection Time: 04/16/24  2:16 PM   Specimen: Anterior Nasal Swab  Result Value Ref Range Status   SARS Coronavirus 2 by RT PCR NEGATIVE NEGATIVE Final    Comment: (NOTE) SARS-CoV-2 target nucleic acids are NOT DETECTED.  The SARS-CoV-2 RNA is generally detectable in upper respiratory specimens during the acute phase of infection. The lowest concentration of SARS-CoV-2 viral copies this assay can detect is 138 copies/mL. A negative result does not preclude SARS-Cov-2 infection and should  not be used as the sole basis for treatment or other patient management decisions. A negative result may occur with  improper specimen collection/handling, submission of specimen other than nasopharyngeal swab, presence of viral mutation(s) within the areas targeted by this assay, and inadequate number of viral copies(<138 copies/mL). A negative result must be combined with clinical observations, patient history, and epidemiological information. The expected result is Negative.  Fact Sheet for Patients:  bloggercourse.com  Fact Sheet for Healthcare Providers:  seriousbroker.it  This test is no t yet approved or cleared by the [place]  FDA and  has been authorized for detection and/or diagnosis of SARS-CoV-2 by FDA under an Emergency Use Authorization (EUA). This EUA will remain  in effect (meaning this test can be used) for the duration of the COVID-19 declaration under Section 564(b)(1) of the Act, 21 U.S.C.section 360bbb-3(b)(1), unless the authorization is terminated  or revoked sooner.       Influenza A by PCR NEGATIVE NEGATIVE Final   Influenza B by PCR NEGATIVE NEGATIVE Final    Comment: (NOTE) The Xpert Xpress SARS-CoV-2/FLU/RSV plus assay is intended as an aid in the diagnosis of influenza from Nasopharyngeal swab specimens and should not be used as a sole basis for treatment. Nasal washings and aspirates are unacceptable for Xpert Xpress SARS-CoV-2/FLU/RSV testing.  Fact Sheet for Patients: bloggercourse.com  Fact Sheet for Healthcare Providers: seriousbroker.it  This test is not yet approved or cleared by the [place]  FDA and has been authorized for detection and/or diagnosis of SARS-CoV-2 by FDA under an Emergency Use Authorization (EUA). This EUA will remain in effect (meaning this test can be used) for the duration of the COVID-19 declaration under  Section 564(b)(1) of the Act, 21 U.S.C. section 360bbb-3(b)(1), unless the authorization is terminated or revoked.     Resp Syncytial Virus by PCR NEGATIVE NEGATIVE Final    Comment: (NOTE) Fact Sheet for Patients: bloggercourse.com  Fact Sheet for Healthcare Providers: seriousbroker.it  This test is not yet approved or cleared by the [place]  FDA and has been authorized for detection and/or diagnosis of SARS-CoV-2 by FDA under an Emergency Use Authorization (EUA). This EUA will  remain in effect (meaning this test can be used) for the duration of the COVID-19 declaration under Section 564(b)(1) of the Act, 21 U.S.C. section 360bbb-3(b)(1), unless the authorization is terminated or revoked.  Performed at [facility], [address] [place], [place] [address]     Radiologic Imaging: CT ABDOMEN PELVIS W CONTRAST Result Date: 04/16/2024 EXAM: CT ABDOMEN AND PELVIS WITH CONTRAST 04/16/2024 02:58:57 PM TECHNIQUE: CT of the abdomen and pelvis was performed with the administration of 80 mL of iohexol  (OMNIPAQUE ) 300 MG/ML solution. Multiplanar reformatted images are provided for review. Automated exposure control, iterative reconstruction, and/or weight-based adjustment of the mA/kV was utilized to reduce the radiation dose to as low as reasonably achievable. COMPARISON: 01/02/2024. CLINICAL HISTORY: Sepsis, recent surgery, no clear source. FINDINGS: LOWER CHEST: No acute abnormality. LIVER: The liver is unremarkable. GALLBLADDER AND BILE DUCTS: Gallbladder is unremarkable. No biliary ductal dilatation. SPLEEN: No acute abnormality. PANCREAS: No acute abnormality. ADRENAL GLANDS: No acute abnormality. KIDNEYS, URETERS AND BLADDER: Subtle hypoattenuation of the upper pole of the right kidney. There is diffuse wall thickening and urothelial enhancement throughout the renal collecting systems bilaterally. Severe wall thickening and  mucosal enhancement of the urinary bladder. No stones in the kidneys or ureters. No hydronephrosis. No perinephric or periureteral stranding. GI AND BOWEL: The appendix was not visualized. No pericecal or right lower quadrant inflammatory changes to suggest acute appendicitis. Stomach demonstrates no acute abnormality. There is no bowel obstruction. PERITONEUM AND RETROPERITONEUM: No ascites. No free air. VASCULATURE: Diffuse aortoiliac atherosclerosis. Aorta is normal in caliber. LYMPH NODES: No lymphadenopathy. REPRODUCTIVE ORGANS: In the left prostate gland, there is a hypodense collection measuring 1 cm (axial 68). BONES AND SOFT TISSUES: Mild multilevel degenerative disc disease of the spine. No acute osseous abnormality. No focal soft tissue abnormality. IMPRESSION: 1. Findings consistent with acute cystitis, bilateral ureteritis and pyelitis, and possible right sided pyelonephritis. Correlation with urinalysis recommended. 2. Small fluid collection in the left side of the prostate gland measuring 1 cm, worrisome for prostatitis and possibly a prostatic abscess. 3. Small hypodense collection in the left prostate gland measuring 1 cm, suspicious for prostatic abscess. 4. Right renal upper pole hypoattenuation, suspicious for acute pyelonephritis. Electronically signed by: Rogelia Myers MD 04/16/2024 03:25 PM EST RP Workstation: [id number]   DG Chest Port 1 View Result Date: 04/16/2024 CLINICAL DATA:  Questionable sepsis. EXAM: PORTABLE CHEST 1 VIEW COMPARISON:  February 29, 2024 FINDINGS: There is stable right-sided venous Port-A-Cath positioning. The heart size and mediastinal contours are within normal limits. Both lungs are clear. Surgical clips are seen overlying the supraclavicular region on the right. Multilevel degenerative changes are present throughout the thoracic spine. IMPRESSION: No active cardiopulmonary disease. Electronically Signed   By: Suzen Dials M.D.   On: 04/16/2024 14:22    Assessment & Plan:  64 y.o. 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 Twylla on 03/12/2024 admitted yesterday with sepsis due to complicated UTI with pyelonephritis and prostate abscess.  He is clinically improving on empiric antibiotics despite a negative admission urine culture.  Prostatic fluid collection is too small to warrant percutaneous drainage or transurethral unroofing.  We recommend IV antibiotics and consideration of repeat imaging if he clinically declines despite continued antibiotic therapy.  He is in agreement with this plan.  Recommendations: - Continue antibiotics, supportive care -  Consider repeat CT if he clinically declines or develops worsening pelvic/perineal pain - Would recommend discharge on tissue penetrating antibiotics such as Bactrim , Cipro , or Levaquin  with plans for outpatient urology follow-up in about 2 weeks  Thank you for involving me in this patient's care, please page with any further questions or concerns.  Meia Emley, PA-C 04/17/2024 12:36 PM       [1]  Current Meds  Medication Sig   calcium citrate (CALCITRATE - DOSED IN MG ELEMENTAL CALCIUM) 950 (200 Ca) MG tablet Take 200 mg of elemental calcium by mouth daily.   cholecalciferol  (VITAMIN D3) 25 MCG (1000 UNIT) tablet Take 1,000 Units by mouth daily.   cyanocobalamin (VITAMIN B12) 1000 MCG tablet Take 1,000 mcg by mouth daily.   ferrous sulfate 325 (65 FE) MG EC tablet Take 325 mg by mouth 3 (three) times daily with meals.   lisinopril  (ZESTRIL ) 10 MG tablet Take 1 tablet (10 mg total) by mouth daily.   Omega-3 Fatty Acids (FISH OIL ADULT GUMMIES PO) Take 1 tablet by mouth daily at 6 (six) AM.   oxybutynin  (DITROPAN ) 5 MG tablet Take 1 tablet (5 mg total) by  mouth every 8 (eight) hours as needed for bladder spasms.   oxyCODONE -acetaminophen  (PERCOCET/ROXICET) 5-325 MG tablet Take 1 tablet by mouth every 4 (four) hours as needed for severe pain (pain score 7-10).   potassium chloride  (MICRO-K ) 10 MEQ CR capsule Take 10 mEq by mouth daily.   vitamin C  (ASCORBIC ACID ) 500 MG tablet Take 500 mg by mouth daily.   zinc sulfate, 50mg  elemental zinc, 220 (50 Zn) MG capsule Take 220 mg by mouth daily.  [2] No Known Allergies  "

## 2024-04-17 NOTE — ED Notes (Signed)
 Pt in bed eating meal tray, pt denies pain, pt states that he is ready to go upstairs, family at bedside, pt has no requests at this time

## 2024-04-18 ENCOUNTER — Other Ambulatory Visit: Payer: Self-pay | Admitting: Physician Assistant

## 2024-04-18 ENCOUNTER — Ambulatory Visit

## 2024-04-18 DIAGNOSIS — N412 Abscess of prostate: Secondary | ICD-10-CM

## 2024-04-18 LAB — BASIC METABOLIC PANEL WITH GFR
Anion gap: 13 (ref 5–15)
BUN: 18 mg/dL (ref 8–23)
CO2: 20 mmol/L — ABNORMAL LOW (ref 22–32)
Calcium: 9.2 mg/dL (ref 8.9–10.3)
Chloride: 105 mmol/L (ref 98–111)
Creatinine, Ser: 1.21 mg/dL (ref 0.61–1.24)
GFR, Estimated: >60 mL/min
Glucose, Bld: 86 mg/dL (ref 70–99)
Potassium: 4.3 mmol/L (ref 3.5–5.1)
Sodium: 138 mmol/L (ref 135–145)

## 2024-04-18 MED ORDER — ORAL CARE MOUTH RINSE: 15.0000 mL | OROMUCOSAL | Status: DC | PRN

## 2024-04-18 MED ORDER — OXYCODONE HCL 5 MG PO TABS: 5.0000 mg | ORAL_TABLET | ORAL | Status: DC | PRN

## 2024-04-18 MED ORDER — URIBEL 118 MG PO CAPS: 1.0000 | ORAL_CAPSULE | Freq: Four times a day (QID) | ORAL | 3 refills | Status: AC | PRN

## 2024-04-18 NOTE — Discharge Instructions (Signed)
 CT scan scheduling: [id number]

## 2024-04-18 NOTE — TOC CM/SW Note (Signed)
 Transition of Care Department [employer]) has reviewed patient and no TOC needs have been identified at this time.  If new patient transition needs arise, please place a TOC consult.  Patient has PCP, drives , no issues with picking up or paying for medications.

## 2024-04-18 NOTE — Progress Notes (Signed)
 Urology Inpatient Progress Note  Subjective: No acute events overnight. He is afebrile, VSS. Creatinine down, 1.21.  Blood cultures pending with no growth at 2 days.  On antibiotics as below. He describes pain after voiding and recurrent gross hematuria that started this morning, though the hematuria has improved significantly as of this afternoon.  He is voiding frequently and worried that he may not be emptying fully.  He notes that Pyridium  has not been helpful with his dysuria.  Anti-infectives: Anti-infectives (From admission, onward)    Start     Dose/Rate Route Frequency Ordered Stop   04/17/24 1800  cefTRIAXone  (ROCEPHIN ) 1 g in sodium chloride  0.9 % 100 mL IVPB        1 g 200 mL/hr over 30 Minutes Intravenous Every 24 hours 04/17/24 1351 04/23/24 1759   04/16/24 1400  ceFEPIme  (MAXIPIME ) 2 g in sodium chloride  0.9 % 100 mL IVPB        2 g 200 mL/hr over 30 Minutes Intravenous  Once 04/16/24 1348 04/16/24 1451   04/16/24 1400  metroNIDAZOLE  (FLAGYL ) IVPB 500 mg        500 mg 100 mL/hr over 60 Minutes Intravenous  Once 04/16/24 1348 04/16/24 1519   04/16/24 1400  vancomycin  (VANCOCIN ) IVPB 1000 mg/200 mL premix        1,000 mg 200 mL/hr over 60 Minutes Intravenous  Once 04/16/24 1348 04/16/24 1528       Current Facility-Administered Medications  Medication Dose Route Frequency Provider Last Rate Last Admin   acetaminophen  (TYLENOL ) tablet 650 mg  650 mg Oral Q6H PRN Paudel, Keshab, MD       Or   acetaminophen  (TYLENOL ) suppository 650 mg  650 mg Rectal Q6H PRN Paudel, Nena, MD       cefTRIAXone  (ROCEPHIN ) 1 g in sodium chloride  0.9 % 100 mL IVPB  1 g Intravenous Q24H Kadali, Renuka A, MD   Stopped at 04/17/24 1825   enoxaparin  (LOVENOX ) injection 40 mg  40 mg Subcutaneous Q24H Paudel, Nena, MD   40 mg at 04/17/24 2207   feeding supplement (ENSURE PLUS HIGH PROTEIN) liquid 237 mL  237 mL Oral BID BM Kadali, Renuka A, MD   237 mL at 04/18/24 0858   ondansetron  (ZOFRAN )  tablet 4 mg  4 mg Oral Q6H PRN Roann Nena, MD       Or   ondansetron  (ZOFRAN ) injection 4 mg  4 mg Intravenous Q6H PRN Paudel, Keshab, MD       Oral care mouth rinse  15 mL Mouth Rinse PRN Von Bellis, MD       oxyCODONE  (Oxy IR/ROXICODONE ) immediate release tablet 5 mg  5 mg Oral Q4H PRN Alexander, Natalie, DO       polyethylene glycol (MIRALAX  / GLYCOLAX ) packet 17 g  17 g Oral Daily PRN Paudel, Keshab, MD       Facility-Administered Medications Ordered in Other Encounters  Medication Dose Route Frequency Provider Last Rate Last Admin   0.9 %  sodium chloride  infusion   Intravenous Continuous Brahmanday, Govinda R, MD   Stopped at 09/11/20 1502   heparin  lock flush 100 unit/mL  500 Units Intravenous Once Rao, Archana C, MD       heparin  lock flush 100 unit/mL  500 Units Intravenous Once Rao, Archana C, MD       methotrexate  (PF) 12 mg in sodium chloride  (PF) 0.9 % INTRATHECAL chemo injection   Intrathecal Once Rao, Archana C, MD  sodium chloride  flush (NS) 0.9 % injection 10 mL  10 mL Intracatheter PRN [name], MD   10 mL at 01/18/23 1545   sodium chloride  flush (NS) 0.9 % injection 10 mL  10 mL Intravenous Once [name], MD       Objective: Vital signs in last 24 hours: Temp:  [97.5 F (36.4 C)-98.9 F (37.2 C)] 98 F (36.7 C) (02/04 0939) Pulse Rate:  [82-98] 82 (02/04 0939) Resp:  [16-18] 17 (02/04 0939) BP: (109-125)/(61-84) 120/74 (02/04 0939) SpO2:  [100 %] 100 % (02/04 0939) Weight:  [63.2 kg] 63.2 kg (02/03 1650)  Intake/Output from previous day: 02/03 0701 - 02/04 0700 In: 1986.9 [P.O.:535; I.V.:1351.9; IV Piggyback:100] Out: 600 [Urine:600] Intake/Output this shift: No intake/output data recorded.  Physical Exam Vitals and nursing note reviewed.  Constitutional:      General: He is not in acute distress.    Appearance: He is not ill-appearing, toxic-appearing or diaphoretic.  HENT:     Head: Normocephalic and atraumatic.  Pulmonary:      Effort: Pulmonary effort is normal. No respiratory distress.  Skin:    General: Skin is warm and dry.  Neurological:     Mental Status: He is alert and oriented to person, place, and time.  Psychiatric:        Mood and Affect: Mood normal.        Behavior: Behavior normal.    Lab Results:  Recent Labs    04/16/24 1404 04/16/24 1631  WBC 2.8* 2.7*  HGB 11.0* 8.5*  HCT 34.2* 26.0*  PLT 281 187   BMET Recent Labs    04/16/24 1404 04/16/24 1631 04/18/24 0414  NA 133*  --  138  K 4.5  --  4.3  CL 95*  --  105  CO2 21*  --  20*  GLUCOSE 102*  --  86  BUN 31*  --  18  CREATININE 2.21* 1.96* 1.21  CALCIUM 10.5*  --  9.2   PT/INR Recent Labs    04/16/24 1404  LABPROT 13.9  INR 1.0   ABG No results for input(s): PHART, HCO3 in the last 72 hours.  Invalid input(s): PCO2, PO2  Studies/Results: CT ABDOMEN PELVIS W CONTRAST Result Date: 04/16/2024 EXAM: CT ABDOMEN AND PELVIS WITH CONTRAST 04/16/2024 02:58:57 PM TECHNIQUE: CT of the abdomen and pelvis was performed with the administration of 80 mL of iohexol  (OMNIPAQUE ) 300 MG/ML solution. Multiplanar reformatted images are provided for review. Automated exposure control, iterative reconstruction, and/or weight-based adjustment of the mA/kV was utilized to reduce the radiation dose to as low as reasonably achievable. COMPARISON: 01/02/2024. CLINICAL HISTORY: Sepsis, recent surgery, no clear source. FINDINGS: LOWER CHEST: No acute abnormality. LIVER: The liver is unremarkable. GALLBLADDER AND BILE DUCTS: Gallbladder is unremarkable. No biliary ductal dilatation. SPLEEN: No acute abnormality. PANCREAS: No acute abnormality. ADRENAL GLANDS: No acute abnormality. KIDNEYS, URETERS AND BLADDER: Subtle hypoattenuation of the upper pole of the right kidney. There is diffuse wall thickening and urothelial enhancement throughout the renal collecting systems bilaterally. Severe wall thickening and mucosal enhancement of the urinary  bladder. No stones in the kidneys or ureters. No hydronephrosis. No perinephric or periureteral stranding. GI AND BOWEL: The appendix was not visualized. No pericecal or right lower quadrant inflammatory changes to suggest acute appendicitis. Stomach demonstrates no acute abnormality. There is no bowel obstruction. PERITONEUM AND RETROPERITONEUM: No ascites. No free air. VASCULATURE: Diffuse aortoiliac atherosclerosis. Aorta is normal in caliber. LYMPH NODES: No lymphadenopathy. REPRODUCTIVE  ORGANS: In the left prostate gland, there is a hypodense collection measuring 1 cm (axial 68). BONES AND SOFT TISSUES: Mild multilevel degenerative disc disease of the spine. No acute osseous abnormality. No focal soft tissue abnormality. IMPRESSION: 1. Findings consistent with acute cystitis, bilateral ureteritis and pyelitis, and possible right sided pyelonephritis. Correlation with urinalysis recommended. 2. Small fluid collection in the left side of the prostate gland measuring 1 cm, worrisome for prostatitis and possibly a prostatic abscess. 3. Small hypodense collection in the left prostate gland measuring 1 cm, suspicious for prostatic abscess. 4. Right renal upper pole hypoattenuation, suspicious for acute pyelonephritis. Electronically signed by: Rogelia Myers MD 04/16/2024 03:25 PM EST RP Workstation: [id number]   DG Chest Port 1 View Result Date: 04/16/2024 CLINICAL DATA:  Questionable sepsis. EXAM: PORTABLE CHEST 1 VIEW COMPARISON:  February 29, 2024 FINDINGS: There is stable right-sided venous Port-A-Cath positioning. The heart size and mediastinal contours are within normal limits. Both lungs are clear. Surgical clips are seen overlying the supraclavicular region on the right. Multilevel degenerative changes are present throughout the thoracic spine. IMPRESSION: No active cardiopulmonary disease. Electronically Signed   By: Suzen Dials M.D.   On: 04/16/2024 14:22   Assessment & Plan: 64 y.o. 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 Twylla on 03/12/2024, now admitted with sepsis due to complicated UTI with pyelonephritis and a small prostate abscess.  We discussed that I suspect his pain after voiding and gross hematuria are related to his hemorrhagic cystitis and may not be a symptom of his infection.  I recommended outpatient follow-up with us  in 2 weeks with repeat CT pelvis with contrast prior.  If he still having gross hematuria at that time, we will plan to refer him for hyperbaric oxygen treatments.  He is in agreement with this plan.   Recommendations: -Post-void bladder scan to ensure appropriate emptying -Continue empiric IV antibiotics with plans for discharge on tissue penetrating PO antibiotics (Bactrim , Cipro , Levaquin  preferred) -Outpatient urology follow up in 2 weeks with CT pelvis with contrast prior - Stop Pyridium  due to inefficacy; outpatient Uribel  sent to his preferred pharmacy for symptom relief at home  [facility], PA-C 04/18/2024

## 2024-04-18 NOTE — Plan of Care (Signed)

## 2024-04-18 NOTE — Progress Notes (Signed)
 Patient states that he is having more burning and pain with urination and his urine is more bloody.    MD notified.

## 2024-04-18 NOTE — Progress Notes (Signed)
 " PROGRESS NOTE    Christopher Mills   FMW:[id number] DOB: 06/16/1960  DOA: 04/16/2024 Date of Service: 04/18/24 which is hospital day 2  PCP: Pcp, No    Christopher Mills is a pleasant 64 y.o. 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 Twylla on 03/12/2024), HTN, B-cell lymphoma treated with cyclophosphamide , history of recurrent urinary tract infection who came to ED feeling tired all the time.   HPI: Patient stated that since she had a cystoscopy he had multiple problems including but not limited to burning and difficulty urination, generalized weakness and lightheadedness. When he was checking blood pressure at home, reports heart rate was high and BP was low. Has not been able to receive any chemotherapy in the month of January. He saw Dr. Twylla for postop follow-up on 03/28/2024, at which point he reported dysuria, urethral burning, and intermittent gross hematuria. He was started on empiric cefuroxime , but urine culture was ultimately negative. He states his symptoms initially improved on antibiotics, however over the past week he has had progressive irritative voiding symptoms, hypotension, and fatigue.  Hospital course / significant events: 02/02: to ED. BP 88/37, HR 134, WBC 2.8, BUN 31, Cr 2.21, UA concern for UTI, CT abd/pelv (+)cystitis, pyelonephritis, possible prostate abscess. Urology consult - advise continue abx. Admitted to hospitalist  02/03: clinically improving. Per urology, prostatic fluid collection is too small to warrant percutaneous drainage or transurethral unroofing. We recommend IV antibiotics and consideration of repeat imaging if he clinically declines --> recommend discharge on tissue penetrating antibiotics such as Bactrim , Cipro ,  or Levaquin  with plans for outpatient urology follow-up in about 2 weeks  02/04: UCx no growth, BCx remain neg also. Complete 72h IV abx today and anticipate dc home tomorrow on po abx      Consultants:  Urology  Procedures/Surgeries: none      ASSESSMENT & PLAN:   Questionable UTI - culture negative Questionable Pyelonephritis - culture negative but inflammatory findings on imaging  Prostate abscess Sepsis due to above Urine culture 04/16/24 no growth Blood cultures 04/16/24 no growth x2d IV ceftriaxone  (have d/c cefepime , vanc, metronidazole ) --> consdier for DC on Bactrim , Cipro , or Levaquin   Urology consult --> no surgery Outpatient urology follow-up in about 2 weeks  AKI: resolved improving from 2.21 --> 1.96 --> 1.21 today w/ GFR >60 Suspect dehydration as cause  Baseline Cr 1.11 IV fluids can DC monitor BMP Avoid nephrotoxic drugs   Anemia Likely hemodilutional w/ significant IV fluid resuscitation  Monitor CBC Hematuria no passing clots or gross blood   High-grade lymphoma Follow-up as outpatient    Hypotension/tachycardia d/t sepsis: resolved DC home antihypertensives     Underweight based on BMI: Body mass index is 18.38 kg/m.SABRA Significantly low or high BMI is associated with higher medical risk.  Underweight - under 18  overweight - 25 to 29 obese - 30 or more Class 1 obesity: BMI of 30.0 to 34 Class 2 obesity: BMI of 35.0 to 39 Class 3 obesity: BMI of 40.0 to 49 Super Morbid Obesity: BMI 50-59 Super-super Morbid Obesity: BMI 60+ Healthy nutrition and physical activity advised as adjunct to other disease management and risk reduction treatments    DVT prophylaxis: SCD IV fluids: no continuous IV fluids  Nutrition: regular Central lines / other devices: none  Code Status:  FULL CODE ACP documentation reviewed:  none on file in [facility]  TOC needs: none Medical barriers to dispo at this time: one more night IV abx then anticipate DC home  tomorrow.              Subjective / Brief ROS:  Patient reports hematuria and dysuria are persisting today and he is very concerned about these symptoms, does not feel safe to go home, has questions about urology follow up  Denies CP/SOB.  Pain controlled.  Denies new weakness.  Tolerating diet.  Reports no concerns w/ urination/defecation.   Family Communication: family at bedside on rounds    Objective Findings:  Vitals:   04/17/24 2030 04/18/24 0026 04/18/24 0530 04/18/24 0939  BP: 118/69 112/70 109/61 120/74  Pulse: 85 98 87 82  Resp: 18 18 18 17   Temp: 98.6 F (37 C) 98.9 F (37.2 C) 98.7 F (37.1 C) 98 F (36.7 C)  TempSrc: Oral Oral Oral   SpO2: 100% 100% 100% 100%  Weight:      Height:        Intake/Output Summary (Last 24 hours) at 04/18/2024 1547 Last data filed at 04/18/2024 1300 Gross per 24 hour  Intake 1986.9 ml  Output 600 ml  Net 1386.9 ml   Filed Weights   04/16/24 1312 04/17/24 1650  Weight: 65.8 kg 63.2 kg    Examination:  Physical Exam Constitutional:      General: He is not in acute distress. Cardiovascular:     Rate and Rhythm: Normal rate and regular rhythm.  Pulmonary:     Effort: Pulmonary effort is normal.     Breath sounds: Normal breath sounds.  Abdominal:     Palpations: Abdomen is soft.  Musculoskeletal:     Right lower leg: No edema.     Left lower leg: No edema.  Neurological:     Mental Status: He is alert and oriented to person, place, and time. Mental status is at baseline.  Psychiatric:        Mood and Affect: Mood normal.        Behavior: Behavior normal.          Scheduled Medications:   enoxaparin  (LOVENOX ) injection  40 mg Subcutaneous Q24H   feeding supplement  237 mL Oral BID BM    Continuous Infusions:  cefTRIAXone  (ROCEPHIN )  IV Stopped (04/17/24 1825)    PRN Medications:  acetaminophen  **OR** acetaminophen , ondansetron  **OR** ondansetron  (ZOFRAN ) IV, mouth rinse, oxyCODONE ,  polyethylene glycol  Antimicrobials from admission:  Anti-infectives (From admission, onward)    Start     Dose/Rate Route Frequency Ordered Stop   04/18/24 0000  Meth-Hyo-M Bl-Na Phos-Ph Sal (URIBEL ) 118 MG CAPS        1 capsule Oral 4 times daily PRN 04/18/24 1541     04/17/24 1800  cefTRIAXone  (ROCEPHIN ) 1 g in sodium chloride  0.9 % 100 mL IVPB        1 g 200 mL/hr over 30 Minutes Intravenous Every 24 hours 04/17/24 1351 04/23/24 1759   04/16/24 1400  ceFEPIme  (MAXIPIME ) 2 g in sodium chloride  0.9 % 100 mL IVPB        2 g 200 mL/hr over 30 Minutes Intravenous  Once 04/16/24 1348 04/16/24 1451   04/16/24 1400  metroNIDAZOLE  (FLAGYL ) IVPB 500 mg        500 mg 100 mL/hr over 60 Minutes Intravenous  Once 04/16/24 1348 04/16/24 1519   04/16/24 1400  vancomycin  (VANCOCIN ) IVPB 1000 mg/200 mL  premix        1,000 mg 200 mL/hr over 60 Minutes Intravenous  Once 04/16/24 1348 04/16/24 1528           Data Reviewed:  I have personally reviewed the following...  CBC: Recent Labs  Lab 04/16/24 1404 04/16/24 1631  WBC 2.8* 2.7*  HGB 11.0* 8.5*  HCT 34.2* 26.0*  MCV 86.4 85.5  PLT 281 187   Basic Metabolic Panel: Recent Labs  Lab 04/16/24 1404 04/16/24 1631 04/18/24 0414  NA 133*  --  138  K 4.5  --  4.3  CL 95*  --  105  CO2 21*  --  20*  GLUCOSE 102*  --  86  BUN 31*  --  18  CREATININE 2.21* 1.96* 1.21  CALCIUM 10.5*  --  9.2   GFR: Estimated Creatinine Clearance: 55.1 mL/min (by C-G formula based on SCr of 1.21 mg/dL). Liver Function Tests: Recent Labs  Lab 04/16/24 1404  AST 72*  ALT 42  ALKPHOS 151*  BILITOT 0.6  PROT 7.6  ALBUMIN 3.8   No results for input(s): LIPASE, AMYLASE in the last 168 hours. No results for input(s): AMMONIA in the last 168 hours. Coagulation Profile: Recent Labs  Lab 04/16/24 1404  INR 1.0   Cardiac Enzymes: No results for input(s): CKTOTAL, CKMB, CKMBINDEX, TROPONINI in the last 168 hours. BNP (last 3  results) No results for input(s): PROBNP in the last 8760 hours. HbA1C: No results for input(s): HGBA1C in the last 72 hours. CBG: No results for input(s): GLUCAP in the last 168 hours. Lipid Profile: No results for input(s): CHOL, HDL, LDLCALC, TRIG, CHOLHDL, LDLDIRECT in the last 72 hours. Thyroid  Function Tests: No results for input(s): TSH, T4TOTAL, FREET4, T3FREE, THYROIDAB in the last 72 hours. Anemia Panel: No results for input(s): VITAMINB12, FOLATE, FERRITIN, TIBC, IRON, RETICCTPCT in the last 72 hours. Most Recent Urinalysis On File:     Component Value Date/Time   COLORURINE ORANGE (A) 04/16/2024 1500   APPEARANCEUR CLOUDY (A) 04/16/2024 1500   APPEARANCEUR Slightly cloudy 03/28/2024 1331   LABSPEC 1.016 04/16/2024 1500   PHURINE 8.0 04/16/2024 1500   GLUCOSEU NEGATIVE 04/16/2024 1500   HGBUR LARGE (A) 04/16/2024 1500   BILIRUBINUR NEGATIVE 04/16/2024 1500   BILIRUBINUR Negative 03/28/2024 1331   KETONESUR NEGATIVE 04/16/2024 1500   PROTEINUR 100 (A) 04/16/2024 1500   UROBILINOGEN 0.2 01/17/2024 1342   NITRITE NEGATIVE 04/16/2024 1500   LEUKOCYTESUR LARGE (A) 04/16/2024 1500   Sepsis Labs: @LABRCNTIP (procalcitonin:4,lacticidven:4) Microbiology: Recent Results (from the past 240 hours)  Urine culture     Status: None   Collection Time: 04/10/24  3:52 PM   Specimen: Urine, Clean Catch  Result Value Ref Range Status   Specimen Description   Final    URINE, CLEAN CATCH Performed at [facility], [address] [place], [place] [address]    Special Requests   Final    NONE Performed at [facility], [address], [place], [place] [address]    Culture   Final    NO GROWTH Performed at [facility], [address] [place], [place] [address]    Report Status 04/11/2024 FINAL  Final  Blood Culture (routine x 2)     Status: None (Preliminary result)   Collection Time: 04/16/24  2:04 PM   Specimen:  BLOOD  Result Value Ref Range Status   Specimen Description BLOOD BLOOD RIGHT FOREARM  Final   Special Requests   Final  BOTTLES DRAWN AEROBIC AND ANAEROBIC Blood Culture results may not be optimal due to an inadequate volume of blood received in culture bottles   Culture   Final    NO GROWTH 2 DAYS Performed at [facility], [address] [place], [place] [address]    Report Status PENDING  Incomplete  Blood Culture (routine x 2)     Status: None (Preliminary result)   Collection Time: 04/16/24  2:14 PM   Specimen: BLOOD  Result Value Ref Range Status   Specimen Description BLOOD BLOOD LEFT FOREARM  Final   Special Requests   Final    BOTTLES DRAWN AEROBIC AND ANAEROBIC Blood Culture adequate volume   Culture   Final    NO GROWTH 2 DAYS Performed at [facility], [address] [place], [place] [address]    Report Status PENDING  Incomplete  Resp panel by RT-PCR (RSV, Flu A&B, Covid) Anterior Nasal Swab     Status: None   Collection Time: 04/16/24  2:16 PM   Specimen: Anterior Nasal Swab  Result Value Ref Range Status   SARS Coronavirus 2 by RT PCR NEGATIVE NEGATIVE Final    Comment: (NOTE) SARS-CoV-2 target nucleic acids are NOT DETECTED.  The SARS-CoV-2 RNA is generally detectable in upper respiratory specimens during the acute phase of infection. The lowest concentration of SARS-CoV-2 viral copies this assay can detect is 138 copies/mL. A negative result does not preclude SARS-Cov-2 infection and should not be used as the sole basis for treatment or other patient management decisions. A negative result may occur with  improper specimen collection/handling, submission of specimen other than nasopharyngeal swab, presence of viral mutation(s) within the areas targeted by this assay, and inadequate number of viral copies(<138 copies/mL). A negative result must be combined with clinical observations, patient history, and epidemiological information.  The expected result is Negative.  Fact Sheet for Patients:  bloggercourse.com  Fact Sheet for Healthcare Providers:  seriousbroker.it  This test is no t yet approved or cleared by the [place]  FDA and  has been authorized for detection and/or diagnosis of SARS-CoV-2 by FDA under an Emergency Use Authorization (EUA). This EUA will remain  in effect (meaning this test can be used) for the duration of the COVID-19 declaration under Section 564(b)(1) of the Act, 21 U.S.C.section 360bbb-3(b)(1), unless the authorization is terminated  or revoked sooner.       Influenza A by PCR NEGATIVE NEGATIVE Final   Influenza B by PCR NEGATIVE NEGATIVE Final    Comment: (NOTE) The Xpert Xpress SARS-CoV-2/FLU/RSV plus assay is intended as an aid in the diagnosis of influenza from Nasopharyngeal swab specimens and should not be used as a sole basis for treatment. Nasal washings and aspirates are unacceptable for Xpert Xpress SARS-CoV-2/FLU/RSV testing.  Fact Sheet for Patients: bloggercourse.com  Fact Sheet for Healthcare Providers: seriousbroker.it  This test is not yet approved or cleared by the [place]  FDA and has been authorized for detection and/or diagnosis of SARS-CoV-2 by FDA under an Emergency Use Authorization (EUA). This EUA will remain in effect (meaning this test can be used) for the duration of the COVID-19 declaration under Section 564(b)(1) of the Act, 21 U.S.C. section 360bbb-3(b)(1), unless the authorization is terminated or revoked.     Resp Syncytial Virus by PCR NEGATIVE NEGATIVE Final    Comment: (NOTE) Fact Sheet for Patients: bloggercourse.com  Fact Sheet for Healthcare Providers: seriousbroker.it  This test is not yet approved or cleared by the [place]   FDA and has been authorized for detection and/or  diagnosis of SARS-CoV-2 by FDA under an Emergency Use Authorization (EUA). This EUA will remain in effect (meaning this test can be used) for the duration of the COVID-19 declaration under Section 564(b)(1) of the Act, 21 U.S.C. section 360bbb-3(b)(1), unless the authorization is terminated or revoked.  Performed at [facility], [address], [place], [place] [address]   Urine Culture     Status: None   Collection Time: 04/16/24  3:45 PM   Specimen: Urine, Clean Catch  Result Value Ref Range Status   Specimen Description   Final    URINE, CLEAN CATCH Performed at [facility], [address] [place], [place] [address]    Special Requests   Final    NONE Performed at [facility], [address], [place], [place] [address]    Culture   Final    NO GROWTH Performed at [facility], [address] [place], [place] [address]    Report Status 04/17/2024 FINAL  Final      Radiology Studies last 3 days: CT ABDOMEN PELVIS W CONTRAST Result Date: 04/16/2024 EXAM: CT ABDOMEN AND PELVIS WITH CONTRAST 04/16/2024 02:58:57 PM TECHNIQUE: CT of the abdomen and pelvis was performed with the administration of 80 mL of iohexol  (OMNIPAQUE ) 300 MG/ML solution. Multiplanar reformatted images are provided for review. Automated exposure control, iterative reconstruction, and/or weight-based adjustment of the mA/kV was utilized to reduce the radiation dose to as low as reasonably achievable. COMPARISON: 01/02/2024. CLINICAL HISTORY: Sepsis, recent surgery, no clear source. FINDINGS: LOWER CHEST: No acute abnormality. LIVER: The liver is unremarkable. GALLBLADDER AND BILE DUCTS: Gallbladder is unremarkable. No biliary ductal dilatation. SPLEEN: No acute abnormality. PANCREAS: No acute abnormality. ADRENAL GLANDS: No acute abnormality. KIDNEYS, URETERS AND BLADDER: Subtle hypoattenuation of the upper pole of the right kidney. There is diffuse wall thickening and  urothelial enhancement throughout the renal collecting systems bilaterally. Severe wall thickening and mucosal enhancement of the urinary bladder. No stones in the kidneys or ureters. No hydronephrosis. No perinephric or periureteral stranding. GI AND BOWEL: The appendix was not visualized. No pericecal or right lower quadrant inflammatory changes to suggest acute appendicitis. Stomach demonstrates no acute abnormality. There is no bowel obstruction. PERITONEUM AND RETROPERITONEUM: No ascites. No free air. VASCULATURE: Diffuse aortoiliac atherosclerosis. Aorta is normal in caliber. LYMPH NODES: No lymphadenopathy. REPRODUCTIVE ORGANS: In the left prostate gland, there is a hypodense collection measuring 1 cm (axial 68). BONES AND SOFT TISSUES: Mild multilevel degenerative disc disease of the spine. No acute osseous abnormality. No focal soft tissue abnormality. IMPRESSION: 1. Findings consistent with acute cystitis, bilateral ureteritis and pyelitis, and possible right sided pyelonephritis. Correlation with urinalysis recommended. 2. Small fluid collection in the left side of the prostate gland measuring 1 cm, worrisome for prostatitis and possibly a prostatic abscess. 3. Small hypodense collection in the left prostate gland measuring 1 cm, suspicious for prostatic abscess. 4. Right renal upper pole hypoattenuation, suspicious for acute pyelonephritis. Electronically signed by: Rogelia Myers MD 04/16/2024 03:25 PM EST RP Workstation: [id number]   DG Chest Port 1 View Result Date: 04/16/2024 CLINICAL DATA:  Questionable sepsis. EXAM: PORTABLE CHEST 1 VIEW COMPARISON:  February 29, 2024 FINDINGS: There is stable right-sided venous Port-A-Cath positioning. The heart size and mediastinal contours are within normal limits. Both lungs are clear. Surgical clips are seen overlying the supraclavicular region on the right. Multilevel degenerative changes are present throughout the thoracic spine. IMPRESSION: No  active  cardiopulmonary disease. Electronically Signed   By: Suzen Dials M.D.   On: 04/16/2024 14:22         Laportia Carley, DO [facility] 04/18/2024, 3:47 PM    Dictation software may have been used to generate the above note. Typos may occur and escape review in typed/dictated notes. Please contact Dr Marsa directly for clarity if needed.  Staff may message me via secure chat in Epic  but this may not receive an immediate response,  please page me for urgent matters!  If 7PM-7AM, please contact night coverage [email or web address]       "

## 2024-04-18 NOTE — Hospital Course (Addendum)
 Christopher Mills is a pleasant 64 y.o. 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 Twylla on 03/12/2024), HTN, B-cell lymphoma treated with cyclophosphamide , history of recurrent urinary tract infection who came to ED feeling tired all the time.   HPI: Patient stated that since she had a cystoscopy he had multiple problems including but not limited to burning and difficulty urination, generalized weakness and lightheadedness. When he was checking blood pressure at home, reports heart rate was high and BP was low. Has not been able to receive any chemotherapy in the month of January. He saw Dr. Twylla for postop follow-up on 03/28/2024, at which point he reported dysuria, urethral burning, and intermittent gross hematuria. He was started on empiric cefuroxime , but urine culture was ultimately negative. He states his symptoms initially improved on antibiotics, however over the past week he has had progressive irritative voiding symptoms, hypotension, and fatigue.  Hospital course / significant events: 02/02: to ED. BP 88/37, HR 134, WBC 2.8, BUN 31, Cr 2.21, UA concern for UTI, CT abd/pelv (+)cystitis, pyelonephritis, possible prostate abscess. Urology consult - advise continue abx. Admitted to hospitalist  02/03: clinically improving. Per urology, prostatic fluid collection is too small to warrant percutaneous drainage or transurethral unroofing. We recommend IV antibiotics and consideration of repeat imaging if he clinically declines --> recommend discharge on tissue penetrating antibiotics such as Bactrim , Cipro , or Levaquin  with plans for outpatient urology follow-up in about 2 weeks  02/04: UCx no growth, BCx remain neg also. Complete 72h IV abx today and  anticipate dc home tomorrow on po abx      Consultants:  Urology  Procedures/Surgeries: none      ASSESSMENT & PLAN:   Questionable UTI - culture negative Questionable Pyelonephritis - culture negative but inflammatory findings on imaging  Prostate abscess Sepsis due to above Urine culture 04/16/24 no growth Blood cultures 04/16/24 no growth x2d IV ceftriaxone  (have d/c cefepime , vanc, metronidazole ) --> consdier for DC on Bactrim , Cipro , or Levaquin   Urology consult --> no surgery Outpatient urology follow-up in about 2 weeks  AKI: resolved improving from 2.21 --> 1.96 --> 1.21 today w/ GFR >60 Suspect dehydration as cause  Baseline Cr 1.11 IV fluids can DC monitor BMP Avoid nephrotoxic drugs   Anemia Likely hemodilutional w/ significant IV fluid resuscitation  Monitor CBC Hematuria no passing clots or gross blood   High-grade lymphoma Follow-up as outpatient    Hypotension/tachycardia d/t sepsis: resolved DC home antihypertensives     Underweight based on BMI: Body mass index is 18.38 kg/m.SABRA Significantly low or high BMI is associated with higher medical risk.  Underweight - under 18  overweight - 25 to 29 obese - 30 or more Class 1 obesity: BMI of 30.0 to 34 Class 2 obesity: BMI of 35.0 to 39 Class 3 obesity: BMI of 40.0 to 49 Super Morbid Obesity: BMI 50-59 Super-super Morbid Obesity: BMI 60+ Healthy nutrition and physical activity advised as adjunct to other disease management and risk reduction treatments    DVT prophylaxis: SCD IV fluids: no continuous IV fluids  Nutrition: regular Central lines / other devices: none  Code Status: FULL CODE ACP documentation reviewed:  none on file in [facility]  TOC needs: none Medical barriers to dispo at this time: one more night IV abx then anticipate DC home tomorrow.

## 2024-04-19 LAB — CBC
HCT: 28 % — ABNORMAL LOW (ref 39.0–52.0)
Hemoglobin: 8.8 g/dL — ABNORMAL LOW (ref 13.0–17.0)
MCH: 27.5 pg (ref 26.0–34.0)
MCHC: 31.4 g/dL (ref 30.0–36.0)
MCV: 87.5 fL (ref 80.0–100.0)
Platelets: 200 10*3/uL (ref 150–400)
RBC: 3.2 MIL/uL — ABNORMAL LOW (ref 4.22–5.81)
RDW: 14.3 % (ref 11.5–15.5)
WBC: 2.3 10*3/uL — ABNORMAL LOW (ref 4.0–10.5)
nRBC: 0 % (ref 0.0–0.2)

## 2024-04-19 LAB — BASIC METABOLIC PANEL WITH GFR
Anion gap: 10 (ref 5–15)
BUN: 15 mg/dL (ref 8–23)
CO2: 23 mmol/L (ref 22–32)
Calcium: 9.5 mg/dL (ref 8.9–10.3)
Chloride: 104 mmol/L (ref 98–111)
Creatinine, Ser: 1.25 mg/dL — ABNORMAL HIGH (ref 0.61–1.24)
GFR, Estimated: >60 mL/min
Glucose, Bld: 113 mg/dL — ABNORMAL HIGH (ref 70–99)
Potassium: 4.3 mmol/L (ref 3.5–5.1)
Sodium: 137 mmol/L (ref 135–145)

## 2024-04-19 MED ORDER — LEVOFLOXACIN 750 MG PO TABS: 750.0000 mg | ORAL_TABLET | Freq: Every day | ORAL | 0 refills | Status: AC

## 2024-04-19 MED ORDER — ACETAMINOPHEN 325 MG PO TABS: 650.0000 mg | ORAL_TABLET | Freq: Four times a day (QID) | ORAL | Status: AC | PRN

## 2024-04-19 NOTE — Discharge Summary (Addendum)
 "    Physician Discharge Summary   Patient: Christopher Mills MRN: [id number]  DOB: Feb 29, 1961   Admit:     Date of Admission: 04/16/2024 Admitted from: home   Discharge: Date of discharge: 04/19/24 Disposition: Home Condition at discharge: good  CODE STATUS: FULL CODE     Discharge Physician: Laneta Blunt, DO [facility]     PCP: Pcp, No  Recommendations for Outpatient Follow-up:  Follow up with urology as directed no later than 2 weeks    Discharge Instructions     Increase activity slowly   Complete by: As directed          Discharge Diagnoses: Principal Problem:   Acute pyelonephritis Active Problems:   High grade B-cell lymphoma [facility])       Hospital Course: Christopher Mills is a pleasant 64 y.o. 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 Twylla on 03/12/2024), HTN, B-cell lymphoma treated with cyclophosphamide , history of recurrent urinary tract infection who came to ED feeling tired all the time.   HPI: Patient stated that since she had a cystoscopy he had multiple problems including but not limited to burning and difficulty urination, generalized weakness and lightheadedness. When he was checking blood pressure at home, reports heart rate was high and BP was low. Has not been able to receive any chemotherapy in the month of January. He saw Dr. Twylla for postop follow-up on 03/28/2024, at which point he reported dysuria, urethral burning, and intermittent gross hematuria. He was started on empiric cefuroxime , but urine culture was ultimately negative. He states his symptoms initially improved on antibiotics, however over the past week he has had progressive irritative voiding symptoms, hypotension, and  fatigue.  Hospital course / significant events: 02/02: to ED. BP 88/37, HR 134, WBC 2.8, BUN 31, Cr 2.21, UA concern for UTI, CT abd/pelv (+)cystitis, pyelonephritis, possible prostate abscess. Urology consult - advise continue abx. Admitted to hospitalist  02/03: clinically improving. Per urology, prostatic fluid collection is too small to warrant percutaneous drainage or transurethral unroofing. We recommend IV antibiotics and consideration of repeat imaging if he clinically declines --> recommend discharge on tissue penetrating antibiotics such as Bactrim , Cipro , or Levaquin  with plans for outpatient urology follow-up in about 2 weeks  02/04: UCx no growth, BCx remain neg also. Complete 72h IV abx today and anticipate dc home tomorrow on po abx  02/05: renal fxn remains appropriate, pt ok to go home on po levaquin       Consultants:  Urology  Procedures/Surgeries: none      ASSESSMENT & PLAN:   Questionable UTI - culture negative Questionable Pyelonephritis - culture negative but inflammatory findings on imaging  Prostate abscess Sepsis due to above Urine culture 04/16/24 no growth Blood cultures 04/16/24 no growth x2d IV ceftriaxone  (have d/c cefepime , vanc, metronidazole ) --> consdier for DC on Bactrim , Cipro , or Levaquin   Urology consult --> no surgery Outpatient urology follow-up in about 2 weeks repeat CT pelvis with contrast prior to urology appt If he still having gross hematuria at that time, we will plan to refer him for hyperbaric oxygen treatments.   Dysuria Stop Pyridium  due to inefficacy; outpatient Uribel  sent to his preferred pharmacy for symptom relief at home   AKI: resolved improving from 2.21 --> 1.96 --> 1.21 today w/ GFR >60 Suspect dehydration  as cause  Baseline Cr 1.11 IV fluids can DC monitor BMP Avoid nephrotoxic drugs   Anemia Likely hemodilutional w/ significant IV fluid resuscitation  Monitor CBC Hematuria no passing clots or gross blood    High-grade lymphoma Follow-up as outpatient    Hypotension/tachycardia d/t sepsis: resolved DC home antihypertensives     Underweight based on BMI: Body mass index is 18.38 kg/m.SABRA Significantly low or high BMI is associated with higher medical risk.  Underweight - under 18  overweight - 25 to 29 obese - 30 or more Class 1 obesity: BMI of 30.0 to 34 Class 2 obesity: BMI of 35.0 to 39 Class 3 obesity: BMI of 40.0 to 49 Super Morbid Obesity: BMI 50-59 Super-super Morbid Obesity: BMI 60+ Healthy nutrition and physical activity advised as adjunct to other disease management and risk reduction treatments               Discharge Instructions  Allergies as of 04/19/2024   No Known Allergies      Medication List     STOP taking these medications    lisinopril  10 MG tablet Commonly known as: ZESTRIL    OLANZapine  5 MG tablet Commonly known as: ZYPREXA        TAKE these medications    acetaminophen  325 MG tablet Commonly known as: TYLENOL  Take 2 tablets (650 mg total) by mouth every 6 (six) hours as needed for mild pain (pain score 1-3) or fever (or Fever >/= 101).   ascorbic acid  500 MG tablet Commonly known as: VITAMIN C  Take 500 mg by mouth daily.   calcium citrate 950 (200 Ca) MG tablet Commonly known as: CALCITRATE - dosed in mg elemental calcium Take 200 mg of elemental calcium by mouth daily.   cholecalciferol  25 MCG (1000 UNIT) tablet Commonly known as: VITAMIN D3 Take 1,000 Units by mouth daily.   cyanocobalamin 1000 MCG tablet Commonly known as: VITAMIN B12 Take 1,000 mcg by mouth daily.   ferrous sulfate 325 (65 FE) MG EC tablet Take 325 mg by mouth 3 (three) times daily with meals.   FISH OIL ADULT GUMMIES PO Take 1 tablet by mouth daily at 6 (six) AM.   levofloxacin  750 MG tablet Commonly known as: Levaquin  Take 1 tablet (750 mg total) by mouth daily for 10 days. Stop sooner if directed by urology   lidocaine  5 %  ointment Commonly known as: XYLOCAINE  Apply 1 Application topically 3 (three) times daily as needed.   Na Sulfate-K Sulfate-Mg Sulfate concentrate 17.5-3.13-1.6 GM/177ML Soln Commonly known as: SUPREP   oxybutynin  5 MG tablet Commonly known as: DITROPAN  Take 1 tablet (5 mg total) by mouth every 8 (eight) hours as needed for bladder spasms.   oxyCODONE -acetaminophen  5-325 MG tablet Commonly known as: PERCOCET/ROXICET Take 1 tablet by mouth every 4 (four) hours as needed for severe pain (pain score 7-10).   potassium chloride  10 MEQ CR capsule Commonly known as: MICRO-K  Take 10 mEq by mouth daily.   Uribel  118 MG Caps Take 1 capsule (118 mg total) by mouth 4 (four) times daily as needed.   zinc sulfate (50mg  elemental zinc) 220 (50 Zn) MG capsule Take 220 mg by mouth daily.          Allergies[1]   Subjective: pt reports still some hematuria w/ passage small clots today and some dysuria, reports concern about occasional low BP at home but his BP has been ok past couple days here, he is ambulating to the bathroom and back independently. No CP/SOB,  no fever/chills, no abdominal/rectal pain    Discharge Exam: BP 111/70 (BP Location: Left Arm)   Pulse 84   Temp 98.2 F (36.8 C)   Resp 17   Ht 6' 1 (1.854 m)   Wt 63.2 kg   SpO2 100%   BMI 18.38 kg/m  General: Pt is alert, awake, not in acute distress Cardiovascular: RRR, S1/S2 +, no rubs, no gallops Respiratory: CTA bilaterally, no wheezing, no rhonchi Abdominal: Soft, NT, ND, bowel sounds + Extremities: no edema, no cyanosis     The results of significant diagnostics from this hospitalization (including imaging, microbiology, ancillary and laboratory) are listed below for reference.     Microbiology: Recent Results (from the past 240 hours)  Urine culture     Status: None   Collection Time: 04/10/24  3:52 PM   Specimen: Urine, Clean Catch  Result Value Ref Range Status   Specimen Description   Final     URINE, CLEAN CATCH Performed at [facility], [address] [place], [place] [address]    Special Requests   Final    NONE Performed at [facility], [address] [place], [place] [address]    Culture   Final    NO GROWTH Performed at [facility], [address] [place], [place] [address]    Report Status 04/11/2024 FINAL  Final  Blood Culture (routine x 2)     Status: None (Preliminary result)   Collection Time: 04/16/24  2:04 PM   Specimen: BLOOD  Result Value Ref Range Status   Specimen Description BLOOD BLOOD RIGHT FOREARM  Final   Special Requests   Final    BOTTLES DRAWN AEROBIC AND ANAEROBIC Blood Culture results may not be optimal due to an inadequate volume of blood received in culture bottles   Culture   Final    NO GROWTH 3 DAYS Performed at [facility], [address], [place], [place] [address]    Report Status PENDING  Incomplete  Blood Culture (routine x 2)     Status: None (Preliminary result)   Collection Time: 04/16/24  2:14 PM   Specimen: BLOOD  Result Value Ref Range Status   Specimen Description BLOOD BLOOD LEFT FOREARM  Final   Special Requests   Final    BOTTLES DRAWN AEROBIC AND ANAEROBIC Blood Culture adequate volume   Culture   Final    NO GROWTH 3 DAYS Performed at [facility], [address], [place], [place] [address]    Report Status PENDING  Incomplete  Resp panel by RT-PCR (RSV, Flu A&B, Covid) Anterior Nasal Swab     Status: None   Collection Time: 04/16/24  2:16 PM   Specimen: Anterior Nasal Swab  Result Value Ref Range Status   SARS Coronavirus 2 by RT PCR NEGATIVE NEGATIVE Final    Comment: (NOTE) SARS-CoV-2 target nucleic acids are NOT DETECTED.  The SARS-CoV-2 RNA is generally detectable in upper respiratory specimens during the acute phase of infection. The lowest concentration of SARS-CoV-2 viral copies this assay can detect is 138 copies/mL. A negative result does not preclude  SARS-Cov-2 infection and should not be used as the sole basis for treatment or other patient management decisions. A negative result may occur with  improper specimen collection/handling, submission of specimen other than nasopharyngeal swab, presence of viral mutation(s) within the areas targeted by this assay, and inadequate number of viral copies(<138 copies/mL). A negative result must be combined with clinical observations,  patient history, and epidemiological information. The expected result is Negative.  Fact Sheet for Patients:  bloggercourse.com  Fact Sheet for Healthcare Providers:  seriousbroker.it  This test is no t yet approved or cleared by the [place]  FDA and  has been authorized for detection and/or diagnosis of SARS-CoV-2 by FDA under an Emergency Use Authorization (EUA). This EUA will remain  in effect (meaning this test can be used) for the duration of the COVID-19 declaration under Section 564(b)(1) of the Act, 21 U.S.C.section 360bbb-3(b)(1), unless the authorization is terminated  or revoked sooner.       Influenza A by PCR NEGATIVE NEGATIVE Final   Influenza B by PCR NEGATIVE NEGATIVE Final    Comment: (NOTE) The Xpert Xpress SARS-CoV-2/FLU/RSV plus assay is intended as an aid in the diagnosis of influenza from Nasopharyngeal swab specimens and should not be used as a sole basis for treatment. Nasal washings and aspirates are unacceptable for Xpert Xpress SARS-CoV-2/FLU/RSV testing.  Fact Sheet for Patients: bloggercourse.com  Fact Sheet for Healthcare Providers: seriousbroker.it  This test is not yet approved or cleared by the [place]  FDA and has been authorized for detection and/or diagnosis of SARS-CoV-2 by FDA under an Emergency Use Authorization (EUA). This EUA will remain in effect (meaning this test can be used) for the duration of  the COVID-19 declaration under Section 564(b)(1) of the Act, 21 U.S.C. section 360bbb-3(b)(1), unless the authorization is terminated or revoked.     Resp Syncytial Virus by PCR NEGATIVE NEGATIVE Final    Comment: (NOTE) Fact Sheet for Patients: bloggercourse.com  Fact Sheet for Healthcare Providers: seriousbroker.it  This test is not yet approved or cleared by the [place]  FDA and has been authorized for detection and/or diagnosis of SARS-CoV-2 by FDA under an Emergency Use Authorization (EUA). This EUA will remain in effect (meaning this test can be used) for the duration of the COVID-19 declaration under Section 564(b)(1) of the Act, 21 U.S.C. section 360bbb-3(b)(1), unless the authorization is terminated or revoked.  Performed at [facility], [address], [place], [place] [address]   Urine Culture     Status: None   Collection Time: 04/16/24  3:45 PM   Specimen: Urine, Clean Catch  Result Value Ref Range Status   Specimen Description   Final    URINE, CLEAN CATCH Performed at [facility], [address], [place], [place] [address]    Special Requests   Final    NONE Performed at [facility], [address], [place], [place] [address]    Culture   Final    NO GROWTH Performed at [facility], [address], [place], [place] [address]    Report Status 04/17/2024 FINAL  Final     Labs: BNP (last 3 results) No results for input(s): BNP in the last 8760 hours. Basic Metabolic Panel: Recent Labs  Lab 04/16/24 1404 04/16/24 1631 04/18/24 0414 04/19/24 1130  NA 133*  --  138 137  K 4.5  --  4.3 4.3  CL 95*  --  105 104  CO2 21*  --  20* 23  GLUCOSE 102*  --  86 113*  BUN 31*  --  18 15  CREATININE 2.21* 1.96* 1.21 1.25*  CALCIUM 10.5*  --  9.2 9.5   Liver Function Tests: Recent Labs  Lab 04/16/24 1404  AST 72*  ALT 42  ALKPHOS 151*  BILITOT 0.6  PROT  7.6  ALBUMIN 3.8   No  results for input(s): LIPASE, AMYLASE in the last 168 hours. No results for input(s): AMMONIA in the last 168 hours. CBC: Recent Labs  Lab 04/16/24 1404 04/16/24 1631 04/19/24 1130  WBC 2.8* 2.7* 2.3*  HGB 11.0* 8.5* 8.8*  HCT 34.2* 26.0* 28.0*  MCV 86.4 85.5 87.5  PLT 281 187 200   Cardiac Enzymes: No results for input(s): CKTOTAL, CKMB, CKMBINDEX, TROPONINI in the last 168 hours. BNP: Invalid input(s): POCBNP CBG: No results for input(s): GLUCAP in the last 168 hours. D-Dimer No results for input(s): DDIMER in the last 72 hours. Hgb A1c No results for input(s): HGBA1C in the last 72 hours. Lipid Profile No results for input(s): CHOL, HDL, LDLCALC, TRIG, CHOLHDL, LDLDIRECT in the last 72 hours. Thyroid  function studies No results for input(s): TSH, T4TOTAL, T3FREE, THYROIDAB in the last 72 hours.  Invalid input(s): FREET3 Anemia work up No results for input(s): VITAMINB12, FOLATE, FERRITIN, TIBC, IRON, RETICCTPCT in the last 72 hours. Urinalysis    Component Value Date/Time   COLORURINE ORANGE (A) 04/16/2024 1500   APPEARANCEUR CLOUDY (A) 04/16/2024 1500   APPEARANCEUR Slightly cloudy 03/28/2024 1331   LABSPEC 1.016 04/16/2024 1500   PHURINE 8.0 04/16/2024 1500   GLUCOSEU NEGATIVE 04/16/2024 1500   HGBUR LARGE (A) 04/16/2024 1500   BILIRUBINUR NEGATIVE 04/16/2024 1500   BILIRUBINUR Negative 03/28/2024 1331   KETONESUR NEGATIVE 04/16/2024 1500   PROTEINUR 100 (A) 04/16/2024 1500   UROBILINOGEN 0.2 01/17/2024 1342   NITRITE NEGATIVE 04/16/2024 1500   LEUKOCYTESUR LARGE (A) 04/16/2024 1500   Sepsis Labs Recent Labs  Lab 04/16/24 1404 04/16/24 1631 04/19/24 1130  WBC 2.8* 2.7* 2.3*   Microbiology Recent Results (from the past 240 hours)  Urine culture     Status: None   Collection Time: 04/10/24  3:52 PM   Specimen: Urine, Clean Catch  Result Value Ref Range Status   Specimen  Description   Final    URINE, CLEAN CATCH Performed at [facility], [address] [place], [place] [address]    Special Requests   Final    NONE Performed at [facility], [address], [place], [place] [address]    Culture   Final    NO GROWTH Performed at [facility], [address], [place], [place] [address]    Report Status 04/11/2024 FINAL  Final  Blood Culture (routine x 2)     Status: None (Preliminary result)   Collection Time: 04/16/24  2:04 PM   Specimen: BLOOD  Result Value Ref Range Status   Specimen Description BLOOD BLOOD RIGHT FOREARM  Final   Special Requests   Final    BOTTLES DRAWN AEROBIC AND ANAEROBIC Blood Culture results may not be optimal due to an inadequate volume of blood received in culture bottles   Culture   Final    NO GROWTH 3 DAYS Performed at [facility], [address] [place], [place] [address]    Report Status PENDING  Incomplete  Blood Culture (routine x 2)     Status: None (Preliminary result)   Collection Time: 04/16/24  2:14 PM   Specimen: BLOOD  Result Value Ref Range Status   Specimen Description BLOOD BLOOD LEFT FOREARM  Final   Special Requests   Final    BOTTLES DRAWN AEROBIC AND ANAEROBIC Blood Culture adequate volume   Culture   Final    NO GROWTH 3 DAYS Performed at [facility], [address] [place], [place] [address]  Report Status PENDING  Incomplete  Resp panel by RT-PCR (RSV, Flu A&B, Covid) Anterior Nasal Swab     Status: None   Collection Time: 04/16/24  2:16 PM   Specimen: Anterior Nasal Swab  Result Value Ref Range Status   SARS Coronavirus 2 by RT PCR NEGATIVE NEGATIVE Final    Comment: (NOTE) SARS-CoV-2 target nucleic acids are NOT DETECTED.  The SARS-CoV-2 RNA is generally detectable in upper respiratory specimens during the acute phase of infection. The lowest concentration of SARS-CoV-2 viral copies this assay can detect is 138 copies/mL. A negative  result does not preclude SARS-Cov-2 infection and should not be used as the sole basis for treatment or other patient management decisions. A negative result may occur with  improper specimen collection/handling, submission of specimen other than nasopharyngeal swab, presence of viral mutation(s) within the areas targeted by this assay, and inadequate number of viral copies(<138 copies/mL). A negative result must be combined with clinical observations, patient history, and epidemiological information. The expected result is Negative.  Fact Sheet for Patients:  bloggercourse.com  Fact Sheet for Healthcare Providers:  seriousbroker.it  This test is no t yet approved or cleared by the [place]  FDA and  has been authorized for detection and/or diagnosis of SARS-CoV-2 by FDA under an Emergency Use Authorization (EUA). This EUA will remain  in effect (meaning this test can be used) for the duration of the COVID-19 declaration under Section 564(b)(1) of the Act, 21 U.S.C.section 360bbb-3(b)(1), unless the authorization is terminated  or revoked sooner.       Influenza A by PCR NEGATIVE NEGATIVE Final   Influenza B by PCR NEGATIVE NEGATIVE Final    Comment: (NOTE) The Xpert Xpress SARS-CoV-2/FLU/RSV plus assay is intended as an aid in the diagnosis of influenza from Nasopharyngeal swab specimens and should not be used as a sole basis for treatment. Nasal washings and aspirates are unacceptable for Xpert Xpress SARS-CoV-2/FLU/RSV testing.  Fact Sheet for Patients: bloggercourse.com  Fact Sheet for Healthcare Providers: seriousbroker.it  This test is not yet approved or cleared by the [place]  FDA and has been authorized for detection and/or diagnosis of SARS-CoV-2 by FDA under an Emergency Use Authorization (EUA). This EUA will remain in effect (meaning this test can be used)  for the duration of the COVID-19 declaration under Section 564(b)(1) of the Act, 21 U.S.C. section 360bbb-3(b)(1), unless the authorization is terminated or revoked.     Resp Syncytial Virus by PCR NEGATIVE NEGATIVE Final    Comment: (NOTE) Fact Sheet for Patients: bloggercourse.com  Fact Sheet for Healthcare Providers: seriousbroker.it  This test is not yet approved or cleared by the [place]  FDA and has been authorized for detection and/or diagnosis of SARS-CoV-2 by FDA under an Emergency Use Authorization (EUA). This EUA will remain in effect (meaning this test can be used) for the duration of the COVID-19 declaration under Section 564(b)(1) of the Act, 21 U.S.C. section 360bbb-3(b)(1), unless the authorization is terminated or revoked.  Performed at [facility], [address], [place], [place] [address]   Urine Culture     Status: None   Collection Time: 04/16/24  3:45 PM   Specimen: Urine, Clean Catch  Result Value Ref Range Status   Specimen Description   Final    URINE, CLEAN CATCH Performed at [facility], [address] [place], [place] [address]    Special Requests   Final    NONE Performed at [facility], [address]  [place], [place] [address]    Culture   Final    NO GROWTH Performed at [facility], [address] [place], [place] [address]    Report Status 04/17/2024 FINAL  Final   Imaging CT ABDOMEN PELVIS W CONTRAST Result Date: 04/16/2024 EXAM: CT ABDOMEN AND PELVIS WITH CONTRAST 04/16/2024 02:58:57 PM TECHNIQUE: CT of the abdomen and pelvis was performed with the administration of 80 mL of iohexol  (OMNIPAQUE ) 300 MG/ML solution. Multiplanar reformatted images are provided for review. Automated exposure control, iterative reconstruction, and/or weight-based adjustment of the mA/kV was utilized to reduce the radiation dose to as low as reasonably achievable.  COMPARISON: 01/02/2024. CLINICAL HISTORY: Sepsis, recent surgery, no clear source. FINDINGS: LOWER CHEST: No acute abnormality. LIVER: The liver is unremarkable. GALLBLADDER AND BILE DUCTS: Gallbladder is unremarkable. No biliary ductal dilatation. SPLEEN: No acute abnormality. PANCREAS: No acute abnormality. ADRENAL GLANDS: No acute abnormality. KIDNEYS, URETERS AND BLADDER: Subtle hypoattenuation of the upper pole of the right kidney. There is diffuse wall thickening and urothelial enhancement throughout the renal collecting systems bilaterally. Severe wall thickening and mucosal enhancement of the urinary bladder. No stones in the kidneys or ureters. No hydronephrosis. No perinephric or periureteral stranding. GI AND BOWEL: The appendix was not visualized. No pericecal or right lower quadrant inflammatory changes to suggest acute appendicitis. Stomach demonstrates no acute abnormality. There is no bowel obstruction. PERITONEUM AND RETROPERITONEUM: No ascites. No free air. VASCULATURE: Diffuse aortoiliac atherosclerosis. Aorta is normal in caliber. LYMPH NODES: No lymphadenopathy. REPRODUCTIVE ORGANS: In the left prostate gland, there is a hypodense collection measuring 1 cm (axial 68). BONES AND SOFT TISSUES: Mild multilevel degenerative disc disease of the spine. No acute osseous abnormality. No focal soft tissue abnormality. IMPRESSION: 1. Findings consistent with acute cystitis, bilateral ureteritis and pyelitis, and possible right sided pyelonephritis. Correlation with urinalysis recommended. 2. Small fluid collection in the left side of the prostate gland measuring 1 cm, worrisome for prostatitis and possibly a prostatic abscess. 3. Small hypodense collection in the left prostate gland measuring 1 cm, suspicious for prostatic abscess. 4. Right renal upper pole hypoattenuation, suspicious for acute pyelonephritis. Electronically signed by: Rogelia Myers MD 04/16/2024 03:25 PM EST RP Workstation: [id number]    DG Chest Port 1 View Result Date: 04/16/2024 CLINICAL DATA:  Questionable sepsis. EXAM: PORTABLE CHEST 1 VIEW COMPARISON:  February 29, 2024 FINDINGS: There is stable right-sided venous Port-A-Cath positioning. The heart size and mediastinal contours are within normal limits. Both lungs are clear. Surgical clips are seen overlying the supraclavicular region on the right. Multilevel degenerative changes are present throughout the thoracic spine. IMPRESSION: No active cardiopulmonary disease. Electronically Signed   By: Suzen Dials M.D.   On: 04/16/2024 14:22      Time coordinating discharge: over 30 minutes  SIGNED:  Javan Gonzaga DO [facility]       [1] No Known Allergies  "

## 2024-04-19 NOTE — TOC Progression Note (Signed)
 Transition of Care [facility]) - Progression Note    Patient Details  Name: ADOLF ORMISTON MRN: [id number] Date of Birth: 12/12/1960  [facility]) CM/SW Contact  Grayce JAYSON Perfect, RN Phone Number: 04/19/2024, 3:36 PM  Clinical Narrative:      Patient is discharging today, will discharge on po antibiotics.  No TOC needs                    Expected Discharge Plan and Services         Expected Discharge Date: 04/19/24                                     Social Drivers of Health (SDOH) Interventions SDOH Screenings   Food Insecurity: No Food Insecurity (04/17/2024)  Housing: Low Risk (04/17/2024)  Transportation Needs: No Transportation Needs (04/17/2024)  Utilities: Not At Risk (04/17/2024)  Depression (PHQ2-9): Low Risk (04/10/2024)  Financial Resource Strain: Medium Risk (07/30/2021)   Received from [facility]  Social Connections: Socially Isolated (04/17/2024)  Stress: No Stress Concern Present (07/30/2021)   Received from [facility]  Tobacco Use: Medium Risk (04/17/2024)  Health Literacy: Low Risk (07/30/2021)   Received from [facility]    Readmission Risk Interventions     No data to display

## 2024-04-19 NOTE — Plan of Care (Signed)

## 2024-04-20 ENCOUNTER — Inpatient Hospital Stay: Admitting: Oncology

## 2024-04-20 ENCOUNTER — Inpatient Hospital Stay: Admitting: Hospice and Palliative Medicine

## 2024-04-20 LAB — CULTURE, BLOOD (ROUTINE X 2)
Culture: NO GROWTH
Culture: NO GROWTH
Special Requests: ADEQUATE

## 2024-04-24 ENCOUNTER — Other Ambulatory Visit: Payer: Medicaid Other

## 2024-05-01 ENCOUNTER — Ambulatory Visit

## 2024-05-02 ENCOUNTER — Ambulatory Visit: Admitting: Physician Assistant

## 2024-05-07 ENCOUNTER — Ambulatory Visit: Admitting: Cardiology

## 2024-05-08 ENCOUNTER — Inpatient Hospital Stay: Admitting: Oncology

## 2024-05-08 ENCOUNTER — Ambulatory Visit: Admitting: Urology

## 2024-05-08 ENCOUNTER — Inpatient Hospital Stay: Admitting: Hospice and Palliative Medicine

## 2024-05-18 ENCOUNTER — Ambulatory Visit: Admit: 2024-05-18 | Admitting: Gastroenterology

## 2024-05-18 SURGERY — COLONOSCOPY
Anesthesia: General
